# Patient Record
Sex: Female | Born: 1940
Health system: Southern US, Community
[De-identification: ages and names within clinical notes are randomized; demographics above are authoritative.]

## PROBLEM LIST (undated history)

## (undated) DIAGNOSIS — K219 Gastro-esophageal reflux disease without esophagitis: Secondary | ICD-10-CM

## (undated) DIAGNOSIS — K529 Noninfective gastroenteritis and colitis, unspecified: Secondary | ICD-10-CM

## (undated) DIAGNOSIS — I10 Essential (primary) hypertension: Secondary | ICD-10-CM

## (undated) DIAGNOSIS — R112 Nausea with vomiting, unspecified: Secondary | ICD-10-CM

## (undated) DIAGNOSIS — M810 Age-related osteoporosis without current pathological fracture: Secondary | ICD-10-CM

## (undated) DIAGNOSIS — G459 Transient cerebral ischemic attack, unspecified: Secondary | ICD-10-CM

## (undated) DIAGNOSIS — E0590 Thyrotoxicosis, unspecified without thyrotoxic crisis or storm: Secondary | ICD-10-CM

## (undated) DIAGNOSIS — M1990 Unspecified osteoarthritis, unspecified site: Secondary | ICD-10-CM

## (undated) DIAGNOSIS — Z955 Presence of coronary angioplasty implant and graft: Secondary | ICD-10-CM

## (undated) DIAGNOSIS — N6019 Diffuse cystic mastopathy of unspecified breast: Secondary | ICD-10-CM

## (undated) DIAGNOSIS — E7800 Pure hypercholesterolemia, unspecified: Secondary | ICD-10-CM

## (undated) DIAGNOSIS — I2510 Atherosclerotic heart disease of native coronary artery without angina pectoris: Secondary | ICD-10-CM

## (undated) DIAGNOSIS — I219 Acute myocardial infarction, unspecified: Secondary | ICD-10-CM

## (undated) DIAGNOSIS — T8859XA Other complications of anesthesia, initial encounter: Secondary | ICD-10-CM

## (undated) DIAGNOSIS — Z87442 Personal history of urinary calculi: Secondary | ICD-10-CM

## (undated) DIAGNOSIS — E785 Hyperlipidemia, unspecified: Secondary | ICD-10-CM

## (undated) DIAGNOSIS — J3089 Other allergic rhinitis: Secondary | ICD-10-CM

## (undated) DIAGNOSIS — T7840XA Allergy, unspecified, initial encounter: Secondary | ICD-10-CM

## (undated) DIAGNOSIS — R011 Cardiac murmur, unspecified: Secondary | ICD-10-CM

## (undated) DIAGNOSIS — D649 Anemia, unspecified: Secondary | ICD-10-CM

## (undated) DIAGNOSIS — K5792 Diverticulitis of intestine, part unspecified, without perforation or abscess without bleeding: Secondary | ICD-10-CM

## (undated) DIAGNOSIS — Z9889 Other specified postprocedural states: Secondary | ICD-10-CM

## (undated) DIAGNOSIS — E079 Disorder of thyroid, unspecified: Secondary | ICD-10-CM

## (undated) DIAGNOSIS — T4145XA Adverse effect of unspecified anesthetic, initial encounter: Secondary | ICD-10-CM

## (undated) HISTORY — DX: Hyperlipidemia, unspecified: E78.5

## (undated) HISTORY — DX: Gastro-esophageal reflux disease without esophagitis: K21.9

## (undated) HISTORY — DX: Disorder of thyroid, unspecified: E07.9

## (undated) HISTORY — PX: BREAST IMPLANT REMOVAL: SUR1101

## (undated) HISTORY — PX: TRANSUMBILICAL AUGMENTATION MAMMAPLASTY: SUR838

## (undated) HISTORY — DX: Essential (primary) hypertension: I10

## (undated) HISTORY — PX: MASTECTOMY: SHX3

## (undated) HISTORY — DX: Age-related osteoporosis without current pathological fracture: M81.0

## (undated) HISTORY — DX: Allergy, unspecified, initial encounter: T78.40XA

## (undated) HISTORY — PX: VASCULAR SURGERY: SHX849

---

## 1982-05-31 HISTORY — PX: BREAST SURGERY: SHX581

## 1997-05-31 HISTORY — PX: CORONARY ANGIOPLASTY WITH STENT PLACEMENT: SHX49

## 1998-01-31 ENCOUNTER — Ambulatory Visit (HOSPITAL_COMMUNITY): Admission: RE | Admit: 1998-01-31 | Discharge: 1998-01-31 | Payer: Self-pay | Admitting: *Deleted

## 1999-01-30 ENCOUNTER — Ambulatory Visit (HOSPITAL_COMMUNITY): Admission: RE | Admit: 1999-01-30 | Discharge: 1999-01-30 | Payer: Self-pay | Admitting: *Deleted

## 2000-01-29 ENCOUNTER — Ambulatory Visit (HOSPITAL_COMMUNITY): Admission: RE | Admit: 2000-01-29 | Discharge: 2000-01-29 | Payer: Self-pay | Admitting: *Deleted

## 2000-09-16 ENCOUNTER — Encounter: Payer: Self-pay | Admitting: Chiropractic Medicine

## 2000-09-16 ENCOUNTER — Ambulatory Visit (HOSPITAL_COMMUNITY): Admission: RE | Admit: 2000-09-16 | Discharge: 2000-09-16 | Payer: Self-pay | Admitting: Chiropractic Medicine

## 2000-10-26 ENCOUNTER — Encounter: Payer: Self-pay | Admitting: Neurosurgery

## 2000-10-26 ENCOUNTER — Encounter: Admission: RE | Admit: 2000-10-26 | Discharge: 2000-10-26 | Payer: Self-pay | Admitting: Neurosurgery

## 2000-11-14 ENCOUNTER — Encounter: Payer: Self-pay | Admitting: Neurosurgery

## 2000-11-14 ENCOUNTER — Encounter: Admission: RE | Admit: 2000-11-14 | Discharge: 2000-11-14 | Payer: Self-pay | Admitting: Neurosurgery

## 2001-01-27 ENCOUNTER — Ambulatory Visit (HOSPITAL_COMMUNITY): Admission: RE | Admit: 2001-01-27 | Discharge: 2001-01-27 | Payer: Self-pay | Admitting: *Deleted

## 2002-01-26 ENCOUNTER — Ambulatory Visit (HOSPITAL_COMMUNITY): Admission: RE | Admit: 2002-01-26 | Discharge: 2002-01-26 | Payer: Self-pay | Admitting: *Deleted

## 2002-05-31 HISTORY — PX: BREAST ENHANCEMENT SURGERY: SHX7

## 2003-01-25 ENCOUNTER — Ambulatory Visit (HOSPITAL_COMMUNITY): Admission: RE | Admit: 2003-01-25 | Discharge: 2003-01-25 | Payer: Self-pay | Admitting: *Deleted

## 2003-04-09 ENCOUNTER — Other Ambulatory Visit: Payer: Self-pay

## 2003-06-13 ENCOUNTER — Other Ambulatory Visit: Payer: Self-pay

## 2003-08-30 HISTORY — PX: RENAL ARTERY STENT: SHX2321

## 2003-08-30 HISTORY — PX: FEMORAL ARTERY STENT: SHX1583

## 2003-09-18 ENCOUNTER — Ambulatory Visit (HOSPITAL_COMMUNITY): Admission: RE | Admit: 2003-09-18 | Discharge: 2003-09-19 | Payer: Self-pay | Admitting: *Deleted

## 2003-10-30 ENCOUNTER — Ambulatory Visit (HOSPITAL_COMMUNITY): Admission: RE | Admit: 2003-10-30 | Discharge: 2003-10-31 | Payer: Self-pay | Admitting: *Deleted

## 2005-05-31 HISTORY — PX: ABDOMINAL HYSTERECTOMY: SHX81

## 2005-09-08 ENCOUNTER — Ambulatory Visit: Payer: Self-pay | Admitting: Obstetrics and Gynecology

## 2006-05-31 HISTORY — PX: HERNIA REPAIR: SHX51

## 2006-12-09 ENCOUNTER — Ambulatory Visit: Payer: Self-pay | Admitting: Surgery

## 2007-02-08 ENCOUNTER — Ambulatory Visit: Payer: Self-pay | Admitting: Family Medicine

## 2008-10-29 DIAGNOSIS — K219 Gastro-esophageal reflux disease without esophagitis: Secondary | ICD-10-CM | POA: Insufficient documentation

## 2008-10-29 DIAGNOSIS — M810 Age-related osteoporosis without current pathological fracture: Secondary | ICD-10-CM | POA: Insufficient documentation

## 2008-10-29 DIAGNOSIS — I10 Essential (primary) hypertension: Secondary | ICD-10-CM | POA: Insufficient documentation

## 2008-10-29 DIAGNOSIS — D649 Anemia, unspecified: Secondary | ICD-10-CM | POA: Insufficient documentation

## 2008-10-29 DIAGNOSIS — I2510 Atherosclerotic heart disease of native coronary artery without angina pectoris: Secondary | ICD-10-CM | POA: Insufficient documentation

## 2009-01-27 DIAGNOSIS — T859XXA Unspecified complication of internal prosthetic device, implant and graft, initial encounter: Secondary | ICD-10-CM | POA: Insufficient documentation

## 2009-01-27 HISTORY — DX: Unspecified complication of internal prosthetic device, implant and graft, initial encounter: T85.9XXA

## 2009-07-08 ENCOUNTER — Ambulatory Visit: Payer: Self-pay | Admitting: Family Medicine

## 2009-07-14 ENCOUNTER — Ambulatory Visit: Payer: Self-pay | Admitting: Family Medicine

## 2009-07-21 ENCOUNTER — Ambulatory Visit: Payer: Self-pay | Admitting: Family Medicine

## 2009-07-29 ENCOUNTER — Ambulatory Visit: Payer: Self-pay | Admitting: Gastroenterology

## 2009-07-31 ENCOUNTER — Inpatient Hospital Stay: Payer: Self-pay | Admitting: Specialist

## 2009-09-01 ENCOUNTER — Ambulatory Visit: Payer: Self-pay | Admitting: Gastroenterology

## 2009-09-01 LAB — HM COLONOSCOPY

## 2010-02-09 ENCOUNTER — Ambulatory Visit: Payer: Self-pay | Admitting: Gastroenterology

## 2010-02-11 LAB — PATHOLOGY REPORT

## 2010-04-17 ENCOUNTER — Other Ambulatory Visit: Payer: Self-pay | Admitting: Gastroenterology

## 2010-06-16 ENCOUNTER — Emergency Department: Payer: Self-pay | Admitting: Emergency Medicine

## 2010-09-07 ENCOUNTER — Ambulatory Visit: Payer: Self-pay | Admitting: Gastroenterology

## 2010-09-29 ENCOUNTER — Ambulatory Visit: Payer: Self-pay | Admitting: Gastroenterology

## 2011-04-19 LAB — HM DEXA SCAN: HM Dexa Scan: NORMAL

## 2013-02-26 ENCOUNTER — Other Ambulatory Visit: Payer: Self-pay | Admitting: Urgent Care

## 2013-02-26 LAB — COMPREHENSIVE METABOLIC PANEL
Albumin: 4.5 g/dL (ref 3.4–5.0)
Alkaline Phosphatase: 64 U/L (ref 50–136)
Anion Gap: 3 — ABNORMAL LOW (ref 7–16)
BUN: 17 mg/dL (ref 7–18)
Bilirubin,Total: 0.5 mg/dL (ref 0.2–1.0)
Calcium, Total: 9.9 mg/dL (ref 8.5–10.1)
Chloride: 101 mmol/L (ref 98–107)
Co2: 31 mmol/L (ref 21–32)
Creatinine: 0.63 mg/dL (ref 0.60–1.30)
EGFR (African American): >60
EGFR (Non-African Amer.): >60
Glucose: 90 mg/dL (ref 65–99)
Osmolality: 271 (ref 275–301)
Potassium: 3.9 mmol/L (ref 3.5–5.1)
SGOT(AST): 30 U/L (ref 15–37)
SGPT (ALT): 28 U/L (ref 12–78)
Sodium: 135 mmol/L — ABNORMAL LOW (ref 136–145)
Total Protein: 7.8 g/dL (ref 6.4–8.2)

## 2013-02-26 LAB — CBC WITH DIFFERENTIAL/PLATELET
Basophil #: 0.1 10*3/uL (ref 0.0–0.1)
Basophil %: 1 %
Eosinophil #: 0.4 10*3/uL (ref 0.0–0.7)
Eosinophil %: 5.1 %
HCT: 45.6 % (ref 35.0–47.0)
HGB: 15.4 g/dL (ref 12.0–16.0)
Lymphocyte #: 2.3 10*3/uL (ref 1.0–3.6)
Lymphocyte %: 32.3 %
MCH: 32.1 pg (ref 26.0–34.0)
MCHC: 33.7 g/dL (ref 32.0–36.0)
MCV: 95 fL (ref 80–100)
Monocyte #: 0.5 x10 3/mm (ref 0.2–0.9)
Monocyte %: 7 %
Neutrophil #: 3.9 10*3/uL (ref 1.4–6.5)
Neutrophil %: 54.6 %
Platelet: 290 10*3/uL (ref 150–440)
RBC: 4.8 10*6/uL (ref 3.80–5.20)
RDW: 13.4 % (ref 11.5–14.5)
WBC: 7.1 10*3/uL (ref 3.6–11.0)

## 2013-04-02 ENCOUNTER — Ambulatory Visit: Payer: Self-pay | Admitting: Gastroenterology

## 2013-05-29 ENCOUNTER — Ambulatory Visit: Payer: Self-pay | Admitting: Family Medicine

## 2013-11-23 DIAGNOSIS — I2510 Atherosclerotic heart disease of native coronary artery without angina pectoris: Secondary | ICD-10-CM | POA: Insufficient documentation

## 2013-11-23 DIAGNOSIS — I739 Peripheral vascular disease, unspecified: Secondary | ICD-10-CM | POA: Insufficient documentation

## 2013-11-23 DIAGNOSIS — I219 Acute myocardial infarction, unspecified: Secondary | ICD-10-CM | POA: Insufficient documentation

## 2013-11-23 DIAGNOSIS — I252 Old myocardial infarction: Secondary | ICD-10-CM | POA: Insufficient documentation

## 2013-12-03 DIAGNOSIS — I350 Nonrheumatic aortic (valve) stenosis: Secondary | ICD-10-CM | POA: Insufficient documentation

## 2014-03-25 ENCOUNTER — Ambulatory Visit: Payer: Self-pay | Admitting: Vascular Surgery

## 2014-04-15 ENCOUNTER — Ambulatory Visit: Payer: Self-pay | Admitting: Urgent Care

## 2014-04-15 ENCOUNTER — Ambulatory Visit: Payer: Self-pay | Admitting: Internal Medicine

## 2014-05-06 ENCOUNTER — Ambulatory Visit: Payer: Self-pay | Admitting: Vascular Surgery

## 2014-05-06 LAB — CBC
HCT: 41.1 % (ref 35.0–47.0)
HGB: 13.4 g/dL (ref 12.0–16.0)
MCH: 31.8 pg (ref 26.0–34.0)
MCHC: 32.7 g/dL (ref 32.0–36.0)
MCV: 97 fL (ref 80–100)
Platelet: 288 10*3/uL (ref 150–440)
RBC: 4.23 10*6/uL (ref 3.80–5.20)
RDW: 14.4 % (ref 11.5–14.5)
WBC: 6 10*3/uL (ref 3.6–11.0)

## 2014-05-06 LAB — BASIC METABOLIC PANEL
Anion Gap: 9 (ref 7–16)
BUN: 13 mg/dL (ref 7–18)
Calcium, Total: 9.1 mg/dL (ref 8.5–10.1)
Chloride: 100 mmol/L (ref 98–107)
Co2: 30 mmol/L (ref 21–32)
Creatinine: 0.68 mg/dL (ref 0.60–1.30)
EGFR (African American): >60
EGFR (Non-African Amer.): >60
Glucose: 136 mg/dL — ABNORMAL HIGH (ref 65–99)
Osmolality: 280 (ref 275–301)
Potassium: 3.8 mmol/L (ref 3.5–5.1)
Sodium: 139 mmol/L (ref 136–145)

## 2014-05-22 ENCOUNTER — Observation Stay: Payer: Self-pay | Admitting: Internal Medicine

## 2014-05-22 LAB — CBC WITH DIFFERENTIAL/PLATELET
Basophil #: 0 10*3/uL (ref 0.0–0.1)
Basophil %: 0.4 %
Eosinophil #: 0.4 10*3/uL (ref 0.0–0.7)
Eosinophil %: 5.1 %
HCT: 44.4 % (ref 35.0–47.0)
HGB: 14.7 g/dL (ref 12.0–16.0)
Lymphocyte #: 0.6 10*3/uL — ABNORMAL LOW (ref 1.0–3.6)
Lymphocyte %: 7.1 %
MCH: 31.8 pg (ref 26.0–34.0)
MCHC: 33.1 g/dL (ref 32.0–36.0)
MCV: 96 fL (ref 80–100)
Monocyte #: 0.3 x10 3/mm (ref 0.2–0.9)
Monocyte %: 3.8 %
Neutrophil #: 7.1 10*3/uL — ABNORMAL HIGH (ref 1.4–6.5)
Neutrophil %: 83.6 %
Platelet: 228 10*3/uL (ref 150–440)
RBC: 4.63 10*6/uL (ref 3.80–5.20)
RDW: 14.2 % (ref 11.5–14.5)
WBC: 8.5 10*3/uL (ref 3.6–11.0)

## 2014-05-22 LAB — URINALYSIS, COMPLETE
BILIRUBIN, UR: NEGATIVE
Bacteria: NONE SEEN
Glucose,UR: NEGATIVE mg/dL (ref 0–75)
Nitrite: NEGATIVE
Ph: 5 (ref 4.5–8.0)
Specific Gravity: 1.024 (ref 1.003–1.030)
Squamous Epithelial: 6
WBC UR: 8 /HPF (ref 0–5)

## 2014-05-22 LAB — BASIC METABOLIC PANEL
Anion Gap: 9 (ref 7–16)
BUN: 17 mg/dL (ref 7–18)
Calcium, Total: 8.7 mg/dL (ref 8.5–10.1)
Chloride: 97 mmol/L — ABNORMAL LOW (ref 98–107)
Co2: 28 mmol/L (ref 21–32)
Creatinine: 0.83 mg/dL (ref 0.60–1.30)
EGFR (African American): >60
EGFR (Non-African Amer.): >60
Glucose: 121 mg/dL — ABNORMAL HIGH (ref 65–99)
Osmolality: 271 (ref 275–301)
Potassium: 3.4 mmol/L — ABNORMAL LOW (ref 3.5–5.1)
Sodium: 134 mmol/L — ABNORMAL LOW (ref 136–145)

## 2014-05-22 LAB — ED INFLUENZA
H1N1FLUPCR: NOT DETECTED
INFLBPCR: NEGATIVE
Influenza A By PCR: NEGATIVE

## 2014-05-23 LAB — TROPONIN I
Troponin-I: <0.02 ng/mL
Troponin-I: <0.02 ng/mL

## 2014-05-25 LAB — URINE CULTURE

## 2014-05-25 LAB — BETA STREP CULTURE(ARMC)

## 2014-05-27 LAB — LIPID PANEL
Cholesterol: 204 mg/dL — AB (ref 0–200)
HDL: 56 mg/dL (ref 35–70)
LDL CALC: 121 mg/dL
TRIGLYCERIDES: 135 mg/dL (ref 40–160)

## 2014-05-27 LAB — CULTURE, BLOOD (SINGLE)

## 2014-05-27 LAB — BASIC METABOLIC PANEL
BUN: 15 mg/dL (ref 4–21)
Creatinine: 0.7 mg/dL (ref <=1.1)
GLUCOSE: 86 mg/dL
POTASSIUM: 3.7 mmol/L (ref 3.4–5.3)
SODIUM: 141 mmol/L (ref 137–147)

## 2014-05-27 LAB — CBC AND DIFFERENTIAL
HCT: 41 % (ref 36–46)
HEMOGLOBIN: 14.3 g/dL (ref 12.0–16.0)
Platelets: 377 10*3/uL (ref 150–399)
WBC: 7.4 10^3/mL

## 2014-05-27 LAB — HEPATIC FUNCTION PANEL
ALT: 17 U/L (ref 7–35)
AST: 20 U/L (ref 13–35)

## 2014-06-17 ENCOUNTER — Ambulatory Visit: Payer: Self-pay | Admitting: Vascular Surgery

## 2014-06-17 LAB — CBC
HCT: 42 % (ref 35.0–47.0)
HGB: 13.9 g/dL (ref 12.0–16.0)
MCH: 32.1 pg (ref 26.0–34.0)
MCHC: 33.1 g/dL (ref 32.0–36.0)
MCV: 97 fL (ref 80–100)
Platelet: 269 10*3/uL (ref 150–440)
RBC: 4.34 10*6/uL (ref 3.80–5.20)
RDW: 14.3 % (ref 11.5–14.5)
WBC: 5.3 10*3/uL (ref 3.6–11.0)

## 2014-06-17 LAB — BASIC METABOLIC PANEL
ANION GAP: 7 (ref 7–16)
BUN: 11 mg/dL (ref 7–18)
CO2: 31 mmol/L (ref 21–32)
Calcium, Total: 9 mg/dL (ref 8.5–10.1)
Chloride: 101 mmol/L (ref 98–107)
Creatinine: 0.63 mg/dL (ref 0.60–1.30)
EGFR (African American): >60
EGFR (Non-African Amer.): >60
Glucose: 107 mg/dL — ABNORMAL HIGH (ref 65–99)
Osmolality: 277 (ref 275–301)
Potassium: 3.8 mmol/L (ref 3.5–5.1)
Sodium: 139 mmol/L (ref 136–145)

## 2014-06-21 ENCOUNTER — Inpatient Hospital Stay: Payer: Self-pay | Admitting: Vascular Surgery

## 2014-06-21 HISTORY — PX: CAROTID ENDARTERECTOMY: SUR193

## 2014-06-22 LAB — BASIC METABOLIC PANEL
Anion Gap: 8 (ref 7–16)
BUN: 9 mg/dL (ref 7–18)
CHLORIDE: 105 mmol/L (ref 98–107)
CO2: 27 mmol/L (ref 21–32)
CREATININE: 0.65 mg/dL (ref 0.60–1.30)
Calcium, Total: 7.7 mg/dL — ABNORMAL LOW (ref 8.5–10.1)
EGFR (African American): >60
EGFR (Non-African Amer.): >60
Glucose: 92 mg/dL (ref 65–99)
Osmolality: 278 (ref 275–301)
POTASSIUM: 3.5 mmol/L (ref 3.5–5.1)
SODIUM: 140 mmol/L (ref 136–145)

## 2014-06-22 LAB — CBC WITH DIFFERENTIAL/PLATELET
Basophil #: 0 10*3/uL (ref 0.0–0.1)
Basophil %: 0.3 %
Eosinophil #: 0 10*3/uL (ref 0.0–0.7)
Eosinophil %: 0.3 %
HCT: 34.5 % — ABNORMAL LOW (ref 35.0–47.0)
HGB: 11.5 g/dL — ABNORMAL LOW (ref 12.0–16.0)
Lymphocyte #: 1.4 10*3/uL (ref 1.0–3.6)
Lymphocyte %: 16 %
MCH: 32.4 pg (ref 26.0–34.0)
MCHC: 33.2 g/dL (ref 32.0–36.0)
MCV: 98 fL (ref 80–100)
Monocyte #: 0.7 x10 3/mm (ref 0.2–0.9)
Monocyte %: 8.6 %
NEUTROS PCT: 74.8 %
Neutrophil #: 6.4 10*3/uL (ref 1.4–6.5)
Platelet: 232 10*3/uL (ref 150–440)
RBC: 3.54 10*6/uL — AB (ref 3.80–5.20)
RDW: 14.5 % (ref 11.5–14.5)
WBC: 8.6 10*3/uL (ref 3.6–11.0)

## 2014-06-22 LAB — APTT: ACTIVATED PTT: 25.9 s (ref 23.6–35.9)

## 2014-06-22 LAB — PROTIME-INR
INR: 1
Prothrombin Time: 12.9 secs (ref 11.5–14.7)

## 2014-07-29 ENCOUNTER — Ambulatory Visit: Payer: Self-pay | Admitting: Vascular Surgery

## 2014-08-02 ENCOUNTER — Inpatient Hospital Stay: Payer: Self-pay | Admitting: Vascular Surgery

## 2014-08-02 HISTORY — PX: CAROTID ENDARTERECTOMY: SUR193

## 2014-09-21 NOTE — H&P (Signed)
PATIENT NAME:  Misty Page, BALDUCCI MR#:  [id number] DATE OF BIRTH:  07-15-1940  DATE OF ADMISSION:  05/23/2014  PRIMARY CARE DOCTOR:  Jerrell Belfast, MD    REFERRING DOCTOR:  Jon Gills. Reita Cliche, MD    ADMITTING DOCTOR: Juluis Mire, MD   CHIEF COMPLAINT:  1.  Generalized rash with itching all over the body of 1 day duration.  2.  Fever.    HISTORY OF PRESENT ILLNESS: A 74 year old Caucasian female with a past medical history significant for hypertension, coronary artery disease, status post stent, hyperlipidemia, history of renal artery stenosis, status post stenting, and history of ulcerative colitis presents to the Emergency Room with the complaints of generalized rash with itching all over the body of 1 day duration following taking amoxicillin for upper respiratory symptoms. The patient states that she developed some sore throat for which she called her primary care doctor the day before and was given a prescription for amoxicillin of which she took about 3 doses and following which she developed generalized itching with rash and redness involving the face, both upper extremities, trunk and abdomen and lower extremities. Hence, she came to the Emergency Room for further evaluation. The patient also states that she has been having a fever for the past 1 to 2 days. She does have some dry cough with some minimal sputum. No urinary symptoms.   In the Emergency Room on arrival the patient was noted to have a temperature of 102 degrees Fahrenheit and stable vital signs. The patient was evaluated by the ED physician and was given IV Benadryl following which her blood pressure dropped to systolics in the 92J. The patient was given IV fluids following which her blood pressure started improving. As mentioned earlier, the patient has been having some fever and is noted to have a temperature of 102 degrees Fahrenheit. She denies any shortness of breath and no wheezing, no nausea, no vomiting, no diarrhea, no  urinary symptoms such as frequency, urgency, dysuria. Denies any chest pain. No palpitations. No dizziness or loss of consciousness. The patient states that her itchiness and rash are kind of improving after she received IV Benadryl in the Emergency Room and at the current time the patient denies any complaints. She is feeling better. In view of the episode of hypotension and a temperature of 102 degrees Fahrenheit, hospitalist service was consulted for further evaluation and management. Blood cultures and urine culture were sent at  this time and the patient remains stable and the blood pressure is improving with IV fluids.   PAST MEDICAL HISTORY:  1.  Hypertension.  2.  Coronary artery disease, status post stent.  3.  Hyperlipidemia.  4.  Renal artery stenosis, status post stent.  5.  Peripheral artery disease, status post femoral artery stenting.  6.  Ulcerative colitis.   PAST SURGICAL HISTORY:  1.  Status post hysterectomy.  2.  Status post hernia repair.  3.  Bilateral breast implants.   ALLERGIES: NAPROSYN, SHELLFISH, PENICILLIN, LEVAQUIN, CODEINE AND ADHESIVE TAPE.   HOME MEDICATIONS:  1.  Amlodipine 2.5 mg tablet, 1 tablet orally once a day.  2.  Aspirin 81 mg tablet, 1 tablet orally once a day.  3.  Centrum Silver 1 tablet orally once a day.  4.  CoQ-10 10/100 mg 2 capsules orally once a day.  5.  Cranberry oral capsule 500 mg capsule, 1 capsule orally once a day.  6.  Crestor 20 mg tablet, 1 tablet orally once a day.  7.  Famotidine 40 mg tablet, 1 tablet orally once a day.  8.  Ferrous sulfate 45 mg oral tablet extended release, 1 tablet orally once a day.  9.  Fexofenadine 180 mg tablet, 1 tablet orally once a day.  10.  Fish oil capsules 1000 mg 1 capsule orally once a day.  11.  Flonase nasal spray 2 sprays in each nostril once a day.  12.  Hydrochlorothiazide 25 mg tablet, 1 tablet orally once a day.  13.  Losartan potassium 100 mg tablet, 1 tablet orally once a day.   14.  Probiotic formula oral capsule, 1 capsule orally once a day.  15.  Super B-Complex capsule 1 tablet orally once a day.  16.  Vitamin C 1000 mg tablet, 1 tablet orally once a day.   SOCIAL HISTORY: She is married, lives with her husband. She is a [employer] manager. No history of any smoking, alcohol, or substance abuse.    FAMILY HISTORY: Brother has lymphoma and leukemia, father with pancreatic cancer and mother with congestive heart failure.   REVIEW OF SYSTEMS:  CONSTITUTIONAL: Positive for fever of 2 days duration. Negative for generalized weakness or fatigue.  EYES: Negative for blurred vision or double vision. No pain. No redness. No inflammation.  EARS, NOSE, AND THROAT: Negative for tinnitus, ear pain, hearing loss. Positive for sore throat of 1 day duration.    RESPIRATORY: Dry cough present. No wheezing, no hemoptysis, no dyspnea, no painful respirations.  CARDIOVASCULAR: Negative for chest pain, orthopnea. No pedal edema. No palpitations. No syncope or dizziness.  GASTROINTESTINAL: Negative for nausea, vomiting, diarrhea, abdominal pain, hematemesis, GERD symptoms, or rectal bleeding.  GENITOURINARY: Negative for dysuria, hematuria, frequency, or urgency.  ENDOCRINE: Negative for polyuria, nocturia, heat or cold intolerance.  HEMATOLOGIC: Negative for anemia, easy bruising, bleeding.  INTEGUMENTARY: Positive for generalized rash with itching and redness all over the body involving the head, bilateral upper and lower extremities and her torso of one day duration following taking amoxicillin.  MUSCULOSKELETAL: Negative for any arthritis, joint swelling, or gout.  NEUROLOGICAL: Negative for focal weakness, numbness. No history of CVA, TIA,  seizure disorder.  PSYCHIATRIC: Negative for anxiety, insomnia, or depression.   PHYSICAL EXAMINATION:  VITAL SIGNS: On arrival to the Emergency Room, temperature of 101.4 degrees Fahrenheit, pulse rate 114 per minute, respirations 22 per minute,  blood pressure 116/69, O2 saturation is 93% on room air.  GENERAL: Well-developed, well-nourished lady, alert and oriented, not in any acute distress, comfortably lying in the bed.   HEAD: Atraumatic, normocephalic.  EYES: Pupils equal, react to light and accommodation, no conjunctival pallor, no scleral icterus, extraocular movements intact.  NOSE: No nasal lesions. No drainage.  EARS: No drainage. No external lesions.  ORAL CAVITY: No mucosal lesions. No exudates.  NECK: Supple. No JVD. No thyromegaly. No carotid bruit. Range of motion of neck normal.  RESPIRATORY: Good respiratory effort. Not using accessory muscles of respiration. Bilateral vesicular breath sounds present. No rales or rhonchi.  CARDIOVASCULAR: S1, S2 regular. No murmurs, gallops, or clicks appreciated. Peripheral pulses equal at carotid, femoral, and pedal pulses. No peripheral edema.  GASTROINTESTINAL: Abdomen soft, nontender. No hepatosplenomegaly. Bowel sounds present and equal in all 4 quadrants. There is no rebound, no guarding, no rigidity.  GENITOURINARY: Deferred.  MUSCULOSKELETAL: Gait not tested. Range of motion normal in all joints. Strength and tone equal bilaterally.  SKIN INSPECTION:  Generalized erythema present on the face, bilateral upper extremities, torso, and the lower extremities. Rash resolved and  is not apparent at this time.   LYMPHATICS: Negative for cervical lymphadenopathy.  VASCULAR: Good dorsalis pedis and posterior tibial pulses.  NEUROLOGICAL: Alert, awake, and oriented x 3. Cranial nerves II through XII grossly intact. DTRs 2+, symmetrical bilaterally in both upper and lower extremities. Motor strength 5/5 in both upper and lower extremities.  PSYCHIATRIC: Judgment, insight adequate. Alert and oriented x 3. The patient's memory and mood within normal limits.    LABORATORY DATA: Serum glucose 121, BUN 17, creatinine 0.83, sodium 134, potassium 3.4, chloride 97, bicarbonate 28, WBC 8.5, hemoglobin  14.7, hematocrit 44.4, platelet count 228. Urinalysis: WBC 8, nitrite negative, leukocyte esterase trace.   Influenza by PCR A and B negative. H1N1 flu by PCR negative.   IMAGING STUDIES: Chest x-ray:  Hyperexpanded lungs and bronchitic changes without acute cardiopulmonary disease.    EKG: Normal sinus rhythm with ventricular rate of 93 beats per minute.   ASSESSMENT AND PLAN: A 74 year old Caucasian female with a past medical history significant for hypertension, coronary artery disease, status post stent, history of hyperlipidemia, renal artery stenosis, status post stent, ulcerative colitis, and peripheral arterial disease, status post femoral artery stenting, presents to the Emergency Room with the complaints of generalized rash with itching and redness involving all over the body of 1 day duration following taking amoxicillin for fever.  1.  Allergic generalized rash with itching involving over the body. ALLERGIC RASH SECONDARY DUE TO AMOXICILLIN.  Plan:   Admit to med/surg because of episode of hypotension and a fever of 101 degrees Fahrenheit. Benadryl as needed.  Monitor.  2.  Hypotensive episode in the Emergency Room following the administration of Benadryl injection intravenously. The patient received IV fluids in the Emergency Room and the blood pressure improving. Denies any symptoms at this time. All lab work unremarkable. Hypotension  most likely secondary due to medication but however rule out any sepsis to be the contributing factor.  Plan:  Continue IV hydration, monitor blood pressure closely and the blood cultures and urine cultures sent and follow up with cultures.  3.  Fever of 101 degrees Fahrenheit ongoing for the past 1 to 2 days with  upper respiratory infection symptoms. Chest x-ray negative for pneumonia. Most likely viral illness.  Plan:  Monitor, Tylenol p.r.n. Blood cultures and urine cultures and follow up cultures.  4.  Hypertension. The patient on home medications. We  will hold off home blood pressure medications for now because of episode of hypotension. Monitor blood pressure and restart hypertensive medications as needed.  5.  History of coronary artery disease, status post stents in the past. The patient is stable. Cycle cardiac enzymes and monitor.  6.  Hyperlipidemia, on Crestor, continue same.  7.  History of ulcerative colitis. The patient is currently not on any medications except for probiotic. No symptoms at this time. Monitor.  8.  Deep vein thrombosis prophylaxis, subcutaneous Lovenox.  9.  Gastrointestinal prophylaxis, ranitidine.   CODE STATUS: Full code.   TIME SPENT: 55 minutes.   ____________________________ Juluis Mire, MD enr:AT D: 05/23/2014 01:16:00 ET T: 05/23/2014 03:27:51 ET JOB#: [id number]  cc: Juluis Mire, MD, <Dictator> Jerrell Belfast, MD Juluis Mire MD ELECTRONICALLY SIGNED 05/23/2014 20:16

## 2014-09-21 NOTE — Discharge Summary (Signed)
PATIENT NAME:  Misty Page, Misty Page MR#:  [id number] DATE OF BIRTH:  03-23-1941  DATE OF ADMISSION:  05/22/2014 DATE OF DISCHARGE:  05/23/2014  DISCHARGE DIAGNOSES:  1.  Acute bronchitis with underlying chronic obstructive pulmonary disease.  2.  Allergic reaction to amoxicillin.   SECONDARY DIAGNOSES:  1.  Hyperlipidemia.  2.  Coronary artery disease status post stenting.  3.  Hypertension.  4.  Renal artery stenosis status post stenting.  5.  Peripheral arterial disease.  6.  Ulcerative colitis.   CONSULTATIONS: None.   PROCEDURES AND RADIOLOGY:  Chest x-ray on December 23 showed no acute cardiopulmonary disease. Hyperexpanded lungs.   Major laboratory panel, UA on admission showed no bacteria, 8 WBCs, trace leukocyte esterase.   Urine culture grew 10,000 colonies of Enterococcus faecalis.   Blood cultures x 2 were negative. Beta-hemolytic strep was negative.   Influenza was negative.   HISTORY AND SHORT HOSPITAL COURSE: The patient is a 74 year old female with the above-mentioned medical problems, who was admitted for generalized rash with itching thought to be secondary to amoxicillin, maybe with some component of acute bronchitis. Please see Dr. Reddy's dictated history and physical for further details. The patient was started on Benadryl, stopped her amoxicillin, and she had improved significantly. She was also started on steroid taper for her COPD exacerbation, was feeling much better later in the day of December 24, and was discharged home in stable condition.   VITAL SIGNS: On the date of discharge her vital signs are as follows: Temperature 97.9, heart rate 78 per minute, respirations 20 per minute, blood pressure 115/70. She was saturating 96% on room air.   PERTINENT PHYSICAL EXAMINATION ON THE DATE OF DISCHARGE:   CARDIOVASCULAR: S1, S2 normal. No murmurs, rubs, gallops.   LUNGS: Clear to auscultation bilaterally. No wheezing, rales, rhonchi, or crepitation.  ABDOMEN: Soft,  benign.  NEUROLOGIC: Nonfocal examination.  SKIN: She did not have any rash on my examination when I was discharging her, although she did report some erythema when she was admitted especially on the face, upper extremity, along with lower extremity, which all seem to have been resolved by discharge.   All other physical examination remained at baseline.   DISCHARGE MEDICATIONS:   Medication Instructions  super b complex vitamin b complex oral tablet  1 tab(s) orally once a day   co q-10 100 mg oral capsule  2 cap(s) orally once a day   ferrous sulfate (as elemental iron) 45 mg oral tablet, extended release  1 tab(s) orally once a day   probiotic formula - oral capsule  1 cap(s) orally once a day   vitamin c 1000 mg oral tablet  1 tab(s) orally once a day   cranberry - oral capsule 547m  1 cap(s) orally once a day   fish oil 1000 mg oral capsule   orally once a day   losartan potassium 100 mg  1 cap(s) orally once a day   aspirin low dose 81 mg oral delayed release tablet  1 tab(s) orally once a day   crestor 20 mg oral tablet  1 tab(s) orally once a day (at bedtime)   flonase 50 mcg/inh nasal spray  2 spray(s) nasal once a day   centrum silver antioxidant multiple vitamins and minerals oral tablet  1 tab(s) orally once a day   famotidine 40 mg oral tablet   orally once a day am   fexofenadine 180 mg oral tablet  1 tab(s) orally once a day  amlodipine 2.5 mg oral tablet  1 tab(s) orally once a day   hydrochlorothiazide 25 mg oral tablet  1 tab(s) orally once a day   advair diskus 250 mcg-50 mcg inhalation powder  1 puff(s) inhaled 2 times a day   prednisone 10 mg oral tablet  Start at 60 mg and taper by 10 mg daily until complete   albuterol cfc free 90 mcg/inh inhalation aerosol  2 puff(s) inhaled 4 times a day, As Needed - for Shortness of Breath and or cough     DISCHARGE DIET: Low sodium.   DISCHARGE ACTIVITY: As tolerated.   DISCHARGE INSTRUCTIONS AND FOLLOWUP: The patient  was instructed to follow up with her primary care physician Dr. Margarita Rana in 1-2 weeks. She will need to follow with Dr. Hortencia Pilar in 2-4 weeks. She was instructed not to use amoxicillin and put that as an allergy listed.   TOTAL TIME DISCHARGING THIS PATIENT: 40 minutes.    ____________________________ Lucina Mellow. Manuella Ghazi, MD vss:bu D: 05/27/2014 20:03:09 ET T: 05/27/2014 20:41:49 ET JOB#: [id number]  cc: Cherity Blickenstaff S. Manuella Ghazi, MD, <Dictator> Jerrell Belfast, MD Katha Cabal, MD Lucina Mellow [facility] MD ELECTRONICALLY SIGNED 05/28/2014 12:30

## 2014-09-23 LAB — SURGICAL PATHOLOGY

## 2014-09-29 NOTE — Op Note (Signed)
PATIENT NAME:  Misty Page, Misty Page MR#:  [id number] DATE OF BIRTH:  December 31, 1940  DATE OF PROCEDURE:  06/21/2014  PREOPERATIVE DIAGNOSIS:  Critical stenosis, bilateral carotid artery.   POSTOPERATIVE DIAGNOSIS: Critical stenosis, bilateral carotid artery.   PROCEDURE PERFORMED:  Left carotid endarterectomy with CorMatrix patch angioplasty.   SURGEON: Katha Cabal, MD   ANESTHESIA: General by endotracheal intubation.   FLUIDS: Per anesthesia record.   ESTIMATED BLOOD LOSS: 100 mL.   SPECIMEN: Plaque to pathology for permanent section.   INDICATIONS: Ms. Roesler is a 74 year old woman who presented to the office and was found to have bilateral critical stenosis of the carotid arteries. She is right-hand dominant and therefore the left carotid is being treated first.  Risks and benefits are reviewed.  Alternative therapies are discussed. All questions are answered. The patient has agreed to proceed with surgery.   DESCRIPTION OF PROCEDURE: The patient is taken to the Operating Room and placed in the supine position. After adequate general anesthesia is induced and appropriate invasive monitors are placed, she is positioned supine with her neck extended slightly and rotated to the right.  Neck and chest wall on the left side are prepped and draped in a sterile fashion. Appropriate timeout is called.   A curvilinear incision is created along the anterior margin of the sternocleidomastoid muscle and carried down through the soft tissues, platysma is transected. The external jugular vein is ligated and divided between 2-0 silk ties. The omohyoid is then identified after reflecting the sternocleidomastoid muscle posterolaterally and the common carotid is localized at this level. Dissection is then carried along the anterior margin of the common carotid in a craniad direction identifying both the vagus and the hypoglossal nerves. Vessel loops are passed around the superior thyroid and the external carotid  artery.  Internal carotid artery is dissected circumferentially at a level above the visible plaque formation. Then, 7000 units of heparin is given.   The common, followed by the external, followed by the internal carotid artery is clamped. Arteriotomy is made with an 11 blade and extended with Potts scissors, and an indwelling shunt is placed without difficulty.  Flow was re-established to the brain.   Endarterectomy is then performed, beginning in the internal carotid artery and extending it down through the bulb to the distal common carotid artery under direct visualization. The external carotid artery was treated with the eversion technique. Interrupted 7-0 Prolene sutures were used to tack the distal intimal edge within the internal carotid artery and a CorMatrix patch is rehydrated on the back table and then applied to repair the arterial defect using a 4-quadrant technique with running 6-0 Prolene. Copious flushing maneuvers are performed and the suture line is completed after the shunt is removed.  Flow was then re-established, first to the external carotid artery and then the internal carotid artery to prevent distal embolization.   The wound is then irrigated and inspected for hemostasis, and subsequently the platysma is reapproximated with running 3-0 Vicryl and the skin is closed with 4-0 Monocryl subcuticular and Dermabond is applied. The patient tolerated the procedure well. There were no immediate complications. Sponge and needle counts were correct, and she is taken to recovery room in stable condition.     ____________________________ Katha Cabal, MD ggs:DT D: 06/21/2014 10:24:33 ET T: 06/21/2014 11:07:37 ET JOB#: [id number]  cc: Katha Cabal, MD, <Dictator> Katha Cabal MD ELECTRONICALLY SIGNED 07/03/2014 17:43

## 2014-09-29 NOTE — Op Note (Signed)
PATIENT NAME:  KENZEY, BIRKLAND MR#:  [id number] DATE OF BIRTH:  1941/03/29  DATE OF PROCEDURE:  08/02/2014  PREOPERATIVE DIAGNOSIS: Asymptomatic critical stenosis of the right internal carotid artery.   POSTOPERATIVE DIAGNOSIS: Asymptomatic critical stenosis of the right internal carotid artery.   PROCEDURE PERFORMED: Right carotid endarterectomy with primary closure.   SURGEON: Hortencia Pilar, MD  ANESTHESIA: General by endotracheal intubation.   FLUIDS: Per anesthesia record.   ESTIMATED BLOOD LOSS: 100 mL.   SPECIMEN: Carotid plaque to pathology for permanent section.   INDICATIONS: Ms. Bartek is a 74 year old woman who underwent left carotid endarterectomy approximately 6 weeks ago. She now is returning for treatment of her right carotid stenosis. The risks and benefits are reviewed. All questions answered. The patient has agreed to proceed.   DESCRIPTION OF PROCEDURE: The patient is taken to the operating room and placed in the supine position. After adequate general anesthesia is induced, appropriate invasive monitors are placed, she is positioned supine with her neck extended and rotated to the left. Neck and chest wall are prepped and draped in sterile fashion. Appropriate timeout is called.   A curvilinear incision was made along the anterior margin of the sternocleidomastoid muscle and carried down through the soft tissues transecting the platysma and ligating the external jugular vein and several other tributaries between 3-0 silk ties. Sternocleidomastoid muscle is then reflected posterolaterally and the omohyoid muscle is identified. At this level, the vagus nerve is noted to be anterior and medial to the carotid, overlying the common carotid as it approaches the bulb. It is rotated to its more normal anatomic location lateral and posterior to the internal carotid artery. Therefore, the vagus is dissected free and rotated to the common carotids lateral posterior side. External  carotid artery is looped as is the superior thyroid. Internal carotid artery is dissected circumferentially above the level of plaque formation and is noted to be rather generous in size with the plaque ending in an area that appears to be at least 6 to 7 mm in diameter.   Then 7000 units of heparin is given and allowed to circulate. The common followed by the external followed by the internal carotid arteries are then clamped. Arteriotomy is made, extended with Potts scissors, and an indwelling Sundt shunt is placed without difficulty. There are no immediate issues with the shunt and endarterectomy is then performed under direct visualization for the common bulb and internal. The external carotid artery is treated with the eversion technique.   Because of the size of the internal carotid artery, primary closure is elected. The distal intimal edge is tacked with 4 interrupted 7-0 Prolene sutures and then primary closure using running 6-0 Prolene is performed. The shunt is removed, flushing maneuvers are performed, suture line is completed and flow is re-established, first to the external carotid artery and then the internal carotid artery to prevent distal embolization. The suture line is inspected for hemostasis. A single 7-0 Prolene is used to treatment 1 area. Evicel and Surgicel is then placed. The wound is then closed using 3-0 Vicryl in a running fashion for the platysma and 4-0 Monocryl for the skin. Dermabond is applied. The patient tolerated the procedure well. There were no immediate complications. Sponge and needle counts were correct, and she was awakened in the operating room moving all extremities and obeying simple commands and taken to recovery in stable condition.  ____________________________ Katha Cabal, MD ggs:sb D: 08/02/2014 10:05:09 ET T: 08/02/2014 11:50:02 ET JOB#: [id number]  cc: Katha Cabal, MD, <Dictator> Katha Cabal MD ELECTRONICALLY SIGNED 08/06/2014 14:28

## 2014-10-15 DIAGNOSIS — E0590 Thyrotoxicosis, unspecified without thyrotoxic crisis or storm: Secondary | ICD-10-CM | POA: Insufficient documentation

## 2014-10-15 DIAGNOSIS — J302 Other seasonal allergic rhinitis: Secondary | ICD-10-CM | POA: Insufficient documentation

## 2014-10-15 DIAGNOSIS — K5190 Ulcerative colitis, unspecified, without complications: Secondary | ICD-10-CM | POA: Insufficient documentation

## 2014-10-15 DIAGNOSIS — R011 Cardiac murmur, unspecified: Secondary | ICD-10-CM | POA: Insufficient documentation

## 2014-10-15 DIAGNOSIS — C4490 Unspecified malignant neoplasm of skin, unspecified: Secondary | ICD-10-CM

## 2014-10-15 DIAGNOSIS — N200 Calculus of kidney: Secondary | ICD-10-CM | POA: Insufficient documentation

## 2014-10-15 DIAGNOSIS — B029 Zoster without complications: Secondary | ICD-10-CM | POA: Insufficient documentation

## 2014-10-15 DIAGNOSIS — Z9889 Other specified postprocedural states: Secondary | ICD-10-CM | POA: Insufficient documentation

## 2014-10-15 DIAGNOSIS — Z86010 Personal history of colonic polyps: Secondary | ICD-10-CM | POA: Insufficient documentation

## 2014-10-15 DIAGNOSIS — G4700 Insomnia, unspecified: Secondary | ICD-10-CM | POA: Insufficient documentation

## 2014-10-15 DIAGNOSIS — K529 Noninfective gastroenteritis and colitis, unspecified: Secondary | ICD-10-CM | POA: Insufficient documentation

## 2014-10-15 HISTORY — DX: Calculus of kidney: N20.0

## 2014-10-15 HISTORY — DX: Unspecified malignant neoplasm of skin, unspecified: C44.90

## 2014-12-16 ENCOUNTER — Encounter: Payer: Self-pay | Admitting: Family Medicine

## 2014-12-16 ENCOUNTER — Ambulatory Visit (INDEPENDENT_AMBULATORY_CARE_PROVIDER_SITE_OTHER): Payer: PPO | Admitting: Family Medicine

## 2014-12-16 VITALS — BP 130/72 | HR 80 | Temp 98.7°F | Resp 20 | Ht 61.0 in | Wt 136.0 lb

## 2014-12-16 DIAGNOSIS — D649 Anemia, unspecified: Secondary | ICD-10-CM | POA: Diagnosis not present

## 2014-12-16 DIAGNOSIS — E7800 Pure hypercholesterolemia, unspecified: Secondary | ICD-10-CM

## 2014-12-16 DIAGNOSIS — I10 Essential (primary) hypertension: Secondary | ICD-10-CM | POA: Diagnosis not present

## 2014-12-16 DIAGNOSIS — E0590 Thyrotoxicosis, unspecified without thyrotoxic crisis or storm: Secondary | ICD-10-CM | POA: Diagnosis not present

## 2014-12-16 NOTE — Progress Notes (Signed)
Subjective:    Patient ID: Misty Page, female    DOB: Jul 26, 1940, 74 y.o.   MRN: [id number]  HPI  Hyperthyroidism: Patient presents with hyperthyroidism. Symptoms include none.  The patient denies drug abuse. The patient reports tender neck / sore throat. Patient reports the pain is related to carotid surgery on 06/21/14 and 08/02/14 by Dr. Delana Meyer. Prior studies include TSH.  Family history includes no thyroid abnormalities. Patient was given a lab order on 06/07/2014 to have TSH rechecked, pt reports that due to surgery she forgot to have drawn.   Review of Systems  Constitutional: Negative.   HENT: Negative.   Respiratory: Negative.   Cardiovascular: Negative.   Endocrine: Negative.   Musculoskeletal: Positive for neck pain.  Psychiatric/Behavioral: Negative.     Patient Active Problem List   Diagnosis Date Noted  . Allergic rhinitis, seasonal 10/15/2014  . Cardiac murmur 10/15/2014  . Colitis 10/15/2014  . H/O surgical procedure 10/15/2014  . History of colon polyps 10/15/2014  . Hyperthyroidism 10/15/2014  . Cannot sleep 10/15/2014  . Calculus of kidney 10/15/2014  . Herpes zona 10/15/2014  . CA of skin 10/15/2014  . Colitis gravis 10/15/2014  . Aortic heart valve narrowing 12/03/2013  . Arteriosclerosis of coronary artery 11/23/2013  . Heart attack 11/23/2013  . Diverticulitis of colon 07/21/2009  . Complication of internal prosthetic device 01/27/2009  . Hemorrhoids without complication 35/00/9381  . Peripheral blood vessel disorder 01/27/2009  . Current tobacco use 01/27/2009  . Absolute anemia 10/29/2008  . Atherosclerosis of coronary artery 10/29/2008  . Essential (primary) hypertension 10/29/2008  . Acid reflux 10/29/2008  . Hypercholesteremia 10/29/2008  . OP (osteoporosis) 10/29/2008  . Healed myocardial infarct 05/31/1997   Past Medical History  Diagnosis Date  . Thyroid disease   . Hypertension   . Allergy   . GERD (gastroesophageal reflux  disease)   . Osteoporosis   . Hyperlipidemia    Current Outpatient Prescriptions on File Prior to Visit  Medication Sig  . Ascorbic Acid (VITAMIN C) 1000 MG tablet Take 1,000 mg by mouth daily.   Marland Kitchen aspirin 81 MG tablet Take 81 mg by mouth daily. ASPIRIN, 81MG (Oral Tablet)  1 Every Day for 0 days  Quantity: 0.00;  Refills: 0   Ordered :13-Apr-2010  Celene Kras, MA, Anastasiya ;  Started 29-October-2008 Active Comments: DX: 414.00  . B COMPLEX VITAMINS PO Take 1 tablet by mouth daily. B COMPLEX (Oral Capsule)  1 Every Day for 0 days  Quantity: 0.00;  Refills: 0   Ordered :13-Apr-2010  Celene Kras, MA, Anastasiya ;  Started 29-October-2008 Active Comments: DX: 414.00  . Calcium-Vitamin D-Vitamin K 500-100-40 MG-UNT-MCG CHEW Chew 1 tablet by mouth 2 (two) times daily. VIACTIV, 500-100-40 (Oral Tablet Chewable)  1 tablet twice a day for 0 days  Quantity: 0.00;  Refills: 0   Ordered :13-Apr-2010  Celene Kras, MA, Anastasiya ;  Started 29-October-2008 Active Comments: DX: 733.00  . Coenzyme Q10 (CO Q 10) 100 MG CAPS Take 1 capsule by mouth daily.   . Cranberry 300 MG tablet Take 300 mg by mouth daily.   . famotidine (PEPCID) 40 MG tablet Take 40 mg by mouth daily.   . ferrous sulfate 325 (65 FE) MG EC tablet Take 325 mg by mouth daily with breakfast. FERROUS SULFATE, 325 (65 Fe)MG (Oral Tablet Delayed Release)  1 every day for 0 days  Quantity: 0.00;  Refills: 0   Ordered :13-Apr-2010  Celene Kras, MA, Anastasiya ;  Started 29-October-2008 Active Comments:  DX: 285.9  . fexofenadine (ALLEGRA) 180 MG tablet Take 180 mg by mouth daily.   . fluticasone (FLONASE) 50 MCG/ACT nasal spray Place 2 sprays into the nose daily.   . hydrochlorothiazide (HYDRODIURIL) 25 MG tablet Take 25 mg by mouth daily.   Marland Kitchen losartan (COZAAR) 100 MG tablet Take 100 mg by mouth daily.   . montelukast (SINGULAIR) 10 MG tablet Take 10 mg by mouth at bedtime.   . MULTIPLE VITAMIN PO Take 1 tablet by mouth daily. MULTIVITAMINS (Oral Tablet)  1  Every Day for 0 days  Quantity: 0.00;  Refills: 0   Ordered :13-Apr-2010  Celene Kras, MA, Anastasiya ;  Started 29-October-2008 Active Comments: DX: 414.00  . Omega-3 Fatty Acids (FISH OIL) 1000 MG CAPS Take 1 capsule by mouth daily. FISH OIL CONCENTRATE, 1000MG (Oral Capsule)  1 Every Day for 0 days  Quantity: 0.00;  Refills: 0   Ordered :13-Apr-2010  Celene Kras, MA, Anastasiya ;  Started 29-October-2008 Active Comments: DX: 414.00  . Probiotic Product (PROBIOTIC DAILY PO) Take 1 capsule by mouth daily. PROBIOTIC PRODUCT (Oral Tablet)  1 Every Day for 0 days  Quantity: 0.00;  Refills: 0   Ordered :13-Apr-2010  Ashley Royalty ;  Started 29-Dec-2009 Active  . rosuvastatin (CRESTOR) 20 MG tablet Take 20 mg by mouth daily.   Marland Kitchen sulfaSALAzine (AZULFIDINE) 500 MG tablet Take 500 mg by mouth 2 (two) times daily.    No current facility-administered medications on file prior to visit.   Allergies  Allergen Reactions  . Codeine   . Levofloxacin   . Naproxen   . Penicillins     Rash  . Shellfish Allergy    Past Surgical History  Procedure Laterality Date  . Carotid endarterectomy Left 06/21/2014    Dr. Delana Meyer  . Carotid endarterectomy Right 08/02/2014    Dr. Delana Meyer  . Hernia repair  2008  . Abdominal hysterectomy  2007  . Breast enhancement surgery  2004  . Breast surgery  1984    Fibrocystic Disease  . Femoral artery stent  08/2003  . Renal artery stent  08/2003  . Coronary angioplasty with stent placement  1999   History   Social History  . Marital Status: Married    Spouse Name: Konrad Dolores  . Number of Children: 1  . Years of Education: N/A   Occupational History  . Not on file.   Social History Main Topics  . Smoking status: Former Research scientist (life sciences)  . Smokeless tobacco: Never Used  . Alcohol Use: No  . Drug Use: No  . Sexual Activity: Not on file   Other Topics Concern  . Not on file   Social History Narrative   Family History  Problem Relation Age of Onset  . Hyperlipidemia Mother   .  Congestive Heart Failure Mother   . Pancreatic cancer Father   . Arthritis Sister   . Alzheimer's disease Sister   . Prostate cancer Brother   . Heart attack Brother   . Stomach cancer Brother   . Kidney failure Brother   . Leukemia Brother   . Emphysema Brother        Objective:   Physical Exam  Constitutional: She is oriented to person, place, and time. She appears well-developed and well-nourished.  Cardiovascular: Normal rate and regular rhythm.   Murmur heard. Pulmonary/Chest: Effort normal and breath sounds normal.  Neurological: She is alert and oriented to person, place, and time.  Psychiatric: She has a normal mood and affect. Her behavior is  normal. Judgment and thought content normal.    BP 130/72 mmHg  Pulse 80  Temp(Src) 98.7 F (37.1 C) (Oral)  Resp 20  Ht 5' 1"  (1.549 m)  Wt 136 lb (61.689 kg)  BMI 25.71 kg/m2      Assessment & Plan:  1. Hyperthyroidism Will recheck labs.   - TSH  2. Essential (primary) hypertension Stable. Continue current medication. Has done very well after carotid endarterectomy.  - Comprehensive metabolic panel - CBC with Differential/Platelet  3. Anemia, unspecified anemia type Will check labs today.   4. Hypercholesteremia Will check labs.  Continue current medication.  Tolerating medication without difficulty.   - Lipid panel  Margarita Rana, MD

## 2014-12-17 ENCOUNTER — Other Ambulatory Visit: Payer: Self-pay | Admitting: Family Medicine

## 2014-12-17 LAB — CBC WITH DIFFERENTIAL/PLATELET
Basophils Absolute: 0 10*3/uL (ref 0.0–0.2)
Basos: 0 %
EOS (ABSOLUTE): 0.1 10*3/uL (ref 0.0–0.4)
EOS: 3 %
Hematocrit: 40.5 % (ref 34.0–46.6)
Hemoglobin: 13.6 g/dL (ref 11.1–15.9)
IMMATURE GRANS (ABS): 0 10*3/uL (ref 0.0–0.1)
Immature Granulocytes: 0 %
Lymphocytes Absolute: 1.4 10*3/uL (ref 0.7–3.1)
Lymphs: 29 %
MCH: 31.7 pg (ref 26.6–33.0)
MCHC: 33.6 g/dL (ref 31.5–35.7)
MCV: 94 fL (ref 79–97)
MONOS ABS: 0.5 10*3/uL (ref 0.1–0.9)
Monocytes: 11 %
Neutrophils Absolute: 2.7 10*3/uL (ref 1.4–7.0)
Neutrophils: 57 %
Platelets: 313 10*3/uL (ref 150–379)
RBC: 4.29 x10E6/uL (ref 3.77–5.28)
RDW: 13.3 % (ref 12.3–15.4)
WBC: 4.7 10*3/uL (ref 3.4–10.8)

## 2014-12-18 ENCOUNTER — Telehealth: Payer: Self-pay

## 2014-12-18 LAB — COMPREHENSIVE METABOLIC PANEL
ALBUMIN: 4.3 g/dL (ref 3.5–4.8)
ALK PHOS: 56 IU/L (ref 39–117)
ALT: 14 IU/L (ref 0–32)
AST: 19 IU/L (ref 0–40)
Albumin/Globulin Ratio: 2 (ref 1.1–2.5)
BILIRUBIN TOTAL: 0.2 mg/dL (ref 0.0–1.2)
BUN / CREAT RATIO: 21 (ref 11–26)
BUN: 14 mg/dL (ref 8–27)
CALCIUM: 9.6 mg/dL (ref 8.7–10.3)
CHLORIDE: 98 mmol/L (ref 97–108)
CO2: 26 mmol/L (ref 18–29)
Creatinine, Ser: 0.66 mg/dL (ref 0.57–1.00)
GFR calc Af Amer: 101 mL/min/{1.73_m2} (ref >59)
GFR calc non Af Amer: 88 mL/min/{1.73_m2} (ref >59)
GLOBULIN, TOTAL: 2.2 g/dL (ref 1.5–4.5)
Glucose: 88 mg/dL (ref 65–99)
POTASSIUM: 4.3 mmol/L (ref 3.5–5.2)
SODIUM: 140 mmol/L (ref 134–144)
Total Protein: 6.5 g/dL (ref 6.0–8.5)

## 2014-12-18 LAB — LIPID PANEL
CHOLESTEROL TOTAL: 200 mg/dL — AB (ref 100–199)
Chol/HDL Ratio: 2.7 ratio units (ref 0.0–4.4)
HDL: 75 mg/dL (ref >39)
LDL CALC: 106 mg/dL — AB (ref 0–99)
Triglycerides: 94 mg/dL (ref 0–149)

## 2014-12-18 LAB — TSH: TSH: 0.691 u[IU]/mL (ref 0.450–4.500)

## 2014-12-18 NOTE — Telephone Encounter (Signed)
-----  Message from Margarita Rana, MD sent at 12/18/2014  8:56 AM EDT ----- Think I must have messed up this order. There are other labs like met c, tsh and lipid that did not come thru. Thanks.

## 2014-12-18 NOTE — Telephone Encounter (Signed)
[facility] is going to fax the rest of the labs.   Thanks,   -Mickel Baas

## 2014-12-19 NOTE — Telephone Encounter (Signed)
LMTCB 12/19/2014  Thanks,   -Mickel Baas

## 2014-12-19 NOTE — Telephone Encounter (Signed)
-----   Message from Margarita Rana, MD sent at 12/19/2014  1:29 PM EDT ----- Labs stable. Please notify patient.  Thanks.

## 2014-12-20 ENCOUNTER — Telehealth: Payer: Self-pay | Admitting: Family Medicine

## 2014-12-20 NOTE — Telephone Encounter (Signed)
Advised pt of lab work.   Thanks,   -Mickel Baas

## 2014-12-20 NOTE — Telephone Encounter (Signed)
Pt stating she is returning a call about results.CB# [phone]. CC

## 2014-12-20 NOTE — Telephone Encounter (Signed)
Pt advised as directed below.   Thanks,   -Mickel Baas

## 2015-03-22 ENCOUNTER — Ambulatory Visit: Payer: PPO

## 2015-05-19 ENCOUNTER — Other Ambulatory Visit: Payer: Self-pay | Admitting: Family Medicine

## 2015-05-19 DIAGNOSIS — I10 Essential (primary) hypertension: Secondary | ICD-10-CM

## 2015-06-09 ENCOUNTER — Encounter: Payer: PPO | Admitting: Family Medicine

## 2015-06-10 ENCOUNTER — Ambulatory Visit (INDEPENDENT_AMBULATORY_CARE_PROVIDER_SITE_OTHER): Payer: PPO | Admitting: Family Medicine

## 2015-06-10 ENCOUNTER — Encounter: Payer: Self-pay | Admitting: Family Medicine

## 2015-06-10 VITALS — BP 130/74 | HR 64 | Temp 97.8°F | Resp 16 | Ht 61.5 in | Wt 141.0 lb

## 2015-06-10 DIAGNOSIS — Z0000 Encounter for general adult medical examination without abnormal findings: Secondary | ICD-10-CM

## 2015-06-10 DIAGNOSIS — J018 Other acute sinusitis: Secondary | ICD-10-CM | POA: Diagnosis not present

## 2015-06-10 DIAGNOSIS — I10 Essential (primary) hypertension: Secondary | ICD-10-CM

## 2015-06-10 DIAGNOSIS — K529 Noninfective gastroenteritis and colitis, unspecified: Secondary | ICD-10-CM | POA: Diagnosis not present

## 2015-06-10 DIAGNOSIS — M25551 Pain in right hip: Secondary | ICD-10-CM

## 2015-06-10 MED ORDER — CEFDINIR 300 MG PO CAPS: 300.0000 mg | ORAL_CAPSULE | Freq: Two times a day (BID) | ORAL | Status: DC

## 2015-06-10 MED ORDER — ROSUVASTATIN CALCIUM 20 MG PO TABS: 20.0000 mg | ORAL_TABLET | Freq: Every day | ORAL | Status: DC

## 2015-06-10 MED ORDER — SULFASALAZINE 500 MG PO TABS: 500.0000 mg | ORAL_TABLET | Freq: Three times a day (TID) | ORAL | Status: DC

## 2015-06-10 MED ORDER — HYDROCHLOROTHIAZIDE 25 MG PO TABS: 25.0000 mg | ORAL_TABLET | Freq: Every day | ORAL | Status: DC

## 2015-06-10 MED ORDER — FLUTICASONE PROPIONATE 50 MCG/ACT NA SUSP: 2.0000 | Freq: Every day | NASAL | Status: DC

## 2015-06-10 MED ORDER — LOSARTAN POTASSIUM 100 MG PO TABS: 100.0000 mg | ORAL_TABLET | Freq: Every day | ORAL | Status: DC

## 2015-06-10 MED ORDER — MONTELUKAST SODIUM 10 MG PO TABS: 10.0000 mg | ORAL_TABLET | Freq: Every day | ORAL | Status: DC

## 2015-06-10 MED ORDER — FAMOTIDINE 40 MG PO TABS: 40.0000 mg | ORAL_TABLET | Freq: Every day | ORAL | Status: DC

## 2015-06-10 NOTE — Patient Instructions (Signed)
Please start taking Crestor 3 days a week only.

## 2015-06-10 NOTE — Progress Notes (Signed)
Patient ID: Misty Page, female   DOB: Apr 23, 1941, 75 y.o.   MRN: [id number]       Patient: Misty Page, Female    DOB: 12-Jun-1940, 75 y.o.   MRN: [id number] Visit Date: 06/10/2015  Today's Provider: Margarita Rana, MD   Chief Complaint  Patient presents with  . Medicare Wellness  . Sinusitis  . Hip Pain   Subjective:    Annual wellness visit Misty Page is a 75 y.o. female. She feels fairly well. She reports exercising 3 times a week bowling. She reports she is sleeping well.  05/24/14 CPE 04/19/11 Pap-neg 09/01/09 Colon-diverticulosis 04/19/11 BMD-normal  Lab Results  Component Value Date   WBC 4.7 12/17/2014   HGB 11.5* 06/22/2014   HCT 40.5 12/17/2014   PLT 313 12/17/2014   GLUCOSE 88 12/17/2014   CHOL 200* 12/17/2014   TRIG 94 12/17/2014   HDL 75 12/17/2014   LDLCALC 106* 12/17/2014   ALT 14 12/17/2014   AST 19 12/17/2014   NA 140 12/17/2014   K 4.3 12/17/2014   CL 98 12/17/2014   CREATININE 0.66 12/17/2014   BUN 14 12/17/2014   CO2 26 12/17/2014   TSH 0.691 12/17/2014   INR 1.0 06/22/2014   ---------------------------------------------------------------------------------------------------------------------------------  Upper Respiratory Infection: Patient complains of symptoms of a URI, possible sinusitis. Symptoms include cough and post nasal drainage. Onset of symptoms was 2-3 weeks ago, stable since that time. She also c/o productive cough with  green colored sputum for the past 5 days . Thinks she now has a sinus infection.   She is drinking plenty of fluids. Evaluation to date: none. Treatment to date: none. Patient reports cough is worse at night.    ------------------------------------------------------------------------------------------------------------------------------------ Hip Pain: Patient complains of right hip pain. Onset of the symptoms was several years ago. Patient would like to know if hormone therapy would help with hip pain. Has pain  from her buttocks that radiates down her leg. Also, with some aching.  Concerned it is related to her statin.  Has increased her Co Q10 and is scheduled to see her chiropractor Friday.     Colitis: Has been worsening over the past few weeks. She has intermittent constipation, diarrhea and abdominal pain. Consistent with previous flare of her colitis.  Is taking her medication regularly. Does not want to see Dr. Speagle Norris.    Review of Systems  Constitutional: Negative.   HENT: Positive for congestion, postnasal drip and sinus pressure.   Eyes: Negative.   Respiratory: Positive for cough.   Cardiovascular: Negative.   Gastrointestinal: Positive for abdominal pain, diarrhea, constipation and abdominal distention. Negative for vomiting and blood in stool.  Endocrine: Negative.   Genitourinary: Negative.   Musculoskeletal: Positive for arthralgias.  Skin: Negative.   Allergic/Immunologic: Positive for environmental allergies.  Neurological: Negative.   Hematological: Negative.   Psychiatric/Behavioral: Negative.     Social History   Social History  . Marital Status: Married    Spouse Name: Konrad Dolores  . Number of Children: 1  . Years of Education: N/A   Occupational History  . Not on file.   Social History Main Topics  . Smoking status: Former Smoker    Quit date: 05/30/1994  . Smokeless tobacco: Never Used  . Alcohol Use: No  . Drug Use: No  . Sexual Activity: Not on file   Other Topics Concern  . Not on file   Social History Narrative    Past Medical History  Diagnosis Date  . Thyroid  disease   . Hypertension   . Allergy   . GERD (gastroesophageal reflux disease)   . Osteoporosis   . Hyperlipidemia      Patient Active Problem List   Diagnosis Date Noted  . Allergic rhinitis, seasonal 10/15/2014  . Cardiac murmur 10/15/2014  . Colitis 10/15/2014  . H/O surgical procedure 10/15/2014  . History of colon polyps 10/15/2014  . Hyperthyroidism 10/15/2014  . Cannot sleep  10/15/2014  . Calculus of kidney 10/15/2014  . Herpes zona 10/15/2014  . CA of skin 10/15/2014  . Colitis gravis ([place]) 10/15/2014  . Aortic heart valve narrowing 12/03/2013  . Arteriosclerosis of coronary artery 11/23/2013  . Heart attack ([place]) 11/23/2013  . Myocardial infarction ([place]) 11/23/2013  . Peripheral vascular disease ([place]) 11/23/2013  . Diverticulitis of colon 07/21/2009  . Complication of internal prosthetic device 01/27/2009  . Hemorrhoids without complication 99/35/7017  . Peripheral blood vessel disorder ([place]) 01/27/2009  . Current tobacco use 01/27/2009  . Absolute anemia 10/29/2008  . Atherosclerosis of coronary artery 10/29/2008  . Essential (primary) hypertension 10/29/2008  . Acid reflux 10/29/2008  . Hypercholesteremia 10/29/2008  . OP (osteoporosis) 10/29/2008  . Healed myocardial infarct 05/31/1997    Past Surgical History  Procedure Laterality Date  . Carotid endarterectomy Left 06/21/2014    Dr. Delana Meyer  . Carotid endarterectomy Right 08/02/2014    Dr. Delana Meyer  . Hernia repair  2008  . Abdominal hysterectomy  2007  . Breast enhancement surgery  2004  . Breast surgery  1984    Fibrocystic Disease  . Femoral artery stent  08/2003  . Renal artery stent  08/2003  . Coronary angioplasty with stent placement  1999    Her family history includes Alzheimer's disease in her sister; Arthritis in her sister; Congestive Heart Failure in her mother; Emphysema in her brother; Heart attack in her brother; Hyperlipidemia in her mother; Kidney failure in her brother; Leukemia in her brother; Pancreatic cancer in her father; Prostate cancer in her brother; Stomach cancer in her brother.    Previous Medications   ASCORBIC ACID (VITAMIN C) 1000 MG TABLET    Take 1,000 mg by mouth daily.    ASPIRIN 81 MG TABLET    Take 81 mg by mouth daily. ASPIRIN, 81MG (Oral Tablet)  1 Every Day for 0 days  Quantity: 0.00;  Refills: 0   Ordered :13-Apr-2010  Celene Kras, MA,  Anastasiya ;  Started 29-October-2008 Active Comments: DX: 414.00   B COMPLEX VITAMINS PO    Take 1 tablet by mouth daily. B COMPLEX (Oral Capsule)  1 Every Day for 0 days  Quantity: 0.00;  Refills: 0   Ordered :13-Apr-2010  Celene Kras, MA, Anastasiya ;  Started 29-October-2008 Active Comments: DX: 414.00   CALCIUM-VITAMIN D-VITAMIN K 500-100-40 MG-UNT-MCG CHEW    Chew 1 tablet by mouth 2 (two) times daily. VIACTIV, 500-100-40 (Oral Tablet Chewable)  1 tablet twice a day for 0 days  Quantity: 0.00;  Refills: 0   Ordered :13-Apr-2010  Celene Kras, MA, Anastasiya ;  Started 29-October-2008 Active Comments: DX: 733.00   COENZYME Q10 (CO Q 10) 100 MG CAPS    Take 1 capsule by mouth daily.    CRANBERRY 300 MG TABLET    Take 300 mg by mouth daily.    FAMOTIDINE (PEPCID) 40 MG TABLET    Take 40 mg by mouth daily.    FERROUS SULFATE 325 (65 FE) MG EC TABLET    Take 325 mg by mouth daily. FERROUS SULFATE, 325 (  65 Fe)MG (Oral Tablet Delayed Release)  1 every day for 0 days  Quantity: 0.00;  Refills: 0   Ordered :13-Apr-2010  Celene Kras, MA, Anastasiya ;  Started 29-October-2008 Active Comments: DX: 285.9   FEXOFENADINE (ALLEGRA) 180 MG TABLET    Take 180 mg by mouth daily.    FLUTICASONE (FLONASE) 50 MCG/ACT NASAL SPRAY    Place 2 sprays into the nose daily.    HYDROCHLOROTHIAZIDE (HYDRODIURIL) 25 MG TABLET    Take 25 mg by mouth daily.    LOSARTAN (COZAAR) 100 MG TABLET    TAKE ONE (1) TABLET EACH DAY   MONTELUKAST (SINGULAIR) 10 MG TABLET    Take 10 mg by mouth at bedtime.    MULTIPLE VITAMIN PO    Take 1 tablet by mouth daily. MULTIVITAMINS (Oral Tablet)  1 Every Day for 0 days  Quantity: 0.00;  Refills: 0   Ordered :13-Apr-2010  Celene Kras, MA, Anastasiya ;  Started 29-October-2008 Active Comments: DX: 414.00   OMEGA-3 FATTY ACIDS (FISH OIL) 1000 MG CAPS    Take 1 capsule by mouth daily. FISH OIL CONCENTRATE, 1000MG (Oral Capsule)  1 Every Day for 0 days  Quantity: 0.00;  Refills: 0   Ordered :13-Apr-2010  Celene Kras,  MA, Anastasiya ;  Started 29-October-2008 Active Comments: DX: 414.00   ROSUVASTATIN (CRESTOR) 20 MG TABLET    Take 20 mg by mouth daily.    SULFASALAZINE (AZULFIDINE) 500 MG TABLET    Take 500 mg by mouth 2 (two) times daily.     Patient Care Team: Margarita Rana, MD as PCP - General (Family Medicine)     Objective:   Vitals: BP 130/74 mmHg  Pulse 64  Temp(Src) 97.8 F (36.6 C)  Resp 16  Ht 5' 1.5" (1.562 m)  Wt 141 lb (63.957 kg)  BMI 26.21 kg/m2  SpO2 96%  Physical Exam  Constitutional: She is oriented to person, place, and time. She appears well-developed and well-nourished.  HENT:  Head: Normocephalic and atraumatic.  Right Ear: Tympanic membrane, external ear and ear canal normal.  Left Ear: Tympanic membrane, external ear and ear canal normal.  Nose: Nose normal.  Mouth/Throat: Uvula is midline, oropharynx is clear and moist and mucous membranes are normal.  Eyes: Conjunctivae, EOM and lids are normal. Pupils are equal, round, and reactive to light.  Neck: Trachea normal and normal range of motion. Neck supple. Carotid bruit is not present. No thyroid mass and no thyromegaly present.  Cardiovascular: Normal rate and regular rhythm.   Murmur heard.  Systolic murmur is present with a grade of 3/6  Pulmonary/Chest: Effort normal and breath sounds normal.  Bilateral masectomy  Abdominal: Soft. Normal appearance and bowel sounds are normal. There is no hepatosplenomegaly. There is tenderness in the left lower quadrant.  Genitourinary: No breast swelling, tenderness or discharge.  Musculoskeletal: Normal range of motion.  Lymphadenopathy:    She has no cervical adenopathy.    She has no axillary adenopathy.  Neurological: She is alert and oriented to person, place, and time. She has normal strength. No cranial nerve deficit.  Skin: Skin is warm, dry and intact.  Psychiatric: She has a normal mood and affect. Her speech is normal and behavior is normal. Judgment and thought  content normal. Cognition and memory are normal.    Activities of Daily Living In your present state of health, do you have any difficulty performing the following activities: 06/10/2015 12/16/2014  Hearing? N N  Vision? N N  Difficulty concentrating or  making decisions? N N  Walking or climbing stairs? N N  Dressing or bathing? N N  Doing errands, shopping? N N    Fall Risk Assessment Fall Risk  06/10/2015 12/16/2014  Falls in the past year? No No     Depression Screen PHQ 2/9 Scores 06/10/2015 12/16/2014  PHQ - 2 Score 0 0    Cognitive Testing - 6-CIT  Correct? Score   What year is it? yes 0 0 or 4  What month is it? yes 0 0 or 3  Memorize:    Pia Mau,  42,  [address]  [place],      What time is it? (within 1 hour) yes 0 0 or 3  Count backwards from 20 yes 0 0, 2, or 4  Name the months of the year yes 0 0, 2, or 4  Repeat name & address above yes 1 0, 2, 4, 6, 8, or 10       TOTAL SCORE  1/28   Interpretation:  Normal  Normal (0-7) Abnormal (8-28)       Assessment & Plan:     Annual Wellness Visit  Reviewed patient's Family Medical History Reviewed and updated list of patient's medical providers Assessment of cognitive impairment was done Assessed patient's functional ability Established a written schedule for health screening services Health Risk Assessment Completed and Reviewed  Exercise Activities and Dietary recommendations Goals    . Exercise 150 minutes per week (moderate activity)       Immunization History  Administered Date(s) Administered  . Pneumococcal Conjugate-13 05/18/2013  . Pneumococcal Polysaccharide-23 03/02/2004  . Td 02/08/2005  . Zoster 01/29/2006    1. Medicare annual wellness visit, subsequent Continue to eat healthy and exercise and recheck annually.  Reviewed and sent in all maintenance medication today for 90 days with 3 refills.    2. Other acute sinusitis New problem. Will treat. Patient instructed to call back if  condition worsens or does not improve.    - cefdinir (OMNICEF) 300 MG capsule; Take 1 capsule (300 mg total) by mouth 2 (two) times daily.  Dispense: 20 capsule; Refill: 0  3. Colitis Having a flare. Will  Increase medication and see if helps. Will refer to new gastroenterologist.  - Ambulatory referral to Gastroenterology - sulfaSALAzine (AZULFIDINE) 500 MG tablet; Take 1 tablet (500 mg total) by mouth 3 (three) times daily.  Dispense: 270 tablet; Refill: 3  4. Hip pain, right Follow up with chiropractor. Consider referral to orthopedics if does not improve.    5. Essential (primary) hypertension Condition is stable. Please continue current medication and  plan of care as noted.   - losartan (COZAAR) 100 MG tablet; Take 1 tablet (100 mg total) by mouth daily.  Dispense: 90 tablet; Refill: 3   Patient was seen and examined by Jerrell Belfast, MD, and note scribed by Lynford Humphrey, [place].   I have reviewed the document for accuracy and completeness and I agree with above. Jerrell Belfast, MD   Margarita Rana, MD    ------------------------------------------------------------------------------------------------------------

## 2015-06-16 DIAGNOSIS — R197 Diarrhea, unspecified: Secondary | ICD-10-CM | POA: Diagnosis not present

## 2015-06-16 DIAGNOSIS — Z8719 Personal history of other diseases of the digestive system: Secondary | ICD-10-CM | POA: Diagnosis not present

## 2015-06-16 DIAGNOSIS — K219 Gastro-esophageal reflux disease without esophagitis: Secondary | ICD-10-CM | POA: Diagnosis not present

## 2015-06-16 DIAGNOSIS — R1032 Left lower quadrant pain: Secondary | ICD-10-CM | POA: Diagnosis not present

## 2015-06-16 DIAGNOSIS — Z86010 Personal history of colonic polyps: Secondary | ICD-10-CM | POA: Diagnosis not present

## 2015-06-27 ENCOUNTER — Encounter: Payer: Self-pay | Admitting: Family Medicine

## 2015-06-27 DIAGNOSIS — I10 Essential (primary) hypertension: Secondary | ICD-10-CM | POA: Diagnosis not present

## 2015-06-27 DIAGNOSIS — I2119 ST elevation (STEMI) myocardial infarction involving other coronary artery of inferior wall: Secondary | ICD-10-CM | POA: Diagnosis not present

## 2015-06-27 DIAGNOSIS — I2510 Atherosclerotic heart disease of native coronary artery without angina pectoris: Secondary | ICD-10-CM | POA: Diagnosis not present

## 2015-06-27 DIAGNOSIS — I739 Peripheral vascular disease, unspecified: Secondary | ICD-10-CM | POA: Diagnosis not present

## 2015-06-27 DIAGNOSIS — E782 Mixed hyperlipidemia: Secondary | ICD-10-CM | POA: Diagnosis not present

## 2015-07-11 ENCOUNTER — Encounter: Payer: Self-pay | Admitting: *Deleted

## 2015-07-14 ENCOUNTER — Ambulatory Visit
Admission: RE | Admit: 2015-07-14 | Discharge: 2015-07-14 | Disposition: A | Discharge status: Home / Self Care | Payer: PPO | Source: Ambulatory Visit | Attending: Gastroenterology | Admitting: Gastroenterology

## 2015-07-14 ENCOUNTER — Encounter
Admission: RE | Disposition: A | Discharge status: Home / Self Care | Payer: Self-pay | Source: Ambulatory Visit | Attending: Gastroenterology

## 2015-07-14 ENCOUNTER — Encounter: Payer: Self-pay | Admitting: Anesthesiology

## 2015-07-14 ENCOUNTER — Ambulatory Visit: Payer: PPO | Admitting: Anesthesiology

## 2015-07-14 DIAGNOSIS — R197 Diarrhea, unspecified: Secondary | ICD-10-CM | POA: Diagnosis not present

## 2015-07-14 DIAGNOSIS — M1990 Unspecified osteoarthritis, unspecified site: Secondary | ICD-10-CM | POA: Diagnosis not present

## 2015-07-14 DIAGNOSIS — Z881 Allergy status to other antibiotic agents status: Secondary | ICD-10-CM | POA: Insufficient documentation

## 2015-07-14 DIAGNOSIS — Z886 Allergy status to analgesic agent status: Secondary | ICD-10-CM | POA: Diagnosis not present

## 2015-07-14 DIAGNOSIS — Z79899 Other long term (current) drug therapy: Secondary | ICD-10-CM | POA: Insufficient documentation

## 2015-07-14 DIAGNOSIS — E0590 Thyrotoxicosis, unspecified without thyrotoxic crisis or storm: Secondary | ICD-10-CM | POA: Diagnosis not present

## 2015-07-14 DIAGNOSIS — Z7982 Long term (current) use of aspirin: Secondary | ICD-10-CM | POA: Insufficient documentation

## 2015-07-14 DIAGNOSIS — K219 Gastro-esophageal reflux disease without esophagitis: Secondary | ICD-10-CM | POA: Diagnosis not present

## 2015-07-14 DIAGNOSIS — I10 Essential (primary) hypertension: Secondary | ICD-10-CM | POA: Insufficient documentation

## 2015-07-14 DIAGNOSIS — M810 Age-related osteoporosis without current pathological fracture: Secondary | ICD-10-CM | POA: Diagnosis not present

## 2015-07-14 DIAGNOSIS — Z8719 Personal history of other diseases of the digestive system: Secondary | ICD-10-CM | POA: Diagnosis not present

## 2015-07-14 DIAGNOSIS — I252 Old myocardial infarction: Secondary | ICD-10-CM | POA: Insufficient documentation

## 2015-07-14 DIAGNOSIS — Z885 Allergy status to narcotic agent status: Secondary | ICD-10-CM | POA: Diagnosis not present

## 2015-07-14 DIAGNOSIS — Z87891 Personal history of nicotine dependence: Secondary | ICD-10-CM | POA: Insufficient documentation

## 2015-07-14 DIAGNOSIS — Z87442 Personal history of urinary calculi: Secondary | ICD-10-CM | POA: Diagnosis not present

## 2015-07-14 DIAGNOSIS — Z955 Presence of coronary angioplasty implant and graft: Secondary | ICD-10-CM | POA: Diagnosis not present

## 2015-07-14 DIAGNOSIS — E785 Hyperlipidemia, unspecified: Secondary | ICD-10-CM | POA: Diagnosis not present

## 2015-07-14 DIAGNOSIS — R1032 Left lower quadrant pain: Secondary | ICD-10-CM | POA: Diagnosis not present

## 2015-07-14 DIAGNOSIS — I739 Peripheral vascular disease, unspecified: Secondary | ICD-10-CM | POA: Insufficient documentation

## 2015-07-14 DIAGNOSIS — E7800 Pure hypercholesterolemia, unspecified: Secondary | ICD-10-CM | POA: Diagnosis not present

## 2015-07-14 DIAGNOSIS — Z91013 Allergy to seafood: Secondary | ICD-10-CM | POA: Diagnosis not present

## 2015-07-14 DIAGNOSIS — Z880 Allergy status to penicillin: Secondary | ICD-10-CM | POA: Insufficient documentation

## 2015-07-14 DIAGNOSIS — K5730 Diverticulosis of large intestine without perforation or abscess without bleeding: Secondary | ICD-10-CM | POA: Diagnosis not present

## 2015-07-14 DIAGNOSIS — I2510 Atherosclerotic heart disease of native coronary artery without angina pectoris: Secondary | ICD-10-CM | POA: Diagnosis not present

## 2015-07-14 DIAGNOSIS — Z86010 Personal history of colonic polyps: Secondary | ICD-10-CM | POA: Diagnosis not present

## 2015-07-14 DIAGNOSIS — K5790 Diverticulosis of intestine, part unspecified, without perforation or abscess without bleeding: Secondary | ICD-10-CM | POA: Diagnosis not present

## 2015-07-14 HISTORY — PX: COLONOSCOPY WITH PROPOFOL: SHX5780

## 2015-07-14 LAB — SURGICAL PATHOLOGY

## 2015-07-14 SURGERY — COLONOSCOPY WITH PROPOFOL
Anesthesia: General

## 2015-07-14 MED ORDER — SODIUM CHLORIDE 0.9 % IV SOLN
INTRAVENOUS | Status: DC
  Administered 2015-07-14: 1000 mL via INTRAVENOUS

## 2015-07-14 MED ORDER — PROPOFOL 500 MG/50ML IV EMUL
INTRAVENOUS | Status: DC | PRN
  Administered 2015-07-14: 100 ug/kg/min via INTRAVENOUS

## 2015-07-14 MED ORDER — LIDOCAINE HCL (CARDIAC) 20 MG/ML IV SOLN
INTRAVENOUS | Status: DC | PRN
  Administered 2015-07-14: 30 mg via INTRAVENOUS

## 2015-07-14 MED ORDER — PROPOFOL 10 MG/ML IV BOLUS
INTRAVENOUS | Status: DC | PRN
  Administered 2015-07-14: 70 mg via INTRAVENOUS
  Administered 2015-07-14 (×2): 20 mg via INTRAVENOUS

## 2015-07-14 NOTE — Op Note (Signed)
[facility] Patient Name: Misty Page Procedure Date: 07/14/2015 12:50 PM MRN: [id number] Account #: [id number] Date of Birth: April 28, 1941 Admit Type: Outpatient Age: 75 Room: [facility] ENDO ROOM 4 Gender: Female Note Status: Finalized Procedure:         Colonoscopy Indications:       Abdominal pain in the left lower quadrant, Diarrhea, Hx of                     ulcerative colitis Providers:         Lupita Dawn. Candace Cruise, MD Referring MD:      Jerrell Belfast, MD (Referring MD) Medicines:         Monitored Anesthesia Care Complications:     No immediate complications. Procedure:         Pre-Anesthesia Assessment:                    - Prior to the procedure, a History and Physical was                     performed, and patient medications, allergies and                     sensitivities were reviewed. The patient's tolerance of                     previous anesthesia was reviewed.                    - The risks and benefits of the procedure and the sedation                     options and risks were discussed with the patient. All                     questions were answered and informed consent was obtained.                    - After reviewing the risks and benefits, the patient was                     deemed in satisfactory condition to undergo the procedure.                    After obtaining informed consent, the colonoscope was                     passed under direct vision. Throughout the procedure, the                     patient's blood pressure, pulse, and oxygen saturations                     were monitored continuously. The Olympus CF-H180AL                     colonoscope ( S#: [id number] ) was introduced through the                     anus and advanced to the the cecum, identified by                     appendiceal orifice and ileocecal valve. The colonoscopy  was performed without difficulty. The patient tolerated     the procedure well. The quality of the bowel preparation                     was good. Findings:      Multiple small and large-mouthed diverticula were found in the entire       colon. Biopsies were taken with a cold forceps for histology. Biopsies       taken from sigmoid and rectum.      The exam was otherwise without abnormality. Impression:        - Diverticulosis in the entire examined colon. Biopsied.                    - The examination was otherwise normal. Recommendation:    - Discharge patient to home.                    - Await pathology results.                    - The findings and recommendations were discussed with the                     patient.                    - Continue present medications. Procedure Code(s): --- Professional ---                    [id number], Colonoscopy, flexible; with biopsy, single or                     multiple Diagnosis Code(s): --- Professional ---                    R10.32, Left lower quadrant pain                    R19.7, Diarrhea, unspecified                    K57.30, Diverticulosis of large intestine without                     perforation or abscess without bleeding CPT copyright 2016 American Medical Association. All rights reserved. The codes documented in this report are preliminary and upon coder review may  be revised to meet current compliance requirements. Hulen Luster, MD 07/14/2015 1:05:05 PM This report has been signed electronically. Number of Addenda: 0 Note Initiated On: 07/14/2015 12:50 PM Scope Withdrawal Time: 0 hours 3 minutes 50 seconds  Total Procedure Duration: 0 hours 7 minutes 36 seconds       [facility]

## 2015-07-14 NOTE — Anesthesia Preprocedure Evaluation (Signed)
Anesthesia Evaluation  Patient identified by MRN, date of birth, ID band Patient awake    Reviewed: Allergy & Precautions, H&P , NPO status , Patient's Chart, lab work & pertinent test results, reviewed documented beta blocker date and time   History of Anesthesia Complications (+) PONV and history of anesthetic complications  Airway Mallampati: I  TM Distance: >3 FB Neck ROM: full    Dental no notable dental hx. (+) Caps, Poor Dentition   Pulmonary neg pulmonary ROS, former smoker,    Pulmonary exam normal breath sounds clear to auscultation       Cardiovascular Exercise Tolerance: Good hypertension, (-) angina+ CAD, + Past MI (in 1999), + Cardiac Stents (1 placed in 1999) and + Peripheral Vascular Disease  (-) CABG Normal cardiovascular exam(-) dysrhythmias + Valvular Problems/Murmurs (as a child)  Rhythm:regular Rate:Normal     Neuro/Psych negative neurological ROS  negative psych ROS   GI/Hepatic Neg liver ROS, PUD, GERD  ,  Endo/Other  neg diabetesHyperthyroidism   Renal/GU Renal disease (kidney stones)  negative genitourinary   Musculoskeletal   Abdominal   Peds  Hematology  (+) anemia ,   Anesthesia Other Findings Past Medical History:   Thyroid disease                                              Hypertension                                                 Allergy                                                      GERD (gastroesophageal reflux disease)                       Osteoporosis                                                 Hyperlipidemia                                               Reproductive/Obstetrics negative OB ROS                             Anesthesia Physical Anesthesia Plan  ASA: III  Anesthesia Plan: General   Post-op Pain Management:    Induction:   Airway Management Planned:   Additional Equipment:   Intra-op Plan:   Post-operative Plan:    Informed Consent: I have reviewed the patients History and Physical, chart, labs and discussed the procedure including the risks, benefits and alternatives for the proposed anesthesia with the patient or authorized representative who has indicated his/her understanding and acceptance.   Dental Advisory Given  Plan Discussed with: Anesthesiologist, CRNA  and Surgeon  Anesthesia Plan Comments:         Anesthesia Quick Evaluation

## 2015-07-14 NOTE — Transfer of Care (Signed)
Immediate Anesthesia Transfer of Care Note  Patient: Misty Page  Procedure(s) Performed: Procedure(s): COLONOSCOPY WITH PROPOFOL (N/A)  Patient Location: Endoscopy Unit  Anesthesia Type:General  Level of Consciousness: awake and alert   Airway & Oxygen Therapy: Patient Spontanous Breathing and Patient connected to nasal cannula oxygen  Post-op Assessment: Report given to RN and Post -op Vital signs reviewed and stable  Post vital signs: Reviewed and stable  Last Vitals:  Filed Vitals:   07/14/15 1213  BP: 188/73  Pulse: 78  Temp: 36.1 C  Resp: 16    Complications: No apparent anesthesia complications

## 2015-07-15 NOTE — Anesthesia Postprocedure Evaluation (Signed)
Anesthesia Post Note  Patient: Misty Page  Procedure(s) Performed: Procedure(s) (LRB): COLONOSCOPY WITH PROPOFOL (N/A)  Patient location during evaluation: Endoscopy Anesthesia Type: General Level of consciousness: awake and alert Pain management: pain level controlled Vital Signs Assessment: post-procedure vital signs reviewed and stable Respiratory status: spontaneous breathing, nonlabored ventilation, respiratory function stable and patient connected to nasal cannula oxygen Cardiovascular status: blood pressure returned to baseline and stable Postop Assessment: no signs of nausea or vomiting Anesthetic complications: no    Last Vitals:  Filed Vitals:   07/14/15 1330 07/14/15 1340  BP: 163/77 182/80  Pulse: 78 78  Temp:    Resp: 19 17    Last Pain: There were no vitals filed for this visit.               Martha Clan

## 2015-07-16 ENCOUNTER — Encounter: Payer: Self-pay | Admitting: Gastroenterology

## 2015-07-21 DIAGNOSIS — D2261 Melanocytic nevi of right upper limb, including shoulder: Secondary | ICD-10-CM | POA: Diagnosis not present

## 2015-07-21 DIAGNOSIS — D225 Melanocytic nevi of trunk: Secondary | ICD-10-CM | POA: Diagnosis not present

## 2015-07-21 DIAGNOSIS — D2271 Melanocytic nevi of right lower limb, including hip: Secondary | ICD-10-CM | POA: Diagnosis not present

## 2015-07-21 DIAGNOSIS — Z85828 Personal history of other malignant neoplasm of skin: Secondary | ICD-10-CM | POA: Diagnosis not present

## 2015-07-24 ENCOUNTER — Telehealth: Payer: Self-pay | Admitting: Family Medicine

## 2015-07-24 NOTE — Telephone Encounter (Signed)
Would recommend ov. Thanks.

## 2015-07-24 NOTE — Telephone Encounter (Signed)
Pt states that she had a colonoscopy done this month and that Dr. Candace Cruise recommended her to get her Thyroid and Cholesterol rechecked, Thyroid & Cholesterol were a little wacky when she had her colonoscopy done. Pt is willing to get labs drawn, Pt wants to know if the lab papers will be ready to pick up or does she need a office visit first, I informed pt that Dr. Venia Minks may want a office visit first but either way pt is willing to do either one.  Please return call @ [phone]  Thanks CC

## 2015-07-25 NOTE — Telephone Encounter (Signed)
Informed pt. Made OV for 08/04/2014 at 0800, which was pt's earliest convenience. Renaldo Fiddler, CMA

## 2015-08-04 ENCOUNTER — Ambulatory Visit (INDEPENDENT_AMBULATORY_CARE_PROVIDER_SITE_OTHER): Payer: PPO | Admitting: Family Medicine

## 2015-08-04 ENCOUNTER — Encounter: Payer: Self-pay | Admitting: Family Medicine

## 2015-08-04 VITALS — BP 124/74 | HR 88 | Temp 98.5°F | Resp 16 | Wt 135.0 lb

## 2015-08-04 DIAGNOSIS — S46811A Strain of other muscles, fascia and tendons at shoulder and upper arm level, right arm, initial encounter: Secondary | ICD-10-CM | POA: Diagnosis not present

## 2015-08-04 DIAGNOSIS — E7800 Pure hypercholesterolemia, unspecified: Secondary | ICD-10-CM

## 2015-08-04 DIAGNOSIS — M791 Myalgia, unspecified site: Secondary | ICD-10-CM

## 2015-08-04 DIAGNOSIS — E0590 Thyrotoxicosis, unspecified without thyrotoxic crisis or storm: Secondary | ICD-10-CM

## 2015-08-04 NOTE — Progress Notes (Signed)
Subjective:    Patient ID: Misty Page, female    DOB: 1941/02/21, 75 y.o.   MRN: [id number]   Abnormal Labs Pt saw Dr. Candace Cruise for evaluation of colitis on 06/16/2015. Dr. Candace Cruise performed labs at that time. Pt's TSH was 0.071. Dr. Candace Cruise advised pt to FU with PCP regarding her TSH and lipids.  Hyperlipidemia This is a chronic problem. The problem is controlled. Lipid results: 12/17/2014- Total- 200, Trig- 94, HDL- 75, LDL- 106. Associated symptoms include myalgias. Pertinent negatives include no chest pain or shortness of breath. (Pt reports that her pharmacist changed her Crestor to the generic rosuvastatin. Pt has since noticed aches in her legs, as well as aching in her shoulder/arms. ) Current antihyperlipidemic treatment includes statins (Crestor). The current treatment provides significant improvement of lipids. There are no compliance problems.  Risk factors for coronary artery disease include dyslipidemia, hypertension, post-menopausal and family history.      Review of Systems  Constitutional: Positive for diaphoresis (night sweats ) and fatigue. Negative for fever, chills, activity change, appetite change and unexpected weight change.  Respiratory: Negative for cough, shortness of breath and wheezing.   Cardiovascular: Negative for chest pain, palpitations and leg swelling.  Endocrine: Negative for cold intolerance and heat intolerance.  Musculoskeletal: Positive for myalgias.   BP 124/74 mmHg  Pulse 88  Temp(Src) 98.5 F (36.9 C) (Oral)  Resp 16  Wt 135 lb (61.236 kg)   Patient Active Problem List   Diagnosis Date Noted  . Allergic rhinitis, seasonal 10/15/2014  . Cardiac murmur 10/15/2014  . Colitis 10/15/2014  . H/O surgical procedure 10/15/2014  . History of colon polyps 10/15/2014  . Hyperthyroidism 10/15/2014  . Cannot sleep 10/15/2014  . Calculus of kidney 10/15/2014  . Herpes zona 10/15/2014  . CA of skin 10/15/2014  . Colitis gravis ([place]) 10/15/2014  . Aortic heart  valve narrowing 12/03/2013  . Arteriosclerosis of coronary artery 11/23/2013  . Heart attack ([place]) 11/23/2013  . Myocardial infarction ([place]) 11/23/2013  . Peripheral vascular disease ([place]) 11/23/2013  . Diverticulitis of colon 07/21/2009  . Complication of internal prosthetic device 01/27/2009  . Hemorrhoids without complication 63/84/5364  . Peripheral blood vessel disorder ([place]) 01/27/2009  . Current tobacco use 01/27/2009  . Absolute anemia 10/29/2008  . Atherosclerosis of coronary artery 10/29/2008  . Essential (primary) hypertension 10/29/2008  . Acid reflux 10/29/2008  . Hypercholesteremia 10/29/2008  . OP (osteoporosis) 10/29/2008  . Healed myocardial infarct 05/31/1997   Past Medical History  Diagnosis Date  . Thyroid disease   . Hypertension   . Allergy   . GERD (gastroesophageal reflux disease)   . Osteoporosis   . Hyperlipidemia    Current Outpatient Prescriptions on File Prior to Visit  Medication Sig  . Ascorbic Acid (VITAMIN C) 1000 MG tablet Take 1,000 mg by mouth daily.   Marland Kitchen aspirin 81 MG tablet Take 81 mg by mouth daily. ASPIRIN, 81MG (Oral Tablet)  1 Every Day for 0 days  Quantity: 0.00;  Refills: 0   Ordered :13-Apr-2010  Celene Kras, MA, Anastasiya ;  Started 29-October-2008 Active Comments: DX: 414.00  . B COMPLEX VITAMINS PO Take 1 tablet by mouth daily. B COMPLEX (Oral Capsule)  1 Every Day for 0 days  Quantity: 0.00;  Refills: 0   Ordered :13-Apr-2010  Celene Kras, MA, Anastasiya ;  Started 29-October-2008 Active Comments: DX: 414.00  . Calcium-Vitamin D-Vitamin K 500-100-40 MG-UNT-MCG CHEW Chew 1 tablet by mouth 2 (two) times daily. VIACTIV, 500-100-40 (Oral Tablet Chewable)  1 tablet twice a day for 0 days  Quantity: 0.00;  Refills: 0   Ordered :13-Apr-2010  Celene Kras, MA, Anastasiya ;  Started 29-October-2008 Active Comments: DX: 733.00  . Coenzyme Q10 (CO Q 10) 100 MG CAPS Take 1 capsule by mouth daily.   . Cranberry 300 MG tablet Take 300 mg by mouth daily.   .  famotidine (PEPCID) 40 MG tablet Take 1 tablet (40 mg total) by mouth daily.  . ferrous sulfate 325 (65 FE) MG EC tablet Take 325 mg by mouth daily. FERROUS SULFATE, 325 (65 Fe)MG (Oral Tablet Delayed Release)  1 every day for 0 days  Quantity: 0.00;  Refills: 0   Ordered :13-Apr-2010  Celene Kras, MA, Anastasiya ;  Started 29-October-2008 Active Comments: DX: 285.9  . fexofenadine (ALLEGRA) 180 MG tablet Take 180 mg by mouth daily.   . fluticasone (FLONASE) 50 MCG/ACT nasal spray Place 2 sprays into both nostrils daily.  . hydrochlorothiazide (HYDRODIURIL) 25 MG tablet Take 1 tablet (25 mg total) by mouth daily.  Marland Kitchen losartan (COZAAR) 100 MG tablet Take 1 tablet (100 mg total) by mouth daily.  . montelukast (SINGULAIR) 10 MG tablet Take 1 tablet (10 mg total) by mouth at bedtime.  . MULTIPLE VITAMIN PO Take 1 tablet by mouth daily. MULTIVITAMINS (Oral Tablet)  1 Every Day for 0 days  Quantity: 0.00;  Refills: 0   Ordered :13-Apr-2010  Celene Kras, MA, Anastasiya ;  Started 29-October-2008 Active Comments: DX: 414.00  . Omega-3 Fatty Acids (FISH OIL) 1000 MG CAPS Take 1 capsule by mouth daily. FISH OIL CONCENTRATE, 1000MG (Oral Capsule)  1 Every Day for 0 days  Quantity: 0.00;  Refills: 0   Ordered :13-Apr-2010  Celene Kras, MA, Anastasiya ;  Started 29-October-2008 Active Comments: DX: 414.00  . rosuvastatin (CRESTOR) 20 MG tablet Take 1 tablet (20 mg total) by mouth daily.  Marland Kitchen sulfaSALAzine (AZULFIDINE) 500 MG tablet Take 1 tablet (500 mg total) by mouth 3 (three) times daily.   No current facility-administered medications on file prior to visit.   Allergies  Allergen Reactions  . Codeine   . Levofloxacin   . Naproxen   . Penicillins     Rash  . Shellfish Allergy    Past Surgical History  Procedure Laterality Date  . Carotid endarterectomy Left 06/21/2014    Dr. Delana Meyer  . Carotid endarterectomy Right 08/02/2014    Dr. Delana Meyer  . Hernia repair  2008  . Abdominal hysterectomy  2007  . Breast  enhancement surgery  2004  . Breast surgery  1984    Fibrocystic Disease  . Femoral artery stent  08/2003  . Renal artery stent  08/2003  . Coronary angioplasty with stent placement  1999  . Colonoscopy with propofol N/A 07/14/2015    Procedure: COLONOSCOPY WITH PROPOFOL;  Surgeon: Hulen Luster, MD;  [facility] ENDOSCOPY;  Service: Gastroenterology;  Laterality: N/A;   Social History   Social History  . Marital Status: Married    Spouse Name: Konrad Dolores  . Number of Children: 1  . Years of Education: N/A   Occupational History  . Not on file.   Social History Main Topics  . Smoking status: Former Smoker    Quit date: 05/30/1994  . Smokeless tobacco: Never Used  . Alcohol Use: No  . Drug Use: No  . Sexual Activity: Not on file   Other Topics Concern  . Not on file   Social History Narrative   Family History  Problem Relation Age of  Onset  . Hyperlipidemia Mother   . Congestive Heart Failure Mother   . Pancreatic cancer Father   . Arthritis Sister   . Alzheimer's disease Sister   . Prostate cancer Brother   . Heart attack Brother   . Stomach cancer Brother   . Kidney failure Brother   . Leukemia Brother   . Emphysema Brother        Objective:   Physical Exam  Constitutional: She is oriented to person, place, and time. She appears well-developed and well-nourished.  Cardiovascular: Normal rate and regular rhythm.   Pulmonary/Chest: Breath sounds normal.  Musculoskeletal: Normal range of motion. She exhibits tenderness.  Tender over right trapezius muscle. Tender over right bicep.   Neurological: She is alert and oriented to person, place, and time. Coordination normal.  Psychiatric: She has a normal mood and affect. Her behavior is normal. Judgment and thought content normal.  BP 124/74 mmHg  Pulse 88  Temp(Src) 98.5 F (36.9 C) (Oral)  Resp 16  Wt 135 lb (61.236 kg)     Assessment & Plan:  1. Subclinical hyperthyroidism Will check labs. Patient with no  symptoms currently.  - Thyroid Panel With TSH  2. Hypercholesteremia Stable.  Unclear if causing pain. Will monitor. More likely related to he job.    3. Myalgia Check labs.  - CK (Creatine Kinase) - Magnesium  4. Trapezius strain, right, initial encounter New problem Will take OTC medication. Check labs. Take week or off work and see if helps. Patient instructed to call back if condition worsens or does not improve.   Start some stretching, strengthening exercises.     Patient was seen and examined by Jerrell Belfast, MD, and note scribed by Renaldo Fiddler, CMA. I have reviewed the document for accuracy and completeness and I agree with above. Jerrell Belfast, MD   Margarita Rana, MD

## 2015-08-04 NOTE — Patient Instructions (Signed)
Take Tylenol 500 mg 2 tablets twice daily.  Take 2 weeks off of work.     Shoulder Range of Motion Exercises Shoulder range of motion (ROM) exercises are designed to keep the shoulder moving freely. They are often recommended for people who have shoulder pain. MOVEMENT EXERCISE When you are able, do this exercise 5-6 days per week, or as told by your health care provider. Work toward doing 2 sets of 10 swings. Pendulum Exercise How To Do This Exercise Lying Down 1. Lie face-down on a bed with your abdomen close to the side of the bed. 2. Let your arm hang over the side of the bed. 3. Relax your shoulder, arm, and hand. 4. Slowly and gently swing your arm forward and back. Do not use your neck muscles to swing your arm. They should be relaxed. If you are struggling to swing your arm, have someone gently swing it for you. When you do this exercise for the first time, swing your arm at a 15 degree angle for 15 seconds, or swing your arm 10 times. As pain lessens over time, increase the angle of the swing to 30-45 degrees. 5. Repeat steps 1-4 with the other arm. How To Do This Exercise While Standing 1. Stand next to a sturdy chair or table and hold on to it with your hand.  Bend forward at the waist.  Bend your knees slightly.  Relax your other arm and let it hang limp.  Relax the shoulder blade of the arm that is hanging and let it drop.  While keeping your shoulder relaxed, use body motion to swing your arm in small circles. The first time you do this exercise, swing your arm for about 30 seconds or 10 times. When you do it next time, swing your arm for a little longer.  Stand up tall and relax.  Repeat steps 1-7, this time changing the direction of the circles. 2. Repeat steps 1-8 with the other arm. STRETCHING EXERCISES Do these exercises 3-4 times per day on 5-6 days per week or as told by your health care provider. Work toward holding the stretch for 20 seconds. Stretching  Exercise 1 1. Lift your arm straight out in front of you. 2. Bend your arm 90 degrees at the elbow (right angle) so your forearm goes across your body and looks like the letter "L." 3. Use your other arm to gently pull the elbow forward and across your body. 4. Repeat steps 1-3 with the other arm. Stretching Exercise 2 You will need a towel or rope for this exercise. 1. Bend one arm behind your back with the palm facing outward. 2. Hold a towel with your other hand. 3. Reach the arm that holds the towel above your head, and bend that arm at the elbow. Your wrist should be behind your neck. 4. Use your free hand to grab the free end of the towel. 5. With the higher hand, gently pull the towel up behind you. 6. With the lower hand, pull the towel down behind you. 7. Repeat steps 1-6 with the other arm. STRENGTHENING EXERCISES Do each of these exercises at four different times of day (sessions) every day or as told by your health care provider. To begin with, repeat each exercise 5 times (repetitions). Work toward doing 3 sets of 12 repetitions or as told by your health care provider. Strengthening Exercise 1 You will need a light weight for this activity. As you grow stronger, you may use  a heavier weight. 1. Standing with a weight in your hand, lift your arm straight out to the side until it is at the same height as your shoulder. 2. Bend your arm at 90 degrees so that your fingers are pointing to the ceiling. 3. Slowly raise your hand until your arm is straight up in the air. 4. Repeat steps 1-3 with the other arm. Strengthening Exercise 2 You will need a light weight for this activity. As you grow stronger, you may use a heavier weight. 1. Standing with a weight in your hand, gradually move your straight arm in an arc, starting at your side, then out in front of you, then straight up over your head. 2. Gradually move your other arm in an arc, starting at your side, then out in front of you,  then straight up over your head. 3. Repeat steps 1-2 with the other arm. Strengthening Exercise 3 You will need an elastic band for this activity. As you grow stronger, gradually increase the size of the bands or increase the number of bands that you use at one time. 1. While standing, hold an elastic band in one hand and raise that arm up in the air. 2. With your other hand, pull down the band until that hand is by your side. 3. Repeat steps 1-2 with the other arm.   This information is not intended to replace advice given to you by your health care provider. Make sure you discuss any questions you have with your health care provider.   Document Released: 02/13/2003 Document Revised: 10/01/2014 Document Reviewed: 05/13/2014 Elsevier Interactive Patient Education [employer].

## 2015-08-05 ENCOUNTER — Telehealth: Payer: Self-pay

## 2015-08-05 LAB — MAGNESIUM: Magnesium: 1.7 mg/dL (ref 1.6–2.3)

## 2015-08-05 LAB — CK: Total CK: 63 U/L (ref 24–173)

## 2015-08-05 LAB — THYROID PANEL WITH TSH
Free Thyroxine Index: 2 (ref 1.2–4.9)
T3 UPTAKE RATIO: 29 % (ref 24–39)
T4 TOTAL: 6.9 ug/dL (ref 4.5–12.0)
TSH: 0.37 u[IU]/mL — AB (ref 0.450–4.500)

## 2015-08-05 NOTE — Telephone Encounter (Signed)
Advised pt of lab results. Pt verbally acknowledges understanding. Emily Drozdowski, CMA   

## 2015-08-05 NOTE — Telephone Encounter (Signed)
-----   Message from Margarita Rana, MD sent at 08/05/2015  7:18 AM EST ----- Thyroid ok.  Marked shows mildly overactive, but actual thyroid levels are fine. Recheck in 3 to 6 months.  Thanks.

## 2015-08-08 DIAGNOSIS — I872 Venous insufficiency (chronic) (peripheral): Secondary | ICD-10-CM | POA: Diagnosis not present

## 2015-08-08 DIAGNOSIS — I2119 ST elevation (STEMI) myocardial infarction involving other coronary artery of inferior wall: Secondary | ICD-10-CM | POA: Diagnosis not present

## 2015-08-08 DIAGNOSIS — Z0000 Encounter for general adult medical examination without abnormal findings: Secondary | ICD-10-CM | POA: Diagnosis not present

## 2015-08-08 DIAGNOSIS — I350 Nonrheumatic aortic (valve) stenosis: Secondary | ICD-10-CM | POA: Diagnosis not present

## 2015-08-08 DIAGNOSIS — I10 Essential (primary) hypertension: Secondary | ICD-10-CM | POA: Diagnosis not present

## 2015-10-06 ENCOUNTER — Ambulatory Visit (INDEPENDENT_AMBULATORY_CARE_PROVIDER_SITE_OTHER): Payer: PPO | Admitting: Family Medicine

## 2015-10-06 ENCOUNTER — Encounter: Payer: Self-pay | Admitting: Family Medicine

## 2015-10-06 VITALS — BP 104/64 | HR 84 | Temp 98.6°F | Resp 16 | Wt 136.0 lb

## 2015-10-06 DIAGNOSIS — B351 Tinea unguium: Secondary | ICD-10-CM

## 2015-10-06 DIAGNOSIS — E7800 Pure hypercholesterolemia, unspecified: Secondary | ICD-10-CM

## 2015-10-06 MED ORDER — ROSUVASTATIN CALCIUM 20 MG PO TABS: 10.0000 mg | ORAL_TABLET | Freq: Every day | ORAL | Status: DC

## 2015-10-06 MED ORDER — TERBINAFINE HCL 250 MG PO TABS: 250.0000 mg | ORAL_TABLET | Freq: Every day | ORAL | Status: DC

## 2015-10-06 NOTE — Progress Notes (Signed)
Patient ID: JACQULENE HUNTLEY, female   DOB: 05/01/1941, 75 y.o.   MRN: [id number]         Patient: Misty Page Female    DOB: 1941-04-01   75 y.o.   MRN: [id number] Visit Date: 10/06/2015  Today's Provider: Margarita Rana, MD   No chief complaint on file.  Subjective:    HPI   Onychomycosis: Patient complains of abnormal appearing fingernails.  Symptoms have been ongoing for about several months, and include increasing thickness, unsightliness. Previous treatment has included OTC treatment, including daily Vicks Vapo Rub.  No liver disease but is taking 58m of Crestor currently. Has no other concerns today.        Allergies  Allergen Reactions  . Codeine   . Levofloxacin   . Naproxen   . Penicillins     Rash  . Shellfish Allergy    Previous Medications   AMLODIPINE (NORVASC) 5 MG TABLET       ASCORBIC ACID (VITAMIN C) 1000 MG TABLET    Take 1,000 mg by mouth daily.    ASPIRIN 81 MG TABLET    Take 81 mg by mouth daily. ASPIRIN, 81MG (Oral Tablet)  1 Every Day for 0 days  Quantity: 0.00;  Refills: 0   Ordered :13-Apr-2010  ACelene Kras MA, Anastasiya ;  Started 29-October-2008 Active Comments: DX: 414.00   B COMPLEX VITAMINS PO    Take 1 tablet by mouth daily. B COMPLEX (Oral Capsule)  1 Every Day for 0 days  Quantity: 0.00;  Refills: 0   Ordered :13-Apr-2010  ACelene Kras MA, Anastasiya ;  Started 29-October-2008 Active Comments: DX: 414.00   CALCIUM-VITAMIN D-VITAMIN K 500-100-40 MG-UNT-MCG CHEW    Chew 1 tablet by mouth 2 (two) times daily. VIACTIV, 500-100-40 (Oral Tablet Chewable)  1 tablet twice a day for 0 days  Quantity: 0.00;  Refills: 0   Ordered :13-Apr-2010  ACelene Kras MA, Anastasiya ;  Started 29-October-2008 Active Comments: DX: 733.00   COENZYME Q10 (CO Q 10) 100 MG CAPS    Take 1 capsule by mouth daily.    CRANBERRY 300 MG TABLET    Take 300 mg by mouth daily.    FAMOTIDINE (PEPCID) 40 MG TABLET    Take 1 tablet (40 mg total) by mouth daily.   FERROUS SULFATE 325 (65 FE) MG  EC TABLET    Take 325 mg by mouth daily. FERROUS SULFATE, 325 (65 Fe)MG (Oral Tablet Delayed Release)  1 every day for 0 days  Quantity: 0.00;  Refills: 0   Ordered :13-Apr-2010  ACelene Kras MA, Anastasiya ;  Started 29-October-2008 Active Comments: DX: 285.9   FEXOFENADINE (ALLEGRA) 180 MG TABLET    Take 180 mg by mouth daily.    FLUTICASONE (FLONASE) 50 MCG/ACT NASAL SPRAY    Place 2 sprays into both nostrils daily.   HYDROCHLOROTHIAZIDE (HYDRODIURIL) 25 MG TABLET    Take 1 tablet (25 mg total) by mouth daily.   HYOSCYAMINE (LEVSIN, ANASPAZ) 0.125 MG TABLET    Take 0.125 mg by mouth every 4 (four) hours as needed.   LOSARTAN (COZAAR) 100 MG TABLET    Take 1 tablet (100 mg total) by mouth daily.   MONTELUKAST (SINGULAIR) 10 MG TABLET    Take 1 tablet (10 mg total) by mouth at bedtime.   MULTIPLE VITAMIN PO    Take 1 tablet by mouth daily. MULTIVITAMINS (Oral Tablet)  1 Every Day for 0 days  Quantity: 0.00;  Refills: 0   Ordered :13-Apr-2010  ACelene Kras  MA, Anastasiya ;  Started 29-October-2008 Active Comments: DX: 414.00   OMEGA-3 FATTY ACIDS (FISH OIL) 1000 MG CAPS    Take 1 capsule by mouth daily. FISH OIL CONCENTRATE, 1000MG (Oral Capsule)  1 Every Day for 0 days  Quantity: 0.00;  Refills: 0   Ordered :13-Apr-2010  Celene Kras, MA, Anastasiya ;  Started 29-October-2008 Active Comments: DX: 414.00   ROSUVASTATIN (CRESTOR) 20 MG TABLET    Take 1 tablet (20 mg total) by mouth daily.   SULFASALAZINE (AZULFIDINE) 500 MG TABLET    Take 1 tablet (500 mg total) by mouth 3 (three) times daily.    Review of Systems  Constitutional: Negative.   Skin: Negative for color change, pallor, rash and wound.    Social History  Substance Use Topics  . Smoking status: Former Smoker    Quit date: 05/30/1994  . Smokeless tobacco: Never Used  . Alcohol Use: No   Objective:   BP 104/64 mmHg  Pulse 84  Temp(Src) 98.6 F (37 C) (Oral)  Resp 16  Wt 136 lb (61.689 kg)  Physical Exam  Constitutional: She is  oriented to person, place, and time. She appears well-developed and well-nourished.  Musculoskeletal:  Thumb with thick raised nail.    Neurological: She is alert and oriented to person, place, and time.  Skin: Skin is warm and dry.  Psychiatric: She has a normal mood and affect. Her behavior is normal. Judgment and thought content normal.      Assessment & Plan:     1. Hypercholesteremia Stable; will reduce Crestor to 37m while on Lamisil.  - Comprehensive metabolic panel - rosuvastatin (CRESTOR) 20 MG tablet; Take 0.5 tablets (10 mg total) by mouth daily.  Dispense: 90 tablet; Refill: 3  2. Nail fungus New problem Worsening will try Lamisil for six weeks as below.  Check liver enzymes today and again four weeks after starting Lamisil.  - terbinafine (LAMISIL) 250 MG tablet; Take 1 tablet (250 mg total) by mouth daily.  Dispense: 30 tablet; Refill: 1 - Comprehensive metabolic panel  Patient was seen and examined by NJerrell Belfast MD, and note scribed by LAshley Royalty CMA.  I have reviewed the document for accuracy and completeness and I agree with above. - NJerrell Belfast MD      NMargarita Rana MD  [facility]

## 2015-10-08 DIAGNOSIS — E7800 Pure hypercholesterolemia, unspecified: Secondary | ICD-10-CM | POA: Diagnosis not present

## 2015-10-08 DIAGNOSIS — B351 Tinea unguium: Secondary | ICD-10-CM | POA: Diagnosis not present

## 2015-10-09 LAB — COMPREHENSIVE METABOLIC PANEL
A/G RATIO: 2 (ref 1.2–2.2)
ALBUMIN: 4.3 g/dL (ref 3.5–4.8)
ALT: 14 IU/L (ref 0–32)
AST: 24 IU/L (ref 0–40)
Alkaline Phosphatase: 51 IU/L (ref 39–117)
BUN / CREAT RATIO: 23 (ref 12–28)
BUN: 15 mg/dL (ref 8–27)
Bilirubin Total: 0.3 mg/dL (ref 0.0–1.2)
CALCIUM: 9.5 mg/dL (ref 8.7–10.3)
CO2: 24 mmol/L (ref 18–29)
Chloride: 99 mmol/L (ref 96–106)
Creatinine, Ser: 0.65 mg/dL (ref 0.57–1.00)
GFR, EST AFRICAN AMERICAN: 101 mL/min/{1.73_m2} (ref >59)
GFR, EST NON AFRICAN AMERICAN: 88 mL/min/{1.73_m2} (ref >59)
GLOBULIN, TOTAL: 2.2 g/dL (ref 1.5–4.5)
Glucose: 85 mg/dL (ref 65–99)
POTASSIUM: 4.4 mmol/L (ref 3.5–5.2)
SODIUM: 140 mmol/L (ref 134–144)
TOTAL PROTEIN: 6.5 g/dL (ref 6.0–8.5)

## 2015-11-13 ENCOUNTER — Other Ambulatory Visit: Payer: Self-pay

## 2015-11-13 DIAGNOSIS — K529 Noninfective gastroenteritis and colitis, unspecified: Secondary | ICD-10-CM

## 2015-11-13 MED ORDER — SULFASALAZINE 500 MG PO TABS: 1000.0000 mg | ORAL_TABLET | Freq: Two times a day (BID) | ORAL | Status: DC

## 2015-11-13 NOTE — Telephone Encounter (Signed)
Pharmacy faxed request stating pt informed that this medication has been changed to 2 tabs po BID. Renaldo Fiddler, CMA

## 2015-11-17 ENCOUNTER — Ambulatory Visit (INDEPENDENT_AMBULATORY_CARE_PROVIDER_SITE_OTHER): Payer: PPO | Admitting: Family Medicine

## 2015-11-17 VITALS — BP 106/60 | HR 80 | Temp 98.4°F | Resp 16 | Wt 135.0 lb

## 2015-11-17 DIAGNOSIS — M25511 Pain in right shoulder: Secondary | ICD-10-CM

## 2015-11-17 DIAGNOSIS — B351 Tinea unguium: Secondary | ICD-10-CM | POA: Diagnosis not present

## 2015-11-17 DIAGNOSIS — I10 Essential (primary) hypertension: Secondary | ICD-10-CM | POA: Diagnosis not present

## 2015-11-17 DIAGNOSIS — E0590 Thyrotoxicosis, unspecified without thyrotoxic crisis or storm: Secondary | ICD-10-CM | POA: Diagnosis not present

## 2015-11-17 DIAGNOSIS — K529 Noninfective gastroenteritis and colitis, unspecified: Secondary | ICD-10-CM

## 2015-11-17 DIAGNOSIS — E7800 Pure hypercholesterolemia, unspecified: Secondary | ICD-10-CM | POA: Diagnosis not present

## 2015-11-17 MED ORDER — TERBINAFINE HCL 250 MG PO TABS: 250.0000 mg | ORAL_TABLET | Freq: Every day | ORAL | Status: DC

## 2015-11-17 NOTE — Progress Notes (Signed)
Patient: Misty Page Female    DOB: Jan 22, 1941   75 y.o.   MRN: [id number] Visit Date: 11/17/2015  Today's Provider: Margarita Rana, MD   Chief Complaint  Patient presents with  . Hyperthyroidism  . Hyperlipidemia  . Hypertension   Subjective:    Hyperlipidemia This is a chronic problem. The problem is controlled. Recent lipid tests were reviewed and are normal. There are no known factors aggravating her hyperlipidemia. Pertinent negatives include no myalgias. There are no compliance problems.   Shoulder Pain  The pain is present in the right shoulder. This is a chronic problem. The current episode started more than 1 month ago. There has been no history of extremity trauma. The problem occurs constantly. The quality of the pain is described as aching and sharp. Pertinent negatives include no fever, joint locking, joint swelling, numbness, stiffness or tingling. Exacerbated by: Shoulder movement makes it worse. 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 The treatment provided no relief.    Lab Results  Component Value Date   CHOL 200* 12/17/2014   HDL 75 12/17/2014   LDLCALC 106* 12/17/2014   TRIG 94 12/17/2014   CHOLHDL 2.7 12/17/2014     Hypertension: Patient here for follow-up of elevated blood pressure. She is exercising and is adherent to low salt diet.  Blood pressure is well controlled at home. Cardiac symptoms none. Patient denies chest pain, irregular heart beat, lower extremity edema, syncope and tachypnea.  Cardiovascular risk factors: advanced age (older than 41 for men, 38 for women) and hypertension.   Hyperthyroidism: Patient presents with hyperthyroidism. Symptoms include None.  Symptoms have been present for several years. The patient denies drug abuse, diet pills, URI symptoms.   Allergies  Allergen Reactions  . Codeine   . Levofloxacin   . Naproxen   . Penicillins     Rash  . Shellfish Allergy    Current Meds  Medication Sig  . amLODipine (NORVASC) 5 MG  tablet   . Ascorbic Acid (VITAMIN C) 1000 MG tablet Take 1,000 mg by mouth daily.   Marland Kitchen aspirin 81 MG tablet Take 81 mg by mouth daily. ASPIRIN, 81MG (Oral Tablet)  1 Every Day for 0 days  Quantity: 0.00;  Refills: 0   Ordered :13-Apr-2010  Celene Kras, MA, Anastasiya ;  Started 29-October-2008 Active Comments: DX: 414.00  . B COMPLEX VITAMINS PO Take 1 tablet by mouth daily. B COMPLEX (Oral Capsule)  1 Every Day for 0 days  Quantity: 0.00;  Refills: 0   Ordered :13-Apr-2010  Celene Kras, MA, Anastasiya ;  Started 29-October-2008 Active Comments: DX: 414.00  . Calcium-Vitamin D-Vitamin K 500-100-40 MG-UNT-MCG CHEW Chew 1 tablet by mouth 2 (two) times daily. VIACTIV, 500-100-40 (Oral Tablet Chewable)  1 tablet twice a day for 0 days  Quantity: 0.00;  Refills: 0   Ordered :13-Apr-2010  Celene Kras, MA, Anastasiya ;  Started 29-October-2008 Active Comments: DX: 733.00  . Coenzyme Q10 (CO Q 10) 100 MG CAPS Take 1 capsule by mouth daily.   . Cranberry 300 MG tablet Take 300 mg by mouth daily.   . famotidine (PEPCID) 40 MG tablet Take 1 tablet (40 mg total) by mouth daily.  . ferrous sulfate 325 (65 FE) MG EC tablet Take 325 mg by mouth daily. FERROUS SULFATE, 325 (65 Fe)MG (Oral Tablet Delayed Release)  1 every day for 0 days  Quantity: 0.00;  Refills: 0   Ordered :13-Apr-2010  Celene Kras, MA, Anastasiya ;  Started 29-October-2008 Active Comments: DX: 285.9  .  fexofenadine (ALLEGRA) 180 MG tablet Take 180 mg by mouth daily.   . fluticasone (FLONASE) 50 MCG/ACT nasal spray Place 2 sprays into both nostrils daily.  . hydrochlorothiazide (HYDRODIURIL) 25 MG tablet Take 1 tablet (25 mg total) by mouth daily.  Marland Kitchen losartan (COZAAR) 100 MG tablet Take 1 tablet (100 mg total) by mouth daily.  . montelukast (SINGULAIR) 10 MG tablet Take 1 tablet (10 mg total) by mouth at bedtime.  . MULTIPLE VITAMIN PO Take 1 tablet by mouth daily. MULTIVITAMINS (Oral Tablet)  1 Every Day for 0 days  Quantity: 0.00;  Refills: 0   Ordered  :13-Apr-2010  Celene Kras, MA, Anastasiya ;  Started 29-October-2008 Active Comments: DX: 414.00  . Omega-3 Fatty Acids (FISH OIL) 1000 MG CAPS Take 1 capsule by mouth daily. FISH OIL CONCENTRATE, 1000MG (Oral Capsule)  1 Every Day for 0 days  Quantity: 0.00;  Refills: 0   Ordered :13-Apr-2010  Celene Kras, MA, Anastasiya ;  Started 29-October-2008 Active Comments: DX: 414.00  . rosuvastatin (CRESTOR) 20 MG tablet Take 0.5 tablets (10 mg total) by mouth daily.  Marland Kitchen terbinafine (LAMISIL) 250 MG tablet Take 1 tablet (250 mg total) by mouth daily.    Review of Systems  Constitutional: Negative for fever.  Respiratory: Negative.   Cardiovascular: Negative.   Gastrointestinal: Positive for abdominal pain, diarrhea and blood in stool. Negative for nausea, vomiting, constipation, abdominal distention, anal bleeding and rectal pain.       Having a flare of her colitis and hemorrhoid.    Musculoskeletal: Positive for arthralgias (Right shoulder pain.). Negative for myalgias, back pain, joint swelling, gait problem, stiffness, neck pain and neck stiffness.  Neurological: Negative for tingling and numbness.    Social History  Substance Use Topics  . Smoking status: Former Smoker    Quit date: 05/30/1994  . Smokeless tobacco: Never Used  . Alcohol Use: No   Objective:   BP 106/60 mmHg  Pulse 80  Temp(Src) 98.4 F (36.9 C) (Oral)  Resp 16  Wt 135 lb (61.236 kg)  Physical Exam  Constitutional: She is oriented to person, place, and time. She appears well-developed and well-nourished.  Neurological: She is alert and oriented to person, place, and time.  Skin: Skin is warm and dry.  Psychiatric: She has a normal mood and affect. Her behavior is normal. Judgment and thought content normal.      Assessment & Plan:     1. Essential (primary) hypertension Stable; continue current medications.  - CBC with Differential/Platelet  2. Hyperthyroidism Will recheck labs.  - CBC with  Differential/Platelet - Comprehensive metabolic panel - Lipid panel - TSH  3. Hypercholesteremia - CBC with Differential/Platelet - Comprehensive metabolic panel - Lipid panel  4. Colitis Recent flare; will refer to GI to evaluate and treat.  - Ambulatory referral to Gastroenterology  5. Nail fungus Improving; refills provided.  - terbinafine (LAMISIL) 250 MG tablet; Take 1 tablet (250 mg total) by mouth daily.  Dispense: 30 tablet; Refill: 1  6. Right shoulder pain Worsening; will refer to Physiatry to evaluate and treat.  - Ambulatory referral to Physical Medicine Rehab   Patient was seen and examined by Jerrell Belfast, MD, and note scribed by Ashley Royalty, CMA.   I have reviewed the document for accuracy and completeness and I agree with above. - Jerrell Belfast, MD        Margarita Rana, MD  [facility]

## 2015-11-18 DIAGNOSIS — I10 Essential (primary) hypertension: Secondary | ICD-10-CM | POA: Diagnosis not present

## 2015-11-18 DIAGNOSIS — E0590 Thyrotoxicosis, unspecified without thyrotoxic crisis or storm: Secondary | ICD-10-CM | POA: Diagnosis not present

## 2015-11-18 DIAGNOSIS — E7800 Pure hypercholesterolemia, unspecified: Secondary | ICD-10-CM | POA: Diagnosis not present

## 2015-11-19 ENCOUNTER — Telehealth: Payer: Self-pay

## 2015-11-19 LAB — COMPREHENSIVE METABOLIC PANEL
ALBUMIN: 4.3 g/dL (ref 3.5–4.8)
ALK PHOS: 55 IU/L (ref 39–117)
ALT: 15 IU/L (ref 0–32)
AST: 18 IU/L (ref 0–40)
Albumin/Globulin Ratio: 2 (ref 1.2–2.2)
BUN / CREAT RATIO: 17 (ref 12–28)
BUN: 11 mg/dL (ref 8–27)
CHLORIDE: 99 mmol/L (ref 96–106)
CO2: 25 mmol/L (ref 18–29)
Calcium: 9.2 mg/dL (ref 8.7–10.3)
Creatinine, Ser: 0.64 mg/dL (ref 0.57–1.00)
GFR calc non Af Amer: 88 mL/min/{1.73_m2} (ref >59)
GFR, EST AFRICAN AMERICAN: 102 mL/min/{1.73_m2} (ref >59)
GLUCOSE: 93 mg/dL (ref 65–99)
Globulin, Total: 2.1 g/dL (ref 1.5–4.5)
POTASSIUM: 4.7 mmol/L (ref 3.5–5.2)
Sodium: 139 mmol/L (ref 134–144)
TOTAL PROTEIN: 6.4 g/dL (ref 6.0–8.5)

## 2015-11-19 LAB — CBC WITH DIFFERENTIAL/PLATELET
BASOS ABS: 0 10*3/uL (ref 0.0–0.2)
BASOS: 1 %
EOS (ABSOLUTE): 0.1 10*3/uL (ref 0.0–0.4)
Eos: 3 %
HEMOGLOBIN: 12.5 g/dL (ref 11.1–15.9)
Hematocrit: 36.4 % (ref 34.0–46.6)
IMMATURE GRANS (ABS): 0 10*3/uL (ref 0.0–0.1)
Immature Granulocytes: 0 %
LYMPHS ABS: 1.2 10*3/uL (ref 0.7–3.1)
LYMPHS: 23 %
MCH: 32.6 pg (ref 26.6–33.0)
MCHC: 34.3 g/dL (ref 31.5–35.7)
MCV: 95 fL (ref 79–97)
Monocytes Absolute: 0.6 10*3/uL (ref 0.1–0.9)
Monocytes: 11 %
NEUTROS ABS: 3.2 10*3/uL (ref 1.4–7.0)
Neutrophils: 62 %
PLATELETS: 346 10*3/uL (ref 150–379)
RBC: 3.83 x10E6/uL (ref 3.77–5.28)
RDW: 13.7 % (ref 12.3–15.4)
WBC: 5.1 10*3/uL (ref 3.4–10.8)

## 2015-11-19 LAB — LIPID PANEL
Chol/HDL Ratio: 2.5 ratio units (ref 0.0–4.4)
Cholesterol, Total: 191 mg/dL (ref 100–199)
HDL: 76 mg/dL (ref >39)
LDL Calculated: 97 mg/dL (ref 0–99)
Triglycerides: 92 mg/dL (ref 0–149)
VLDL Cholesterol Cal: 18 mg/dL (ref 5–40)

## 2015-11-19 LAB — TSH: TSH: 0.514 u[IU]/mL (ref 0.450–4.500)

## 2015-11-19 NOTE — Telephone Encounter (Signed)
Pt advised.   Thanks,   -Laura  

## 2015-11-19 NOTE — Telephone Encounter (Signed)
-----   Message from Margarita Rana, MD sent at 11/19/2015 11:12 AM EDT ----- Labs all normal. Please notify patient. Thanks.

## 2015-11-24 DIAGNOSIS — K219 Gastro-esophageal reflux disease without esophagitis: Secondary | ICD-10-CM | POA: Diagnosis not present

## 2015-11-24 DIAGNOSIS — Z8719 Personal history of other diseases of the digestive system: Secondary | ICD-10-CM | POA: Diagnosis not present

## 2015-12-15 DIAGNOSIS — I6523 Occlusion and stenosis of bilateral carotid arteries: Secondary | ICD-10-CM | POA: Diagnosis not present

## 2015-12-15 DIAGNOSIS — I6521 Occlusion and stenosis of right carotid artery: Secondary | ICD-10-CM | POA: Diagnosis not present

## 2015-12-15 DIAGNOSIS — I872 Venous insufficiency (chronic) (peripheral): Secondary | ICD-10-CM | POA: Diagnosis not present

## 2015-12-15 DIAGNOSIS — I739 Peripheral vascular disease, unspecified: Secondary | ICD-10-CM | POA: Diagnosis not present

## 2015-12-15 DIAGNOSIS — E785 Hyperlipidemia, unspecified: Secondary | ICD-10-CM | POA: Diagnosis not present

## 2015-12-15 DIAGNOSIS — I10 Essential (primary) hypertension: Secondary | ICD-10-CM | POA: Diagnosis not present

## 2015-12-15 DIAGNOSIS — I701 Atherosclerosis of renal artery: Secondary | ICD-10-CM | POA: Diagnosis not present

## 2015-12-15 DIAGNOSIS — I6529 Occlusion and stenosis of unspecified carotid artery: Secondary | ICD-10-CM | POA: Diagnosis not present

## 2016-01-05 DIAGNOSIS — M7541 Impingement syndrome of right shoulder: Secondary | ICD-10-CM | POA: Diagnosis not present

## 2016-01-20 DIAGNOSIS — G8929 Other chronic pain: Secondary | ICD-10-CM | POA: Diagnosis not present

## 2016-01-20 DIAGNOSIS — M25511 Pain in right shoulder: Secondary | ICD-10-CM | POA: Diagnosis not present

## 2016-01-27 DIAGNOSIS — E785 Hyperlipidemia, unspecified: Secondary | ICD-10-CM | POA: Diagnosis not present

## 2016-01-27 DIAGNOSIS — I10 Essential (primary) hypertension: Secondary | ICD-10-CM | POA: Diagnosis not present

## 2016-01-27 DIAGNOSIS — I872 Venous insufficiency (chronic) (peripheral): Secondary | ICD-10-CM | POA: Diagnosis not present

## 2016-01-27 DIAGNOSIS — I6523 Occlusion and stenosis of bilateral carotid arteries: Secondary | ICD-10-CM | POA: Diagnosis not present

## 2016-01-27 DIAGNOSIS — I701 Atherosclerosis of renal artery: Secondary | ICD-10-CM | POA: Diagnosis not present

## 2016-01-27 DIAGNOSIS — I6529 Occlusion and stenosis of unspecified carotid artery: Secondary | ICD-10-CM | POA: Diagnosis not present

## 2016-01-27 DIAGNOSIS — G8929 Other chronic pain: Secondary | ICD-10-CM | POA: Diagnosis not present

## 2016-01-27 DIAGNOSIS — I739 Peripheral vascular disease, unspecified: Secondary | ICD-10-CM | POA: Diagnosis not present

## 2016-01-27 DIAGNOSIS — I6521 Occlusion and stenosis of right carotid artery: Secondary | ICD-10-CM | POA: Diagnosis not present

## 2016-01-27 DIAGNOSIS — M25511 Pain in right shoulder: Secondary | ICD-10-CM | POA: Diagnosis not present

## 2016-02-04 DIAGNOSIS — G8929 Other chronic pain: Secondary | ICD-10-CM | POA: Diagnosis not present

## 2016-02-04 DIAGNOSIS — M25511 Pain in right shoulder: Secondary | ICD-10-CM | POA: Diagnosis not present

## 2016-02-07 ENCOUNTER — Other Ambulatory Visit: Payer: Self-pay | Admitting: Family Medicine

## 2016-02-07 DIAGNOSIS — B351 Tinea unguium: Secondary | ICD-10-CM

## 2016-02-10 DIAGNOSIS — M25511 Pain in right shoulder: Secondary | ICD-10-CM | POA: Diagnosis not present

## 2016-02-10 DIAGNOSIS — G8929 Other chronic pain: Secondary | ICD-10-CM | POA: Diagnosis not present

## 2016-02-18 DIAGNOSIS — M7541 Impingement syndrome of right shoulder: Secondary | ICD-10-CM | POA: Diagnosis not present

## 2016-02-20 ENCOUNTER — Ambulatory Visit: Payer: PPO | Admitting: Physician Assistant

## 2016-03-01 DIAGNOSIS — I2510 Atherosclerotic heart disease of native coronary artery without angina pectoris: Secondary | ICD-10-CM | POA: Diagnosis not present

## 2016-03-01 DIAGNOSIS — I10 Essential (primary) hypertension: Secondary | ICD-10-CM | POA: Diagnosis not present

## 2016-03-01 DIAGNOSIS — I350 Nonrheumatic aortic (valve) stenosis: Secondary | ICD-10-CM | POA: Diagnosis not present

## 2016-03-01 DIAGNOSIS — E782 Mixed hyperlipidemia: Secondary | ICD-10-CM | POA: Diagnosis not present

## 2016-03-12 ENCOUNTER — Ambulatory Visit (INDEPENDENT_AMBULATORY_CARE_PROVIDER_SITE_OTHER): Payer: PPO | Admitting: Physician Assistant

## 2016-03-12 ENCOUNTER — Encounter: Payer: Self-pay | Admitting: Physician Assistant

## 2016-03-12 VITALS — BP 100/50 | HR 80 | Temp 98.3°F | Resp 16 | Wt 134.0 lb

## 2016-03-12 DIAGNOSIS — Z79899 Other long term (current) drug therapy: Secondary | ICD-10-CM | POA: Diagnosis not present

## 2016-03-12 DIAGNOSIS — Z23 Encounter for immunization: Secondary | ICD-10-CM

## 2016-03-12 DIAGNOSIS — E7800 Pure hypercholesterolemia, unspecified: Secondary | ICD-10-CM | POA: Diagnosis not present

## 2016-03-12 DIAGNOSIS — I10 Essential (primary) hypertension: Secondary | ICD-10-CM | POA: Diagnosis not present

## 2016-03-12 DIAGNOSIS — E0590 Thyrotoxicosis, unspecified without thyrotoxic crisis or storm: Secondary | ICD-10-CM

## 2016-03-12 DIAGNOSIS — I739 Peripheral vascular disease, unspecified: Secondary | ICD-10-CM | POA: Diagnosis not present

## 2016-03-12 DIAGNOSIS — K529 Noninfective gastroenteritis and colitis, unspecified: Secondary | ICD-10-CM | POA: Diagnosis not present

## 2016-03-12 NOTE — Patient Instructions (Signed)
DASH Eating Plan  DASH stands for "Dietary Approaches to Stop Hypertension." The DASH eating plan is a healthy eating plan that has been shown to reduce high blood pressure (hypertension). Additional health benefits may include reducing the risk of type 2 diabetes mellitus, heart disease, and stroke. The DASH eating plan may also help with weight loss.  WHAT DO I NEED TO KNOW ABOUT THE DASH EATING PLAN?  For the DASH eating plan, you will follow these general guidelines:  · Choose foods with a percent daily value for sodium of less than 5% (as listed on the food label).  · Use salt-free seasonings or herbs instead of table salt or sea salt.  · Check with your health care provider or pharmacist before using salt substitutes.  · Eat lower-sodium products, often labeled as "lower sodium" or "no salt added."  · Eat fresh foods.  · Eat more vegetables, fruits, and low-fat dairy products.  · Choose whole grains. Look for the word "whole" as the first word in the ingredient list.  · Choose fish and skinless chicken or turkey more often than red meat. Limit fish, poultry, and meat to 6 oz (170 g) each day.  · Limit sweets, desserts, sugars, and sugary drinks.  · Choose heart-healthy fats.  · Limit cheese to 1 oz (28 g) per day.  · Eat more home-cooked food and less restaurant, buffet, and fast food.  · Limit fried foods.  · Cook foods using methods other than frying.  · Limit canned vegetables. If you do use them, rinse them well to decrease the sodium.  · When eating at a restaurant, ask that your food be prepared with less salt, or no salt if possible.  WHAT FOODS CAN I EAT?  Seek help from a dietitian for individual calorie needs.  Grains  Whole grain or whole wheat bread. Brown rice. Whole grain or whole wheat pasta. Quinoa, bulgur, and whole grain cereals. Low-sodium cereals. Corn or whole wheat flour tortillas. Whole grain cornbread. Whole grain crackers. Low-sodium crackers.  Vegetables  Fresh or frozen vegetables  (raw, steamed, roasted, or grilled). Low-sodium or reduced-sodium tomato and vegetable juices. Low-sodium or reduced-sodium tomato sauce and paste. Low-sodium or reduced-sodium canned vegetables.   Fruits  All fresh, canned (in natural juice), or frozen fruits.  Meat and Other Protein Products  Ground beef (85% or leaner), grass-fed beef, or beef trimmed of fat. Skinless chicken or turkey. Ground chicken or turkey. Pork trimmed of fat. All fish and seafood. Eggs. Dried beans, peas, or lentils. Unsalted nuts and seeds. Unsalted canned beans.  Dairy  Low-fat dairy products, such as skim or 1% milk, 2% or reduced-fat cheeses, low-fat ricotta or cottage cheese, or plain low-fat yogurt. Low-sodium or reduced-sodium cheeses.  Fats and Oils  Tub margarines without trans fats. Light or reduced-fat mayonnaise and salad dressings (reduced sodium). Avocado. Safflower, olive, or canola oils. Natural peanut or almond butter.  Other  Unsalted popcorn and pretzels.  The items listed above may not be a complete list of recommended foods or beverages. Contact your dietitian for more options.  WHAT FOODS ARE NOT RECOMMENDED?  Grains  White bread. White pasta. White rice. Refined cornbread. Bagels and croissants. Crackers that contain trans fat.  Vegetables  Creamed or fried vegetables. Vegetables in a cheese sauce. Regular canned vegetables. Regular canned tomato sauce and paste. Regular tomato and vegetable juices.  Fruits  Dried fruits. Canned fruit in light or heavy syrup. Fruit juice.  Meat and Other Protein   Products  Fatty cuts of meat. Ribs, chicken wings, bacon, sausage, bologna, salami, chitterlings, fatback, hot dogs, bratwurst, and packaged luncheon meats. Salted nuts and seeds. Canned beans with salt.  Dairy  Whole or 2% milk, cream, half-and-half, and cream cheese. Whole-fat or sweetened yogurt. Full-fat cheeses or blue cheese. Nondairy creamers and whipped toppings. Processed cheese, cheese spreads, or cheese  curds.  Condiments  Onion and garlic salt, seasoned salt, table salt, and sea salt. Canned and packaged gravies. Worcestershire sauce. Tartar sauce. Barbecue sauce. Teriyaki sauce. Soy sauce, including reduced sodium. Steak sauce. Fish sauce. Oyster sauce. Cocktail sauce. Horseradish. Ketchup and mustard. Meat flavorings and tenderizers. Bouillon cubes. Hot sauce. Tabasco sauce. Marinades. Taco seasonings. Relishes.  Fats and Oils  Butter, stick margarine, lard, shortening, ghee, and bacon fat. Coconut, palm kernel, or palm oils. Regular salad dressings.  Other  Pickles and olives. Salted popcorn and pretzels.  The items listed above may not be a complete list of foods and beverages to avoid. Contact your dietitian for more information.  WHERE CAN I FIND MORE INFORMATION?  National Heart, [employer], and [employer] Institute: www.nhlbi.nih.gov/health/health-topics/topics/dash/     This information is not intended to replace advice given to you by your health care provider. Make sure you discuss any questions you have with your health care provider.     Document Released: 05/06/2011 Document Revised: 06/07/2014 Document Reviewed: 03/21/2013  [employer]vier Interactive Patient Education ©2016 [employer]

## 2016-03-12 NOTE — Progress Notes (Signed)
Patient: Misty Page Female    DOB: May 22, 1941   75 y.o.   MRN: [id number] Visit Date: 03/12/2016  Today's Provider: Mar Daring, [name]   Chief Complaint  Patient presents with  . Hypertension  . Hypothyroidism  . Hyperlipidemia   Subjective:    HPI   Hypertension, follow-up:  BP Readings from Last 3 Encounters:  03/12/16 (!) 100/50  11/17/15 106/60  10/06/15 104/64    She was last seen for hypertension 3 months ago.  BP at that visit was 106/60 . Management changes since that visit include no changes. She reports excellent compliance with treatment. She is not having side effects.  She is exercising. She is adherent to low salt diet.   Outside blood pressures are stable. She is experiencing none.  Patient denies chest pain.   Cardiovascular risk factors include none.  Use of agents associated with hypertension: none.     Weight trend: stable Wt Readings from Last 3 Encounters:  03/12/16 134 lb (60.8 kg)  11/17/15 135 lb (61.2 kg)  10/06/15 136 lb (61.7 kg)    Current diet: in general, a "healthy" diet    ------------------------------------------------------------------------   Hyperthyroidism, follow-up:  TSH  Date Value Ref Range Status  11/18/2015 0.514 0.450 - 4.500 uIU/mL Final  08/04/2015 0.370 (L) 0.450 - 4.500 uIU/mL Final  12/17/2014 0.691 0.450 - 4.500 uIU/mL Final   Wt Readings from Last 3 Encounters:  03/12/16 134 lb (60.8 kg)  11/17/15 135 lb (61.2 kg)  10/06/15 136 lb (61.7 kg)    She was last seen for hypothyroid 3 months ago.  Management since that visit includes no changes. She reports excellent compliance with treatment. She is not having side effects.  She is exercising. She is experiencing none She denies change in energy level Weight trend: stable  ------------------------------------------------------------------------   Lipid/Cholesterol, Follow-up:   Last seen for this3 months ago.  Management  changes since that visit include no changes. . Last Lipid Panel:    Component Value Date/Time   CHOL 191 11/18/2015 0814   TRIG 92 11/18/2015 0814   HDL 76 11/18/2015 0814   CHOLHDL 2.5 11/18/2015 0814   LDLCALC 97 11/18/2015 0814    Risk factors for vascular disease include hypertension  She reports excellent compliance with treatment. She is not having side effects.  Current symptoms include none and have been stable. Weight trend: stable Prior visit with dietician: no Current diet: in general, a "healthy" diet   Current exercise: bowling  Wt Readings from Last 3 Encounters:  03/12/16 134 lb (60.8 kg)  11/17/15 135 lb (61.2 kg)  10/06/15 136 lb (61.7 kg)    -------------------------------------------------------------------     Allergies  Allergen Reactions  . Codeine   . Levofloxacin   . Naproxen   . Penicillins     Rash  . Shellfish Allergy      Current Outpatient Prescriptions:  .  amLODipine (NORVASC) 5 MG tablet, , Disp: , Rfl:  .  Ascorbic Acid (VITAMIN C) 1000 MG tablet, Take 1,000 mg by mouth daily. , Disp: , Rfl:  .  aspirin 81 MG tablet, Take 81 mg by mouth daily. ASPIRIN, 81MG (Oral Tablet)  1 Every Day for 0 days  Quantity: 0.00;  Refills: 0   Ordered :13-Apr-2010  Celene Kras, MA, Anastasiya ;  Started 29-October-2008 Active Comments: DX: 414.00, Disp: , Rfl:  .  B COMPLEX VITAMINS PO, Take 1 tablet by mouth daily. B COMPLEX (Oral Capsule)  1 Every Day for 0 days  Quantity: 0.00;  Refills: 0   Ordered :13-Apr-2010  Celene Kras, MA, Anastasiya ;  Started 29-October-2008 Active Comments: DX: 414.00, Disp: , Rfl:  .  Calcium-Vitamin D-Vitamin K 500-100-40 MG-UNT-MCG CHEW, Chew 1 tablet by mouth 2 (two) times daily. VIACTIV, 500-100-40 (Oral Tablet Chewable)  1 tablet twice a day for 0 days  Quantity: 0.00;  Refills: 0   Ordered :13-Apr-2010  Celene Kras, MA, Anastasiya ;  Started 29-October-2008 Active Comments: DX: 733.00, Disp: , Rfl:  .  Coenzyme Q10 (CO Q 10)  100 MG CAPS, Take 1 capsule by mouth daily. , Disp: , Rfl:  .  Cranberry 300 MG tablet, Take 300 mg by mouth daily. , Disp: , Rfl:  .  famotidine (PEPCID) 40 MG tablet, Take 1 tablet (40 mg total) by mouth daily., Disp: 90 tablet, Rfl: 3 .  ferrous sulfate 325 (65 FE) MG EC tablet, Take 325 mg by mouth daily. FERROUS SULFATE, 325 (65 Fe)MG (Oral Tablet Delayed Release)  1 every day for 0 days  Quantity: 0.00;  Refills: 0   Ordered :13-Apr-2010  Celene Kras, MA, Anastasiya ;  Started 29-October-2008 Active Comments: DX: 285.9, Disp: , Rfl:  .  fexofenadine (ALLEGRA) 180 MG tablet, Take 180 mg by mouth daily. , Disp: , Rfl:  .  fluticasone (FLONASE) 50 MCG/ACT nasal spray, Place 2 sprays into both nostrils daily., Disp: 48 g, Rfl: 3 .  hydrochlorothiazide (HYDRODIURIL) 25 MG tablet, Take 1 tablet (25 mg total) by mouth daily., Disp: 90 tablet, Rfl: 3 .  losartan (COZAAR) 100 MG tablet, Take 1 tablet (100 mg total) by mouth daily., Disp: 90 tablet, Rfl: 3 .  montelukast (SINGULAIR) 10 MG tablet, Take 1 tablet (10 mg total) by mouth at bedtime., Disp: 90 tablet, Rfl: 3 .  MULTIPLE VITAMIN PO, Take 1 tablet by mouth daily. MULTIVITAMINS (Oral Tablet)  1 Every Day for 0 days  Quantity: 0.00;  Refills: 0   Ordered :13-Apr-2010  Celene Kras, MA, Anastasiya ;  Started 29-October-2008 Active Comments: DX: 414.00, Disp: , Rfl:  .  Omega-3 Fatty Acids (FISH OIL) 1000 MG CAPS, Take 1 capsule by mouth daily. FISH OIL CONCENTRATE, 1000MG (Oral Capsule)  1 Every Day for 0 days  Quantity: 0.00;  Refills: 0   Ordered :13-Apr-2010  Celene Kras, MA, Anastasiya ;  Started 29-October-2008 Active Comments: DX: 414.00, Disp: , Rfl:  .  rosuvastatin (CRESTOR) 20 MG tablet, Take 0.5 tablets (10 mg total) by mouth daily., Disp: 90 tablet, Rfl: 3 .  terbinafine (LAMISIL) 250 MG tablet, TAKE ONE (1) TABLET EACH DAY, Disp: 30 tablet, Rfl: 3 .  hyoscyamine (LEVSIN, ANASPAZ) 0.125 MG tablet, Take 0.125 mg by mouth every 4 (four) hours as  needed., Disp: , Rfl:   Review of Systems  Constitutional: Negative.   Respiratory: Negative.   Cardiovascular: Negative.   Gastrointestinal: Negative.   Endocrine: Negative.   Neurological: Negative.     Social History  Substance Use Topics  . Smoking status: Former Smoker    Quit date: 05/30/1994  . Smokeless tobacco: Never Used  . Alcohol use No   Objective:   BP (!) 100/50 (BP Location: Left Arm, Patient Position: Sitting, Cuff Size: Large)   Pulse 80   Temp 98.3 F (36.8 C) (Oral)   Resp 16   Wt 134 lb (60.8 kg)   BMI 25.32 kg/m   Physical Exam  Constitutional: She appears well-developed and well-nourished. No distress.  Neck: Normal range of motion. Neck  supple. No tracheal deviation present. No thyromegaly present.  Cardiovascular: Normal rate and regular rhythm.  Exam reveals no gallop and no friction rub.   Murmur heard.  Systolic murmur is present with a grade of 3/6  Pulmonary/Chest: Effort normal and breath sounds normal. No respiratory distress. She has no wheezes. She has no rales.  Musculoskeletal: She exhibits no edema.  Lymphadenopathy:    She has no cervical adenopathy.  Skin: She is not diaphoretic.  Vitals reviewed.     Assessment & Plan:     1. Colitis Patient had been switched to hycosamine but was switched back to mesalamine due to ineffectiveness. Had more frequent flares. She is following up with GI in December. - Mesalamine (ASACOL) 400 MG CPDR DR capsule; Take 2 capsules (800 mg total) by mouth 2 (two) times daily.  Dispense: 120 capsule; Refill: 0  2. Essential (primary) hypertension Stable on amlodipine 7m, losartan 1027mand HCTZ 2543mContinue current medical treatment plan.  3. Peripheral blood vessel disorder ([place] SchDelana Meyeras had 5 peripheral lower extremity stents, one renal stent, and one cardiac stent plus bilateral carotid endarterectomies. Most recent US Koreal were patent. Patient continues ASA 53m27mily.    4. Hypercholesteremia Last labs 3 months ago were stable. Continue crestor 10mg11mly while on lamisil (will increase back to 20mg 70m off lamisil).  5. Hyperthyroidism Last labs were stable. Will recheck with other labs in January at [facility].  6. High risk medication use Will check labs for lamisil. Will f/u pending lab results. Nail is improving and about 80% better. Will continue for the next 3 months and will hopefully be able to discontinue.  - CBC with Differential - Comprehensive Metabolic Panel (CMET)  7. Need for influenza vaccination Flu vaccine given today without complication. Patient sat upright for 15 minutes to check for adverse reaction before being released.       JennifMar Daring  [facility]

## 2016-03-13 LAB — CBC WITH DIFFERENTIAL/PLATELET
BASOS: 0 %
Basophils Absolute: 0 10*3/uL (ref 0.0–0.2)
EOS (ABSOLUTE): 0.2 10*3/uL (ref 0.0–0.4)
EOS: 3 %
HEMATOCRIT: 40.1 % (ref 34.0–46.6)
HEMOGLOBIN: 13.6 g/dL (ref 11.1–15.9)
Immature Grans (Abs): 0 10*3/uL (ref 0.0–0.1)
Immature Granulocytes: 0 %
LYMPHS ABS: 1.4 10*3/uL (ref 0.7–3.1)
Lymphs: 19 %
MCH: 31.2 pg (ref 26.6–33.0)
MCHC: 33.9 g/dL (ref 31.5–35.7)
MCV: 92 fL (ref 79–97)
MONOCYTES: 10 %
MONOS ABS: 0.8 10*3/uL (ref 0.1–0.9)
Neutrophils Absolute: 5.1 10*3/uL (ref 1.4–7.0)
Neutrophils: 68 %
Platelets: 376 10*3/uL (ref 150–379)
RBC: 4.36 x10E6/uL (ref 3.77–5.28)
RDW: 13.5 % (ref 12.3–15.4)
WBC: 7.5 10*3/uL (ref 3.4–10.8)

## 2016-03-13 LAB — COMPREHENSIVE METABOLIC PANEL
A/G RATIO: 2.3 — AB (ref 1.2–2.2)
ALBUMIN: 4.5 g/dL (ref 3.5–4.8)
ALK PHOS: 56 IU/L (ref 39–117)
ALT: 15 IU/L (ref 0–32)
AST: 18 IU/L (ref 0–40)
BUN / CREAT RATIO: 25 (ref 12–28)
BUN: 16 mg/dL (ref 8–27)
Bilirubin Total: <0.2 mg/dL (ref 0.0–1.2)
CALCIUM: 9.8 mg/dL (ref 8.7–10.3)
CO2: 26 mmol/L (ref 18–29)
CREATININE: 0.63 mg/dL (ref 0.57–1.00)
Chloride: 97 mmol/L (ref 96–106)
GFR calc Af Amer: 101 mL/min/{1.73_m2} (ref >59)
GFR, EST NON AFRICAN AMERICAN: 88 mL/min/{1.73_m2} (ref >59)
GLOBULIN, TOTAL: 2 g/dL (ref 1.5–4.5)
Glucose: 115 mg/dL — ABNORMAL HIGH (ref 65–99)
POTASSIUM: 4 mmol/L (ref 3.5–5.2)
SODIUM: 141 mmol/L (ref 134–144)
Total Protein: 6.5 g/dL (ref 6.0–8.5)

## 2016-03-15 ENCOUNTER — Telehealth: Payer: Self-pay

## 2016-03-15 NOTE — Telephone Encounter (Signed)
Patient advised as below.  

## 2016-03-15 NOTE — Telephone Encounter (Signed)
-----   Message from Mar Daring, [name] sent at 03/14/2016  9:26 AM EDT ----- All labs are within normal limits and stable.  Thanks! -JB

## 2016-03-22 ENCOUNTER — Other Ambulatory Visit: Payer: Self-pay | Admitting: Family Medicine

## 2016-03-22 DIAGNOSIS — K219 Gastro-esophageal reflux disease without esophagitis: Secondary | ICD-10-CM

## 2016-04-05 DIAGNOSIS — D2262 Melanocytic nevi of left upper limb, including shoulder: Secondary | ICD-10-CM | POA: Diagnosis not present

## 2016-04-05 DIAGNOSIS — D2271 Melanocytic nevi of right lower limb, including hip: Secondary | ICD-10-CM | POA: Diagnosis not present

## 2016-04-05 DIAGNOSIS — Z85828 Personal history of other malignant neoplasm of skin: Secondary | ICD-10-CM | POA: Diagnosis not present

## 2016-04-05 DIAGNOSIS — D225 Melanocytic nevi of trunk: Secondary | ICD-10-CM | POA: Diagnosis not present

## 2016-04-09 DIAGNOSIS — M7581 Other shoulder lesions, right shoulder: Secondary | ICD-10-CM | POA: Diagnosis not present

## 2016-04-09 DIAGNOSIS — M7541 Impingement syndrome of right shoulder: Secondary | ICD-10-CM | POA: Diagnosis not present

## 2016-04-16 DIAGNOSIS — H26491 Other secondary cataract, right eye: Secondary | ICD-10-CM | POA: Diagnosis not present

## 2016-05-11 DIAGNOSIS — I350 Nonrheumatic aortic (valve) stenosis: Secondary | ICD-10-CM | POA: Diagnosis not present

## 2016-05-11 DIAGNOSIS — I2510 Atherosclerotic heart disease of native coronary artery without angina pectoris: Secondary | ICD-10-CM | POA: Diagnosis not present

## 2016-05-17 DIAGNOSIS — I350 Nonrheumatic aortic (valve) stenosis: Secondary | ICD-10-CM | POA: Diagnosis not present

## 2016-05-17 DIAGNOSIS — I2510 Atherosclerotic heart disease of native coronary artery without angina pectoris: Secondary | ICD-10-CM | POA: Diagnosis not present

## 2016-05-17 DIAGNOSIS — I10 Essential (primary) hypertension: Secondary | ICD-10-CM | POA: Diagnosis not present

## 2016-05-17 DIAGNOSIS — M7581 Other shoulder lesions, right shoulder: Secondary | ICD-10-CM | POA: Diagnosis not present

## 2016-05-18 DIAGNOSIS — K219 Gastro-esophageal reflux disease without esophagitis: Secondary | ICD-10-CM | POA: Diagnosis not present

## 2016-05-18 DIAGNOSIS — R197 Diarrhea, unspecified: Secondary | ICD-10-CM | POA: Diagnosis not present

## 2016-05-18 DIAGNOSIS — Z8719 Personal history of other diseases of the digestive system: Secondary | ICD-10-CM | POA: Diagnosis not present

## 2016-06-07 ENCOUNTER — Other Ambulatory Visit: Payer: Self-pay | Admitting: Physician Assistant

## 2016-06-07 ENCOUNTER — Other Ambulatory Visit: Payer: Self-pay | Admitting: Family Medicine

## 2016-06-07 DIAGNOSIS — I10 Essential (primary) hypertension: Secondary | ICD-10-CM

## 2016-06-07 DIAGNOSIS — B351 Tinea unguium: Secondary | ICD-10-CM

## 2016-06-07 NOTE — Telephone Encounter (Signed)
LOV 03/12/2016.Labs were checked at that time. Renaldo Fiddler, CMA

## 2016-06-07 NOTE — Telephone Encounter (Signed)
Has appointment 06/21/2016. Renaldo Fiddler, CMA

## 2016-06-11 DIAGNOSIS — M75121 Complete rotator cuff tear or rupture of right shoulder, not specified as traumatic: Secondary | ICD-10-CM | POA: Diagnosis not present

## 2016-06-11 DIAGNOSIS — M7581 Other shoulder lesions, right shoulder: Secondary | ICD-10-CM | POA: Diagnosis not present

## 2016-06-11 DIAGNOSIS — M7541 Impingement syndrome of right shoulder: Secondary | ICD-10-CM | POA: Diagnosis not present

## 2016-06-14 ENCOUNTER — Other Ambulatory Visit: Payer: Self-pay | Admitting: Surgery

## 2016-06-14 ENCOUNTER — Other Ambulatory Visit: Payer: Self-pay | Admitting: Family Medicine

## 2016-06-14 DIAGNOSIS — M7581 Other shoulder lesions, right shoulder: Secondary | ICD-10-CM

## 2016-06-14 DIAGNOSIS — M7541 Impingement syndrome of right shoulder: Secondary | ICD-10-CM

## 2016-06-14 NOTE — Telephone Encounter (Signed)
Please review-aa 

## 2016-06-21 ENCOUNTER — Ambulatory Visit (INDEPENDENT_AMBULATORY_CARE_PROVIDER_SITE_OTHER): Payer: PPO | Admitting: Physician Assistant

## 2016-06-21 ENCOUNTER — Encounter: Payer: Self-pay | Admitting: Physician Assistant

## 2016-06-21 VITALS — BP 148/62 | HR 74 | Temp 98.7°F | Resp 16 | Ht 61.0 in | Wt 137.0 lb

## 2016-06-21 DIAGNOSIS — Z1231 Encounter for screening mammogram for malignant neoplasm of breast: Secondary | ICD-10-CM | POA: Diagnosis not present

## 2016-06-21 DIAGNOSIS — Z1239 Encounter for other screening for malignant neoplasm of breast: Secondary | ICD-10-CM

## 2016-06-21 DIAGNOSIS — E0590 Thyrotoxicosis, unspecified without thyrotoxic crisis or storm: Secondary | ICD-10-CM | POA: Diagnosis not present

## 2016-06-21 DIAGNOSIS — E7800 Pure hypercholesterolemia, unspecified: Secondary | ICD-10-CM | POA: Diagnosis not present

## 2016-06-21 DIAGNOSIS — Z0000 Encounter for general adult medical examination without abnormal findings: Secondary | ICD-10-CM | POA: Diagnosis not present

## 2016-06-21 DIAGNOSIS — I10 Essential (primary) hypertension: Secondary | ICD-10-CM | POA: Diagnosis not present

## 2016-06-21 DIAGNOSIS — D649 Anemia, unspecified: Secondary | ICD-10-CM

## 2016-06-21 NOTE — Patient Instructions (Signed)

## 2016-06-21 NOTE — Progress Notes (Signed)
Patient: Misty Page, Female    DOB: [birth year], 76 y.o.   MRN: [id number] Visit Date: 06/21/2016  Today's Provider: Mar Daring, [name]   Chief Complaint  Patient presents with  . Medicare Wellness   Subjective:    Annual wellness visit Misty Page is a 76 y.o. female. She feels well. She reports exercising. She reports she is sleeping well. She reports that she is scheduled to have an MRI this Friday for her shoulder.  She also reports that in case she needs surgery that her cardiologist told her she doesn't need to come back for medical clearance.  AWE: 06/10/15 Pap: 04/19/2011 Negative Colonoscopy: 07/14/15-diverticulosis. No repeat. Mammogram: patient refuses due to breast augmentation surgeries (states mammogram caused rupture of implant many years ago and she will never have another). -----------------------------------------------------------    Review of Systems  Constitutional: Negative.   HENT: Negative.   Eyes: Negative.   Respiratory: Negative.   Cardiovascular: Negative.   Gastrointestinal: Negative.   Endocrine: Negative.   Genitourinary: Negative.   Musculoskeletal: Positive for arthralgias.  Skin: Negative.   Allergic/Immunologic: Negative.   Neurological: Negative.   Hematological: Negative.   Psychiatric/Behavioral: Negative.     Social History   Social History  . Marital status: Married    Spouse name: Konrad Dolores  . Number of children: 1  . Years of education: N/A   Occupational History  . Not on file.   Social History Main Topics  . Smoking status: Former Smoker    Quit date: 05/30/1994  . Smokeless tobacco: Never Used  . Alcohol use No  . Drug use: No  . Sexual activity: No   Other Topics Concern  . Not on file   Social History Narrative  . No narrative on file    Past Medical History:  Diagnosis Date  . Allergy   . GERD (gastroesophageal reflux disease)   . Hyperlipidemia   . Hypertension   . Osteoporosis     . Thyroid disease      Patient Active Problem List   Diagnosis Date Noted  . Allergic rhinitis, seasonal 10/15/2014  . Cardiac murmur 10/15/2014  . Colitis 10/15/2014  . H/O surgical procedure 10/15/2014  . History of colon polyps 10/15/2014  . Hyperthyroidism 10/15/2014  . Cannot sleep 10/15/2014  . Calculus of kidney 10/15/2014  . Herpes zona 10/15/2014  . CA of skin 10/15/2014  . Colitis gravis ([place]) 10/15/2014  . Aortic heart valve narrowing 12/03/2013  . Arteriosclerosis of coronary artery 11/23/2013  . Heart attack 11/23/2013  . Myocardial infarction 11/23/2013  . Peripheral vascular disease ([place]) 11/23/2013  . Diverticulitis of colon 07/21/2009  . Complication of internal prosthetic device 01/27/2009  . Hemorrhoids without complication 11/21/7626  . Peripheral blood vessel disorder ([place]) 01/27/2009  . Current tobacco use 01/27/2009  . Absolute anemia 10/29/2008  . Atherosclerosis of coronary artery 10/29/2008  . Essential (primary) hypertension 10/29/2008  . Acid reflux 10/29/2008  . Hypercholesteremia 10/29/2008  . OP (osteoporosis) 10/29/2008  . Healed myocardial infarct 05/31/1997    Past Surgical History:  Procedure Laterality Date  . ABDOMINAL HYSTERECTOMY  2007  . BREAST ENHANCEMENT SURGERY  2004  . BREAST SURGERY  1984   Fibrocystic Disease  . CAROTID ENDARTERECTOMY Left 06/21/2014   Dr. Delana Meyer  . CAROTID ENDARTERECTOMY Right 08/02/2014   Dr. Delana Meyer  . COLONOSCOPY WITH PROPOFOL N/A 07/14/2015   Procedure: COLONOSCOPY WITH PROPOFOL;  Surgeon: Hulen Luster, MD;  Location: [facility] ENDOSCOPY;  Service: Gastroenterology;  Laterality: N/A;  . CORONARY ANGIOPLASTY WITH STENT PLACEMENT  1999  . FEMORAL ARTERY STENT  08/2003  . HERNIA REPAIR  2008  . RENAL ARTERY STENT  08/2003    Her family history includes Alzheimer's disease in her sister; Arthritis in her sister; Congestive Heart Failure in her mother; Emphysema in her brother; Heart attack in her brother;  Hyperlipidemia in her mother; Kidney failure in her brother; Leukemia in her brother; Pancreatic cancer in her father; Prostate cancer in her brother; Stomach cancer in her brother.      Current Outpatient Prescriptions:  .  amLODipine (NORVASC) 5 MG tablet, , Disp: , Rfl:  .  Ascorbic Acid (VITAMIN C) 1000 MG tablet, Take 1,000 mg by mouth daily. , Disp: , Rfl:  .  aspirin 81 MG tablet, Take 81 mg by mouth daily. ASPIRIN, 81MG (Oral Tablet)  1 Every Day for 0 days  Quantity: 0.00;  Refills: 0   Ordered :13-Apr-2010  Celene Kras, MA, Anastasiya ;  Started 29-October-2008 Active Comments: DX: 414.00, Disp: , Rfl:  .  B COMPLEX VITAMINS PO, Take 1 tablet by mouth daily. B COMPLEX (Oral Capsule)  1 Every Day for 0 days  Quantity: 0.00;  Refills: 0   Ordered :13-Apr-2010  Celene Kras, MA, Anastasiya ;  Started 29-October-2008 Active Comments: DX: 414.00, Disp: , Rfl:  .  Calcium-Vitamin D-Vitamin K 500-100-40 MG-UNT-MCG CHEW, Chew 1 tablet by mouth 2 (two) times daily. VIACTIV, 500-100-40 (Oral Tablet Chewable)  1 tablet twice a day for 0 days  Quantity: 0.00;  Refills: 0   Ordered :13-Apr-2010  Celene Kras, MA, Anastasiya ;  Started 29-October-2008 Active Comments: DX: 733.00, Disp: , Rfl:  .  Coenzyme Q10 (CO Q 10) 100 MG CAPS, Take 1 capsule by mouth daily. , Disp: , Rfl:  .  Cranberry 300 MG tablet, Take 300 mg by mouth daily. , Disp: , Rfl:  .  famotidine (PEPCID) 40 MG tablet, TAKE ONE (1) TABLET EACH DAY, Disp: 90 tablet, Rfl: 1 .  ferrous sulfate 325 (65 FE) MG EC tablet, Take 325 mg by mouth daily. FERROUS SULFATE, 325 (65 Fe)MG (Oral Tablet Delayed Release)  1 every day for 0 days  Quantity: 0.00;  Refills: 0   Ordered :13-Apr-2010  Celene Kras, MA, Anastasiya ;  Started 29-October-2008 Active Comments: DX: 285.9, Disp: , Rfl:  .  fexofenadine (ALLEGRA) 180 MG tablet, Take 180 mg by mouth daily. , Disp: , Rfl:  .  fluticasone (FLONASE) 50 MCG/ACT nasal spray, Place 2 sprays into both nostrils daily., Disp:  48 g, Rfl: 3 .  hydrochlorothiazide (HYDRODIURIL) 25 MG tablet, TAKE ONE (1) TABLET EACH DAY, Disp: 90 tablet, Rfl: 1 .  losartan (COZAAR) 100 MG tablet, TAKE ONE (1) TABLET EACH DAY, Disp: 90 tablet, Rfl: 1 .  Mesalamine (ASACOL) 400 MG CPDR DR capsule, Take 2 capsules (800 mg total) by mouth 2 (two) times daily., Disp: 120 capsule, Rfl: 0 .  montelukast (SINGULAIR) 10 MG tablet, TAKE ONE (1) TABLET AT BEDTIME, Disp: 90 tablet, Rfl: 3 .  MULTIPLE VITAMIN PO, Take 1 tablet by mouth daily. MULTIVITAMINS (Oral Tablet)  1 Every Day for 0 days  Quantity: 0.00;  Refills: 0   Ordered :13-Apr-2010  Celene Kras, MA, Anastasiya ;  Started 29-October-2008 Active Comments: DX: 414.00, Disp: , Rfl:  .  Omega-3 Fatty Acids (FISH OIL) 1000 MG CAPS, Take 1 capsule by mouth daily. FISH OIL CONCENTRATE, 1000MG (Oral Capsule)  1 Every Day for 0 days  Quantity: 0.00;  Refills: 0   Ordered :13-Apr-2010  Celene Kras, MA, Anastasiya ;  Started 29-October-2008 Active Comments: DX: 414.00, Disp: , Rfl:  .  rosuvastatin (CRESTOR) 20 MG tablet, Take 0.5 tablets (10 mg total) by mouth daily., Disp: 90 tablet, Rfl: 3 .  terbinafine (LAMISIL) 250 MG tablet, TAKE ONE (1) TABLET EACH DAY, Disp: 30 tablet, Rfl: 0  Patient Care Team: Mar Daring, [name] as PCP - General (Family Medicine)     Objective:   Vitals: BP (!) 148/62 (BP Location: Left Arm, Patient Position: Sitting, Cuff Size: Normal)   Pulse 74   Temp 98.7 F (37.1 C) (Oral)   Resp 16   Ht 5' 1"  (1.549 m)   Wt 137 lb (62.1 kg)   BMI 25.89 kg/m   Physical Exam  Constitutional: She is oriented to person, place, and time. She appears well-developed and well-nourished. No distress.  HENT:  Head: Normocephalic and atraumatic.  Right Ear: Hearing, tympanic membrane, external ear and ear canal normal.  Left Ear: Hearing, tympanic membrane, external ear and ear canal normal.  Nose: Nose normal.  Mouth/Throat: Uvula is midline, oropharynx is clear and moist and  mucous membranes are normal. She has dentures. No oropharyngeal exudate.  Eyes: Conjunctivae and EOM are normal. Pupils are equal, round, and reactive to light. Right eye exhibits no discharge. Left eye exhibits no discharge. No scleral icterus.  Neck: Normal range of motion. Neck supple. No JVD present. Carotid bruit is present. No tracheal deviation present. No thyromegaly present.    Cardiovascular: Normal rate, regular rhythm and intact distal pulses.  Exam reveals no gallop and no friction rub.   Murmur heard.  Systolic murmur is present with a grade of 3/6  Pulmonary/Chest: Effort normal and breath sounds normal. No respiratory distress. She has no wheezes. She has no rales. She exhibits no tenderness.  Pt refused breast exam  Abdominal: Soft. Bowel sounds are normal. She exhibits no distension and no mass. There is tenderness (lower abdominal; colitis). There is no rebound and no guarding.  Musculoskeletal: Normal range of motion. She exhibits no edema or tenderness.  Lymphadenopathy:    She has no cervical adenopathy.  Neurological: She is alert and oriented to person, place, and time.  Skin: Skin is warm and dry. No rash noted. She is not diaphoretic.  Psychiatric: She has a normal mood and affect. Her behavior is normal. Judgment and thought content normal.  Vitals reviewed.   Activities of Daily Living In your present state of health, do you have any difficulty performing the following activities: 06/21/2016  Hearing? N  Vision? N  Difficulty concentrating or making decisions? N  Walking or climbing stairs? N  Dressing or bathing? N  Doing errands, shopping? N  Some recent data might be hidden    Fall Risk Assessment Fall Risk  06/21/2016 06/10/2015 12/16/2014  Falls in the past year? Yes No No  Number falls in past yr: 1 - -  Injury with Fall? No - -     Depression Screen PHQ 2/9 Scores 06/21/2016 06/10/2015 12/16/2014  PHQ - 2 Score 0 0 0    Cognitive Testing -  6-CIT  Correct? Score   What year is it? yes 0 0 or 4  What month is it? yes 0 0 or 3  Memorize:    Pia Mau,  42,  [place],      What time is it? (within 1 hour) yes 0 0 or 3  Count backwards from 20 yes 0 0, 2, or 4  Name the months of the year yes 0 0, 2, or 4  Repeat name & address above yes 0 0, 2, 4, 6, 8, or 10       TOTAL SCORE  0/28   Interpretation:  Normal  Normal (0-7) Abnormal (8-28)   Audit-C Alcohol Use Screening  Question Answer Points  How often do you have alcoholic drink? never 0  On days you do drink alcohol, how many drinks do you typically consume? 0 0  How oftey will you drink 6 or more in a total? never 0  Total Score:  0   A score of 3 or more in women, and 4 or more in men indicates increased risk for alcohol abuse, EXCEPT if all of the points are from question 1.     Assessment & Plan:     Annual Wellness Visit  Reviewed patient's Family Medical History Reviewed and updated list of patient's medical providers Assessment of cognitive impairment was done Assessed patient's functional ability Established a written schedule for health screening [place] Completed and Reviewed  Exercise Activities and Dietary recommendations Goals    . Exercise 150 minutes per week (moderate activity)       Immunization History  Administered Date(s) Administered  . Influenza, High Dose Seasonal PF 03/12/2016  . Influenza-Unspecified 03/01/2015  . Pneumococcal Conjugate-13 05/18/2013  . Pneumococcal Polysaccharide-23 03/02/2004  . Td 02/08/2005  . Zoster 01/29/2006    Health Maintenance  Topic Date Due  . PNA vac Low Risk Adult (2 of 2 - PPSV23) 05/18/2014  . TETANUS/TDAP  02/09/2015  . COLONOSCOPY  07/13/2025  . INFLUENZA VACCINE  Completed  . DEXA SCAN  Completed  . ZOSTAVAX  Completed     Discussed health benefits of physical activity, and encouraged her to engage in regular exercise appropriate for her age  and condition.   1. Medicare annual wellness visit, subsequent Normal exam today.  2. Breast cancer screening Patient refuses mammograms and refused breast exam today. She does perform self breast exams.  3. Essential (primary) hypertension Stable. Continue current medical treatment plan of amlodipine 65m, losartan 1066m HCTZ 2528mWill check labs as below and f/u pending results. - CBC w/Diff/Platelet - Comprehensive Metabolic Panel (CMET)  4. Hyperthyroidism Previous levels were low but stable. No symptoms. Will check labs as below and f/u pending results. - Thyroid Panel With TSH  5. Anemia, unspecified type H/O this. Will check labs as below and f/u pending results. - CBC w/Diff/Platelet  6. Hypercholesteremia Stable. Continue current medical treatment plan with crestor 42m42mill check labs as below and f/u pending results. - Comprehensive Metabolic Panel (CMET) - Lipid Profile   ------------------------------------------------------------------------------------------------------------    JennMar Daring-C  [facility]

## 2016-06-25 ENCOUNTER — Ambulatory Visit
Admission: RE | Admit: 2016-06-25 | Discharge: 2016-06-25 | Disposition: A | Discharge status: Home / Self Care | Payer: PPO | Source: Ambulatory Visit | Attending: Surgery | Admitting: Surgery

## 2016-06-25 DIAGNOSIS — M7521 Bicipital tendinitis, right shoulder: Secondary | ICD-10-CM | POA: Diagnosis not present

## 2016-06-25 DIAGNOSIS — M25511 Pain in right shoulder: Secondary | ICD-10-CM | POA: Diagnosis not present

## 2016-06-25 DIAGNOSIS — M75121 Complete rotator cuff tear or rupture of right shoulder, not specified as traumatic: Secondary | ICD-10-CM | POA: Diagnosis not present

## 2016-06-25 DIAGNOSIS — M19011 Primary osteoarthritis, right shoulder: Secondary | ICD-10-CM | POA: Insufficient documentation

## 2016-06-25 DIAGNOSIS — M7581 Other shoulder lesions, right shoulder: Secondary | ICD-10-CM

## 2016-06-25 DIAGNOSIS — M7541 Impingement syndrome of right shoulder: Secondary | ICD-10-CM

## 2016-06-25 DIAGNOSIS — M7580 Other shoulder lesions, unspecified shoulder: Secondary | ICD-10-CM | POA: Insufficient documentation

## 2016-06-28 DIAGNOSIS — D649 Anemia, unspecified: Secondary | ICD-10-CM | POA: Diagnosis not present

## 2016-06-28 DIAGNOSIS — E7800 Pure hypercholesterolemia, unspecified: Secondary | ICD-10-CM | POA: Diagnosis not present

## 2016-06-28 DIAGNOSIS — I10 Essential (primary) hypertension: Secondary | ICD-10-CM | POA: Diagnosis not present

## 2016-06-28 DIAGNOSIS — E0590 Thyrotoxicosis, unspecified without thyrotoxic crisis or storm: Secondary | ICD-10-CM | POA: Diagnosis not present

## 2016-06-29 LAB — LIPID PANEL
CHOL/HDL RATIO: 2.7 ratio (ref 0.0–4.4)
Cholesterol, Total: 209 mg/dL — ABNORMAL HIGH (ref 100–199)
HDL: 78 mg/dL (ref >39)
LDL Calculated: 113 mg/dL — ABNORMAL HIGH (ref 0–99)
Triglycerides: 88 mg/dL (ref 0–149)
VLDL Cholesterol Cal: 18 mg/dL (ref 5–40)

## 2016-06-29 LAB — CBC WITH DIFFERENTIAL/PLATELET
Basophils Absolute: 0 10*3/uL (ref 0.0–0.2)
Basos: 0 %
EOS (ABSOLUTE): 0.2 10*3/uL (ref 0.0–0.4)
EOS: 4 %
Hematocrit: 42 % (ref 34.0–46.6)
Hemoglobin: 14.5 g/dL (ref 11.1–15.9)
Immature Grans (Abs): 0 10*3/uL (ref 0.0–0.1)
Immature Granulocytes: 0 %
LYMPHS ABS: 1 10*3/uL (ref 0.7–3.1)
Lymphs: 17 %
MCH: 32.4 pg (ref 26.6–33.0)
MCHC: 34.5 g/dL (ref 31.5–35.7)
MCV: 94 fL (ref 79–97)
MONOS ABS: 0.5 10*3/uL (ref 0.1–0.9)
Monocytes: 9 %
Neutrophils Absolute: 4.1 10*3/uL (ref 1.4–7.0)
Neutrophils: 70 %
Platelets: 355 10*3/uL (ref 150–379)
RBC: 4.48 x10E6/uL (ref 3.77–5.28)
RDW: 13.3 % (ref 12.3–15.4)
WBC: 5.9 10*3/uL (ref 3.4–10.8)

## 2016-06-29 LAB — COMPREHENSIVE METABOLIC PANEL
A/G RATIO: 1.9 (ref 1.2–2.2)
ALK PHOS: 65 IU/L (ref 39–117)
ALT: 16 IU/L (ref 0–32)
AST: 17 IU/L (ref 0–40)
Albumin: 4.4 g/dL (ref 3.5–4.8)
BUN/Creatinine Ratio: 25 (ref 12–28)
BUN: 16 mg/dL (ref 8–27)
Bilirubin Total: 0.3 mg/dL (ref 0.0–1.2)
CHLORIDE: 99 mmol/L (ref 96–106)
CO2: 24 mmol/L (ref 18–29)
CREATININE: 0.65 mg/dL (ref 0.57–1.00)
Calcium: 9.5 mg/dL (ref 8.7–10.3)
GFR calc Af Amer: 100 mL/min/{1.73_m2} (ref >59)
GFR calc non Af Amer: 87 mL/min/{1.73_m2} (ref >59)
GLOBULIN, TOTAL: 2.3 g/dL (ref 1.5–4.5)
Glucose: 87 mg/dL (ref 65–99)
POTASSIUM: 4.1 mmol/L (ref 3.5–5.2)
SODIUM: 140 mmol/L (ref 134–144)
Total Protein: 6.7 g/dL (ref 6.0–8.5)

## 2016-06-29 LAB — THYROID PANEL WITH TSH
Free Thyroxine Index: 2 (ref 1.2–4.9)
T3 Uptake Ratio: 26 % (ref 24–39)
T4 TOTAL: 7.5 ug/dL (ref 4.5–12.0)
TSH: 0.085 u[IU]/mL — AB (ref 0.450–4.500)

## 2016-07-05 DIAGNOSIS — M75111 Incomplete rotator cuff tear or rupture of right shoulder, not specified as traumatic: Secondary | ICD-10-CM | POA: Diagnosis not present

## 2016-07-05 DIAGNOSIS — M75121 Complete rotator cuff tear or rupture of right shoulder, not specified as traumatic: Secondary | ICD-10-CM | POA: Diagnosis not present

## 2016-07-05 DIAGNOSIS — M7581 Other shoulder lesions, right shoulder: Secondary | ICD-10-CM | POA: Diagnosis not present

## 2016-07-05 DIAGNOSIS — M7541 Impingement syndrome of right shoulder: Secondary | ICD-10-CM | POA: Diagnosis not present

## 2016-08-06 ENCOUNTER — Telehealth: Payer: Self-pay | Admitting: Emergency Medicine

## 2016-08-06 DIAGNOSIS — F419 Anxiety disorder, unspecified: Secondary | ICD-10-CM

## 2016-08-06 MED ORDER — ALPRAZOLAM 0.25 MG PO TABS: 0.2500 mg | ORAL_TABLET | Freq: Two times a day (BID) | ORAL | 0 refills | Status: DC | PRN

## 2016-08-06 NOTE — Telephone Encounter (Signed)
Called into [facility]

## 2016-08-06 NOTE — Telephone Encounter (Signed)
Please call in Alprazolam 0.37m tab Take 1 tab PO BID prn anxiety #20 NR to [facility].

## 2016-08-06 NOTE — Telephone Encounter (Signed)
Pt called wanting to see you today, but you are booked. She scheduled and appt with you for Monday morning. She was upset and could not tell me what she wanted to be seen for so she out her daughter on the phone. Pt husband has just been diagnose with stage 4 pancreatic cancer and they are waiting on hospice to come into the home. They are wanting to know if you can call her in something that can help her through this over the weekend until she can see you to discuss this more with you. She uses Misty Page please advise. Thanks

## 2016-08-09 ENCOUNTER — Ambulatory Visit (INDEPENDENT_AMBULATORY_CARE_PROVIDER_SITE_OTHER): Payer: PPO | Admitting: Physician Assistant

## 2016-08-09 ENCOUNTER — Encounter: Payer: Self-pay | Admitting: Physician Assistant

## 2016-08-09 ENCOUNTER — Other Ambulatory Visit: Payer: Self-pay | Admitting: Physician Assistant

## 2016-08-09 VITALS — BP 110/70 | HR 90 | Temp 98.2°F | Resp 16 | Wt 135.2 lb

## 2016-08-09 DIAGNOSIS — N3001 Acute cystitis with hematuria: Secondary | ICD-10-CM | POA: Diagnosis not present

## 2016-08-09 DIAGNOSIS — R300 Dysuria: Secondary | ICD-10-CM

## 2016-08-09 DIAGNOSIS — B351 Tinea unguium: Secondary | ICD-10-CM

## 2016-08-09 DIAGNOSIS — F419 Anxiety disorder, unspecified: Secondary | ICD-10-CM | POA: Diagnosis not present

## 2016-08-09 LAB — POCT URINALYSIS DIPSTICK
Bilirubin, UA: NEGATIVE
GLUCOSE UA: NEGATIVE
KETONES UA: NEGATIVE
Nitrite, UA: NEGATIVE
Protein, UA: NEGATIVE
SPEC GRAV UA: 1.02
Urobilinogen, UA: 0.2
pH, UA: 5

## 2016-08-09 MED ORDER — SULFAMETHOXAZOLE-TRIMETHOPRIM 800-160 MG PO TABS: 1.0000 | ORAL_TABLET | Freq: Two times a day (BID) | ORAL | 0 refills | Status: DC

## 2016-08-09 MED ORDER — ALPRAZOLAM 0.25 MG PO TABS: 0.2500 mg | ORAL_TABLET | Freq: Two times a day (BID) | ORAL | 5 refills | Status: DC | PRN

## 2016-08-09 NOTE — Progress Notes (Signed)
Patient: Misty Page Female    DOB: June 05, 1940   76 y.o.   MRN: [id number] Visit Date: 08/09/2016  Today's Provider: Mar Daring, [name]   Chief Complaint  Patient presents with  . Anxiety   Subjective:    Anxiety  Presents for initial visit. Onset was 1 to 6 months ago. The problem has been gradually worsening. Symptoms include excessive worry, insomnia (Alprazolam helps her sleep), nervous/anxious behavior and panic. Patient reports no chest pain, decreased concentration, depressed mood, dizziness, feeling of choking, muscle tension, nausea, palpitations, restlessness, shortness of breath or suicidal ideas. Hyperventilation: when she got the panic attack on Friday. Primary symptoms comment: A lot of Stress and crying. Symptoms occur constantly. The severity of symptoms is moderate. The symptoms are aggravated by family issues and work stress (Husband is ill). The quality of sleep is good. Nighttime awakenings: one to two.   Past treatments include benzodiazephines (Alprazolam 0.25). Improvement on treatment: Just started taking the medication on Friday. Compliance with prior treatments has been good.    GAD 7 : Generalized Anxiety Score 08/09/2016  Nervous, Anxious, on Edge 3  Control/stop worrying 3  Worry too much - different things 3  Trouble relaxing 3  Restless 0  Easily annoyed or irritable 2  Afraid - awful might happen 3  Total GAD 7 Score 17  Anxiety Difficulty Not difficult at all   Her husband was recently diagnosed with stage 4 pancreatic cancer. They have been unable to do any palliative treatment or further testing to check for metastases due to his blood sugar being out of control. She reports they are meeting with the hospice nurse today.      Allergies  Allergen Reactions  . Codeine   . Levofloxacin   . Naproxen   . Penicillins     Rash  . Shellfish Allergy      Current Outpatient Prescriptions:  .  ALPRAZolam (XANAX) 0.25 MG tablet, Take  1 tablet (0.25 mg total) by mouth 2 (two) times daily as needed for anxiety., Disp: 20 tablet, Rfl: 0 .  amLODipine (NORVASC) 5 MG tablet, , Disp: , Rfl:  .  Ascorbic Acid (VITAMIN C) 1000 MG tablet, Take 1,000 mg by mouth daily. , Disp: , Rfl:  .  aspirin 81 MG tablet, Take 81 mg by mouth daily. ASPIRIN, 81MG (Oral Tablet)  1 Every Day for 0 days  Quantity: 0.00;  Refills: 0   Ordered :13-Apr-2010  Celene Kras, MA, Anastasiya ;  Started 29-October-2008 Active Comments: DX: 414.00, Disp: , Rfl:  .  B COMPLEX VITAMINS PO, Take 1 tablet by mouth daily. B COMPLEX (Oral Capsule)  1 Every Day for 0 days  Quantity: 0.00;  Refills: 0   Ordered :13-Apr-2010  Celene Kras, MA, Anastasiya ;  Started 29-October-2008 Active Comments: DX: 414.00, Disp: , Rfl:  .  Calcium-Vitamin D-Vitamin K 500-100-40 MG-UNT-MCG CHEW, Chew 1 tablet by mouth 2 (two) times daily. VIACTIV, 500-100-40 (Oral Tablet Chewable)  1 tablet twice a day for 0 days  Quantity: 0.00;  Refills: 0   Ordered :13-Apr-2010  Celene Kras, MA, Anastasiya ;  Started 29-October-2008 Active Comments: DX: 733.00, Disp: , Rfl:  .  Coenzyme Q10 (CO Q 10) 100 MG CAPS, Take 1 capsule by mouth daily. , Disp: , Rfl:  .  Cranberry 300 MG tablet, Take 300 mg by mouth daily. , Disp: , Rfl:  .  famotidine (PEPCID) 40 MG tablet, TAKE ONE (1) TABLET EACH DAY, Disp:  90 tablet, Rfl: 1 .  ferrous sulfate 325 (65 FE) MG EC tablet, Take 325 mg by mouth daily. FERROUS SULFATE, 325 (65 Fe)MG (Oral Tablet Delayed Release)  1 every day for 0 days  Quantity: 0.00;  Refills: 0   Ordered :13-Apr-2010  Celene Kras, MA, Anastasiya ;  Started 29-October-2008 Active Comments: DX: 285.9, Disp: , Rfl:  .  fexofenadine (ALLEGRA) 180 MG tablet, Take 180 mg by mouth daily. , Disp: , Rfl:  .  fluticasone (FLONASE) 50 MCG/ACT nasal spray, Place 2 sprays into both nostrils daily., Disp: 48 g, Rfl: 3 .  hydrochlorothiazide (HYDRODIURIL) 25 MG tablet, TAKE ONE (1) TABLET EACH DAY, Disp: 90 tablet, Rfl: 1 .   losartan (COZAAR) 100 MG tablet, TAKE ONE (1) TABLET EACH DAY, Disp: 90 tablet, Rfl: 1 .  Mesalamine (ASACOL) 400 MG CPDR DR capsule, Take 2 capsules (800 mg total) by mouth 2 (two) times daily., Disp: 120 capsule, Rfl: 0 .  montelukast (SINGULAIR) 10 MG tablet, TAKE ONE (1) TABLET AT BEDTIME, Disp: 90 tablet, Rfl: 3 .  MULTIPLE VITAMIN PO, Take 1 tablet by mouth daily. MULTIVITAMINS (Oral Tablet)  1 Every Day for 0 days  Quantity: 0.00;  Refills: 0   Ordered :13-Apr-2010  Celene Kras, MA, Anastasiya ;  Started 29-October-2008 Active Comments: DX: 414.00, Disp: , Rfl:  .  Omega-3 Fatty Acids (FISH OIL) 1000 MG CAPS, Take 1 capsule by mouth daily. FISH OIL CONCENTRATE, 1000MG (Oral Capsule)  1 Every Day for 0 days  Quantity: 0.00;  Refills: 0   Ordered :13-Apr-2010  Celene Kras, MA, Anastasiya ;  Started 29-October-2008 Active Comments: DX: 414.00, Disp: , Rfl:  .  rosuvastatin (CRESTOR) 20 MG tablet, Take 0.5 tablets (10 mg total) by mouth daily., Disp: 90 tablet, Rfl: 3 .  terbinafine (LAMISIL) 250 MG tablet, TAKE ONE (1) TABLET EACH DAY, Disp: 30 tablet, Rfl: 0  Review of Systems  Respiratory: Negative for shortness of breath.   Cardiovascular: Negative for chest pain and palpitations.  Gastrointestinal: Negative for nausea.  Neurological: Negative for dizziness.  Psychiatric/Behavioral: Negative for decreased concentration and suicidal ideas. The patient is nervous/anxious and has insomnia (Alprazolam helps her sleep).     Social History  Substance Use Topics  . Smoking status: Former Smoker    Quit date: 05/30/1994  . Smokeless tobacco: Never Used  . Alcohol use No   Objective:   BP 110/70 (BP Location: Right Arm, Patient Position: Sitting, Cuff Size: Normal)   Pulse 90   Temp 98.2 F (36.8 C) (Oral)   Resp 16   Wt 135 lb 3.2 oz (61.3 kg)   BMI 25.55 kg/m    Physical Exam  Constitutional: She appears well-developed and well-nourished. No distress.  Neck: Normal range of motion. Neck  supple.  Cardiovascular: Normal rate, regular rhythm and normal heart sounds.  Exam reveals no gallop and no friction rub.   No murmur heard. Pulmonary/Chest: Effort normal and breath sounds normal. No respiratory distress. She has no wheezes. She has no rales.  Abdominal: Soft. Bowel sounds are normal. She exhibits no distension and no mass. There is no tenderness. There is no rebound, no guarding and no CVA tenderness.  Skin: She is not diaphoretic.  Psychiatric: Her speech is normal and behavior is normal. Judgment and thought content normal. Her mood appears anxious. Cognition and memory are normal. She exhibits a depressed mood.  Vitals reviewed.     Assessment & Plan:     1. Anxiety Situational with recent diagnosis  of her husband's pancreatic cancer. The alprazolam has been helping her thus far. She has only been taking when she feels "anxious" or to help her sleep. She does not wish to start any other medication at this time so as not to have side effects or altering her thoughts during the planning to come. She is to call the office if symptoms worsen or if she needs anything during this difficult time for her family.  - ALPRAZolam (XANAX) 0.25 MG tablet; Take 1 tablet (0.25 mg total) by mouth 2 (two) times daily as needed for anxiety.  Dispense: 30 tablet; Refill: 5  2. Dysuria UA positive for pyuria and hematuria. Advised to push fluids. Cranberry juice can help bladder spasms. Bactrim given as below empirically. Will adjust antibiotic therapy as needed pending C&S results. . She is to call if symptoms persist.  - POCT Urinalysis Dipstick - Urine culture  3. Acute cystitis with hematuria See above medical treatment plan. - sulfamethoxazole-trimethoprim (BACTRIM DS,SEPTRA DS) 800-160 MG tablet; Take 1 tablet by mouth 2 (two) times daily.  Dispense: 6 tablet; Refill: 0       Mar Daring, [name]  [facility]

## 2016-08-09 NOTE — Patient Instructions (Addendum)

## 2016-08-13 ENCOUNTER — Telehealth: Payer: Self-pay

## 2016-08-13 LAB — URINE CULTURE

## 2016-08-13 NOTE — Telephone Encounter (Signed)
Patient advised as directed below. Per patient she is done with the Bactrim and is not having any symptoms.  Thanks,  -Kaidynce Pfister

## 2016-08-13 NOTE — Telephone Encounter (Signed)
-----   Message from Mar Daring, [name] sent at 08/13/2016 10:10 AM EDT ----- Urine culture was positive but should be susceptible to bactrim. Take until completed and call if no improvements.

## 2016-08-24 ENCOUNTER — Other Ambulatory Visit: Payer: Self-pay

## 2016-08-24 DIAGNOSIS — E7800 Pure hypercholesterolemia, unspecified: Secondary | ICD-10-CM

## 2016-08-24 MED ORDER — ROSUVASTATIN CALCIUM 20 MG PO TABS: 10.0000 mg | ORAL_TABLET | Freq: Every day | ORAL | 3 refills | Status: DC

## 2016-08-24 NOTE — Telephone Encounter (Signed)
Refill request from [facility] for [employer]

## 2016-09-20 ENCOUNTER — Other Ambulatory Visit: Payer: Self-pay | Admitting: Physician Assistant

## 2016-09-20 DIAGNOSIS — E7800 Pure hypercholesterolemia, unspecified: Secondary | ICD-10-CM

## 2016-09-20 MED ORDER — ROSUVASTATIN CALCIUM 20 MG PO TABS: 10.0000 mg | ORAL_TABLET | Freq: Every day | ORAL | 3 refills | Status: DC

## 2016-09-20 NOTE — Telephone Encounter (Signed)
[facility] faxed a request on the following medication.  Thanks CC  rosuvastatin (CRESTOR) 20 MG tablet Take One (1) tablet each day.

## 2016-09-20 NOTE — Addendum Note (Signed)
Addended by: Doristine Devoid on: 09/20/2016 04:37 PM   Modules accepted: Orders

## 2016-09-23 ENCOUNTER — Ambulatory Visit (INDEPENDENT_AMBULATORY_CARE_PROVIDER_SITE_OTHER): Payer: PPO | Admitting: Physician Assistant

## 2016-09-23 VITALS — BP 142/70 | HR 60 | Temp 98.6°F | Resp 16 | Wt 134.0 lb

## 2016-09-23 DIAGNOSIS — N3001 Acute cystitis with hematuria: Secondary | ICD-10-CM

## 2016-09-23 LAB — POCT URINALYSIS DIPSTICK
Bilirubin, UA: NEGATIVE
Glucose, UA: NEGATIVE
KETONES UA: NEGATIVE
Nitrite, UA: NEGATIVE
PROTEIN UA: NEGATIVE
SPEC GRAV UA: 1.025 (ref 1.010–1.025)
UROBILINOGEN UA: 0.2 U/dL
pH, UA: 6 (ref 5.0–8.0)

## 2016-09-23 MED ORDER — SULFAMETHOXAZOLE-TRIMETHOPRIM 800-160 MG PO TABS: 1.0000 | ORAL_TABLET | Freq: Two times a day (BID) | ORAL | 0 refills | Status: DC

## 2016-09-23 NOTE — Progress Notes (Signed)
Patient: Misty Page Female    DOB: 1940-11-06   76 y.o.   MRN: [id number] Visit Date: 09/23/2016  Today's Provider: Mar Daring, [name]   Chief Complaint  Patient presents with  . Urinary Tract Infection   Subjective:    Urinary Tract Infection   This is a new problem. The current episode started in the past 7 days (about 4 days). The problem has been gradually worsening. The quality of the pain is described as aching. The pain is mild. There has been no fever. Associated symptoms include frequency. Pertinent negatives include no chills, flank pain, hematuria or urgency. Associated symptoms comments: Has a pressure sensation.. She has tried increased fluids and sitz baths (and cranberry tablets) for the symptoms.   I did last see the patient on 08/09/16. Her husband had been recently diagnosed with pancreatic cancer. She reports that he passed just 2 weeks after, which was 4 weeks ago to the day. She feels she is doing ok and knows that he isn't suffering any longer.     Allergies  Allergen Reactions  . Codeine   . Levofloxacin   . Naproxen   . Penicillins     Rash  . Shellfish Allergy      Current Outpatient Prescriptions:  .  ALPRAZolam (XANAX) 0.25 MG tablet, Take 1 tablet (0.25 mg total) by mouth 2 (two) times daily as needed for anxiety., Disp: 30 tablet, Rfl: 5 .  Ascorbic Acid (VITAMIN C) 1000 MG tablet, Take 1,000 mg by mouth daily. , Disp: , Rfl:  .  aspirin 81 MG tablet, Take 81 mg by mouth daily. ASPIRIN, 81MG (Oral Tablet)  1 Every Day for 0 days  Quantity: 0.00;  Refills: 0   Ordered :13-Apr-2010  Celene Kras, MA, Anastasiya ;  Started 29-October-2008 Active Comments: DX: 414.00, Disp: , Rfl:  .  B COMPLEX VITAMINS PO, Take 1 tablet by mouth daily. B COMPLEX (Oral Capsule)  1 Every Day for 0 days  Quantity: 0.00;  Refills: 0   Ordered :13-Apr-2010  Celene Kras, MA, Anastasiya ;  Started 29-October-2008 Active Comments: DX: 414.00, Disp: , Rfl:  .  Calcium-Vitamin  D-Vitamin K 500-100-40 MG-UNT-MCG CHEW, Chew 1 tablet by mouth 2 (two) times daily. VIACTIV, 500-100-40 (Oral Tablet Chewable)  1 tablet twice a day for 0 days  Quantity: 0.00;  Refills: 0   Ordered :13-Apr-2010  Celene Kras, MA, Anastasiya ;  Started 29-October-2008 Active Comments: DX: 733.00, Disp: , Rfl:  .  Coenzyme Q10 (CO Q 10) 100 MG CAPS, Take 1 capsule by mouth daily. , Disp: , Rfl:  .  Cranberry 300 MG tablet, Take 300 mg by mouth daily. , Disp: , Rfl:  .  famotidine (PEPCID) 40 MG tablet, TAKE ONE (1) TABLET EACH DAY, Disp: 90 tablet, Rfl: 1 .  ferrous sulfate 325 (65 FE) MG EC tablet, Take 325 mg by mouth daily. FERROUS SULFATE, 325 (65 Fe)MG (Oral Tablet Delayed Release)  1 every day for 0 days  Quantity: 0.00;  Refills: 0   Ordered :13-Apr-2010  Celene Kras, MA, Anastasiya ;  Started 29-October-2008 Active Comments: DX: 285.9, Disp: , Rfl:  .  fexofenadine (ALLEGRA) 180 MG tablet, Take 180 mg by mouth daily. , Disp: , Rfl:  .  fluticasone (FLONASE) 50 MCG/ACT nasal spray, Place 2 sprays into both nostrils daily., Disp: 48 g, Rfl: 3 .  hydrochlorothiazide (HYDRODIURIL) 25 MG tablet, TAKE ONE (1) TABLET EACH DAY, Disp: 90 tablet, Rfl: 1 .  losartan (COZAAR)  100 MG tablet, TAKE ONE (1) TABLET EACH DAY, Disp: 90 tablet, Rfl: 1 .  Mesalamine (ASACOL) 400 MG CPDR DR capsule, Take 2 capsules (800 mg total) by mouth 2 (two) times daily., Disp: 120 capsule, Rfl: 0 .  montelukast (SINGULAIR) 10 MG tablet, TAKE ONE (1) TABLET AT BEDTIME, Disp: 90 tablet, Rfl: 3 .  MULTIPLE VITAMIN PO, Take 1 tablet by mouth daily. MULTIVITAMINS (Oral Tablet)  1 Every Day for 0 days  Quantity: 0.00;  Refills: 0   Ordered :13-Apr-2010  Celene Kras, MA, Anastasiya ;  Started 29-October-2008 Active Comments: DX: 414.00, Disp: , Rfl:  .  Omega-3 Fatty Acids (FISH OIL) 1000 MG CAPS, Take 1 capsule by mouth daily. FISH OIL CONCENTRATE, 1000MG (Oral Capsule)  1 Every Day for 0 days  Quantity: 0.00;  Refills: 0   Ordered :13-Apr-2010   Celene Kras, MA, Anastasiya ;  Started 29-October-2008 Active Comments: DX: 414.00, Disp: , Rfl:  .  rosuvastatin (CRESTOR) 20 MG tablet, Take 0.5 tablets (10 mg total) by mouth daily., Disp: 45 tablet, Rfl: 3 .  sulfamethoxazole-trimethoprim (BACTRIM DS,SEPTRA DS) 800-160 MG tablet, Take 1 tablet by mouth 2 (two) times daily., Disp: 6 tablet, Rfl: 0 .  terbinafine (LAMISIL) 250 MG tablet, TAKE ONE (1) TABLET EACH DAY, Disp: 30 tablet, Rfl: 3 .  amLODipine (NORVASC) 5 MG tablet, , Disp: , Rfl:   Review of Systems  Constitutional: Negative for activity change, appetite change, chills, diaphoresis, fatigue, fever and unexpected weight change.  Respiratory: Negative.   Cardiovascular: Negative for leg swelling.  Gastrointestinal: Positive for abdominal pain and diarrhea (does have colitis). Negative for abdominal distention.  Genitourinary: Positive for dysuria and frequency. Negative for flank pain, hematuria, pelvic pain, urgency, vaginal bleeding, vaginal discharge and vaginal pain.  Musculoskeletal: Negative for back pain.  Neurological: Negative for dizziness and headaches.    Social History  Substance Use Topics  . Smoking status: Former Smoker    Quit date: 05/30/1994  . Smokeless tobacco: Never Used  . Alcohol use No   Objective:   BP (!) 142/70 (BP Location: Left Arm, Patient Position: Sitting, Cuff Size: Normal)   Pulse 60   Temp 98.6 F (37 C)   Resp 16   Wt 134 lb (60.8 kg)   BMI 25.32 kg/m  Vitals:   09/23/16 1556  BP: (!) 142/70  Pulse: 60  Resp: 16  Temp: 98.6 F (37 C)  Weight: 134 lb (60.8 kg)    Physical Exam  Constitutional: She is oriented to person, place, and time. She appears well-developed and well-nourished. No distress.  Cardiovascular: Normal rate, regular rhythm and normal heart sounds.  Exam reveals no gallop and no friction rub.   No murmur heard. Pulmonary/Chest: Effort normal and breath sounds normal. No respiratory distress. She has no wheezes.  She has no rales.  Abdominal: Soft. Normal appearance and bowel sounds are normal. She exhibits no distension and no mass. There is no hepatosplenomegaly. There is tenderness in the suprapubic area and left lower quadrant. There is no rebound, no guarding and no CVA tenderness.  Suprapubic pressure  Neurological: She is alert and oriented to person, place, and time.  Skin: Skin is warm and dry. She is not diaphoretic.  Vitals reviewed.     Assessment & Plan:     1. Acute cystitis with hematuria Worsening symptoms. UA positive. Will treat empirically with Bactrim as below. Continue to push fluids. Urine sent for culture. Will follow up pending C&S results. She is to  call if symptoms do not improve or if they worsen.  - Urine culture - POCT urinalysis dipstick - sulfamethoxazole-trimethoprim (BACTRIM DS,SEPTRA DS) 800-160 MG tablet; Take 1 tablet by mouth 2 (two) times daily.  Dispense: 20 tablet; Refill: 0       Mar Daring, [name]  [facility]

## 2016-09-23 NOTE — Patient Instructions (Signed)

## 2016-09-25 LAB — URINE CULTURE

## 2016-09-27 ENCOUNTER — Telehealth: Payer: Self-pay

## 2016-09-27 NOTE — Telephone Encounter (Signed)
Advised pt of lab results. Pt verbally acknowledges understanding. Emily Drozdowski, CMA   

## 2016-09-27 NOTE — Telephone Encounter (Signed)
-----   Message from Mar Daring, [place] sent at 09/27/2016  8:43 AM EDT ----- Culture was positive for same bacteria as discussed. Still sensitive to antibiotic given. Continue until completed. Call if still no improvement in symptoms.

## 2016-10-01 DIAGNOSIS — M7541 Impingement syndrome of right shoulder: Secondary | ICD-10-CM | POA: Diagnosis not present

## 2016-10-01 DIAGNOSIS — M7581 Other shoulder lesions, right shoulder: Secondary | ICD-10-CM | POA: Diagnosis not present

## 2016-10-01 DIAGNOSIS — M75111 Incomplete rotator cuff tear or rupture of right shoulder, not specified as traumatic: Secondary | ICD-10-CM | POA: Diagnosis not present

## 2016-12-13 ENCOUNTER — Other Ambulatory Visit: Payer: Self-pay | Admitting: Physician Assistant

## 2016-12-13 DIAGNOSIS — I10 Essential (primary) hypertension: Secondary | ICD-10-CM

## 2016-12-16 ENCOUNTER — Ambulatory Visit (INDEPENDENT_AMBULATORY_CARE_PROVIDER_SITE_OTHER): Payer: Self-pay | Admitting: Vascular Surgery

## 2016-12-16 ENCOUNTER — Encounter (INDEPENDENT_AMBULATORY_CARE_PROVIDER_SITE_OTHER): Payer: Self-pay

## 2016-12-20 ENCOUNTER — Ambulatory Visit (INDEPENDENT_AMBULATORY_CARE_PROVIDER_SITE_OTHER): Payer: PPO | Admitting: Physician Assistant

## 2016-12-20 ENCOUNTER — Encounter: Payer: Self-pay | Admitting: Physician Assistant

## 2016-12-20 VITALS — BP 132/80 | HR 77 | Temp 98.6°F | Resp 16 | Wt 131.6 lb

## 2016-12-20 DIAGNOSIS — E0590 Thyrotoxicosis, unspecified without thyrotoxic crisis or storm: Secondary | ICD-10-CM | POA: Diagnosis not present

## 2016-12-20 DIAGNOSIS — E7800 Pure hypercholesterolemia, unspecified: Secondary | ICD-10-CM | POA: Diagnosis not present

## 2016-12-20 NOTE — Progress Notes (Signed)
Patient: Misty Page Female    DOB: April 06, 1941   76 y.o.   MRN: [id number] Visit Date: 12/20/2016  Today's Provider: Mar Daring, [name]   Chief Complaint  Patient presents with  . Follow-up    thyroid   Subjective:    HPI  Hypothyroid, follow-up:  TSH  Date Value Ref Range Status  06/28/2016 0.085 (L) 0.450 - 4.500 uIU/mL Final  11/18/2015 0.514 0.450 - 4.500 uIU/mL Final  08/04/2015 0.370 (L) 0.450 - 4.500 uIU/mL Final   Wt Readings from Last 3 Encounters:  12/20/16 131 lb 9.6 oz (59.7 kg)  09/23/16 134 lb (60.8 kg)  08/09/16 135 lb 3.2 oz (61.3 kg)    She was last seen for hypothyroid 6 months ago.  Management since that visit includes none. She reports excellent compliance with treatment. She is not having side effects.  She is not exercising. She is experiencing lower abdominal pain She denies change in energy level, diarrhea, heat / cold intolerance, nervousness, palpitations and weight changes Weight trend: decreasing steadily  She is also requesting to have her cholesterol rechecked as well since she cut her cholesterol medication in half.  ------------------------------------------------------------------------     Allergies  Allergen Reactions  . Codeine   . Levofloxacin   . Naproxen   . Penicillins     Rash  . Shellfish Allergy      Current Outpatient Prescriptions:  .  ALPRAZolam (XANAX) 0.25 MG tablet, Take 1 tablet (0.25 mg total) by mouth 2 (two) times daily as needed for anxiety., Disp: 30 tablet, Rfl: 5 .  Ascorbic Acid (VITAMIN C) 1000 MG tablet, Take 1,000 mg by mouth daily. , Disp: , Rfl:  .  aspirin 81 MG tablet, Take 81 mg by mouth daily. ASPIRIN, 81MG (Oral Tablet)  1 Every Day for 0 days  Quantity: 0.00;  Refills: 0   Ordered :13-Apr-2010  Celene Kras, MA, Anastasiya ;  Started 29-October-2008 Active Comments: DX: 414.00, Disp: , Rfl:  .  B COMPLEX VITAMINS PO, Take 1 tablet by mouth daily. B COMPLEX (Oral Capsule)  1  Every Day for 0 days  Quantity: 0.00;  Refills: 0   Ordered :13-Apr-2010  Celene Kras, MA, Anastasiya ;  Started 29-October-2008 Active Comments: DX: 414.00, Disp: , Rfl:  .  Calcium-Vitamin D-Vitamin K 500-100-40 MG-UNT-MCG CHEW, Chew 1 tablet by mouth 2 (two) times daily. VIACTIV, 500-100-40 (Oral Tablet Chewable)  1 tablet twice a day for 0 days  Quantity: 0.00;  Refills: 0   Ordered :13-Apr-2010  Celene Kras, MA, Anastasiya ;  Started 29-October-2008 Active Comments: DX: 733.00, Disp: , Rfl:  .  Coenzyme Q10 (CO Q 10) 100 MG CAPS, Take 1 capsule by mouth daily. , Disp: , Rfl:  .  Cranberry 300 MG tablet, Take 300 mg by mouth daily. , Disp: , Rfl:  .  famotidine (PEPCID) 40 MG tablet, TAKE ONE (1) TABLET EACH DAY, Disp: 90 tablet, Rfl: 1 .  ferrous sulfate 325 (65 FE) MG EC tablet, Take 325 mg by mouth daily. FERROUS SULFATE, 325 (65 Fe)MG (Oral Tablet Delayed Release)  1 every day for 0 days  Quantity: 0.00;  Refills: 0   Ordered :13-Apr-2010  Celene Kras, MA, Anastasiya ;  Started 29-October-2008 Active Comments: DX: 285.9, Disp: , Rfl:  .  fexofenadine (ALLEGRA) 180 MG tablet, Take 180 mg by mouth daily. , Disp: , Rfl:  .  fluticasone (FLONASE) 50 MCG/ACT nasal spray, Place 2 sprays into both nostrils daily., Disp: 48  g, Rfl: 3 .  hydrochlorothiazide (HYDRODIURIL) 25 MG tablet, TAKE ONE (1) TABLET EACH DAY, Disp: 90 tablet, Rfl: 1 .  losartan (COZAAR) 100 MG tablet, TAKE ONE (1) TABLET EACH DAY, Disp: 90 tablet, Rfl: 1 .  Mesalamine (ASACOL) 400 MG CPDR DR capsule, Take 2 capsules (800 mg total) by mouth 2 (two) times daily., Disp: 120 capsule, Rfl: 0 .  montelukast (SINGULAIR) 10 MG tablet, TAKE ONE (1) TABLET AT BEDTIME, Disp: 90 tablet, Rfl: 3 .  MULTIPLE VITAMIN PO, Take 1 tablet by mouth daily. MULTIVITAMINS (Oral Tablet)  1 Every Day for 0 days  Quantity: 0.00;  Refills: 0   Ordered :13-Apr-2010  Celene Kras, MA, Anastasiya ;  Started 29-October-2008 Active Comments: DX: 414.00, Disp: , Rfl:  .  Omega-3  Fatty Acids (FISH OIL) 1000 MG CAPS, Take 1 capsule by mouth daily. FISH OIL CONCENTRATE, 1000MG (Oral Capsule)  1 Every Day for 0 days  Quantity: 0.00;  Refills: 0   Ordered :13-Apr-2010  Celene Kras, MA, Anastasiya ;  Started 29-October-2008 Active Comments: DX: 414.00, Disp: , Rfl:  .  rosuvastatin (CRESTOR) 20 MG tablet, Take 0.5 tablets (10 mg total) by mouth daily., Disp: 45 tablet, Rfl: 3 .  terbinafine (LAMISIL) 250 MG tablet, TAKE ONE (1) TABLET EACH DAY, Disp: 30 tablet, Rfl: 3 .  sulfamethoxazole-trimethoprim (BACTRIM DS,SEPTRA DS) 800-160 MG tablet, Take 1 tablet by mouth 2 (two) times daily. (Patient not taking: Reported on 12/20/2016), Disp: 20 tablet, Rfl: 0  Review of Systems  Constitutional: Negative.   Respiratory: Negative.   Cardiovascular: Negative.   Endocrine: Negative.   Psychiatric/Behavioral: Negative.     Social History  Substance Use Topics  . Smoking status: Former Smoker    Quit date: 05/30/1994  . Smokeless tobacco: Never Used  . Alcohol use No   Objective:   BP 132/80 (BP Location: Left Arm, Patient Position: Sitting, Cuff Size: Normal)   Pulse 77   Temp 98.6 F (37 C) (Oral)   Resp 16   Wt 131 lb 9.6 oz (59.7 kg)   SpO2 97%   BMI 24.87 kg/m    Physical Exam  Constitutional: She appears well-developed and well-nourished. No distress.  Neck: Normal range of motion. Neck supple. No tracheal deviation present. No thyromegaly present.  Referred aortic stenosis heard into bilateral carotid regions. Could be possible bruit, patient is scheduled for repeat carotid duplex in Sept with Dr. Ronalee Belts.  Cardiovascular: Normal rate and regular rhythm.  Exam reveals no gallop and no friction rub.   Murmur heard.  Systolic murmur is present with a grade of 3/6  Pulmonary/Chest: Effort normal and breath sounds normal. No respiratory distress. She has no wheezes. She has no rales.  Musculoskeletal: She exhibits no edema.  Lymphadenopathy:    She has no cervical  adenopathy.  Skin: She is not diaphoretic.  Vitals reviewed.       Assessment & Plan:     1. Hyperthyroidism Previously abnormal. Thought to be stress induced due to the health of her husband. Will check labs as below. I will see her back in 6 months for her wellness.  - TSH - T4  2. Hypercholesteremia Will recheck cholesterol due to patient cutting medication in half. I will f/u pending results.  - Lipid Profile       Mar Daring, [name]  [facility]

## 2016-12-20 NOTE — Patient Instructions (Signed)
Hyperthyroidism Hyperthyroidism is when the thyroid is too active (overactive). Your thyroid is a large gland that is located in your neck. The thyroid helps to control how your body uses food (metabolism). When your thyroid is overactive, it produces too much of a hormone called thyroxine. What are the causes? Causes of hyperthyroidism may include:  Graves disease. This is when your immune system attacks the thyroid gland. This is the most common cause.  Inflammation of the thyroid gland.  Tumor in the thyroid gland or somewhere else.  Excessive use of thyroid medicines, including: ? Prescription thyroid supplement. ? Herbal supplements that mimic thyroid hormones.  Solid or fluid-filled lumps within your thyroid gland (thyroid nodules).  Excessive ingestion of iodine.  What increases the risk?  Being female.  Having a family history of thyroid conditions. What are the signs or symptoms? Signs and symptoms of hyperthyroidism may include:  Nervousness.  Inability to tolerate heat.  Unexplained weight loss.  Diarrhea.  Change in the texture of hair or skin.  Heart skipping beats or making extra beats.  Rapid heart rate.  Loss of menstruation.  Shaky hands.  Fatigue.  Restlessness.  Increased appetite.  Sleep problems.  Enlarged thyroid gland or nodules.  How is this diagnosed? Diagnosis of hyperthyroidism may include:  Medical history and physical exam.  Blood tests.  Ultrasound tests.  How is this treated? Treatment may include:  Medicines to control your thyroid.  Surgery to remove your thyroid.  Radiation therapy.  Follow these instructions at home:  Take medicines only as directed by your health care provider.  Do not use any tobacco products, including cigarettes, chewing tobacco, or electronic cigarettes. If you need help quitting, ask your health care provider.  Do not exercise or do physical activity until your health care provider  approves.  Keep all follow-up appointments as directed by your health care provider. This is important. Contact a health care provider if:  Your symptoms do not get better with treatment.  You have fever.  You are taking thyroid replacement medicine and you: ? Have depression. ? Feel mentally and physically slow. ? Have weight gain. Get help right away if:  You have decreased alertness or a change in your awareness.  You have abdominal pain.  You feel dizzy.  You have a rapid heartbeat.  You have an irregular heartbeat. This information is not intended to replace advice given to you by your health care provider. Make sure you discuss any questions you have with your health care provider. Document Released: 05/17/2005 Document Revised: 10/16/2015 Document Reviewed: 10/02/2013 Elsevier Interactive Patient Education  2017 [employer].

## 2016-12-22 DIAGNOSIS — E7800 Pure hypercholesterolemia, unspecified: Secondary | ICD-10-CM | POA: Diagnosis not present

## 2016-12-22 DIAGNOSIS — E0590 Thyrotoxicosis, unspecified without thyrotoxic crisis or storm: Secondary | ICD-10-CM | POA: Diagnosis not present

## 2016-12-23 ENCOUNTER — Telehealth: Payer: Self-pay

## 2016-12-23 DIAGNOSIS — E0590 Thyrotoxicosis, unspecified without thyrotoxic crisis or storm: Secondary | ICD-10-CM

## 2016-12-23 LAB — LIPID PANEL
CHOL/HDL RATIO: 2.6 ratio (ref 0.0–4.4)
CHOLESTEROL TOTAL: 193 mg/dL (ref 100–199)
HDL: 74 mg/dL (ref >39)
LDL CALC: 100 mg/dL — AB (ref 0–99)
Triglycerides: 93 mg/dL (ref 0–149)
VLDL CHOLESTEROL CAL: 19 mg/dL (ref 5–40)

## 2016-12-23 LAB — T4: T4, Total: 7.3 ug/dL (ref 4.5–12.0)

## 2016-12-23 LAB — TSH: TSH: 0.022 u[IU]/mL — ABNORMAL LOW (ref 0.450–4.500)

## 2016-12-23 NOTE — Telephone Encounter (Signed)
-----   Message from Mar Daring, [name] sent at 12/23/2016  9:32 AM EDT ----- Cholesterol is improved. Thyroid is a little lower than previous. If you are having palpitations, increased anxiety, hot flashes, increased HR, I would recommend referral to endocrinology, if never done before. If you have had work up previously then we can consider adding a medication that blocks the thyroid hormone some. Please let us know your choice.  Thanks.

## 2016-12-23 NOTE — Telephone Encounter (Signed)
Advised patient of results. Patient prefers to see an endocrinologist to see if a medication is needed or to have this followed. She is requesting that this appt be scheduled on a Monday or Friday afternoon after 12. Thanks!

## 2016-12-24 ENCOUNTER — Other Ambulatory Visit: Payer: Self-pay | Admitting: Physician Assistant

## 2016-12-24 DIAGNOSIS — K219 Gastro-esophageal reflux disease without esophagitis: Secondary | ICD-10-CM

## 2016-12-24 NOTE — Telephone Encounter (Signed)
Please review. Thanks!  

## 2016-12-31 DIAGNOSIS — M7581 Other shoulder lesions, right shoulder: Secondary | ICD-10-CM | POA: Diagnosis not present

## 2016-12-31 DIAGNOSIS — M75111 Incomplete rotator cuff tear or rupture of right shoulder, not specified as traumatic: Secondary | ICD-10-CM | POA: Diagnosis not present

## 2016-12-31 DIAGNOSIS — M7541 Impingement syndrome of right shoulder: Secondary | ICD-10-CM | POA: Diagnosis not present

## 2017-01-03 ENCOUNTER — Other Ambulatory Visit: Payer: Self-pay | Admitting: Physician Assistant

## 2017-01-03 DIAGNOSIS — I2119 ST elevation (STEMI) myocardial infarction involving other coronary artery of inferior wall: Secondary | ICD-10-CM | POA: Diagnosis not present

## 2017-01-03 DIAGNOSIS — I10 Essential (primary) hypertension: Secondary | ICD-10-CM | POA: Diagnosis not present

## 2017-01-03 DIAGNOSIS — E782 Mixed hyperlipidemia: Secondary | ICD-10-CM | POA: Diagnosis not present

## 2017-01-03 DIAGNOSIS — I2510 Atherosclerotic heart disease of native coronary artery without angina pectoris: Secondary | ICD-10-CM | POA: Diagnosis not present

## 2017-01-03 DIAGNOSIS — I350 Nonrheumatic aortic (valve) stenosis: Secondary | ICD-10-CM | POA: Diagnosis not present

## 2017-01-03 DIAGNOSIS — I739 Peripheral vascular disease, unspecified: Secondary | ICD-10-CM | POA: Diagnosis not present

## 2017-01-03 NOTE — Telephone Encounter (Signed)
LOV 12/20/2016.

## 2017-02-04 ENCOUNTER — Other Ambulatory Visit (INDEPENDENT_AMBULATORY_CARE_PROVIDER_SITE_OTHER): Payer: Self-pay | Admitting: Vascular Surgery

## 2017-02-04 DIAGNOSIS — I6529 Occlusion and stenosis of unspecified carotid artery: Secondary | ICD-10-CM

## 2017-02-07 ENCOUNTER — Ambulatory Visit (INDEPENDENT_AMBULATORY_CARE_PROVIDER_SITE_OTHER): Payer: PPO

## 2017-02-07 ENCOUNTER — Encounter (INDEPENDENT_AMBULATORY_CARE_PROVIDER_SITE_OTHER): Payer: Self-pay | Admitting: Vascular Surgery

## 2017-02-07 ENCOUNTER — Ambulatory Visit (INDEPENDENT_AMBULATORY_CARE_PROVIDER_SITE_OTHER): Payer: PPO | Admitting: Vascular Surgery

## 2017-02-07 VITALS — BP 147/79 | HR 75 | Resp 17 | Wt 133.0 lb

## 2017-02-07 DIAGNOSIS — Z720 Tobacco use: Secondary | ICD-10-CM | POA: Diagnosis not present

## 2017-02-07 DIAGNOSIS — I6523 Occlusion and stenosis of bilateral carotid arteries: Secondary | ICD-10-CM | POA: Diagnosis not present

## 2017-02-07 DIAGNOSIS — I6529 Occlusion and stenosis of unspecified carotid artery: Secondary | ICD-10-CM | POA: Diagnosis not present

## 2017-02-07 DIAGNOSIS — I739 Peripheral vascular disease, unspecified: Secondary | ICD-10-CM | POA: Diagnosis not present

## 2017-02-07 NOTE — Progress Notes (Signed)
Subjective:    Patient ID: Misty Page, female    DOB: [birth year], 76 y.o.   MRN: [id number] Chief Complaint  Patient presents with  . Carotid    39yrfollow up   Patient presents for a yearly carotid artery stenosis follow-up. Duplex was notable for no significant stenosis of the bilateral internal carotid arteries. Evidence of possible partial right subclavian steal. The patient denies experiencing Amaurosis Fugax, TIA like symptoms or focal motor deficits. Patient is concerned about blood flow to her lower extremity states that she had previous stents placed years ago. He shouldn't is also concerned about her kidneys that she has also had stents placed years ago. She denies any claudication or issues controlling her systolic blood pressure.    Review of Systems  Constitutional: Negative.   HENT: Negative.   Eyes: Negative.   Respiratory: Negative.   Cardiovascular: Negative.   Gastrointestinal: Negative.   Endocrine: Negative.   Genitourinary: Negative.   Musculoskeletal: Negative.   Skin: Negative.   Allergic/Immunologic: Negative.   Neurological: Negative.   Hematological: Negative.   Psychiatric/Behavioral: Negative.       Objective:   Physical Exam  Constitutional: She is oriented to person, place, and time. She appears well-developed and well-nourished. No distress.  HENT:  Head: Normocephalic and atraumatic.  Eyes: Pupils are equal, round, and reactive to light. Conjunctivae are normal.  Cardiovascular: Normal rate, regular rhythm, normal heart sounds and intact distal pulses.   Pulses:      Radial pulses are 2+ on the right side, and 2+ on the left side.       Dorsalis pedis pulses are 1+ on the right side, and 1+ on the left side.       Posterior tibial pulses are 1+ on the right side, and 1+ on the left side.  No carotid bruit noted  Pulmonary/Chest: Effort normal.  Musculoskeletal: Normal range of motion. She exhibits no edema.  Neurological: She is alert and  oriented to person, place, and time.  Skin: Skin is warm and dry. She is not diaphoretic.  Psychiatric: She has a normal mood and affect. Her behavior is normal. Judgment and thought content normal.  Vitals reviewed.   BP (!) 147/79 (BP Location: Left Arm)   Pulse 75   Resp 17   Wt 133 lb (60.3 kg)   BMI 25.13 kg/m   Past Medical History:  Diagnosis Date  . Allergy   . GERD (gastroesophageal reflux disease)   . Hyperlipidemia   . Hypertension   . Osteoporosis   . Thyroid disease     Social History   Social History  . Marital status: Married    Spouse name: TKonrad Dolores . Number of children: 1  . Years of education: N/A   Occupational History  . Not on file.   Social History Main Topics  . Smoking status: Former Smoker    Quit date: 05/30/1994  . Smokeless tobacco: Never Used  . Alcohol use No  . Drug use: No  . Sexual activity: No   Other Topics Concern  . Not on file   Social History Narrative  . No narrative on file    Past Surgical History:  Procedure Laterality Date  . ABDOMINAL HYSTERECTOMY  2007  . BREAST ENHANCEMENT SURGERY  2004  . BREAST SURGERY  1984   Fibrocystic Disease  . CAROTID ENDARTERECTOMY Left 06/21/2014   Dr. SDelana Meyer . CAROTID ENDARTERECTOMY Right 08/02/2014   Dr. SDelana Meyer . COLONOSCOPY  WITH PROPOFOL N/A 07/14/2015   Procedure: COLONOSCOPY WITH PROPOFOL;  Surgeon: Hulen Luster, MD;  [facility] END[facility];  Service: Gastroenterology;  Laterality: N/A;  . CORONARY ANGIOPLASTY WITH STENT PLACEMENT  1999  . FEMORAL ARTERY STENT  08/2003  . HERNIA REPAIR  2008  . RENAL ARTERY STENT  08/2003    Family History  Problem Relation Age of Onset  . Hyperlipidemia Mother   . Congestive Heart Failure Mother   . Pancreatic cancer Father   . Arthritis Sister   . Alzheimer's disease Sister   . Prostate cancer Brother   . Heart attack Brother   . Stomach cancer Brother   . Kidney failure Brother   . Leukemia Brother   . Emphysema Brother      Allergies  Allergen Reactions  . Codeine   . Levofloxacin   . Naproxen   . Penicillins     Rash  . Shellfish Allergy        Assessment & Plan:  Patient presents for a yearly carotid artery stenosis follow-up. Duplex was notable for no significant stenosis of the bilateral internal carotid arteries. Evidence of possible partial right subclavian steal. The patient denies experiencing Amaurosis Fugax, TIA like symptoms or focal motor deficits. Patient is concerned about blood flow to her lower extremity states that she had previous stents placed years ago. He shouldn't is also concerned about her kidneys that she has also had stents placed years ago. She denies any claudication or issues controlling her systolic blood pressure.   1. Peripheral vascular disease ([place]) - New Patient with multiple risk factors for peripheral artery disease. Patient states states a remote history of stents placed in her lower extremity. We will order an ABI to assess if any significant peripheral artery disease exists  2. Current tobacco use - stable We had a discussion for approximately 10 minutes regarding the absolute need for smoking cessation due to the deleterious nature of tobacco on the vascular system. We discussed the tobacco use would diminish patency of any intervention, and likely significantly worsen progressio of disease. We discussed multiple agents for quitting including replacement therapy or medications to reduce cravings such as Chantix. The patient voices their understanding of the importance of smoking cessation.  3. Bilateral carotid artery stenosis - stable Studies reviewed with patient. Patient asymptomatic with stable duplex.  No intervention at this time.  Patient to return in six months for surveillance carotid duplex. I have discussed with the patient at length the risk factors for and pathogenesis of atherosclerotic disease and encouraged a healthy diet, regular exercise regimen  and blood pressure / glucose control.  Patient was instructed to contact our office in the interim with problems such as arm / leg weakness or numbness, speech / swallowing difficulty or temporary monocular blindness. The patient expresses their understanding.   Current Outpatient Prescriptions on File Prior to Visit  Medication Sig Dispense Refill  . ALPRAZolam (XANAX) 0.25 MG tablet Take 1 tablet (0.25 mg total) by mouth 2 (two) times daily as needed for anxiety. 30 tablet 5  . Ascorbic Acid (VITAMIN C) 1000 MG tablet Take 1,000 mg by mouth daily.     Marland Kitchen aspirin 81 MG tablet Take 81 mg by mouth daily. ASPIRIN, 81MG (Oral Tablet)  1 Every Day for 0 days  Quantity: 0.00;  Refills: 0   Ordered :13-Apr-2010  Celene Kras, MA, Anastasiya ;  Started 29-October-2008 Active Comments: DX: 414.00    . B COMPLEX VITAMINS PO Take 1 tablet  by mouth daily. B COMPLEX (Oral Capsule)  1 Every Day for 0 days  Quantity: 0.00;  Refills: 0   Ordered :13-Apr-2010  Celene Kras, MA, Anastasiya ;  Started 29-October-2008 Active Comments: DX: 414.00    . Calcium-Vitamin D-Vitamin K 500-100-40 MG-UNT-MCG CHEW Chew 1 tablet by mouth 2 (two) times daily. VIACTIV, 500-100-40 (Oral Tablet Chewable)  1 tablet twice a day for 0 days  Quantity: 0.00;  Refills: 0   Ordered :13-Apr-2010  Celene Kras, MA, Anastasiya ;  Started 29-October-2008 Active Comments: DX: 733.00    . Coenzyme Q10 (CO Q 10) 100 MG CAPS Take 1 capsule by mouth daily.     . Cranberry 300 MG tablet Take 300 mg by mouth daily.     . famotidine (PEPCID) 40 MG tablet TAKE ONE (1) TABLET BY MOUTH EVERY DAY 90 tablet 1  . ferrous sulfate 325 (65 FE) MG EC tablet Take 325 mg by mouth daily. FERROUS SULFATE, 325 (65 Fe)MG (Oral Tablet Delayed Release)  1 every day for 0 days  Quantity: 0.00;  Refills: 0   Ordered :13-Apr-2010  Celene Kras, MA, Anastasiya ;  Started 29-October-2008 Active Comments: DX: 285.9    . fexofenadine (ALLEGRA) 180 MG tablet Take 180 mg by mouth daily.     .  fluticasone (FLONASE) 50 MCG/ACT nasal spray Place 2 sprays into both nostrils daily. 48 g 3  . hydrochlorothiazide (HYDRODIURIL) 25 MG tablet TAKE ONE (1) TABLET EACH DAY 90 tablet 1  . losartan (COZAAR) 100 MG tablet TAKE ONE (1) TABLET EACH DAY 90 tablet 1  . Mesalamine (ASACOL) 400 MG CPDR DR capsule Take 2 capsules (800 mg total) by mouth 2 (two) times daily. 120 capsule 0  . montelukast (SINGULAIR) 10 MG tablet TAKE ONE (1) TABLET AT BEDTIME 90 tablet 3  . MULTIPLE VITAMIN PO Take 1 tablet by mouth daily. MULTIVITAMINS (Oral Tablet)  1 Every Day for 0 days  Quantity: 0.00;  Refills: 0   Ordered :13-Apr-2010  Celene Kras, MA, Anastasiya ;  Started 29-October-2008 Active Comments: DX: 414.00    . Omega-3 Fatty Acids (FISH OIL) 1000 MG CAPS Take 1 capsule by mouth daily. FISH OIL CONCENTRATE, 1000MG (Oral Capsule)  1 Every Day for 0 days  Quantity: 0.00;  Refills: 0   Ordered :13-Apr-2010  Celene Kras, MA, Anastasiya ;  Started 29-October-2008 Active Comments: DX: 414.00    . rosuvastatin (CRESTOR) 20 MG tablet Take 0.5 tablets (10 mg total) by mouth daily. 45 tablet 3  . terbinafine (LAMISIL) 250 MG tablet TAKE ONE (1) TABLET EACH DAY 30 tablet 3   No current facility-administered medications on file prior to visit.     There are no Patient Instructions on file for this visit. No Follow-up on file.   Aubrey Voong A Klaira Pesci, [name]

## 2017-03-07 ENCOUNTER — Other Ambulatory Visit: Payer: Self-pay | Admitting: Physician Assistant

## 2017-03-28 DIAGNOSIS — E0590 Thyrotoxicosis, unspecified without thyrotoxic crisis or storm: Secondary | ICD-10-CM | POA: Diagnosis not present

## 2017-03-28 DIAGNOSIS — E041 Nontoxic single thyroid nodule: Secondary | ICD-10-CM | POA: Diagnosis not present

## 2017-04-01 ENCOUNTER — Ambulatory Visit (INDEPENDENT_AMBULATORY_CARE_PROVIDER_SITE_OTHER): Payer: PPO | Admitting: Family Medicine

## 2017-04-01 ENCOUNTER — Encounter: Payer: Self-pay | Admitting: Family Medicine

## 2017-04-01 VITALS — BP 136/60 | HR 72 | Temp 98.3°F | Resp 16 | Wt 133.0 lb

## 2017-04-01 DIAGNOSIS — N390 Urinary tract infection, site not specified: Secondary | ICD-10-CM

## 2017-04-01 DIAGNOSIS — R300 Dysuria: Secondary | ICD-10-CM

## 2017-04-01 LAB — POCT URINALYSIS DIPSTICK
Bilirubin, UA: NEGATIVE
GLUCOSE UA: NEGATIVE
KETONES UA: NEGATIVE
Nitrite, UA: NEGATIVE
PROTEIN UA: NEGATIVE
UROBILINOGEN UA: 0.2 U/dL
pH, UA: 7 (ref 5.0–8.0)

## 2017-04-01 MED ORDER — SULFAMETHOXAZOLE-TRIMETHOPRIM 800-160 MG PO TABS: 1.0000 | ORAL_TABLET | Freq: Two times a day (BID) | ORAL | 0 refills | Status: DC

## 2017-04-01 NOTE — Progress Notes (Signed)
Patient: Misty Page Female    DOB: 1940-11-25   76 y.o.   MRN: [id number] Visit Date: 04/01/2017  Today's Provider: Vernie Murders, PA   Chief Complaint  Patient presents with  . Urinary Tract Infection   Subjective:    Urinary Tract Infection   This is a new problem. The current episode started yesterday. The problem has been unchanged. Quality: "sharp" pain in lower abdomen. The pain is at a severity of 6/10. The pain is mild. There has been no fever. Associated symptoms include hesitancy and urgency. Pertinent negatives include no chills, discharge, flank pain, frequency, hematuria, nausea, possible pregnancy, sweats or vomiting. She has tried nothing for the symptoms.   Past Medical History:  Diagnosis Date  . Allergy   . GERD (gastroesophageal reflux disease)   . Hyperlipidemia   . Hypertension   . Osteoporosis   . Thyroid disease    Past Surgical History:  Procedure Laterality Date  . ABDOMINAL HYSTERECTOMY  2007  . BREAST ENHANCEMENT SURGERY  2004  . BREAST SURGERY  1984   Fibrocystic Disease  . CAROTID ENDARTERECTOMY Left 06/21/2014   Dr. Delana Meyer  . CAROTID ENDARTERECTOMY Right 08/02/2014   Dr. Delana Meyer  . COLONOSCOPY WITH PROPOFOL N/A 07/14/2015   Procedure: COLONOSCOPY WITH PROPOFOL;  Surgeon: Hulen Luster, MD;  Location: [facility] ENDOSCOPY;  Service: Gastroenterology;  Laterality: N/A;  . CORONARY ANGIOPLASTY WITH STENT PLACEMENT  1999  . FEMORAL ARTERY STENT  08/2003  . HERNIA REPAIR  2008  . RENAL ARTERY STENT  08/2003   Family History  Problem Relation Age of Onset  . Hyperlipidemia Mother   . Congestive Heart Failure Mother   . Pancreatic cancer Father   . Arthritis Sister   . Alzheimer's disease Sister   . Prostate cancer Brother   . Heart attack Brother   . Stomach cancer Brother   . Kidney failure Brother   . Leukemia Brother   . Emphysema Brother    Allergies  Allergen Reactions  . Codeine   . Levofloxacin   . Naproxen   . Penicillins      Rash  . Shellfish Allergy      Current Outpatient Prescriptions:  .  Ascorbic Acid (VITAMIN C) 1000 MG tablet, Take 1,000 mg by mouth daily. , Disp: , Rfl:  .  aspirin 81 MG tablet, Take 81 mg by mouth daily. ASPIRIN, 81MG (Oral Tablet)  1 Every Day for 0 days  Quantity: 0.00;  Refills: 0   Ordered :13-Apr-2010  Celene Kras, MA, Anastasiya ;  Started 29-October-2008 Active Comments: DX: 414.00, Disp: , Rfl:  .  B COMPLEX VITAMINS PO, Take 1 tablet by mouth daily. B COMPLEX (Oral Capsule)  1 Every Day for 0 days  Quantity: 0.00;  Refills: 0   Ordered :13-Apr-2010  Celene Kras, MA, Anastasiya ;  Started 29-October-2008 Active Comments: DX: 414.00, Disp: , Rfl:  .  Calcium-Vitamin D-Vitamin K 500-100-40 MG-UNT-MCG CHEW, Chew 1 tablet by mouth 2 (two) times daily. VIACTIV, 500-100-40 (Oral Tablet Chewable)  1 tablet twice a day for 0 days  Quantity: 0.00;  Refills: 0   Ordered :13-Apr-2010  Celene Kras, MA, Anastasiya ;  Started 29-October-2008 Active Comments: DX: 733.00, Disp: , Rfl:  .  Coenzyme Q10 (CO Q 10) 100 MG CAPS, Take 1 capsule by mouth daily. , Disp: , Rfl:  .  Cranberry 300 MG tablet, Take 300 mg by mouth daily. , Disp: , Rfl:  .  famotidine (PEPCID) 40  MG tablet, TAKE ONE (1) TABLET BY MOUTH EVERY DAY, Disp: 90 tablet, Rfl: 1 .  ferrous sulfate 325 (65 FE) MG EC tablet, Take 325 mg by mouth daily. FERROUS SULFATE, 325 (65 Fe)MG (Oral Tablet Delayed Release)  1 every day for 0 days  Quantity: 0.00;  Refills: 0   Ordered :13-Apr-2010  Celene Kras, MA, Anastasiya ;  Started 29-October-2008 Active Comments: DX: 285.9, Disp: , Rfl:  .  fexofenadine (ALLEGRA) 180 MG tablet, Take 180 mg by mouth daily. , Disp: , Rfl:  .  fluticasone (FLONASE) 50 MCG/ACT nasal spray, USE 2 SPRAYS EACH NOSTRIL DAILY, Disp: 48 g, Rfl: 1 .  hydrochlorothiazide (HYDRODIURIL) 25 MG tablet, TAKE ONE (1) TABLET EACH DAY, Disp: 90 tablet, Rfl: 1 .  losartan (COZAAR) 100 MG tablet, TAKE ONE (1) TABLET EACH DAY, Disp: 90 tablet,  Rfl: 1 .  Mesalamine (ASACOL) 400 MG CPDR DR capsule, Take 2 capsules (800 mg total) by mouth 2 (two) times daily., Disp: 120 capsule, Rfl: 0 .  montelukast (SINGULAIR) 10 MG tablet, TAKE ONE (1) TABLET AT BEDTIME, Disp: 90 tablet, Rfl: 3 .  MULTIPLE VITAMIN PO, Take 1 tablet by mouth daily. MULTIVITAMINS (Oral Tablet)  1 Every Day for 0 days  Quantity: 0.00;  Refills: 0   Ordered :13-Apr-2010  Celene Kras, MA, Anastasiya ;  Started 29-October-2008 Active Comments: DX: 414.00, Disp: , Rfl:  .  Omega-3 Fatty Acids (FISH OIL) 1000 MG CAPS, Take 1 capsule by mouth daily. FISH OIL CONCENTRATE, 1000MG (Oral Capsule)  1 Every Day for 0 days  Quantity: 0.00;  Refills: 0   Ordered :13-Apr-2010  Celene Kras, MA, Anastasiya ;  Started 29-October-2008 Active Comments: DX: 414.00, Disp: , Rfl:  .  rosuvastatin (CRESTOR) 20 MG tablet, Take 0.5 tablets (10 mg total) by mouth daily., Disp: 45 tablet, Rfl: 3 .  FLUARIX QUADRIVALENT 0.5 ML injection, , Disp: , Rfl:   Review of Systems  Constitutional: Negative for chills.  Gastrointestinal: Negative for nausea and vomiting.  Genitourinary: Positive for hesitancy and urgency. Negative for flank pain, frequency and hematuria.    Social History  Substance Use Topics  . Smoking status: Former Smoker    Quit date: 05/30/1994  . Smokeless tobacco: Never Used  . Alcohol use No   Objective:   BP 136/60 (BP Location: Left Arm, Patient Position: Sitting, Cuff Size: Normal)   Pulse 72   Temp 98.3 F (36.8 C) (Oral)   Resp 16   Wt 133 lb (60.3 kg)   BMI 25.13 kg/m  Vitals:   04/01/17 1411  BP: 136/60  Pulse: 72  Resp: 16  Temp: 98.3 F (36.8 C)  TempSrc: Oral  Weight: 133 lb (60.3 kg)     Physical Exam  Constitutional: She is oriented to person, place, and time. She appears well-developed and well-nourished. No distress.  HENT:  Head: Normocephalic and atraumatic.  Right Ear: Hearing normal.  Left Ear: Hearing normal.  Nose: Nose normal.  Eyes:  Conjunctivae and lids are normal. Right eye exhibits no discharge. Left eye exhibits no discharge. No scleral icterus.  Neck: Neck supple.  Cardiovascular: Regular rhythm.   Murmur heard. Unchanged systolic murmur loudest at the right 2nd ICS at the sternal border. Transmits to both carotids. Well healed scars from both carotid endarterectomies.  Pulmonary/Chest: Effort normal. No respiratory distress.  Abdominal: Soft. Bowel sounds are normal.  Slight pressure discomfort in the lower suprapubic region. No CVA tenderness to percussion posteriorly.  Musculoskeletal: Normal range of motion.  Neurological: She is alert and oriented to person, place, and time.  Skin: Skin is intact. No lesion and no rash noted.  Psychiatric: She has a normal mood and affect. Her speech is normal and behavior is normal. Thought content normal.      Assessment & Plan:     1. Dysuria Onset yesterday and concerned about UTI with plans for a trip out of town next week. Urinalysis showed leukocytes on dipstick. Increase fluids and get C&S. - POCT urinalysis dipstick  2. Acute lower UTI Lower abdomen/suprapubic pressure discomfort over the past day or two. Urine confirms infection and will get C&S. Treat with Septra DS and may use AZO-Standard prn discomfort. Must drink extra fluids and recheck pending culture report. - Urine Culture - sulfamethoxazole-trimethoprim (BACTRIM DS,SEPTRA DS) 800-160 MG tablet; Take 1 tablet by mouth 2 (two) times daily.  Dispense: 14 tablet; Refill: [place], [place]  [facility]

## 2017-04-04 DIAGNOSIS — Z85828 Personal history of other malignant neoplasm of skin: Secondary | ICD-10-CM | POA: Diagnosis not present

## 2017-04-04 DIAGNOSIS — D225 Melanocytic nevi of trunk: Secondary | ICD-10-CM | POA: Diagnosis not present

## 2017-04-04 DIAGNOSIS — L309 Dermatitis, unspecified: Secondary | ICD-10-CM | POA: Diagnosis not present

## 2017-04-04 DIAGNOSIS — L821 Other seborrheic keratosis: Secondary | ICD-10-CM | POA: Diagnosis not present

## 2017-04-05 ENCOUNTER — Telehealth: Payer: Self-pay

## 2017-04-05 LAB — URINE CULTURE
MICRO NUMBER: 81234331
SPECIMEN QUALITY:: ADEQUATE

## 2017-04-05 MED ORDER — NITROFURANTOIN MONOHYD MACRO 100 MG PO CAPS: 100.0000 mg | ORAL_CAPSULE | Freq: Two times a day (BID) | ORAL | 0 refills | Status: DC

## 2017-04-05 NOTE — Telephone Encounter (Signed)
-----   Message from Margo Common, [place] sent at 04/05/2017 12:33 PM EST ----- Culture isolated enterococcus bacteria that is susceptible to nitrofurantoin. Switch to Macrobid 100 mg BID #14. Recheck in 1 week if any symptoms remain.

## 2017-04-05 NOTE — Telephone Encounter (Signed)
Patient advised. RX sent to pharmacy.

## 2017-04-08 DIAGNOSIS — M75111 Incomplete rotator cuff tear or rupture of right shoulder, not specified as traumatic: Secondary | ICD-10-CM | POA: Diagnosis not present

## 2017-04-08 DIAGNOSIS — M7581 Other shoulder lesions, right shoulder: Secondary | ICD-10-CM | POA: Diagnosis not present

## 2017-04-08 DIAGNOSIS — M7541 Impingement syndrome of right shoulder: Secondary | ICD-10-CM | POA: Diagnosis not present

## 2017-04-18 ENCOUNTER — Ambulatory Visit (INDEPENDENT_AMBULATORY_CARE_PROVIDER_SITE_OTHER): Payer: PPO

## 2017-04-18 ENCOUNTER — Other Ambulatory Visit (INDEPENDENT_AMBULATORY_CARE_PROVIDER_SITE_OTHER): Payer: Self-pay | Admitting: Vascular Surgery

## 2017-04-18 ENCOUNTER — Encounter (INDEPENDENT_AMBULATORY_CARE_PROVIDER_SITE_OTHER): Payer: Self-pay | Admitting: Vascular Surgery

## 2017-04-18 ENCOUNTER — Ambulatory Visit (INDEPENDENT_AMBULATORY_CARE_PROVIDER_SITE_OTHER): Payer: PPO | Admitting: Vascular Surgery

## 2017-04-18 VITALS — BP 135/83 | HR 64 | Resp 16 | Ht 61.0 in | Wt 132.0 lb

## 2017-04-18 DIAGNOSIS — I701 Atherosclerosis of renal artery: Secondary | ICD-10-CM | POA: Diagnosis not present

## 2017-04-18 DIAGNOSIS — I10 Essential (primary) hypertension: Secondary | ICD-10-CM

## 2017-04-18 DIAGNOSIS — G459 Transient cerebral ischemic attack, unspecified: Secondary | ICD-10-CM

## 2017-04-18 DIAGNOSIS — Z8673 Personal history of transient ischemic attack (TIA), and cerebral infarction without residual deficits: Secondary | ICD-10-CM | POA: Insufficient documentation

## 2017-04-18 DIAGNOSIS — I739 Peripheral vascular disease, unspecified: Secondary | ICD-10-CM

## 2017-04-18 DIAGNOSIS — I6523 Occlusion and stenosis of bilateral carotid arteries: Secondary | ICD-10-CM | POA: Diagnosis not present

## 2017-04-24 ENCOUNTER — Encounter (INDEPENDENT_AMBULATORY_CARE_PROVIDER_SITE_OTHER): Payer: Self-pay | Admitting: Vascular Surgery

## 2017-04-24 NOTE — Progress Notes (Signed)
MRN : [id number]  Misty Page is a 76 y.o. (15-Aug-1940) female who presents with chief complaint of  Chief Complaint  Patient presents with  . Follow-up    pt conv abi,renal  .  History of Present Illness: The patient returns to the office for followup and review of the noninvasive studies. There have been no interval changes in lower extremity symptoms. No interval shortening of the patient's claudication distance or development of rest pain symptoms. No new ulcers or wounds have occurred since the last visit.  The patient notes she recently had a TIA  The patient denies amaurosis fugax or recent TIA symptoms. There are no recent neurological changes noted. The patient denies history of DVT, PE or superficial thrombophlebitis. The patient denies recent episodes of angina or shortness of breath.     Current Meds  Medication Sig  . Ascorbic Acid (VITAMIN C) 1000 MG tablet Take 1,000 mg by mouth daily.   Marland Kitchen aspirin 81 MG tablet Take 81 mg by mouth daily. ASPIRIN, 81MG (Oral Tablet)  1 Every Day for 0 days  Quantity: 0.00;  Refills: 0   Ordered :13-Apr-2010  Celene Kras, MA, Anastasiya ;  Started 29-October-2008 Active Comments: DX: 414.00  . B COMPLEX VITAMINS PO Take 1 tablet by mouth daily. B COMPLEX (Oral Capsule)  1 Every Day for 0 days  Quantity: 0.00;  Refills: 0   Ordered :13-Apr-2010  Celene Kras, MA, Anastasiya ;  Started 29-October-2008 Active Comments: DX: 414.00  . Calcium-Vitamin D-Vitamin K 500-100-40 MG-UNT-MCG CHEW Chew 1 tablet by mouth 2 (two) times daily. VIACTIV, 500-100-40 (Oral Tablet Chewable)  1 tablet twice a day for 0 days  Quantity: 0.00;  Refills: 0   Ordered :13-Apr-2010  Celene Kras, MA, Anastasiya ;  Started 29-October-2008 Active Comments: DX: 733.00  . Coenzyme Q10 (CO Q 10) 100 MG CAPS Take 1 capsule by mouth daily.   . Cranberry 300 MG tablet Take 300 mg by mouth daily.   . famotidine (PEPCID) 40 MG tablet TAKE ONE (1) TABLET BY MOUTH EVERY DAY  . ferrous  sulfate 325 (65 FE) MG EC tablet Take 325 mg by mouth daily. FERROUS SULFATE, 325 (65 Fe)MG (Oral Tablet Delayed Release)  1 every day for 0 days  Quantity: 0.00;  Refills: 0   Ordered :13-Apr-2010  Celene Kras, MA, Anastasiya ;  Started 29-October-2008 Active Comments: DX: 285.9  . fexofenadine (ALLEGRA) 180 MG tablet Take 180 mg by mouth daily.   Marland Kitchen FLUARIX QUADRIVALENT 0.5 ML injection   . fluticasone (FLONASE) 50 MCG/ACT nasal spray USE 2 SPRAYS EACH NOSTRIL DAILY  . hydrochlorothiazide (HYDRODIURIL) 25 MG tablet TAKE ONE (1) TABLET EACH DAY  . losartan (COZAAR) 100 MG tablet TAKE ONE (1) TABLET EACH DAY  . Mesalamine (ASACOL) 400 MG CPDR DR capsule Take 2 capsules (800 mg total) by mouth 2 (two) times daily.  . methimazole (TAPAZOLE) 5 MG tablet Take by mouth.  . montelukast (SINGULAIR) 10 MG tablet TAKE ONE (1) TABLET AT BEDTIME  . MULTIPLE VITAMIN PO Take 1 tablet by mouth daily. MULTIVITAMINS (Oral Tablet)  1 Every Day for 0 days  Quantity: 0.00;  Refills: 0   Ordered :13-Apr-2010  Celene Kras, MA, Anastasiya ;  Started 29-October-2008 Active Comments: DX: 414.00  . Omega-3 Fatty Acids (FISH OIL) 1000 MG CAPS Take 1 capsule by mouth daily. FISH OIL CONCENTRATE, 1000MG (Oral Capsule)  1 Every Day for 0 days  Quantity: 0.00;  Refills: 0   Ordered :13-Apr-2010  Celene Kras, MA, Anastasiya ;  Started 29-October-2008 Active Comments: DX: 414.00  . rosuvastatin (CRESTOR) 20 MG tablet Take 0.5 tablets (10 mg total) by mouth daily.    Past Medical History:  Diagnosis Date  . Allergy   . GERD (gastroesophageal reflux disease)   . Hyperlipidemia   . Hypertension   . Osteoporosis   . Thyroid disease     Past Surgical History:  Procedure Laterality Date  . ABDOMINAL HYSTERECTOMY  2007  . BREAST ENHANCEMENT SURGERY  2004  . BREAST SURGERY  1984   Fibrocystic Disease  . CAROTID ENDARTERECTOMY Left 06/21/2014   Dr. Delana Meyer  . CAROTID ENDARTERECTOMY Right 08/02/2014   Dr. Delana Meyer  . COLONOSCOPY WITH  PROPOFOL N/A 07/14/2015   Procedure: COLONOSCOPY WITH PROPOFOL;  Surgeon: Hulen Luster, MD;  Location: [facility] ENDOSCOPY;  Service: Gastroenterology;  Laterality: N/A;  . CORONARY ANGIOPLASTY WITH STENT PLACEMENT  1999  . FEMORAL ARTERY STENT  08/2003  . HERNIA REPAIR  2008  . RENAL ARTERY STENT  08/2003    Social History Social History   Tobacco Use  . Smoking status: Former Smoker    Last attempt to quit: 05/30/1994    Years since quitting: 22.9  . Smokeless tobacco: Never Used  Substance Use Topics  . Alcohol use: No  . Drug use: No    Family History Family History  Problem Relation Age of Onset  . Hyperlipidemia Mother   . Congestive Heart Failure Mother   . Pancreatic cancer Father   . Arthritis Sister   . Alzheimer's disease Sister   . Prostate cancer Brother   . Heart attack Brother   . Stomach cancer Brother   . Kidney failure Brother   . Leukemia Brother   . Emphysema Brother     Allergies  Allergen Reactions  . Codeine   . Levofloxacin   . Naproxen   . Penicillins     Rash  . Shellfish Allergy      REVIEW OF SYSTEMS (Negative unless checked)  Constitutional: [] Weight loss  [] Fever  [] Chills Cardiac: [] Chest pain   [] Chest pressure   [] Palpitations   [] Shortness of breath when laying flat   [] Shortness of breath with exertion. Vascular:  [x] Pain in legs with walking   [] Pain in legs at rest  [] History of DVT   [] Phlebitis   [] Swelling in legs   [] Varicose veins   [] Non-healing ulcers Pulmonary:   [] Uses home oxygen   [] Productive cough   [] Hemoptysis   [] Wheeze  [] COPD   [] Asthma Neurologic:  [] Dizziness   [] Seizures   [] History of stroke   [x] History of TIA  [] Aphasia   [] Vissual changes   [] Weakness or numbness in arm   [] Weakness or numbness in leg Musculoskeletal:   [] Joint swelling   [] Joint pain   [] Low back pain Hematologic:  [] Easy bruising  [] Easy bleeding   [] Hypercoagulable state   [] Anemic Gastrointestinal:  [] Diarrhea   [] Vomiting   [] Gastroesophageal reflux/heartburn   [] Difficulty swallowing. Genitourinary:  [] Chronic kidney disease   [] Difficult urination  [] Frequent urination   [] Blood in urine Skin:  [] Rashes   [] Ulcers  Psychological:  [] History of anxiety   []  History of major depression.  Physical Examination  Vitals:   04/18/17 1038  BP: 135/83  Pulse: 64  Resp: 16  Weight: 59.9 kg (132 lb)  Height: 5' 1"  (1.549 m)   Body mass index is 24.94 kg/m. Gen: WD/WN, NAD Head: [place]/AT, No temporalis wasting.  Ear/Nose/Throat: Hearing grossly intact, nares w/o erythema or drainage Eyes:  PER, EOMI, sclera nonicteric.  Neck: Supple, no large masses.   Pulmonary:  Good air movement, no audible wheezing bilaterally, no use of accessory muscles.  Cardiac: RRR, no JVD Vascular:  Vessel Right Left  Radial Palpable Palpable  PT Palpable Palpable  DP Palpable Palpable  Gastrointestinal: Non-distended. No guarding/no peritoneal signs.  Musculoskeletal: M/S 5/5 throughout.  No deformity or atrophy.  Neurologic: CN 2-12 intact. Symmetrical.  Speech is fluent. Motor exam as listed above. Psychiatric: Judgment intact, Mood & affect appropriate for pt's clinical situation. Dermatologic: No rashes or ulcers noted.  No changes consistent with cellulitis. Lymph : No lichenification or skin changes of chronic lymphedema.  CBC Lab Results  Component Value Date   WBC 5.9 06/28/2016   HGB 14.5 06/28/2016   HCT 42.0 06/28/2016   MCV 94 06/28/2016   PLT 355 06/28/2016    BMET    Component Value Date/Time   NA 140 06/28/2016 0909   NA 140 06/22/2014 0417   K 4.1 06/28/2016 0909   K 3.5 06/22/2014 0417   CL 99 06/28/2016 0909   CL 105 06/22/2014 0417   CO2 24 06/28/2016 0909   CO2 27 06/22/2014 0417   GLUCOSE 87 06/28/2016 0909   GLUCOSE 92 06/22/2014 0417   BUN 16 06/28/2016 0909   BUN 9 06/22/2014 0417   CREATININE 0.65 06/28/2016 0909   CREATININE 0.65 06/22/2014 0417   CALCIUM 9.5 06/28/2016 0909    CALCIUM 7.7 (L) 06/22/2014 0417   GFRNONAA 87 06/28/2016 0909   GFRNONAA >60 06/22/2014 0417   GFRNONAA >60 02/26/2013 1510   GFRAA 100 06/28/2016 0909   GFRAA >60 06/22/2014 0417   GFRAA >60 02/26/2013 1510   CrCl cannot be calculated (Patient's most recent lab result is older than the maximum 21 days allowed.).  COAG Lab Results  Component Value Date   INR 1.0 06/22/2014    Radiology No results found.  Assessment/Plan 1. TIA (transient ischemic attack) The patient  Recently had a TIA and has known carotid stenosis.  Patient should undergo CT angiography of the carotid arteries to define the degree of stenosis of the internal carotid arteries bilaterally and the anatomic suitability for surgery vs. intervention.  If the patient does indeed need surgery cardiac clearance will be required, once cleared the patient will be scheduled for surgery.  The risks, benefits and alternative therapies were reviewed in detail with the patient.  All questions were answered.  The patient agrees to proceed with imaging.  Continue antiplatelet therapy as prescribed. Continue management of CAD, HTN and Hyperlipidemia. Healthy heart diet, encouraged exercise at least 4 times per week.   - CT ANGIO NECK W OR WO CONTRAST; Future  2. Bilateral carotid artery stenosis See #1 - CT ANGIO NECK W OR WO CONTRAST; Future  3. Peripheral vascular disease ([place])  Recommend:  The patient has evidence of atherosclerosis of the lower extremities with claudication.  The patient does not voice lifestyle limiting changes at this point in time.  Noninvasive studies do not suggest clinically significant change.  No invasive studies, angiography or surgery at this time The patient should continue walking and begin a more formal exercise program.  The patient should continue antiplatelet therapy and aggressive treatment of the lipid abnormalities  No changes in the patient's medications at this time  The  patient should continue wearing graduated compression socks 10-15 mmHg strength to control the mild edema.    4. Essential (primary) hypertension Continue antihypertensive medications as already ordered,  these medications have been reviewed and there are no changes at this time.     Hortencia Pilar, MD  04/24/2017 4:13 PM

## 2017-04-29 DIAGNOSIS — H3589 Other specified retinal disorders: Secondary | ICD-10-CM | POA: Diagnosis not present

## 2017-04-29 DIAGNOSIS — H26493 Other secondary cataract, bilateral: Secondary | ICD-10-CM | POA: Diagnosis not present

## 2017-05-02 ENCOUNTER — Ambulatory Visit
Admission: RE | Admit: 2017-05-02 | Discharge: 2017-05-02 | Disposition: A | Discharge status: Home / Self Care | Payer: PPO | Source: Ambulatory Visit | Attending: Vascular Surgery | Admitting: Vascular Surgery

## 2017-05-02 DIAGNOSIS — Z9889 Other specified postprocedural states: Secondary | ICD-10-CM | POA: Insufficient documentation

## 2017-05-02 DIAGNOSIS — G459 Transient cerebral ischemic attack, unspecified: Secondary | ICD-10-CM | POA: Diagnosis present

## 2017-05-02 DIAGNOSIS — I700 Atherosclerosis of aorta: Secondary | ICD-10-CM | POA: Diagnosis not present

## 2017-05-02 DIAGNOSIS — J439 Emphysema, unspecified: Secondary | ICD-10-CM | POA: Diagnosis not present

## 2017-05-02 DIAGNOSIS — I771 Stricture of artery: Secondary | ICD-10-CM | POA: Insufficient documentation

## 2017-05-02 DIAGNOSIS — I6523 Occlusion and stenosis of bilateral carotid arteries: Secondary | ICD-10-CM

## 2017-05-02 DIAGNOSIS — I70208 Unspecified atherosclerosis of native arteries of extremities, other extremity: Secondary | ICD-10-CM | POA: Diagnosis not present

## 2017-05-02 LAB — POCT I-STAT CREATININE: Creatinine, Ser: 0.8 mg/dL (ref 0.44–1.00)

## 2017-05-02 MED ORDER — IOPAMIDOL (ISOVUE-370) INJECTION 76%
75.0000 mL | Freq: Once | INTRAVENOUS | Status: AC | PRN
  Administered 2017-05-02: 75 mL via INTRAVENOUS

## 2017-05-09 ENCOUNTER — Ambulatory Visit (INDEPENDENT_AMBULATORY_CARE_PROVIDER_SITE_OTHER): Payer: PPO | Admitting: Vascular Surgery

## 2017-05-10 ENCOUNTER — Telehealth (INDEPENDENT_AMBULATORY_CARE_PROVIDER_SITE_OTHER): Payer: Self-pay | Admitting: Vascular Surgery

## 2017-05-10 NOTE — Telephone Encounter (Signed)
New Message  Pt verbalized wanting her results over the phone so she wouldn't have to come into the office or if we can mail her results instead of her having to try to come into the office in the weather.  Please f/u with pt

## 2017-05-10 NOTE — Telephone Encounter (Signed)
The patient has a high grade stenosis on CTA of the neck.  Strongly encouraged her to meet with Dr. Delana Meyer to review her results and discuss any possible future repair.

## 2017-05-10 NOTE — Telephone Encounter (Signed)
Called the patient back to let her know that she is being advised to keep the appointment with the doctor in order to get her results and to see what the next course of action will be.

## 2017-05-10 NOTE — Telephone Encounter (Signed)
Patient would like to know her results from her CT angio of her neck done on 05/02/17. She would like for them to be mailed if possible or if you can advise me on what to tell the patient.

## 2017-05-30 ENCOUNTER — Encounter (INDEPENDENT_AMBULATORY_CARE_PROVIDER_SITE_OTHER): Payer: Self-pay | Admitting: Vascular Surgery

## 2017-05-30 ENCOUNTER — Ambulatory Visit (INDEPENDENT_AMBULATORY_CARE_PROVIDER_SITE_OTHER): Payer: PPO | Admitting: Vascular Surgery

## 2017-05-30 ENCOUNTER — Encounter (INDEPENDENT_AMBULATORY_CARE_PROVIDER_SITE_OTHER): Payer: Self-pay

## 2017-05-30 VITALS — BP 123/77 | HR 87 | Resp 15 | Ht 61.5 in | Wt 137.0 lb

## 2017-05-30 DIAGNOSIS — E7800 Pure hypercholesterolemia, unspecified: Secondary | ICD-10-CM

## 2017-05-30 DIAGNOSIS — G459 Transient cerebral ischemic attack, unspecified: Secondary | ICD-10-CM | POA: Diagnosis not present

## 2017-05-30 DIAGNOSIS — I6523 Occlusion and stenosis of bilateral carotid arteries: Secondary | ICD-10-CM | POA: Diagnosis not present

## 2017-05-30 DIAGNOSIS — I2510 Atherosclerotic heart disease of native coronary artery without angina pectoris: Secondary | ICD-10-CM

## 2017-05-30 DIAGNOSIS — I10 Essential (primary) hypertension: Secondary | ICD-10-CM

## 2017-05-31 ENCOUNTER — Encounter (INDEPENDENT_AMBULATORY_CARE_PROVIDER_SITE_OTHER): Payer: Self-pay | Admitting: Vascular Surgery

## 2017-05-31 NOTE — Progress Notes (Signed)
MRN : [id number]  Misty Page is a 77 y.o. (Aug 02, 1940) female who presents with chief complaint of  Chief Complaint  Patient presents with  . Follow-up    Recheck  .  History of Present Illness: The patient is seen for follow up evaluation of carotid stenosis status post CT angiogram. CT scan was without complication.  Patient reports that the test went well with no problems or complications.   The patient denies interval amaurosis fugax. There is no recent or interval TIA symptoms or focal motor deficits. There is no prior documented CVA.  The patient is taking enteric-coated aspirin 81 mg daily.  There is no history of migraine headaches. There is no history of seizures.  The patient has a history of coronary artery disease, no recent episodes of angina or shortness of breath. The patient denies PAD or claudication symptoms. There is a history of hyperlipidemia which is being treated with a statin.    CT angiogram is reviewed by me personally and shows >90% stenosis of the innominate artery consistent with calcified plaque at the origin of the innominate artery.   Current Meds  Medication Sig  . Ascorbic Acid (VITAMIN C) 1000 MG tablet Take 1,000 mg by mouth daily.   Marland Kitchen aspirin 81 MG tablet Take 81 mg by mouth daily. ASPIRIN, 81MG (Oral Tablet)  1 Every Day for 0 days  Quantity: 0.00;  Refills: 0   Ordered :13-Apr-2010  Celene Kras, MA, Anastasiya ;  Started 29-October-2008 Active Comments: DX: 414.00  . B COMPLEX VITAMINS PO Take 1 tablet by mouth daily. B COMPLEX (Oral Capsule)  1 Every Day for 0 days  Quantity: 0.00;  Refills: 0   Ordered :13-Apr-2010  Celene Kras, MA, Anastasiya ;  Started 29-October-2008 Active Comments: DX: 414.00  . Calcium-Vitamin D-Vitamin K 500-100-40 MG-UNT-MCG CHEW Chew 1 tablet by mouth 2 (two) times daily. VIACTIV, 500-100-40 (Oral Tablet Chewable)  1 tablet twice a day for 0 days  Quantity: 0.00;  Refills: 0   Ordered :13-Apr-2010  Celene Kras, MA,  Anastasiya ;  Started 29-October-2008 Active Comments: DX: 733.00  . Coenzyme Q10 (CO Q 10) 100 MG CAPS Take 1 capsule by mouth daily.   . Cranberry 300 MG tablet Take 300 mg by mouth daily.   . famotidine (PEPCID) 40 MG tablet TAKE ONE (1) TABLET BY MOUTH EVERY DAY  . ferrous sulfate 325 (65 FE) MG EC tablet Take 325 mg by mouth daily. FERROUS SULFATE, 325 (65 Fe)MG (Oral Tablet Delayed Release)  1 every day for 0 days  Quantity: 0.00;  Refills: 0   Ordered :13-Apr-2010  Celene Kras, MA, Anastasiya ;  Started 29-October-2008 Active Comments: DX: 285.9  . fexofenadine (ALLEGRA) 180 MG tablet Take 180 mg by mouth daily.   Marland Kitchen FLUARIX QUADRIVALENT 0.5 ML injection   . fluticasone (FLONASE) 50 MCG/ACT nasal spray USE 2 SPRAYS EACH NOSTRIL DAILY  . hydrochlorothiazide (HYDRODIURIL) 25 MG tablet TAKE ONE (1) TABLET EACH DAY  . losartan (COZAAR) 100 MG tablet TAKE ONE (1) TABLET EACH DAY  . Mesalamine (ASACOL) 400 MG CPDR DR capsule Take 2 capsules (800 mg total) by mouth 2 (two) times daily.  . methimazole (TAPAZOLE) 5 MG tablet Take by mouth.  . montelukast (SINGULAIR) 10 MG tablet TAKE ONE (1) TABLET AT BEDTIME  . MULTIPLE VITAMIN PO Take 1 tablet by mouth daily. MULTIVITAMINS (Oral Tablet)  1 Every Day for 0 days  Quantity: 0.00;  Refills: 0   Ordered :13-Apr-2010  Celene Kras, MA, Anastasiya ;  Started 29-October-2008 Active Comments: DX: 414.00  . nitrofurantoin, macrocrystal-monohydrate, (MACROBID) 100 MG capsule Take 1 capsule (100 mg total) 2 (two) times daily by mouth.  . Omega-3 Fatty Acids (FISH OIL) 1000 MG CAPS Take 1 capsule by mouth daily. FISH OIL CONCENTRATE, 1000MG (Oral Capsule)  1 Every Day for 0 days  Quantity: 0.00;  Refills: 0   Ordered :13-Apr-2010  Celene Kras, MA, Anastasiya ;  Started 29-October-2008 Active Comments: DX: 414.00  . rosuvastatin (CRESTOR) 20 MG tablet Take 0.5 tablets (10 mg total) by mouth daily.  Marland Kitchen terbinafine (LAMISIL) 250 MG tablet Take by mouth.  . triamcinolone cream  (KENALOG) 0.1 %     Past Medical History:  Diagnosis Date  . Allergy   . GERD (gastroesophageal reflux disease)   . Hyperlipidemia   . Hypertension   . Osteoporosis   . Thyroid disease     Past Surgical History:  Procedure Laterality Date  . ABDOMINAL HYSTERECTOMY  2007  . BREAST ENHANCEMENT SURGERY  2004  . BREAST SURGERY  1984   Fibrocystic Disease  . CAROTID ENDARTERECTOMY Left 06/21/2014   Dr. Delana Meyer  . CAROTID ENDARTERECTOMY Right 08/02/2014   Dr. Delana Meyer  . COLONOSCOPY WITH PROPOFOL N/A 07/14/2015   Procedure: COLONOSCOPY WITH PROPOFOL;  Surgeon: Hulen Luster, MD;  Location: [facility] ENDOSCOPY;  Service: Gastroenterology;  Laterality: N/A;  . CORONARY ANGIOPLASTY WITH STENT PLACEMENT  1999  . FEMORAL ARTERY STENT  08/2003  . HERNIA REPAIR  2008  . RENAL ARTERY STENT  08/2003    Social History Social History   Tobacco Use  . Smoking status: Former Smoker    Last attempt to quit: 05/30/1994    Years since quitting: 23.0  . Smokeless tobacco: Never Used  Substance Use Topics  . Alcohol use: No  . Drug use: No    Family History Family History  Problem Relation Age of Onset  . Hyperlipidemia Mother   . Congestive Heart Failure Mother   . Pancreatic cancer Father   . Arthritis Sister   . Alzheimer's disease Sister   . Prostate cancer Brother   . Heart attack Brother   . Stomach cancer Brother   . Kidney failure Brother   . Leukemia Brother   . Emphysema Brother     Allergies  Allergen Reactions  . Codeine   . Levofloxacin   . Naproxen   . Penicillins     Rash  . Shellfish Allergy      REVIEW OF SYSTEMS (Negative unless checked)  Constitutional: [] Weight loss  [] Fever  [] Chills Cardiac: [] Chest pain   [] Chest pressure   [] Palpitations   [] Shortness of breath when laying flat   [] Shortness of breath with exertion. Vascular:  [] Pain in legs with walking   [] Pain in legs at rest  [] History of DVT   [] Phlebitis   [] Swelling in legs   [] Varicose veins    [] Non-healing ulcers Pulmonary:   [] Uses home oxygen   [] Productive cough   [] Hemoptysis   [] Wheeze  [] COPD   [] Asthma Neurologic:  [] Dizziness   [] Seizures   [] History of stroke   [x] History of TIA  [x] Aphasia   [] Vissual changes   [] Weakness or numbness in arm   [] Weakness or numbness in leg Musculoskeletal:   [] Joint swelling   [] Joint pain   [] Low back pain Hematologic:  [] Easy bruising  [] Easy bleeding   [] Hypercoagulable state   [] Anemic Gastrointestinal:  [] Diarrhea   [] Vomiting  [] Gastroesophageal reflux/heartburn   [] Difficulty swallowing. Genitourinary:  [] Chronic kidney  disease   [] Difficult urination  [] Frequent urination   [] Blood in urine Skin:  [] Rashes   [] Ulcers  Psychological:  [] History of anxiety   []  History of major depression.  Physical Examination  Vitals:   05/30/17 1312  BP: 123/77  Pulse: 87  Resp: 15  Weight: 137 lb (62.1 kg)  Height: 5' 1.5" (1.562 m)   Body mass index is 25.47 kg/m. Gen: WD/WN, NAD Head: [place]/AT, No temporalis wasting.  Ear/Nose/Throat: Hearing grossly intact, nares w/o erythema or drainage Eyes: PER, EOMI, sclera nonicteric.  Neck: Supple, no large masses.   Pulmonary:  Good air movement, no audible wheezing bilaterally, no use of accessory muscles.  Cardiac: RRR, no JVD Vascular: Bilateral carotid bruits; well-healed left incisional scar status post carotid endarterectomy Vessel Right Left  Radial Palpable Palpable  Ulnar Palpable Palpable  Brachial Palpable Palpable  Carotid Palpable Palpable  Gastrointestinal: Non-distended. No guarding/no peritoneal signs.  Musculoskeletal: M/S 5/5 throughout.  No deformity or atrophy.  Neurologic: CN 2-12 intact. Symmetrical.  Speech is fluent. Motor exam as listed above. Psychiatric: Judgment intact, Mood & affect appropriate for pt's clinical situation. Dermatologic: No rashes or ulcers noted.  No changes consistent with cellulitis. Lymph : No lichenification or skin changes of chronic  lymphedema.  CBC Lab Results  Component Value Date   WBC 5.9 06/28/2016   HGB 14.5 06/28/2016   HCT 42.0 06/28/2016   MCV 94 06/28/2016   PLT 355 06/28/2016    BMET    Component Value Date/Time   NA 140 06/28/2016 0909   NA 140 06/22/2014 0417   K 4.1 06/28/2016 0909   K 3.5 06/22/2014 0417   CL 99 06/28/2016 0909   CL 105 06/22/2014 0417   CO2 24 06/28/2016 0909   CO2 27 06/22/2014 0417   GLUCOSE 87 06/28/2016 0909   GLUCOSE 92 06/22/2014 0417   BUN 16 06/28/2016 0909   BUN 9 06/22/2014 0417   CREATININE 0.80 05/02/2017 1624   CREATININE 0.65 06/22/2014 0417   CALCIUM 9.5 06/28/2016 0909   CALCIUM 7.7 (L) 06/22/2014 0417   GFRNONAA 87 06/28/2016 0909   GFRNONAA >60 06/22/2014 0417   GFRNONAA >60 02/26/2013 1510   GFRAA 100 06/28/2016 0909   GFRAA >60 06/22/2014 0417   GFRAA >60 02/26/2013 1510   CrCl cannot be calculated (Patient's most recent lab result is older than the maximum 21 days allowed.).  COAG Lab Results  Component Value Date   INR 1.0 06/22/2014    Radiology Ct Angio Neck W Or Wo Contrast  Result Date: 05/02/2017 CLINICAL DATA:  77 year old female status post prior carotid surgery or stenting to years ago. Several recent episodes of abnormal speech. TIA. EXAM: CT ANGIOGRAPHY NECK TECHNIQUE: Multidetector CT imaging of the neck was performed using the standard protocol during bolus administration of intravenous contrast. Multiplanar CT image reconstructions and MIPs were obtained to evaluate the vascular anatomy. Carotid stenosis measurements (when applicable) are obtained utilizing NASCET criteria, using the distal internal carotid diameter as the denominator. CONTRAST:  39m ISOVUE-370 IOPAMIDOL (ISOVUE-370) INJECTION 76% COMPARISON:  CTA neck 03/25/2014. FINDINGS: Skeleton: Stable visualized osseous structures. Chronic T3 inferior endplate deformity. Visualized paranasal sinuses and mastoids are stable and well pneumatized. Upper chest: Incidental  chronic breast implants. Stable visualized lung parenchyma; mild centrilobular emphysema and dependent pulmonary atelectasis. No superior mediastinal lymphadenopathy. Other neck: Chronic multinodular appearing thyroid goiter. Otherwise negative. No cervical lymphadenopathy. Grossly negative visualized brain parenchyma. Aortic arch: Extensive predominantly calcified atherosclerosis of the  arch and visible ascending aorta. Soft and calcified plaque in the visible descending thoracic aorta. The extent has not significantly changed since 2015. Right carotid system: High-grade radiographic string sign stenosis of the proximal brachiocephalic artery (series 6, image 195 and series 9, image 57) has progressed since 2015. Despite this the right CCA and subclavian origins are patent. No stenosis at the right CCA origin. Intermittent soft calcified plaque in the right CCA proximal to the right carotid bifurcation is stable without stenosis. Postoperative changes to the proximal right ICA since the 2015 study with lateral carotid space surgical clips. Dental streak artifact, but no proximal right ICA stenosis. Negative distal cervical right ICA. Patent visible ICA siphon with mild to moderate calcified plaque. Patent right ICA terminus, fetal type right PCA origin, and visible right anterior circulation. Left carotid system: Calcified plaque at the left CCA origin appears stable since 2015 without stenosis. Mildly tortuous left CCA. Postoperative changes to the proximal left ICA since 2015 with widely patent left carotid bifurcation and cervical left ICA. Visible left ICA siphon is patent with mild to moderate calcified plaque. Left ICA terminus, fetal type left PCA origin and visible left anterior circulation is patent. Vertebral arteries: Calcified plaque at the right subclavian artery origin and proximal segment which are mildly obscured by dense right subclavian vein contrast streak artifact. High-grade proximal right  subclavian artery stenosis is suspected. There is chronic calcified plaque at the right vertebral artery origin with moderate to severe stenosis. The right vertebral is non dominant and diminutive throughout the neck but remains patent to the skullbase and appears stable since 2015. Right vertebral artery V4 segment is patent but irregular. Patent right PICA origin. Diminutive vertebrobasilar system and basilar artery appears to be in part related to fetal type PCA origins. The basilar tip and SCA origins are patent. Chronic bulky calcified plaque at the left subclavian artery origin with high-grade stenosis approaching a radiographic string sign as seen on series 9, image 76. This stenosis is numerically estimated at 73% with respect to the distal vessel. The left subclavian remains patent. The left vertebral artery origin is chronically calcified with severe left vertebral stenosis. Tortuous left V1 segment. The left vertebral artery is dominant and patent beyond its origin to the skullbase without additional stenosis. The left V4 segment is patent without stenosis. The left PICA origin is patent. Review of the MIP images confirms the above findings IMPRESSION: 1. High-grade Radiographic-String-Sign stenosis of the Brachiocephalic Artery origin due to bulky calcified plaque has progressed since 2015. No superimposed CCA origin stenosis, despite chronic calcified plaque on the left. 2. Satisfactory post endarterectomy appearance of both proximal ICAs. No cervical ICA stenosis. ICA siphons are patent with calcified plaque. Both carotid termini are patent. 3. High-grade stenoses also at the: - bilateral Subclavian Artery origins, due to bulky calcified plaque and with each origin approaching a Radiographic-String-Sign. - bilateral Vertebral artery origins. Left vertebral artery is dominant. 4. Aortic Atherosclerosis (ICD10-I70.0) and Emphysema (ICD10-J43.9). Electronically Signed   By: Genevie Ann M.D.   On: 05/02/2017  17:19    Assessment/Plan 1. TIA (transient ischemic attack) Recommend:  The patient is symptomatic with respect to the innominate stenosis.  She has had a total of 3 TIAs all of which consistent with a period of a aphasia.  The patient now has progressed and has a lesion the is >90% at the origin of the innominate artery.  Patient's CT angiography of the carotid arteries confirms greater than 90% stenosis at  the origin of the innominate artery.  The anatomical considerations support stenting over surgery.  This was discussed in detail with the patient.  Also the patient has greater than 90% stenosis at the origin of the left clavian the left carotid endarterectomy site remains widely patent.  The risks, benefits and alternative therapies were reviewed in detail with the patient.  All questions were answered.  The patient agrees to proceed with stenting of the innominate artery.  Also part of the discussion was approached from both the femoral as well as a right brachial artery cutdown.  Use of embolic protection was also discussed.  Continue antiplatelet therapy as prescribed. Continue management of CAD, HTN and Hyperlipidemia. Healthy heart diet, encouraged exercise at least 4 times per week.    A total of 30 minutes was spent with this patient and greater than 50% was spent in counseling and coordination of care with the patient.  Discussion included the treatment options for vascular disease including indications for surgery and intervention.  Also discussed is the appropriate timing of treatment.  In addition medical therapy was discussed.  2. Bilateral carotid artery stenosis See #1  3. Essential (primary) hypertension Continue antihypertensive medications as already ordered, these medications have been reviewed and there are no changes at this time.   4. Arteriosclerosis of coronary artery Continue cardiac and antihypertensive medications as already ordered and reviewed, no changes  at this time.  Continue statin as ordered and reviewed, no changes at this time  Nitrates PRN for chest pain   5. Hypercholesteremia Continue statin as ordered and reviewed, no changes at this time     Hortencia Pilar, MD  05/31/2017 3:11 PM

## 2017-06-06 ENCOUNTER — Other Ambulatory Visit: Payer: Self-pay | Admitting: Physician Assistant

## 2017-06-06 DIAGNOSIS — I10 Essential (primary) hypertension: Secondary | ICD-10-CM

## 2017-06-08 ENCOUNTER — Other Ambulatory Visit (INDEPENDENT_AMBULATORY_CARE_PROVIDER_SITE_OTHER): Payer: Self-pay | Admitting: Vascular Surgery

## 2017-06-08 ENCOUNTER — Encounter
Admission: RE | Admit: 2017-06-08 | Discharge: 2017-06-08 | Disposition: A | Discharge status: Home / Self Care | Payer: PPO | Source: Ambulatory Visit | Attending: Vascular Surgery | Admitting: Vascular Surgery

## 2017-06-08 ENCOUNTER — Other Ambulatory Visit: Payer: Self-pay

## 2017-06-08 DIAGNOSIS — I6523 Occlusion and stenosis of bilateral carotid arteries: Secondary | ICD-10-CM | POA: Diagnosis not present

## 2017-06-08 DIAGNOSIS — G459 Transient cerebral ischemic attack, unspecified: Secondary | ICD-10-CM | POA: Diagnosis not present

## 2017-06-08 DIAGNOSIS — I350 Nonrheumatic aortic (valve) stenosis: Secondary | ICD-10-CM | POA: Diagnosis not present

## 2017-06-08 DIAGNOSIS — Z720 Tobacco use: Secondary | ICD-10-CM | POA: Insufficient documentation

## 2017-06-08 DIAGNOSIS — I252 Old myocardial infarction: Secondary | ICD-10-CM | POA: Diagnosis not present

## 2017-06-08 DIAGNOSIS — I2510 Atherosclerotic heart disease of native coronary artery without angina pectoris: Secondary | ICD-10-CM | POA: Diagnosis not present

## 2017-06-08 DIAGNOSIS — Z01812 Encounter for preprocedural laboratory examination: Secondary | ICD-10-CM | POA: Insufficient documentation

## 2017-06-08 DIAGNOSIS — Z85828 Personal history of other malignant neoplasm of skin: Secondary | ICD-10-CM | POA: Diagnosis not present

## 2017-06-08 HISTORY — DX: Anemia, unspecified: D64.9

## 2017-06-08 HISTORY — DX: Acute myocardial infarction, unspecified: I21.9

## 2017-06-08 HISTORY — DX: Other allergic rhinitis: J30.89

## 2017-06-08 HISTORY — DX: Unspecified osteoarthritis, unspecified site: M19.90

## 2017-06-08 HISTORY — DX: Noninfective gastroenteritis and colitis, unspecified: K52.9

## 2017-06-08 HISTORY — DX: Adverse effect of unspecified anesthetic, initial encounter: T41.45XA

## 2017-06-08 HISTORY — DX: Other complications of anesthesia, initial encounter: T88.59XA

## 2017-06-08 HISTORY — DX: Transient cerebral ischemic attack, unspecified: G45.9

## 2017-06-08 LAB — BUN: BUN: 15 mg/dL (ref 6–20)

## 2017-06-08 LAB — SURGICAL PCR SCREEN
MRSA, PCR: NEGATIVE
STAPHYLOCOCCUS AUREUS: NEGATIVE

## 2017-06-08 LAB — CREATININE, SERUM
Creatinine, Ser: 0.71 mg/dL (ref 0.44–1.00)
GFR calc Af Amer: >60 mL/min (ref >60)

## 2017-06-08 NOTE — Patient Instructions (Addendum)
Your procedure is scheduled on: Report to Same Day Surgery 2nd floor medical mall [facility] Entrance-take elevator on left to 2nd floor.  Check in with surgery information desk.) To find out your arrival time please call [phone] between 1PM - 3PM on  Remember: Instructions that are not followed completely may result in serious medical risk, up to and including death, or upon the discretion of your surgeon and anesthesiologist your surgery may need to be rescheduled.    _x___ 1. Do not eat food after midnight the night before your procedure. You may drink clear liquids up to 2 hours before you are scheduled to arrive at the hospital for your procedure.  Do not drink clear liquids within 2 hours of your scheduled arrival to the hospital.  Clear liquids include  --Water or Apple juice without pulp  --Clear carbohydrate beverage such as ClearFast or Gatorade  --Black Coffee or Clear Tea (No milk, no creamers, do not add anything to                  the coffee or Tea Type 1 and type 2 diabetics should only drink water.  No gum chewing or hard candies.     __x__ 2. No Alcohol for 24 hours before or after surgery.   __x__3. No Smoking for 24 prior to surgery.   ____  4. Bring all medications with you on the day of surgery if instructed.    __x__ 5. Notify your doctor if there is any change in your medical condition     (cold, fever, infections).     Do not wear jewelry, make-up, hairpins, clips or nail polish.  Do not wear lotions, powders, or perfumes. You may wear deodorant.  Do not shave 48 hours prior to surgery. Men may shave face and neck.  Do not bring valuables to the hospital.    [facility] is not responsible for any belongings or valuables.               Contacts, dentures or bridgework may not be worn into surgery.  Leave your suitcase in the car. After surgery it may be brought to your room.  For patients admitted to the hospital, discharge time is determined by  your                       treatment team.   Patients discharged the day of surgery will not be allowed to drive home.  You will need someone to drive you home and stay with you the night of your procedure.    Please read over the following fact sheets that you were given:   [facility] Preparing for Surgery and or MRSA Information   _x___ Take anti-hypertensive listed below, cardiac, seizure, asthma,     anti-reflux and psychiatric medicines. These include:  1  2.  3.  4.  5.  6. _x___ Use CHG Soap or sage wipes as directed on instruction sheet   ____ Use inhalers on the day of surgery and bring to hospital day of surgery  ____ Stop Metformin and Janumet 2 days prior to surgery.    ____ Take 1/2 of usual insulin dose the night before surgery and none on the morning     surgery.   _x___ Follow recommendations from Cardiologist, Pulmonologist or PCP regarding stopping Aspirin, Coumadin, Plavix ,Eliquis, Effient, or Pradaxa, and Pletal.  X____Stop Anti-inflammatories such as Advil, Aleve, Ibuprofen, Motrin, Naproxen, Naprosyn, Goodies powders  or aspirin products. OK to take Tylenol and                          Celebrex.   _x___ Stop supplements until after surgery.  But may continue Vitamin D, Vitamin B,       and multivitamin.  Stop Vitamin E, fish oil   ____ Bring C-Pap to the hospital.

## 2017-06-14 MED ORDER — CLINDAMYCIN PHOSPHATE 300 MG/50ML IV SOLN
300.0000 mg | Freq: Once | INTRAVENOUS | Status: AC
  Administered 2017-06-15: 300 mg via INTRAVENOUS

## 2017-06-15 ENCOUNTER — Encounter
Admission: AD | Disposition: A | Discharge status: Home / Self Care | Payer: Self-pay | Source: Ambulatory Visit | Attending: Vascular Surgery

## 2017-06-15 ENCOUNTER — Ambulatory Visit: Payer: PPO | Admitting: Anesthesiology

## 2017-06-15 ENCOUNTER — Encounter: Payer: Self-pay | Admitting: Anesthesiology

## 2017-06-15 ENCOUNTER — Inpatient Hospital Stay
Admission: AD | Admit: 2017-06-15 | Discharge: 2017-06-16 | DRG: 254 | Disposition: A | Discharge status: Home / Self Care | Payer: PPO | Source: Ambulatory Visit | Attending: Vascular Surgery | Admitting: Vascular Surgery

## 2017-06-15 DIAGNOSIS — I739 Peripheral vascular disease, unspecified: Secondary | ICD-10-CM | POA: Diagnosis present

## 2017-06-15 DIAGNOSIS — I6529 Occlusion and stenosis of unspecified carotid artery: Secondary | ICD-10-CM

## 2017-06-15 DIAGNOSIS — E785 Hyperlipidemia, unspecified: Secondary | ICD-10-CM | POA: Diagnosis present

## 2017-06-15 DIAGNOSIS — I771 Stricture of artery: Secondary | ICD-10-CM | POA: Diagnosis not present

## 2017-06-15 DIAGNOSIS — I2510 Atherosclerotic heart disease of native coronary artery without angina pectoris: Secondary | ICD-10-CM | POA: Diagnosis not present

## 2017-06-15 DIAGNOSIS — E7800 Pure hypercholesterolemia, unspecified: Secondary | ICD-10-CM | POA: Diagnosis not present

## 2017-06-15 DIAGNOSIS — Z8042 Family history of malignant neoplasm of prostate: Secondary | ICD-10-CM | POA: Diagnosis not present

## 2017-06-15 DIAGNOSIS — Z955 Presence of coronary angioplasty implant and graft: Secondary | ICD-10-CM

## 2017-06-15 DIAGNOSIS — Z7951 Long term (current) use of inhaled steroids: Secondary | ICD-10-CM

## 2017-06-15 DIAGNOSIS — I6523 Occlusion and stenosis of bilateral carotid arteries: Secondary | ICD-10-CM | POA: Diagnosis present

## 2017-06-15 DIAGNOSIS — I10 Essential (primary) hypertension: Secondary | ICD-10-CM | POA: Diagnosis present

## 2017-06-15 DIAGNOSIS — Z91013 Allergy to seafood: Secondary | ICD-10-CM

## 2017-06-15 DIAGNOSIS — Z87891 Personal history of nicotine dependence: Secondary | ICD-10-CM | POA: Diagnosis not present

## 2017-06-15 DIAGNOSIS — M810 Age-related osteoporosis without current pathological fracture: Secondary | ICD-10-CM | POA: Diagnosis not present

## 2017-06-15 DIAGNOSIS — Z880 Allergy status to penicillin: Secondary | ICD-10-CM

## 2017-06-15 DIAGNOSIS — K219 Gastro-esophageal reflux disease without esophagitis: Secondary | ICD-10-CM | POA: Diagnosis present

## 2017-06-15 DIAGNOSIS — Z885 Allergy status to narcotic agent status: Secondary | ICD-10-CM

## 2017-06-15 DIAGNOSIS — Z8261 Family history of arthritis: Secondary | ICD-10-CM

## 2017-06-15 DIAGNOSIS — Z7982 Long term (current) use of aspirin: Secondary | ICD-10-CM | POA: Diagnosis not present

## 2017-06-15 DIAGNOSIS — Z800 Family history of malignant neoplasm of digestive organs: Secondary | ICD-10-CM | POA: Diagnosis not present

## 2017-06-15 DIAGNOSIS — I6521 Occlusion and stenosis of right carotid artery: Secondary | ICD-10-CM | POA: Diagnosis not present

## 2017-06-15 DIAGNOSIS — Z881 Allergy status to other antibiotic agents status: Secondary | ICD-10-CM | POA: Diagnosis not present

## 2017-06-15 DIAGNOSIS — I708 Atherosclerosis of other arteries: Secondary | ICD-10-CM | POA: Diagnosis not present

## 2017-06-15 DIAGNOSIS — Z8673 Personal history of transient ischemic attack (TIA), and cerebral infarction without residual deficits: Secondary | ICD-10-CM | POA: Diagnosis not present

## 2017-06-15 DIAGNOSIS — Z8249 Family history of ischemic heart disease and other diseases of the circulatory system: Secondary | ICD-10-CM

## 2017-06-15 DIAGNOSIS — Z90710 Acquired absence of both cervix and uterus: Secondary | ICD-10-CM | POA: Diagnosis not present

## 2017-06-15 DIAGNOSIS — E0590 Thyrotoxicosis, unspecified without thyrotoxic crisis or storm: Secondary | ICD-10-CM | POA: Diagnosis not present

## 2017-06-15 HISTORY — PX: CAROTID PTA/STENT INTERVENTION: CATH118231

## 2017-06-15 LAB — GLUCOSE, CAPILLARY: GLUCOSE-CAPILLARY: 116 mg/dL — AB (ref 65–99)

## 2017-06-15 LAB — POCT ACTIVATED CLOTTING TIME: Activated Clotting Time: 235 seconds

## 2017-06-15 SURGERY — CAROTID PTA/STENT INTERVENTION
Anesthesia: General

## 2017-06-15 MED ORDER — IOPAMIDOL (ISOVUE-300) INJECTION 61%
INTRAVENOUS | Status: DC | PRN
  Administered 2017-06-15: 50 mL via INTRA_ARTERIAL

## 2017-06-15 MED ORDER — HYDRALAZINE HCL 20 MG/ML IJ SOLN: 5.0000 mg | INTRAMUSCULAR | Status: DC | PRN

## 2017-06-15 MED ORDER — CLOPIDOGREL BISULFATE 300 MG PO TABS
300.0000 mg | ORAL_TABLET | ORAL | Status: AC
  Filled 2017-06-15: qty 1

## 2017-06-15 MED ORDER — LIDOCAINE HCL (PF) 1 % IJ SOLN
INTRAMUSCULAR | Status: AC
  Filled 2017-06-15: qty 30

## 2017-06-15 MED ORDER — TRAMADOL HCL 50 MG PO TABS: 50.0000 mg | ORAL_TABLET | Freq: Four times a day (QID) | ORAL | Status: DC | PRN

## 2017-06-15 MED ORDER — SUCCINYLCHOLINE CHLORIDE 20 MG/ML IJ SOLN
INTRAMUSCULAR | Status: AC
  Filled 2017-06-15: qty 1

## 2017-06-15 MED ORDER — VITAMIN E 45 MG (100 UNIT) PO CAPS
400.0000 [IU] | ORAL_CAPSULE | Freq: Every day | ORAL | Status: DC
  Filled 2017-06-15: qty 4
  Filled 2017-06-15: qty 1

## 2017-06-15 MED ORDER — LIDOCAINE HCL (CARDIAC) 20 MG/ML IV SOLN
INTRAVENOUS | Status: DC | PRN
  Administered 2017-06-15: 100 mg via INTRAVENOUS

## 2017-06-15 MED ORDER — HEPARIN (PORCINE) IN NACL 2-0.9 UNIT/ML-% IJ SOLN
INTRAMUSCULAR | Status: AC
Start: 2017-06-15 — End: 2017-06-15
  Filled 2017-06-15: qty 1000

## 2017-06-15 MED ORDER — ONDANSETRON HCL 4 MG/2ML IJ SOLN
INTRAMUSCULAR | Status: DC | PRN
  Administered 2017-06-15: 4 mg via INTRAVENOUS

## 2017-06-15 MED ORDER — ONDANSETRON HCL 4 MG/2ML IJ SOLN: 4.0000 mg | Freq: Four times a day (QID) | INTRAMUSCULAR | Status: DC | PRN

## 2017-06-15 MED ORDER — METHIMAZOLE 5 MG PO TABS
2.5000 mg | ORAL_TABLET | Freq: Every day | ORAL | Status: DC
  Administered 2017-06-16: 2.5 mg via ORAL
  Filled 2017-06-15 (×2): qty 1

## 2017-06-15 MED ORDER — MONTELUKAST SODIUM 10 MG PO TABS
10.0000 mg | ORAL_TABLET | Freq: Every day | ORAL | Status: DC
  Administered 2017-06-15: 10 mg via ORAL
  Filled 2017-06-15: qty 1

## 2017-06-15 MED ORDER — CLINDAMYCIN PHOSPHATE 300 MG/50ML IV SOLN
INTRAVENOUS | Status: AC
  Filled 2017-06-15: qty 50

## 2017-06-15 MED ORDER — METHYLPREDNISOLONE SODIUM SUCC 125 MG IJ SOLR
INTRAMUSCULAR | Status: AC
  Filled 2017-06-15: qty 2

## 2017-06-15 MED ORDER — KCL IN DEXTROSE-NACL 20-5-0.9 MEQ/L-%-% IV SOLN
INTRAVENOUS | Status: DC
  Administered 2017-06-15 – 2017-06-16 (×2): via INTRAVENOUS
  Filled 2017-06-15 (×3): qty 1000

## 2017-06-15 MED ORDER — FENTANYL CITRATE (PF) 100 MCG/2ML IJ SOLN
INTRAMUSCULAR | Status: AC
  Filled 2017-06-15: qty 2

## 2017-06-15 MED ORDER — GUAIFENESIN-DM 100-10 MG/5ML PO SYRP
15.0000 mL | ORAL_SOLUTION | ORAL | Status: DC | PRN
  Filled 2017-06-15: qty 15

## 2017-06-15 MED ORDER — METOPROLOL TARTRATE 5 MG/5ML IV SOLN: 2.0000 mg | INTRAVENOUS | Status: DC | PRN

## 2017-06-15 MED ORDER — LORATADINE 10 MG PO TABS: 10.0000 mg | ORAL_TABLET | Freq: Every day | ORAL | Status: DC

## 2017-06-15 MED ORDER — DEXAMETHASONE SODIUM PHOSPHATE 10 MG/ML IJ SOLN
INTRAMUSCULAR | Status: AC
  Filled 2017-06-15: qty 1

## 2017-06-15 MED ORDER — ROSUVASTATIN CALCIUM 10 MG PO TABS
10.0000 mg | ORAL_TABLET | Freq: Every day | ORAL | Status: DC
  Administered 2017-06-15: 10 mg via ORAL
  Filled 2017-06-15: qty 1

## 2017-06-15 MED ORDER — ONDANSETRON HCL 4 MG/2ML IJ SOLN: 4.0000 mg | Freq: Once | INTRAMUSCULAR | Status: DC | PRN

## 2017-06-15 MED ORDER — ASPIRIN EC 81 MG PO TBEC
81.0000 mg | DELAYED_RELEASE_TABLET | Freq: Every day | ORAL | Status: DC
  Administered 2017-06-15 – 2017-06-16 (×2): 81 mg via ORAL
  Filled 2017-06-15 (×2): qty 1

## 2017-06-15 MED ORDER — SODIUM CHLORIDE 0.9 % IV SOLN: 500.0000 mL | Freq: Once | INTRAVENOUS | Status: DC | PRN

## 2017-06-15 MED ORDER — SUGAMMADEX SODIUM 200 MG/2ML IV SOLN
INTRAVENOUS | Status: AC
  Filled 2017-06-15: qty 2

## 2017-06-15 MED ORDER — ONDANSETRON HCL 4 MG/2ML IJ SOLN
INTRAMUSCULAR | Status: AC
  Filled 2017-06-15: qty 2

## 2017-06-15 MED ORDER — FENTANYL CITRATE (PF) 100 MCG/2ML IJ SOLN: 25.0000 ug | INTRAMUSCULAR | Status: DC | PRN

## 2017-06-15 MED ORDER — HYDROCHLOROTHIAZIDE 25 MG PO TABS
25.0000 mg | ORAL_TABLET | Freq: Two times a day (BID) | ORAL | Status: DC
  Administered 2017-06-15 – 2017-06-16 (×2): 25 mg via ORAL
  Filled 2017-06-15 (×3): qty 1

## 2017-06-15 MED ORDER — ACETAMINOPHEN 650 MG RE SUPP: 325.0000 mg | RECTAL | Status: DC | PRN

## 2017-06-15 MED ORDER — PHENOL 1.4 % MT LIQD
1.0000 | OROMUCOSAL | Status: DC | PRN
  Filled 2017-06-15: qty 177

## 2017-06-15 MED ORDER — ROCURONIUM BROMIDE 50 MG/5ML IV SOLN
INTRAVENOUS | Status: AC
  Filled 2017-06-15: qty 2

## 2017-06-15 MED ORDER — CRANBERRY 300 MG PO TABS: 300.0000 mg | ORAL_TABLET | Freq: Every day | ORAL | Status: DC

## 2017-06-15 MED ORDER — DEXAMETHASONE SODIUM PHOSPHATE 10 MG/ML IJ SOLN
INTRAMUSCULAR | Status: DC | PRN
  Administered 2017-06-15: 10 mg via INTRAVENOUS

## 2017-06-15 MED ORDER — LOSARTAN POTASSIUM 50 MG PO TABS
100.0000 mg | ORAL_TABLET | Freq: Every day | ORAL | Status: DC
  Administered 2017-06-15 – 2017-06-16 (×2): 100 mg via ORAL
  Filled 2017-06-15 (×2): qty 2

## 2017-06-15 MED ORDER — FAMOTIDINE 20 MG PO TABS
ORAL_TABLET | ORAL | Status: AC
  Filled 2017-06-15: qty 2

## 2017-06-15 MED ORDER — FENTANYL CITRATE (PF) 100 MCG/2ML IJ SOLN
INTRAMUSCULAR | Status: DC | PRN
  Administered 2017-06-15 (×2): 50 ug via INTRAVENOUS

## 2017-06-15 MED ORDER — HEPARIN SODIUM (PORCINE) 1000 UNIT/ML IJ SOLN
INTRAMUSCULAR | Status: DC | PRN
  Administered 2017-06-15: 6000 [IU] via INTRAVENOUS
  Administered 2017-06-15: 2000 [IU] via INTRAVENOUS

## 2017-06-15 MED ORDER — COQ10 200 MG PO CAPS: 1.0000 | ORAL_CAPSULE | Freq: Every day | ORAL | Status: DC

## 2017-06-15 MED ORDER — SODIUM CHLORIDE 0.9 % IV SOLN
INTRAVENOUS | Status: DC | PRN
  Administered 2017-06-15: 40 ug/min via INTRAVENOUS

## 2017-06-15 MED ORDER — VITAMIN C 500 MG PO TABS
500.0000 mg | ORAL_TABLET | Freq: Every day | ORAL | Status: DC
  Administered 2017-06-15: 500 mg via ORAL
  Filled 2017-06-15 (×2): qty 1

## 2017-06-15 MED ORDER — ASPIRIN 81 MG PO TABS: 81.0000 mg | ORAL_TABLET | Freq: Every day | ORAL | Status: DC

## 2017-06-15 MED ORDER — ROCURONIUM BROMIDE 100 MG/10ML IV SOLN
INTRAVENOUS | Status: DC | PRN
  Administered 2017-06-15 (×2): 10 mg via INTRAVENOUS
  Administered 2017-06-15: 40 mg via INTRAVENOUS

## 2017-06-15 MED ORDER — PANTOPRAZOLE SODIUM 40 MG IV SOLR
40.0000 mg | Freq: Every day | INTRAVENOUS | Status: DC
  Administered 2017-06-15: 40 mg via INTRAVENOUS
  Filled 2017-06-15: qty 40

## 2017-06-15 MED ORDER — ALUM & MAG HYDROXIDE-SIMETH 200-200-20 MG/5ML PO SUSP
15.0000 mL | ORAL | Status: DC | PRN
  Filled 2017-06-15: qty 30

## 2017-06-15 MED ORDER — SUCCINYLCHOLINE CHLORIDE 20 MG/ML IJ SOLN
INTRAMUSCULAR | Status: DC | PRN
  Administered 2017-06-15: 100 mg via INTRAVENOUS

## 2017-06-15 MED ORDER — FAMOTIDINE 20 MG PO TABS
40.0000 mg | ORAL_TABLET | ORAL | Status: DC | PRN
  Administered 2017-06-15: 40 mg via ORAL

## 2017-06-15 MED ORDER — METHYLPREDNISOLONE SODIUM SUCC 125 MG IJ SOLR
125.0000 mg | INTRAMUSCULAR | Status: DC | PRN
  Administered 2017-06-15: 125 mg via INTRAVENOUS

## 2017-06-15 MED ORDER — HYDROMORPHONE HCL 1 MG/ML IJ SOLN: 1.0000 mg | Freq: Once | INTRAMUSCULAR | Status: DC | PRN

## 2017-06-15 MED ORDER — SUGAMMADEX SODIUM 200 MG/2ML IV SOLN
INTRAVENOUS | Status: DC | PRN
  Administered 2017-06-15: 122.4 mg via INTRAVENOUS

## 2017-06-15 MED ORDER — HEPARIN SODIUM (PORCINE) 1000 UNIT/ML IJ SOLN
INTRAMUSCULAR | Status: AC
  Filled 2017-06-15: qty 1

## 2017-06-15 MED ORDER — LABETALOL HCL 5 MG/ML IV SOLN: 10.0000 mg | INTRAVENOUS | Status: DC | PRN

## 2017-06-15 MED ORDER — PROPOFOL 10 MG/ML IV BOLUS
INTRAVENOUS | Status: AC
  Filled 2017-06-15: qty 20

## 2017-06-15 MED ORDER — NITROFURANTOIN MONOHYD MACRO 100 MG PO CAPS
100.0000 mg | ORAL_CAPSULE | Freq: Two times a day (BID) | ORAL | Status: DC
  Administered 2017-06-15 – 2017-06-16 (×3): 100 mg via ORAL
  Filled 2017-06-15 (×4): qty 1

## 2017-06-15 MED ORDER — LIDOCAINE HCL (PF) 2 % IJ SOLN
INTRAMUSCULAR | Status: AC
  Filled 2017-06-15: qty 10

## 2017-06-15 MED ORDER — SODIUM CHLORIDE 0.9 % IV SOLN
INTRAVENOUS | Status: DC
  Administered 2017-06-15: 08:00:00 via INTRAVENOUS

## 2017-06-15 MED ORDER — CLOPIDOGREL BISULFATE 75 MG PO TABS
75.0000 mg | ORAL_TABLET | Freq: Every day | ORAL | Status: DC
  Administered 2017-06-16: 75 mg via ORAL

## 2017-06-15 MED ORDER — DOCUSATE SODIUM 100 MG PO CAPS: 100.0000 mg | ORAL_CAPSULE | Freq: Every day | ORAL | Status: DC

## 2017-06-15 MED ORDER — PROPOFOL 10 MG/ML IV BOLUS
INTRAVENOUS | Status: DC | PRN
  Administered 2017-06-15: 30 mg via INTRAVENOUS
  Administered 2017-06-15: 130 mg via INTRAVENOUS

## 2017-06-15 MED ORDER — VITAMIN C 500 MG PO TABS: 500.0000 mg | ORAL_TABLET | Freq: Every day | ORAL | Status: DC

## 2017-06-15 MED ORDER — ACETAMINOPHEN 325 MG PO TABS: 325.0000 mg | ORAL_TABLET | ORAL | Status: DC | PRN

## 2017-06-15 SURGICAL SUPPLY — 57 items
ADH SKN CLS APL DERMABOND .7 (GAUZE/BANDAGES/DRESSINGS) ×1
BLADE SURG SZ11 CARB STEEL (BLADE) ×2 IMPLANT
BOOT SUTURE AID YELLOW STND (SUTURE) ×2 IMPLANT
CATH BEACON 5 .035 65 KMP TIP (CATHETERS) ×2 IMPLANT
CATH JB1 5FR 125CM (CATHETERS) ×2 IMPLANT
CATH SIZING 5F 100CM .035 PIG (CATHETERS) ×2 IMPLANT
DERMABOND ADVANCED (GAUZE/BANDAGES/DRESSINGS) ×2
DERMABOND ADVANCED .7 DNX12 (GAUZE/BANDAGES/DRESSINGS) IMPLANT
DEVICE PRESTO INFLATION (MISCELLANEOUS) ×3 IMPLANT
DEVICE SAFEGUARD 24CM (GAUZE/BANDAGES/DRESSINGS) ×2 IMPLANT
DEVICE STARCLOSE SE CLOSURE (Vascular Products) ×2 IMPLANT
DRAPE BRACHIAL (DRAPES) ×2 IMPLANT
ELECT CAUTERY BLADE 6.4 (BLADE) ×2 IMPLANT
ELECT REM PT RETURN 9FT ADLT (ELECTROSURGICAL) ×3
ELECTRODE REM PT RTRN 9FT ADLT (ELECTROSURGICAL) IMPLANT
GLIDEWIRE ADV .035X180CM (WIRE) ×2 IMPLANT
GLOVE BIO SURGEON STRL SZ7 (GLOVE) ×2 IMPLANT
GLOVE SURG SYN 8.0 (GLOVE) ×6 IMPLANT
GLOVE SURG SYN 8.0 PF PI (GLOVE) IMPLANT
GOWN STRL REUS W/ TWL LRG LVL3 (GOWN DISPOSABLE) IMPLANT
GOWN STRL REUS W/ TWL XL LVL3 (GOWN DISPOSABLE) IMPLANT
GOWN STRL REUS W/TWL LRG LVL3 (GOWN DISPOSABLE) ×6
GOWN STRL REUS W/TWL XL LVL3 (GOWN DISPOSABLE) ×3
GUIDEWIRE LT ZIPWIRE 035X260 (WIRE) ×2 IMPLANT
GUIDEWIRE SUPER STIFF .035X180 (WIRE) ×2 IMPLANT
IV NS 500ML (IV SOLUTION) ×3
IV NS 500ML BAXH (IV SOLUTION) IMPLANT
KIT CAROTID MANIFOLD (MISCELLANEOUS) ×2 IMPLANT
LOOP RED MAXI  1X406MM (MISCELLANEOUS) ×2
LOOP VESSEL MAXI 1X406 RED (MISCELLANEOUS) IMPLANT
LOOP VESSEL MINI 0.8X406 BLUE (MISCELLANEOUS) IMPLANT
LOOPS BLUE MINI 0.8X406MM (MISCELLANEOUS) ×2
NDL ENTRY 21GA 7CM ECHOTIP (NEEDLE) IMPLANT
NEEDLE ENTRY 21GA 7CM ECHOTIP (NEEDLE) ×3 IMPLANT
PACK ANGIOGRAPHY (CUSTOM PROCEDURE TRAY) ×2 IMPLANT
PACK EXTREMITY ARMC (MISCELLANEOUS) ×2 IMPLANT
SET INTRO CAPELLA COAXIAL (SET/KITS/TRAYS/PACK) ×2 IMPLANT
SHEATH BRITE TIP 5FRX11 (SHEATH) ×2 IMPLANT
SHEATH BRITE TIP 6FRX11 (SHEATH) ×2 IMPLANT
SHEATH SHUTTLE 7FR (SHEATH) ×2 IMPLANT
SHIELD RADPAD SCOOP 12X17 (MISCELLANEOUS) ×2 IMPLANT
STENT HERCULINK RX 7.0X18X135 (Permanent Stent) ×2 IMPLANT
SUT MNCRL+ 5-0 UNDYED PC-3 (SUTURE) IMPLANT
SUT MONOCRYL 5-0 (SUTURE) ×2
SUT PROLENE 6 0 BV (SUTURE) ×4 IMPLANT
SUT SILK 2 0 (SUTURE) ×3
SUT SILK 2-0 18XBRD TIE 12 (SUTURE) IMPLANT
SUT SILK 3 0 (SUTURE) ×3
SUT SILK 3-0 18XBRD TIE 12 (SUTURE) IMPLANT
SUT SILK 4 0 (SUTURE) ×3
SUT SILK 4-0 18XBRD TIE 12 (SUTURE) IMPLANT
SUT VIC AB 3-0 SH 27 (SUTURE) ×3
SUT VIC AB 3-0 SH 27X BRD (SUTURE) IMPLANT
SYR 20CC LL (SYRINGE) ×2 IMPLANT
WIRE G VAS 035X260 STIFF (WIRE) ×2 IMPLANT
WIRE J 3MM .035X145CM (WIRE) ×2 IMPLANT
WIRE SPARTACORE .014X190CM (WIRE) ×2 IMPLANT

## 2017-06-15 NOTE — Op Note (Signed)
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   Ultrasound guidance for vascular access right femoral artery 2.   Catheter placement into the left common carotid artery from the right femoral approach 3.   Right brachial artery cutdown 4.   Placement of catheter into the aorta from right brachial approach  5.   Aortogram from right brachial approach as well as from right femoral approach 6.   Stent placement to the innominate artery with 7 mm diameter by 18 mm length balloon expandable Herculink stent 7.   StarClose closure device right femoral artery  SURGEON: Leotis Pain, MD and Hortencia Pilar, MD -co-surgeons  ASSISTANT(S): none  ANESTHESIA: general  ESTIMATED BLOOD LOSS: 50  FINDING(S): 1.  High-grade near occlusive stenosis of the innominate artery.  Mild stenosis of the left common carotid artery with a near bovine origin.  High-grade stenosis of the left subclavian artery of about 90%.   INDICATIONS:   Misty Page is a 77 y.o. female who presents with symptomatic innominate artery stenosis with multiple right hemispheric TIAs.  She was found to have innominate artery stenosis and percutaneous intervention is planned.  She has already had a left carotid endarterectomy previously.  She has left subclavian artery stenosis as well.  She has mild to moderate stenosis of the internal carotid artery in the cervical portion, but the high-grade possibly occluded innominate artery was more likely the cause of her symptoms so intervention for this is indicated.  Risks and benefits were discussed and informed consent was obtained.  DESCRIPTION: After obtaining full informed written consent, the  patient was brought back to the operating room and placed supine upon the operating table.  The patient was prepped and draped in the standard fashion.  With Dr. Delana Meyer gaining access through the right femoral artery with ultrasound, I started by making a small cutdown overlying the right brachial artery.  He gained ultrasound access and placed a 6 French sheath in the right femoral artery and then advanced a pigtail catheter into the ascending aorta.  The brachial artery was dissected out and encircled with Vesseloops proximally and distally and the patient was given 8000 units of heparin.  I gained access to the right brachial artery with a micropuncture needle and a micropuncture wire and sheath were then placed upsizing to a 5 French sheath in the right brachial artery.  Initially, imaging was performed from the a sending aorta pigtail catheter placed through the femoral artery to demonstrate near complete occlusion of the innominate artery with a close proximity of a mild to moderately diseased left common carotid artery origin as well the left subclavian artery was a couple of centimeters more distal and had about a 90% stenosis. Cross the innominate lesion from the femoral access were unsuccessful, and I then across the innominate lesion from the right brachial approach with an advantage wire and a Kumpe catheter.  Intraluminal flow was confirmed in the a sending aorta with.  With the close proximity of the left common carotid artery origin, this was cannulated with the Novinger catheter from the right femoral approach and advanced into the mid left common carotid artery.  A 7 French sheath was then placed in the right femoral approach into the transverse aorta and imaging  was performed through the sheath. After splaying open the origins of the innominate artery and left common carotid artery, it was felt we could safely deploy the stent without Impinging on the origin of the left common carotid artery.   Cannulating this artery was very helpful in ensuring his safety. We then advanced a 7 mm diameter by 18 mm length Herculink stent through the right brachial access over a 0.014 wire.  This was parked across the innominate artery stenosis with the stent going about 1 mm into the thoracic aorta.  Care was taken to avoid the bifurcation of the innominate artery so as to not impinge on the flow of the carotid artery.  The balloon expandable stent was then inflated to 10 atm as imaging from the right femoral sheath provided excellent roadmap.  The lesion was encompassed and resolved with inflation.  Completion angiogram following this showed only about a 20% residual stenosis. To confirm normal flow, arterial line was measured from the 7 French sheath in the aorta with a systolic blood pressure of 115 mmHg.  It was then measured off the 5 French sheath in the right brachial artery was found to be 569 mmHg systolic. At this point, we elected to terminate the procedure.  Imaging was performed in the right femoral artery and a StarClose closure device was deployed in the usual fashion with excellent hemostatic result. The right brachial artery was then repaired with 3 interrupted 6-0 Prolene sutures.  The right brachial incision was then closed with a 3-0 Vicryl, 4-0 Monocryl, and Dermabond was placed as a dressing. At this point, we elected to complete the procedure.  The patient was taken to the recovery room in stable condition.   COMPLICATIONS: None  CONDITION: Stable  Leotis Pain  06/15/2017, 10:25 AM    This note was created with Dragon Medical transcription system. Any errors in dictation are purely unintentional.

## 2017-06-15 NOTE — H&P (Signed)
[facility] History & Physical Update  The patient was interviewed and re-examined.  The patient's previous History and Physical has been reviewed and is unchanged.  There is no change in the plan of care. We plan to proceed with the scheduled procedure.  Hortencia Pilar, MD  06/15/2017, 8:27 AM

## 2017-06-15 NOTE — Anesthesia Post-op Follow-up Note (Signed)
Anesthesia QCDR form completed.        

## 2017-06-15 NOTE — Op Note (Signed)
[facility] Percutaneous Study/Intervention Procedural Note   Date: 04/20/2016  Co-Surgeon(s): Hortencia Pilar, MD and Leotis Pain, MD  Assistants: none  Pre-operative Diagnosis: 1.  Symptomatic innominate artery stenosis 2.  Multiple TIAs right hemispheric 3.  90% stenosis at the origin of the left subclavian 4.  Moderate right internal carotid artery stenosis   Post-operative diagnosis: Same  Procedure(s) Performed: 1. Ultrasound guidance for vascular access right femoral artery femoral artery 2. Catheter placement into left carotid artery from right femoral approach 3. Aortogram right femoral approach  4.  Arterial cutdown right brachial artery 5. Introduction catheter into aorta right brachial approach with aortogram from the right brachial approach 6. Stent to the left subclavian with 7 mm diameter x 18 mm length balloon expandable stent, Herculink stent             7.  StarClose closure device right femoral artery   Anesthesia: General by endotracheal intubation   Contrast: 50 cc  Fluoro time: 10.6 minutes   Indications: Patient is a 76y.o. woman who has symptoms secondary to her innominate stenosis.  She also notes she has begun having syncopal episodes and is very concerned because one of these occurred while she was driving. The patient is brought in for angiography for further evaluation and potential treatment. Risks and benefits are discussed and informed consent is obtained  Procedure: The patient was identified and appropriate procedural time out was performed. The patient was then placed supine on the table and prepped and draped in the usual sterile fashion. Ultrasound was used to evaluate the right common femoral artery. It was patent . A digital ultrasound image was acquired. A micropuncture needle was used to access the right common femoral artery under  direct ultrasound guidance and a permanent image was performed. A microwire was then advanced easily under fluoroscopic guidance and a micro-sheath inserted. A 0.035 J wire was advanced without resistance and a 5Fr sheath was placed. Pigtail catheter was placed into the ascending aorta and an LAO projection of the arch was performed. This demonstrated a high-grade stenosis at the origin of the innominate artery with moderate stenosis of the left common carotid at its origin and a 90% stenosis at the origin of the left subclavian. The innominate and left carotid are noted to be very close to one another, this is almost a bovine arch anatomy.  The patient was given 6000 units of IV heparin and later in the case an ACT was checked and was noted to be 235 and an additional 2000 units of heparin was given.We upsized to a 7 Fr shuttle sheath 90 cm.  A JB 1 catheter was used to attempt to cannulate the innominate artery.  This was unsuccessful from this approach.  While I was obtaining arterial access Dr. Lucky Cowboy created a right brachial artery cutdown.  Linear incision was made and the dissection carried down through the skin and soft tissues.  Brachial artery was identified and looped proximally and distally.  Needle was introduced under direct vision followed by wire and a 5 [place] sheath.  Now working from the brachial approach wire and Kumpe catheter were negotiated up to the stenosis and subsequently the wire and Kumpe catheter were negotiated through the venotomy stenosis without difficulty.  Given the close proximity's of the origins between the innominate and the left common carotid there was concern that simultaneous angioplasty and a kissing balloon type technique would be required in order to maintain patency of the left common  carotid.  Therefore, I selectively cannulate the left common carotid and the catheter was advanced initially to the mid left common carotid where hand injection contrast was used to  demonstrate the distal left common carotid and bulb.  Having secured access across the common carotid attention was returned to the innominate.  Based on her symptoms and these findings, I elected to treat the innominate to try to improve the patient's clinical course and eliminate her TIAs.  A 7 mm diameter x 18 mm length Herculink stent was advanced from the brachial approach across the lesion multiple magnified views were obtained to position the stent and then the balloon was inflated to 10 atm for one minute.  Completion angiogram demonstrated excellent improvement blood pressure.  In the aorta was then measured through the 7 [place] shuttle sheath and noted to be 831 mmHg systolic.  Blood pressure was then measured through the 5 French brachial sheath and was noted to be 517 mm systolic the gradient had now been eliminated indicating wide patency.  Follow-up imaging demonstrated excellent stent positioning.  The cutdown was then repaired removing the sheath the brachial artery was closed with interrupted 6-0 Prolene sutures was then irrigated and the subcutaneous tissues closed in layers using Vicryl followed by Monocryl for the skin.  The diagnostic catheter was removed. StarClose closure device was deployed in usual fashion with excellent hemostatic result. The patient was taken to the recovery room in stable condition having tolerated the procedure well.     Findings:Aortic arch demonstrates a type II arch with near bovine anatomy. No evidence of ulcerations or other abnormalities of the arch. Great vessels are severely diseased in their origins the innominate and left subclavian which demonstrates a greater than 90% stenosis at its origin the left common carotid demonstrates a 60% stenosis at its origin.. The mid and distal subclavian arteries are all widely patent on the left and the right.  Following angioplasty and stent placement there is now less than 20% residual stenosis of the  innominate with elimination of the pressure gradient between the aorta and the right arm.   Disposition: Patient is status post successful intervention innominate artery. Patient was taken to the recovery room in stable condition having tolerated the procedure well.  Complications: None  Hortencia Pilar 04/20/2016 10:45 AM  This note was created with Dragon Medical transcription system. Any errors in dictation are purely unintentional.

## 2017-06-15 NOTE — Anesthesia Preprocedure Evaluation (Addendum)
Anesthesia Evaluation  Patient identified by MRN, date of birth, ID band Patient awake    Reviewed: Allergy & Precautions, NPO status , Patient's Chart, lab work & pertinent test results, reviewed documented beta blocker date and time   Airway Mallampati: II  TM Distance: >3 FB     Dental  (+) Chipped   Pulmonary former smoker,           Cardiovascular hypertension, Pt. on medications + CAD, + Past MI and + Peripheral Vascular Disease  + Valvular Problems/Murmurs      Neuro/Psych TIA   GI/Hepatic PUD, GERD  Controlled,  Endo/Other  Hyperthyroidism   Renal/GU Renal disease     Musculoskeletal  (+) Arthritis ,   Abdominal   Peds  Hematology  (+) anemia ,   Anesthesia Other Findings Multiple stents.  Reproductive/Obstetrics                            Anesthesia Physical Anesthesia Plan  ASA: III  Anesthesia Plan: General   Post-op Pain Management:    Induction: Intravenous  PONV Risk Score and Plan:   Airway Management Planned: Oral ETT  Additional Equipment:   Intra-op Plan:   Post-operative Plan:   Informed Consent: I have reviewed the patients History and Physical, chart, labs and discussed the procedure including the risks, benefits and alternatives for the proposed anesthesia with the patient or authorized representative who has indicated his/her understanding and acceptance.     Plan Discussed with: CRNA  Anesthesia Plan Comments:         Anesthesia Quick Evaluation

## 2017-06-15 NOTE — Transfer of Care (Signed)
Immediate Anesthesia Transfer of Care Note  Patient: Misty Page  Procedure(s) Performed: CAROTID PTA/STENT INTERVENTION (N/A )  Patient Location: PACU  Anesthesia Type:General  Level of Consciousness: sedated  Airway & Oxygen Therapy: Patient Spontanous Breathing and Patient connected to face mask oxygen  Post-op Assessment: Report given to RN and Post -op Vital signs reviewed and stable  Post vital signs: Reviewed and stable  Last Vitals:  Vitals:   06/15/17 0949  BP: (!) 151/75  Pulse: 80  Resp: 12  Temp: (!) 36.2 C  SpO2: 100%    Last Pain: There were no vitals filed for this visit.       Complications: No apparent anesthesia complications

## 2017-06-15 NOTE — Anesthesia Postprocedure Evaluation (Signed)
Anesthesia Post Note  Patient: Misty Page  Procedure(s) Performed: CAROTID PTA/STENT INTERVENTION (N/A )  Patient location during evaluation: Cath Lab Anesthesia Type: General Level of consciousness: awake and alert Pain management: pain level controlled Vital Signs Assessment: post-procedure vital signs reviewed and stable Respiratory status: spontaneous breathing, nonlabored ventilation, respiratory function stable and patient connected to nasal cannula oxygen Cardiovascular status: blood pressure returned to baseline and stable Postop Assessment: no apparent nausea or vomiting Anesthetic complications: no     Last Vitals:  Vitals:   06/15/17 1138 06/15/17 1200  BP: 136/77 138/69  Pulse: 90 86  Resp: 14 14  Temp: 36.8 C   SpO2: 93% 92%    Last Pain:  Vitals:   06/15/17 1200  TempSrc:   PainSc: 0-No pain                 Cally Nygard S

## 2017-06-15 NOTE — Anesthesia Procedure Notes (Signed)
Procedure Name: Intubation Date/Time: 06/15/2017 8:26 AM Performed by: Nelda Marseille, CRNA Pre-anesthesia Checklist: Patient identified, Patient being monitored, Timeout performed, Emergency Drugs available and Suction available Patient Re-evaluated:Patient Re-evaluated prior to induction Oxygen Delivery Method: Circle system utilized Preoxygenation: Pre-oxygenation with 100% oxygen Induction Type: IV induction Ventilation: Mask ventilation without difficulty Laryngoscope Size: Mac and 3 Grade View: Grade I Tube type: Oral Tube size: 6.5 mm Number of attempts: 1 Airway Equipment and Method: Stylet and Video-laryngoscopy Placement Confirmation: ETT inserted through vocal cords under direct vision,  positive ETCO2 and breath sounds checked- equal and bilateral Secured at: 21 cm Tube secured with: Tape Dental Injury: Teeth and Oropharynx as per pre-operative assessment  Difficulty Due To: Difficulty was unanticipated and Difficult Airway- due to anterior larynx

## 2017-06-15 NOTE — Progress Notes (Signed)
PHARMACIST - PHYSICIAN ORDER COMMUNICATION  CONCERNING: P&T Medication Policy on Herbal Medications  DESCRIPTION:  This patient's order for:  Co-Q10 and Cranberry has been noted.  This product(s) is classified as an "herbal" or natural product. Due to a lack of definitive safety studies or FDA approval, nonstandard manufacturing practices, plus the potential risk of unknown drug-drug interactions while on inpatient medications, the Pharmacy and Therapeutics Committee does not permit the use of "herbal" or natural products of this type within [facility].   ACTION TAKEN: The pharmacy department is unable to verify this order at this time and your patient has been informed of this safety policy. Please reevaluate patient's clinical condition at discharge and address if the herbal or natural product(s) should be resumed at that time.

## 2017-06-15 NOTE — Progress Notes (Signed)
Over the last hour, the patient had oozing blood from the femoral incisional access. I assessed it as a level 1 and close to a level 2 PAD assessment.  I cleaned her twice in the course of an hour and chose not to initiate the deflation of the PAD until the oozing had stopped. At the end of the hour I then chose to apply pressure 1-2cm above the site for 20 minutes as per the nursing procedure guide for this post op incision and PAD.   I reassessed thirty minutes later and saw no further evidence of oozing blood at this time. Therefore I have begun the deflation and re-inflation process.

## 2017-06-16 DIAGNOSIS — I771 Stricture of artery: Secondary | ICD-10-CM | POA: Diagnosis not present

## 2017-06-16 LAB — BASIC METABOLIC PANEL
Anion gap: 9 (ref 5–15)
BUN: 11 mg/dL (ref 6–20)
CHLORIDE: 104 mmol/L (ref 101–111)
CO2: 24 mmol/L (ref 22–32)
Calcium: 8.1 mg/dL — ABNORMAL LOW (ref 8.9–10.3)
Creatinine, Ser: 0.67 mg/dL (ref 0.44–1.00)
GFR calc Af Amer: >60 mL/min (ref >60)
GLUCOSE: 145 mg/dL — AB (ref 65–99)
POTASSIUM: 3.4 mmol/L — AB (ref 3.5–5.1)
Sodium: 137 mmol/L (ref 135–145)

## 2017-06-16 LAB — CBC
HCT: 35.7 % (ref 35.0–47.0)
Hemoglobin: 12.1 g/dL (ref 12.0–16.0)
MCH: 31.9 pg (ref 26.0–34.0)
MCHC: 33.8 g/dL (ref 32.0–36.0)
MCV: 94.4 fL (ref 80.0–100.0)
PLATELETS: 272 10*3/uL (ref 150–440)
RBC: 3.79 MIL/uL — AB (ref 3.80–5.20)
RDW: 14.5 % (ref 11.5–14.5)
WBC: 10.6 10*3/uL (ref 3.6–11.0)

## 2017-06-16 MED ORDER — PANTOPRAZOLE SODIUM 40 MG PO TBEC: 40.0000 mg | DELAYED_RELEASE_TABLET | Freq: Every day | ORAL | Status: DC

## 2017-06-16 MED ORDER — CLOPIDOGREL BISULFATE 75 MG PO TABS: 75.0000 mg | ORAL_TABLET | Freq: Every day | ORAL | 3 refills | Status: DC

## 2017-06-16 NOTE — Discharge Instructions (Signed)
Carotid Angioplasty With Stent, Care After These instructions give you information about caring for yourself after your procedure. Your doctor may also give you more specific instructions. Call your doctor if you have any problems or questions after your procedure. Follow these instructions at home: Medicines  Take over-the-counter and prescription medicines only as told by your doctor.  If you were prescribed an antibiotic medicine, take it as told by your doctor. Do not stop taking the antibiotic even if you start to feel better. Caring for Your Cut from Surgery   Keep your cut clean and dry.  Follow instructions from your doctor about how to take care of your cut from surgery (incision). Make sure you: ? Wash your hands with soap and water before you change your bandage (dressing). If you cannot use soap and water, use hand sanitizer. ? Change your bandage as told by your doctor. ? Leave stitches (sutures), skin glue, or skin tape (adhesive) strips in place. They may need to stay in place for 2 weeks or longer. If tape strips get loose and curl up, you may trim the loose edges. Do not remove tape strips completely unless your doctor says it is okay.  Check the area around your cut every day for signs of infection. Check for: ? More redness, swelling, or pain. ? More fluid or blood. ? Warmth. ? Pus or a bad smell. Activity  Return to your normal activities as told by your doctor. Ask your doctor what activities are safe for you.  Do not lift anything that is heavier than 10 lb (4.5 kg) until your doctor says it is okay.  Avoid sexual activity until your doctor says that this is safe for you.  Exercise regularly, as told by your doctor. Ask your doctor what types of exercise are safe for you. Eating and drinking   Follow instructions from your doctor about what you cannot eat or drink.  Drink enough fluid to keep your pee (urine) clear or pale yellow.  Eat a heart-healthy  diet. This should include lots of fresh fruits and vegetables. Meat should be lean cuts. Avoid foods that are: ? High in salt, saturated fat, or sugar. ? Canned or highly processed. ? Fried. Lifestyle  Limit alcohol intake to no more than 1 drink per day for nonpregnant women and 2 drinks per day for men. One drink equals 12 oz of beer, 5 oz of wine, or 1 oz of hard liquor.  Do not use any tobacco products, such as cigarettes, chewing tobacco, or e-cigarettes. If you need help quitting, ask your doctor.  Work with your doctor to keep your blood pressure under control.  Stay at a healthy weight. General instructions  Do not drive or use heavy machinery while taking prescription pain medicine.  Do not take baths, swim, or use a hot tub until your doctor approves.  Tell all doctors who care for you that you have a stent.  Keep all follow-up visits as told by your doctor. This is important. Contact a doctor if:  You have more redness, swelling, or pain around your cut.  You have more fluid or blood coming from your cut.  The area around your cut feels warm to the touch.  You have pus or a bad smell coming from your cut.  You have a lump caused by bleeding under your skin (hematoma), and the lump does not go away after 2 weeks.  You have a fever. Get help right away if:  You have vision changes or loss of vision.  You have numbness or weakness on one side of your body.  You have trouble talking.  You have slurred speech or you cannot speak (aphasia).  You suddenly feel very confused.  You notice a lump caused by bleeding under your skin and the lump is quickly getting larger (expanding).  You suddenly get pain in the area where your stent was placed.  Your cut from surgery starts to bleed and does not stop after you hold pressure on it for 30 minutes. These symptoms may be an emergency. Do not wait to see if the symptoms will go away. Get help right away. Call your  local emergency services (911 in [place] Do not drive yourself to the hospital. This information is not intended to replace advice given to you by your health care provider. Make sure you discuss any questions you have with your health care provider. Document Released: 05/22/2013 Document Revised: 10/23/2015 Document Reviewed: 02/09/2015 Elsevier Interactive Patient Education  [employer].

## 2017-06-16 NOTE — Progress Notes (Signed)
PHARMACIST - PHYSICIAN COMMUNICATION   CONCERNING: IV to Oral Route Change Policy  RECOMMENDATION: This patient is receiving pantoprazole by the intravenous route.  Based on criteria approved by the Pharmacy and Therapeutics Committee, the intravenous medication(s) is/are being converted to the equivalent oral dose form(s).   DESCRIPTION: These criteria include:  The patient is eating (either orally or via tube) and/or has been taking other orally administered medications for a least 24 hours  The patient has no evidence of active gastrointestinal bleeding or impaired GI absorption (gastrectomy, short bowel, patient on TNA or NPO).  If you have questions about this conversion, please contact the Pharmacy Department  []   [phone] )  [facility] [x]   [phone] )  [facility] []   [phone] )  [facility] []   [phone] )  [facility] []   [phone] )  [place], [facility] 06/16/2017 11:41 AM

## 2017-06-16 NOTE — Discharge Summary (Signed)
[facility]    Discharge Summary    Patient ID:  Misty Page MRN: [id number] DOB/AGE: 01-25-1941 77 y.o.  Admit date: 06/15/2017 Discharge date: 06/16/2017 Date of Surgery: 06/15/2017 Surgeon: Surgeon(s): Arabella Revelle, Dolores Lory, MD Lucky Cowboy Erskine Squibb, MD  Admission Diagnosis: Innominate Stent Placement  SymptomaticCarotidInnominate Stenosis  Dr Lucky Cowboy Assisting  [facility] needed  cc: Clois Comber  Discharge Diagnoses:  Innominate Stent Placement  SymptomaticCarotidInnominate Stenosis  Dr Lucky Cowboy Assisting  [facility] needed  cc: Clois Comber  Secondary Diagnoses: Past Medical History:  Diagnosis Date  . Allergy   . Anemia   . Arthritis   . Colitis   . Complication of anesthesia    nausea  . Environmental and seasonal allergies   . GERD (gastroesophageal reflux disease)   . Hyperlipidemia   . Hypertension   . Myocardial infarction ([place])   . Osteoporosis   . Thyroid disease   . TIA (transient ischemic attack)     Procedure(s): CAROTID PTA/STENT INTERVENTION  Discharged Condition: good  HPI:  Right arm pain  Hospital Course:  Misty Page is a 77 y.o. female is S/P Right Procedure(s): CAROTID PTA/STENT INTERVENTION Extubated: POD # 0 Physical exam: 2+ pulse in the right wrist Post-op wounds clean, dry, intact or healing well Pt. Ambulating, voiding and taking PO diet without difficulty. Pt pain controlled with PO pain meds. Labs as below Complications:none  Consults:    Significant Diagnostic Studies: CBC Lab Results  Component Value Date   WBC 10.6 06/16/2017   HGB 12.1 06/16/2017   HCT 35.7 06/16/2017   MCV 94.4 06/16/2017   PLT 272 06/16/2017    BMET    Component Value Date/Time   NA 137 06/16/2017 0628   NA 140 06/28/2016 0909   NA 140 06/22/2014 0417   K 3.4 (L) 06/16/2017 0628   K 3.5 06/22/2014 0417   CL 104 06/16/2017 0628   CL 105 06/22/2014 0417   CO2  24 06/16/2017 0628   CO2 27 06/22/2014 0417   GLUCOSE 145 (H) 06/16/2017 0628   GLUCOSE 92 06/22/2014 0417   BUN 11 06/16/2017 0628   BUN 16 06/28/2016 0909   BUN 9 06/22/2014 0417   CREATININE 0.67 06/16/2017 0628   CREATININE 0.65 06/22/2014 0417   CALCIUM 8.1 (L) 06/16/2017 0628   CALCIUM 7.7 (L) 06/22/2014 0417   GFRNONAA >60 06/16/2017 0628   GFRNONAA >60 06/22/2014 0417   GFRNONAA >60 02/26/2013 1510   GFRAA >60 06/16/2017 0628   GFRAA >60 06/22/2014 0417   GFRAA >60 02/26/2013 1510   COAG Lab Results  Component Value Date   INR 1.0 06/22/2014     Disposition:  Discharge to :Home Discharge Instructions    Call MD for:  redness, tenderness, or signs of infection (pain, swelling, bleeding, redness, odor or green/yellow discharge around incision site)   Complete by:  As directed    Call MD for:  severe or increased pain, loss or decreased feeling  in affected limb(s)   Complete by:  As directed    Call MD for:  temperature >100.5   Complete by:  As directed    Discharge instructions   Complete by:  As directed    OK to shower   Driving Restrictions   Complete by:  As directed    No driving for 1 weeks   Lifting restrictions   Complete by:  As directed    No lifting right arm more than 15 pounds one week   Resume previous diet   Complete by:  As directed      Allergies as of 06/16/2017      Reactions   Naproxen Hives, Shortness Of Breath, Nausea And Vomiting, Swelling   Penicillins Shortness Of Breath, Swelling, Rash   Has patient had a PCN reaction causing immediate rash, facial/tongue/throat swelling, SOB or lightheadedness with hypotension: Yes Has patient had a PCN reaction causing severe rash involving mucus membranes or skin necrosis: Yes Has patient had a PCN reaction that required hospitalization: Yes Has patient had a PCN reaction occurring within the last 10 years: No  If all of the above answers are "NO", then may proceed with Cephalosporin use.    Codeine Nausea And Vomiting   Levofloxacin Nausea And Vomiting   Shellfish Allergy Hives, Diarrhea, Nausea And Vomiting, Swelling      Medication List    TAKE these medications   aspirin 81 MG tablet Take 81 mg by mouth daily. ASPIRIN, 81MG (Oral Tablet)  1 Every Day for 0 days  Quantity: 0.00;  Refills: 0   Ordered :13-Apr-2010  Celene Kras, MA, Anastasiya ;  Started 29-October-2008 Active Comments: DX: 414.00   B COMPLEX VITAMINS PO Take 1 tablet by mouth daily. B COMPLEX (Oral Capsule)  1 Every Day for 0 days  Quantity: 0.00;  Refills: 0   Ordered :13-Apr-2010  Celene Kras, MA, Anastasiya ;  Started 29-October-2008 Active Comments: DX: 414.00   Calcium-Vitamin D-Vitamin K 500-100-40 MG-UNT-MCG Chew Chew 1 tablet by mouth 2 (two) times daily. VIACTIV, 500-100-40 (Oral Tablet Chewable)  1 tablet twice a day for 0 days  Quantity: 0.00;  Refills: 0   Ordered :13-Apr-2010  Celene Kras, MA, Anastasiya ;  Started 29-October-2008 Active Comments: DX: 733.00   CENTRUM SILVER PO Take 1 capsule by mouth daily.   clopidogrel 75 MG tablet Commonly known as:  PLAVIX Take 1 tablet (75 mg total) by mouth daily. Start taking on:  06/17/2017   CoQ10 200 MG Caps Take 1 capsule by mouth at bedtime.   Cranberry 300 MG tablet Take 300 mg by mouth daily.   famotidine 40 MG tablet Commonly known as:  PEPCID TAKE ONE (1) TABLET BY MOUTH EVERY DAY   ferrous sulfate 325 (65 FE) MG EC tablet Take 325 mg by mouth daily. FERROUS SULFATE, 325 (65 Fe)MG (Oral Tablet Delayed Release)  1 every day for 0 days  Quantity: 0.00;  Refills: 0   Ordered :13-Apr-2010  Celene Kras, MA, Anastasiya ;  Started 29-October-2008 Active Comments: DX: 285.9   fexofenadine 180 MG tablet Commonly known as:  ALLEGRA Take 180 mg by mouth at bedtime.   Fish Oil 1000 MG Caps Take 1 capsule by mouth daily. FISH OIL CONCENTRATE, 1000MG (Oral Capsule)  1 Every Day for 0 days  Quantity: 0.00;  Refills: 0   Ordered :13-Apr-2010  Karoline Caldwell, Anastasiya ;  Started 29-October-2008 Active Comments: DX: 414.00   FLUARIX QUADRIVALENT 0.5 ML injection Generic drug:  Influenza vac split quadrivalent PF   fluticasone 50 MCG/ACT nasal spray Commonly known as:  FLONASE USE 2 SPRAYS EACH NOSTRIL DAILY   hydrochlorothiazide 25 MG tablet Commonly known as:  HYDRODIURIL TAKE ONE (1) TABLET EACH DAY   losartan 100 MG tablet Commonly known as:  COZAAR TAKE ONE (1) TABLET EACH DAY   Mesalamine 400 MG Cpdr DR capsule Commonly known as:  ASACOL Take 2 capsules (800 mg total)  by mouth 2 (two) times daily.   methimazole 5 MG tablet Commonly known as:  TAPAZOLE Take 2.5 mg by mouth.   montelukast 10 MG tablet Commonly known as:  SINGULAIR TAKE ONE (1) TABLET AT BEDTIME   MULTIPLE VITAMIN PO Take 1 tablet by mouth daily. MULTIVITAMINS (Oral Tablet)  1 Every Day for 0 days  Quantity: 0.00;  Refills: 0   Ordered :13-Apr-2010  Karoline Caldwell, Anastasiya ;  Started 29-October-2008 Active Comments: DX: 414.00   nitrofurantoin (macrocrystal-monohydrate) 100 MG capsule Commonly known as:  MACROBID Take 1 capsule (100 mg total) 2 (two) times daily by mouth.   rosuvastatin 20 MG tablet Commonly known as:  CRESTOR Take 0.5 tablets (10 mg total) by mouth daily. What changed:  when to take this   terbinafine 250 MG tablet Commonly known as:  LAMISIL Take by mouth.   triamcinolone cream 0.1 % Commonly known as:  KENALOG   vitamin C 1000 MG tablet Take 500 mg by mouth daily.   vitamin E 400 UNIT capsule Take 400 Units by mouth daily.      Verbal and written Discharge instructions given to the patient. Wound care per Discharge AVS Follow-up Information    Monserratt Knezevic, Dolores Lory, MD Follow up in 10 day(s).   Specialties:  Vascular Surgery, Cardiology, Radiology, Vascular Surgery Why:  with carotid duplex Contact information: [place] [address] [phone]        Makenlee Mckeag, Dolores Lory, MD Follow up.   Specialties:   Vascular Surgery, Cardiology, Radiology, Vascular Surgery Why:  jan 29 at 08:00am Contact information: [place] [address] [phone]           Signed: Hortencia Pilar, MD  06/16/2017, 5:26 PM

## 2017-06-17 ENCOUNTER — Encounter: Payer: Self-pay | Admitting: Vascular Surgery

## 2017-06-23 ENCOUNTER — Other Ambulatory Visit: Payer: Self-pay | Admitting: Physician Assistant

## 2017-06-23 ENCOUNTER — Other Ambulatory Visit (INDEPENDENT_AMBULATORY_CARE_PROVIDER_SITE_OTHER): Payer: Self-pay | Admitting: Vascular Surgery

## 2017-06-23 DIAGNOSIS — I6523 Occlusion and stenosis of bilateral carotid arteries: Secondary | ICD-10-CM

## 2017-06-23 DIAGNOSIS — K219 Gastro-esophageal reflux disease without esophagitis: Secondary | ICD-10-CM

## 2017-06-24 ENCOUNTER — Telehealth (INDEPENDENT_AMBULATORY_CARE_PROVIDER_SITE_OTHER): Payer: Self-pay

## 2017-06-24 NOTE — Telephone Encounter (Signed)
Patient called stating she had a carotid stent placement on 06/15/17 with Dr. Delana Meyer and now the arm that was used has a rash on it now. She has used some cream for the itching but was wondering if there is something else she could do. Per the PA Hezzie Bump the patient can try using Benadryl as well to see if it helps with the itching. I did let her know that as long as she isn't having any other symptoms like trouble breathing she will do fine until her appt on 06/28/17 with Hezzie Bump. Patient was in agreement with this.

## 2017-06-27 DIAGNOSIS — Z8719 Personal history of other diseases of the digestive system: Secondary | ICD-10-CM | POA: Diagnosis not present

## 2017-06-27 DIAGNOSIS — Z8639 Personal history of other endocrine, nutritional and metabolic disease: Secondary | ICD-10-CM | POA: Diagnosis not present

## 2017-06-27 DIAGNOSIS — R197 Diarrhea, unspecified: Secondary | ICD-10-CM | POA: Diagnosis not present

## 2017-06-27 DIAGNOSIS — K625 Hemorrhage of anus and rectum: Secondary | ICD-10-CM | POA: Diagnosis not present

## 2017-06-28 ENCOUNTER — Encounter (INDEPENDENT_AMBULATORY_CARE_PROVIDER_SITE_OTHER): Payer: Self-pay | Admitting: Vascular Surgery

## 2017-06-28 ENCOUNTER — Other Ambulatory Visit
Admission: RE | Admit: 2017-06-28 | Discharge: 2017-06-28 | Disposition: A | Discharge status: Home / Self Care | Payer: PPO | Source: Ambulatory Visit | Attending: Gastroenterology | Admitting: Gastroenterology

## 2017-06-28 ENCOUNTER — Ambulatory Visit (INDEPENDENT_AMBULATORY_CARE_PROVIDER_SITE_OTHER): Payer: PPO

## 2017-06-28 ENCOUNTER — Ambulatory Visit (INDEPENDENT_AMBULATORY_CARE_PROVIDER_SITE_OTHER): Payer: PPO | Admitting: Vascular Surgery

## 2017-06-28 VITALS — BP 176/86 | HR 75 | Resp 16 | Ht 61.0 in | Wt 136.0 lb

## 2017-06-28 DIAGNOSIS — E7800 Pure hypercholesterolemia, unspecified: Secondary | ICD-10-CM

## 2017-06-28 DIAGNOSIS — I771 Stricture of artery: Secondary | ICD-10-CM

## 2017-06-28 DIAGNOSIS — K625 Hemorrhage of anus and rectum: Secondary | ICD-10-CM | POA: Insufficient documentation

## 2017-06-28 DIAGNOSIS — I6523 Occlusion and stenosis of bilateral carotid arteries: Secondary | ICD-10-CM

## 2017-06-28 DIAGNOSIS — R197 Diarrhea, unspecified: Secondary | ICD-10-CM | POA: Insufficient documentation

## 2017-06-28 DIAGNOSIS — Z8719 Personal history of other diseases of the digestive system: Secondary | ICD-10-CM | POA: Insufficient documentation

## 2017-06-28 LAB — GASTROINTESTINAL PANEL BY PCR, STOOL (REPLACES STOOL CULTURE)
Adenovirus F40/41: NOT DETECTED
Astrovirus: NOT DETECTED
CRYPTOSPORIDIUM: NOT DETECTED
CYCLOSPORA CAYETANENSIS: NOT DETECTED
Campylobacter species: NOT DETECTED
Entamoeba histolytica: NOT DETECTED
Enteroaggregative E coli (EAEC): NOT DETECTED
Enteropathogenic E coli (EPEC): NOT DETECTED
Enterotoxigenic E coli (ETEC): NOT DETECTED
Giardia lamblia: NOT DETECTED
Norovirus GI/GII: NOT DETECTED
Plesimonas shigelloides: NOT DETECTED
Rotavirus A: NOT DETECTED
SAPOVIRUS (I, II, IV, AND V): NOT DETECTED
SHIGA LIKE TOXIN PRODUCING E COLI (STEC): NOT DETECTED
Salmonella species: NOT DETECTED
Shigella/Enteroinvasive E coli (EIEC): NOT DETECTED
VIBRIO SPECIES: NOT DETECTED
Vibrio cholerae: NOT DETECTED
YERSINIA ENTEROCOLITICA: NOT DETECTED

## 2017-06-28 LAB — C DIFFICILE QUICK SCREEN W PCR REFLEX
C DIFFICILE (CDIFF) INTERP: NOT DETECTED
C Diff antigen: NEGATIVE
C Diff toxin: NEGATIVE

## 2017-06-28 NOTE — Progress Notes (Signed)
Subjective:    Patient ID: Misty Page, female    DOB: 12-02-1940, 77 y.o.   MRN: [id number] Chief Complaint  Patient presents with  . Carotid    10 day carotid check   Patient presents for her first post procedure follow-up.  The patient's postoperative course was unremarkable with the exception of a rash that she developed along her right arm around the cutdown site.  The patient had called the office and was encouraged to take some Benadryl.  The patient reports improvement in the rash.  The patient is otherwise without complaint. The patient denies experiencing Amaurosis Fugax, TIA like symptoms or focal motor deficits.  The patient underwent a bilateral carotid artery duplex exam which was notable for patent bilateral carotid endarterectomy sites without evidence of significant restenosis.  The right innominate stent is patent without evidence of increased velocities.  Normal bilateral vertebral flow.  When compared to the previous examination on February 07, 2017 the right subclavian waveform is now normal.  Left subclavian waveform is also normal.  Patient denies any fever, nausea vomiting.   Review of Systems  Constitutional: Negative.   HENT: Negative.   Eyes: Negative.   Respiratory: Negative.   Cardiovascular: Negative.   Gastrointestinal: Negative.   Endocrine: Negative.   Genitourinary: Negative.   Musculoskeletal: Negative.   Skin: Negative.   Allergic/Immunologic: Negative.   Neurological: Negative.   Hematological: Negative.   Psychiatric/Behavioral: Negative.       Objective:   Physical Exam  Constitutional: She is oriented to person, place, and time. She appears well-developed and well-nourished. No distress.  HENT:  Head: Normocephalic and atraumatic.  Eyes: Conjunctivae are normal. Pupils are equal, round, and reactive to light.  Neck: Normal range of motion.  No carotid bruits auscultated on exam  Cardiovascular: Normal rate, regular rhythm, normal heart  sounds and intact distal pulses.  Pulses:      Radial pulses are 2+ on the right side, and 2+ on the left side.  There is a 2 cm x 2 cm raised area just proximal to the brachial area cut down.  Incision has healed well.  I do not auscultate any flow in this area.  This area is nontender to palpation.  There is no erythema.  There is no induration  Pulmonary/Chest: Effort normal and breath sounds normal.  Musculoskeletal: Normal range of motion. She exhibits no edema.  Neurological: She is alert and oriented to person, place, and time.  Skin: Skin is warm and dry. She is not diaphoretic.  Psychiatric: She has a normal mood and affect. Her behavior is normal. Judgment and thought content normal.  Vitals reviewed.  BP (!) 176/86 (BP Location: Right Arm, Patient Position: Sitting)   Pulse 75   Resp 16   Ht 5' 1"  (1.549 m)   Wt 136 lb (61.7 kg)   BMI 25.70 kg/m   Past Medical History:  Diagnosis Date  . Allergy   . Anemia   . Arthritis   . Colitis   . Complication of anesthesia    nausea  . Environmental and seasonal allergies   . GERD (gastroesophageal reflux disease)   . Hyperlipidemia   . Hypertension   . Myocardial infarction ([place])   . Osteoporosis   . Thyroid disease   . TIA (transient ischemic attack)    Social History   Socioeconomic History  . Marital status: Married    Spouse name: Misty Page  . Number of children: 1  . Years of  education: Not on file  . Highest education level: Not on file  Social Needs  . Financial resource strain: Not on file  . Food insecurity - worry: Not on file  . Food insecurity - inability: Not on file  . Transportation needs - medical: Not on file  . Transportation needs - non-medical: Not on file  Occupational History  . Not on file  Tobacco Use  . Smoking status: Former Smoker    Last attempt to quit: 05/30/1994    Years since quitting: 23.0  . Smokeless tobacco: Never Used  Substance and Sexual Activity  . Alcohol use: Yes     Comment: rare  . Drug use: No  . Sexual activity: No  Other Topics Concern  . Not on file  Social History Narrative  . Not on file   Past Surgical History:  Procedure Laterality Date  . ABDOMINAL HYSTERECTOMY  2007  . BREAST ENHANCEMENT SURGERY  2004  . BREAST SURGERY  1984   Fibrocystic Disease  . CAROTID ENDARTERECTOMY Left 06/21/2014   Dr. Delana Meyer  . CAROTID ENDARTERECTOMY Right 08/02/2014   Dr. Delana Meyer  . CAROTID PTA/STENT INTERVENTION N/A 06/15/2017   Procedure: CAROTID PTA/STENT INTERVENTION;  Surgeon: Katha Cabal, MD;  Location: [facility];  Service: Cardiovascular;  Laterality: N/A;  . COLONOSCOPY WITH PROPOFOL N/A 07/14/2015   Procedure: COLONOSCOPY WITH PROPOFOL;  Surgeon: Hulen Luster, MD;  Location: [facility];  Service: Gastroenterology;  Laterality: N/A;  . CORONARY ANGIOPLASTY WITH STENT PLACEMENT  1999  . FEMORAL ARTERY STENT  08/2003  . HERNIA REPAIR  2008  . RENAL ARTERY STENT  08/2003   Family History  Problem Relation Age of Onset  . Hyperlipidemia Mother   . Congestive Heart Failure Mother   . Pancreatic cancer Father   . Arthritis Sister   . Alzheimer's disease Sister   . Prostate cancer Brother   . Heart attack Brother   . Stomach cancer Brother   . Kidney failure Brother   . Leukemia Brother   . Emphysema Brother    Allergies  Allergen Reactions  . Naproxen Hives, Shortness Of Breath, Nausea And Vomiting and Swelling  . Penicillins Shortness Of Breath, Swelling and Rash    Has patient had a PCN reaction causing immediate rash, facial/tongue/throat swelling, SOB or lightheadedness with hypotension: Yes Has patient had a PCN reaction causing severe rash involving mucus membranes or skin necrosis: Yes Has patient had a PCN reaction that required hospitalization: Yes Has patient had a PCN reaction occurring within the last 10 years: No  If all of the above answers are "NO", then may proceed with Cephalosporin use.   . Codeine  Nausea And Vomiting  . Levofloxacin Nausea And Vomiting  . Shellfish Allergy Hives, Diarrhea, Nausea And Vomiting and Swelling      Assessment & Plan:  Patient presents for her first post procedure follow-up.  The patient's postoperative course was unremarkable with the exception of a rash that she developed along her right arm around the cutdown site.  The patient had called the office and was encouraged to take some Benadryl.  The patient reports improvement in the rash.  The patient is otherwise without complaint. The patient denies experiencing Amaurosis Fugax, TIA like symptoms or focal motor deficits.  The patient underwent a bilateral carotid artery duplex exam which was notable for patent bilateral carotid endarterectomy sites without evidence of significant restenosis.  The right innominate stent is patent without evidence of increased velocities.  Normal bilateral vertebral flow.  When compared to the previous examination on February 07, 2017 the right subclavian waveform is now normal.  Left subclavian waveform is also normal.  Patient denies any fever, nausea vomiting.  1. Innominate artery stenosis (HCC) - Stable Stent placement in the right innominate artery now yields a improvement in stenosis. Patent stent on today's duplex The patient is to follow-up in 3 months and undergo another carotid artery ultrasound to assess the area to assure continued patency of the stent Patient is to continue taking Plavix for approximately 3 months The patient does have colitis and was found to have some blood in her stool.  She is currently undergoing a workup by her gastroenterologist.  I asked the patient to please keep her office and informed and if she should become anemic or have increased blood in her stool may have to consider stopping the Plavix She and her daughter who was with her during this examination are in agreement  - VAS US CAROTID; Future  2. Bilateral carotid artery stenosis -  Stable Studies reviewed with patient. Patient asymptomatic with stable duplex.  No intervention at this time.  Patient to return in three months for surveillance carotid duplex. Patient to continue medical optimization with ASA, plavix and dyslipidemia medication. Patient to remain abstinent of tobacco use. I have discussed with the patient at length the risk factors for and pathogenesis of atherosclerotic disease and encouraged a healthy diet, regular exercise regimen and blood pressure / glucose control.  Patient was instructed to contact our office in the interim with problems such as arm / leg weakness or numbness, speech / swallowing difficulty or temporary monocular blindness. The patient expresses their understanding.   - VAS US CAROTID; Future  3. Hypercholesteremia - Stable Encouraged good control as its slows the progression of atherosclerotic disease  Of note: The raised area by the right upper extremity cutdown site does not not have any auscultated blood flow.  It is hard however nontender to palpation.  There is no erythema, skin is intact, cellulitis, induration.  I have told the patient that if this does not decrease in size over the next few weeks to please call the office and she will need to undergo a right upper extremity ultrasound to assess for a possible pseudoaneurysm.  The patient and her daughter expressed her understanding.  Current Outpatient Medications on File Prior to Visit  Medication Sig Dispense Refill  . Ascorbic Acid (VITAMIN C) 1000 MG tablet Take 500 mg by mouth daily.     Marland Kitchen aspirin 81 MG tablet Take 81 mg by mouth daily. ASPIRIN, 81MG (Oral Tablet)  1 Every Day for 0 days  Quantity: 0.00;  Refills: 0   Ordered :13-Apr-2010  Celene Kras, MA, Anastasiya ;  Started 29-October-2008 Active Comments: DX: 414.00    . B COMPLEX VITAMINS PO Take 1 tablet by mouth daily. B COMPLEX (Oral Capsule)  1 Every Day for 0 days  Quantity: 0.00;  Refills: 0   Ordered :13-Apr-2010   Celene Kras, MA, Anastasiya ;  Started 29-October-2008 Active Comments: DX: 414.00    . Calcium-Vitamin D-Vitamin K 500-100-40 MG-UNT-MCG CHEW Chew 1 tablet by mouth 2 (two) times daily. VIACTIV, 500-100-40 (Oral Tablet Chewable)  1 tablet twice a day for 0 days  Quantity: 0.00;  Refills: 0   Ordered :13-Apr-2010  Celene Kras, MA, Anastasiya ;  Started 29-October-2008 Active Comments: DX: 733.00    . clopidogrel (PLAVIX) 75 MG tablet Take 1 tablet (75 mg total) by  mouth daily. 30 tablet 3  . Coenzyme Q10 (COQ10) 200 MG CAPS Take 1 capsule by mouth at bedtime.    . Cranberry 300 MG tablet Take 300 mg by mouth daily.     . famotidine (PEPCID) 40 MG tablet TAKE ONE (1) TABLET BY MOUTH EVERY DAY 90 tablet 1  . ferrous sulfate 325 (65 FE) MG EC tablet Take 325 mg by mouth daily. FERROUS SULFATE, 325 (65 Fe)MG (Oral Tablet Delayed Release)  1 every day for 0 days  Quantity: 0.00;  Refills: 0   Ordered :13-Apr-2010  Celene Kras, MA, Anastasiya ;  Started 29-October-2008 Active Comments: DX: 285.9    . fexofenadine (ALLEGRA) 180 MG tablet Take 180 mg by mouth at bedtime.     Marland Kitchen FLUARIX QUADRIVALENT 0.5 ML injection     . fluticasone (FLONASE) 50 MCG/ACT nasal spray USE 2 SPRAYS EACH NOSTRIL DAILY 48 g 1  . hydrochlorothiazide (HYDRODIURIL) 25 MG tablet TAKE ONE (1) TABLET EACH DAY 90 tablet 3  . losartan (COZAAR) 100 MG tablet TAKE ONE (1) TABLET EACH DAY 90 tablet 1  . Mesalamine (ASACOL) 400 MG CPDR DR capsule Take 2 capsules (800 mg total) by mouth 2 (two) times daily. 120 capsule 0  . methimazole (TAPAZOLE) 5 MG tablet Take 2.5 mg by mouth.     . montelukast (SINGULAIR) 10 MG tablet TAKE ONE (1) TABLET AT BEDTIME 90 tablet 3  . MULTIPLE VITAMIN PO Take 1 tablet by mouth daily. MULTIVITAMINS (Oral Tablet)  1 Every Day for 0 days  Quantity: 0.00;  Refills: 0   Ordered :13-Apr-2010  Celene Kras, MA, Anastasiya ;  Started 29-October-2008 Active Comments: DX: 414.00    . Multiple Vitamins-Minerals (CENTRUM SILVER PO)  Take 1 capsule by mouth daily.    . nitrofurantoin, macrocrystal-monohydrate, (MACROBID) 100 MG capsule Take 1 capsule (100 mg total) 2 (two) times daily by mouth. 14 capsule 0  . Omega-3 Fatty Acids (FISH OIL) 1000 MG CAPS Take 1 capsule by mouth daily. FISH OIL CONCENTRATE, 1000MG (Oral Capsule)  1 Every Day for 0 days  Quantity: 0.00;  Refills: 0   Ordered :13-Apr-2010  Celene Kras, MA, Anastasiya ;  Started 29-October-2008 Active Comments: DX: 414.00    . rosuvastatin (CRESTOR) 20 MG tablet Take 0.5 tablets (10 mg total) by mouth daily. (Patient taking differently: Take 10 mg by mouth at bedtime. ) 45 tablet 3  . terbinafine (LAMISIL) 250 MG tablet Take by mouth.    . triamcinolone cream (KENALOG) 0.1 %     . vitamin E 400 UNIT capsule Take 400 Units by mouth daily.     No current facility-administered medications on file prior to visit.    There are no Patient Instructions on file for this visit. No Follow-up on file.  Amariyon Maynes A Marijean Montanye, [name]

## 2017-07-01 LAB — VAS US CAROTID
LCCADSYS: -58 cm/s
LCCAPSYS: 107 cm/s
LEFT ECA DIAS: -6 cm/s
LICADDIAS: -11 cm/s
Left CCA dist dias: -13 cm/s
Left CCA prox dias: 11 cm/s
Left ICA dist sys: -43 cm/s
Left ICA prox dias: -7 cm/s
Left ICA prox sys: -41 cm/s
RCCADSYS: -98 cm/s
RCCAPDIAS: -18 cm/s
RIGHT CCA MID DIAS: -16 cm/s
Right CCA prox sys: -74 cm/s

## 2017-07-05 DIAGNOSIS — Z8719 Personal history of other diseases of the digestive system: Secondary | ICD-10-CM | POA: Diagnosis not present

## 2017-07-05 DIAGNOSIS — K625 Hemorrhage of anus and rectum: Secondary | ICD-10-CM | POA: Diagnosis not present

## 2017-07-05 DIAGNOSIS — R197 Diarrhea, unspecified: Secondary | ICD-10-CM | POA: Diagnosis not present

## 2017-07-11 ENCOUNTER — Other Ambulatory Visit: Payer: Self-pay | Admitting: Physician Assistant

## 2017-07-11 ENCOUNTER — Ambulatory Visit: Payer: PPO | Admitting: Family Medicine

## 2017-07-11 DIAGNOSIS — I10 Essential (primary) hypertension: Secondary | ICD-10-CM | POA: Diagnosis not present

## 2017-07-11 DIAGNOSIS — E041 Nontoxic single thyroid nodule: Secondary | ICD-10-CM | POA: Diagnosis not present

## 2017-07-11 DIAGNOSIS — I2510 Atherosclerotic heart disease of native coronary artery without angina pectoris: Secondary | ICD-10-CM | POA: Diagnosis not present

## 2017-07-11 DIAGNOSIS — E0590 Thyrotoxicosis, unspecified without thyrotoxic crisis or storm: Secondary | ICD-10-CM | POA: Diagnosis not present

## 2017-07-11 DIAGNOSIS — I739 Peripheral vascular disease, unspecified: Secondary | ICD-10-CM | POA: Diagnosis not present

## 2017-07-11 DIAGNOSIS — G459 Transient cerebral ischemic attack, unspecified: Secondary | ICD-10-CM | POA: Diagnosis not present

## 2017-07-15 DIAGNOSIS — M7581 Other shoulder lesions, right shoulder: Secondary | ICD-10-CM | POA: Diagnosis not present

## 2017-07-15 DIAGNOSIS — M75111 Incomplete rotator cuff tear or rupture of right shoulder, not specified as traumatic: Secondary | ICD-10-CM | POA: Diagnosis not present

## 2017-07-18 DIAGNOSIS — E042 Nontoxic multinodular goiter: Secondary | ICD-10-CM | POA: Diagnosis not present

## 2017-07-18 DIAGNOSIS — E0590 Thyrotoxicosis, unspecified without thyrotoxic crisis or storm: Secondary | ICD-10-CM | POA: Diagnosis not present

## 2017-07-20 ENCOUNTER — Other Ambulatory Visit: Payer: Self-pay | Admitting: "Endocrinology

## 2017-07-20 DIAGNOSIS — E0590 Thyrotoxicosis, unspecified without thyrotoxic crisis or storm: Secondary | ICD-10-CM

## 2017-07-20 DIAGNOSIS — E042 Nontoxic multinodular goiter: Secondary | ICD-10-CM

## 2017-07-25 ENCOUNTER — Ambulatory Visit: Payer: Self-pay | Admitting: Physician Assistant

## 2017-08-01 ENCOUNTER — Ambulatory Visit: Payer: PPO | Admitting: Family Medicine

## 2017-08-15 ENCOUNTER — Ambulatory Visit (INDEPENDENT_AMBULATORY_CARE_PROVIDER_SITE_OTHER): Payer: PPO | Admitting: Family Medicine

## 2017-08-15 ENCOUNTER — Ambulatory Visit (INDEPENDENT_AMBULATORY_CARE_PROVIDER_SITE_OTHER): Payer: PPO

## 2017-08-15 ENCOUNTER — Encounter: Payer: Self-pay | Admitting: Family Medicine

## 2017-08-15 VITALS — BP 120/50 | HR 96 | Temp 98.7°F | Ht 61.0 in

## 2017-08-15 VITALS — BP 120/60 | HR 70 | Temp 98.7°F | Resp 16 | Ht 61.0 in | Wt 136.0 lb

## 2017-08-15 DIAGNOSIS — Z23 Encounter for immunization: Secondary | ICD-10-CM | POA: Diagnosis not present

## 2017-08-15 DIAGNOSIS — Z0000 Encounter for general adult medical examination without abnormal findings: Secondary | ICD-10-CM

## 2017-08-15 NOTE — Progress Notes (Signed)
Subjective:   Misty Page is a 77 y.o. female who presents for Medicare Annual (Subsequent) preventive examination.  Review of Systems:  N/A  Cardiac Risk Factors include: advanced age (>61mn, >>11women);dyslipidemia;hypertension     Objective:     Vitals: BP (!) 120/50 (BP Location: Left Arm)   Pulse 96   Temp 98.7 F (37.1 C) (Oral)   Ht 5' 1"  (1.549 m)   BMI 25.70 kg/m   Body mass index is 25.7 kg/m.  Advanced Directives 08/15/2017 06/15/2017 06/15/2017 06/08/2017 02/07/2017 12/20/2016 06/21/2016  Does Patient Have a Medical Advance Directive? Yes Yes Yes Yes Yes Yes Yes  Type of AParamedicof [place]iloLiving will Healthcare Power of APoulanof AKaserLiving will HOxon HillLiving will HMeadeLiving will HVails GateLiving will  Does patient want to make changes to medical advance directive? - No - Patient declined - - - - -  Copy of [place] Chart? No - copy requested No - copy requested - - - - -    Tobacco Social History   Tobacco Use  Smoking Status Former Smoker  . Last attempt to quit: 05/30/1994  . Years since quitting: 23.2  Smokeless Tobacco Never Used     Counseling given: Not Answered   Clinical Intake:  Pre-visit preparation completed: Yes  Pain : No/denies pain Pain Score: 0-No pain     Nutritional Status: BMI 25 -29 Overweight Nutritional Risks: Nausea/ vomitting/ diarrhea(diarrhea occasionally due to colitis) Diabetes: No  How often do you need to have someone help you when you read instructions, pamphlets, or other written materials from your doctor or pharmacy?: 1 - Never  Interpreter Needed?: No  Information entered by :: [facility] LPN  Past Medical History:  Diagnosis Date  . Allergy   . Anemia   . Arthritis   . Colitis   . Complication of anesthesia    nausea  . Environmental  and seasonal allergies   . GERD (gastroesophageal reflux disease)   . Hyperlipidemia   . Hypertension   . Myocardial infarction (HBell [place]le   . Osteoporosis   . Thyroid disease   . TIA (transient ischemic attack)    Past Surgical History:  Procedure Laterality Date  . ABDOMINAL HYSTERECTOMY  2007  . BREAST ENHANCEMENT SURGERY  2004  . BREAST SURGERY  1984   Fibrocystic Disease  . CAROTID ENDARTERECTOMY Left 06/21/2014   Dr. SDelana Meyer . CAROTID ENDARTERECTOMY Right 08/02/2014   Dr. SDelana Meyer . CAROTID PTA/STENT INTERVENTION N/A 06/15/2017   Procedure: CAROTID PTA/STENT INTERVENTION;  Surgeon: SKatha Cabal MD;  [facility];  Service: Cardiovascular;  Laterality: N/A;  . COLONOSCOPY WITH PROPOFOL N/A 07/14/2015   Procedure: COLONOSCOPY WITH PROPOFOL;  Surgeon: PHulen Luster MD;  [facility];  Service: Gastroenterology;  Laterality: N/A;  . CORONARY ANGIOPLASTY WITH STENT PLACEMENT  1999  . FEMORAL ARTERY STENT  08/2003  . HERNIA REPAIR  2008  . RENAL ARTERY STENT  08/2003   Family History  Problem Relation Age of Onset  . Hyperlipidemia Mother   . Congestive Heart Failure Mother   . Pancreatic cancer Father   . Arthritis Sister   . Alzheimer's disease Sister   . Prostate cancer Brother   . Heart attack Brother   . Stomach cancer Brother   . Kidney failure Brother   . Leukemia Brother   . Emphysema Brother  Social History   Socioeconomic History  . Marital status: Widowed    Spouse name: Konrad Dolores  . Number of children: 1  . Years of education: None  . Highest education level: 12th grade  Social Needs  . Financial resource strain: Not hard at all  . Food insecurity - worry: Never true  . Food insecurity - inability: Never true  . Transportation needs - medical: No  . Transportation needs - non-medical: No  Occupational History  . Occupation: hair dresser  Tobacco Use  . Smoking status: Former Smoker    Last attempt to quit: 05/30/1994     Years since quitting: 23.2  . Smokeless tobacco: Never Used  Substance and Sexual Activity  . Alcohol use: Yes    Comment: rare  . Drug use: No  . Sexual activity: No  Other Topics Concern  . None  Social History Narrative  . None    Outpatient Encounter Medications as of 08/15/2017  Medication Sig  . Ascorbic Acid (VITAMIN C) 1000 MG tablet Take 500 mg by mouth daily.   Marland Kitchen aspirin 81 MG tablet Take 81 mg by mouth daily. ASPIRIN, 81MG (Oral Tablet)  1 Every Day for 0 days  Quantity: 0.00;  Refills: 0   Ordered :13-Apr-2010  Celene Kras, MA, Anastasiya ;  Started 29-October-2008 Active Comments: DX: 414.00  . B COMPLEX VITAMINS PO Take 1 tablet by mouth daily. B COMPLEX (Oral Capsule)  1 Every Day for 0 days  Quantity: 0.00;  Refills: 0   Ordered :13-Apr-2010  Celene Kras, MA, Anastasiya ;  Started 29-October-2008 Active Comments: DX: 414.00  . Calcium-Vitamin D-Vitamin K 500-100-40 MG-UNT-MCG CHEW Chew 1 tablet by mouth 2 (two) times daily. VIACTIV, 500-100-40 (Oral Tablet Chewable)  1 tablet twice a day for 0 days  Quantity: 0.00;  Refills: 0   Ordered :13-Apr-2010  Celene Kras, MA, Anastasiya ;  Started 29-October-2008 Active Comments: DX: 733.00  . clopidogrel (PLAVIX) 75 MG tablet Take 1 tablet (75 mg total) by mouth daily.  . Coenzyme Q10 (COQ10) 200 MG CAPS Take 1 capsule by mouth at bedtime.  . Cranberry 300 MG tablet Take 300 mg by mouth daily.   . famotidine (PEPCID) 40 MG tablet TAKE ONE (1) TABLET BY MOUTH EVERY DAY  . ferrous sulfate 325 (65 FE) MG EC tablet Take 325 mg by mouth daily. FERROUS SULFATE, 325 (65 Fe)MG (Oral Tablet Delayed Release)  1 every day for 0 days  Quantity: 0.00;  Refills: 0   Ordered :13-Apr-2010  Celene Kras, MA, Anastasiya ;  Started 29-October-2008 Active Comments: DX: 285.9  . fexofenadine (ALLEGRA) 180 MG tablet Take 180 mg by mouth at bedtime.   Marland Kitchen FLUARIX QUADRIVALENT 0.5 ML injection   . fluticasone (FLONASE) 50 MCG/ACT nasal spray USE 2 SPRAYS EACH NOSTRIL  DAILY  . hydrochlorothiazide (HYDRODIURIL) 25 MG tablet TAKE ONE (1) TABLET EACH DAY  . losartan (COZAAR) 100 MG tablet TAKE ONE (1) TABLET EACH DAY  . Mesalamine (ASACOL) 400 MG CPDR DR capsule Take 2 capsules (800 mg total) by mouth 2 (two) times daily.  . montelukast (SINGULAIR) 10 MG tablet TAKE ONE TABLET AT BEDTIME  . Multiple Vitamins-Minerals (CENTRUM SILVER PO) Take 1 capsule by mouth daily.  . Omega-3 Fatty Acids (FISH OIL) 1000 MG CAPS Take 1 capsule by mouth daily. FISH OIL CONCENTRATE, 1000MG (Oral Capsule)  1 Every Day for 0 days  Quantity: 0.00;  Refills: 0   Ordered :13-Apr-2010  Celene Kras, MA, Anastasiya ;  Started 29-October-2008 Active Comments: DX:  414.00  . rosuvastatin (CRESTOR) 20 MG tablet Take 0.5 tablets (10 mg total) by mouth daily. (Patient taking differently: Take 10 mg by mouth at bedtime. )  . vitamin E 400 UNIT capsule Take 400 Units by mouth daily.  . [DISCONTINUED] MULTIPLE VITAMIN PO Take 1 tablet by mouth daily. MULTIVITAMINS (Oral Tablet)  1 Every Day for 0 days  Quantity: 0.00;  Refills: 0   Ordered :13-Apr-2010  Celene Kras, MA, Anastasiya ;  Started 29-October-2008 Active Comments: DX: 414.00  . methimazole (TAPAZOLE) 5 MG tablet Take 2.5 mg by mouth.   . nitrofurantoin, macrocrystal-monohydrate, (MACROBID) 100 MG capsule Take 1 capsule (100 mg total) 2 (two) times daily by mouth. (Patient not taking: Reported on 08/15/2017)  . terbinafine (LAMISIL) 250 MG tablet Take by mouth.  . triamcinolone cream (KENALOG) 0.1 %    No facility-administered encounter medications on file as of 08/15/2017.     Activities of Daily Living In your present state of health, do you have any difficulty performing the following activities: 08/15/2017 06/15/2017  Hearing? N N  Vision? N N  Difficulty concentrating or making decisions? N N  Walking or climbing stairs? N N  Dressing or bathing? N N  Doing errands, shopping? N N  Preparing Food and eating ? N -  Using the Toilet? N -    In the past six months, have you accidently leaked urine? N -  Do you have problems with loss of bowel control? Y -  Comment Occasionally due to colitis.  -  Managing your Medications? N -  Managing your Finances? N -  Housekeeping or managing your Housekeeping? N -  Some recent data might be hidden    Patient Care Team: [name], Dionne Bucy, MD as PCP - General (Family Medicine) Schnier, Dolores Lory, MD as Consulting Physician (Vascular Surgery) Corey Skains, MD as Consulting Physician (Cardiology) Lonia Farber, MD as Consulting Physician (Internal Medicine) Poggi, Marshall Cork, MD as Consulting Physician (Surgery) Corey Skains, MD as Consulting Physician (Cardiology) Bertram Gala as Physician Assistant (Gastroenterology) Rubye Beach as Physician Assistant (Family Medicine)    Assessment:   This is a routine wellness examination for [place].  Exercise Activities and Dietary recommendations Time (Minutes): > 60, Intensity: Mild  Goals    . DIET - INCREASE WATER INTAKE     Recommend increasing water intake to 4 glasses of water a day.     . Exercise 150 minutes per week (moderate activity)       Fall Risk Fall Risk  08/15/2017 06/21/2016 06/10/2015 12/16/2014  Falls in the past year? Yes Yes No No  Number falls in past yr: 2 or more 1 - -  Comment fell due to snow - - -  Injury with Fall? No No - -  Follow up Falls prevention discussed - - -   Is the patient's home free of loose throw rugs in walkways, pet beds, electrical cords, etc?   yes      Grab bars in the bathroom? yes      Handrails on the stairs?   no      Adequate lighting?   yes  Timed Get Up and Go performed: N/A  Depression Screen PHQ 2/9 Scores 08/15/2017 08/15/2017 06/21/2016 06/10/2015  PHQ - 2 Score 0 0 0 0  PHQ- 9 Score 1 - - -     Cognitive Function: Pt declined screening today.         Immunization History  Administered Date(s) Administered  . Influenza Split  04/19/2011  . Influenza, High Dose Seasonal PF 02/18/2014, 03/12/2016  . Influenza,inj,Quad PF,6+ Mos 02/10/2013  . Influenza-Unspecified 03/01/2015  . Pneumococcal Conjugate-13 05/18/2013  . Pneumococcal Polysaccharide-23 03/02/2004  . Td 02/08/2005  . Zoster 01/29/2006    Qualifies for Shingles Vaccine? Due for Shingles vaccine. Declined my offer to administer today. Education has been provided regarding the importance of this vaccine. Pt has been advised to call her insurance company to determine her out of pocket expense. Advised she may also receive this vaccine at her local pharmacy or Health Dept. Verbalized acceptance and understanding.  Screening Tests Health Maintenance  Topic Date Due  . PNA vac Low Risk Adult (2 of 2 - PPSV23) 05/18/2014  . TETANUS/TDAP  02/09/2015  . INFLUENZA VACCINE  Completed  . DEXA SCAN  Completed    Cancer Screenings: Lung: Low Dose CT Chest recommended if Age 38-80 years, 30 pack-year currently smoking OR have quit w/in 15years. Patient does not qualify. Breast:  Up to date on Mammogram? N/A Up to date of Bone Density/Dexa? Yes Colorectal: Up to date  Additional Screenings:  Hepatitis C Screening: N/A  Plan:  I have personally reviewed and addressed the Medicare Annual Wellness questionnaire and have noted the following in the patient's chart:  A. Medical and social history B. Use of alcohol, tobacco or illicit drugs  C. Current medications and supplements D. Functional ability and status E.  Nutritional status F.  Physical activity G. Advance directives H. List of other physicians I.  Hospitalizations, surgeries, and ER visits in previous 12 months J.  [place] such as hearing and vision if needed, cognitive and depression L. Referrals and appointments - none  In addition, I have reviewed and discussed with patient certain preventive protocols, quality metrics, and best practice recommendations. A written personalized care  plan for preventive services as well as general preventive health recommendations were provided to patient.  See attached scanned questionnaire for additional information.   Signed,  Fabio Neighbors, LPN Nurse Health Advisor   Nurse Recommendations: Pt declined the tetanus and pneumovax vaccines today. Pt would like to speak further with PCP about recieving the Pneumovax again.

## 2017-08-15 NOTE — Patient Instructions (Signed)
Preventive Care 77 Years and Older, Female Preventive care refers to lifestyle choices and visits with your health care provider that can promote health and wellness. What does preventive care include?  A yearly physical exam. This is also called an annual well check.  Dental exams once or twice a year.  Routine eye exams. Ask your health care provider how often you should have your eyes checked.  Personal lifestyle choices, including: ? Daily care of your teeth and gums. ? Regular physical activity. ? Eating a healthy diet. ? Avoiding tobacco and drug use. ? Limiting alcohol use. ? Practicing safe sex. ? Taking low-dose aspirin every day. ? Taking vitamin and mineral supplements as recommended by your health care provider. What happens during an annual well check? The services and screenings done by your health care provider during your annual well check will depend on your age, overall health, lifestyle risk factors, and family history of disease. Counseling Your health care provider may ask you questions about your:  Alcohol use.  Tobacco use.  Drug use.  Emotional well-being.  Home and relationship well-being.  Sexual activity.  Eating habits.  History of falls.  Memory and ability to understand (cognition).  Work and work environment.  Reproductive health.  Screening You may have the following tests or measurements:  Height, weight, and BMI.  Blood pressure.  Lipid and cholesterol levels. These may be checked every 5 years, or more frequently if you are over 50 years old.  Skin check.  Lung cancer screening. You may have this screening every year starting at age 55 if you have a 30-pack-year history of smoking and currently smoke or have quit within the past 15 years.  Fecal occult blood test (FOBT) of the stool. You may have this test every year starting at age 50.  Flexible sigmoidoscopy or colonoscopy. You may have a sigmoidoscopy every 5 years or  a colonoscopy every 10 years starting at age 50.  Hepatitis C blood test.  Hepatitis B blood test.  Sexually transmitted disease (STD) testing.  Diabetes screening. This is done by checking your blood sugar (glucose) after you have not eaten for a while (fasting). You may have this done every 1-3 years.  Bone density scan. This is done to screen for osteoporosis. You may have this done starting at age 77.  Mammogram. This may be done every 1-2 years. Talk to your health care provider about how often you should have regular mammograms.  Talk with your health care provider about your test results, treatment options, and if necessary, the need for more tests. Vaccines Your health care provider may recommend certain vaccines, such as:  Influenza vaccine. This is recommended every year.  Tetanus, diphtheria, and acellular pertussis (Tdap, Td) vaccine. You may need a Td booster every 10 years.  Varicella vaccine. You may need this if you have not been vaccinated.  Zoster vaccine. You may need this after age 60.  Measles, mumps, and rubella (MMR) vaccine. You may need at least one dose of MMR if you were born in 1957 or later. You may also need a second dose.  Pneumococcal 13-valent conjugate (PCV13) vaccine. One dose is recommended after age 77.  Pneumococcal polysaccharide (PPSV23) vaccine. One dose is recommended after age 77.  Meningococcal vaccine. You may need this if you have certain conditions.  Hepatitis A vaccine. You may need this if you have certain conditions or if you travel or work in places where you may be exposed to hepatitis   A.  Hepatitis B vaccine. You may need this if you have certain conditions or if you travel or work in places where you may be exposed to hepatitis B.  Haemophilus influenzae type b (Hib) vaccine. You may need this if you have certain conditions.  Talk to your health care provider about which screenings and vaccines you need and how often you  need them. This information is not intended to replace advice given to you by your health care provider. Make sure you discuss any questions you have with your health care provider. Document Released: 06/13/2015 Document Revised: 02/04/2016 Document Reviewed: 03/18/2015 Elsevier Interactive Patient Education  2018 [employer]  

## 2017-08-15 NOTE — Progress Notes (Signed)
Patient: Misty Page, Female    DOB: 03-26-1941, 77 y.o.   MRN: [id number] Visit Date: 08/15/2017  Today's Provider: Lavon Paganini, MD   I, Martha Clan, CMA, am acting as scribe for Lavon Paganini, MD.  Chief Complaint  Patient presents with  . Annual Exam   Subjective:     Complete Physical Misty Page is a 77 y.o. female. She feels well. She reports exercising 6-9 times per week, when she bowls. She reports she is sleeping well.  Last colonoscopy- 07/14/2015- Diverticulosis. Otherwise WNL. Patient is followed by [facility] for UC. Patient refuses any further mammograms due to rupture of implant. She is s/p b/l mastectomy with reconstruction due to removal of several breast "tumors." States they were "iffy," but the surgeon had a bad feeling -----------------------------------------------------------   Review of Systems  Constitutional: Negative.   HENT: Negative.   Eyes: Negative.   Respiratory: Negative.   Cardiovascular: Negative.   Gastrointestinal: Positive for blood in stool. Negative for abdominal distention, abdominal pain, anal bleeding, constipation, diarrhea, nausea, rectal pain and vomiting.  Endocrine: Negative.   Genitourinary: Negative.   Musculoskeletal: Negative.   Skin: Negative.   Allergic/Immunologic: Negative.   Neurological: Negative.   Hematological: Negative.   Psychiatric/Behavioral: Negative.     Social History   Socioeconomic History  . Marital status: Widowed    Spouse name: Konrad Dolores  . Number of children: 1  . Years of education: Not on file  . Highest education level: 12th grade  Social Needs  . Financial resource strain: Not hard at all  . Food insecurity - worry: Never true  . Food insecurity - inability: Never true  . Transportation needs - medical: No  . Transportation needs - non-medical: No  Occupational History  . Occupation: hair dresser  Tobacco Use  . Smoking status: Former Smoker    Last  attempt to quit: 05/30/1994    Years since quitting: 23.2  . Smokeless tobacco: Never Used  Substance and Sexual Activity  . Alcohol use: Yes    Comment: rare  . Drug use: No  . Sexual activity: No  Other Topics Concern  . Not on file  Social History Narrative  . Not on file    Past Medical History:  Diagnosis Date  . Allergy   . Anemia   . Arthritis   . Colitis   . Complication of anesthesia    nausea  . Environmental and seasonal allergies   . GERD (gastroesophageal reflux disease)   . Hyperlipidemia   . Hypertension   . Myocardial infarction ([place])   . Osteoporosis   . Thyroid disease   . TIA (transient ischemic attack)      Patient Active Problem List   Diagnosis Date Noted  . 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 TIA (transient ischemic attack) 04/18/2017  . Bilateral carotid artery stenosis 02/07/2017  . Allergic rhinitis, seasonal 10/15/2014  . Cardiac murmur 10/15/2014  . History of colon polyps 10/15/2014  . Hyperthyroidism 10/15/2014  . Cannot sleep 10/15/2014  . Calculus of kidney 10/15/2014  . Herpes zona 10/15/2014  . CA of skin 10/15/2014  . Ulcerative colitis ([place]) 10/15/2014  . Aortic heart valve narrowing 12/03/2013  . Arteriosclerosis of coronary artery 11/23/2013  . Myocardial infarction ([place]) 11/23/2013  . Peripheral vascular disease ([place]) 11/23/2013  . Diverticulitis of colon 07/21/2009  . Complication of internal prosthetic device 01/27/2009  . Hemorrhoids without complication 82/50/5397  . Current tobacco use 01/27/2009  .  Absolute anemia 10/29/2008  . Essential (primary) hypertension 10/29/2008  . Acid reflux 10/29/2008  . Hypercholesteremia 10/29/2008  . OP (osteoporosis) 10/29/2008  . Healed myocardial infarct 05/31/1997    Past Surgical History:  Procedure Laterality Date  . ABDOMINAL HYSTERECTOMY  2007  . BREAST ENHANCEMENT SURGERY  2004  . BREAST SURGERY  1984   Fibrocystic Disease  . CAROTID ENDARTERECTOMY Left  06/21/2014   Dr. Delana Meyer  . CAROTID ENDARTERECTOMY Right 08/02/2014   Dr. Delana Meyer  . CAROTID PTA/STENT INTERVENTION N/A 06/15/2017   Procedure: CAROTID PTA/STENT INTERVENTION;  Surgeon: Katha Cabal, MD;  Location: [facility];  Service: Cardiovascular;  Laterality: N/A;  . COLONOSCOPY WITH PROPOFOL N/A 07/14/2015   Procedure: COLONOSCOPY WITH PROPOFOL;  Surgeon: Hulen Luster, MD;  Location: [facility] END[facility]COPY;  Service: Gastroenterology;  Laterality: N/A;  . CORONARY ANGIOPLASTY WITH STENT PLACEMENT  1999  . FEMORAL ARTERY STENT  08/2003  . HERNIA REPAIR  2008  . RENAL ARTERY STENT  08/2003    Her family history includes Alzheimer's disease in her sister; Arthritis in her sister; Congestive Heart Failure in her mother; Emphysema in her brother; Heart attack in her brother; Hyperlipidemia in her mother; Kidney failure in her brother; Leukemia in her brother; Pancreatic cancer in her father; Prostate cancer in her brother; Stomach cancer in her brother.      Current Outpatient Medications:  .  Ascorbic Acid (VITAMIN C) 1000 MG tablet, Take 500 mg by mouth daily. , Disp: , Rfl:  .  aspirin 81 MG tablet, Take 81 mg by mouth daily. ASPIRIN, 81MG (Oral Tablet)  1 Every Day for 0 days  Quantity: 0.00;  Refills: 0   Ordered :13-Apr-2010  Celene Kras, MA, Anastasiya ;  Started 29-October-2008 Active Comments: DX: 414.00, Disp: , Rfl:  .  B COMPLEX VITAMINS PO, Take 1 tablet by mouth daily. B COMPLEX (Oral Capsule)  1 Every Day for 0 days  Quantity: 0.00;  Refills: 0   Ordered :13-Apr-2010  Celene Kras, MA, Anastasiya ;  Started 29-October-2008 Active Comments: DX: 414.00, Disp: , Rfl:  .  Calcium-Vitamin D-Vitamin K 500-100-40 MG-UNT-MCG CHEW, Chew 1 tablet by mouth 2 (two) times daily. VIACTIV, 500-100-40 (Oral Tablet Chewable)  1 tablet twice a day for 0 days  Quantity: 0.00;  Refills: 0   Ordered :13-Apr-2010  Celene Kras, MA, Anastasiya ;  Started 29-October-2008 Active Comments: DX: 733.00, Disp: ,  Rfl:  .  clopidogrel (PLAVIX) 75 MG tablet, Take 1 tablet (75 mg total) by mouth daily., Disp: 30 tablet, Rfl: 3 .  Coenzyme Q10 (COQ10) 200 MG CAPS, Take 1 capsule by mouth at bedtime., Disp: , Rfl:  .  Cranberry 300 MG tablet, Take 300 mg by mouth daily. , Disp: , Rfl:  .  famotidine (PEPCID) 40 MG tablet, TAKE ONE (1) TABLET BY MOUTH EVERY DAY, Disp: 90 tablet, Rfl: 1 .  ferrous sulfate 325 (65 FE) MG EC tablet, Take 325 mg by mouth daily. FERROUS SULFATE, 325 (65 Fe)MG (Oral Tablet Delayed Release)  1 every day for 0 days  Quantity: 0.00;  Refills: 0   Ordered :13-Apr-2010  Celene Kras, MA, Anastasiya ;  Started 29-October-2008 Active Comments: DX: 285.9, Disp: , Rfl:  .  fexofenadine (ALLEGRA) 180 MG tablet, Take 180 mg by mouth at bedtime. , Disp: , Rfl:  .  FLUARIX QUADRIVALENT 0.5 ML injection, , Disp: , Rfl:  .  fluticasone (FLONASE) 50 MCG/ACT nasal spray, USE 2 SPRAYS EACH NOSTRIL DAILY, Disp: 48 g, Rfl:  1 .  hydrochlorothiazide (HYDRODIURIL) 25 MG tablet, TAKE ONE (1) TABLET EACH DAY, Disp: 90 tablet, Rfl: 3 .  losartan (COZAAR) 100 MG tablet, TAKE ONE (1) TABLET EACH DAY, Disp: 90 tablet, Rfl: 1 .  Mesalamine (ASACOL) 400 MG CPDR DR capsule, Take 2 capsules (800 mg total) by mouth 2 (two) times daily., Disp: 120 capsule, Rfl: 0 .  methimazole (TAPAZOLE) 5 MG tablet, Take 2.5 mg by mouth. , Disp: , Rfl:  .  montelukast (SINGULAIR) 10 MG tablet, TAKE ONE TABLET AT BEDTIME, Disp: 90 tablet, Rfl: 2 .  Multiple Vitamins-Minerals (CENTRUM SILVER PO), Take 1 capsule by mouth daily., Disp: , Rfl:  .  Omega-3 Fatty Acids (FISH OIL) 1000 MG CAPS, Take 1 capsule by mouth daily. FISH OIL CONCENTRATE, 1000MG (Oral Capsule)  1 Every Day for 0 days  Quantity: 0.00;  Refills: 0   Ordered :13-Apr-2010  Celene Kras, MA, Anastasiya ;  Started 29-October-2008 Active Comments: DX: 414.00, Disp: , Rfl:  .  rosuvastatin (CRESTOR) 20 MG tablet, Take 0.5 tablets (10 mg total) by mouth daily. (Patient taking  differently: Take 10 mg by mouth at bedtime. ), Disp: 45 tablet, Rfl: 3 .  terbinafine (LAMISIL) 250 MG tablet, Take by mouth., Disp: , Rfl:  .  triamcinolone cream (KENALOG) 0.1 %, , Disp: , Rfl:  .  vitamin E 400 UNIT capsule, Take 400 Units by mouth daily., Disp: , Rfl:   Patient Care Team: Virginia Crews, MD as PCP - General (Family Medicine) Schnier, Dolores Lory, MD as Consulting Physician (Vascular Surgery) Corey Skains, MD as Consulting Physician (Cardiology) Lonia Farber, MD as Consulting Physician (Internal Medicine) Poggi, Marshall Cork, MD as Consulting Physician (Surgery) Corey Skains, MD as Consulting Physician (Cardiology) Bertram Gala as Physician Assistant (Gastroenterology) Mar Daring, [name] as Physician Assistant (Family Medicine)     Objective:   Vitals: BP 120/60 (BP Location: Right Arm, Patient Position: Sitting, Cuff Size: Normal)   Pulse 70   Temp 98.7 F (37.1 C) (Oral)   Resp 16   Ht 5' 1"  (1.549 m)   Wt 136 lb (61.7 kg)   SpO2 98%   BMI 25.70 kg/m   Physical Exam  Constitutional: She is oriented to person, place, and time. She appears well-developed and well-nourished. No distress.  HENT:  Head: Normocephalic and atraumatic.  Right Ear: External ear normal.  Left Ear: External ear normal.  Nose: Nose normal.  Mouth/Throat: Oropharynx is clear and moist.  Eyes: Conjunctivae and EOM are normal. Pupils are equal, round, and reactive to light. No scleral icterus.  Neck: Neck supple. No thyromegaly present.  Cardiovascular: Normal rate, regular rhythm and intact distal pulses.  Murmur heard. Pulmonary/Chest: Breath sounds normal. No respiratory distress. She has no wheezes. She has no rales.  Abdominal: Soft. Bowel sounds are normal. She exhibits no distension. There is no tenderness. There is no rebound and no guarding.  Musculoskeletal: She exhibits no edema or deformity.  Lymphadenopathy:    She has no cervical  adenopathy.  Neurological: She is alert and oriented to person, place, and time.  Skin: Skin is warm and dry. No rash noted.  Psychiatric: She has a normal mood and affect. Her behavior is normal.  Vitals reviewed.   Activities of Daily Living In your present state of health, do you have any difficulty performing the following activities: 08/15/2017 06/15/2017  Hearing? N N  Vision? N N  Difficulty concentrating or making decisions? N N  Walking or climbing stairs? N N  Dressing or bathing? N N  Doing errands, shopping? N N  Preparing Food and eating ? N -  Using the Toilet? N -  In the past six months, have you accidently leaked urine? N -  Do you have problems with loss of bowel control? Y -  Comment Occasionally due to colitis.  -  Managing your Medications? N -  Managing your Finances? N -  Housekeeping or managing your Housekeeping? N -  Some recent data might be hidden    Fall Risk Assessment Fall Risk  08/15/2017 06/21/2016 06/10/2015 12/16/2014  Falls in the past year? Yes Yes No No  Number falls in past yr: 2 or more 1 - -  Comment fell due to snow - - -  Injury with Fall? No No - -  Follow up Falls prevention discussed - - -     Depression Screen PHQ 2/9 Scores 08/15/2017 08/15/2017 06/21/2016 06/10/2015  PHQ - 2 Score 0 0 0 0  PHQ- 9 Score 1 - - -    Pt refused 6CIT screening.   Assessment & Plan:    Annual Physical Reviewed patient's Family Medical History Reviewed and updated list of patient's medical providers Assessment of cognitive impairment was done Assessed patient's functional ability Established a written schedule for health screening [place] Completed and Reviewed  Exercise Activities and Dietary recommendations Goals    . DIET - INCREASE WATER INTAKE     Recommend increasing water intake to 4 glasses of water a day.     . Exercise 150 minutes per week (moderate activity)       Immunization History  Administered  Date(s) Administered  . Influenza Split 04/19/2011  . Influenza, High Dose Seasonal PF 02/18/2014, 03/12/2016  . Influenza,inj,Quad PF,6+ Mos 02/10/2013  . Influenza-Unspecified 03/01/2015  . Pneumococcal Conjugate-13 05/18/2013  . Pneumococcal Polysaccharide-23 03/02/2004  . Td 02/08/2005  . Zoster 01/29/2006    Health Maintenance  Topic Date Due  . PNA vac Low Risk Adult (2 of 2 - PPSV23) 05/18/2014  . TETANUS/TDAP  02/09/2015  . INFLUENZA VACCINE  Completed  . DEXA SCAN  Completed     Discussed health benefits of physical activity, and encouraged her to engage in regular exercise appropriate for her age and condition.    ------------------------------------------------------------------------------------------------------------  The entirety of the information documented in the History of Present Illness, Review of Systems and Physical Exam were personally obtained by me. Portions of this information were initially documented by Raquel Sarna Ratchford, CMA and reviewed by me for thoroughness and accuracy.    Virginia Crews, MD, MPH [facility] 08/15/2017 5:03 PM

## 2017-08-15 NOTE — Patient Instructions (Addendum)
Misty Page , Thank you for taking time to come for your Medicare Wellness Visit. I appreciate your ongoing commitment to your health goals. Please review the following plan we discussed and let me know if I can assist you in the future.   Screening recommendations/referrals: Colonoscopy: Up to date Mammogram: N/A Bone Density: Up to date Recommended yearly ophthalmology/optometry visit for glaucoma screening and checkup Recommended yearly dental visit for hygiene and checkup  Vaccinations: Influenza vaccine: Up to date Pneumococcal vaccine: Due for Pneumovax- pt would like to speak with PCP about this.  Tdap vaccine: Pt declines today.  Shingles vaccine: Pt declines today.     Advanced directives: Please bring a copy of your POA (Power of Attorney) and/or Living Will to your next appointment.   Conditions/risks identified: Fall risk prevention; Recommend increasing water intake to 4 glasses of water a day.   Next appointment: 2:00 PM today with Dr Brita Romp.   Preventive Care 37 Years and Older, Female Preventive care refers to lifestyle choices and visits with your health care provider that can promote health and wellness. What does preventive care include?  A yearly physical exam. This is also called an annual well check.  Dental exams once or twice a year.  Routine eye exams. Ask your health care provider how often you should have your eyes checked.  Personal lifestyle choices, including:  Daily care of your teeth and gums.  Regular physical activity.  Eating a healthy diet.  Avoiding tobacco and drug use.  Limiting alcohol use.  Practicing safe sex.  Taking low-dose aspirin every day.  Taking vitamin and mineral supplements as recommended by your health care provider. What happens during an annual well check? The services and screenings done by your health care provider during your annual well check will depend on your age, overall health, lifestyle risk  factors, and family history of disease. Counseling  Your health care provider may ask you questions about your:  Alcohol use.  Tobacco use.  Drug use.  Emotional well-being.  Home and relationship well-being.  Sexual activity.  Eating habits.  History of falls.  Memory and ability to understand (cognition).  Work and work Statistician.  Reproductive health. Screening  You may have the following tests or measurements:  Height, weight, and BMI.  Blood pressure.  Lipid and cholesterol levels. These may be checked every 5 years, or more frequently if you are over 35 years old.  Skin check.  Lung cancer screening. You may have this screening every year starting at age 12 if you have a 30-pack-year history of smoking and currently smoke or have quit within the past 15 years.  Fecal occult blood test (FOBT) of the stool. You may have this test every year starting at age 42.  Flexible sigmoidoscopy or colonoscopy. You may have a sigmoidoscopy every 5 years or a colonoscopy every 10 years starting at age 75.  Hepatitis C blood test.  Hepatitis B blood test.  Sexually transmitted disease (STD) testing.  Diabetes screening. This is done by checking your blood sugar (glucose) after you have not eaten for a while (fasting). You may have this done every 1-3 years.  Bone density scan. This is done to screen for osteoporosis. You may have this done starting at age 5.  Mammogram. This may be done every 1-2 years. Talk to your health care provider about how often you should have regular mammograms. Talk with your health care provider about your test results, treatment options, and if necessary,  the need for more tests. Vaccines  Your health care provider may recommend certain vaccines, such as:  Influenza vaccine. This is recommended every year.  Tetanus, diphtheria, and acellular pertussis (Tdap, Td) vaccine. You may need a Td booster every 10 years.  Zoster vaccine. You  may need this after age 94.  Pneumococcal 13-valent conjugate (PCV13) vaccine. One dose is recommended after age 102.  Pneumococcal polysaccharide (PPSV23) vaccine. One dose is recommended after age 4. Talk to your health care provider about which screenings and vaccines you need and how often you need them. This information is not intended to replace advice given to you by your health care provider. Make sure you discuss any questions you have with your health care provider. Document Released: 06/13/2015 Document Revised: 02/04/2016 Document Reviewed: 03/18/2015 Elsevier Interactive Patient Education  2017 [place] Prevention in the Home Falls can cause injuries. They can happen to people of all ages. There are many things you can do to make your home safe and to help prevent falls. What can I do on the outside of my home?  Regularly fix the edges of walkways and driveways and fix any cracks.  Remove anything that might make you trip as you walk through a door, such as a raised step or threshold.  Trim any bushes or trees on the path to your home.  Use bright outdoor lighting.  Clear any walking paths of anything that might make someone trip, such as rocks or tools.  Regularly check to see if handrails are loose or broken. Make sure that both sides of any steps have handrails.  Any raised decks and porches should have guardrails on the edges.  Have any leaves, snow, or ice cleared regularly.  Use sand or salt on walking paths during winter.  Clean up any spills in your garage right away. This includes oil or grease spills. What can I do in the bathroom?  Use night lights.  Install grab bars by the toilet and in the tub and shower. Do not use towel bars as grab bars.  Use non-skid mats or decals in the tub or shower.  If you need to sit down in the shower, use a plastic, non-slip stool.  Keep the floor dry. Clean up any water that spills on the floor as soon as  it happens.  Remove soap buildup in the tub or shower regularly.  Attach bath mats securely with double-sided non-slip rug tape.  Do not have throw rugs and other things on the floor that can make you trip. What can I do in the bedroom?  Use night lights.  Make sure that you have a light by your bed that is easy to reach.  Do not use any sheets or blankets that are too big for your bed. They should not hang down onto the floor.  Have a firm chair that has side arms. You can use this for support while you get dressed.  Do not have throw rugs and other things on the floor that can make you trip. What can I do in the kitchen?  Clean up any spills right away.  Avoid walking on wet floors.  Keep items that you use a lot in easy-to-reach places.  If you need to reach something above you, use a strong step stool that has a grab bar.  Keep electrical cords out of the way.  Do not use floor polish or wax that makes floors slippery. If you must use  wax, use non-skid floor wax.  Do not have throw rugs and other things on the floor that can make you trip. What can I do with my stairs?  Do not leave any items on the stairs.  Make sure that there are handrails on both sides of the stairs and use them. Fix handrails that are broken or loose. Make sure that handrails are as long as the stairways.  Check any carpeting to make sure that it is firmly attached to the stairs. Fix any carpet that is loose or worn.  Avoid having throw rugs at the top or bottom of the stairs. If you do have throw rugs, attach them to the floor with carpet tape.  Make sure that you have a light switch at the top of the stairs and the bottom of the stairs. If you do not have them, ask someone to add them for you. What else can I do to help prevent falls?  Wear shoes that:  Do not have high heels.  Have rubber bottoms.  Are comfortable and fit you well.  Are closed at the toe. Do not wear sandals.  If  you use a stepladder:  Make sure that it is fully opened. Do not climb a closed stepladder.  Make sure that both sides of the stepladder are locked into place.  Ask someone to hold it for you, if possible.  Clearly mark and make sure that you can see:  Any grab bars or handrails.  First and last steps.  Where the edge of each step is.  Use tools that help you move around (mobility aids) if they are needed. These include:  Canes.  Walkers.  Scooters.  Crutches.  Turn on the lights when you go into a dark area. Replace any light bulbs as soon as they burn out.  Set up your furniture so you have a clear path. Avoid moving your furniture around.  If any of your floors are uneven, fix them.  If there are any pets around you, be aware of where they are.  Review your medicines with your doctor. Some medicines can make you feel dizzy. This can increase your chance of falling. Ask your doctor what other things that you can do to help prevent falls. This information is not intended to replace advice given to you by your health care provider. Make sure you discuss any questions you have with your health care provider. Document Released: 03/13/2009 Document Revised: 10/23/2015 Document Reviewed: 06/21/2014 Elsevier Interactive Patient Education  2017 [employer].

## 2017-08-22 ENCOUNTER — Encounter
Admission: RE | Admit: 2017-08-22 | Discharge: 2017-08-22 | Disposition: A | Discharge status: Home / Self Care | Payer: PPO | Source: Ambulatory Visit | Attending: "Endocrinology | Admitting: "Endocrinology

## 2017-08-22 DIAGNOSIS — E0590 Thyrotoxicosis, unspecified without thyrotoxic crisis or storm: Secondary | ICD-10-CM | POA: Diagnosis not present

## 2017-08-22 DIAGNOSIS — E042 Nontoxic multinodular goiter: Secondary | ICD-10-CM | POA: Insufficient documentation

## 2017-08-22 MED ORDER — SODIUM PERTECHNETATE TC 99M INJECTION
10.0000 | Freq: Once | INTRAVENOUS | Status: AC | PRN
  Administered 2017-08-22: 10.6 via INTRAVENOUS

## 2017-08-31 ENCOUNTER — Ambulatory Visit (INDEPENDENT_AMBULATORY_CARE_PROVIDER_SITE_OTHER): Payer: PPO | Admitting: Family Medicine

## 2017-08-31 ENCOUNTER — Encounter: Payer: Self-pay | Admitting: Family Medicine

## 2017-08-31 VITALS — BP 150/74 | HR 82 | Temp 98.5°F | Resp 16 | Wt 135.0 lb

## 2017-08-31 DIAGNOSIS — J069 Acute upper respiratory infection, unspecified: Secondary | ICD-10-CM | POA: Diagnosis not present

## 2017-08-31 NOTE — Patient Instructions (Signed)
Upper Respiratory Infection, Adult Most upper respiratory infections (URIs) are caused by a virus. A URI affects the nose, throat, and upper air passages. The most common type of URI is often called "the common cold." Follow these instructions at home:  Take medicines only as told by your doctor.  Gargle warm saltwater or take cough drops to comfort your throat as told by your doctor.  Use a warm mist humidifier or inhale steam from a shower to increase air moisture. This may make it easier to breathe.  Drink enough fluid to keep your pee (urine) clear or pale yellow.  Eat soups and other clear broths.  Have a healthy diet.  Rest as needed.  Go back to work when your fever is gone or your doctor says it is okay. ? You may need to stay home longer to avoid giving your URI to others. ? You can also wear a face mask and wash your hands often to prevent spread of the virus.  Use your inhaler more if you have asthma.  Do not use any tobacco products, including cigarettes, chewing tobacco, or electronic cigarettes. If you need help quitting, ask your doctor. Contact a doctor if:  You are getting worse, not better.  Your symptoms are not helped by medicine.  You have chills.  You are getting more short of breath.  You have brown or red mucus.  You have yellow or brown discharge from your nose.  You have pain in your face, especially when you bend forward.  You have a fever.  You have puffy (swollen) neck glands.  You have pain while swallowing.  You have white areas in the back of your throat. Get help right away if:  You have very bad or constant: ? Headache. ? Ear pain. ? Pain in your forehead, behind your eyes, and over your cheekbones (sinus pain). ? Chest pain.  You have long-lasting (chronic) lung disease and any of the following: ? Wheezing. ? Long-lasting cough. ? Coughing up blood. ? A change in your usual mucus.  You have a stiff neck.  You have  changes in your: ? Vision. ? Hearing. ? Thinking. ? Mood. This information is not intended to replace advice given to you by your health care provider. Make sure you discuss any questions you have with your health care provider. Document Released: 11/03/2007 Document Revised: 01/18/2016 Document Reviewed: 08/22/2013 Elsevier Interactive Patient Education  2018 [employer]  

## 2017-08-31 NOTE — Progress Notes (Signed)
Patient: Misty Page Female    DOB: [birth year]   77 y.o.   MRN: [id number] Visit Date: 08/31/2017  Today's Provider: Lavon Paganini, MD   I, Martha Clan, CMA, am acting as scribe for Lavon Paganini, MD.  Chief Complaint  Patient presents with  . URI   Subjective:    URI   This is a new problem. Episode onset: x 2 days. The problem has been gradually worsening. The maximum temperature recorded prior to her arrival was 101 - 101.9 F (last night). Associated symptoms include congestion, coughing (dry), headaches, nausea, a plugged ear sensation, sinus pain, sneezing and a sore throat (scratchy). Pertinent negatives include no abdominal pain, chest pain, diarrhea, dysuria, ear pain, neck pain, rhinorrhea, swollen glands, vomiting or wheezing. She has tried acetaminophen for the symptoms. The treatment provided mild relief.      Allergies  Allergen Reactions  . Naproxen Hives, Shortness Of Breath, Nausea And Vomiting and Swelling  . Penicillins Shortness Of Breath, Swelling and Rash    Has patient had a PCN reaction causing immediate rash, facial/tongue/throat swelling, SOB or lightheadedness with hypotension: Yes Has patient had a PCN reaction causing severe rash involving mucus membranes or skin necrosis: Yes Has patient had a PCN reaction that required hospitalization: Yes Has patient had a PCN reaction occurring within the last 10 years: No  If all of the above answers are "NO", then may proceed with Cephalosporin use.   . Codeine Nausea And Vomiting  . Levofloxacin Nausea And Vomiting  . Shellfish Allergy Hives, Diarrhea, Nausea And Vomiting and Swelling     Current Outpatient Medications:  .  Ascorbic Acid (VITAMIN C) 1000 MG tablet, Take 500 mg by mouth daily. , Disp: , Rfl:  .  aspirin 81 MG tablet, Take 81 mg by mouth daily. ASPIRIN, 81MG (Oral Tablet)  1 Every Day for 0 days  Quantity: 0.00;  Refills: 0   Ordered :13-Apr-2010  Celene Kras, MA, Anastasiya  ;  Started 29-October-2008 Active Comments: DX: 414.00, Disp: , Rfl:  .  B COMPLEX VITAMINS PO, Take 1 tablet by mouth daily. B COMPLEX (Oral Capsule)  1 Every Day for 0 days  Quantity: 0.00;  Refills: 0   Ordered :13-Apr-2010  Celene Kras, MA, Anastasiya ;  Started 29-October-2008 Active Comments: DX: 414.00, Disp: , Rfl:  .  Calcium-Vitamin D-Vitamin K 500-100-40 MG-UNT-MCG CHEW, Chew 1 tablet by mouth 2 (two) times daily. VIACTIV, 500-100-40 (Oral Tablet Chewable)  1 tablet twice a day for 0 days  Quantity: 0.00;  Refills: 0   Ordered :13-Apr-2010  Celene Kras, MA, Anastasiya ;  Started 29-October-2008 Active Comments: DX: 733.00, Disp: , Rfl:  .  clopidogrel (PLAVIX) 75 MG tablet, Take 1 tablet (75 mg total) by mouth daily., Disp: 30 tablet, Rfl: 3 .  Coenzyme Q10 (COQ10) 200 MG CAPS, Take 1 capsule by mouth at bedtime., Disp: , Rfl:  .  Cranberry 300 MG tablet, Take 300 mg by mouth daily. , Disp: , Rfl:  .  famotidine (PEPCID) 40 MG tablet, TAKE ONE (1) TABLET BY MOUTH EVERY DAY, Disp: 90 tablet, Rfl: 1 .  ferrous sulfate 325 (65 FE) MG EC tablet, Take 325 mg by mouth daily. FERROUS SULFATE, 325 (65 Fe)MG (Oral Tablet Delayed Release)  1 every day for 0 days  Quantity: 0.00;  Refills: 0   Ordered :13-Apr-2010  Celene Kras, MA, Anastasiya ;  Started 29-October-2008 Active Comments: DX: 285.9, Disp: , Rfl:  .  fexofenadine (ALLEGRA) 180  MG tablet, Take 180 mg by mouth at bedtime. , Disp: , Rfl:  .  fluticasone (FLONASE) 50 MCG/ACT nasal spray, USE 2 SPRAYS EACH NOSTRIL DAILY, Disp: 48 g, Rfl: 1 .  hydrochlorothiazide (HYDRODIURIL) 25 MG tablet, TAKE ONE (1) TABLET EACH DAY, Disp: 90 tablet, Rfl: 3 .  losartan (COZAAR) 100 MG tablet, TAKE ONE (1) TABLET EACH DAY, Disp: 90 tablet, Rfl: 1 .  Mesalamine (ASACOL) 400 MG CPDR DR capsule, Take 2 capsules (800 mg total) by mouth 2 (two) times daily., Disp: 120 capsule, Rfl: 0 .  methimazole (TAPAZOLE) 5 MG tablet, Take 2.5 mg by mouth. , Disp: , Rfl:  .  montelukast  (SINGULAIR) 10 MG tablet, TAKE ONE TABLET AT BEDTIME, Disp: 90 tablet, Rfl: 2 .  Multiple Vitamins-Minerals (CENTRUM SILVER PO), Take 1 capsule by mouth daily., Disp: , Rfl:  .  Omega-3 Fatty Acids (FISH OIL) 1000 MG CAPS, Take 1 capsule by mouth daily. FISH OIL CONCENTRATE, 1000MG (Oral Capsule)  1 Every Day for 0 days  Quantity: 0.00;  Refills: 0   Ordered :13-Apr-2010  Celene Kras, MA, Anastasiya ;  Started 29-October-2008 Active Comments: DX: 414.00, Disp: , Rfl:  .  rosuvastatin (CRESTOR) 20 MG tablet, Take 0.5 tablets (10 mg total) by mouth daily. (Patient taking differently: Take 10 mg by mouth at bedtime. ), Disp: 45 tablet, Rfl: 3 .  terbinafine (LAMISIL) 250 MG tablet, Take by mouth., Disp: , Rfl:  .  vitamin E 400 UNIT capsule, Take 400 Units by mouth daily., Disp: , Rfl:  .  triamcinolone cream (KENALOG) 0.1 %, , Disp: , Rfl:   Review of Systems  HENT: Positive for congestion, sinus pain, sneezing and sore throat (scratchy). Negative for ear pain and rhinorrhea.   Respiratory: Positive for cough (dry). Negative for wheezing.   Cardiovascular: Negative for chest pain.  Gastrointestinal: Positive for nausea. Negative for abdominal pain, diarrhea and vomiting.  Genitourinary: Negative for dysuria.  Musculoskeletal: Negative for neck pain.  Neurological: Positive for headaches.    Social History   Tobacco Use  . Smoking status: Former Smoker    Last attempt to quit: 05/30/1994    Years since quitting: 23.2  . Smokeless tobacco: Never Used  Substance Use Topics  . Alcohol use: Yes    Comment: rare   Objective:   BP (!) 150/74 (BP Location: Left Arm, Patient Position: Sitting, Cuff Size: Normal)   Pulse 82   Temp 98.5 F (36.9 C) (Oral)   Resp 16   Wt 135 lb (61.2 kg)   SpO2 91%   BMI 25.51 kg/m  Vitals:   08/31/17 1326  BP: (!) 150/74  Pulse: 82  Resp: 16  Temp: 98.5 F (36.9 C)  TempSrc: Oral  SpO2: 91%  Weight: 135 lb (61.2 kg)     Physical Exam    Constitutional: She is oriented to person, place, and time. She appears well-developed and well-nourished. No distress.  HENT:  Head: Normocephalic and atraumatic.  Right Ear: Tympanic membrane, external ear and ear canal normal.  Left Ear: Tympanic membrane, external ear and ear canal normal.  Nose: Mucosal edema present. Right sinus exhibits no maxillary sinus tenderness and no frontal sinus tenderness. Left sinus exhibits no maxillary sinus tenderness and no frontal sinus tenderness.  Mouth/Throat: Uvula is midline and mucous membranes are normal. No oral lesions. Posterior oropharyngeal erythema present. No oropharyngeal exudate or tonsillar abscesses.  Eyes: Pupils are equal, round, and reactive to light. Conjunctivae are normal. Right eye exhibits  no discharge. Left eye exhibits no discharge. No scleral icterus.  Neck: Neck supple.  Cardiovascular: Normal rate and regular rhythm.  Murmur heard. Pulmonary/Chest: Effort normal and breath sounds normal. No respiratory distress. She has no wheezes.  Musculoskeletal: She exhibits no edema.  Lymphadenopathy:    She has no cervical adenopathy.  Neurological: She is alert and oriented to person, place, and time.  Psychiatric: She has a normal mood and affect. Her behavior is normal.       Assessment & Plan:  1. Viral URI - symptoms and exam c/w viral URI - no evidence of strep pharyngitis, CAP, AOM, bacterial sinusitis, or other bacterial infection - discussed symptomatic management, natural course, and return precautions   Return if symptoms worsen or fail to improve.   The entirety of the information documented in the History of Present Illness, Review of Systems and Physical Exam were personally obtained by me. Portions of this information were initially documented by Raquel Sarna Ratchford, CMA and reviewed by me for thoroughness and accuracy.    Virginia Crews, MD, MPH [facility] 08/31/2017 1:43 PM

## 2017-09-09 ENCOUNTER — Telehealth: Payer: Self-pay | Admitting: Family Medicine

## 2017-09-09 MED ORDER — AZITHROMYCIN 250 MG PO TABS: ORAL_TABLET | ORAL | 0 refills | Status: DC

## 2017-09-09 NOTE — Telephone Encounter (Signed)
Pt advised as below.

## 2017-09-09 NOTE — Telephone Encounter (Signed)
Z-pack sent to pharmacy that she can try.  Still needs to use symptomatic treatment such as nasal saline, mucinex, etc  Xerxes Agrusa, Dionne Bucy, MD, MPH [facility] 09/09/2017 11:33 AM

## 2017-09-09 NOTE — Telephone Encounter (Signed)
Spoke with pt. She is not getting any sleep because she has so much congestion in her head, her throat feels raw and she is coughing but not getting anything up. She has green mucus from her nose. No fevers. No shortness of breath. No sinus pain or pressure but she does have a headache.   She uses [facility]

## 2017-09-09 NOTE — Telephone Encounter (Signed)
Pt called saying she was in here about a week ago for upper resp/ Dr. B told her if she was not better in a week to call back.  She states she is not better and would like someone to call her  Her call back is [phone]  Thanks teri

## 2017-09-12 ENCOUNTER — Other Ambulatory Visit: Payer: Self-pay | Admitting: Physician Assistant

## 2017-09-12 DIAGNOSIS — I10 Essential (primary) hypertension: Secondary | ICD-10-CM

## 2017-09-26 ENCOUNTER — Other Ambulatory Visit: Payer: Self-pay | Admitting: Physician Assistant

## 2017-09-26 ENCOUNTER — Other Ambulatory Visit: Payer: Self-pay

## 2017-09-26 ENCOUNTER — Ambulatory Visit (INDEPENDENT_AMBULATORY_CARE_PROVIDER_SITE_OTHER): Payer: PPO

## 2017-09-26 ENCOUNTER — Encounter (INDEPENDENT_AMBULATORY_CARE_PROVIDER_SITE_OTHER): Payer: Self-pay | Admitting: Vascular Surgery

## 2017-09-26 ENCOUNTER — Ambulatory Visit (INDEPENDENT_AMBULATORY_CARE_PROVIDER_SITE_OTHER): Payer: PPO | Admitting: Vascular Surgery

## 2017-09-26 VITALS — BP 138/69 | HR 77 | Ht 61.0 in | Wt 135.0 lb

## 2017-09-26 DIAGNOSIS — E7800 Pure hypercholesterolemia, unspecified: Secondary | ICD-10-CM

## 2017-09-26 DIAGNOSIS — I252 Old myocardial infarction: Secondary | ICD-10-CM | POA: Diagnosis not present

## 2017-09-26 DIAGNOSIS — I771 Stricture of artery: Secondary | ICD-10-CM

## 2017-09-26 DIAGNOSIS — I10 Essential (primary) hypertension: Secondary | ICD-10-CM

## 2017-09-26 DIAGNOSIS — I6523 Occlusion and stenosis of bilateral carotid arteries: Secondary | ICD-10-CM

## 2017-09-26 NOTE — Progress Notes (Signed)
MRN : [id number]  Misty Page is a 77 y.o. (May 24, 1941) female who presents with chief complaint of No chief complaint on file. Marland Kitchen  History of Present Illness:   The patient is seen for follow up evaluation of carotid stenosis status post stenting of the innominate artery on 04/20/2017.    Procedure preformed 04/20/2017: 1. Ultrasound guidance for vascular access right femoral artery femoral artery 2. Catheter placement into left carotid artery from right femoral approach 3. Aortogram right femoral approach             4.  Arterial cutdown right brachial artery 5. Introduction catheter into aorta right brachial approach with aortogram from the right brachial approach 6. Stent to the left subclavian with 7 mm diameter x 18 mm length balloon expandable stent, Herculink stent   There were no post operative problems or complications related to the surgery.    The patient denies interval amaurosis fugax. There is no recent history of TIA symptoms or focal motor deficits. There is no prior documented CVA.  The patient denies headache.  The patient is taking enteric-coated aspirin 81 mg daily.  The patient has a history of coronary artery disease, no recent episodes of angina or shortness of breath. The patient denies PAD or claudication symptoms. There is a history of hyperlipidemia which is being treated with a statin.     No outpatient medications have been marked as taking for the 09/26/17 encounter (Appointment) with Delana Meyer, Dolores Lory, MD.    Past Medical History:  Diagnosis Date  . Allergy   . Anemia   . Arthritis   . Colitis   . Complication of anesthesia    nausea  . Environmental and seasonal allergies   . GERD (gastroesophageal reflux disease)   . Hyperlipidemia   . Hypertension   . Myocardial infarction ([place])   . Osteoporosis   . Thyroid disease   . TIA (transient ischemic attack)     Past  Surgical History:  Procedure Laterality Date  . ABDOMINAL HYSTERECTOMY  2007  . BREAST ENHANCEMENT SURGERY  2004  . BREAST SURGERY  1984   Fibrocystic Disease  . CAROTID ENDARTERECTOMY Left 06/21/2014   Dr. Delana Meyer  . CAROTID ENDARTERECTOMY Right 08/02/2014   Dr. Delana Meyer  . CAROTID PTA/STENT INTERVENTION N/A 06/15/2017   Procedure: CAROTID PTA/STENT INTERVENTION;  Surgeon: Katha Cabal, MD;  Location: [facility];  Service: Cardiovascular;  Laterality: N/A;  . COLONOSCOPY WITH PROPOFOL N/A 07/14/2015   Procedure: COLONOSCOPY WITH PROPOFOL;  Surgeon: Hulen Luster, MD;  Location: [facility] END[facility]COPY;  Service: Gastroenterology;  Laterality: N/A;  . CORONARY ANGIOPLASTY WITH STENT PLACEMENT  1999  . FEMORAL ARTERY STENT  08/2003  . HERNIA REPAIR  2008  . RENAL ARTERY STENT  08/2003    Social History Social History   Tobacco Use  . Smoking status: Former Smoker    Last attempt to quit: 05/30/1994    Years since quitting: 23.3  . Smokeless tobacco: Never Used  Substance Use Topics  . Alcohol use: Yes    Comment: rare  . Drug use: No    Family History Family History  Problem Relation Age of Onset  . Hyperlipidemia Mother   . Congestive Heart Failure Mother   . Pancreatic cancer Father   . Arthritis Sister   . Alzheimer's disease Sister   . Prostate cancer Brother   . Heart attack Brother   . Stomach cancer Brother   . Kidney failure Brother   .  Leukemia Brother   . Emphysema Brother     Allergies  Allergen Reactions  . Naproxen Hives, Shortness Of Breath, Nausea And Vomiting and Swelling  . Penicillins Shortness Of Breath, Swelling and Rash    Has patient had a PCN reaction causing immediate rash, facial/tongue/throat swelling, SOB or lightheadedness with hypotension: Yes Has patient had a PCN reaction causing severe rash involving mucus membranes or skin necrosis: Yes Has patient had a PCN reaction that required hospitalization: Yes Has patient had a PCN  reaction occurring within the last 10 years: No  If all of the above answers are "NO", then may proceed with Cephalosporin use.   . Codeine Nausea And Vomiting  . Levofloxacin Nausea And Vomiting  . Shellfish Allergy Hives, Diarrhea, Nausea And Vomiting and Swelling     REVIEW OF SYSTEMS (Negative unless checked)  Constitutional: [] Weight loss  [] Fever  [] Chills Cardiac: [] Chest pain   [] Chest pressure   [] Palpitations   [] Shortness of breath when laying flat   [] Shortness of breath with exertion. Vascular:  [] Pain in legs with walking   [] Pain in legs at rest  [] History of DVT   [] Phlebitis   [] Swelling in legs   [] Varicose veins   [] Non-healing ulcers Pulmonary:   [] Uses home oxygen   [] Productive cough   [] Hemoptysis   [] Wheeze  [] COPD   [] Asthma Neurologic:  [] Dizziness   [] Seizures   [] History of stroke   [] History of TIA  [] Aphasia   [] Vissual changes   [] Weakness or numbness in arm   [] Weakness or numbness in leg Musculoskeletal:   [] Joint swelling   [] Joint pain   [] Low back pain Hematologic:  [] Easy bruising  [] Easy bleeding   [] Hypercoagulable state   [] Anemic Gastrointestinal:  [] Diarrhea   [] Vomiting  [] Gastroesophageal reflux/heartburn   [] Difficulty swallowing. Genitourinary:  [] Chronic kidney disease   [] Difficult urination  [] Frequent urination   [] Blood in urine Skin:  [] Rashes   [] Ulcers  Psychological:  [] History of anxiety   []  History of major depression.  Physical Examination  There were no vitals filed for this visit. There is no height or weight on file to calculate BMI. Gen: WD/WN, NAD Head: [place]/AT, No temporalis wasting.  Ear/Nose/Throat: Hearing grossly intact, nares w/o erythema or drainage Eyes: PER, EOMI, sclera nonicteric.  Neck: Supple, no large masses.   Pulmonary:  Good air movement, no audible wheezing bilaterally, no use of accessory muscles.  Cardiac: RRR, no JVD Vascular: Bilateral carotid and supraclavicular bruits noted v Vessel Right Left    Radial Palpable Palpable  Ulnar Palpable Palpable  Brachial Palpable Palpable  Carotid Palpable Palpable  Gastrointestinal: Non-distended. No guarding/no peritoneal signs.  Musculoskeletal: M/S 5/5 throughout.  No deformity or atrophy.  Neurologic: CN 2-12 intact. Symmetrical.  Speech is fluent. Motor exam as listed above. Psychiatric: Judgment intact, Mood & affect appropriate for pt's clinical situation. Dermatologic: No rashes or ulcers noted.  No changes consistent with cellulitis. Lymph : No lichenification or skin changes of chronic lymphedema.  CBC Lab Results  Component Value Date   WBC 10.6 06/16/2017   HGB 12.1 06/16/2017   HCT 35.7 06/16/2017   MCV 94.4 06/16/2017   PLT 272 06/16/2017    BMET    Component Value Date/Time   NA 137 06/16/2017 0628   NA 140 06/28/2016 0909   NA 140 06/22/2014 0417   K 3.4 (L) 06/16/2017 0628   K 3.5 06/22/2014 0417   CL 104 06/16/2017 0628   CL 105 06/22/2014 0417  CO2 24 06/16/2017 0628   CO2 27 06/22/2014 0417   GLUCOSE 145 (H) 06/16/2017 0628   GLUCOSE 92 06/22/2014 0417   BUN 11 06/16/2017 0628   BUN 16 06/28/2016 0909   BUN 9 06/22/2014 0417   CREATININE 0.67 06/16/2017 0628   CREATININE 0.65 06/22/2014 0417   CALCIUM 8.1 (L) 06/16/2017 0628   CALCIUM 7.7 (L) 06/22/2014 0417   GFRNONAA >60 06/16/2017 0628   GFRNONAA >60 06/22/2014 0417   GFRNONAA >60 02/26/2013 1510   GFRAA >60 06/16/2017 0628   GFRAA >60 06/22/2014 0417   GFRAA >60 02/26/2013 1510   CrCl cannot be calculated (Patient's most recent lab result is older than the maximum 21 days allowed.).  COAG Lab Results  Component Value Date   INR 1.0 06/22/2014    Radiology No results found.   Assessment/Plan 1. Bilateral carotid artery stenosis Recommend:  Given the patient's asymptomatic subcritical stenosis no further invasive testing or surgery at this time.  Duplex ultrasound shows <70% stenosis bilaterally.  Innominate artery has triphasic  flow.  Continue antiplatelet therapy as prescribed Continue management of CAD, HTN and Hyperlipidemia Healthy heart diet,  encouraged exercise at least 4 times per week Follow up in 12 months with duplex ultrasound and physical exam based on <70% stenosis of the bilateral carotid artery   - VAS US CAROTID; Future  2. Innominate artery stenosis (HCC) See #1 - VAS US CAROTID; Future  3. Healed myocardial infarct Continue cardiac and antihypertensive medications as already ordered and reviewed, no changes at this time.  Continue statin as ordered and reviewed, no changes at this time  Nitrates PRN for chest pain   4. Essential (primary) hypertension Continue antihypertensive medications as already ordered, these medications have been reviewed and there are no changes at this time.   5. Hypercholesteremia Continue statin as ordered and reviewed, no changes at this time    Hortencia Pilar, MD  09/26/2017 1:25 PM

## 2017-09-28 DIAGNOSIS — E042 Nontoxic multinodular goiter: Secondary | ICD-10-CM | POA: Diagnosis not present

## 2017-09-29 DIAGNOSIS — E042 Nontoxic multinodular goiter: Secondary | ICD-10-CM | POA: Diagnosis not present

## 2017-09-30 ENCOUNTER — Encounter (INDEPENDENT_AMBULATORY_CARE_PROVIDER_SITE_OTHER): Payer: Self-pay | Admitting: Vascular Surgery

## 2017-10-17 ENCOUNTER — Other Ambulatory Visit (INDEPENDENT_AMBULATORY_CARE_PROVIDER_SITE_OTHER): Payer: Self-pay | Admitting: Vascular Surgery

## 2017-11-02 ENCOUNTER — Ambulatory Visit (INDEPENDENT_AMBULATORY_CARE_PROVIDER_SITE_OTHER): Payer: PPO | Admitting: Family Medicine

## 2017-11-02 ENCOUNTER — Encounter: Payer: Self-pay | Admitting: Family Medicine

## 2017-11-02 VITALS — BP 122/66 | HR 106 | Temp 103.0°F | Resp 16 | Wt 137.6 lb

## 2017-11-02 DIAGNOSIS — L03115 Cellulitis of right lower limb: Secondary | ICD-10-CM | POA: Diagnosis not present

## 2017-11-02 MED ORDER — CLINDAMYCIN HCL 300 MG PO CAPS
300.0000 mg | ORAL_CAPSULE | Freq: Four times a day (QID) | ORAL | 0 refills | Status: DC
Start: 2017-11-02 — End: 2017-11-08

## 2017-11-02 NOTE — Progress Notes (Signed)
  Subjective:     Patient ID: Misty Page, female   DOB: March 14, 1941, 77 y.o.   MRN: [id number] Chief Complaint  Patient presents with  . Animal Bite    Patient comes in office today with complaints of swelling of rightknee and ankles for the past 48hrs. Patient reports that she was scratched on the back of her leg by her late husband's cat.. Patient reports that skin was swollen and weeping fluid for a few days prior to the scratch.Patient also reports symptoms of fever, chills and nausea in the last 24 hours.   HPI States she has been using her pressure stockings and elevation to reduce swelling. "I have had cellulitis in this leg before." Review of Systems     Objective:   Physical Exam  Constitutional: She appears well-developed and well-nourished. No distress.  Cardiovascular:  Pulses:      Dorsalis pedis pulses are 2+ on the right side.       Posterior tibial pulses are 2+ on the right side.  Musculoskeletal: She exhibits no edema (pedal).  Skin: There is erythema (proximal left lower leg with mild tenderness and swelling).  Small scratch to distal aspect of her right lower leg without drainage or surrounding erythema.       Assessment:    1. Cellulitis of right lower extremity: start clindamycin, warm compresses and elevation    Plan:    F/u with her primary M.D., Dr. B, in 48 hours.

## 2017-11-02 NOTE — Patient Instructions (Signed)
Apply warm compresses and keep leg elevated. Return in 48 hours for recheck.

## 2017-11-04 ENCOUNTER — Ambulatory Visit (INDEPENDENT_AMBULATORY_CARE_PROVIDER_SITE_OTHER): Payer: PPO | Admitting: Family Medicine

## 2017-11-04 ENCOUNTER — Encounter: Payer: Self-pay | Admitting: Family Medicine

## 2017-11-04 VITALS — BP 122/54 | HR 83 | Temp 98.3°F | Resp 16 | Wt 135.0 lb

## 2017-11-04 DIAGNOSIS — L03115 Cellulitis of right lower limb: Secondary | ICD-10-CM | POA: Diagnosis not present

## 2017-11-04 DIAGNOSIS — Z23 Encounter for immunization: Secondary | ICD-10-CM | POA: Diagnosis not present

## 2017-11-04 DIAGNOSIS — M75111 Incomplete rotator cuff tear or rupture of right shoulder, not specified as traumatic: Secondary | ICD-10-CM | POA: Diagnosis not present

## 2017-11-04 DIAGNOSIS — S81811D Laceration without foreign body, right lower leg, subsequent encounter: Secondary | ICD-10-CM

## 2017-11-04 DIAGNOSIS — W5503XA Scratched by cat, initial encounter: Secondary | ICD-10-CM

## 2017-11-04 DIAGNOSIS — M7581 Other shoulder lesions, right shoulder: Secondary | ICD-10-CM | POA: Diagnosis not present

## 2017-11-04 NOTE — Patient Instructions (Signed)

## 2017-11-04 NOTE — Progress Notes (Signed)
Patient: Misty Page Female    DOB: 01/17/1941   77 y.o.   MRN: [id number] Visit Date: 11/04/2017  Today's Provider: Lavon Paganini, MD   I, Martha Clan, CMA, am acting as scribe for Lavon Paganini, MD.  Chief Complaint  Patient presents with  . Cellulitis   Subjective:    HPI     Follow up for Cellulitis  The patient was last seen for this 2 days ago. Pt saw Mikki Santee at that time. Changes made at last visit include adding Clindamycin, applying warm compresses and keeping the leg elevated.  Clindamycin is giving her nausea, but she denies vomiting or diarrhea.  Her fever has resolved.  This was due to cat scratch.  She reports good compliance with treatment. She feels that condition is Improved.  States that it initially got worse after appt but now getting better. She is having side effects. Malaise, nausea.  ------------------------------------------------------------------------------------    Allergies  Allergen Reactions  . Naproxen Hives, Shortness Of Breath, Nausea And Vomiting and Swelling  . Penicillins Shortness Of Breath, Swelling and Rash    Has patient had a PCN reaction causing immediate rash, facial/tongue/throat swelling, SOB or lightheadedness with hypotension: Yes Has patient had a PCN reaction causing severe rash involving mucus membranes or skin necrosis: Yes Has patient had a PCN reaction that required hospitalization: Yes Has patient had a PCN reaction occurring within the last 10 years: No  If all of the above answers are "NO", then may proceed with Cephalosporin use.   . Codeine Nausea And Vomiting  . Levofloxacin Nausea And Vomiting  . Shellfish Allergy Hives, Diarrhea, Nausea And Vomiting and Swelling     Current Outpatient Medications:  .  Ascorbic Acid (VITAMIN C) 1000 MG tablet, Take 500 mg by mouth daily. , Disp: , Rfl:  .  aspirin 81 MG tablet, Take 81 mg by mouth daily. ASPIRIN, 81MG (Oral Tablet)  1 Every Day for 0 days   Quantity: 0.00;  Refills: 0   Ordered :13-Apr-2010  Celene Kras, MA, Anastasiya ;  Started 29-October-2008 Active Comments: DX: 414.00, Disp: , Rfl:  .  B COMPLEX VITAMINS PO, Take 1 tablet by mouth daily. B COMPLEX (Oral Capsule)  1 Every Day for 0 days  Quantity: 0.00;  Refills: 0   Ordered :13-Apr-2010  Celene Kras, MA, Anastasiya ;  Started 29-October-2008 Active Comments: DX: 414.00, Disp: , Rfl:  .  Calcium-Vitamin D-Vitamin K 500-100-40 MG-UNT-MCG CHEW, Chew 1 tablet by mouth 2 (two) times daily. VIACTIV, 500-100-40 (Oral Tablet Chewable)  1 tablet twice a day for 0 days  Quantity: 0.00;  Refills: 0   Ordered :13-Apr-2010  Celene Kras, MA, Anastasiya ;  Started 29-October-2008 Active Comments: DX: 733.00, Disp: , Rfl:  .  clindamycin (CLEOCIN) 300 MG capsule, Take 1 capsule (300 mg total) by mouth 4 (four) times daily., Disp: 28 capsule, Rfl: 0 .  clopidogrel (PLAVIX) 75 MG tablet, Take 1 tablet (75 mg total) by mouth daily., Disp: 30 tablet, Rfl: 2 .  Coenzyme Q10 (COQ10) 200 MG CAPS, Take 1 capsule by mouth at bedtime., Disp: , Rfl:  .  Cranberry 300 MG tablet, Take 300 mg by mouth daily. , Disp: , Rfl:  .  famotidine (PEPCID) 40 MG tablet, TAKE ONE (1) TABLET BY MOUTH EVERY DAY, Disp: 90 tablet, Rfl: 1 .  ferrous sulfate 325 (65 FE) MG EC tablet, Take 325 mg by mouth daily. FERROUS SULFATE, 325 (65 Fe)MG (Oral Tablet Delayed Release)  1  every day for 0 days  Quantity: 0.00;  Refills: 0   Ordered :13-Apr-2010  Celene Kras, MA, Anastasiya ;  Started 29-October-2008 Active Comments: DX: 285.9, Disp: , Rfl:  .  fexofenadine (ALLEGRA) 180 MG tablet, Take 180 mg by mouth at bedtime. , Disp: , Rfl:  .  fluticasone (FLONASE) 50 MCG/ACT nasal spray, USE 2 SPRAYS EACH NOSTRIL DAILY, Disp: 48 g, Rfl: 1 .  hydrochlorothiazide (HYDRODIURIL) 25 MG tablet, TAKE ONE (1) TABLET EACH DAY, Disp: 90 tablet, Rfl: 3 .  losartan (COZAAR) 100 MG tablet, TAKE ONE (1) TABLET EACH DAY, Disp: 90 tablet, Rfl: 2 .  Mesalamine (ASACOL  HD) 800 MG TBEC, TAKE 2 TABLETS BY MOUTH TWICE A DAY., Disp: , Rfl:  .  methimazole (TAPAZOLE) 5 MG tablet, Take 2.5 mg by mouth. , Disp: , Rfl:  .  montelukast (SINGULAIR) 10 MG tablet, TAKE ONE TABLET AT BEDTIME, Disp: 90 tablet, Rfl: 2 .  Multiple Vitamins-Minerals (CENTRUM SILVER PO), Take 1 capsule by mouth daily., Disp: , Rfl:  .  Omega-3 Fatty Acids (FISH OIL) 1000 MG CAPS, Take 1 capsule by mouth daily. FISH OIL CONCENTRATE, 1000MG (Oral Capsule)  1 Every Day for 0 days  Quantity: 0.00;  Refills: 0   Ordered :13-Apr-2010  Celene Kras, MA, Anastasiya ;  Started 29-October-2008 Active Comments: DX: 414.00, Disp: , Rfl:  .  rosuvastatin (CRESTOR) 20 MG tablet, Take 0.5 tablets (10 mg total) by mouth daily., Disp: 45 tablet, Rfl: 2 .  vitamin E 400 UNIT capsule, Take 400 Units by mouth daily., Disp: , Rfl:  .  terbinafine (LAMISIL) 250 MG tablet, Take by mouth., Disp: , Rfl:  .  triamcinolone cream (KENALOG) 0.1 %, , Disp: , Rfl:   Review of Systems  Constitutional: Positive for activity change, appetite change, chills, diaphoresis and fatigue. Negative for fever and unexpected weight change.  HENT: Negative.   Respiratory: Negative.   Cardiovascular: Negative for chest pain, palpitations and leg swelling.  Gastrointestinal: Negative.   Genitourinary: Negative.   Musculoskeletal: Negative.   Skin: Positive for wound.  Neurological: Negative.   Hematological: Negative.   Psychiatric/Behavioral: Negative.     Social History   Tobacco Use  . Smoking status: Former Smoker    Last attempt to quit: 05/30/1994    Years since quitting: 23.4  . Smokeless tobacco: Never Used  Substance Use Topics  . Alcohol use: Yes    Comment: rare   Objective:   BP (!) 122/54 (BP Location: Left Arm, Patient Position: Sitting, Cuff Size: Normal)   Pulse 83   Temp 98.3 F (36.8 C) (Oral)   Resp 16   Wt 135 lb (61.2 kg)   SpO2 98%   BMI 25.51 kg/m  Vitals:   11/04/17 0806  BP: (!) 122/54  Pulse:  83  Resp: 16  Temp: 98.3 F (36.8 C)  TempSrc: Oral  SpO2: 98%  Weight: 135 lb (61.2 kg)     Physical Exam  Constitutional: She is oriented to person, place, and time. She appears well-developed and well-nourished. No distress.  HENT:  Head: Normocephalic and atraumatic.  Eyes: Conjunctivae are normal. No scleral icterus.  Neck: Neck supple.  Cardiovascular: Normal rate, regular rhythm and intact distal pulses.  Murmur heard. Pulmonary/Chest: Effort normal and breath sounds normal. No respiratory distress. She has no wheezes. She has no rales.  Abdominal: Soft. She exhibits no distension. There is no tenderness.  Musculoskeletal: She exhibits no edema or deformity.  Lymphadenopathy:    She has  no cervical adenopathy.       Right: No inguinal adenopathy present.       Left: No inguinal adenopathy present.  Neurological: She is alert and oriented to person, place, and time.  Skin: Skin is warm and dry. Capillary refill takes less than 2 seconds. Rash noted. There is erythema.  Laceration on R posterior calf x2.  Erythema of mid calf to knee (see picture)  Psychiatric: She has a normal mood and affect. Her behavior is normal.  Vitals reviewed.        Assessment & Plan:  1. Cellulitis of right lower extremity 2. Cat scratch 3. Laceration of right lower extremity, subsequent encounter -Patient with cellulitis of right lower extremity after cat scratch from her house cat that is up-to-date on vaccinations - He is not up-to-date on her tetanus vaccination, so this was given today Though she still has significant warmth and redness of her right leg, she insists that this is looking better than it was 2 days ago - Given that it is improving, we will continue clindamycin at current dose to finish 7-day course -She will call early next week if this is not completely resolved we can consider switching antibiotics or extending the course if needed -Bactrim or doxycycline would be  appropriate choices of antibiotic if we needed to switch -No current signs of systemic illness, but discussed with patient that she is at risk of this and what to watch out for -Strict return precautions discussed - Td vaccine greater than or equal to 7yo preservative free IM   Return if symptoms worsen or fail to improve.   The entirety of the information documented in the History of Present Illness, Review of Systems and Physical Exam were personally obtained by me. Portions of this information were initially documented by Raquel Sarna Ratchford, CMA and reviewed by me for thoroughness and accuracy.    Virginia Crews, MD, MPH [facility] 11/04/2017 8:55 AM

## 2017-11-08 ENCOUNTER — Other Ambulatory Visit: Payer: Self-pay | Admitting: Family Medicine

## 2017-11-08 ENCOUNTER — Telehealth: Payer: Self-pay | Admitting: Family Medicine

## 2017-11-08 NOTE — Telephone Encounter (Signed)
Pt was in last week with Colitis and she said she was supposed to call back and let you know how she was doing.  She states she is better but feels like she needs additional medicine. She feels she needs more days to completely get better  Her call back is [phone]  Thanks teri

## 2017-11-08 NOTE — Telephone Encounter (Signed)
For your review

## 2017-11-08 NOTE — Telephone Encounter (Signed)
Please review

## 2017-11-09 NOTE — Telephone Encounter (Signed)
Pt advised.

## 2017-11-09 NOTE — Telephone Encounter (Signed)
I sent 3 more days of Clindamycin for her Cellulitis (not colitis).  If this is still not completely resolved, needs f/u appt to reassess and may need a different abx.  Virginia Crews, MD, MPH [facility] 11/09/2017 9:02 AM

## 2017-12-16 ENCOUNTER — Ambulatory Visit: Payer: PPO | Admitting: Family Medicine

## 2017-12-16 ENCOUNTER — Encounter: Payer: Self-pay | Admitting: Family Medicine

## 2017-12-16 VITALS — BP 144/70 | HR 80 | Temp 98.3°F | Resp 16 | Wt 135.0 lb

## 2017-12-16 DIAGNOSIS — N3091 Cystitis, unspecified with hematuria: Secondary | ICD-10-CM

## 2017-12-16 DIAGNOSIS — K51911 Ulcerative colitis, unspecified with rectal bleeding: Secondary | ICD-10-CM | POA: Diagnosis not present

## 2017-12-16 DIAGNOSIS — R1030 Lower abdominal pain, unspecified: Secondary | ICD-10-CM | POA: Diagnosis not present

## 2017-12-16 LAB — POCT URINALYSIS DIPSTICK
Appearance: NORMAL
Bilirubin, UA: NEGATIVE
GLUCOSE UA: NEGATIVE
KETONES UA: NEGATIVE
Nitrite, UA: NEGATIVE
Odor: NORMAL
Protein, UA: NEGATIVE
SPEC GRAV UA: 1.02 (ref 1.010–1.025)
Urobilinogen, UA: 0.2 E.U./dL
pH, UA: 6 (ref 5.0–8.0)

## 2017-12-16 MED ORDER — NITROFURANTOIN MONOHYD MACRO 100 MG PO CAPS: 100.0000 mg | ORAL_CAPSULE | Freq: Two times a day (BID) | ORAL | 0 refills | Status: AC

## 2017-12-16 NOTE — Progress Notes (Signed)
Patient: Misty Page Female    DOB: 29-Jul-1940   77 y.o.   MRN: [id number] Visit Date: 12/16/2017  Today's Provider: Lavon Paganini, MD   I, Martha Clan, CMA, am acting as scribe for Lavon Paganini, MD.  Chief Complaint  Patient presents with  . Abdominal Pain   Subjective:    Abdominal Pain  This is a new problem. Episode onset: more than 1 week ago. The problem has been gradually worsening. Pain location: lower abd pain. The pain is mild. The quality of the pain is cramping. The abdominal pain does not radiate. Associated symptoms include diarrhea (UC), flatus (UC) and hematochezia (UC). Pertinent negatives include no anorexia, arthralgias, belching, constipation, dysuria, fever, frequency, headaches, hematuria, melena, myalgias, nausea, vomiting or weight loss. Associated symptoms comments: She is c/o incomplete bladder emptying and incontinence.. Exacerbated by: certain foods. Relieved by: resting. Her past medical history is significant for ulcerative colitis (pt is unsure if the pain is from UC vs cystitis. She is requesting a UA today.).       Allergies  Allergen Reactions  . Naproxen Hives, Shortness Of Breath, Nausea And Vomiting and Swelling  . Penicillins Shortness Of Breath, Swelling and Rash    Has patient had a PCN reaction causing immediate rash, facial/tongue/throat swelling, SOB or lightheadedness with hypotension: Yes Has patient had a PCN reaction causing severe rash involving mucus membranes or skin necrosis: Yes Has patient had a PCN reaction that required hospitalization: Yes Has patient had a PCN reaction occurring within the last 10 years: No  If all of the above answers are "NO", then may proceed with Cephalosporin use.   . Codeine Nausea And Vomiting  . Levofloxacin Nausea And Vomiting  . Shellfish Allergy Hives, Diarrhea, Nausea And Vomiting and Swelling     Current Outpatient Medications:  .  Ascorbic Acid (VITAMIN C) 1000 MG tablet,  Take 500 mg by mouth daily. , Disp: , Rfl:  .  aspirin 81 MG tablet, Take 81 mg by mouth daily. ASPIRIN, 81MG (Oral Tablet)  1 Every Day for 0 days  Quantity: 0.00;  Refills: 0   Ordered :13-Apr-2010  Celene Kras, MA, Anastasiya ;  Started 29-October-2008 Active Comments: DX: 414.00, Disp: , Rfl:  .  B COMPLEX VITAMINS PO, Take 1 tablet by mouth daily. B COMPLEX (Oral Capsule)  1 Every Day for 0 days  Quantity: 0.00;  Refills: 0   Ordered :13-Apr-2010  Celene Kras, MA, Anastasiya ;  Started 29-October-2008 Active Comments: DX: 414.00, Disp: , Rfl:  .  Calcium-Vitamin D-Vitamin K 500-100-40 MG-UNT-MCG CHEW, Chew 1 tablet by mouth 2 (two) times daily. VIACTIV, 500-100-40 (Oral Tablet Chewable)  1 tablet twice a day for 0 days  Quantity: 0.00;  Refills: 0   Ordered :13-Apr-2010  Celene Kras, MA, Anastasiya ;  Started 29-October-2008 Active Comments: DX: 733.00, Disp: , Rfl:  .  clopidogrel (PLAVIX) 75 MG tablet, Take 1 tablet (75 mg total) by mouth daily., Disp: 30 tablet, Rfl: 2 .  Coenzyme Q10 (COQ10) 200 MG CAPS, Take 1 capsule by mouth at bedtime., Disp: , Rfl:  .  Cranberry 300 MG tablet, Take 300 mg by mouth daily. , Disp: , Rfl:  .  famotidine (PEPCID) 40 MG tablet, TAKE ONE (1) TABLET BY MOUTH EVERY DAY, Disp: 90 tablet, Rfl: 1 .  ferrous sulfate 325 (65 FE) MG EC tablet, Take 325 mg by mouth daily. FERROUS SULFATE, 325 (65 Fe)MG (Oral Tablet Delayed Release)  1 every day for 0  days  Quantity: 0.00;  Refills: 0   Ordered :13-Apr-2010  Celene Kras, MA, Anastasiya ;  Started 29-October-2008 Active Comments: DX: 285.9, Disp: , Rfl:  .  fexofenadine (ALLEGRA) 180 MG tablet, Take 180 mg by mouth at bedtime. , Disp: , Rfl:  .  fluticasone (FLONASE) 50 MCG/ACT nasal spray, USE 2 SPRAYS EACH NOSTRIL DAILY, Disp: 48 g, Rfl: 1 .  hydrochlorothiazide (HYDRODIURIL) 25 MG tablet, TAKE ONE (1) TABLET EACH DAY, Disp: 90 tablet, Rfl: 3 .  losartan (COZAAR) 100 MG tablet, TAKE ONE (1) TABLET EACH DAY, Disp: 90 tablet, Rfl: 2 .   Mesalamine (ASACOL HD) 800 MG TBEC, TAKE 2 TABLETS BY MOUTH TWICE A DAY., Disp: , Rfl:  .  methimazole (TAPAZOLE) 5 MG tablet, Take 2.5 mg by mouth. , Disp: , Rfl:  .  montelukast (SINGULAIR) 10 MG tablet, TAKE ONE TABLET AT BEDTIME, Disp: 90 tablet, Rfl: 2 .  Multiple Vitamins-Minerals (CENTRUM SILVER PO), Take 1 capsule by mouth daily., Disp: , Rfl:  .  Omega-3 Fatty Acids (FISH OIL) 1000 MG CAPS, Take 1 capsule by mouth daily. FISH OIL CONCENTRATE, 1000MG (Oral Capsule)  1 Every Day for 0 days  Quantity: 0.00;  Refills: 0   Ordered :13-Apr-2010  Celene Kras, MA, Anastasiya ;  Started 29-October-2008 Active Comments: DX: 414.00, Disp: , Rfl:  .  rosuvastatin (CRESTOR) 20 MG tablet, Take 0.5 tablets (10 mg total) by mouth daily., Disp: 45 tablet, Rfl: 2 .  vitamin E 400 UNIT capsule, Take 400 Units by mouth daily., Disp: , Rfl:  .  nitrofurantoin, macrocrystal-monohydrate, (MACROBID) 100 MG capsule, Take 1 capsule (100 mg total) by mouth 2 (two) times daily for 7 days., Disp: 14 capsule, Rfl: 0 .  triamcinolone cream (KENALOG) 0.1 %, , Disp: , Rfl:   Review of Systems  Constitutional: Negative for fever and weight loss.  Gastrointestinal: Positive for abdominal pain, diarrhea (UC), flatus (UC) and hematochezia (UC). Negative for anorexia, constipation, melena, nausea and vomiting.  Genitourinary: Negative for dysuria, frequency and hematuria.  Musculoskeletal: Negative for arthralgias and myalgias.  Neurological: Negative for headaches.    Social History   Tobacco Use  . Smoking status: Former Smoker    Last attempt to quit: 05/30/1994    Years since quitting: 23.5  . Smokeless tobacco: Never Used  Substance Use Topics  . Alcohol use: Yes    Comment: rare   Objective:   BP (!) 144/70 (BP Location: Right Arm, Patient Position: Sitting, Cuff Size: Normal)   Pulse 80   Temp 98.3 F (36.8 C) (Oral)   Resp 16   Wt 135 lb (61.2 kg)   BMI 25.51 kg/m  Vitals:   12/16/17 0901  BP: (!)  144/70  Pulse: 80  Resp: 16  Temp: 98.3 F (36.8 C)  TempSrc: Oral  Weight: 135 lb (61.2 kg)     Physical Exam  Constitutional: She is oriented to person, place, and time. She appears well-developed and well-nourished. She does not appear ill. No distress.  HENT:  Head: Normocephalic and atraumatic.  Mouth/Throat: Oropharynx is clear and moist.  Eyes: No scleral icterus.  Cardiovascular: Normal rate, regular rhythm and intact distal pulses.  Murmur heard. Pulmonary/Chest: Effort normal and breath sounds normal. No respiratory distress. She has no wheezes. She has no rales.  Abdominal: Soft. Normal appearance. Bowel sounds are increased. There is tenderness in the left lower quadrant. There is no rigidity, no rebound, no guarding and no CVA tenderness.  Musculoskeletal: She exhibits no edema.  Neurological: She is alert and oriented to person, place, and time.  Skin: Skin is warm and dry. Capillary refill takes less than 2 seconds. No rash noted.  Psychiatric: She has a normal mood and affect. Her behavior is normal.  Vitals reviewed.   Results for orders placed or performed in visit on 12/16/17  POCT urinalysis dipstick  Result Value Ref Range   Color, UA yellow    Clarity, UA clear    Glucose, UA Negative Negative   Bilirubin, UA negative    Ketones, UA negative    Spec Grav, UA 1.020 1.010 - 1.025   Blood, UA non-hemolyzed moderate    pH, UA 6.0 5.0 - 8.0   Protein, UA Negative Negative   Urobilinogen, UA 0.2 0.2 or 1.0 E.U./dL   Nitrite, UA negative    Leukocytes, UA Moderate (2+) (A) Negative   Appearance normal    Odor normal        Assessment & Plan:   1. Lower abdominal pain - suspect related to UC flare and UTI -No rebound or guarding or other concerning findings -As below, she reports that her GI knows about her UC flare - POCT urinalysis dipstick  2. Cystitis with hematuria -UA consistent with cystitis -Review of records reveals that most recent UTIs  have been enterococcus that was pansensitive -She has been treated with Macrobid in the past with good relief of symptoms -Discussed proper hygiene especially in setting of UC flare - Given that patient has had enterococcus UTIs and has a penicillin allergy with anaphylaxis, will treat her with Macrobid twice daily x7 days even though I do not typically use Macrobid in elderly patients -Send urine culture and microscopy -Discussed return precautions - Urine Culture - Urinalysis, microscopic only  3. Ulcerative colitis with rectal bleeding, unspecified location [facility]) -With current flare and rectal bleeding -Patient states that her GI is aware of her flare -She is taking increased dose of her mesalamine -Suspect that her abdominal pain is related to this, but no red flag signs on exam    Meds ordered this encounter  Medications  . nitrofurantoin, macrocrystal-monohydrate, (MACROBID) 100 MG capsule    Sig: Take 1 capsule (100 mg total) by mouth 2 (two) times daily for 7 days.    Dispense:  14 capsule    Refill:  0     Return if symptoms worsen or fail to improve.   The entirety of the information documented in the History of Present Illness, Review of Systems and Physical Exam were personally obtained by me. Portions of this information were initially documented by Raquel Sarna Ratchford, CMA and reviewed by me for thoroughness and accuracy.    Virginia Crews, MD, MPH [facility] 12/16/2017 10:13 AM

## 2017-12-16 NOTE — Patient Instructions (Signed)

## 2017-12-17 LAB — URINALYSIS, MICROSCOPIC ONLY: CASTS: NONE SEEN /LPF

## 2017-12-20 ENCOUNTER — Telehealth: Payer: Self-pay

## 2017-12-20 LAB — URINE CULTURE

## 2017-12-20 NOTE — Telephone Encounter (Signed)
-----   Message from Virginia Crews, MD sent at 12/20/2017 11:39 AM EDT ----- Urine culture confirms UTI from Enterococcus that is sensitive to antibiotic prescribed.  Hope that she is doing better.  Virginia Crews, MD, MPH [facility] 12/20/2017 11:39 AM

## 2017-12-20 NOTE — Telephone Encounter (Signed)
Pt advised. States she is feeling better.

## 2017-12-30 ENCOUNTER — Other Ambulatory Visit: Payer: Self-pay | Admitting: Physician Assistant

## 2017-12-30 DIAGNOSIS — K219 Gastro-esophageal reflux disease without esophagitis: Secondary | ICD-10-CM

## 2018-01-02 DIAGNOSIS — I2510 Atherosclerotic heart disease of native coronary artery without angina pectoris: Secondary | ICD-10-CM | POA: Diagnosis not present

## 2018-01-02 DIAGNOSIS — E782 Mixed hyperlipidemia: Secondary | ICD-10-CM | POA: Diagnosis not present

## 2018-01-02 DIAGNOSIS — I350 Nonrheumatic aortic (valve) stenosis: Secondary | ICD-10-CM | POA: Diagnosis not present

## 2018-01-02 DIAGNOSIS — I10 Essential (primary) hypertension: Secondary | ICD-10-CM | POA: Diagnosis not present

## 2018-01-13 ENCOUNTER — Other Ambulatory Visit (INDEPENDENT_AMBULATORY_CARE_PROVIDER_SITE_OTHER): Payer: Self-pay | Admitting: Vascular Surgery

## 2018-01-16 DIAGNOSIS — D649 Anemia, unspecified: Secondary | ICD-10-CM | POA: Diagnosis not present

## 2018-01-16 DIAGNOSIS — D509 Iron deficiency anemia, unspecified: Secondary | ICD-10-CM | POA: Diagnosis not present

## 2018-01-16 DIAGNOSIS — K625 Hemorrhage of anus and rectum: Secondary | ICD-10-CM | POA: Diagnosis not present

## 2018-01-16 DIAGNOSIS — Z8719 Personal history of other diseases of the digestive system: Secondary | ICD-10-CM | POA: Diagnosis not present

## 2018-01-24 DIAGNOSIS — D649 Anemia, unspecified: Secondary | ICD-10-CM | POA: Diagnosis not present

## 2018-01-24 DIAGNOSIS — K625 Hemorrhage of anus and rectum: Secondary | ICD-10-CM | POA: Diagnosis not present

## 2018-01-26 ENCOUNTER — Ambulatory Visit (INDEPENDENT_AMBULATORY_CARE_PROVIDER_SITE_OTHER): Payer: Self-pay | Admitting: Vascular Surgery

## 2018-01-26 ENCOUNTER — Encounter (INDEPENDENT_AMBULATORY_CARE_PROVIDER_SITE_OTHER): Payer: Self-pay

## 2018-02-07 ENCOUNTER — Ambulatory Visit (INDEPENDENT_AMBULATORY_CARE_PROVIDER_SITE_OTHER): Payer: PPO | Admitting: Vascular Surgery

## 2018-02-07 ENCOUNTER — Encounter: Payer: Self-pay | Admitting: *Deleted

## 2018-02-07 ENCOUNTER — Encounter (INDEPENDENT_AMBULATORY_CARE_PROVIDER_SITE_OTHER): Payer: PPO

## 2018-02-08 ENCOUNTER — Ambulatory Visit
Admission: RE | Admit: 2018-02-08 | Discharge: 2018-02-08 | Disposition: A | Discharge status: Home / Self Care | Payer: PPO | Source: Ambulatory Visit | Attending: Internal Medicine | Admitting: Internal Medicine

## 2018-02-08 ENCOUNTER — Ambulatory Visit: Payer: PPO | Admitting: Anesthesiology

## 2018-02-08 ENCOUNTER — Encounter
Admission: RE | Disposition: A | Discharge status: Home / Self Care | Payer: Self-pay | Source: Ambulatory Visit | Attending: Internal Medicine

## 2018-02-08 DIAGNOSIS — K649 Unspecified hemorrhoids: Secondary | ICD-10-CM | POA: Insufficient documentation

## 2018-02-08 DIAGNOSIS — E7800 Pure hypercholesterolemia, unspecified: Secondary | ICD-10-CM | POA: Diagnosis not present

## 2018-02-08 DIAGNOSIS — Z91013 Allergy to seafood: Secondary | ICD-10-CM | POA: Diagnosis not present

## 2018-02-08 DIAGNOSIS — M1990 Unspecified osteoarthritis, unspecified site: Secondary | ICD-10-CM | POA: Insufficient documentation

## 2018-02-08 DIAGNOSIS — I252 Old myocardial infarction: Secondary | ICD-10-CM | POA: Insufficient documentation

## 2018-02-08 DIAGNOSIS — Z8711 Personal history of peptic ulcer disease: Secondary | ICD-10-CM | POA: Diagnosis not present

## 2018-02-08 DIAGNOSIS — K219 Gastro-esophageal reflux disease without esophagitis: Secondary | ICD-10-CM | POA: Insufficient documentation

## 2018-02-08 DIAGNOSIS — K5190 Ulcerative colitis, unspecified, without complications: Secondary | ICD-10-CM | POA: Insufficient documentation

## 2018-02-08 DIAGNOSIS — E039 Hypothyroidism, unspecified: Secondary | ICD-10-CM | POA: Diagnosis not present

## 2018-02-08 DIAGNOSIS — Z881 Allergy status to other antibiotic agents status: Secondary | ICD-10-CM | POA: Insufficient documentation

## 2018-02-08 DIAGNOSIS — Z8673 Personal history of transient ischemic attack (TIA), and cerebral infarction without residual deficits: Secondary | ICD-10-CM | POA: Insufficient documentation

## 2018-02-08 DIAGNOSIS — K5790 Diverticulosis of intestine, part unspecified, without perforation or abscess without bleeding: Secondary | ICD-10-CM | POA: Diagnosis not present

## 2018-02-08 DIAGNOSIS — E785 Hyperlipidemia, unspecified: Secondary | ICD-10-CM | POA: Diagnosis not present

## 2018-02-08 DIAGNOSIS — Z955 Presence of coronary angioplasty implant and graft: Secondary | ICD-10-CM | POA: Diagnosis not present

## 2018-02-08 DIAGNOSIS — M810 Age-related osteoporosis without current pathological fracture: Secondary | ICD-10-CM | POA: Diagnosis not present

## 2018-02-08 DIAGNOSIS — Z79899 Other long term (current) drug therapy: Secondary | ICD-10-CM | POA: Diagnosis not present

## 2018-02-08 DIAGNOSIS — Z8719 Personal history of other diseases of the digestive system: Secondary | ICD-10-CM | POA: Diagnosis not present

## 2018-02-08 DIAGNOSIS — Z7982 Long term (current) use of aspirin: Secondary | ICD-10-CM | POA: Diagnosis not present

## 2018-02-08 DIAGNOSIS — Z885 Allergy status to narcotic agent status: Secondary | ICD-10-CM | POA: Diagnosis not present

## 2018-02-08 DIAGNOSIS — I10 Essential (primary) hypertension: Secondary | ICD-10-CM | POA: Diagnosis not present

## 2018-02-08 DIAGNOSIS — D500 Iron deficiency anemia secondary to blood loss (chronic): Secondary | ICD-10-CM | POA: Insufficient documentation

## 2018-02-08 DIAGNOSIS — K5730 Diverticulosis of large intestine without perforation or abscess without bleeding: Secondary | ICD-10-CM | POA: Insufficient documentation

## 2018-02-08 DIAGNOSIS — I2510 Atherosclerotic heart disease of native coronary artery without angina pectoris: Secondary | ICD-10-CM | POA: Insufficient documentation

## 2018-02-08 DIAGNOSIS — Z880 Allergy status to penicillin: Secondary | ICD-10-CM | POA: Insufficient documentation

## 2018-02-08 DIAGNOSIS — Z886 Allergy status to analgesic agent status: Secondary | ICD-10-CM | POA: Diagnosis not present

## 2018-02-08 DIAGNOSIS — D509 Iron deficiency anemia, unspecified: Secondary | ICD-10-CM | POA: Diagnosis not present

## 2018-02-08 DIAGNOSIS — K5180 Other ulcerative colitis without complications: Secondary | ICD-10-CM | POA: Diagnosis not present

## 2018-02-08 HISTORY — DX: Diffuse cystic mastopathy of unspecified breast: N60.19

## 2018-02-08 HISTORY — DX: Pure hypercholesterolemia, unspecified: E78.00

## 2018-02-08 HISTORY — DX: Presence of coronary angioplasty implant and graft: Z95.5

## 2018-02-08 HISTORY — PX: COLONOSCOPY WITH PROPOFOL: SHX5780

## 2018-02-08 HISTORY — PX: ESOPHAGOGASTRODUODENOSCOPY (EGD) WITH PROPOFOL: SHX5813

## 2018-02-08 LAB — HM COLONOSCOPY

## 2018-02-08 SURGERY — COLONOSCOPY WITH PROPOFOL
Anesthesia: General

## 2018-02-08 MED ORDER — PROPOFOL 500 MG/50ML IV EMUL
INTRAVENOUS | Status: AC
  Filled 2018-02-08: qty 50

## 2018-02-08 MED ORDER — PROPOFOL 500 MG/50ML IV EMUL
INTRAVENOUS | Status: DC | PRN
  Administered 2018-02-08: 150 ug/kg/min via INTRAVENOUS

## 2018-02-08 MED ORDER — PROPOFOL 10 MG/ML IV BOLUS
INTRAVENOUS | Status: DC | PRN
  Administered 2018-02-08: 100 mg via INTRAVENOUS

## 2018-02-08 MED ORDER — FENTANYL CITRATE (PF) 100 MCG/2ML IJ SOLN
INTRAMUSCULAR | Status: DC | PRN
  Administered 2018-02-08 (×2): 50 ug via INTRAVENOUS

## 2018-02-08 MED ORDER — LIDOCAINE 2% (20 MG/ML) 5 ML SYRINGE
INTRAMUSCULAR | Status: DC | PRN
  Administered 2018-02-08: 30 mg via INTRAVENOUS

## 2018-02-08 MED ORDER — PHENYLEPHRINE HCL 10 MG/ML IJ SOLN
INTRAMUSCULAR | Status: DC | PRN
  Administered 2018-02-08: 100 ug via INTRAVENOUS

## 2018-02-08 MED ORDER — SODIUM CHLORIDE 0.9 % IV SOLN
INTRAVENOUS | Status: DC
  Administered 2018-02-08: 11:00:00 via INTRAVENOUS

## 2018-02-08 MED ORDER — FENTANYL CITRATE (PF) 100 MCG/2ML IJ SOLN
INTRAMUSCULAR | Status: AC
  Filled 2018-02-08: qty 2

## 2018-02-08 NOTE — Anesthesia Post-op Follow-up Note (Signed)
Anesthesia QCDR form completed.        

## 2018-02-08 NOTE — Anesthesia Preprocedure Evaluation (Signed)
Anesthesia Evaluation  Patient identified by MRN, date of birth, ID band Patient awake    Reviewed: Allergy & Precautions, H&P , NPO status , Patient's Chart, lab work & pertinent test results  History of Anesthesia Complications (+) PONV and history of anesthetic complications  Airway Mallampati: III  TM Distance: <3 FB Neck ROM: limited    Dental  (+) Chipped   Pulmonary neg shortness of breath, former smoker,           Cardiovascular Exercise Tolerance: Good hypertension, + CAD, + Past MI, + Cardiac Stents and + Peripheral Vascular Disease  + Valvular Problems/Murmurs      Neuro/Psych TIAnegative psych ROS   GI/Hepatic Neg liver ROS, PUD, GERD  Medicated and Controlled,  Endo/Other  Hyperthyroidism   Renal/GU Renal disease  negative genitourinary   Musculoskeletal  (+) Arthritis ,   Abdominal   Peds  Hematology negative hematology ROS (+)   Anesthesia Other Findings Past Medical History: No date: Allergy No date: Anemia No date: Arthritis No date: Colitis No date: Complication of anesthesia     Comment:  nausea No date: Environmental and seasonal allergies No date: Fibrocystic breast disease (FCBD) No date: GERD (gastroesophageal reflux disease) No date: High cholesterol No date: History of heart artery stent No date: Hyperlipidemia No date: Hypertension No date: Myocardial infarction ([place]) No date: Osteoporosis No date: Thyroid disease No date: TIA (transient ischemic attack)  Past Surgical History: 2007: ABDOMINAL HYSTERECTOMY 2004: BREAST ENHANCEMENT SURGERY No date: BREAST IMPLANT REMOVAL 1984: BREAST SURGERY     Comment:  Fibrocystic Disease 06/21/2014: CAROTID ENDARTERECTOMY; Left     Comment:  Dr. Delana Meyer 08/02/2014: CAROTID ENDARTERECTOMY; Right     Comment:  Dr. Delana Meyer 06/15/2017: CAROTID PTA/STENT INTERVENTION; N/A     Comment:  Procedure: CAROTID PTA/STENT INTERVENTION;  Surgeon:                Katha Cabal, MD;  Location: [place] [facility];               Service: Cardiovascular;  Laterality: N/A; 07/14/2015: COLONOSCOPY WITH PROPOFOL; N/A     Comment:  Procedure: COLONOSCOPY WITH PROPOFOL;  Surgeon: Hulen Luster, MD;  Location: [facility] ENDOSCOPY;  Service:               Gastroenterology;  Laterality: N/A; 1999: CORONARY ANGIOPLASTY WITH STENT PLACEMENT 08/2003: FEMORAL ARTERY STENT 2008: HERNIA REPAIR 08/2003: RENAL ARTERY STENT No date: TRANSUMBILICAL AUGMENTATION MAMMAPLASTY  BMI    Body Mass Index:  25.51 kg/m      Reproductive/Obstetrics negative OB ROS                             Anesthesia Physical Anesthesia Plan  ASA: III  Anesthesia Plan: General   Post-op Pain Management:    Induction: Intravenous  PONV Risk Score and Plan: Propofol infusion and TIVA  Airway Management Planned: Natural Airway and Nasal Cannula  Additional Equipment:   Intra-op Plan:   Post-operative Plan:   Informed Consent: I have reviewed the patients History and Physical, chart, labs and discussed the procedure including the risks, benefits and alternatives for the proposed anesthesia with the patient or authorized representative who has indicated his/her understanding and acceptance.   Dental Advisory Given  Plan Discussed with: Anesthesiologist, CRNA and Surgeon  Anesthesia Plan Comments: (Patient consented for risks of anesthesia including  but not limited to:  - adverse reactions to medications - risk of intubation if required - damage to teeth, lips or other oral mucosa - sore throat or hoarseness - Damage to heart, brain, lungs or loss of life  Patient voiced understanding.)        Anesthesia Quick Evaluation

## 2018-02-08 NOTE — Op Note (Signed)
[facility] Patient Name: Misty Page Procedure Date: 02/08/2018 10:33 AM MRN: [id number] Account #: [id number] Date of Birth: [birth year] Admit Type: Outpatient Age: 77 Room: [facility] ENDO ROOM 2 Gender: Female Note Status: Finalized Procedure:            Upper GI endoscopy Indications:          Iron deficiency anemia secondary to chronic blood loss Providers:            Benay Pike. [place] MD, MD Medicines:            Propofol per Anesthesia Complications:        No immediate complications. Procedure:            Pre-Anesthesia Assessment:                       - The risks and benefits of the procedure and the                        sedation options and risks were discussed with the                        patient. All questions were answered and informed                        consent was obtained.                       - Patient identification and proposed procedure were                        verified prior to the procedure by the nurse. The                        procedure was verified in the procedure room.                       - ASA Grade Assessment: III - A patient with severe                        systemic disease.                       - After reviewing the risks and benefits, the patient                        was deemed in satisfactory condition to undergo the                        procedure.                       After obtaining informed consent, the endoscope was                        passed under direct vision. Throughout the procedure,                        the patient's blood pressure, pulse, and oxygen                        saturations were monitored continuously. The Endoscope  was introduced through the mouth, and advanced to the                        third part of duodenum. The upper GI endoscopy was                        accomplished without difficulty. The patient tolerated                        the  procedure well. Findings:      The examined esophagus was normal.      The entire examined stomach was normal.      The examined duodenum was normal. Impression:           - Normal esophagus.                       - Normal stomach.                       - Normal examined duodenum.                       - No specimens collected. Recommendation:       - Proceed with colonoscopy Procedure Code(s):    --- Professional ---                       [phone], Esophagogastroduodenoscopy, flexible, transoral;                        diagnostic, including collection of specimen(s) by                        brushing or washing, when performed (separate procedure) Diagnosis Code(s):    --- Professional ---                       D50.0, Iron deficiency anemia secondary to blood loss                        (chronic) CPT copyright 2017 American [employer] Association. All rights reserved. The codes documented in this report are preliminary and upon coder review may  be revised to meet current compliance requirements. Efrain Sella MD, MD 02/08/2018 11:29:45 AM This report has been signed electronically. Number of Addenda: 0 Note Initiated On: 02/08/2018 10:33 AM      [facility]

## 2018-02-08 NOTE — Anesthesia Postprocedure Evaluation (Signed)
Anesthesia Post Note  Patient: Misty Page  Procedure(s) Performed: COLONOSCOPY WITH PROPOFOL (N/A ) ESOPHAGOGASTRODUODENOSCOPY (EGD) WITH PROPOFOL (N/A )  Patient location during evaluation: Endoscopy Anesthesia Type: General Level of consciousness: awake and alert Pain management: pain level controlled Vital Signs Assessment: post-procedure vital signs reviewed and stable Respiratory status: spontaneous breathing, nonlabored ventilation, respiratory function stable and patient connected to nasal cannula oxygen Cardiovascular status: blood pressure returned to baseline and stable Postop Assessment: no apparent nausea or vomiting Anesthetic complications: no     Last Vitals:  Vitals:   02/08/18 1202 02/08/18 1212  BP: 124/74 (!) 110/95  Pulse: 81 76  Resp: 16 13  Temp:    SpO2: 100% 100%    Last Pain:  Vitals:   02/08/18 1212  TempSrc:   PainSc: 0-No pain                 Precious Haws Dior Stepter

## 2018-02-08 NOTE — Op Note (Signed)
[facility] Patient Name: Misty Page Procedure Date: 02/08/2018 11:21 AM MRN: [id number] Account #: [id number] Date of Birth: 1941-04-27 Admit Type: Outpatient Age: 77 Room: [facility] ENDO ROOM 2 Gender: Female Note Status: Finalized Procedure:            Colonoscopy Indications:          Ulcerative colitis Providers:            Benay Pike. Tasha Jindra MD, MD Medicines:            Propofol per Anesthesia Complications:        No immediate complications. Procedure:            Pre-Anesthesia Assessment:                       - The risks and benefits of the procedure and the                        sedation options and risks were discussed with the                        patient. All questions were answered and informed                        consent was obtained.                       - Patient identification and proposed procedure were                        verified prior to the procedure by the nurse. The                        procedure was verified in the procedure room.                       - ASA Grade Assessment: III - A patient with severe                        systemic disease.                       - After reviewing the risks and benefits, the patient                        was deemed in satisfactory condition to undergo the                        procedure.                       After obtaining informed consent, the colonoscope was                        passed under direct vision. Throughout the procedure,                        the patient's blood pressure, pulse, and oxygen                        saturations were monitored continuously. The  Colonoscope was introduced through the anus and                        advanced to the the terminal ileum, with identification                        of the appendiceal orifice and IC valve. The                        colonoscopy was performed without difficulty. The         patient tolerated the procedure well. The quality of                        the bowel preparation was excellent. The terminal                        ileum, ileocecal valve, appendiceal orifice, and rectum                        were photographed. Findings:      The perianal and digital rectal examinations were normal. Pertinent       negatives include normal sphincter tone and no palpable rectal lesions.      Many small-mouthed diverticula were found in the entire colon. There was       no evidence of diverticular bleeding.      The terminal ileum appeared normal.      Normal mucosa was found in the entire colon. Four biopsies were obtained       with cold forceps for histology in the right colon, as well as four       biopsies in the left colon four biopsies in the rectum [Number of       Biopsies]. Impression:           - Mild diverticulosis in the entire examined colon.                        There was no evidence of diverticular bleeding.                       - The examined portion of the ileum was normal.                       - Normal mucosa in the entire examined colon.                       - Biopsies performed in the right colon, in the left                        colon in the rectum [Additional Site].                       - Hemorrhoids. Recommendation:       - Patient has a contact number available for                        emergencies. The signs and symptoms of potential                        delayed complications were discussed with the patient.  Return to normal activities tomorrow. Written discharge                        instructions were provided to the patient.                       - Resume previous diet.                       - Continue present medications.                       - Await pathology results.                       - No repeat colonoscopy due to age.                       - Return to GI office in 6 months.                        - The findings and recommendations were discussed with                        the patient and their family. Procedure Code(s):    --- Professional ---                       [phone], Colonoscopy, flexible; with biopsy, single or                        multiple Diagnosis Code(s):    --- Professional ---                       K57.30, Diverticulosis of large intestine without                        perforation or abscess without bleeding                       K51.90, Ulcerative colitis, unspecified, without                        complications                       K64.9, Unspecified hemorrhoids CPT copyright 2017 American Medical Association. All rights reserved. The codes documented in this report are preliminary and upon coder review may  be revised to meet current compliance requirements. Efrain Sella MD, MD 02/08/2018 11:51:09 AM This report has been signed electronically. Number of Addenda: 0 Note Initiated On: 02/08/2018 11:21 AM Scope Withdrawal Time: 0 hours 11 minutes 28 seconds  Total Procedure Duration: 0 hours 16 minutes 50 seconds       [facility]

## 2018-02-08 NOTE — Interval H&P Note (Signed)
History and Physical Interval Note:  02/08/2018 11:03 AM  Misty Page  has presented today for surgery, with the diagnosis of IDA HX UC  The various methods of treatment have been discussed with the patient and family. After consideration of risks, benefits and other options for treatment, the patient has consented to  Procedure(s): COLONOSCOPY WITH PROPOFOL (N/A) ESOPHAGOGASTRODUODENOSCOPY (EGD) WITH PROPOFOL (N/A) as a surgical intervention .  The patient's history has been reviewed, patient examined, no change in status, stable for surgery.  I have reviewed the patient's chart and labs.  Questions were answered to the patient's satisfaction.     [place], [place]

## 2018-02-08 NOTE — Transfer of Care (Signed)
Immediate Anesthesia Transfer of Care Note  Patient: Misty Page  Procedure(s) Performed: COLONOSCOPY WITH PROPOFOL (N/A ) ESOPHAGOGASTRODUODENOSCOPY (EGD) WITH PROPOFOL (N/A )  Patient Location: PACU and Endoscopy Unit  Anesthesia Type:General  Level of Consciousness: awake and patient cooperative  Airway & Oxygen Therapy: Patient Spontanous Breathing and Patient connected to nasal cannula oxygen  Post-op Assessment: Report given to RN and Post -op Vital signs reviewed and stable  Post vital signs: Reviewed and stable  Last Vitals:  Vitals Value Taken Time  BP    Temp    Pulse    Resp    SpO2      Last Pain:  Vitals:   02/08/18 1032  TempSrc: Tympanic  PainSc: 0-No pain         Complications: No apparent anesthesia complications

## 2018-02-08 NOTE — H&P (Signed)
Outpatient short stay form Pre-procedure 02/08/2018 11:00 AM Misty Page K. Alice Reichert, M.D.  Primary Physician: Lavon Paganini, M.D.  Reason for visit:  Iron deficiency anemia, ulcerative colitis  History of present illness: Patient is a pleasant 77 year old female with a personal history of ulcerative colitis as well as recent finding of progressive iron deficiency anemia.  Patient tends to have 2-3 episodes of flaring of colitis per year.  She takes Asacol HD 1600 mg twice daily.  Patient denies any upper GI symptoms such as nausea vomiting severe GERD or dysphagia.    Current Facility-Administered Medications:  .  0.9 %  sodium chloride infusion, , Intravenous, Continuous, Adrinne Sze, Benay Pike, MD  Medications Prior to Admission  Medication Sig Dispense Refill Last Dose  . hyoscyamine (ANASPAZ) 0.125 MG TBDP disintergrating tablet Place 0.125 mg under the tongue every 6 (six) hours as needed.     . Ascorbic Acid (VITAMIN C) 1000 MG tablet Take 500 mg by mouth daily.    Taking  . aspirin 81 MG tablet Take 81 mg by mouth daily. ASPIRIN, 81MG (Oral Tablet)  1 Every Day for 0 days  Quantity: 0.00;  Refills: 0   Ordered :13-Apr-2010  Celene Kras, MA, Anastasiya ;  Started 29-October-2008 Active Comments: DX: 414.00   Taking  . B COMPLEX VITAMINS PO Take 1 tablet by mouth daily. B COMPLEX (Oral Capsule)  1 Every Day for 0 days  Quantity: 0.00;  Refills: 0   Ordered :13-Apr-2010  Celene Kras, MA, Anastasiya ;  Started 29-October-2008 Active Comments: DX: 414.00   Taking  . Calcium-Vitamin D-Vitamin K 500-100-40 MG-UNT-MCG CHEW Chew 1 tablet by mouth 2 (two) times daily. VIACTIV, 500-100-40 (Oral Tablet Chewable)  1 tablet twice a day for 0 days  Quantity: 0.00;  Refills: 0   Ordered :13-Apr-2010  Celene Kras, MA, Anastasiya ;  Started 29-October-2008 Active Comments: DX: 733.00   Taking  . clopidogrel (PLAVIX) 75 MG tablet Take 1 tablet (75 mg total) by mouth daily. 30 tablet 5 02/01/2018  . Coenzyme Q10 (COQ10)  200 MG CAPS Take 1 capsule by mouth at bedtime.   Taking  . Cranberry 300 MG tablet Take 300 mg by mouth daily.    Taking  . famotidine (PEPCID) 40 MG tablet TAKE ONE (1) TABLET BY MOUTH EVERY DAY 90 tablet 1   . ferrous sulfate 325 (65 FE) MG EC tablet Take 325 mg by mouth daily. FERROUS SULFATE, 325 (65 Fe)MG (Oral Tablet Delayed Release)  1 every day for 0 days  Quantity: 0.00;  Refills: 0   Ordered :13-Apr-2010  Celene Kras, MA, Anastasiya ;  Started 29-October-2008 Active Comments: DX: 285.9   Taking  . fexofenadine (ALLEGRA) 180 MG tablet Take 180 mg by mouth at bedtime.    Taking  . fluticasone (FLONASE) 50 MCG/ACT nasal spray USE 2 SPRAYS EACH NOSTRIL DAILY 48 g 1 Taking  . hydrochlorothiazide (HYDRODIURIL) 25 MG tablet TAKE ONE (1) TABLET EACH DAY 90 tablet 3 Taking  . losartan (COZAAR) 100 MG tablet TAKE ONE (1) TABLET EACH DAY 90 tablet 2 Taking  . Mesalamine (ASACOL HD) 800 MG TBEC TAKE 2 TABLETS BY MOUTH TWICE A DAY.   Taking  . methimazole (TAPAZOLE) 5 MG tablet Take 2.5 mg by mouth.    Taking  . montelukast (SINGULAIR) 10 MG tablet TAKE ONE TABLET AT BEDTIME 90 tablet 2 Taking  . Multiple Vitamins-Minerals (CENTRUM SILVER PO) Take 1 capsule by mouth daily.   Taking  . rosuvastatin (CRESTOR) 20 MG tablet Take 0.5 tablets (  10 mg total) by mouth daily. 45 tablet 2 Taking  . triamcinolone cream (KENALOG) 0.1 %    Not Taking     Allergies  Allergen Reactions  . Amoxicillin Anaphylaxis  . Naproxen Hives, Shortness Of Breath, Nausea And Vomiting and Swelling  . Penicillins Shortness Of Breath, Swelling and Rash    Has patient had a PCN reaction causing immediate rash, facial/tongue/throat swelling, SOB or lightheadedness with hypotension: Yes Has patient had a PCN reaction causing severe rash involving mucus membranes or skin necrosis: Yes Has patient had a PCN reaction that required hospitalization: Yes Has patient had a PCN reaction occurring within the last 10 years: No  If all of  the above answers are "NO", then may proceed with Cephalosporin use.   . Codeine Nausea And Vomiting  . Levofloxacin Nausea And Vomiting  . Shellfish Allergy Hives, Diarrhea, Nausea And Vomiting and Swelling     Past Medical History:  Diagnosis Date  . Allergy   . Anemia   . Arthritis   . Colitis   . Complication of anesthesia    nausea  . Environmental and seasonal allergies   . Fibrocystic breast disease (FCBD)   . GERD (gastroesophageal reflux disease)   . High cholesterol   . History of heart artery stent   . Hyperlipidemia   . Hypertension   . Myocardial infarction ([place])   . Osteoporosis   . Thyroid disease   . TIA (transient ischemic attack)     Review of systems:  Otherwise negative.    Physical Exam  Gen: Alert, oriented. Appears stated age.  HEENT: [place]/AT. PERRLA. Lungs: CTA, no wheezes. CV: RR nl S1, S2. Abd: soft, benign, no masses. BS+ Ext: No edema. Pulses 2+    Planned procedures: Proceed with EGD and colonoscopy. The patient understands the nature of the planned procedure, indications, risks, alternatives and potential complications including but not limited to bleeding, infection, perforation, damage to internal organs and possible oversedation/side effects from anesthesia. The patient agrees and gives consent to proceed.  Please refer to procedure notes for findings, recommendations and patient disposition/instructions.     Savon Cobbs K. Alice Reichert, M.D. Gastroenterology 02/08/2018  11:00 AM

## 2018-02-10 DIAGNOSIS — M75111 Incomplete rotator cuff tear or rupture of right shoulder, not specified as traumatic: Secondary | ICD-10-CM | POA: Diagnosis not present

## 2018-02-10 DIAGNOSIS — M7581 Other shoulder lesions, right shoulder: Secondary | ICD-10-CM | POA: Diagnosis not present

## 2018-02-10 LAB — SURGICAL PATHOLOGY

## 2018-02-20 LAB — EGD

## 2018-02-27 DIAGNOSIS — K51919 Ulcerative colitis, unspecified with unspecified complications: Secondary | ICD-10-CM | POA: Diagnosis not present

## 2018-04-25 ENCOUNTER — Ambulatory Visit
Admission: RE | Admit: 2018-04-25 | Discharge: 2018-04-25 | Disposition: A | Discharge status: Home / Self Care | Payer: PPO | Source: Ambulatory Visit | Attending: Physician Assistant | Admitting: Physician Assistant

## 2018-04-25 ENCOUNTER — Encounter: Payer: Self-pay | Admitting: Physician Assistant

## 2018-04-25 ENCOUNTER — Ambulatory Visit (INDEPENDENT_AMBULATORY_CARE_PROVIDER_SITE_OTHER): Payer: PPO | Admitting: Physician Assistant

## 2018-04-25 VITALS — BP 104/60 | HR 72 | Temp 98.2°F | Resp 16 | Wt 137.8 lb

## 2018-04-25 DIAGNOSIS — M7989 Other specified soft tissue disorders: Secondary | ICD-10-CM

## 2018-04-25 DIAGNOSIS — R300 Dysuria: Secondary | ICD-10-CM

## 2018-04-25 NOTE — Progress Notes (Signed)
Acute Office Visit  Subjective:    Patient ID: Misty Page, female    DOB: 12/08/1940, 77 y.o.   MRN: [id number]  Chief Complaint  Patient presents with  . Leg Swelling    HPI Patient is in today for left leg swelling x's 2 weeks. She reports a history of five femoral artery stents for peripheral vascular disease but she does not recall which leg they were placed in. She reports the swelling happened slowly and is in her left upper leg. She has mild pain all day. She denies any injuries, falls, bumping into any objects. She denies redness or bruising. She denies fever or chills.    Patient reports pelvic pressure. Concerned for UTI.  Past Medical History:  Diagnosis Date  . Allergy   . Anemia   . Arthritis   . Colitis   . Complication of anesthesia    nausea  . Environmental and seasonal allergies   . Fibrocystic breast disease (FCBD)   . GERD (gastroesophageal reflux disease)   . High cholesterol   . History of heart artery stent   . Hyperlipidemia   . Hypertension   . Myocardial infarction ([place])   . Osteoporosis   . Thyroid disease   . TIA (transient ischemic attack)     Past Surgical History:  Procedure Laterality Date  . ABDOMINAL HYSTERECTOMY  2007  . BREAST ENHANCEMENT SURGERY  2004  . BREAST IMPLANT REMOVAL    . BREAST SURGERY  1984   Fibrocystic Disease  . CAROTID ENDARTERECTOMY Left 06/21/2014   Dr. Delana Meyer  . CAROTID ENDARTERECTOMY Right 08/02/2014   Dr. Delana Meyer  . CAROTID PTA/STENT INTERVENTION N/A 06/15/2017   Procedure: CAROTID PTA/STENT INTERVENTION;  Surgeon: Katha Cabal, MD;  Location: [place] [facility];  Service: Cardiovascular;  Laterality: N/A;  . COLONOSCOPY WITH PROPOFOL N/A 07/14/2015   Procedure: COLONOSCOPY WITH PROPOFOL;  Surgeon: Hulen Luster, MD;  Location[facility] ENDOSCOPY;  Service: Gastroenterology;  Laterality: N/A;  . COLONOSCOPY WITH PROPOFOL N/A 02/08/2018   Procedure: COLONOSCOPY WITH PROPOFOL;  Surgeon: Toledo, Benay Pike,  MD;  Location: [facility] ENDOSCOPY;  Service: Gastroenterology;  Laterality: N/A;  . CORONARY ANGIOPLASTY WITH STENT PLACEMENT  1999  . ESOPHAGOGASTRODUODENOSCOPY (EGD) WITH PROPOFOL N/A 02/08/2018   Procedure: ESOPHAGOGASTRODUODENOSCOPY (EGD) WITH PROPOFOL;  Surgeon: Toledo, Benay Pike, MD;  Location: [facility] ENDOSCOPY;  Service: Gastroenterology;  Laterality: N/A;  . FEMORAL ARTERY STENT  08/2003  . HERNIA REPAIR  2008  . RENAL ARTERY STENT  08/2003  . TRANSUMBILICAL AUGMENTATION MAMMAPLASTY      Family History  Problem Relation Age of Onset  . Hyperlipidemia Mother   . Congestive Heart Failure Mother   . COPD Mother   . Heart disease Mother   . Hypertension Mother   . Osteoporosis Mother   . Pancreatic cancer Father   . Arthritis Sister   . Alzheimer's disease Sister   . Hypertension Sister   . Prostate cancer Brother   . Heart attack Brother   . Stomach cancer Brother   . Kidney failure Brother   . Leukemia Brother   . Emphysema Brother   . Pancreatic cancer Brother   . Stomach cancer Brother   . Breast cancer Paternal Grandmother   . Leukemia Paternal Grandfather   . Heart attack Maternal Uncle   . Stroke Maternal Aunt     Social History   Socioeconomic History  . Marital status: Widowed    Spouse name: Konrad Dolores  . Number of children:  1  . Years of education: Not on file  . Highest education level: 12th grade  Occupational History  . Occupation: Emergency planning/management officer  Social Needs  . Financial resource strain: Not hard at all  . Food insecurity:    Worry: Never true    Inability: Never true  . Transportation needs:    Medical: No    Non-medical: No  Tobacco Use  . Smoking status: Former Smoker    Last attempt to quit: 05/30/1994    Years since quitting: 23.9  . Smokeless tobacco: Never Used  Substance and Sexual Activity  . Alcohol use: Yes    Comment: rare  . Drug use: No  . Sexual activity: Never  Lifestyle  . Physical activity:    Days per week: Not on file     Minutes per session: Not on file  . Stress: Rather much  Relationships  . Social connections:    Talks on phone: Not on file    Gets together: Not on file    Attends religious service: Not on file    Active member of club or organization: Not on file    Attends meetings of clubs or organizations: Not on file    Relationship status: Not on file  . Intimate partner violence:    Fear of current or ex partner: Not on file    Emotionally abused: Not on file    Physically abused: Not on file    Forced sexual activity: Not on file  Other Topics Concern  . Not on file  Social History Narrative  . Not on file    Outpatient Medications Prior to Visit  Medication Sig Dispense Refill  . Ascorbic Acid (VITAMIN C) 1000 MG tablet Take 500 mg by mouth daily.     . B COMPLEX VITAMINS PO Take 1 tablet by mouth daily. B COMPLEX (Oral Capsule)  1 Every Day for 0 days  Quantity: 0.00;  Refills: 0   Ordered :13-Apr-2010  Celene Kras, MA, Anastasiya ;  Started 29-October-2008 Active Comments: DX: 414.00    . Calcium-Vitamin D-Vitamin K 500-100-40 MG-UNT-MCG CHEW Chew 1 tablet by mouth 2 (two) times daily. VIACTIV, 500-100-40 (Oral Tablet Chewable)  1 tablet twice a day for 0 days  Quantity: 0.00;  Refills: 0   Ordered :13-Apr-2010  Celene Kras, MA, Anastasiya ;  Started 29-October-2008 Active Comments: DX: 733.00    . clopidogrel (PLAVIX) 75 MG tablet Take 1 tablet (75 mg total) by mouth daily. 30 tablet 5  . Coenzyme Q10 (COQ10) 200 MG CAPS Take 1 capsule by mouth at bedtime.    . Cranberry 300 MG tablet Take 300 mg by mouth daily.     . famotidine (PEPCID) 40 MG tablet TAKE ONE (1) TABLET BY MOUTH EVERY DAY 90 tablet 1  . ferrous sulfate 325 (65 FE) MG EC tablet Take 325 mg by mouth daily. FERROUS SULFATE, 325 (65 Fe)MG (Oral Tablet Delayed Release)  1 every day for 0 days  Quantity: 0.00;  Refills: 0   Ordered :13-Apr-2010  Celene Kras, MA, Anastasiya ;  Started 29-October-2008 Active Comments: DX: 285.9    .  fexofenadine (ALLEGRA) 180 MG tablet Take 180 mg by mouth at bedtime.     . fluticasone (FLONASE) 50 MCG/ACT nasal spray USE 2 SPRAYS EACH NOSTRIL DAILY 48 g 1  . hydrochlorothiazide (HYDRODIURIL) 25 MG tablet TAKE ONE (1) TABLET EACH DAY 90 tablet 3  . losartan (COZAAR) 100 MG tablet TAKE ONE (1) TABLET EACH DAY 90 tablet 2  .  Mesalamine (ASACOL HD) 800 MG TBEC TAKE 2 TABLETS BY MOUTH TWICE A DAY.    . methimazole (TAPAZOLE) 5 MG tablet Take 2.5 mg by mouth.     . montelukast (SINGULAIR) 10 MG tablet TAKE ONE TABLET AT BEDTIME 90 tablet 2  . Multiple Vitamins-Minerals (CENTRUM SILVER PO) Take 1 capsule by mouth daily.    . rosuvastatin (CRESTOR) 20 MG tablet Take 0.5 tablets (10 mg total) by mouth daily. 45 tablet 2  . triamcinolone cream (KENALOG) 0.1 %     . aspirin 81 MG tablet Take 81 mg by mouth daily. ASPIRIN, 81MG (Oral Tablet)  1 Every Day for 0 days  Quantity: 0.00;  Refills: 0   Ordered :13-Apr-2010  Karoline Caldwell, Anastasiya ;  Started 29-October-2008 Active Comments: DX: 414.00    . hyoscyamine (ANASPAZ) 0.125 MG TBDP disintergrating tablet Place 0.125 mg under the tongue every 6 (six) hours as needed.     No facility-administered medications prior to visit.     Allergies  Allergen Reactions  . Amoxicillin Anaphylaxis  . Naproxen Hives, Shortness Of Breath, Nausea And Vomiting and Swelling  . Penicillins Shortness Of Breath, Swelling and Rash    Has patient had a PCN reaction causing immediate rash, facial/tongue/throat swelling, SOB or lightheadedness with hypotension: Yes Has patient had a PCN reaction causing severe rash involving mucus membranes or skin necrosis: Yes Has patient had a PCN reaction that required hospitalization: Yes Has patient had a PCN reaction occurring within the last 10 years: No  If all of the above answers are "NO", then may proceed with Cephalosporin use.   . Codeine Nausea And Vomiting  . Levofloxacin Nausea And Vomiting  . Shellfish Allergy  Hives, Diarrhea, Nausea And Vomiting and Swelling    Review of Systems  Constitutional: Negative.   Cardiovascular: Positive for leg swelling.       Objective:    Physical Exam  Constitutional: She is oriented to person, place, and time.  Cardiovascular:  Pulses:      Dorsalis pedis pulses are 1+ on the right side, and 1+ on the left side.  Cap refill < 3 seconds in bilateral lower extremities. Feet are warm and have good coloring.   Musculoskeletal: She exhibits edema.  There is gross swelling of her left upper leg. Circumference measured midway at her left upper leg is 19.5 inches in the left leg and 18 inches in the right leg. There is a palpable density over a span of 4 cm on the anterior left leg that is not discrete. Calves are non swollen and non tender to palpation.   Neurological: She is alert and oriented to person, place, and time.  Skin: Skin is warm and dry.  Psychiatric: She has a normal mood and affect. Her behavior is normal.    BP 104/60 (BP Location: Left Arm, Patient Position: Sitting, Cuff Size: Normal)   Pulse 72   Temp 98.2 F (36.8 C) (Oral)   Resp 16   Wt 137 lb 12.8 oz (62.5 kg)   SpO2 99%   BMI 26.04 kg/m  Wt Readings from Last 3 Encounters:  04/25/18 137 lb 12.8 oz (62.5 kg)  02/08/18 135 lb (61.2 kg)  12/16/17 135 lb (61.2 kg)    Health Maintenance Due  Topic Date Due  . INFLUENZA VACCINE  12/29/2017    There are no preventive care reminders to display for this patient.   Lab Results  Component Value Date   TSH 0.022 (L) 12/22/2016  Lab Results  Component Value Date   WBC 10.6 06/16/2017   HGB 12.1 06/16/2017   HCT 35.7 06/16/2017   MCV 94.4 06/16/2017   PLT 272 06/16/2017   Lab Results  Component Value Date   NA 137 06/16/2017   K 3.4 (L) 06/16/2017   CO2 24 06/16/2017   GLUCOSE 145 (H) 06/16/2017   BUN 11 06/16/2017   CREATININE 0.67 06/16/2017   BILITOT 0.3 06/28/2016   ALKPHOS 65 06/28/2016   AST 17 06/28/2016    ALT 16 06/28/2016   PROT 6.7 06/28/2016   ALBUMIN 4.4 06/28/2016   CALCIUM 8.1 (L) 06/16/2017   ANIONGAP 9 06/16/2017   Lab Results  Component Value Date   CHOL 193 12/22/2016   Lab Results  Component Value Date   HDL 74 12/22/2016   Lab Results  Component Value Date   LDLCALC 100 (H) 12/22/2016   Lab Results  Component Value Date   TRIG 93 12/22/2016   Lab Results  Component Value Date   CHOLHDL 2.6 12/22/2016   No results found for: HGBA1C     Assessment & Plan:  1. Left leg swelling  Concerned for DVT, somewhat concerned for possible femoral stent stenosis. Have ordered ultrasound of left lower extremity to exclude DVT. Her ultrasound came back negative for DVT but did not examine the iliac. Have discussed this case with patient's vascular providers Eulogio Ditch, NP at [facility] and discussed. She did have pulses and warm feet bilaterally, talked about possibility of venous congestion with Eulogio Ditch, NP. Have agreed for [facility] to reach out to her with an appointment for her in the upcoming week after the holiday. I have called the patient and told her to expect call from vascular center. If in the meantime she has severe pain, cold extremity or color change she should be seen emergently.  - VAS [place] LOWER EXTREMITY VENOUS (DVT); Future - US Venous Img Lower Unilateral Left; Future - US Venous Img Lower Unilateral Left; Future  2. Dysuria  Dipstick completely normal. Sending for culture due to patient concern.  - Urine Culture  I have spent 25 minutes with this patient, >50% of which was spent on counseling and coordination of care.   Return if symptoms worsen or fail to improve.   Trinna Post, [name]

## 2018-04-26 LAB — POCT URINALYSIS DIPSTICK
Bilirubin, UA: NEGATIVE
Blood, UA: NEGATIVE
Glucose, UA: NEGATIVE
Ketones, UA: NEGATIVE
Leukocytes, UA: NEGATIVE
Nitrite, UA: NEGATIVE
Protein, UA: NEGATIVE
Spec Grav, UA: 1.015 (ref 1.010–1.025)
Urobilinogen, UA: 0.2 E.U./dL
pH, UA: 6 (ref 5.0–8.0)

## 2018-04-27 LAB — URINE CULTURE

## 2018-05-01 ENCOUNTER — Encounter (INDEPENDENT_AMBULATORY_CARE_PROVIDER_SITE_OTHER): Payer: Self-pay | Admitting: Vascular Surgery

## 2018-05-01 ENCOUNTER — Telehealth: Payer: Self-pay

## 2018-05-01 ENCOUNTER — Ambulatory Visit (INDEPENDENT_AMBULATORY_CARE_PROVIDER_SITE_OTHER): Payer: PPO | Admitting: Vascular Surgery

## 2018-05-01 ENCOUNTER — Other Ambulatory Visit: Payer: Self-pay

## 2018-05-01 VITALS — BP 148/79 | HR 83 | Resp 16 | Ht 61.0 in | Wt 136.0 lb

## 2018-05-01 DIAGNOSIS — M79605 Pain in left leg: Secondary | ICD-10-CM | POA: Diagnosis not present

## 2018-05-01 DIAGNOSIS — M79662 Pain in left lower leg: Secondary | ICD-10-CM

## 2018-05-01 DIAGNOSIS — I771 Stricture of artery: Secondary | ICD-10-CM | POA: Diagnosis not present

## 2018-05-01 DIAGNOSIS — E7800 Pure hypercholesterolemia, unspecified: Secondary | ICD-10-CM | POA: Diagnosis not present

## 2018-05-01 DIAGNOSIS — M7989 Other specified soft tissue disorders: Secondary | ICD-10-CM | POA: Diagnosis not present

## 2018-05-01 DIAGNOSIS — I6523 Occlusion and stenosis of bilateral carotid arteries: Secondary | ICD-10-CM

## 2018-05-01 DIAGNOSIS — K219 Gastro-esophageal reflux disease without esophagitis: Secondary | ICD-10-CM | POA: Diagnosis not present

## 2018-05-01 DIAGNOSIS — I2510 Atherosclerotic heart disease of native coronary artery without angina pectoris: Secondary | ICD-10-CM | POA: Diagnosis not present

## 2018-05-01 MED ORDER — DOXYCYCLINE HYCLATE 100 MG PO TABS: 100.0000 mg | ORAL_TABLET | Freq: Two times a day (BID) | ORAL | 0 refills | Status: DC

## 2018-05-01 NOTE — Telephone Encounter (Signed)
-----   Message from Trinna Post, [place] sent at 04/28/2018  8:23 AM EST ----- Please call patient and send doxycycline 100 mg bid x 7 days for urinary tract infection.

## 2018-05-01 NOTE — Telephone Encounter (Signed)
Patient advised. RX sent to pharmacy

## 2018-05-01 NOTE — Progress Notes (Signed)
MRN : [id number]  Misty Page is a 77 y.o. (1941-05-02) female who presents with chief complaint of  Chief Complaint  Patient presents with  . Edema    left leg  .  History of Present Illness:   The patient presents to the office sooner than expected for evaluation of a left thigh mass.  She notes that this began abruptly.  It began several days ago.  It does not seem to be improving with time.  She notes that it is tender to palpation.  No associated trauma.  No fever chills.  No redness or discoloration.  The patient is status post Stent placement to the innominate artery with 7 mm diameter by 18 mm length balloon expandable Herculink stent performed on June 15, 2017.  There were no post operative problems or complications related to the surgery.    The patient denies interval amaurosis fugax. There is no recent history of TIA symptoms or focal motor deficits. There is no prior documented CVA.  The patient denies headache.  The patient is taking enteric-coated aspirin 81 mg daily.  The patient has a history of coronary artery disease, no recent episodes of angina or shortness of breath. The patient denies PAD or claudication symptoms. There is a history of hyperlipidemia which is being treated with a statin.    Current Meds  Medication Sig  . Ascorbic Acid (VITAMIN C) 1000 MG tablet Take 500 mg by mouth daily.   . B COMPLEX VITAMINS PO Take 1 tablet by mouth daily. B COMPLEX (Oral Capsule)  1 Every Day for 0 days  Quantity: 0.00;  Refills: 0   Ordered :13-Apr-2010  Celene Kras, MA, Anastasiya ;  Started 29-October-2008 Active Comments: DX: 414.00  . Calcium-Vitamin D-Vitamin K 500-100-40 MG-UNT-MCG CHEW Chew 1 tablet by mouth 2 (two) times daily. VIACTIV, 500-100-40 (Oral Tablet Chewable)  1 tablet twice a day for 0 days  Quantity: 0.00;  Refills: 0   Ordered :13-Apr-2010  Celene Kras, MA, Anastasiya ;  Started 29-October-2008 Active Comments: DX: 733.00  . clopidogrel  (PLAVIX) 75 MG tablet Take 1 tablet (75 mg total) by mouth daily.  . Coenzyme Q10 (COQ10) 200 MG CAPS Take 1 capsule by mouth at bedtime.  . Cranberry 300 MG tablet Take 300 mg by mouth daily.   . famotidine (PEPCID) 40 MG tablet TAKE ONE (1) TABLET BY MOUTH EVERY DAY  . ferrous sulfate 325 (65 FE) MG EC tablet Take 325 mg by mouth daily. FERROUS SULFATE, 325 (65 Fe)MG (Oral Tablet Delayed Release)  1 every day for 0 days  Quantity: 0.00;  Refills: 0   Ordered :13-Apr-2010  Celene Kras, MA, Anastasiya ;  Started 29-October-2008 Active Comments: DX: 285.9  . fexofenadine (ALLEGRA) 180 MG tablet Take 180 mg by mouth at bedtime.   . fluticasone (FLONASE) 50 MCG/ACT nasal spray USE 2 SPRAYS EACH NOSTRIL DAILY  . hydrochlorothiazide (HYDRODIURIL) 25 MG tablet TAKE ONE (1) TABLET EACH DAY  . losartan (COZAAR) 100 MG tablet TAKE ONE (1) TABLET EACH DAY  . Mesalamine (ASACOL HD) 800 MG TBEC TAKE 2 TABLETS BY MOUTH TWICE A DAY.  . methimazole (TAPAZOLE) 5 MG tablet Take 2.5 mg by mouth.   . montelukast (SINGULAIR) 10 MG tablet TAKE ONE TABLET AT BEDTIME  . Multiple Vitamins-Minerals (CENTRUM SILVER PO) Take 1 capsule by mouth daily.  . rosuvastatin (CRESTOR) 20 MG tablet Take 0.5 tablets (10 mg total) by mouth daily.  Marland Kitchen triamcinolone cream (KENALOG) 0.1 %  Past Medical History:  Diagnosis Date  . Allergy   . Anemia   . Arthritis   . Colitis   . Complication of anesthesia    nausea  . Environmental and seasonal allergies   . Fibrocystic breast disease (FCBD)   . GERD (gastroesophageal reflux disease)   . High cholesterol   . History of heart artery stent   . Hyperlipidemia   . Hypertension   . Myocardial infarction ([place])   . Osteoporosis   . Thyroid disease   . TIA (transient ischemic attack)     Past Surgical History:  Procedure Laterality Date  . ABDOMINAL HYSTERECTOMY  2007  . BREAST ENHANCEMENT SURGERY  2004  . BREAST IMPLANT REMOVAL    . BREAST SURGERY  1984   Fibrocystic  Disease  . CAROTID ENDARTERECTOMY Left 06/21/2014   Dr. Delana Meyer  . CAROTID ENDARTERECTOMY Right 08/02/2014   Dr. Delana Meyer  . CAROTID PTA/STENT INTERVENTION N/A 06/15/2017   Procedure: CAROTID PTA/STENT INTERVENTION;  Surgeon: Katha Cabal, MD;  Location: [facility];  Service: Cardiovascular;  Laterality: N/A;  . COLONOSCOPY WITH PROPOFOL N/A 07/14/2015   Procedure: COLONOSCOPY WITH PROPOFOL;  Surgeon: Hulen Luster, MD;  Location: [facility];  Service: Gastroenterology;  Laterality: N/A;  . COLONOSCOPY WITH PROPOFOL N/A 02/08/2018   Procedure: COLONOSCOPY WITH PROPOFOL;  Surgeon: Toledo, Benay Pike, MD;  Location: [facility] ENDOSCOPY;  Service: Gastroenterology;  Laterality: N/A;  . CORONARY ANGIOPLASTY WITH STENT PLACEMENT  1999  . ESOPHAGOGASTRODUODENOSCOPY (EGD) WITH PROPOFOL N/A 02/08/2018   Procedure: ESOPHAGOGASTRODUODENOSCOPY (EGD) WITH PROPOFOL;  Surgeon: Toledo, Benay Pike, MD;  Location: [facility] ENDOSCOPY;  Service: Gastroenterology;  Laterality: N/A;  . FEMORAL ARTERY STENT  08/2003  . HERNIA REPAIR  2008  . RENAL ARTERY STENT  08/2003  . TRANSUMBILICAL AUGMENTATION MAMMAPLASTY      Social History Social History   Tobacco Use  . Smoking status: Former Smoker    Last attempt to quit: 05/30/1994    Years since quitting: 23.9  . Smokeless tobacco: Never Used  Substance Use Topics  . Alcohol use: Yes    Comment: rare  . Drug use: No    Family History Family History  Problem Relation Age of Onset  . Hyperlipidemia Mother   . Congestive Heart Failure Mother   . COPD Mother   . Heart disease Mother   . Hypertension Mother   . Osteoporosis Mother   . Pancreatic cancer Father   . Arthritis Sister   . Alzheimer's disease Sister   . Hypertension Sister   . Prostate cancer Brother   . Heart attack Brother   . Stomach cancer Brother   . Kidney failure Brother   . Leukemia Brother   . Emphysema Brother   . Pancreatic cancer Brother   . Stomach cancer Brother   .  Breast cancer Paternal Grandmother   . Leukemia Paternal Grandfather   . Heart attack Maternal Uncle   . Stroke Maternal Aunt     Allergies  Allergen Reactions  . Amoxicillin Anaphylaxis  . Naproxen Hives, Shortness Of Breath, Nausea And Vomiting and Swelling  . Penicillins Shortness Of Breath, Swelling and Rash    Has patient had a PCN reaction causing immediate rash, facial/tongue/throat swelling, SOB or lightheadedness with hypotension: Yes Has patient had a PCN reaction causing severe rash involving mucus membranes or skin necrosis: Yes Has patient had a PCN reaction that required hospitalization: Yes Has patient had a PCN reaction occurring within the last 10 years:  No  If all of the above answers are "NO", then may proceed with Cephalosporin use.   . Codeine Nausea And Vomiting  . Levofloxacin Nausea And Vomiting  . Shellfish Allergy Hives, Diarrhea, Nausea And Vomiting and Swelling     REVIEW OF SYSTEMS (Negative unless checked)  Constitutional: [] Weight loss  [] Fever  [] Chills Cardiac: [] Chest pain   [] Chest pressure   [] Palpitations   [] Shortness of breath when laying flat   [] Shortness of breath with exertion. Vascular:  [] Pain in legs with walking   [] Pain in legs at rest  [] History of DVT   [] Phlebitis   [x] Swelling in legs   [] Varicose veins   [] Non-healing ulcers Pulmonary:   [] Uses home oxygen   [] Productive cough   [] Hemoptysis   [] Wheeze  [] COPD   [] Asthma Neurologic:  [] Dizziness   [] Seizures   [x] History of stroke   [x] History of TIA  [] Aphasia   [] Vissual changes   [] Weakness or numbness in arm   [] Weakness or numbness in leg Musculoskeletal:   [] Joint swelling   [] Joint pain   [] Low back pain Hematologic:  [] Easy bruising  [] Easy bleeding   [] Hypercoagulable state   [] Anemic Gastrointestinal:  [] Diarrhea   [] Vomiting  [] Gastroesophageal reflux/heartburn   [] Difficulty swallowing. Genitourinary:  [] Chronic kidney disease   [] Difficult urination  [] Frequent  urination   [] Blood in urine Skin:  [] Rashes   [] Ulcers  Psychological:  [] History of anxiety   []  History of major depression.  Physical Examination  Vitals:   05/01/18 1039  BP: (!) 148/79  Pulse: 83  Resp: 16  Weight: 136 lb (61.7 kg)  Height: 5' 1"  (1.549 m)   Body mass index is 25.7 kg/m. Gen: WD/WN, NAD Head: [place]/AT, No temporalis wasting.  Ear/Nose/Throat: Hearing grossly intact, nares w/o erythema or drainage Eyes: PER, EOMI, sclera nonicteric.  Neck: Supple, no large masses.   Pulmonary:  Good air movement, no audible wheezing bilaterally, no use of accessory muscles.  Cardiac: RRR, no JVD Vascular: There is a firm 10 to 12 cm x 8 cm mass in the left lateral mid thigh.  It is tender to palpation.  It is smooth in texture.  There is no significant associated ecchymoses noted.  There is no evidence of trauma. Vessel Right Left  Radial Palpable Palpable  Popliteal Palpable Palpable  PT Palpable Palpable  DP Palpable Palpable  Gastrointestinal: Non-distended. No guarding/no peritoneal signs.  Musculoskeletal: M/S 5/5 throughout.  No deformity or atrophy.  Neurologic: CN 2-12 intact. Symmetrical.  Speech is fluent. Motor exam as listed above. Psychiatric: Judgment intact, Mood & affect appropriate for pt's clinical situation. Dermatologic: No rashes or ulcers noted.  No changes consistent with cellulitis. Lymph : No lichenification or skin changes of chronic lymphedema.  CBC Lab Results  Component Value Date   WBC 10.6 06/16/2017   HGB 12.1 06/16/2017   HCT 35.7 06/16/2017   MCV 94.4 06/16/2017   PLT 272 06/16/2017    BMET    Component Value Date/Time   NA 137 06/16/2017 0628   NA 140 06/28/2016 0909   NA 140 06/22/2014 0417   K 3.4 (L) 06/16/2017 0628   K 3.5 06/22/2014 0417   CL 104 06/16/2017 0628   CL 105 06/22/2014 0417   CO2 24 06/16/2017 0628   CO2 27 06/22/2014 0417   GLUCOSE 145 (H) 06/16/2017 0628   GLUCOSE 92 06/22/2014 0417   BUN 11  06/16/2017 0628   BUN 16 06/28/2016 0909   BUN 9 06/22/2014  0417   CREATININE 0.67 06/16/2017 0628   CREATININE 0.65 06/22/2014 0417   CALCIUM 8.1 (L) 06/16/2017 0628   CALCIUM 7.7 (L) 06/22/2014 0417   GFRNONAA >60 06/16/2017 0628   GFRNONAA >60 06/22/2014 0417   GFRNONAA >60 02/26/2013 1510   GFRAA >60 06/16/2017 0628   GFRAA >60 06/22/2014 0417   GFRAA >60 02/26/2013 1510   CrCl cannot be calculated (Patient's most recent lab result is older than the maximum 21 days allowed.).  COAG Lab Results  Component Value Date   INR 1.0 06/22/2014    Radiology US Venous Img Lower Unilateral Left  Result Date: 04/25/2018 CLINICAL DATA:  Left leg swelling for 1 week EXAM: LEFT LOWER EXTREMITY VENOUS DUPLEX ULTRASOUND TECHNIQUE: Doppler venous assessment of the left lower extremity deep venous system was performed, including characterization of spectral flow, compressibility, and phasicity. COMPARISON:  None. FINDINGS: There is complete compressibility of the left common femoral, femoral, and popliteal veins. Doppler analysis demonstrates respiratory phasicity and augmentation of flow with calf compression. No obvious superficial vein or calf vein thrombosis. IMPRESSION: No evidence of left lower extremity DVT. Electronically Signed   By: Marybelle Killings M.D.   On: 04/25/2018 16:36    Assessment/Plan 1. Pain and swelling of left lower leg I  am suspicious the patient has a spontaneous hematoma or a hematoma secondary to minor trauma that she does not recall.  Duplex ultrasound has been done already and did not characterize this area well.  She does not have DVT.  I will obtain a CT of the left leg.  Contrast will be included to better characterize this lesion.  She will follow-up with me in the office to review the CT.   A total of 30 minutes was spent with this patient and greater than 50% was spent in counseling and coordination of care with the patient.  Discussion included the treatment  options for vascular disease including indications for surgery and intervention.  Also discussed is the appropriate timing of treatment.  In addition medical therapy was discussed.  2. Innominate artery stenosis (HCC) Follow-up with duplex ultrasound as previously ordered.  Continue Plavix  3. Bilateral carotid artery stenosis Recommend:  Given the patient's asymptomatic subcritical stenosis no further invasive testing or surgery at this time.  Continue antiplatelet therapy as prescribed Continue management of CAD, HTN and Hyperlipidemia Healthy heart diet,  encouraged exercise at least 4 times per week Follow up at 6 month intervals with duplex ultrasound and physical exam   4. Arteriosclerosis of coronary artery Continue cardiac and antihypertensive medications as already ordered and reviewed, no changes at this time.  Continue statin as ordered and reviewed, no changes at this time  Nitrates PRN for chest pain   5. Gastroesophageal reflux disease, esophagitis presence not specified Continue PPI as already ordered, this medication has been reviewed and there are no changes at this time.  Avoidence of caffeine and alcohol  Moderate elevation of the head of the bed   6. Hypercholesteremia Continue statin as ordered and reviewed, no changes at this time   7. Pain of left lower extremity See #1 - CT ANGIO LOW EXTREM LEFT W &/OR WO CONTRAST; Future   Hortencia Pilar, MD  05/01/2018 10:49 AM

## 2018-05-03 ENCOUNTER — Telehealth (INDEPENDENT_AMBULATORY_CARE_PROVIDER_SITE_OTHER): Payer: Self-pay | Admitting: Vascular Surgery

## 2018-05-05 ENCOUNTER — Ambulatory Visit
Admission: RE | Admit: 2018-05-05 | Discharge: 2018-05-05 | Disposition: A | Discharge status: Home / Self Care | Payer: PPO | Source: Ambulatory Visit | Attending: Vascular Surgery | Admitting: Vascular Surgery

## 2018-05-05 DIAGNOSIS — M79605 Pain in left leg: Secondary | ICD-10-CM | POA: Diagnosis not present

## 2018-05-05 DIAGNOSIS — I743 Embolism and thrombosis of arteries of the lower extremities: Secondary | ICD-10-CM | POA: Diagnosis not present

## 2018-05-05 LAB — POCT I-STAT CREATININE: CREATININE: 0.6 mg/dL (ref 0.44–1.00)

## 2018-05-05 MED ORDER — IOPAMIDOL (ISOVUE-370) INJECTION 76%
100.0000 mL | Freq: Once | INTRAVENOUS | Status: AC | PRN
  Administered 2018-05-05: 100 mL via INTRAVENOUS

## 2018-05-08 ENCOUNTER — Ambulatory Visit (INDEPENDENT_AMBULATORY_CARE_PROVIDER_SITE_OTHER): Payer: PPO | Admitting: Vascular Surgery

## 2018-05-08 ENCOUNTER — Encounter (INDEPENDENT_AMBULATORY_CARE_PROVIDER_SITE_OTHER): Payer: Self-pay | Admitting: Vascular Surgery

## 2018-05-08 VITALS — BP 131/80 | HR 80 | Ht 61.0 in | Wt 139.0 lb

## 2018-05-08 DIAGNOSIS — I739 Peripheral vascular disease, unspecified: Secondary | ICD-10-CM | POA: Diagnosis not present

## 2018-05-08 DIAGNOSIS — I10 Essential (primary) hypertension: Secondary | ICD-10-CM

## 2018-05-08 DIAGNOSIS — Z85831 Personal history of malignant neoplasm of soft tissue: Secondary | ICD-10-CM | POA: Insufficient documentation

## 2018-05-08 DIAGNOSIS — C499 Malignant neoplasm of connective and soft tissue, unspecified: Secondary | ICD-10-CM

## 2018-05-08 DIAGNOSIS — I2510 Atherosclerotic heart disease of native coronary artery without angina pectoris: Secondary | ICD-10-CM | POA: Diagnosis not present

## 2018-05-08 DIAGNOSIS — I6523 Occlusion and stenosis of bilateral carotid arteries: Secondary | ICD-10-CM | POA: Diagnosis not present

## 2018-05-10 ENCOUNTER — Encounter (INDEPENDENT_AMBULATORY_CARE_PROVIDER_SITE_OTHER): Payer: Self-pay | Admitting: Vascular Surgery

## 2018-05-10 NOTE — Progress Notes (Signed)
MRN : [id number]  Misty Page is a 77 y.o. (08-16-1940) female who presents with chief complaint of  Chief Complaint  Patient presents with  . Follow-up    CT results  .  History of Present Illness:   The patient returns to the office for follow-up and review of her CT scan of the lower extremities.  She notes that the mass is unchanged he continues to be painful particularly if it is touched or she bumps into something.  She has not noticed any further ecchymoses.  She denies fever or chills.  CT scan is reviewed with the patient.  Current Meds  Medication Sig  . Ascorbic Acid (VITAMIN C) 1000 MG tablet Take 500 mg by mouth daily.   . B COMPLEX VITAMINS PO Take 1 tablet by mouth daily. B COMPLEX (Oral Capsule)  1 Every Day for 0 days  Quantity: 0.00;  Refills: 0   Ordered :13-Apr-2010  Celene Kras, MA, Anastasiya ;  Started 29-October-2008 Active Comments: DX: 414.00  . Calcium-Vitamin D-Vitamin K 500-100-40 MG-UNT-MCG CHEW Chew 1 tablet by mouth 2 (two) times daily. VIACTIV, 500-100-40 (Oral Tablet Chewable)  1 tablet twice a day for 0 days  Quantity: 0.00;  Refills: 0   Ordered :13-Apr-2010  Celene Kras, MA, Anastasiya ;  Started 29-October-2008 Active Comments: DX: 733.00  . clopidogrel (PLAVIX) 75 MG tablet Take 1 tablet (75 mg total) by mouth daily.  . Coenzyme Q10 (COQ10) 200 MG CAPS Take 1 capsule by mouth at bedtime.  . Cranberry 300 MG tablet Take 300 mg by mouth daily.   . famotidine (PEPCID) 40 MG tablet TAKE ONE (1) TABLET BY MOUTH EVERY DAY  . ferrous sulfate 325 (65 FE) MG EC tablet Take 325 mg by mouth daily. FERROUS SULFATE, 325 (65 Fe)MG (Oral Tablet Delayed Release)  1 every day for 0 days  Quantity: 0.00;  Refills: 0   Ordered :13-Apr-2010  Celene Kras, MA, Anastasiya ;  Started 29-October-2008 Active Comments: DX: 285.9  . fexofenadine (ALLEGRA) 180 MG tablet Take 180 mg by mouth at bedtime.   . fluticasone (FLONASE) 50 MCG/ACT nasal spray USE 2 SPRAYS EACH NOSTRIL DAILY    . hydrochlorothiazide (HYDRODIURIL) 25 MG tablet TAKE ONE (1) TABLET EACH DAY  . hyoscyamine (LEVSIN, ANASPAZ) 0.125 MG tablet Take by mouth.  . losartan (COZAAR) 100 MG tablet TAKE ONE (1) TABLET EACH DAY  . Mesalamine (ASACOL HD) 800 MG TBEC TAKE 2 TABLETS BY MOUTH TWICE A DAY.  . methimazole (TAPAZOLE) 5 MG tablet Take 2.5 mg by mouth.   . montelukast (SINGULAIR) 10 MG tablet TAKE ONE TABLET AT BEDTIME  . Multiple Vitamins-Minerals (CENTRUM SILVER PO) Take 1 capsule by mouth daily.  . rosuvastatin (CRESTOR) 20 MG tablet Take 0.5 tablets (10 mg total) by mouth daily.  Marland Kitchen triamcinolone cream (KENALOG) 0.1 %     Past Medical History:  Diagnosis Date  . Allergy   . Anemia   . Arthritis   . Colitis   . Complication of anesthesia    nausea  . Environmental and seasonal allergies   . Fibrocystic breast disease (FCBD)   . GERD (gastroesophageal reflux disease)   . High cholesterol   . History of heart artery stent   . Hyperlipidemia   . Hypertension   . Myocardial infarction ([place])   . Osteoporosis   . Thyroid disease   . TIA (transient ischemic attack)     Past Surgical History:  Procedure Laterality Date  . ABDOMINAL HYSTERECTOMY  2007  . BREAST ENHANCEMENT SURGERY  2004  . BREAST IMPLANT REMOVAL    . BREAST SURGERY  1984   Fibrocystic Disease  . CAROTID ENDARTERECTOMY Left 06/21/2014   Dr. Delana Meyer  . CAROTID ENDARTERECTOMY Right 08/02/2014   Dr. Delana Meyer  . CAROTID PTA/STENT INTERVENTION N/A 06/15/2017   Procedure: CAROTID PTA/STENT INTERVENTION;  Surgeon: Katha Cabal, MD;  Location: [facility];  Service: Cardiovascular;  Laterality: N/A;  . COLONOSCOPY WITH PROPOFOL N/A 07/14/2015   Procedure: COLONOSCOPY WITH PROPOFOL;  Surgeon: Hulen Luster, MD;  Location: [facility] ENDOSCOPY;  Service: Gastroenterology;  Laterality: N/A;  . COLONOSCOPY WITH PROPOFOL N/A 02/08/2018   Procedure: COLONOSCOPY WITH PROPOFOL;  Surgeon: Toledo, Benay Pike, MD;  Location: [facility] ENDOSCOPY;   Service: Gastroenterology;  Laterality: N/A;  . CORONARY ANGIOPLASTY WITH STENT PLACEMENT  1999  . ESOPHAGOGASTRODUODENOSCOPY (EGD) WITH PROPOFOL N/A 02/08/2018   Procedure: ESOPHAGOGASTRODUODENOSCOPY (EGD) WITH PROPOFOL;  Surgeon: Toledo, Benay Pike, MD;  Location: [facility] ENDOSCOPY;  Service: Gastroenterology;  Laterality: N/A;  . FEMORAL ARTERY STENT  08/2003  . HERNIA REPAIR  2008  . RENAL ARTERY STENT  08/2003  . TRANSUMBILICAL AUGMENTATION MAMMAPLASTY      Social History Social History   Tobacco Use  . Smoking status: Former Smoker    Last attempt to quit: 05/30/1994    Years since quitting: 23.9  . Smokeless tobacco: Never Used  Substance Use Topics  . Alcohol use: Yes    Comment: rare  . Drug use: No    Family History Family History  Problem Relation Age of Onset  . Hyperlipidemia Mother   . Congestive Heart Failure Mother   . COPD Mother   . Heart disease Mother   . Hypertension Mother   . Osteoporosis Mother   . Pancreatic cancer Father   . Arthritis Sister   . Alzheimer's disease Sister   . Hypertension Sister   . Prostate cancer Brother   . Heart attack Brother   . Stomach cancer Brother   . Kidney failure Brother   . Leukemia Brother   . Emphysema Brother   . Pancreatic cancer Brother   . Stomach cancer Brother   . Breast cancer Paternal Grandmother   . Leukemia Paternal Grandfather   . Heart attack Maternal Uncle   . Stroke Maternal Aunt     Allergies  Allergen Reactions  . Amoxicillin Anaphylaxis  . Naproxen Hives, Shortness Of Breath, Nausea And Vomiting and Swelling  . Penicillins Shortness Of Breath, Swelling and Rash    Has patient had a PCN reaction causing immediate rash, facial/tongue/throat swelling, SOB or lightheadedness with hypotension: Yes Has patient had a PCN reaction causing severe rash involving mucus membranes or skin necrosis: Yes Has patient had a PCN reaction that required hospitalization: Yes Has patient had a PCN reaction  occurring within the last 10 years: No  If all of the above answers are "NO", then may proceed with Cephalosporin use.   . Codeine Nausea And Vomiting  . Levofloxacin Nausea And Vomiting  . Shellfish Allergy Hives, Diarrhea, Nausea And Vomiting and Swelling     REVIEW OF SYSTEMS (Negative unless checked)  Constitutional: [] Weight loss  [] Fever  [] Chills Cardiac: [] Chest pain   [] Chest pressure   [] Palpitations   [] Shortness of breath when laying flat   [] Shortness of breath with exertion. Vascular:  [x] Pain in legs with walking   [x] Pain in legs at rest  [] History of DVT   [] Phlebitis   [x] Swelling in legs   []   Varicose veins   [] Non-healing ulcers Pulmonary:   [] Uses home oxygen   [] Productive cough   [] Hemoptysis   [] Wheeze  [] COPD   [] Asthma Neurologic:  [] Dizziness   [] Seizures   [] History of stroke   [x] History of TIA  [] Aphasia   [] Vissual changes   [] Weakness or numbness in arm   [] Weakness or numbness in leg Musculoskeletal:   [] Joint swelling   [] Joint pain   [] Low back pain Hematologic:  [] Easy bruising  [] Easy bleeding   [] Hypercoagulable state   [] Anemic Gastrointestinal:  [] Diarrhea   [] Vomiting  [] Gastroesophageal reflux/heartburn   [] Difficulty swallowing. Genitourinary:  [] Chronic kidney disease   [] Difficult urination  [] Frequent urination   [] Blood in urine Skin:  [] Rashes   [] Ulcers  Psychological:  [] History of anxiety   []  History of major depression.  Physical Examination  Vitals:   05/08/18 1011 05/08/18 1012  BP: (!) 151/78 131/80  Pulse: 80   Weight: 139 lb (63 kg)   Height: 5' 1"  (1.549 m)    Body mass index is 26.26 kg/m. Gen: WD/WN, NAD Head: [place]/AT, No temporalis wasting.  Ear/Nose/Throat: Hearing grossly intact, nares w/o erythema or drainage Eyes: PER, EOMI, sclera nonicteric.  Neck: Supple, no large masses.   Pulmonary:  Good air movement, no audible wheezing bilaterally, no use of accessory muscles.  Cardiac: RRR, no JVD Vascular: Left thigh  mass unchanged in size compared with last visit.  It remains tender to palpation. Vessel Right Left  Radial Palpable Palpable  Gastrointestinal: Non-distended. No guarding/no peritoneal signs.  Musculoskeletal: M/S 5/5 throughout.  Left thigh deformity no atrophy.  Neurologic: CN 2-12 intact. Symmetrical.  Speech is fluent. Motor exam as listed above. Psychiatric: Judgment intact, Mood & affect appropriate for pt's clinical situation. Dermatologic: No rashes or ulcers noted.  No changes consistent with cellulitis. Lymph : No lichenification or skin changes of chronic lymphedema.  CBC Lab Results  Component Value Date   WBC 10.6 06/16/2017   HGB 12.1 06/16/2017   HCT 35.7 06/16/2017   MCV 94.4 06/16/2017   PLT 272 06/16/2017    BMET    Component Value Date/Time   NA 137 06/16/2017 0628   NA 140 06/28/2016 0909   NA 140 06/22/2014 0417   K 3.4 (L) 06/16/2017 0628   K 3.5 06/22/2014 0417   CL 104 06/16/2017 0628   CL 105 06/22/2014 0417   CO2 24 06/16/2017 0628   CO2 27 06/22/2014 0417   GLUCOSE 145 (H) 06/16/2017 0628   GLUCOSE 92 06/22/2014 0417   BUN 11 06/16/2017 0628   BUN 16 06/28/2016 0909   BUN 9 06/22/2014 0417   CREATININE 0.60 05/05/2018 0914   CREATININE 0.65 06/22/2014 0417   CALCIUM 8.1 (L) 06/16/2017 0628   CALCIUM 7.7 (L) 06/22/2014 0417   GFRNONAA >60 06/16/2017 0628   GFRNONAA >60 06/22/2014 0417   GFRNONAA >60 02/26/2013 1510   GFRAA >60 06/16/2017 0628   GFRAA >60 06/22/2014 0417   GFRAA >60 02/26/2013 1510   Estimated Creatinine Clearance: 50.1 mL/min (by C-G formula based on SCr of 0.6 mg/dL).  COAG Lab Results  Component Value Date   INR 1.0 06/22/2014    Radiology Ct Angio Ao+bifem W & Or Wo Contrast  Result Date: 05/05/2018 CLINICAL DATA:  77 year old female with a history of lateral thigh swelling EXAM: CT ANGIOGRAPHY OF ABDOMINAL AORTA WITH ILIOFEMORAL RUNOFF TECHNIQUE: Multidetector CT imaging of the abdomen, pelvis and lower  extremities was performed using the standard protocol during bolus  administration of intravenous contrast. Multiplanar CT image reconstructions and MIPs were obtained to evaluate the vascular anatomy. CONTRAST:  140m ISOVUE-370 IOPAMIDOL (ISOVUE-370) INJECTION 76% COMPARISON:  06/17/2010, 07/29/2009, 07/14/2009 FINDINGS: VASCULAR Aorta: Mixed calcified and soft plaque of the abdominal aorta, mild to moderate severity. No dissection. Stent of the infrarenal abdominal aorta, with telescoping iliac/kissing iliac stents to the common iliac arteries. Celiac: Advanced atherosclerosis of the celiac artery origin, with what appears to be high-grade stenosis on both axial and sagittal images. SMA: Moderate atherosclerotic changes at the origin of the superior mesenteric artery with no evidence of high-grade stenosis. Renals: Atherosclerotic changes at the origin of the left renal artery with stent in place. Stent remains patent. Moderate atherosclerotic changes at the origin of the right renal artery with estimated 50% narrowing. IMA: The IMA has been covered by stent and appears patent via collateral flow. Right lower extremity: Patent right iliac stent, terminating beyond the hypogastric artery origin. Hypogastric artery remains patent. External iliac artery patent with no significant atherosclerosis. Mild atherosclerosis of the common femoral artery. Profunda femoris patent, with patent thigh branches. Moderate atherosclerosis of the superficial femoral artery, most pronounced in the adductor canal. No high-grade stenosis or occlusion identified. Moderate popliteal atherosclerosis, with patency maintained. Origin of the anterior tibial artery is patent, with the AT appearing patent to the ankle. Tibioperoneal trunk patent with atherosclerotic changes. Peroneal artery and posterior tibial artery appear patent to the ankle. Left lower extremity: Patent left iliac stent terminating in the common iliac artery above the  bifurcation. Hypogastric artery appears patent, though non stenotic at the origin. External iliac artery patent without significant atherosclerosis. Common femoral artery with mild atherosclerotic changes. Profunda femoris patent with patent thigh branches. Moderate atherosclerotic changes of the superficial femoral artery without occlusion. Calcifications through the adductor canal. Popliteal artery with moderate to advanced atherosclerotic changes. Popliteal artery appears patent though potentially 50% narrowed at the joint. Anterior tibial artery patent proximally and occluded distally. Tibioperoneal trunk patent with moderate atherosclerotic changes. Posterior tibial artery patent from the origin to the ankle. Peroneal artery patent from the origin to the ankle. Significant arterial contribution to left thigh soft tissue mass from the profunda femoris branches and inferiorly from the geniculate branches. Early venous shunting through the soft tissue mass. Veins: Unremarkable appearance of the venous system. Review of the MIP images confirms the above findings. NON-VASCULAR Lower chest: No acute. Hepatobiliary: Unremarkable appearance of the liver. Unremarkable gall bladder. Pancreas: Unremarkable pancreas Spleen: Unremarkable spleen Adrenals/Urinary Tract: Unremarkable appearance of adrenal glands. Right: No hydronephrosis. Symmetric perfusion to the left. No nephrolithiasis. Unremarkable course of the right ureter. Left: No hydronephrosis. Symmetric perfusion to the right. Nonobstructive stone at the inferior collecting system of the left kidney. Unremarkable course of the left ureter. Cyst of the inferior aspect left kidney measures 3.2 cm. Unremarkable appearance of the urinary bladder . Stomach/Bowel: Unremarkable appearance of the stomach. Unremarkable appearance of small bowel. No evidence of obstruction. Colonic diverticula without evidence of acute diverticulitis. Normal appendix Lymphatic: Multiple  lymph nodes in the para-aortic nodal station, none of which are enlarged. Mesenteric: No free fluid or air. No adenopathy. Reproductive: Hysterectomy Other: No hernia. Musculoskeletal: Vascular mass of the left quadriceps, measuring 16.7 cm cranial caudal dimension, 7.7 cm AP, 6.0 cm left-to-right in the mid thigh. This appears to involve the vastus intermedius, potentially vastus lateralis. Hypervascular flow. Near circumferential growth about the femur. No periosteal reaction or erosive changes on the femur. Degenerative changes of the spine including  vacuum disc phenomenon at L5-S1. 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 These results were called by telephone at the time of interpretation on 05/05/2018 at 10:35 am to Dr. Lorenda Ishihara PA, Ms Hezzie Bump. Multilevel atherosclerosis, including: -aortic atherosclerosis, status post treatment for aortoiliac disease with lower aortic stent with telescoping iliac kissing stents. -moderate right femoropopliteal disease with calcified plaque of the SFA in the abductor canal, calcified plaque of the popliteal artery, both patent. -mild right tibial disease, with all 3 arteries patent to the ankle -moderate left femoropopliteal disease, with calcified disease in the adductor canal, and estimated 50% narrowing of the popliteal artery at the knee joint -left tibial disease, with apparent distal occlusion of the anterior tibial artery and patency of peroneal and posterior tibial artery to the ankle. 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 Bilateral renal artery disease, with estimated 50% narrowing on the right, and patent stent on the left. Diverticular disease without evidence of acute diverticulitis. Signed, Dulcy Fanny. Dellia Nims, RPVI  [facility] Radiology Electronically Signed   By: Corrie Mckusick D.O.   On: 05/05/2018 10:40   US Venous Img Lower Unilateral Left  Result Date: 04/25/2018 CLINICAL DATA:  Left leg swelling for 1 week EXAM: LEFT LOWER EXTREMITY VENOUS DUPLEX ULTRASOUND TECHNIQUE: Doppler venous assessment of the left lower extremity deep venous system was performed, including characterization of spectral flow, compressibility, and phasicity. COMPARISON:  None. FINDINGS: There is complete compressibility of the left common femoral, femoral, and popliteal veins. Doppler analysis demonstrates respiratory phasicity and augmentation of flow with calf compression. No obvious superficial vein or calf vein thrombosis. IMPRESSION: No evidence of left lower extremity DVT. Electronically Signed   By: Marybelle Killings M.D.   On: 04/25/2018 16:36      Assessment/Plan 1. 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  Findings demonstrate a hypervascular mass most consistent with a sarcoma.  It does not appear to be an abscess or a hematoma.  I have called and spoken directly with Dr. Grayland Ormond and we will plan to have her seen at the [facility] cancer center where plans for a proper biopsy and subsequent care plan will be made.   A total of 30 minutes was spent with this patient and greater than 50% was spent in counseling and coordination of care with the patient.  Discussion included the treatment options for vascular disease including indications for surgery and intervention.  Also discussed is the appropriate timing of treatment.  In addition medical therapy was discussed.  - Ambulatory referral to Oncology  2. Peripheral vascular disease ([place])  Recommend:  The patient has evidence of atherosclerosis of the lower extremities with claudication.  The patient does not voice lifestyle limiting changes at this point in  time.  Recent noninvasive studies do not suggest clinically significant change.  The patient has adequate circulation to undergo what ever necessary treatment options are indicated in the event that this is biopsy-proven sarcoma.  She will have adequate healing potential if surgery is required.  No invasive studies, angiography or surgery at this time The patient should continue walking and begin a more formal exercise program.  The patient should continue antiplatelet therapy and aggressive treatment of the lipid  abnormalities  No changes in the patient's medications at this time  The patient should continue wearing graduated compression socks 10-15 mmHg strength to control the mild edema.    3. Bilateral carotid artery stenosis Recommend:  Given the patient's asymptomatic subcritical stenosis no further invasive testing or surgery at this time.  Duplex ultrasound shows less than 60% stenosis bilaterally.  Continue antiplatelet therapy as prescribed Continue management of CAD, HTN and Hyperlipidemia Healthy heart diet,  encouraged exercise at least 4 times per week Follow up 81-monthintervals with duplex ultrasound and physical exam   4. Arteriosclerosis of coronary artery Continue cardiac and antihypertensive medications as already ordered and reviewed, no changes at this time.  Continue statin as ordered and reviewed, no changes at this time  Nitrates PRN for chest pain   5. Essential (primary) hypertension Continue antihypertensive medications as already ordered, these medications have been reviewed and there are no changes at this time.     GHortencia Pilar MD  05/10/2018 9:39 AM

## 2018-05-14 NOTE — Progress Notes (Signed)
[place]  [phone] [phone]) [phone]  ID: [name] OB: 1940-12-02  MR#: [id number]  OEU#:[id number]  Patient Care Team: Virginia Crews, MD as PCP - General (Family Medicine) Delana Meyer, Dolores Lory, MD as Consulting Physician (Vascular Surgery) Corey Skains, MD as Consulting Physician (Cardiology) Lonia Farber, MD as Consulting Physician (Internal Medicine) Poggi, Marshall Cork, MD as Consulting Physician (Surgery) Corey Skains, MD as Consulting Physician (Cardiology) Bertram Gala as Physician Assistant (Gastroenterology) Mar Daring, [name] as Physician Assistant (Family Medicine)  CHIEF COMPLAINT: Sarcoma  INTERVAL HISTORY: Patient is a 77 year old female who noticed increased swelling in her left thigh over the past several weeks.  Patient was initially thought to have a DVT or possible hematoma, but further imaging revealed a mass suspicious for underlying sarcoma.  She otherwise feels well.  She has no neurologic complaints.  She denies any recent fevers or illnesses.  She has a good appetite and denies weight loss.  She has no chest pain or shortness of breath.  She denies any nausea, vomiting, constipation, or diarrhea.  She has no melena or hematochezia.  She has no urinary complaints.  Patient otherwise feels well and offers no further specific complaints.  REVIEW OF SYSTEMS:   Review of Systems  Constitutional: Negative.  Negative for fever, malaise/fatigue and weight loss.  Respiratory: Negative.  Negative for cough, hemoptysis and shortness of breath.   Cardiovascular: Positive for leg swelling. Negative for chest pain.  Gastrointestinal: Negative.  Negative for abdominal pain, blood in stool and melena.  Genitourinary: Negative.  Negative for dysuria and flank pain.  Musculoskeletal: Negative.  Negative for back pain.  Skin: Negative.  Negative for rash.  Neurological: Negative.  Negative for focal  weakness, weakness and headaches.  Psychiatric/Behavioral: Negative.  The patient is not nervous/anxious.     As per HPI. Otherwise, a complete review of systems is negative.  PAST MEDICAL HISTORY: Past Medical History:  Diagnosis Date  . Allergy   . Anemia   . Arthritis   . Colitis   . Complication of anesthesia    nausea  . Environmental and seasonal allergies   . Fibrocystic breast disease (FCBD)   . GERD (gastroesophageal reflux disease)   . High cholesterol   . History of heart artery stent   . Hyperlipidemia   . Hypertension   . Myocardial infarction ([place])   . Osteoporosis   . Thyroid disease   . TIA (transient ischemic attack)     PAST SURGICAL HISTORY: Past Surgical History:  Procedure Laterality Date  . ABDOMINAL HYSTERECTOMY  2007  . BREAST ENHANCEMENT SURGERY  2004  . BREAST IMPLANT REMOVAL    . BREAST SURGERY  1984   Fibrocystic Disease  . CAROTID ENDARTERECTOMY Left 06/21/2014   Dr. Delana Meyer  . CAROTID ENDARTERECTOMY Right 08/02/2014   Dr. Delana Meyer  . CAROTID PTA/STENT INTERVENTION N/A 06/15/2017   Procedure: CAROTID PTA/STENT INTERVENTION;  Surgeon: Katha Cabal, MD;  Location: [facility];  Service: Cardiovascular;  Laterality: N/A;  . COLONOSCOPY WITH PROPOFOL N/A 07/14/2015   Procedure: COLONOSCOPY WITH PROPOFOL;  Surgeon: Hulen Luster, MD;  Location: [facility] ENDOSCOPY;  Service: Gastroenterology;  Laterality: N/A;  . COLONOSCOPY WITH PROPOFOL N/A 02/08/2018   Procedure: COLONOSCOPY WITH PROPOFOL;  Surgeon: Toledo, Benay Pike, MD;  Location: [facility] ENDOSCOPY;  Service: Gastroenterology;  Laterality: N/A;  . CORONARY ANGIOPLASTY WITH STENT PLACEMENT  1999  . ESOPHAGOGASTRODUODENOSCOPY (EGD) WITH PROPOFOL N/A 02/08/2018  Procedure: ESOPHAGOGASTRODUODENOSCOPY (EGD) WITH PROPOFOL;  Surgeon: Toledo, Benay Pike, MD;  Location: [facility] ENDOSCOPY;  Service: Gastroenterology;  Laterality: N/A;  . FEMORAL ARTERY STENT  08/2003  . HERNIA REPAIR  2008  . RENAL  ARTERY STENT  08/2003  . TRANSUMBILICAL AUGMENTATION MAMMAPLASTY      FAMILY HISTORY: Family History  Problem Relation Age of Onset  . Hyperlipidemia Mother   . Congestive Heart Failure Mother   . COPD Mother   . Heart disease Mother   . Hypertension Mother   . Osteoporosis Mother   . Pancreatic cancer Father   . Arthritis Sister   . Alzheimer's disease Sister   . Hypertension Sister   . Prostate cancer Brother   . Heart attack Brother   . Stomach cancer Brother   . Kidney failure Brother   . Leukemia Brother   . Emphysema Brother   . Pancreatic cancer Brother   . Stomach cancer Brother   . Breast cancer Paternal Grandmother   . Leukemia Paternal Grandfather   . Heart attack Maternal Uncle   . Stroke Maternal Aunt     ADVANCED DIRECTIVES (Y/N):  N  HEALTH MAINTENANCE: Social History   Tobacco Use  . Smoking status: Former Smoker    Last attempt to quit: 05/30/1994    Years since quitting: 23.9  . Smokeless tobacco: Never Used  Substance Use Topics  . Alcohol use: Yes    Comment: rare  . Drug use: No     Colonoscopy:  PAP:  Bone density:  Lipid panel:  Allergies  Allergen Reactions  . Amoxicillin Anaphylaxis  . Naproxen Hives, Shortness Of Breath, Nausea And Vomiting and Swelling  . Penicillins Shortness Of Breath, Swelling and Rash    Has patient had a PCN reaction causing immediate rash, facial/tongue/throat swelling, SOB or lightheadedness with hypotension: Yes Has patient had a PCN reaction causing severe rash involving mucus membranes or skin necrosis: Yes Has patient had a PCN reaction that required hospitalization: Yes Has patient had a PCN reaction occurring within the last 10 years: No  If all of the above answers are "NO", then may proceed with Cephalosporin use.   . Codeine Nausea And Vomiting  . Levofloxacin Nausea And Vomiting  . Shellfish Allergy Hives, Diarrhea, Nausea And Vomiting and Swelling    Current Outpatient Medications    Medication Sig Dispense Refill  . Ascorbic Acid (VITAMIN C) 1000 MG tablet Take 500 mg by mouth daily.     . B COMPLEX VITAMINS PO Take 1 tablet by mouth daily. B COMPLEX (Oral Capsule)  1 Every Day for 0 days  Quantity: 0.00;  Refills: 0   Ordered :13-Apr-2010  Celene Kras, MA, Anastasiya ;  Started 29-October-2008 Active Comments: DX: 414.00    . Calcium-Vitamin D-Vitamin K 500-100-40 MG-UNT-MCG CHEW Chew 1 tablet by mouth 2 (two) times daily. VIACTIV, 500-100-40 (Oral Tablet Chewable)  1 tablet twice a day for 0 days  Quantity: 0.00;  Refills: 0   Ordered :13-Apr-2010  Celene Kras, MA, Anastasiya ;  Started 29-October-2008 Active Comments: DX: 733.00    . clopidogrel (PLAVIX) 75 MG tablet Take 1 tablet (75 mg total) by mouth daily. 30 tablet 5  . Coenzyme Q10 (COQ10) 200 MG CAPS Take 1 capsule by mouth at bedtime.    . Cranberry 300 MG tablet Take 300 mg by mouth daily.     . famotidine (PEPCID) 40 MG tablet TAKE ONE (1) TABLET BY MOUTH EVERY DAY 90 tablet 1  . ferrous sulfate 325 (  65 FE) MG EC tablet Take 325 mg by mouth daily. FERROUS SULFATE, 325 (65 Fe)MG (Oral Tablet Delayed Release)  1 every day for 0 days  Quantity: 0.00;  Refills: 0   Ordered :13-Apr-2010  Celene Kras, MA, Anastasiya ;  Started 29-October-2008 Active Comments: DX: 285.9    . fexofenadine (ALLEGRA) 180 MG tablet Take 180 mg by mouth at bedtime.     . fluticasone (FLONASE) 50 MCG/ACT nasal spray USE 2 SPRAYS EACH NOSTRIL DAILY 48 g 1  . hydrochlorothiazide (HYDRODIURIL) 25 MG tablet TAKE ONE (1) TABLET EACH DAY 90 tablet 3  . hyoscyamine (LEVSIN, ANASPAZ) 0.125 MG tablet Take by mouth.    . losartan (COZAAR) 100 MG tablet TAKE ONE (1) TABLET EACH DAY 90 tablet 2  . Mesalamine (ASACOL HD) 800 MG TBEC TAKE 2 TABLETS BY MOUTH TWICE A DAY.    . methimazole (TAPAZOLE) 5 MG tablet Take 2.5 mg by mouth.     . montelukast (SINGULAIR) 10 MG tablet TAKE ONE TABLET AT BEDTIME 90 tablet 2  . Multiple Vitamins-Minerals (CENTRUM SILVER PO) Take  1 capsule by mouth daily.    . rosuvastatin (CRESTOR) 20 MG tablet Take 0.5 tablets (10 mg total) by mouth daily. 45 tablet 2  . triamcinolone cream (KENALOG) 0.1 %     . doxycycline (VIBRA-TABS) 100 MG tablet Take 1 tablet (100 mg total) by mouth 2 (two) times daily. (Patient not taking: Reported on 05/01/2018) 14 tablet 0  . doxycycline (VIBRAMYCIN) 100 MG capsule      No current facility-administered medications for this visit.     OBJECTIVE: Vitals:   05/16/18 1143  BP: (!) 150/79  Pulse: 87  Resp: 20  Temp: (!) 97.2 F (36.2 C)     Body mass index is 26.26 kg/m.    ECOG FS:0 - Asymptomatic  General: Well-developed, well-nourished, no acute distress. Eyes: Pink conjunctiva, anicteric sclera. HEENT: Normocephalic, moist mucous membranes, clear oropharnyx. Lungs: Clear to auscultation bilaterally. Heart: Regular rate and rhythm. No rubs, murmurs, or gallops. Abdomen: Soft, nontender, nondistended. No organomegaly noted, normoactive bowel sounds. Musculoskeletal: Increased swelling and circumference of left upper leg.   Neuro: Alert, answering all questions appropriately. Cranial nerves grossly intact. Skin: No rashes or petechiae noted. Psych: Normal affect. Lymphatics: No cervical, calvicular, axillary or inguinal LAD.   LAB RESULTS:  Lab Results  Component Value Date   NA 137 06/16/2017   K 3.4 (L) 06/16/2017   CL 104 06/16/2017   CO2 24 06/16/2017   GLUCOSE 145 (H) 06/16/2017   BUN 11 06/16/2017   CREATININE 0.60 05/05/2018   CALCIUM 8.1 (L) 06/16/2017   PROT 6.7 06/28/2016   ALBUMIN 4.4 06/28/2016   AST 17 06/28/2016   ALT 16 06/28/2016   ALKPHOS 65 06/28/2016   BILITOT 0.3 06/28/2016   GFRNONAA >60 06/16/2017   GFRAA >60 06/16/2017    Lab Results  Component Value Date   WBC 10.6 06/16/2017   NEUTROABS 4.1 06/28/2016   HGB 12.1 06/16/2017   HCT 35.7 06/16/2017   MCV 94.4 06/16/2017   PLT 272 06/16/2017     STUDIES: Ct Angio Ao+bifem W & Or Wo  Contrast  Result Date: 05/05/2018 CLINICAL DATA:  77 year old female with a history of lateral thigh swelling EXAM: CT ANGIOGRAPHY OF ABDOMINAL AORTA WITH ILIOFEMORAL RUNOFF TECHNIQUE: Multidetector CT imaging of the abdomen, pelvis and lower extremities was performed using the standard protocol during bolus administration of intravenous contrast. Multiplanar CT image reconstructions and MIPs were obtained to  evaluate the vascular anatomy. CONTRAST:  111m ISOVUE-370 IOPAMIDOL (ISOVUE-370) INJECTION 76% COMPARISON:  06/17/2010, 07/29/2009, 07/14/2009 FINDINGS: VASCULAR Aorta: Mixed calcified and soft plaque of the abdominal aorta, mild to moderate severity. No dissection. Stent of the infrarenal abdominal aorta, with telescoping iliac/kissing iliac stents to the common iliac arteries. Celiac: Advanced atherosclerosis of the celiac artery origin, with what appears to be high-grade stenosis on both axial and sagittal images. SMA: Moderate atherosclerotic changes at the origin of the superior mesenteric artery with no evidence of high-grade stenosis. Renals: Atherosclerotic changes at the origin of the left renal artery with stent in place. Stent remains patent. Moderate atherosclerotic changes at the origin of the right renal artery with estimated 50% narrowing. IMA: The IMA has been covered by stent and appears patent via collateral flow. Right lower extremity: Patent right iliac stent, terminating beyond the hypogastric artery origin. Hypogastric artery remains patent. External iliac artery patent with no significant atherosclerosis. Mild atherosclerosis of the common femoral artery. Profunda femoris patent, with patent thigh branches. Moderate atherosclerosis of the superficial femoral artery, most pronounced in the adductor canal. No high-grade stenosis or occlusion identified. Moderate popliteal atherosclerosis, with patency maintained. Origin of the anterior tibial artery is patent, with the AT appearing  patent to the ankle. Tibioperoneal trunk patent with atherosclerotic changes. Peroneal artery and posterior tibial artery appear patent to the ankle. Left lower extremity: Patent left iliac stent terminating in the common iliac artery above the bifurcation. Hypogastric artery appears patent, though non stenotic at the origin. External iliac artery patent without significant atherosclerosis. Common femoral artery with mild atherosclerotic changes. Profunda femoris patent with patent thigh branches. Moderate atherosclerotic changes of the superficial femoral artery without occlusion. Calcifications through the adductor canal. Popliteal artery with moderate to advanced atherosclerotic changes. Popliteal artery appears patent though potentially 50% narrowed at the joint. Anterior tibial artery patent proximally and occluded distally. Tibioperoneal trunk patent with moderate atherosclerotic changes. Posterior tibial artery patent from the origin to the ankle. Peroneal artery patent from the origin to the ankle. Significant arterial contribution to left thigh soft tissue mass from the profunda femoris branches and inferiorly from the geniculate branches. Early venous shunting through the soft tissue mass. Veins: Unremarkable appearance of the venous system. Review of the MIP images confirms the above findings. NON-VASCULAR Lower chest: No acute. Hepatobiliary: Unremarkable appearance of the liver. Unremarkable gall bladder. Pancreas: Unremarkable pancreas Spleen: Unremarkable spleen Adrenals/Urinary Tract: Unremarkable appearance of adrenal glands. Right: No hydronephrosis. Symmetric perfusion to the left. No nephrolithiasis. Unremarkable course of the right ureter. Left: No hydronephrosis. Symmetric perfusion to the right. Nonobstructive stone at the inferior collecting system of the left kidney. Unremarkable course of the left ureter. Cyst of the inferior aspect left kidney measures 3.2 cm. Unremarkable appearance of  the urinary bladder . Stomach/Bowel: Unremarkable appearance of the stomach. Unremarkable appearance of small bowel. No evidence of obstruction. Colonic diverticula without evidence of acute diverticulitis. Normal appendix Lymphatic: Multiple lymph nodes in the para-aortic nodal station, none of which are enlarged. Mesenteric: No free fluid or air. No adenopathy. Reproductive: Hysterectomy Other: No hernia. Musculoskeletal: Vascular mass of the left quadriceps, measuring 16.7 cm cranial caudal dimension, 7.7 cm AP, 6.0 cm left-to-right in the mid thigh. This appears to involve the vastus intermedius, potentially vastus lateralis. Hypervascular flow. Near circumferential growth about the femur. No periosteal reaction or erosive changes on the femur. Degenerative changes of the spine including vacuum disc phenomenon at L5-S1. 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 These results were called by telephone at the time of interpretation on 05/05/2018 at 10:35 am to Dr. Lorenda Ishihara PA, Ms Hezzie Bump. Multilevel atherosclerosis, including: -aortic atherosclerosis, status post treatment for aortoiliac disease with lower aortic stent with telescoping iliac kissing stents. -moderate right femoropopliteal disease with calcified plaque of the SFA in the abductor canal, calcified plaque of the popliteal artery, both patent. -mild right tibial disease, with all 3 arteries patent to the ankle -moderate left femoropopliteal disease, with calcified disease in the adductor canal, and estimated 50% narrowing of the popliteal artery at the knee joint -left tibial disease, with apparent distal occlusion of the anterior tibial artery and patency of peroneal and posterior tibial artery to the ankle. 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 Bilateral renal artery disease, with estimated 50% narrowing on the right, and patent stent on the left. Diverticular disease without evidence of acute diverticulitis. Signed, Dulcy Fanny. Dellia Nims, RPVI [facility] Radiology Electronically Signed   By: Corrie Mckusick D.O.   On: 05/05/2018 10:40   Nm Pet Image Initial (pi) Skull Base To Thigh  Result Date: 05/17/2018 CLINICAL DATA:  Initial treatment strategy for left thigh mass. EXAM: NUCLEAR MEDICINE PET SKULL BASE TO THIGH TECHNIQUE: 7.7 mCi F-18 FDG was injected intravenously. Full-ring PET imaging was performed from the skull base to thigh after the radiotracer. CT data was obtained and used for attenuation correction and anatomic localization. Fasting blood glucose: 90 mg/dl COMPARISON:  CT scan 05/05/2018 FINDINGS: Mediastinal blood pool activity: SUV max 2.38 NECK: No hypermetabolic lymph nodes in the neck. Incidental CT findings: Multinodular thyroid goiter. CHEST: No enlarged or hypermetabolic mediastinal or hilar lymph nodes. No supraclavicular or axillary adenopathy. No worrisome pulmonary nodules to suggest pulmonary metastatic disease. Incidental CT findings: Mild cardiac enlargement and advanced atherosclerotic calcifications involving the thoracic aorta and branch vessels including the coronary arteries. Bilateral breast prosthesis. ABDOMEN/PELVIS: No abnormal hypermetabolic activity within the liver, pancreas, adrenal glands, or spleen. No hypermetabolic lymph nodes in the abdomen or pelvis. Incidental CT findings: Advanced atherosclerotic calcifications involving the aorta and iliac arteries with aortoiliac bypass graft. There are also bilateral renal artery stents. Small bilateral renal calculi and renal cysts. SKELETON: The large hypervascular necrotic appearing left thigh mass in the anterior compartment demonstrates marked hypermetabolism with SUV  max of 11.28 and consistent with a neoplastic process. No involvement of the left femur is identified. No inguinal or popliteal lymphadenopathy. No areas of abnormal osseous uptake to suggest metastatic disease. Incidental CT findings: none IMPRESSION: 1. The large hypervascular necrotic appearing left thigh mass is hypermetabolic and consistent with a neoplastic process. 2. No findings for metastatic disease involving the chest, abdomen, pelvis or bony structures. Electronically Signed   By: Marijo Sanes M.D.   On: 05/17/2018 13:23   US Venous Img Lower Unilateral Left  Result Date: 04/25/2018 CLINICAL DATA:  Left leg swelling for 1 week EXAM: LEFT LOWER EXTREMITY VENOUS DUPLEX ULTRASOUND TECHNIQUE: Doppler venous assessment of the left lower extremity deep venous system was performed, including characterization of spectral flow, compressibility, and phasicity. COMPARISON:  None. FINDINGS: There is complete compressibility of the left common femoral, femoral, and popliteal veins. Doppler analysis demonstrates respiratory phasicity and augmentation of flow with calf compression. No obvious superficial vein or calf vein  thrombosis. IMPRESSION: No evidence of left lower extremity DVT. Electronically Signed   By: Marybelle Killings M.D.   On: 04/25/2018 16:36    ASSESSMENT: Sarcoma  PLAN:    1. Sarcoma: CT scan results from May 05, 2018 as well as PET scan results from May 17, 2018 reviewed independently and report as above revealing a large hypervascular necrotic mass in the left thigh consistent with malignancy, possibly sarcoma.  She has no other evidence of active disease.  Patient will require biopsy to confirm the diagnosis.  She will return to clinic approximately 1 week after biopsy to discuss the results and treatment planning which will likely include chemotherapy and radiation therapy.  I spent a total of 60 minutes face-to-face with the patient of which greater than 50% of the visit was  spent in counseling and coordination of care as detailed above.   Patient expressed understanding and was in agreement with this plan. She also understands that She can call clinic at any time with any questions, concerns, or complaints.   Cancer Staging No matching staging information was found for the patient.  Lloyd Huger, MD   05/18/2018 12:10 PM

## 2018-05-15 DIAGNOSIS — M75111 Incomplete rotator cuff tear or rupture of right shoulder, not specified as traumatic: Secondary | ICD-10-CM | POA: Diagnosis not present

## 2018-05-15 DIAGNOSIS — M7581 Other shoulder lesions, right shoulder: Secondary | ICD-10-CM | POA: Diagnosis not present

## 2018-05-16 ENCOUNTER — Other Ambulatory Visit: Payer: Self-pay

## 2018-05-16 ENCOUNTER — Inpatient Hospital Stay: Payer: PPO | Attending: Oncology | Admitting: Oncology

## 2018-05-16 ENCOUNTER — Encounter: Payer: Self-pay | Admitting: Oncology

## 2018-05-16 VITALS — BP 150/79 | HR 87 | Temp 97.2°F | Resp 20 | Wt 139.0 lb

## 2018-05-16 DIAGNOSIS — C4922 Malignant neoplasm of connective and soft tissue of left lower limb, including hip: Secondary | ICD-10-CM | POA: Insufficient documentation

## 2018-05-16 DIAGNOSIS — C499 Malignant neoplasm of connective and soft tissue, unspecified: Secondary | ICD-10-CM

## 2018-05-16 NOTE — Progress Notes (Signed)
Patient here today for initial evaluation regarding sarcoma, referred by Dr. Delana Meyer.

## 2018-05-17 ENCOUNTER — Ambulatory Visit
Admission: RE | Admit: 2018-05-17 | Discharge: 2018-05-17 | Disposition: A | Discharge status: Home / Self Care | Payer: PPO | Source: Ambulatory Visit | Attending: Oncology | Admitting: Oncology

## 2018-05-17 DIAGNOSIS — C499 Malignant neoplasm of connective and soft tissue, unspecified: Secondary | ICD-10-CM | POA: Diagnosis not present

## 2018-05-17 DIAGNOSIS — R2242 Localized swelling, mass and lump, left lower limb: Secondary | ICD-10-CM | POA: Diagnosis not present

## 2018-05-17 LAB — GLUCOSE, CAPILLARY: Glucose-Capillary: 90 mg/dL (ref 70–99)

## 2018-05-17 MED ORDER — FLUDEOXYGLUCOSE F - 18 (FDG) INJECTION
7.2000 | Freq: Once | INTRAVENOUS | Status: AC | PRN
  Administered 2018-05-17: 7.7 via INTRAVENOUS

## 2018-05-18 ENCOUNTER — Telehealth: Payer: Self-pay | Admitting: *Deleted

## 2018-05-18 ENCOUNTER — Other Ambulatory Visit: Payer: PPO

## 2018-05-18 NOTE — Telephone Encounter (Signed)
Patient presented at tumor conference today, recommended that patient be seen at [facility] for second opinion by sarcoma specialist. Referral sent to Dr. Merrie Roof at [facility] per Dr. Gary Fleet request. Patient aware of referral, [facility] to call patient with appointment. Patient also given PET results over the phone, patient verbalized understanding and agreement with plan.

## 2018-05-18 NOTE — Progress Notes (Signed)
Tumor Board Documentation  Misty Page was presented by Dr Grayland Ormond at our Tumor Board on 05/18/2018, which included representatives from medical oncology, radiation oncology, internal medicine, navigation, pathology, radiology, surgical, genetics, nutrition, research, pulmonology.  Misty Page currently presents as a new patient, for discussion, for [place] with history of the following treatments: active survellience.  Additionally, we reviewed previous medical and familial history, history of present illness, and recent lab results along with all available histopathologic and imaging studies. The tumor board considered available treatment options and made the following recommendations: Surgery, Biopsy Referral to tertiary Center with Sarcoma Specialist  The following procedures/referrals were also placed: No orders of the defined types were placed in this encounter.   Clinical Trial Status: not discussed    Tumor board is a meeting of clinicians from various specialty areas who evaluate and discuss patients for whom a multidisciplinary approach is being considered. Final determinations in the plan of care are those of the provider(s). The responsibility for follow up of recommendations given during tumor board is that of the provider.   Today's extended care, comprehensive team conference, Misty Page was not present for the discussion and was not examined.   Multidisciplinary Tumor Board is a multidisciplinary case peer review process.  Decisions discussed in the Multidisciplinary Tumor Board reflect the opinions of the specialists present at the conference without having examined the patient.  Ultimately, treatment and diagnostic decisions rest with the primary provider(s) and the patient.

## 2018-05-23 ENCOUNTER — Telehealth: Payer: Self-pay | Admitting: *Deleted

## 2018-05-23 NOTE — Telephone Encounter (Signed)
Referral was sent on Thursday of last week, will follow up.

## 2018-05-23 NOTE — Telephone Encounter (Signed)
Patient called and reports that she has not heard from [facility] and was told that if she had not heard from them by Tuesday to call our office and let us know

## 2018-05-25 ENCOUNTER — Other Ambulatory Visit: Payer: Self-pay | Admitting: *Deleted

## 2018-05-25 DIAGNOSIS — K219 Gastro-esophageal reflux disease without esophagitis: Secondary | ICD-10-CM

## 2018-05-25 DIAGNOSIS — I10 Essential (primary) hypertension: Secondary | ICD-10-CM

## 2018-05-25 NOTE — Telephone Encounter (Signed)
Izora Gala from [facility] called office requesting the following medications. Montelukast 10 mg, HCTZ 25 mg and Famotidine 40 mg. Please advise?

## 2018-05-26 ENCOUNTER — Other Ambulatory Visit: Payer: Self-pay | Admitting: Family Medicine

## 2018-05-26 DIAGNOSIS — E7800 Pure hypercholesterolemia, unspecified: Secondary | ICD-10-CM

## 2018-05-26 DIAGNOSIS — I10 Essential (primary) hypertension: Secondary | ICD-10-CM

## 2018-05-26 MED ORDER — ROSUVASTATIN CALCIUM 20 MG PO TABS: 10.0000 mg | ORAL_TABLET | Freq: Every day | ORAL | 2 refills | Status: DC

## 2018-05-26 MED ORDER — MONTELUKAST SODIUM 10 MG PO TABS: 10.0000 mg | ORAL_TABLET | Freq: Every day | ORAL | 1 refills | Status: DC

## 2018-05-26 MED ORDER — FAMOTIDINE 40 MG PO TABS: ORAL_TABLET | ORAL | 1 refills | Status: DC

## 2018-05-26 MED ORDER — HYDROCHLOROTHIAZIDE 25 MG PO TABS: ORAL_TABLET | ORAL | 1 refills | Status: DC

## 2018-05-26 MED ORDER — LOSARTAN POTASSIUM 100 MG PO TABS: ORAL_TABLET | ORAL | 2 refills | Status: DC

## 2018-05-26 NOTE — Telephone Encounter (Signed)
We received a fax stating that patient is switching to [facility] due to her old pharmacy closing and they are requesting new prescriptions for the following medications  losartan (COZAAR) 100 MG tablet   rosuvastatin (CRESTOR) 20 MG tablet  Please advise

## 2018-05-26 NOTE — Telephone Encounter (Signed)
I have spoken with hematology/oncology department at [facility], the referral and information has been sent to their new patient coordinator for scheduling. The office states the new patient coordinator will reach out to patient with appointment details. Patient aware.

## 2018-06-06 DIAGNOSIS — R2242 Localized swelling, mass and lump, left lower limb: Secondary | ICD-10-CM | POA: Diagnosis not present

## 2018-06-09 NOTE — Telephone Encounter (Signed)
Error

## 2018-06-13 DIAGNOSIS — I779 Disorder of arteries and arterioles, unspecified: Secondary | ICD-10-CM | POA: Insufficient documentation

## 2018-06-13 DIAGNOSIS — R2242 Localized swelling, mass and lump, left lower limb: Secondary | ICD-10-CM | POA: Diagnosis not present

## 2018-06-13 DIAGNOSIS — C4922 Malignant neoplasm of connective and soft tissue of left lower limb, including hip: Secondary | ICD-10-CM | POA: Insufficient documentation

## 2018-06-16 ENCOUNTER — Telehealth (INDEPENDENT_AMBULATORY_CARE_PROVIDER_SITE_OTHER): Payer: Self-pay

## 2018-06-16 DIAGNOSIS — K5190 Ulcerative colitis, unspecified, without complications: Secondary | ICD-10-CM | POA: Diagnosis not present

## 2018-06-16 DIAGNOSIS — R2242 Localized swelling, mass and lump, left lower limb: Secondary | ICD-10-CM | POA: Diagnosis not present

## 2018-06-16 DIAGNOSIS — Z955 Presence of coronary angioplasty implant and graft: Secondary | ICD-10-CM | POA: Diagnosis not present

## 2018-06-16 DIAGNOSIS — K219 Gastro-esophageal reflux disease without esophagitis: Secondary | ICD-10-CM | POA: Diagnosis not present

## 2018-06-16 DIAGNOSIS — I10 Essential (primary) hypertension: Secondary | ICD-10-CM | POA: Diagnosis not present

## 2018-06-16 DIAGNOSIS — Z7902 Long term (current) use of antithrombotics/antiplatelets: Secondary | ICD-10-CM | POA: Diagnosis not present

## 2018-06-16 DIAGNOSIS — E0590 Thyrotoxicosis, unspecified without thyrotoxic crisis or storm: Secondary | ICD-10-CM | POA: Diagnosis not present

## 2018-06-16 DIAGNOSIS — Z87891 Personal history of nicotine dependence: Secondary | ICD-10-CM | POA: Diagnosis not present

## 2018-06-16 DIAGNOSIS — Z9013 Acquired absence of bilateral breasts and nipples: Secondary | ICD-10-CM | POA: Diagnosis not present

## 2018-06-16 DIAGNOSIS — I252 Old myocardial infarction: Secondary | ICD-10-CM | POA: Diagnosis not present

## 2018-06-16 DIAGNOSIS — I350 Nonrheumatic aortic (valve) stenosis: Secondary | ICD-10-CM | POA: Diagnosis not present

## 2018-06-16 DIAGNOSIS — E785 Hyperlipidemia, unspecified: Secondary | ICD-10-CM | POA: Diagnosis not present

## 2018-06-16 DIAGNOSIS — J309 Allergic rhinitis, unspecified: Secondary | ICD-10-CM | POA: Diagnosis not present

## 2018-06-16 DIAGNOSIS — C4922 Malignant neoplasm of connective and soft tissue of left lower limb, including hip: Secondary | ICD-10-CM | POA: Diagnosis not present

## 2018-06-16 DIAGNOSIS — Z79899 Other long term (current) drug therapy: Secondary | ICD-10-CM | POA: Diagnosis not present

## 2018-06-16 DIAGNOSIS — Z8673 Personal history of transient ischemic attack (TIA), and cerebral infarction without residual deficits: Secondary | ICD-10-CM | POA: Diagnosis not present

## 2018-06-16 DIAGNOSIS — D649 Anemia, unspecified: Secondary | ICD-10-CM | POA: Diagnosis not present

## 2018-06-16 DIAGNOSIS — I2510 Atherosclerotic heart disease of native coronary artery without angina pectoris: Secondary | ICD-10-CM | POA: Diagnosis not present

## 2018-06-16 DIAGNOSIS — E1151 Type 2 diabetes mellitus with diabetic peripheral angiopathy without gangrene: Secondary | ICD-10-CM | POA: Diagnosis not present

## 2018-06-16 NOTE — Telephone Encounter (Signed)
Spoke with pt and informed her of Fallon's message. Pt still wanted appt canceled on Monday. She is aware that she is already scheduled for May and would like appt information mailed to her.  Hey, can someone cancel her appt for Monday and if possible mail pt her May appt information, thanks

## 2018-06-16 NOTE — Telephone Encounter (Signed)
That's fine.  We will follow up at her appointment in May.  She can call us if something arises and she needs to be seen sooner.

## 2018-06-16 NOTE — Telephone Encounter (Signed)
Patient called and left a message on the triage line and stated that she does not see a point in coming to her appointment Monday the 20th. She said she is being [facility] and [facility]. She had a biopsy this morning. If she really needs to come she will but for now she is canceling the appointment.

## 2018-06-19 ENCOUNTER — Ambulatory Visit (INDEPENDENT_AMBULATORY_CARE_PROVIDER_SITE_OTHER): Payer: PPO | Admitting: Vascular Surgery

## 2018-06-20 DIAGNOSIS — C4922 Malignant neoplasm of connective and soft tissue of left lower limb, including hip: Secondary | ICD-10-CM | POA: Diagnosis not present

## 2018-06-26 ENCOUNTER — Other Ambulatory Visit: Payer: Self-pay | Admitting: *Deleted

## 2018-06-26 DIAGNOSIS — C499 Malignant neoplasm of connective and soft tissue, unspecified: Secondary | ICD-10-CM

## 2018-06-28 ENCOUNTER — Encounter: Payer: Self-pay | Admitting: Family Medicine

## 2018-06-28 ENCOUNTER — Ambulatory Visit (INDEPENDENT_AMBULATORY_CARE_PROVIDER_SITE_OTHER): Payer: PPO | Admitting: Family Medicine

## 2018-06-28 VITALS — BP 90/60 | HR 91 | Temp 98.2°F | Resp 16 | Wt 136.6 lb

## 2018-06-28 DIAGNOSIS — R81 Glycosuria: Secondary | ICD-10-CM

## 2018-06-28 DIAGNOSIS — N3091 Cystitis, unspecified with hematuria: Secondary | ICD-10-CM

## 2018-06-28 LAB — POCT URINALYSIS DIPSTICK
Bilirubin, UA: NEGATIVE
Glucose, UA: POSITIVE — AB
Ketones, UA: NEGATIVE
Nitrite, UA: POSITIVE
PROTEIN UA: POSITIVE — AB
Spec Grav, UA: 1.02 (ref 1.010–1.025)
Urobilinogen, UA: NEGATIVE E.U./dL — AB
pH, UA: 7.5 (ref 5.0–8.0)

## 2018-06-28 LAB — GLUCOSE, POCT (MANUAL RESULT ENTRY): POC Glucose: 125 mg/dl — AB (ref 70–99)

## 2018-06-28 MED ORDER — SULFAMETHOXAZOLE-TRIMETHOPRIM 800-160 MG PO TABS: 1.0000 | ORAL_TABLET | Freq: Two times a day (BID) | ORAL | 0 refills | Status: AC

## 2018-06-28 NOTE — Progress Notes (Signed)
Patient: Misty Page Female    DOB: 1941-01-02   78 y.o.   MRN: [id number] Visit Date: 06/28/2018  Today's Provider: Lavon Paganini, MD   Chief Complaint  Patient presents with  . Dysuria   Subjective:     Urinary Tract Infection: Patient complains of suprapubic pressure She has had symptoms for 5 days. Also with urinary frequency.  Patient also complains of back pain. Patient denies fever and vaginal discharge. Patient does have a history of recurrent UTI.  Patient does not have a history of pyelonephritis.   She is not currently fasting.  She is currently undergoing cancer treatment for a sarcoma in [place].   Allergies  Allergen Reactions  . Amoxicillin Anaphylaxis  . Naproxen Hives, Shortness Of Breath, Nausea And Vomiting and Swelling  . Penicillins Shortness Of Breath, Swelling and Rash    Has patient had a PCN reaction causing immediate rash, facial/tongue/throat swelling, SOB or lightheadedness with hypotension: Yes Has patient had a PCN reaction causing severe rash involving mucus membranes or skin necrosis: Yes Has patient had a PCN reaction that required hospitalization: Yes Has patient had a PCN reaction occurring within the last 10 years: No  If all of the above answers are "NO", then may proceed with Cephalosporin use.   . Codeine Nausea And Vomiting  . Levofloxacin Nausea And Vomiting  . Shellfish Allergy Hives, Diarrhea, Nausea And Vomiting and Swelling     Current Outpatient Medications:  .  Ascorbic Acid (VITAMIN C) 1000 MG tablet, Take 500 mg by mouth daily. , Disp: , Rfl:  .  B COMPLEX VITAMINS PO, Take 1 tablet by mouth daily. B COMPLEX (Oral Capsule)  1 Every Day for 0 days  Quantity: 0.00;  Refills: 0   Ordered :13-Apr-2010  Celene Kras, MA, Anastasiya ;  Started 29-October-2008 Active Comments: DX: 414.00, Disp: , Rfl:  .  Calcium-Vitamin D-Vitamin K 500-100-40 MG-UNT-MCG CHEW, Chew 1 tablet by mouth 2 (two) times daily. VIACTIV, 500-100-40 (Oral  Tablet Chewable)  1 tablet twice a day for 0 days  Quantity: 0.00;  Refills: 0   Ordered :13-Apr-2010  Celene Kras, MA, Anastasiya ;  Started 29-October-2008 Active Comments: DX: 733.00, Disp: , Rfl:  .  clopidogrel (PLAVIX) 75 MG tablet, Take 1 tablet (75 mg total) by mouth daily., Disp: 30 tablet, Rfl: 5 .  Coenzyme Q10 (COQ10) 200 MG CAPS, Take 1 capsule by mouth at bedtime., Disp: , Rfl:  .  Cranberry 300 MG tablet, Take 300 mg by mouth daily. , Disp: , Rfl:  .  doxycycline (VIBRAMYCIN) 100 MG capsule, , Disp: , Rfl:  .  famotidine (PEPCID) 40 MG tablet, TAKE ONE (1) TABLET BY MOUTH EVERY DAY, Disp: 90 tablet, Rfl: 1 .  ferrous sulfate 325 (65 FE) MG EC tablet, Take 325 mg by mouth daily. FERROUS SULFATE, 325 (65 Fe)MG (Oral Tablet Delayed Release)  1 every day for 0 days  Quantity: 0.00;  Refills: 0   Ordered :13-Apr-2010  Celene Kras, MA, Anastasiya ;  Started 29-October-2008 Active Comments: DX: 285.9, Disp: , Rfl:  .  fexofenadine (ALLEGRA) 180 MG tablet, Take 180 mg by mouth at bedtime. , Disp: , Rfl:  .  fluticasone (FLONASE) 50 MCG/ACT nasal spray, USE 2 SPRAYS EACH NOSTRIL DAILY, Disp: 48 g, Rfl: 1 .  hydrochlorothiazide (HYDRODIURIL) 25 MG tablet, TAKE ONE (1) TABLET EACH DAY, Disp: 90 tablet, Rfl: 1 .  hyoscyamine (LEVSIN, ANASPAZ) 0.125 MG tablet, Take by mouth., Disp: , Rfl:  .  losartan (COZAAR) 100 MG tablet, TAKE ONE (1) TABLET EACH DAY, Disp: 90 tablet, Rfl: 2 .  Mesalamine (ASACOL HD) 800 MG TBEC, TAKE 2 TABLETS BY MOUTH TWICE A DAY., Disp: , Rfl:  .  methimazole (TAPAZOLE) 5 MG tablet, Take 2.5 mg by mouth. , Disp: , Rfl:  .  montelukast (SINGULAIR) 10 MG tablet, Take 1 tablet (10 mg total) by mouth at bedtime., Disp: 90 tablet, Rfl: 1 .  Multiple Vitamins-Minerals (CENTRUM SILVER PO), Take 1 capsule by mouth daily., Disp: , Rfl:  .  rosuvastatin (CRESTOR) 20 MG tablet, Take 0.5 tablets (10 mg total) by mouth daily., Disp: 45 tablet, Rfl: 2 .  triamcinolone cream (KENALOG) 0.1 %, ,  Disp: , Rfl:  .  sulfamethoxazole-trimethoprim (BACTRIM DS,SEPTRA DS) 800-160 MG tablet, Take 1 tablet by mouth 2 (two) times daily for 7 days., Disp: 14 tablet, Rfl: 0  Review of Systems  Constitutional: Negative.   Respiratory: Negative.   Cardiovascular: Negative.   Gastrointestinal: Positive for abdominal pain and constipation.  Genitourinary: Positive for dysuria and flank pain.    Social History   Tobacco Use  . Smoking status: Former Smoker    Last attempt to quit: 05/30/1994    Years since quitting: 24.0  . Smokeless tobacco: Never Used  Substance Use Topics  . Alcohol use: Yes    Comment: rare      Objective:   BP 90/60 (BP Location: Left Arm, Patient Position: Sitting, Cuff Size: Normal)   Pulse 91   Temp 98.2 F (36.8 C) (Oral)   Resp 16   Wt 136 lb 9.6 oz (62 kg)   BMI 25.81 kg/m  Vitals:   06/28/18 1028  BP: 90/60  Pulse: 91  Resp: 16  Temp: 98.2 F (36.8 C)  TempSrc: Oral  Weight: 136 lb 9.6 oz (62 kg)     Physical Exam Vitals signs reviewed.  Constitutional:      General: She is not in acute distress.    Appearance: Normal appearance. She is not diaphoretic.  HENT:     Head: Normocephalic and atraumatic.  Eyes:     General: No scleral icterus.    Conjunctiva/sclera: Conjunctivae normal.  Neck:     Musculoskeletal: Neck supple.  Cardiovascular:     Rate and Rhythm: Normal rate and regular rhythm.     Pulses: Normal pulses.     Heart sounds: Murmur present.  Pulmonary:     Effort: Pulmonary effort is normal. No respiratory distress.     Breath sounds: Normal breath sounds. No wheezing or rhonchi.  Abdominal:     General: There is no distension.     Palpations: Abdomen is soft.     Tenderness: There is abdominal tenderness in the suprapubic area and left lower quadrant. There is no right CVA tenderness, left CVA tenderness, guarding or rebound.  Musculoskeletal:     Right lower leg: No edema.     Left lower leg: No edema.    Lymphadenopathy:     Cervical: No cervical adenopathy.  Skin:    Capillary Refill: Capillary refill takes less than 2 seconds.     Findings: No rash.  Neurological:     Mental Status: She is alert and oriented to person, place, and time. Mental status is at baseline.  Psychiatric:        Mood and Affect: Mood normal.        Behavior: Behavior normal.      Results for orders placed or performed in  visit on 06/28/18  POCT Urinalysis Dipstick  Result Value Ref Range   Color, UA yellow    Clarity, UA clear    Glucose, UA Positive (A) Negative   Bilirubin, UA negative    Ketones, UA negative    Spec Grav, UA 1.020 1.010 - 1.025   Blood, UA Large    pH, UA 7.5 5.0 - 8.0   Protein, UA Positive (A) Negative   Urobilinogen, UA negative (A) 0.2 or 1.0 E.U./dL   Nitrite, UA positive    Leukocytes, UA Large (3+) (A) Negative  POCT glucose (manual entry)  Result Value Ref Range   POC Glucose 125 (A) 70 - 99 mg/dl       Assessment & Plan   1. Cystitis with hematuria - Symptoms and UA consistent with UTI -No systemic symptoms or signs of pyelonephritis -Given hematuria, will send urine micro to confirm and will plan to recheck urine in about 6 weeks after completion of antibiotics to ensure hematuria has cleared -Will start treatment with 7day course of Bactrim  -We will send urine culture to confirm sensitivities -Discussed return precautions - POCT Urinalysis Dipstick - Urine Microscopic - Urine Culture  2. Glucosuria - noted on UA - of note UA dipstick did sit out for a while as we had a fire alarm - CBG is reasonable considering she is non-fasting - will recheck at next visit along with A1c    Meds ordered this encounter  Medications  . sulfamethoxazole-trimethoprim (BACTRIM DS,SEPTRA DS) 800-160 MG tablet    Sig: Take 1 tablet by mouth 2 (two) times daily for 7 days.    Dispense:  14 tablet    Refill:  0     Return if symptoms worsen or fail to improve.   The  entirety of the information documented in the History of Present Illness, Review of Systems and Physical Exam were personally obtained by me. Portions of this information were initially documented by Lynford Humphrey and Tiburcio Pea, [place] and reviewed by me for thoroughness and accuracy.    Virginia Crews, MD, MPH [facility] 06/28/2018 11:13 AM

## 2018-06-28 NOTE — Patient Instructions (Signed)
Urinary Tract Infection, Adult A urinary tract infection (UTI) is an infection of any part of the urinary tract. The urinary tract includes:  The kidneys.  The ureters.  The bladder.  The urethra. These organs make, store, and get rid of pee (urine) in the body. What are the causes? This is caused by germs (bacteria) in your genital area. These germs grow and cause swelling (inflammation) of your urinary tract. What increases the risk? You are more likely to develop this condition if:  You have a small, thin tube (catheter) to drain pee.  You cannot control when you pee or poop (incontinence).  You are female, and: ? You use these methods to prevent pregnancy: ? A medicine that kills sperm (spermicide). ? A device that blocks sperm (diaphragm). ? You have low levels of a female hormone (estrogen). ? You are pregnant.  You have genes that add to your risk.  You are sexually active.  You take antibiotic medicines.  You have trouble peeing because of: ? A prostate that is bigger than normal, if you are female. ? A blockage in the part of your body that drains pee from the bladder (urethra). ? A kidney stone. ? A nerve condition that affects your bladder (neurogenic bladder). ? Not getting enough to drink. ? Not peeing often enough.  You have other conditions, such as: ? Diabetes. ? A weak disease-fighting system (immune system). ? Sickle cell disease. ? Gout. ? Injury of the spine. What are the signs or symptoms? Symptoms of this condition include:  Needing to pee right away (urgently).  Peeing often.  Peeing small amounts often.  Pain or burning when peeing.  Blood in the pee.  Pee that smells bad or not like normal.  Trouble peeing.  Pee that is cloudy.  Fluid coming from the vagina, if you are female.  Pain in the belly or lower back. Other symptoms include:  Throwing up (vomiting).  No urge to eat.  Feeling mixed up (confused).  Being tired  and grouchy (irritable).  A fever.  Watery poop (diarrhea). How is this treated? This condition may be treated with:  Antibiotic medicine.  Other medicines.  Drinking enough water. Follow these instructions at home:  Medicines  Take over-the-counter and prescription medicines only as told by your doctor.  If you were prescribed an antibiotic medicine, take it as told by your doctor. Do not stop taking it even if you start to feel better. General instructions  Make sure you: ? Pee until your bladder is empty. ? Do not hold pee for a long time. ? Empty your bladder after sex. ? Wipe from front to back after pooping if you are a female. Use each tissue one time when you wipe.  Drink enough fluid to keep your pee pale yellow.  Keep all follow-up visits as told by your doctor. This is important. Contact a doctor if:  You do not get better after 1-2 days.  Your symptoms go away and then come back. Get help right away if:  You have very bad back pain.  You have very bad pain in your lower belly.  You have a fever.  You are sick to your stomach (nauseous).  You are throwing up. Summary  A urinary tract infection (UTI) is an infection of any part of the urinary tract.  This condition is caused by germs in your genital area.  There are many risk factors for a UTI. These include having a small, thin   tube to drain pee and not being able to control when you pee or poop.  Treatment includes antibiotic medicines for germs.  Drink enough fluid to keep your pee pale yellow. This information is not intended to replace advice given to you by your health care provider. Make sure you discuss any questions you have with your health care provider. Document Released: 11/03/2007 Document Revised: 11/24/2017 Document Reviewed: 11/24/2017 Elsevier Interactive Patient Education  2019 Elsevier Inc.  

## 2018-06-29 ENCOUNTER — Ambulatory Visit
Admission: RE | Admit: 2018-06-29 | Discharge: 2018-06-29 | Disposition: A | Discharge status: Home / Self Care | Payer: PPO | Source: Ambulatory Visit | Attending: Radiation Oncology | Admitting: Radiation Oncology

## 2018-06-29 ENCOUNTER — Inpatient Hospital Stay: Payer: PPO | Attending: Oncology | Admitting: Oncology

## 2018-06-29 ENCOUNTER — Other Ambulatory Visit: Payer: Self-pay

## 2018-06-29 VITALS — BP 115/67 | HR 83 | Temp 97.1°F | Ht 61.0 in | Wt 137.0 lb

## 2018-06-29 DIAGNOSIS — Z8042 Family history of malignant neoplasm of prostate: Secondary | ICD-10-CM | POA: Insufficient documentation

## 2018-06-29 DIAGNOSIS — Z90710 Acquired absence of both cervix and uterus: Secondary | ICD-10-CM

## 2018-06-29 DIAGNOSIS — Z87891 Personal history of nicotine dependence: Secondary | ICD-10-CM | POA: Diagnosis not present

## 2018-06-29 DIAGNOSIS — Z79899 Other long term (current) drug therapy: Secondary | ICD-10-CM | POA: Diagnosis not present

## 2018-06-29 DIAGNOSIS — Z800 Family history of malignant neoplasm of digestive organs: Secondary | ICD-10-CM | POA: Insufficient documentation

## 2018-06-29 DIAGNOSIS — I10 Essential (primary) hypertension: Secondary | ICD-10-CM | POA: Diagnosis not present

## 2018-06-29 DIAGNOSIS — C499 Malignant neoplasm of connective and soft tissue, unspecified: Secondary | ICD-10-CM

## 2018-06-29 DIAGNOSIS — C4922 Malignant neoplasm of connective and soft tissue of left lower limb, including hip: Secondary | ICD-10-CM | POA: Insufficient documentation

## 2018-06-29 LAB — URINALYSIS, MICROSCOPIC ONLY
Casts: NONE SEEN /lpf
RBC, UA: >30 /hpf — AB (ref 0–2)
WBC, UA: >30 /hpf — AB (ref 0–5)

## 2018-06-29 NOTE — Consult Note (Signed)
NEW PATIENT EVALUATION  Name: Misty Page  MRN: [id number]  Date:   06/29/2018     DOB: 1941/05/16   This 78 y.o. female patient presents to the clinic for initial evaluation of high-grade sarcoma of the left thigh for preoperative ration therapy.  REFERRING PHYSICIAN: Virginia Crews, MD  CHIEF COMPLAINT:  Chief Complaint  Patient presents with  . Cancer    Inital consultaion sarcoma    DIAGNOSIS: The encounter diagnosis was Sarcoma ([place]).   PREVIOUS INVESTIGATIONS:  Pathology reports reviewed MRI scans reviewed Clinical notes reviewed  HPI: patient is a 78 year old female who noticed a mass in her left thigh progressing. Was not causing problems with ambulation or pain. She was seen at [facility] for MRI scan showed a large heterogeneous T2 hyperintense lesion in the anterior compartment of the proximal left thigh suspicious for soft tissue sarcoma.PET CT scanning confirmed a large hypervascular necrotic appearing left thigh mass hypermetabolic on PET/CT consistent with a neoplastic process.Biopsy was positive for spindle epithelioid cell lesion favoring high-grade sarcoma.biopsy showed again atypical spindle cell lesion favoring an differentiated pleomorphic sarcoma.patient's been seen by orthopedic surgery scheduled for surgical resection and they have requested preoperative radiation therapy. She seen today for evaluation.She is doing fairly well no significant pain or difficulty ambulating.  PLANNED TREATMENT REGIMEN: preoperative radiation therapy  PAST MEDICAL HISTORY:  has a past medical history of Allergy, Anemia, Arthritis, Colitis, Complication of anesthesia, Environmental and seasonal allergies, Fibrocystic breast disease (FCBD), GERD (gastroesophageal reflux disease), High cholesterol, History of heart artery stent, Hyperlipidemia, Hypertension, Myocardial infarction ([place]), Osteoporosis, Thyroid disease, and TIA (transient ischemic attack).    PAST SURGICAL  HISTORY:  Past Surgical History:  Procedure Laterality Date  . ABDOMINAL HYSTERECTOMY  2007  . BREAST ENHANCEMENT SURGERY  2004  . BREAST IMPLANT REMOVAL    . BREAST SURGERY  1984   Fibrocystic Disease  . CAROTID ENDARTERECTOMY Left 06/21/2014   Dr. Delana Meyer  . CAROTID ENDARTERECTOMY Right 08/02/2014   Dr. Delana Meyer  . CAROTID PTA/STENT INTERVENTION N/A 06/15/2017   Procedure: CAROTID PTA/STENT INTERVENTION;  Surgeon: Katha Cabal, MD;  Location: [facility];  Service: Cardiovascular;  Laterality: N/A;  . COLONOSCOPY WITH PROPOFOL N/A 07/14/2015   Procedure: COLONOSCOPY WITH PROPOFOL;  Surgeon: Hulen Luster, MD;  Location: [facility] ENDOSCOPY;  Service: Gastroenterology;  Laterality: N/A;  . COLONOSCOPY WITH PROPOFOL N/A 02/08/2018   Procedure: COLONOSCOPY WITH PROPOFOL;  Surgeon: Toledo, Benay Pike, MD;  Location: [facility] ENDOSCOPY;  Service: Gastroenterology;  Laterality: N/A;  . CORONARY ANGIOPLASTY WITH STENT PLACEMENT  1999  . ESOPHAGOGASTRODUODENOSCOPY (EGD) WITH PROPOFOL N/A 02/08/2018   Procedure: ESOPHAGOGASTRODUODENOSCOPY (EGD) WITH PROPOFOL;  Surgeon: Toledo, Benay Pike, MD;  Location: [facility] ENDOSCOPY;  Service: Gastroenterology;  Laterality: N/A;  . FEMORAL ARTERY STENT  08/2003  . HERNIA REPAIR  2008  . RENAL ARTERY STENT  08/2003  . TRANSUMBILICAL AUGMENTATION MAMMAPLASTY      FAMILY HISTORY: family history includes Alzheimer's disease in her sister; Arthritis in her sister; Breast cancer in her paternal grandmother; COPD in her mother; Congestive Heart Failure in her mother; Emphysema in her brother; Heart attack in her brother and maternal uncle; Heart disease in her mother; Hyperlipidemia in her mother; Hypertension in her mother and sister; Kidney failure in her brother; Leukemia in her brother and paternal grandfather; Osteoporosis in her mother; Pancreatic cancer in her brother and father; Prostate cancer in her brother; Stomach cancer in her brother and brother; Stroke in  her maternal  aunt.  SOCIAL HISTORY:  reports that she quit smoking about 24 years ago. She has never used smokeless tobacco. She reports current alcohol use. She reports that she does not use drugs.  ALLERGIES: Amoxicillin; Naproxen; Penicillins; Codeine; Levofloxacin; and Shellfish allergy  MEDICATIONS:  Current Outpatient Medications  Medication Sig Dispense Refill  . Ascorbic Acid (VITAMIN C) 1000 MG tablet Take 500 mg by mouth daily.     . B COMPLEX VITAMINS PO Take 1 tablet by mouth daily. B COMPLEX (Oral Capsule)  1 Every Day for 0 days  Quantity: 0.00;  Refills: 0   Ordered :13-Apr-2010  Celene Kras, MA, Anastasiya ;  Started 29-October-2008 Active Comments: DX: 414.00    . Calcium-Vitamin D-Vitamin K 500-100-40 MG-UNT-MCG CHEW Chew 1 tablet by mouth 2 (two) times daily. VIACTIV, 500-100-40 (Oral Tablet Chewable)  1 tablet twice a day for 0 days  Quantity: 0.00;  Refills: 0   Ordered :13-Apr-2010  Celene Kras, MA, Anastasiya ;  Started 29-October-2008 Active Comments: DX: 733.00    . clopidogrel (PLAVIX) 75 MG tablet Take 1 tablet (75 mg total) by mouth daily. 30 tablet 5  . Coenzyme Q10 (COQ10) 200 MG CAPS Take 1 capsule by mouth at bedtime.    . Cranberry 300 MG tablet Take 300 mg by mouth daily.     Marland Kitchen doxycycline (VIBRAMYCIN) 100 MG capsule     . famotidine (PEPCID) 40 MG tablet TAKE ONE (1) TABLET BY MOUTH EVERY DAY 90 tablet 1  . ferrous sulfate 325 (65 FE) MG EC tablet Take 325 mg by mouth daily. FERROUS SULFATE, 325 (65 Fe)MG (Oral Tablet Delayed Release)  1 every day for 0 days  Quantity: 0.00;  Refills: 0   Ordered :13-Apr-2010  Celene Kras, MA, Anastasiya ;  Started 29-October-2008 Active Comments: DX: 285.9    . fexofenadine (ALLEGRA) 180 MG tablet Take 180 mg by mouth at bedtime.     . fluticasone (FLONASE) 50 MCG/ACT nasal spray USE 2 SPRAYS EACH NOSTRIL DAILY 48 g 1  . hydrochlorothiazide (HYDRODIURIL) 25 MG tablet TAKE ONE (1) TABLET EACH DAY 90 tablet 1  . hyoscyamine (LEVSIN,  ANASPAZ) 0.125 MG tablet Take by mouth.    . losartan (COZAAR) 100 MG tablet TAKE ONE (1) TABLET EACH DAY 90 tablet 2  . Mesalamine (ASACOL HD) 800 MG TBEC TAKE 2 TABLETS BY MOUTH TWICE A DAY.    . methimazole (TAPAZOLE) 5 MG tablet Take 2.5 mg by mouth.     . montelukast (SINGULAIR) 10 MG tablet Take 1 tablet (10 mg total) by mouth at bedtime. 90 tablet 1  . Multiple Vitamins-Minerals (CENTRUM SILVER PO) Take 1 capsule by mouth daily.    . rosuvastatin (CRESTOR) 20 MG tablet Take 0.5 tablets (10 mg total) by mouth daily. 45 tablet 2  . sulfamethoxazole-trimethoprim (BACTRIM DS,SEPTRA DS) 800-160 MG tablet Take 1 tablet by mouth 2 (two) times daily for 7 days. 14 tablet 0  . triamcinolone cream (KENALOG) 0.1 %      No current facility-administered medications for this encounter.     ECOG PERFORMANCE STATUS:  0 - Asymptomatic  REVIEW OF SYSTEMS:  Patient denies any weight loss, fatigue, weakness, fever, chills or night sweats. Patient denies any loss of vision, blurred vision. Patient denies any ringing  of the ears or hearing loss. No irregular heartbeat. Patient denies heart murmur or history of fainting. Patient denies any chest pain or pain radiating to her upper extremities. Patient denies any shortness of breath, difficulty breathing at night, cough  or hemoptysis. Patient denies any swelling in the lower legs. Patient denies any nausea vomiting, vomiting of blood, or coffee ground material in the vomitus. Patient denies any stomach pain. Patient states has had normal bowel movements no significant constipation or diarrhea. Patient denies any dysuria, hematuria or significant nocturia. Patient denies any problems walking, swelling in the joints or loss of balance. Patient denies any skin changes, loss of hair or loss of weight. Patient denies any excessive worrying or anxiety or significant depression. Patient denies any problems with insomnia. Patient denies excessive thirst, polyuria,  polydipsia. Patient denies any swollen glands, patient denies easy bruising or easy bleeding. Patient denies any recent infections, allergies or URI. Patient "s visual fields have not changed significantly in recent time.    PHYSICAL EXAM: There were no vitals taken for this visit. arge firm mass in the left thigh with no involvement of skin. Proprioception is intact range of motion of her lower extremity does not elicit pain. No significant edema in her lower extremities is noted.Well-developed well-nourished patient in NAD. HEENT reveals PERLA, EOMI, discs not visualized.  Oral cavity is clear. No oral mucosal lesions are identified. Neck is clear without evidence of cervical or supraclavicular adenopathy. Lungs are clear to A&P. Cardiac examination is essentially unremarkable with regular rate and rhythm without murmur rub or thrill. Abdomen is benign with no organomegaly or masses noted. Motor sensory and DTR levels are equal and symmetric in the upper and lower extremities. Cranial nerves II through XII are grossly intact. Proprioception is intact. No peripheral adenopathy or edema is identified. No motor or sensory levels are noted. Crude visual fields are within normal range.  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  PLAN: at this time I have recommended preoperative radiation therapy. Would plan on delivering 5000 cGy over 5 weeks. Risks and benefits of treatment including skin reaction possible development of edema in her lower extremities fatigue alteration of blood counts all were described in detail to the patient. I have personally set up and ordered CT simulation. Patient after surgery depending on pathology results may need adjustment radiation therapy and  possibly chemotherapy will make the decisions when pathology report can be evaluated. Patient comprehend my treatment plan well.  I would like to take this opportunity to thank you for allowing me to participate in the care of your patient.Noreene Filbert, MD

## 2018-06-29 NOTE — Progress Notes (Signed)
Patient is here today to follow up on her sarcoma. Patient was able to go to [facility]. Misty Page and Dr. Redmond Pulling for her second opinion and she had a biopsy done. Later on today she will be seeing Dr. Baruch Gouty to discuss radiation. Patient stated that after radiation she will have a second MRI and then surgery.

## 2018-06-30 ENCOUNTER — Telehealth: Payer: Self-pay

## 2018-06-30 LAB — URINE CULTURE

## 2018-06-30 NOTE — Progress Notes (Signed)
Misty Page  [phone]) [phone] [phone]) [phone]  ID: [name] OB: 1940-09-28  MR#: [id number]  OBS#:[id number]  Patient Care Team: Virginia Crews, MD as PCP - General (Family Medicine) Schnier, Dolores Lory, MD as Consulting Physician (Vascular Surgery) Corey Skains, MD as Consulting Physician (Cardiology) Lonia Farber, MD as Consulting Physician (Internal Medicine) Poggi, Marshall Cork, MD as Consulting Physician (Surgery) Corey Skains, MD as Consulting Physician (Cardiology) Bertram Gala as Physician Assistant (Gastroenterology) Rubye Beach as Physician Assistant (Family Medicine)  CHIEF COMPLAINT: 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  INTERVAL HISTORY: Patient returns to clinic today for further evaluation and treatment planning.  She was evaluated at [facility] and recommendation was to proceed with neoadjuvant XRT followed by surgical resection.  Patient currently feels well and is asymptomatic.  She does not complain of pain today.  She has no neurologic complaints.  She denies any recent fevers or illnesses.  She has a good appetite and denies weight loss.  She has no chest pain or shortness of breath.  She denies any nausea, vomiting, constipation, or diarrhea.  She has no melena or hematochezia.  She has no urinary complaints.  Patient offers no specific complaints today.  REVIEW OF SYSTEMS:   Review of Systems  Constitutional: Negative.  Negative for fever, malaise/fatigue and weight loss.  Respiratory: Negative.  Negative for cough, hemoptysis and shortness of breath.   Cardiovascular: Positive for leg swelling. Negative for chest pain.  Gastrointestinal: Negative.  Negative for abdominal pain, blood in stool and melena.  Genitourinary: Negative.  Negative for dysuria and flank pain.  Musculoskeletal: Negative.  Negative for back pain.  Skin: Negative.  Negative for rash.  Neurological: Negative.   Negative for focal weakness, weakness and headaches.  Psychiatric/Behavioral: Negative.  The patient is not nervous/anxious.     As per HPI. Otherwise, a complete review of systems is negative.  PAST MEDICAL HISTORY: Past Medical History:  Diagnosis Date  . Allergy   . Anemia   . Arthritis   . Colitis   . Complication of anesthesia    nausea  . Environmental and seasonal allergies   . Fibrocystic breast disease (FCBD)   . GERD (gastroesophageal reflux disease)   . High cholesterol   . History of heart artery stent   . Hyperlipidemia   . Hypertension   . Myocardial infarction ([place])   . Osteoporosis   . Thyroid disease   . TIA (transient ischemic attack)     PAST SURGICAL HISTORY: Past Surgical History:  Procedure Laterality Date  . ABDOMINAL HYSTERECTOMY  2007  . BREAST ENHANCEMENT SURGERY  2004  . BREAST IMPLANT REMOVAL    . BREAST SURGERY  1984   Fibrocystic Disease  . CAROTID ENDARTERECTOMY Left 06/21/2014   Dr. Delana Meyer  . CAROTID ENDARTERECTOMY Right 08/02/2014   Dr. Delana Meyer  . CAROTID PTA/STENT INTERVENTION N/A 06/15/2017   Procedure: CAROTID PTA/STENT INTERVENTION;  Surgeon: Katha Cabal, MD;  Location: [place] [facility];  Service: Cardiovascular;  Laterality: N/A;  . COLONOSCOPY WITH PROPOFOL N/A 07/14/2015   Procedure: COLONOSCOPY WITH PROPOFOL;  Surgeon: Hulen Luster, MD;  Location: [facility] ENDOSCOPY;  Service: Gastroenterology;  Laterality: N/A;  . COLONOSCOPY WITH PROPOFOL N/A 02/08/2018   Procedure: COLONOSCOPY WITH PROPOFOL;  Surgeon: Toledo, Benay Pike, MD;  Location: [facility] ENDOSCOPY;  Service: Gastroenterology;  Laterality: N/A;  . CORONARY ANGIOPLASTY WITH STENT PLACEMENT  1999  . ESOPHAGOGASTRODUODENOSCOPY (EGD) WITH PROPOFOL N/A 02/08/2018  Procedure: ESOPHAGOGASTRODUODENOSCOPY (EGD) WITH PROPOFOL;  Surgeon: Toledo, Benay Pike, MD;  Location: [facility] ENDOSCOPY;  Service: Gastroenterology;  Laterality: N/A;  . FEMORAL ARTERY STENT  08/2003  . HERNIA REPAIR   2008  . RENAL ARTERY STENT  08/2003  . TRANSUMBILICAL AUGMENTATION MAMMAPLASTY      FAMILY HISTORY: Family History  Problem Relation Age of Onset  . Hyperlipidemia Mother   . Congestive Heart Failure Mother   . COPD Mother   . Heart disease Mother   . Hypertension Mother   . Osteoporosis Mother   . Pancreatic cancer Father   . Arthritis Sister   . Alzheimer's disease Sister   . Hypertension Sister   . Prostate cancer Brother   . Heart attack Brother   . Stomach cancer Brother   . Kidney failure Brother   . Leukemia Brother   . Emphysema Brother   . Pancreatic cancer Brother   . Stomach cancer Brother   . Breast cancer Paternal Grandmother   . Leukemia Paternal Grandfather   . Heart attack Maternal Uncle   . Stroke Maternal Aunt     ADVANCED DIRECTIVES (Y/N):  N  HEALTH MAINTENANCE: Social History   Tobacco Use  . Smoking status: Former Smoker    Last attempt to quit: 05/30/1994    Years since quitting: 24.1  . Smokeless tobacco: Never Used  Substance Use Topics  . Alcohol use: Yes    Comment: rare  . Drug use: No     Colonoscopy:  PAP:  Bone density:  Lipid panel:  Allergies  Allergen Reactions  . Amoxicillin Anaphylaxis  . Naproxen Hives, Shortness Of Breath, Nausea And Vomiting and Swelling  . Penicillins Shortness Of Breath, Swelling and Rash    Has patient had a PCN reaction causing immediate rash, facial/tongue/throat swelling, SOB or lightheadedness with hypotension: Yes Has patient had a PCN reaction causing severe rash involving mucus membranes or skin necrosis: Yes Has patient had a PCN reaction that required hospitalization: Yes Has patient had a PCN reaction occurring within the last 10 years: No  If all of the above answers are "NO", then may proceed with Cephalosporin use.   . Codeine Nausea And Vomiting  . Levofloxacin Nausea And Vomiting  . Shellfish Allergy Hives, Diarrhea, Nausea And Vomiting and Swelling    Current Outpatient  Medications  Medication Sig Dispense Refill  . Ascorbic Acid (VITAMIN C) 1000 MG tablet Take 500 mg by mouth daily.     . B COMPLEX VITAMINS PO Take 1 tablet by mouth daily. B COMPLEX (Oral Capsule)  1 Every Day for 0 days  Quantity: 0.00;  Refills: 0   Ordered :13-Apr-2010  Celene Kras, MA, Anastasiya ;  Started 29-October-2008 Active Comments: DX: 414.00    . Calcium-Vitamin D-Vitamin K 500-100-40 MG-UNT-MCG CHEW Chew 1 tablet by mouth 2 (two) times daily. VIACTIV, 500-100-40 (Oral Tablet Chewable)  1 tablet twice a day for 0 days  Quantity: 0.00;  Refills: 0   Ordered :13-Apr-2010  Celene Kras, MA, Anastasiya ;  Started 29-October-2008 Active Comments: DX: 733.00    . clopidogrel (PLAVIX) 75 MG tablet Take 1 tablet (75 mg total) by mouth daily. 30 tablet 5  . Coenzyme Q10 (COQ10) 200 MG CAPS Take 1 capsule by mouth at bedtime.    . Cranberry 300 MG tablet Take 300 mg by mouth daily.     Marland Kitchen doxycycline (VIBRAMYCIN) 100 MG capsule     . famotidine (PEPCID) 40 MG tablet TAKE ONE (1) TABLET BY MOUTH EVERY  DAY 90 tablet 1  . ferrous sulfate 325 (65 FE) MG EC tablet Take 325 mg by mouth daily. FERROUS SULFATE, 325 (65 Fe)MG (Oral Tablet Delayed Release)  1 every day for 0 days  Quantity: 0.00;  Refills: 0   Ordered :13-Apr-2010  Celene Kras, MA, Anastasiya ;  Started 29-October-2008 Active Comments: DX: 285.9    . fexofenadine (ALLEGRA) 180 MG tablet Take 180 mg by mouth at bedtime.     . fluticasone (FLONASE) 50 MCG/ACT nasal spray USE 2 SPRAYS EACH NOSTRIL DAILY 48 g 1  . hydrochlorothiazide (HYDRODIURIL) 25 MG tablet TAKE ONE (1) TABLET EACH DAY 90 tablet 1  . hyoscyamine (LEVSIN, ANASPAZ) 0.125 MG tablet Take by mouth.    . losartan (COZAAR) 100 MG tablet TAKE ONE (1) TABLET EACH DAY 90 tablet 2  . Mesalamine (ASACOL HD) 800 MG TBEC TAKE 2 TABLETS BY MOUTH TWICE A DAY.    . methimazole (TAPAZOLE) 5 MG tablet Take 2.5 mg by mouth.     . montelukast (SINGULAIR) 10 MG tablet Take 1 tablet (10 mg total) by  mouth at bedtime. 90 tablet 1  . Multiple Vitamins-Minerals (CENTRUM SILVER PO) Take 1 capsule by mouth daily.    . rosuvastatin (CRESTOR) 20 MG tablet Take 0.5 tablets (10 mg total) by mouth daily. 45 tablet 2  . sulfamethoxazole-trimethoprim (BACTRIM DS,SEPTRA DS) 800-160 MG tablet Take 1 tablet by mouth 2 (two) times daily for 7 days. 14 tablet 0  . triamcinolone cream (KENALOG) 0.1 %      No current facility-administered medications for this visit.     OBJECTIVE: Vitals:   06/29/18 0948  BP: 115/67  Pulse: 83  Temp: (!) 97.1 F (36.2 C)     Body mass index is 25.89 kg/m.    ECOG FS:0 - Asymptomatic  General: Well-developed, well-nourished, no acute distress. Eyes: Pink conjunctiva, anicteric sclera. HEENT: Normocephalic, moist mucous membranes. Lungs: Clear to auscultation bilaterally. Heart: Regular rate and rhythm. No rubs, murmurs, or gallops. Abdomen: Soft, nontender, nondistended. No organomegaly noted, normoactive bowel sounds. Musculoskeletal: Increased swelling and circumference of left thigh. Neuro: Alert, answering all questions appropriately. Cranial nerves grossly intact. Skin: No rashes or petechiae noted. Psych: Normal affect. Lymphatics: No cervical, calvicular, axillary or inguinal LAD.   LAB RESULTS:  Lab Results  Component Value Date   NA 137 06/16/2017   K 3.4 (L) 06/16/2017   CL 104 06/16/2017   CO2 24 06/16/2017   GLUCOSE 145 (H) 06/16/2017   BUN 11 06/16/2017   CREATININE 0.60 05/05/2018   CALCIUM 8.1 (L) 06/16/2017   PROT 6.7 06/28/2016   ALBUMIN 4.4 06/28/2016   AST 17 06/28/2016   ALT 16 06/28/2016   ALKPHOS 65 06/28/2016   BILITOT 0.3 06/28/2016   GFRNONAA >60 06/16/2017   GFRAA >60 06/16/2017    Lab Results  Component Value Date   WBC 10.6 06/16/2017   NEUTROABS 4.1 06/28/2016   HGB 12.1 06/16/2017   HCT 35.7 06/16/2017   MCV 94.4 06/16/2017   PLT 272 06/16/2017     STUDIES: No results found.  ASSESSMENT: High-grade  sarcoma on left thigh.  PLAN:    1. 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  She has no other evidence of active disease.  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 They are negative for cytokeratin AE1/AE3, EMA, S100, desmin, smooth muscle actin, CK, and CD31.  KI-67 is elevated at 20%.)  Recommendation was to proceed with neoadjuvant XRT which patient wishes to receive in [facility] and has consultation with radiation oncology later today.  She will then undergo surgery at [facility].  It is unclear if patient will require adjuvant chemotherapy at this time.  Return to clinic in 3 months or approximately 2 to 3 weeks after her surgery to discuss her final pathology results and any additional treatment planning necessary.    I spent a total of 30 minutes face-to-face with the patient of which greater than 50% of the visit was spent in counseling and coordination of care as detailed above.   Patient expressed understanding and was in agreement with this plan. She also understands that She can call clinic at any time with any questions, concerns, or complaints.   Cancer Staging No matching staging information was found for the patient.  Lloyd Huger, MD   06/30/2018 6:56 AM

## 2018-06-30 NOTE — Telephone Encounter (Signed)
-----   Message from Virginia Crews, MD sent at 06/30/2018  1:04 PM EST ----- Urine culture confirms UTI that is sensitive to abx prescribed.  Hope she is doing better.

## 2018-06-30 NOTE — Telephone Encounter (Signed)
Patient advised.

## 2018-06-30 NOTE — Telephone Encounter (Signed)
LMTCB

## 2018-07-04 DIAGNOSIS — Z4802 Encounter for removal of sutures: Secondary | ICD-10-CM | POA: Diagnosis not present

## 2018-07-11 ENCOUNTER — Ambulatory Visit
Admission: RE | Admit: 2018-07-11 | Discharge: 2018-07-11 | Disposition: A | Discharge status: Home / Self Care | Payer: PPO | Source: Ambulatory Visit | Attending: Radiation Oncology | Admitting: Radiation Oncology

## 2018-07-11 DIAGNOSIS — R2242 Localized swelling, mass and lump, left lower limb: Secondary | ICD-10-CM | POA: Diagnosis not present

## 2018-07-11 DIAGNOSIS — Z510 Encounter for antineoplastic radiation therapy: Secondary | ICD-10-CM | POA: Insufficient documentation

## 2018-07-11 DIAGNOSIS — C4922 Malignant neoplasm of connective and soft tissue of left lower limb, including hip: Secondary | ICD-10-CM | POA: Diagnosis not present

## 2018-07-12 ENCOUNTER — Other Ambulatory Visit: Payer: Self-pay | Admitting: *Deleted

## 2018-07-12 DIAGNOSIS — C499 Malignant neoplasm of connective and soft tissue, unspecified: Secondary | ICD-10-CM

## 2018-07-18 ENCOUNTER — Ambulatory Visit
Admission: RE | Admit: 2018-07-18 | Discharge: 2018-07-18 | Disposition: A | Discharge status: Home / Self Care | Payer: PPO | Source: Ambulatory Visit | Attending: Radiation Oncology | Admitting: Radiation Oncology

## 2018-07-18 DIAGNOSIS — C4922 Malignant neoplasm of connective and soft tissue of left lower limb, including hip: Secondary | ICD-10-CM | POA: Diagnosis not present

## 2018-07-18 DIAGNOSIS — Z510 Encounter for antineoplastic radiation therapy: Secondary | ICD-10-CM | POA: Diagnosis not present

## 2018-07-19 ENCOUNTER — Ambulatory Visit
Admission: RE | Admit: 2018-07-19 | Discharge: 2018-07-19 | Disposition: A | Discharge status: Home / Self Care | Payer: PPO | Source: Ambulatory Visit | Attending: Radiation Oncology | Admitting: Radiation Oncology

## 2018-07-19 DIAGNOSIS — Z510 Encounter for antineoplastic radiation therapy: Secondary | ICD-10-CM | POA: Diagnosis not present

## 2018-07-20 ENCOUNTER — Ambulatory Visit
Admission: RE | Admit: 2018-07-20 | Discharge: 2018-07-20 | Disposition: A | Discharge status: Home / Self Care | Payer: PPO | Source: Ambulatory Visit | Attending: Radiation Oncology | Admitting: Radiation Oncology

## 2018-07-20 DIAGNOSIS — Z510 Encounter for antineoplastic radiation therapy: Secondary | ICD-10-CM | POA: Diagnosis not present

## 2018-07-21 ENCOUNTER — Ambulatory Visit
Admission: RE | Admit: 2018-07-21 | Discharge: 2018-07-21 | Disposition: A | Discharge status: Home / Self Care | Payer: PPO | Source: Ambulatory Visit | Attending: Radiation Oncology | Admitting: Radiation Oncology

## 2018-07-21 DIAGNOSIS — Z510 Encounter for antineoplastic radiation therapy: Secondary | ICD-10-CM | POA: Diagnosis not present

## 2018-07-24 ENCOUNTER — Ambulatory Visit
Admission: RE | Admit: 2018-07-24 | Discharge: 2018-07-24 | Disposition: A | Discharge status: Home / Self Care | Payer: PPO | Source: Ambulatory Visit | Attending: Radiation Oncology | Admitting: Radiation Oncology

## 2018-07-24 DIAGNOSIS — Z510 Encounter for antineoplastic radiation therapy: Secondary | ICD-10-CM | POA: Diagnosis not present

## 2018-07-25 ENCOUNTER — Ambulatory Visit
Admission: RE | Admit: 2018-07-25 | Discharge: 2018-07-25 | Disposition: A | Discharge status: Home / Self Care | Payer: PPO | Source: Ambulatory Visit | Attending: Radiation Oncology | Admitting: Radiation Oncology

## 2018-07-25 DIAGNOSIS — Z510 Encounter for antineoplastic radiation therapy: Secondary | ICD-10-CM | POA: Diagnosis not present

## 2018-07-25 DIAGNOSIS — C4922 Malignant neoplasm of connective and soft tissue of left lower limb, including hip: Secondary | ICD-10-CM | POA: Diagnosis not present

## 2018-07-26 ENCOUNTER — Ambulatory Visit
Admission: RE | Admit: 2018-07-26 | Discharge: 2018-07-26 | Disposition: A | Discharge status: Home / Self Care | Payer: PPO | Source: Ambulatory Visit | Attending: Radiation Oncology | Admitting: Radiation Oncology

## 2018-07-26 DIAGNOSIS — Z510 Encounter for antineoplastic radiation therapy: Secondary | ICD-10-CM | POA: Diagnosis not present

## 2018-07-27 ENCOUNTER — Ambulatory Visit
Admission: RE | Admit: 2018-07-27 | Discharge: 2018-07-27 | Disposition: A | Discharge status: Home / Self Care | Payer: PPO | Source: Ambulatory Visit | Attending: Radiation Oncology | Admitting: Radiation Oncology

## 2018-07-27 DIAGNOSIS — Z510 Encounter for antineoplastic radiation therapy: Secondary | ICD-10-CM | POA: Diagnosis not present

## 2018-07-28 ENCOUNTER — Ambulatory Visit
Admission: RE | Admit: 2018-07-28 | Discharge: 2018-07-28 | Disposition: A | Discharge status: Home / Self Care | Payer: PPO | Source: Ambulatory Visit | Attending: Radiation Oncology | Admitting: Radiation Oncology

## 2018-07-28 DIAGNOSIS — Z510 Encounter for antineoplastic radiation therapy: Secondary | ICD-10-CM | POA: Diagnosis not present

## 2018-07-31 ENCOUNTER — Ambulatory Visit
Admission: RE | Admit: 2018-07-31 | Discharge: 2018-07-31 | Disposition: A | Discharge status: Home / Self Care | Payer: PPO | Source: Ambulatory Visit | Attending: Radiation Oncology | Admitting: Radiation Oncology

## 2018-07-31 DIAGNOSIS — R2242 Localized swelling, mass and lump, left lower limb: Secondary | ICD-10-CM | POA: Insufficient documentation

## 2018-07-31 DIAGNOSIS — Z510 Encounter for antineoplastic radiation therapy: Secondary | ICD-10-CM | POA: Diagnosis not present

## 2018-08-01 ENCOUNTER — Other Ambulatory Visit (INDEPENDENT_AMBULATORY_CARE_PROVIDER_SITE_OTHER): Payer: Self-pay | Admitting: Vascular Surgery

## 2018-08-01 ENCOUNTER — Ambulatory Visit
Admission: RE | Admit: 2018-08-01 | Discharge: 2018-08-01 | Disposition: A | Discharge status: Home / Self Care | Payer: PPO | Source: Ambulatory Visit | Attending: Radiation Oncology | Admitting: Radiation Oncology

## 2018-08-01 ENCOUNTER — Inpatient Hospital Stay: Payer: PPO | Attending: Oncology

## 2018-08-01 DIAGNOSIS — C499 Malignant neoplasm of connective and soft tissue, unspecified: Secondary | ICD-10-CM

## 2018-08-01 DIAGNOSIS — C7652 Malignant neoplasm of left lower limb: Secondary | ICD-10-CM | POA: Diagnosis not present

## 2018-08-01 DIAGNOSIS — Z510 Encounter for antineoplastic radiation therapy: Secondary | ICD-10-CM | POA: Diagnosis not present

## 2018-08-01 DIAGNOSIS — C4922 Malignant neoplasm of connective and soft tissue of left lower limb, including hip: Secondary | ICD-10-CM | POA: Diagnosis not present

## 2018-08-01 LAB — CBC
HEMATOCRIT: 33.1 % — AB (ref 36.0–46.0)
Hemoglobin: 10.8 g/dL — ABNORMAL LOW (ref 12.0–15.0)
MCH: 29.9 pg (ref 26.0–34.0)
MCHC: 32.6 g/dL (ref 30.0–36.0)
MCV: 91.7 fL (ref 80.0–100.0)
Platelets: 389 10*3/uL (ref 150–400)
RBC: 3.61 MIL/uL — ABNORMAL LOW (ref 3.87–5.11)
RDW: 14.5 % (ref 11.5–15.5)
WBC: 7 10*3/uL (ref 4.0–10.5)
nRBC: 0 % (ref 0.0–0.2)

## 2018-08-02 ENCOUNTER — Ambulatory Visit
Admission: RE | Admit: 2018-08-02 | Discharge: 2018-08-02 | Disposition: A | Discharge status: Home / Self Care | Payer: PPO | Source: Ambulatory Visit | Attending: Radiation Oncology | Admitting: Radiation Oncology

## 2018-08-02 DIAGNOSIS — Z510 Encounter for antineoplastic radiation therapy: Secondary | ICD-10-CM | POA: Diagnosis not present

## 2018-08-03 ENCOUNTER — Ambulatory Visit
Admission: RE | Admit: 2018-08-03 | Discharge: 2018-08-03 | Disposition: A | Discharge status: Home / Self Care | Payer: PPO | Source: Ambulatory Visit | Attending: Radiation Oncology | Admitting: Radiation Oncology

## 2018-08-03 DIAGNOSIS — Z510 Encounter for antineoplastic radiation therapy: Secondary | ICD-10-CM | POA: Diagnosis not present

## 2018-08-04 ENCOUNTER — Ambulatory Visit
Admission: RE | Admit: 2018-08-04 | Discharge: 2018-08-04 | Disposition: A | Discharge status: Home / Self Care | Payer: PPO | Source: Ambulatory Visit | Attending: Radiation Oncology | Admitting: Radiation Oncology

## 2018-08-04 DIAGNOSIS — Z510 Encounter for antineoplastic radiation therapy: Secondary | ICD-10-CM | POA: Diagnosis not present

## 2018-08-07 ENCOUNTER — Ambulatory Visit
Admission: RE | Admit: 2018-08-07 | Discharge: 2018-08-07 | Disposition: A | Discharge status: Home / Self Care | Payer: PPO | Source: Ambulatory Visit | Attending: Radiation Oncology | Admitting: Radiation Oncology

## 2018-08-07 DIAGNOSIS — Z510 Encounter for antineoplastic radiation therapy: Secondary | ICD-10-CM | POA: Diagnosis not present

## 2018-08-08 ENCOUNTER — Other Ambulatory Visit: Payer: Self-pay

## 2018-08-08 ENCOUNTER — Ambulatory Visit
Admission: RE | Admit: 2018-08-08 | Discharge: 2018-08-08 | Disposition: A | Discharge status: Home / Self Care | Payer: PPO | Source: Ambulatory Visit | Attending: Radiation Oncology | Admitting: Radiation Oncology

## 2018-08-08 ENCOUNTER — Inpatient Hospital Stay: Payer: PPO

## 2018-08-08 DIAGNOSIS — Z510 Encounter for antineoplastic radiation therapy: Secondary | ICD-10-CM | POA: Diagnosis not present

## 2018-08-08 DIAGNOSIS — C499 Malignant neoplasm of connective and soft tissue, unspecified: Secondary | ICD-10-CM

## 2018-08-08 DIAGNOSIS — C4922 Malignant neoplasm of connective and soft tissue of left lower limb, including hip: Secondary | ICD-10-CM | POA: Diagnosis not present

## 2018-08-08 DIAGNOSIS — C7652 Malignant neoplasm of left lower limb: Secondary | ICD-10-CM | POA: Diagnosis not present

## 2018-08-08 LAB — CBC
HCT: 34 % — ABNORMAL LOW (ref 36.0–46.0)
Hemoglobin: 10.9 g/dL — ABNORMAL LOW (ref 12.0–15.0)
MCH: 29.1 pg (ref 26.0–34.0)
MCHC: 32.1 g/dL (ref 30.0–36.0)
MCV: 90.9 fL (ref 80.0–100.0)
Platelets: 442 10*3/uL — ABNORMAL HIGH (ref 150–400)
RBC: 3.74 MIL/uL — ABNORMAL LOW (ref 3.87–5.11)
RDW: 14 % (ref 11.5–15.5)
WBC: 7.7 10*3/uL (ref 4.0–10.5)
nRBC: 0 % (ref 0.0–0.2)

## 2018-08-09 ENCOUNTER — Ambulatory Visit
Admission: RE | Admit: 2018-08-09 | Discharge: 2018-08-09 | Disposition: A | Discharge status: Home / Self Care | Payer: PPO | Source: Ambulatory Visit | Attending: Radiation Oncology | Admitting: Radiation Oncology

## 2018-08-09 DIAGNOSIS — I10 Essential (primary) hypertension: Secondary | ICD-10-CM | POA: Diagnosis not present

## 2018-08-09 DIAGNOSIS — I350 Nonrheumatic aortic (valve) stenosis: Secondary | ICD-10-CM | POA: Diagnosis not present

## 2018-08-09 DIAGNOSIS — E782 Mixed hyperlipidemia: Secondary | ICD-10-CM | POA: Diagnosis not present

## 2018-08-09 DIAGNOSIS — Z510 Encounter for antineoplastic radiation therapy: Secondary | ICD-10-CM | POA: Diagnosis not present

## 2018-08-09 DIAGNOSIS — I2510 Atherosclerotic heart disease of native coronary artery without angina pectoris: Secondary | ICD-10-CM | POA: Diagnosis not present

## 2018-08-09 DIAGNOSIS — I2119 ST elevation (STEMI) myocardial infarction involving other coronary artery of inferior wall: Secondary | ICD-10-CM | POA: Diagnosis not present

## 2018-08-10 ENCOUNTER — Ambulatory Visit
Admission: RE | Admit: 2018-08-10 | Discharge: 2018-08-10 | Disposition: A | Discharge status: Home / Self Care | Payer: PPO | Source: Ambulatory Visit | Attending: Radiation Oncology | Admitting: Radiation Oncology

## 2018-08-10 ENCOUNTER — Other Ambulatory Visit: Payer: Self-pay

## 2018-08-10 DIAGNOSIS — Z510 Encounter for antineoplastic radiation therapy: Secondary | ICD-10-CM | POA: Diagnosis not present

## 2018-08-11 ENCOUNTER — Ambulatory Visit
Admission: RE | Admit: 2018-08-11 | Discharge: 2018-08-11 | Disposition: A | Discharge status: Home / Self Care | Payer: PPO | Source: Ambulatory Visit | Attending: Radiation Oncology | Admitting: Radiation Oncology

## 2018-08-11 ENCOUNTER — Other Ambulatory Visit: Payer: Self-pay

## 2018-08-11 DIAGNOSIS — Z510 Encounter for antineoplastic radiation therapy: Secondary | ICD-10-CM | POA: Diagnosis not present

## 2018-08-14 ENCOUNTER — Ambulatory Visit
Admission: RE | Admit: 2018-08-14 | Discharge: 2018-08-14 | Disposition: A | Discharge status: Home / Self Care | Payer: PPO | Source: Ambulatory Visit | Attending: Radiation Oncology | Admitting: Radiation Oncology

## 2018-08-14 ENCOUNTER — Other Ambulatory Visit: Payer: Self-pay

## 2018-08-14 DIAGNOSIS — M75111 Incomplete rotator cuff tear or rupture of right shoulder, not specified as traumatic: Secondary | ICD-10-CM | POA: Diagnosis not present

## 2018-08-14 DIAGNOSIS — M7581 Other shoulder lesions, right shoulder: Secondary | ICD-10-CM | POA: Diagnosis not present

## 2018-08-14 DIAGNOSIS — Z510 Encounter for antineoplastic radiation therapy: Secondary | ICD-10-CM | POA: Diagnosis not present

## 2018-08-15 ENCOUNTER — Other Ambulatory Visit: Payer: Self-pay

## 2018-08-15 ENCOUNTER — Ambulatory Visit
Admission: RE | Admit: 2018-08-15 | Discharge: 2018-08-15 | Disposition: A | Discharge status: Home / Self Care | Payer: PPO | Source: Ambulatory Visit | Attending: Radiation Oncology | Admitting: Radiation Oncology

## 2018-08-15 ENCOUNTER — Inpatient Hospital Stay: Payer: PPO

## 2018-08-15 DIAGNOSIS — C7652 Malignant neoplasm of left lower limb: Secondary | ICD-10-CM | POA: Diagnosis not present

## 2018-08-15 DIAGNOSIS — Z510 Encounter for antineoplastic radiation therapy: Secondary | ICD-10-CM | POA: Diagnosis not present

## 2018-08-15 DIAGNOSIS — C4922 Malignant neoplasm of connective and soft tissue of left lower limb, including hip: Secondary | ICD-10-CM | POA: Diagnosis not present

## 2018-08-15 DIAGNOSIS — C499 Malignant neoplasm of connective and soft tissue, unspecified: Secondary | ICD-10-CM

## 2018-08-15 LAB — CBC
HCT: 32.3 % — ABNORMAL LOW (ref 36.0–46.0)
Hemoglobin: 10.4 g/dL — ABNORMAL LOW (ref 12.0–15.0)
MCH: 29.3 pg (ref 26.0–34.0)
MCHC: 32.2 g/dL (ref 30.0–36.0)
MCV: 91 fL (ref 80.0–100.0)
NRBC: 0 % (ref 0.0–0.2)
Platelets: 437 10*3/uL — ABNORMAL HIGH (ref 150–400)
RBC: 3.55 MIL/uL — ABNORMAL LOW (ref 3.87–5.11)
RDW: 13.9 % (ref 11.5–15.5)
WBC: 9.1 10*3/uL (ref 4.0–10.5)

## 2018-08-16 ENCOUNTER — Other Ambulatory Visit: Payer: Self-pay

## 2018-08-16 ENCOUNTER — Ambulatory Visit
Admission: RE | Admit: 2018-08-16 | Discharge: 2018-08-16 | Disposition: A | Discharge status: Home / Self Care | Payer: PPO | Source: Ambulatory Visit | Attending: Radiation Oncology | Admitting: Radiation Oncology

## 2018-08-16 DIAGNOSIS — Z510 Encounter for antineoplastic radiation therapy: Secondary | ICD-10-CM | POA: Diagnosis not present

## 2018-08-17 ENCOUNTER — Ambulatory Visit
Admission: RE | Admit: 2018-08-17 | Discharge: 2018-08-17 | Disposition: A | Discharge status: Home / Self Care | Payer: PPO | Source: Ambulatory Visit | Attending: Radiation Oncology | Admitting: Radiation Oncology

## 2018-08-17 ENCOUNTER — Other Ambulatory Visit: Payer: Self-pay

## 2018-08-17 DIAGNOSIS — C4922 Malignant neoplasm of connective and soft tissue of left lower limb, including hip: Secondary | ICD-10-CM | POA: Diagnosis not present

## 2018-08-17 DIAGNOSIS — D509 Iron deficiency anemia, unspecified: Secondary | ICD-10-CM | POA: Diagnosis not present

## 2018-08-17 DIAGNOSIS — I2510 Atherosclerotic heart disease of native coronary artery without angina pectoris: Secondary | ICD-10-CM | POA: Diagnosis not present

## 2018-08-17 DIAGNOSIS — R140 Abdominal distension (gaseous): Secondary | ICD-10-CM | POA: Diagnosis not present

## 2018-08-17 DIAGNOSIS — Z510 Encounter for antineoplastic radiation therapy: Secondary | ICD-10-CM | POA: Diagnosis not present

## 2018-08-17 DIAGNOSIS — K51919 Ulcerative colitis, unspecified with unspecified complications: Secondary | ICD-10-CM | POA: Diagnosis not present

## 2018-08-17 DIAGNOSIS — K5730 Diverticulosis of large intestine without perforation or abscess without bleeding: Secondary | ICD-10-CM | POA: Diagnosis not present

## 2018-08-18 ENCOUNTER — Other Ambulatory Visit: Payer: Self-pay

## 2018-08-18 ENCOUNTER — Ambulatory Visit
Admission: RE | Admit: 2018-08-18 | Discharge: 2018-08-18 | Disposition: A | Discharge status: Home / Self Care | Payer: PPO | Source: Ambulatory Visit | Attending: Radiation Oncology | Admitting: Radiation Oncology

## 2018-08-18 DIAGNOSIS — Z510 Encounter for antineoplastic radiation therapy: Secondary | ICD-10-CM | POA: Diagnosis not present

## 2018-08-20 ENCOUNTER — Other Ambulatory Visit: Payer: Self-pay

## 2018-08-21 ENCOUNTER — Other Ambulatory Visit: Payer: Self-pay

## 2018-08-21 ENCOUNTER — Ambulatory Visit
Admission: RE | Admit: 2018-08-21 | Discharge: 2018-08-21 | Disposition: A | Discharge status: Home / Self Care | Payer: PPO | Source: Ambulatory Visit | Attending: Radiation Oncology | Admitting: Radiation Oncology

## 2018-08-21 ENCOUNTER — Encounter: Payer: Self-pay | Admitting: Family Medicine

## 2018-08-21 ENCOUNTER — Ambulatory Visit: Payer: Self-pay

## 2018-08-21 DIAGNOSIS — Z510 Encounter for antineoplastic radiation therapy: Secondary | ICD-10-CM | POA: Diagnosis not present

## 2018-08-22 ENCOUNTER — Other Ambulatory Visit: Payer: Self-pay

## 2018-08-22 ENCOUNTER — Ambulatory Visit
Admission: RE | Admit: 2018-08-22 | Discharge: 2018-08-22 | Disposition: A | Discharge status: Home / Self Care | Payer: PPO | Source: Ambulatory Visit | Attending: Radiation Oncology | Admitting: Radiation Oncology

## 2018-08-22 DIAGNOSIS — Z510 Encounter for antineoplastic radiation therapy: Secondary | ICD-10-CM | POA: Diagnosis not present

## 2018-08-22 DIAGNOSIS — C4922 Malignant neoplasm of connective and soft tissue of left lower limb, including hip: Secondary | ICD-10-CM | POA: Diagnosis not present

## 2018-08-24 ENCOUNTER — Telehealth: Payer: Self-pay

## 2018-08-24 NOTE — Telephone Encounter (Signed)
Called pt to schedule her AWV over the phone and pt declined and stated she would like to wait to have this and her CPE completed at a later time. Note made.  -MM

## 2018-09-06 DIAGNOSIS — I350 Nonrheumatic aortic (valve) stenosis: Secondary | ICD-10-CM | POA: Diagnosis not present

## 2018-09-06 DIAGNOSIS — E782 Mixed hyperlipidemia: Secondary | ICD-10-CM | POA: Diagnosis not present

## 2018-09-06 DIAGNOSIS — I2510 Atherosclerotic heart disease of native coronary artery without angina pectoris: Secondary | ICD-10-CM | POA: Diagnosis not present

## 2018-09-12 DIAGNOSIS — I2510 Atherosclerotic heart disease of native coronary artery without angina pectoris: Secondary | ICD-10-CM | POA: Diagnosis not present

## 2018-09-12 DIAGNOSIS — E782 Mixed hyperlipidemia: Secondary | ICD-10-CM | POA: Diagnosis not present

## 2018-09-12 DIAGNOSIS — I350 Nonrheumatic aortic (valve) stenosis: Secondary | ICD-10-CM | POA: Diagnosis not present

## 2018-09-12 DIAGNOSIS — I10 Essential (primary) hypertension: Secondary | ICD-10-CM | POA: Diagnosis not present

## 2018-09-12 DIAGNOSIS — I2119 ST elevation (STEMI) myocardial infarction involving other coronary artery of inferior wall: Secondary | ICD-10-CM | POA: Diagnosis not present

## 2018-09-19 DIAGNOSIS — J479 Bronchiectasis, uncomplicated: Secondary | ICD-10-CM | POA: Diagnosis not present

## 2018-09-19 DIAGNOSIS — I2510 Atherosclerotic heart disease of native coronary artery without angina pectoris: Secondary | ICD-10-CM | POA: Diagnosis not present

## 2018-09-19 DIAGNOSIS — Z87891 Personal history of nicotine dependence: Secondary | ICD-10-CM | POA: Diagnosis not present

## 2018-09-19 DIAGNOSIS — I252 Old myocardial infarction: Secondary | ICD-10-CM | POA: Diagnosis not present

## 2018-09-19 DIAGNOSIS — K219 Gastro-esophageal reflux disease without esophagitis: Secondary | ICD-10-CM | POA: Diagnosis not present

## 2018-09-19 DIAGNOSIS — I701 Atherosclerosis of renal artery: Secondary | ICD-10-CM | POA: Diagnosis not present

## 2018-09-19 DIAGNOSIS — Z01818 Encounter for other preprocedural examination: Secondary | ICD-10-CM | POA: Diagnosis not present

## 2018-09-19 DIAGNOSIS — C4922 Malignant neoplasm of connective and soft tissue of left lower limb, including hip: Secondary | ICD-10-CM | POA: Diagnosis not present

## 2018-09-19 DIAGNOSIS — I779 Disorder of arteries and arterioles, unspecified: Secondary | ICD-10-CM | POA: Diagnosis not present

## 2018-09-19 DIAGNOSIS — Z8673 Personal history of transient ischemic attack (TIA), and cerebral infarction without residual deficits: Secondary | ICD-10-CM | POA: Diagnosis not present

## 2018-09-19 DIAGNOSIS — I10 Essential (primary) hypertension: Secondary | ICD-10-CM | POA: Diagnosis not present

## 2018-09-19 DIAGNOSIS — C220 Liver cell carcinoma: Secondary | ICD-10-CM | POA: Diagnosis not present

## 2018-09-19 DIAGNOSIS — E0590 Thyrotoxicosis, unspecified without thyrotoxic crisis or storm: Secondary | ICD-10-CM | POA: Diagnosis not present

## 2018-09-19 DIAGNOSIS — E039 Hypothyroidism, unspecified: Secondary | ICD-10-CM | POA: Diagnosis not present

## 2018-09-22 ENCOUNTER — Other Ambulatory Visit: Payer: Self-pay

## 2018-09-22 ENCOUNTER — Encounter: Payer: Self-pay | Admitting: Radiation Oncology

## 2018-09-22 ENCOUNTER — Ambulatory Visit
Admission: RE | Admit: 2018-09-22 | Discharge: 2018-09-22 | Disposition: A | Discharge status: Home / Self Care | Payer: PPO | Source: Ambulatory Visit | Attending: Radiation Oncology | Admitting: Radiation Oncology

## 2018-09-22 VITALS — BP 133/77 | HR 95 | Temp 98.1°F | Resp 18 | Wt 137.1 lb

## 2018-09-22 DIAGNOSIS — Z923 Personal history of irradiation: Secondary | ICD-10-CM | POA: Diagnosis not present

## 2018-09-22 DIAGNOSIS — C4922 Malignant neoplasm of connective and soft tissue of left lower limb, including hip: Secondary | ICD-10-CM | POA: Diagnosis not present

## 2018-09-22 DIAGNOSIS — C499 Malignant neoplasm of connective and soft tissue, unspecified: Secondary | ICD-10-CM

## 2018-09-22 NOTE — Progress Notes (Signed)
Radiation Oncology Follow up Note  Name: Misty Page   Date:   09/22/2018 MRN:  [id number] DOB: 1941/04/28    This 78 y.o. female presents to the clinic today for 1 month follow-up status post neoadjuvant radiation therapy for patient with high-grade sarcoma of the left thigh.  REFERRING PROVIDER: Virginia Crews, MD  HPI: Patient is a 78 year old female now at 1 month having completed neoadjuvant radiation therapy for a.  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  She is seen today in routine follow-up and is doing fairly well.  She had some significant skin changes they have resolved.  She had a repeat MRI scan recently at [facility] showing increased size of sarcoma of the anterior compartment of the thigh with increased areas of necrosis and internal blood products.  She is scheduled for surgery at this Monday at [facility].  She is ambulating well without assistance.  COMPLICATIONS OF TREATMENT: none  FOLLOW UP COMPLIANCE: keeps appointments   PHYSICAL EXAM:  BP 133/77 (BP Location: Left Arm, Patient Position: Sitting)   Pulse 95   Temp 98.1 F (36.7 C) (Tympanic)   Resp 18   Wt 137 lb 2 oz (62.2 kg)   BMI 25.91 kg/m  Area of her left thigh has some dry desquamation of the skin and some hyperpigmentation of the skin but physical exam it seems to have slightly decreased in size.  Well-developed well-nourished patient in NAD. HEENT reveals PERLA, EOMI, discs not visualized.  Oral cavity is clear. No oral mucosal lesions are identified. Neck is clear without evidence of cervical or supraclavicular adenopathy. Lungs are clear to A&P. Cardiac examination is essentially unremarkable with regular rate and rhythm without murmur rub or thrill. Abdomen is benign with no organomegaly or masses noted. Motor sensory and DTR levels are equal and symmetric in the upper and lower extremities. Cranial nerves II through XII are grossly intact. Proprioception is intact. No  peripheral adenopathy or edema is identified. No motor or sensory levels are noted. Crude visual fields are within normal range.  RADIOLOGY RESULTS: MRI reports are reviewed  PLAN: Present time patient will undergo surgery this Monday.  I will see her back in 1 month for follow-up.  At that time after review of pathology will decide on any further radiation therapy to her tumor bed.  Patient comprehends my treatment plan well and rationale for follow-up visit.  I would like to take this opportunity to thank you for allowing me to participate in the care of your patient.Noreene Filbert, MD

## 2018-09-25 DIAGNOSIS — D4989 Neoplasm of unspecified behavior of other specified sites: Secondary | ICD-10-CM | POA: Diagnosis not present

## 2018-09-25 DIAGNOSIS — I10 Essential (primary) hypertension: Secondary | ICD-10-CM | POA: Diagnosis not present

## 2018-09-25 DIAGNOSIS — Z751 Person awaiting admission to adequate facility elsewhere: Secondary | ICD-10-CM | POA: Diagnosis not present

## 2018-09-25 DIAGNOSIS — M79652 Pain in left thigh: Secondary | ICD-10-CM | POA: Diagnosis not present

## 2018-09-25 DIAGNOSIS — F419 Anxiety disorder, unspecified: Secondary | ICD-10-CM | POA: Diagnosis not present

## 2018-09-25 DIAGNOSIS — C4922 Malignant neoplasm of connective and soft tissue of left lower limb, including hip: Secondary | ICD-10-CM | POA: Diagnosis not present

## 2018-09-25 DIAGNOSIS — Z955 Presence of coronary angioplasty implant and graft: Secondary | ICD-10-CM | POA: Diagnosis not present

## 2018-09-25 DIAGNOSIS — I252 Old myocardial infarction: Secondary | ICD-10-CM | POA: Diagnosis not present

## 2018-09-25 DIAGNOSIS — I959 Hypotension, unspecified: Secondary | ICD-10-CM | POA: Diagnosis not present

## 2018-09-25 DIAGNOSIS — R609 Edema, unspecified: Secondary | ICD-10-CM | POA: Diagnosis not present

## 2018-09-25 DIAGNOSIS — I951 Orthostatic hypotension: Secondary | ICD-10-CM | POA: Diagnosis not present

## 2018-09-25 DIAGNOSIS — Z7902 Long term (current) use of antithrombotics/antiplatelets: Secondary | ICD-10-CM | POA: Diagnosis not present

## 2018-09-25 DIAGNOSIS — I350 Nonrheumatic aortic (valve) stenosis: Secondary | ICD-10-CM | POA: Diagnosis not present

## 2018-09-25 DIAGNOSIS — E0590 Thyrotoxicosis, unspecified without thyrotoxic crisis or storm: Secondary | ICD-10-CM | POA: Diagnosis not present

## 2018-09-25 DIAGNOSIS — D62 Acute posthemorrhagic anemia: Secondary | ICD-10-CM | POA: Diagnosis not present

## 2018-09-25 DIAGNOSIS — E785 Hyperlipidemia, unspecified: Secondary | ICD-10-CM | POA: Diagnosis not present

## 2018-09-25 DIAGNOSIS — I701 Atherosclerosis of renal artery: Secondary | ICD-10-CM | POA: Diagnosis not present

## 2018-09-25 DIAGNOSIS — Z87891 Personal history of nicotine dependence: Secondary | ICD-10-CM | POA: Diagnosis not present

## 2018-09-25 DIAGNOSIS — K5190 Ulcerative colitis, unspecified, without complications: Secondary | ICD-10-CM | POA: Diagnosis not present

## 2018-09-25 DIAGNOSIS — I2510 Atherosclerotic heart disease of native coronary artery without angina pectoris: Secondary | ICD-10-CM | POA: Diagnosis not present

## 2018-09-25 DIAGNOSIS — K219 Gastro-esophageal reflux disease without esophagitis: Secondary | ICD-10-CM | POA: Diagnosis not present

## 2018-09-25 DIAGNOSIS — G8929 Other chronic pain: Secondary | ICD-10-CM | POA: Diagnosis not present

## 2018-09-25 DIAGNOSIS — G8918 Other acute postprocedural pain: Secondary | ICD-10-CM | POA: Diagnosis not present

## 2018-09-25 DIAGNOSIS — E871 Hypo-osmolality and hyponatremia: Secondary | ICD-10-CM | POA: Diagnosis not present

## 2018-09-26 DIAGNOSIS — D62 Acute posthemorrhagic anemia: Secondary | ICD-10-CM | POA: Insufficient documentation

## 2018-09-27 DIAGNOSIS — E871 Hypo-osmolality and hyponatremia: Secondary | ICD-10-CM | POA: Insufficient documentation

## 2018-09-29 ENCOUNTER — Ambulatory Visit: Payer: PPO | Admitting: Oncology

## 2018-10-02 ENCOUNTER — Encounter (INDEPENDENT_AMBULATORY_CARE_PROVIDER_SITE_OTHER): Payer: PPO

## 2018-10-02 ENCOUNTER — Ambulatory Visit (INDEPENDENT_AMBULATORY_CARE_PROVIDER_SITE_OTHER): Payer: PPO | Admitting: Nurse Practitioner

## 2018-10-05 DIAGNOSIS — C4490 Unspecified malignant neoplasm of skin, unspecified: Secondary | ICD-10-CM | POA: Diagnosis not present

## 2018-10-05 DIAGNOSIS — I119 Hypertensive heart disease without heart failure: Secondary | ICD-10-CM | POA: Diagnosis not present

## 2018-10-05 DIAGNOSIS — Z4801 Encounter for change or removal of surgical wound dressing: Secondary | ICD-10-CM | POA: Diagnosis not present

## 2018-10-05 DIAGNOSIS — Z483 Aftercare following surgery for neoplasm: Secondary | ICD-10-CM | POA: Diagnosis not present

## 2018-10-05 DIAGNOSIS — I6523 Occlusion and stenosis of bilateral carotid arteries: Secondary | ICD-10-CM | POA: Diagnosis not present

## 2018-10-05 DIAGNOSIS — Z95828 Presence of other vascular implants and grafts: Secondary | ICD-10-CM | POA: Diagnosis not present

## 2018-10-05 DIAGNOSIS — I739 Peripheral vascular disease, unspecified: Secondary | ICD-10-CM | POA: Diagnosis not present

## 2018-10-05 DIAGNOSIS — K5190 Ulcerative colitis, unspecified, without complications: Secondary | ICD-10-CM | POA: Diagnosis not present

## 2018-10-05 DIAGNOSIS — I2510 Atherosclerotic heart disease of native coronary artery without angina pectoris: Secondary | ICD-10-CM | POA: Diagnosis not present

## 2018-10-05 DIAGNOSIS — Z955 Presence of coronary angioplasty implant and graft: Secondary | ICD-10-CM | POA: Diagnosis not present

## 2018-10-05 DIAGNOSIS — Z7902 Long term (current) use of antithrombotics/antiplatelets: Secondary | ICD-10-CM | POA: Diagnosis not present

## 2018-10-05 DIAGNOSIS — Z96642 Presence of left artificial hip joint: Secondary | ICD-10-CM | POA: Diagnosis not present

## 2018-10-05 DIAGNOSIS — E785 Hyperlipidemia, unspecified: Secondary | ICD-10-CM | POA: Diagnosis not present

## 2018-10-05 DIAGNOSIS — Z87891 Personal history of nicotine dependence: Secondary | ICD-10-CM | POA: Diagnosis not present

## 2018-10-05 DIAGNOSIS — C4922 Malignant neoplasm of connective and soft tissue of left lower limb, including hip: Secondary | ICD-10-CM | POA: Diagnosis not present

## 2018-10-05 DIAGNOSIS — I252 Old myocardial infarction: Secondary | ICD-10-CM | POA: Diagnosis not present

## 2018-10-05 DIAGNOSIS — Z8673 Personal history of transient ischemic attack (TIA), and cerebral infarction without residual deficits: Secondary | ICD-10-CM | POA: Diagnosis not present

## 2018-10-09 ENCOUNTER — Telehealth: Payer: Self-pay

## 2018-10-09 NOTE — Telephone Encounter (Signed)
Lattie Haw from [facility] had called wanting to speak with CMA about rash that patient reports that was on her face spreading down to patients shoulders. KW

## 2018-10-10 NOTE — Telephone Encounter (Signed)
Patient's daughter states she contacted another provider for a medication. A medication was sent in for pt.

## 2018-10-10 NOTE — Telephone Encounter (Signed)
Can we schedule patient an appointment please. She can do a video visit if able, sounds like she had a temperature.

## 2018-10-11 NOTE — Telephone Encounter (Signed)
[name] from orthopedic surgery called asking if we could reach out to the patient. Patient recently had surgery with them, but has been calling their office stating that she is in more pain due to shingles. Misty Page says that they currently have patient taking Oxycodone. Misty Page wanted to know if we could reach out to the patient and prescribe something like Gabapentin to help with Shingles pain. I called and spoke with patient's daughter Misty Page. I was able to schedule a virtual visit for tomorrow 10/12/2018 at 9:20am.

## 2018-10-12 ENCOUNTER — Encounter: Payer: Self-pay | Admitting: Family Medicine

## 2018-10-12 ENCOUNTER — Inpatient Hospital Stay: Payer: PPO | Admitting: Physician Assistant

## 2018-10-12 ENCOUNTER — Ambulatory Visit (INDEPENDENT_AMBULATORY_CARE_PROVIDER_SITE_OTHER): Payer: PPO | Admitting: Family Medicine

## 2018-10-12 DIAGNOSIS — C4922 Malignant neoplasm of connective and soft tissue of left lower limb, including hip: Secondary | ICD-10-CM | POA: Diagnosis not present

## 2018-10-12 DIAGNOSIS — B029 Zoster without complications: Secondary | ICD-10-CM

## 2018-10-12 DIAGNOSIS — Z483 Aftercare following surgery for neoplasm: Secondary | ICD-10-CM | POA: Diagnosis not present

## 2018-10-12 MED ORDER — GABAPENTIN 100 MG PO CAPS: 100.0000 mg | ORAL_CAPSULE | Freq: Three times a day (TID) | ORAL | 3 refills | Status: DC

## 2018-10-12 NOTE — Progress Notes (Signed)
Patient: Misty Page Female    DOB: 1940/06/29   78 y.o.   MRN: [id number] Visit Date: 10/16/2018  Today's Provider: Lavon Paganini, MD   Chief Complaint  Patient presents with  . Herpes Zoster   Subjective:    Virtual Visit via Video Note  I connected with Misty Page on 10/16/18 at  9:20 AM EDT by a video enabled telemedicine application and verified that I am speaking with the correct person using two identifiers.   Patient location: home Provider location: home office Persons involved in the visit: patient, provider    I discussed the limitations of evaluation and management by telemedicine and the availability of in person appointments. The patient expressed understanding and agreed to proceed.  HPI Shingles:  Patient presents for a 2 day follow up. Patient was seen by a family friend. Patient started Valtrex 2 days ago. Rash started ~1 week ago.  She has been taking valtrex which was Rx'd by a family friend.  She took some percocet for pain, but no longer needing this. Patient's symptoms have improved. She is experiencing headache, severe pain and headache. The shingles in around her neck, left side of face, and back.   Allergies  Allergen Reactions  . Amoxicillin Anaphylaxis  . Naproxen Hives, Shortness Of Breath, Nausea And Vomiting and Swelling  . Penicillins Shortness Of Breath, Swelling and Rash    Has patient had a PCN reaction causing immediate rash, facial/tongue/throat swelling, SOB or lightheadedness with hypotension: Yes Has patient had a PCN reaction causing severe rash involving mucus membranes or skin necrosis: Yes Has patient had a PCN reaction that required hospitalization: Yes Has patient had a PCN reaction occurring within the last 10 years: No  If all of the above answers are "NO", then may proceed with Cephalosporin use.   . Codeine Nausea And Vomiting  . Levofloxacin Nausea And Vomiting  . Shellfish Allergy Hives, Diarrhea, Nausea And  Vomiting and Swelling     Current Outpatient Medications:  .  Ascorbic Acid (VITAMIN C) 1000 MG tablet, Take 500 mg by mouth daily. , Disp: , Rfl:  .  B COMPLEX VITAMINS PO, Take 1 tablet by mouth daily. B COMPLEX (Oral Capsule)  1 Every Day for 0 days  Quantity: 0.00;  Refills: 0   Ordered :13-Apr-2010  Celene Kras, MA, Anastasiya ;  Started 29-October-2008 Active Comments: DX: 414.00, Disp: , Rfl:  .  Calcium-Vitamin D-Vitamin K 500-100-40 MG-UNT-MCG CHEW, Chew 1 tablet by mouth 2 (two) times daily. VIACTIV, 500-100-40 (Oral Tablet Chewable)  1 tablet twice a day for 0 days  Quantity: 0.00;  Refills: 0   Ordered :13-Apr-2010  Celene Kras, MA, Anastasiya ;  Started 29-October-2008 Active Comments: DX: 733.00, Disp: , Rfl:  .  clopidogrel (PLAVIX) 75 MG tablet, TAKE 1 TABLET BY MOUTH ONCE A DAY, Disp: 30 tablet, Rfl: 5 .  Coenzyme Q10 (COQ10) 200 MG CAPS, Take 1 capsule by mouth at bedtime., Disp: , Rfl:  .  Cranberry 300 MG tablet, Take 300 mg by mouth daily. , Disp: , Rfl:  .  docusate sodium (COLACE) 100 MG capsule, Take 100 mg by mouth 2 (two) times daily., Disp: , Rfl:  .  doxycycline (VIBRAMYCIN) 100 MG capsule, , Disp: , Rfl:  .  ezetimibe (ZETIA) 10 MG tablet, , Disp: , Rfl:  .  famotidine (PEPCID) 40 MG tablet, TAKE ONE (1) TABLET BY MOUTH EVERY DAY, Disp: 90 tablet, Rfl: 1 .  ferrous sulfate  325 (65 FE) MG EC tablet, Take 325 mg by mouth daily. FERROUS SULFATE, 325 (65 Fe)MG (Oral Tablet Delayed Release)  1 every day for 0 days  Quantity: 0.00;  Refills: 0   Ordered :13-Apr-2010  Celene Kras, MA, Anastasiya ;  Started 29-October-2008 Active Comments: DX: 285.9, Disp: , Rfl:  .  fexofenadine (ALLEGRA) 180 MG tablet, Take 180 mg by mouth at bedtime. , Disp: , Rfl:  .  fluticasone (FLONASE) 50 MCG/ACT nasal spray, USE 2 SPRAYS EACH NOSTRIL DAILY, Disp: 48 g, Rfl: 1 .  hydrochlorothiazide (HYDRODIURIL) 25 MG tablet, TAKE ONE (1) TABLET EACH DAY, Disp: 90 tablet, Rfl: 1 .  hyoscyamine (LEVSIN,  ANASPAZ) 0.125 MG tablet, Take by mouth., Disp: , Rfl:  .  Mesalamine (ASACOL HD) 800 MG TBEC, TAKE 2 TABLETS BY MOUTH TWICE A DAY., Disp: , Rfl:  .  methimazole (TAPAZOLE) 5 MG tablet, Take 2.5 mg by mouth. , Disp: , Rfl:  .  montelukast (SINGULAIR) 10 MG tablet, Take 1 tablet (10 mg total) by mouth at bedtime., Disp: 90 tablet, Rfl: 1 .  Multiple Vitamins-Minerals (CENTRUM SILVER PO), Take 1 capsule by mouth daily., Disp: , Rfl:  .  OxyCODONE HCl, Abuse Deter, (OXAYDO) 5 MG TABA, Take by mouth., Disp: , Rfl:  .  rosuvastatin (CRESTOR) 20 MG tablet, Take 0.5 tablets (10 mg total) by mouth daily., Disp: 45 tablet, Rfl: 2 .  triamcinolone cream (KENALOG) 0.1 %, , Disp: , Rfl:  .  dicyclomine (BENTYL) 10 MG capsule, , Disp: , Rfl:  .  gabapentin (NEURONTIN) 100 MG capsule, Take 1 capsule (100 mg total) by mouth 3 (three) times daily., Disp: 90 capsule, Rfl: 3 .  losartan (COZAAR) 100 MG tablet, TAKE ONE (1) TABLET EACH DAY (Patient not taking: Reported on 10/12/2018), Disp: 90 tablet, Rfl: 2  Review of Systems  Constitutional: Negative.   Respiratory: Negative.   Cardiovascular: Negative.   Musculoskeletal: Negative.   Skin:       Shingles     Social History   Tobacco Use  . Smoking status: Former Smoker    Last attempt to quit: 05/30/1994    Years since quitting: 24.3  . Smokeless tobacco: Never Used  Substance Use Topics  . Alcohol use: Yes    Comment: rare      Objective:   There were no vitals taken for this visit. There were no vitals filed for this visit.   Physical Exam Constitutional:      Appearance: Normal appearance.  Pulmonary:     Effort: Pulmonary effort is normal. No respiratory distress.  Skin:    Comments: Able to appreciate rash of neck and face, but details difficult to see due to poor video quality  Neurological:     Mental Status: She is alert and oriented to person, place, and time. Mental status is at baseline.  Psychiatric:        Mood and Affect:  Mood normal.        Behavior: Behavior normal.         Assessment & Plan    I discussed the assessment and treatment plan with the patient. The patient was provided an opportunity to ask questions and all were answered. The patient agreed with the plan and demonstrated an understanding of the instructions.   The patient was advised to call back or seek an in-person evaluation if the symptoms worsen or if the condition fails to improve as anticipated.  1. Herpes zoster without complication - continue  Valtrex treatment - seemss to be improving - start gabapentin for pain control - discussed to use this regularly, not prn - no signs of infection, but discussed what to watch for - return precautions discussed    Meds ordered this encounter  Medications  . gabapentin (NEURONTIN) 100 MG capsule    Sig: Take 1 capsule (100 mg total) by mouth 3 (three) times daily.    Dispense:  90 capsule    Refill:  3     Return if symptoms worsen or fail to improve.   The entirety of the information documented in the History of Present Illness, Review of Systems and Physical Exam were personally obtained by me. Portions of this information were initially documented by Tiburcio Pea, CMA and reviewed by me for thoroughness and accuracy.    Paighton Godette, Dionne Bucy, MD, MPH [facility]

## 2018-10-17 DIAGNOSIS — D649 Anemia, unspecified: Secondary | ICD-10-CM | POA: Diagnosis not present

## 2018-10-17 DIAGNOSIS — C4922 Malignant neoplasm of connective and soft tissue of left lower limb, including hip: Secondary | ICD-10-CM | POA: Diagnosis not present

## 2018-10-27 NOTE — Progress Notes (Deleted)
[place]  Telephone:(336) [phone] Fax:(336) [phone]  ID: Misty Page OB: [birth year]  MR#: [id number]  TDH#:[id number]  Patient Care Team: Virginia Crews, MD as PCP - General (Family Medicine) Schnier, Dolores Lory, MD as Consulting Physician (Vascular Surgery) Corey Skains, MD as Consulting Physician (Cardiology) Lonia Farber, MD as Consulting Physician (Internal Medicine) Poggi, Marshall Cork, MD as Consulting Physician (Surgery) Corey Skains, MD as Consulting Physician (Cardiology) Bertram Gala as Physician Assistant (Gastroenterology) Rubye Beach as Physician Assistant (Family Medicine)  CHIEF COMPLAINT: 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  INTERVAL HISTORY: Patient returns to clinic today for further evaluation and treatment planning.  She was evaluated at [facility] and recommendation was to proceed with neoadjuvant XRT followed by surgical resection.  Patient currently feels well and is asymptomatic.  She does not complain of pain today.  She has no neurologic complaints.  She denies any recent fevers or illnesses.  She has a good appetite and denies weight loss.  She has no chest pain or shortness of breath.  She denies any nausea, vomiting, constipation, or diarrhea.  She has no melena or hematochezia.  She has no urinary complaints.  Patient offers no specific complaints today.  REVIEW OF SYSTEMS:   Review of Systems  Constitutional: Negative.  Negative for fever, malaise/fatigue and weight loss.  Respiratory: Negative.  Negative for cough, hemoptysis and shortness of breath.   Cardiovascular: Positive for leg swelling. Negative for chest pain.  Gastrointestinal: Negative.  Negative for abdominal pain, blood in stool and melena.  Genitourinary: Negative.  Negative for dysuria and flank pain.  Musculoskeletal: Negative.  Negative for back pain.  Skin: Negative.  Negative for rash.  Neurological: Negative.   Negative for focal weakness, weakness and headaches.  Psychiatric/Behavioral: Negative.  The patient is not nervous/anxious.     As per HPI. Otherwise, a complete review of systems is negative.  PAST MEDICAL HISTORY: Past Medical History:  Diagnosis Date   Allergy    Anemia    Arthritis    Colitis    Complication of anesthesia    nausea   Environmental and seasonal allergies    Fibrocystic breast disease (FCBD)    GERD (gastroesophageal reflux disease)    High cholesterol    History of heart artery stent    Hyperlipidemia    Hypertension    Myocardial infarction [facility])    Osteoporosis    Thyroid disease    TIA (transient ischemic attack)     PAST SURGICAL HISTORY: Past Surgical History:  Procedure Laterality Date   ABDOMINAL HYSTERECTOMY  2007   BREAST ENHANCEMENT SURGERY  2004   BREAST IMPLANT REMOVAL     BREAST SURGERY  1984   Fibrocystic Disease   CAROTID ENDARTERECTOMY Left 06/21/2014   Dr. Delana Meyer   CAROTID ENDARTERECTOMY Right 08/02/2014   Dr. Delana Meyer   CAROTID PTA/STENT INTERVENTION N/A 06/15/2017   Procedure: CAROTID PTA/STENT INTERVENTION;  Surgeon: Katha Cabal, MD;  Location: [place] [facility];  Service: Cardiovascular;  Laterality: N/A;   COLONOSCOPY WITH PROPOFOL N/A 07/14/2015   Procedure: COLONOSCOPY WITH PROPOFOL;  Surgeon: Hulen Luster, MD;  Location: [facility] ENDOSCOPY;  Service: Gastroenterology;  Laterality: N/A;   COLONOSCOPY WITH PROPOFOL N/A 02/08/2018   Procedure: COLONOSCOPY WITH PROPOFOL;  Surgeon: Toledo, Benay Pike, MD;  Location: [facility] ENDOSCOPY;  Service: Gastroenterology;  Laterality: N/A;   CORONARY ANGIOPLASTY WITH STENT PLACEMENT  1999   ESOPHAGOGASTRODUODENOSCOPY (EGD) WITH PROPOFOL N/A 02/08/2018  Procedure: ESOPHAGOGASTRODUODENOSCOPY (EGD) WITH PROPOFOL;  Surgeon: Toledo, Benay Pike, MD;  Location: [facility] ENDOSCOPY;  Service: Gastroenterology;  Laterality: N/A;   FEMORAL ARTERY STENT  08/2003   HERNIA REPAIR   2008   RENAL ARTERY STENT  02/6268   TRANSUMBILICAL AUGMENTATION MAMMAPLASTY      FAMILY HISTORY: Family History  Problem Relation Age of Onset   Hyperlipidemia Mother    Congestive Heart Failure Mother    COPD Mother    Heart disease Mother    Hypertension Mother    Osteoporosis Mother    Pancreatic cancer Father    Arthritis Sister    Alzheimer's disease Sister    Hypertension Sister    Prostate cancer Brother    Heart attack Brother    Stomach cancer Brother    Kidney failure Brother    Leukemia Brother    Emphysema Brother    Pancreatic cancer Brother    Stomach cancer Brother    Breast cancer Paternal Grandmother    Leukemia Paternal Grandfather    Heart attack Maternal Uncle    Stroke Maternal Aunt     ADVANCED DIRECTIVES (Y/N):  N  HEALTH MAINTENANCE: Social History   Tobacco Use   Smoking status: Former Smoker    Last attempt to quit: 05/30/1994    Years since quitting: 24.4   Smokeless tobacco: Never Used  Substance Use Topics   Alcohol use: Yes    Comment: rare   Drug use: No     Colonoscopy:  PAP:  Bone density:  Lipid panel:  Allergies  Allergen Reactions   Amoxicillin Anaphylaxis   Naproxen Hives, Shortness Of Breath, Nausea And Vomiting and Swelling   Penicillins Shortness Of Breath, Swelling and Rash    Has patient had a PCN reaction causing immediate rash, facial/tongue/throat swelling, SOB or lightheadedness with hypotension: Yes Has patient had a PCN reaction causing severe rash involving mucus membranes or skin necrosis: Yes Has patient had a PCN reaction that required hospitalization: Yes Has patient had a PCN reaction occurring within the last 10 years: No  If all of the above answers are "NO", then may proceed with Cephalosporin use.    Codeine Nausea And Vomiting   Levofloxacin Nausea And Vomiting   Shellfish Allergy Hives, Diarrhea, Nausea And Vomiting and Swelling    Current Outpatient  Medications  Medication Sig Dispense Refill   Ascorbic Acid (VITAMIN C) 1000 MG tablet Take 500 mg by mouth daily.      B COMPLEX VITAMINS PO Take 1 tablet by mouth daily. B COMPLEX (Oral Capsule)  1 Every Day for 0 days  Quantity: 0.00;  Refills: 0   Ordered :13-Apr-2010  Celene Kras, MA, Anastasiya ;  Started 29-October-2008 Active Comments: DX: 414.00     Calcium-Vitamin D-Vitamin K 500-100-40 MG-UNT-MCG CHEW Chew 1 tablet by mouth 2 (two) times daily. VIACTIV, 500-100-40 (Oral Tablet Chewable)  1 tablet twice a day for 0 days  Quantity: 0.00;  Refills: 0   Ordered :13-Apr-2010  Celene Kras, MA, Anastasiya ;  Started 29-October-2008 Active Comments: DX: 733.00     clopidogrel (PLAVIX) 75 MG tablet TAKE 1 TABLET BY MOUTH ONCE A DAY 30 tablet 5   Coenzyme Q10 (COQ10) 200 MG CAPS Take 1 capsule by mouth at bedtime.     Cranberry 300 MG tablet Take 300 mg by mouth daily.      dicyclomine (BENTYL) 10 MG capsule      docusate sodium (COLACE) 100 MG capsule Take 100 mg by mouth 2 (two)  times daily.     doxycycline (VIBRAMYCIN) 100 MG capsule      ezetimibe (ZETIA) 10 MG tablet      famotidine (PEPCID) 40 MG tablet TAKE ONE (1) TABLET BY MOUTH EVERY DAY 90 tablet 1   ferrous sulfate 325 (65 FE) MG EC tablet Take 325 mg by mouth daily. FERROUS SULFATE, 325 (65 Fe)MG (Oral Tablet Delayed Release)  1 every day for 0 days  Quantity: 0.00;  Refills: 0   Ordered :13-Apr-2010  Celene Kras, MA, Anastasiya ;  Started 29-October-2008 Active Comments: DX: 285.9     fexofenadine (ALLEGRA) 180 MG tablet Take 180 mg by mouth at bedtime.      fluticasone (FLONASE) 50 MCG/ACT nasal spray USE 2 SPRAYS EACH NOSTRIL DAILY 48 g 1   gabapentin (NEURONTIN) 100 MG capsule Take 1 capsule (100 mg total) by mouth 3 (three) times daily. 90 capsule 3   hydrochlorothiazide (HYDRODIURIL) 25 MG tablet TAKE ONE (1) TABLET EACH DAY 90 tablet 1   hyoscyamine (LEVSIN, ANASPAZ) 0.125 MG tablet Take by mouth.     losartan  (COZAAR) 100 MG tablet TAKE ONE (1) TABLET EACH DAY (Patient not taking: Reported on 10/12/2018) 90 tablet 2   Mesalamine (ASACOL HD) 800 MG TBEC TAKE 2 TABLETS BY MOUTH TWICE A DAY.     methimazole (TAPAZOLE) 5 MG tablet Take 2.5 mg by mouth.      montelukast (SINGULAIR) 10 MG tablet Take 1 tablet (10 mg total) by mouth at bedtime. 90 tablet 1   Multiple Vitamins-Minerals (CENTRUM SILVER PO) Take 1 capsule by mouth daily.     OxyCODONE HCl, Abuse Deter, (OXAYDO) 5 MG TABA Take by mouth.     rosuvastatin (CRESTOR) 20 MG tablet Take 0.5 tablets (10 mg total) by mouth daily. 45 tablet 2   triamcinolone cream (KENALOG) 0.1 %      No current facility-administered medications for this visit.     OBJECTIVE: There were no vitals filed for this visit.   There is no height or weight on file to calculate BMI.    ECOG FS:0 - Asymptomatic  General: Well-developed, well-nourished, no acute distress. Eyes: Pink conjunctiva, anicteric sclera. HEENT: Normocephalic, moist mucous membranes. Lungs: Clear to auscultation bilaterally. Heart: Regular rate and rhythm. No rubs, murmurs, or gallops. Abdomen: Soft, nontender, nondistended. No organomegaly noted, normoactive bowel sounds. Musculoskeletal: Increased swelling and circumference of left thigh. Neuro: Alert, answering all questions appropriately. Cranial nerves grossly intact. Skin: No rashes or petechiae noted. Psych: Normal affect. Lymphatics: No cervical, calvicular, axillary or inguinal LAD.   LAB RESULTS:  Lab Results  Component Value Date   NA 137 06/16/2017   K 3.4 (L) 06/16/2017   CL 104 06/16/2017   CO2 24 06/16/2017   GLUCOSE 145 (H) 06/16/2017   BUN 11 06/16/2017   CREATININE 0.60 05/05/2018   CALCIUM 8.1 (L) 06/16/2017   PROT 6.7 06/28/2016   ALBUMIN 4.4 06/28/2016   AST 17 06/28/2016   ALT 16 06/28/2016   ALKPHOS 65 06/28/2016   BILITOT 0.3 06/28/2016   GFRNONAA >60 06/16/2017   GFRAA >60 06/16/2017    Lab  Results  Component Value Date   WBC 9.1 08/15/2018   NEUTROABS 4.1 06/28/2016   HGB 10.4 (L) 08/15/2018   HCT 32.3 (L) 08/15/2018   MCV 91.0 08/15/2018   PLT 437 (H) 08/15/2018     STUDIES: No results found.  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  PLAN:    1. 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  She has no other evidence of active disease.  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 They are negative for cytokeratin AE1/AE3, EMA, S100, desmin, smooth muscle actin, CK, and CD31.  KI-67 is elevated at 20%.)  Recommendation was to proceed with neoadjuvant XRT which patient wishes to receive in [facility] and has consultation with radiation oncology later today.  She will then undergo surgery at [facility].  It is unclear if patient will require adjuvant chemotherapy at this time.  Return to clinic in 3 months or approximately 2 to 3 weeks after her surgery to discuss her final pathology results and any additional treatment planning necessary.    I spent a total of 30 minutes face-to-face with the patient of which greater than 50% of the visit was spent in counseling and coordination of care as detailed above.   Patient expressed understanding and was in agreement with this plan. She also understands that She can call clinic at any time with any questions, concerns, or complaints.   Cancer Staging No matching staging information was found for the patient.  Lloyd Huger, MD   10/27/2018 1:05 PM

## 2018-10-31 DIAGNOSIS — Z01812 Encounter for preprocedural laboratory examination: Secondary | ICD-10-CM | POA: Diagnosis not present

## 2018-10-31 DIAGNOSIS — C4922 Malignant neoplasm of connective and soft tissue of left lower limb, including hip: Secondary | ICD-10-CM | POA: Diagnosis not present

## 2018-10-31 DIAGNOSIS — M1611 Unilateral primary osteoarthritis, right hip: Secondary | ICD-10-CM | POA: Diagnosis not present

## 2018-10-31 DIAGNOSIS — Z1159 Encounter for screening for other viral diseases: Secondary | ICD-10-CM | POA: Diagnosis not present

## 2018-10-31 DIAGNOSIS — Z4789 Encounter for other orthopedic aftercare: Secondary | ICD-10-CM | POA: Diagnosis not present

## 2018-10-31 DIAGNOSIS — Z96642 Presence of left artificial hip joint: Secondary | ICD-10-CM | POA: Diagnosis not present

## 2018-11-01 ENCOUNTER — Telehealth: Payer: Self-pay | Admitting: Oncology

## 2018-11-01 NOTE — Telephone Encounter (Signed)
Pt called to cancel 6/5 appts with Dr. Grayland Ormond and Dr. Baruch Gouty. Pt didn't understand why she needed to have follow-up her if her surgery was done at [facility]. Per pt, she will call if she needs to be seen in clinic again. Dr. Baruch Gouty team made aware of pt's decision.

## 2018-11-02 DIAGNOSIS — T8130XA Disruption of wound, unspecified, initial encounter: Secondary | ICD-10-CM | POA: Diagnosis not present

## 2018-11-02 DIAGNOSIS — C4922 Malignant neoplasm of connective and soft tissue of left lower limb, including hip: Secondary | ICD-10-CM | POA: Diagnosis not present

## 2018-11-02 DIAGNOSIS — T8131XA Disruption of external operation (surgical) wound, not elsewhere classified, initial encounter: Secondary | ICD-10-CM | POA: Diagnosis not present

## 2018-11-03 ENCOUNTER — Ambulatory Visit: Payer: PPO | Admitting: Oncology

## 2018-11-03 ENCOUNTER — Ambulatory Visit: Payer: PPO | Admitting: Radiation Oncology

## 2018-11-06 DIAGNOSIS — Z955 Presence of coronary angioplasty implant and graft: Secondary | ICD-10-CM | POA: Diagnosis not present

## 2018-11-06 DIAGNOSIS — R9431 Abnormal electrocardiogram [ECG] [EKG]: Secondary | ICD-10-CM | POA: Diagnosis not present

## 2018-11-06 DIAGNOSIS — Z87891 Personal history of nicotine dependence: Secondary | ICD-10-CM | POA: Diagnosis not present

## 2018-11-06 DIAGNOSIS — T84621A Infection and inflammatory reaction due to internal fixation device of left femur, initial encounter: Secondary | ICD-10-CM | POA: Diagnosis not present

## 2018-11-06 DIAGNOSIS — I779 Disorder of arteries and arterioles, unspecified: Secondary | ICD-10-CM | POA: Diagnosis not present

## 2018-11-06 DIAGNOSIS — Z9689 Presence of other specified functional implants: Secondary | ICD-10-CM | POA: Diagnosis not present

## 2018-11-06 DIAGNOSIS — I739 Peripheral vascular disease, unspecified: Secondary | ICD-10-CM | POA: Diagnosis not present

## 2018-11-06 DIAGNOSIS — I252 Old myocardial infarction: Secondary | ICD-10-CM | POA: Diagnosis not present

## 2018-11-06 DIAGNOSIS — Z7902 Long term (current) use of antithrombotics/antiplatelets: Secondary | ICD-10-CM | POA: Diagnosis not present

## 2018-11-06 DIAGNOSIS — C4922 Malignant neoplasm of connective and soft tissue of left lower limb, including hip: Secondary | ICD-10-CM | POA: Diagnosis not present

## 2018-11-06 DIAGNOSIS — I2510 Atherosclerotic heart disease of native coronary artery without angina pectoris: Secondary | ICD-10-CM | POA: Diagnosis not present

## 2018-11-06 DIAGNOSIS — M868X5 Other osteomyelitis, thigh: Secondary | ICD-10-CM | POA: Diagnosis not present

## 2018-11-06 DIAGNOSIS — E785 Hyperlipidemia, unspecified: Secondary | ICD-10-CM | POA: Diagnosis not present

## 2018-11-06 DIAGNOSIS — E0590 Thyrotoxicosis, unspecified without thyrotoxic crisis or storm: Secondary | ICD-10-CM | POA: Diagnosis not present

## 2018-11-06 DIAGNOSIS — I440 Atrioventricular block, first degree: Secondary | ICD-10-CM | POA: Diagnosis not present

## 2018-11-06 DIAGNOSIS — Z85831 Personal history of malignant neoplasm of soft tissue: Secondary | ICD-10-CM | POA: Diagnosis not present

## 2018-11-06 DIAGNOSIS — K219 Gastro-esophageal reflux disease without esophagitis: Secondary | ICD-10-CM | POA: Diagnosis not present

## 2018-11-06 DIAGNOSIS — R55 Syncope and collapse: Secondary | ICD-10-CM | POA: Diagnosis not present

## 2018-11-06 DIAGNOSIS — Z96642 Presence of left artificial hip joint: Secondary | ICD-10-CM | POA: Diagnosis not present

## 2018-11-06 DIAGNOSIS — B957 Other staphylococcus as the cause of diseases classified elsewhere: Secondary | ICD-10-CM | POA: Diagnosis not present

## 2018-11-06 DIAGNOSIS — I350 Nonrheumatic aortic (valve) stenosis: Secondary | ICD-10-CM | POA: Diagnosis not present

## 2018-11-06 DIAGNOSIS — I10 Essential (primary) hypertension: Secondary | ICD-10-CM | POA: Diagnosis not present

## 2018-11-13 DIAGNOSIS — T8149XA Infection following a procedure, other surgical site, initial encounter: Secondary | ICD-10-CM | POA: Diagnosis not present

## 2018-11-13 DIAGNOSIS — Z7689 Persons encountering health services in other specified circumstances: Secondary | ICD-10-CM | POA: Diagnosis not present

## 2018-11-14 DIAGNOSIS — C4922 Malignant neoplasm of connective and soft tissue of left lower limb, including hip: Secondary | ICD-10-CM | POA: Diagnosis not present

## 2018-11-14 DIAGNOSIS — M86052 Acute hematogenous osteomyelitis, left femur: Secondary | ICD-10-CM | POA: Diagnosis not present

## 2018-11-14 DIAGNOSIS — Z96642 Presence of left artificial hip joint: Secondary | ICD-10-CM | POA: Diagnosis not present

## 2018-11-14 DIAGNOSIS — Z792 Long term (current) use of antibiotics: Secondary | ICD-10-CM | POA: Diagnosis not present

## 2018-11-14 DIAGNOSIS — B957 Other staphylococcus as the cause of diseases classified elsewhere: Secondary | ICD-10-CM | POA: Diagnosis not present

## 2018-11-14 DIAGNOSIS — M7989 Other specified soft tissue disorders: Secondary | ICD-10-CM | POA: Diagnosis not present

## 2018-11-14 DIAGNOSIS — Z9889 Other specified postprocedural states: Secondary | ICD-10-CM | POA: Diagnosis not present

## 2018-11-14 DIAGNOSIS — Z79899 Other long term (current) drug therapy: Secondary | ICD-10-CM | POA: Diagnosis not present

## 2018-11-14 DIAGNOSIS — Z09 Encounter for follow-up examination after completed treatment for conditions other than malignant neoplasm: Secondary | ICD-10-CM | POA: Diagnosis not present

## 2018-11-14 DIAGNOSIS — Z4803 Encounter for change or removal of drains: Secondary | ICD-10-CM | POA: Diagnosis not present

## 2018-11-14 DIAGNOSIS — Z967 Presence of other bone and tendon implants: Secondary | ICD-10-CM | POA: Diagnosis not present

## 2018-11-20 ENCOUNTER — Telehealth: Payer: Self-pay | Admitting: Family Medicine

## 2018-11-20 DIAGNOSIS — C4922 Malignant neoplasm of connective and soft tissue of left lower limb, including hip: Secondary | ICD-10-CM | POA: Diagnosis not present

## 2018-11-20 MED ORDER — GABAPENTIN 100 MG PO CAPS: 200.0000 mg | ORAL_CAPSULE | Freq: Three times a day (TID) | ORAL | 3 refills | Status: DC

## 2018-11-20 NOTE — Telephone Encounter (Signed)
Pt needing to talk with someone regarding her shingle pain she is still having. She ran out of her gabapentin (NEURONTIN) 100 MG capsule.  Needing to discuss asap.  Call pt back at [phone].  Thanks, [employer]

## 2018-11-20 NOTE — Telephone Encounter (Signed)
If she is tolerating well she could increase to Gabapentin 247m three times daily if the pain is still significant enough.

## 2018-11-20 NOTE — Telephone Encounter (Signed)
Patient is asking if the Gabapentin should be increase to 261m because of her age and weight? Or just refill the 1052m

## 2018-11-21 DIAGNOSIS — Z4789 Encounter for other orthopedic aftercare: Secondary | ICD-10-CM | POA: Diagnosis not present

## 2018-11-23 DIAGNOSIS — Z483 Aftercare following surgery for neoplasm: Secondary | ICD-10-CM | POA: Diagnosis not present

## 2018-11-27 ENCOUNTER — Telehealth: Payer: Self-pay

## 2018-11-27 ENCOUNTER — Encounter: Payer: Self-pay | Admitting: Family Medicine

## 2018-11-27 ENCOUNTER — Other Ambulatory Visit: Payer: Self-pay

## 2018-11-27 ENCOUNTER — Ambulatory Visit (INDEPENDENT_AMBULATORY_CARE_PROVIDER_SITE_OTHER): Payer: PPO | Admitting: Family Medicine

## 2018-11-27 DIAGNOSIS — I10 Essential (primary) hypertension: Secondary | ICD-10-CM | POA: Diagnosis not present

## 2018-11-27 DIAGNOSIS — E876 Hypokalemia: Secondary | ICD-10-CM | POA: Diagnosis not present

## 2018-11-27 DIAGNOSIS — M869 Osteomyelitis, unspecified: Secondary | ICD-10-CM

## 2018-11-27 DIAGNOSIS — B0229 Other postherpetic nervous system involvement: Secondary | ICD-10-CM

## 2018-11-27 MED ORDER — LOSARTAN POTASSIUM 25 MG PO TABS: 25.0000 mg | ORAL_TABLET | Freq: Every day | ORAL | 2 refills | Status: DC

## 2018-11-27 NOTE — Telephone Encounter (Signed)
Appt today

## 2018-11-27 NOTE — Assessment & Plan Note (Addendum)
Well controlled on home reading Since we are stopping HCTZ, we will resume low-dose losartan at 25 mg daily Patient to monitor home blood pressures and call if blood pressures consistently low, less than 110/70 or high greater than 140/90 consistently Follow-up in about 3 weeks Metabolic panel will be monitored weekly by infectious disease with home draws

## 2018-11-27 NOTE — Progress Notes (Signed)
Patient: Misty Page Female    DOB: [birth year]   78 y.o.   MRN: [id number] Visit Date: 11/30/2018  Today's Provider: Lavon Paganini, MD   Chief Complaint  Patient presents with   Follow up lab results   Hospitalization Follow-up   Subjective:    Virtual Visit via Telephone Note  I connected with Misty Page on 11/30/18 at  4:00 PM EDT by telephone and verified that I am speaking with the correct person using two identifiers.   Patient location: home Provider location: [place] involved in the visit: patient, provider   I discussed the limitations, risks, security and privacy concerns of performing an evaluation and management service by telephone and the availability of in person appointments. I also discussed with the patient that there may be a patient responsible charge related to this service. The patient expressed understanding and agreed to proceed.  HPI Patient states she was told to follow up from lab work that was done at [facility] in [place]. She states the report should have been faxed here. (available in [place])  BP dropped in the hospital and has not gone back up.  BP is running in 110s/70.  Losartan was stopped in hospital and now is only taking HCTZ for her blood pressure.  She was noted to have hypokalemia on labs at [facility].  [facility] 6/8-6/11/20.  She is status post radical resection of soft tissue tumor and proximal femur replacement on 09/25/2018.  She then had subsequent draining wound dehiscence on 11/02/2018.  She went home after that surgery, but her cultures were positive for staph epidermidis.  She denied any fevers or complications with her incision or dressing and her JP drain was in place.  She was admitted to the hospital for IV antibiotics and infectious disease consult for osteomyelitis.  Given that the patient has left femur osteomyelitis complicated by hardware in place growing staph epidermidis,  infectious disease recommended that she continue on daptomycin infusions at home for 6 weeks.  They are following weekly labs.  It was discussed with the patient that she would likely need lifelong consolidative therapy afterward if no further surgery was to take place to remove the hardware.  She continues to have some pain in the site of her shingles.  The gabapentin has helped tremendously, but it is not completely going away.  The gabapentin does make her sleepy and she is worried about having increased sedation with a higher dose    Allergies  Allergen Reactions   Amoxicillin Anaphylaxis   Naproxen Hives, Shortness Of Breath, Nausea And Vomiting and Swelling   Penicillins Shortness Of Breath, Swelling and Rash    Has patient had a PCN reaction causing immediate rash, facial/tongue/throat swelling, SOB or lightheadedness with hypotension: Yes Has patient had a PCN reaction causing severe rash involving mucus membranes or skin necrosis: Yes Has patient had a PCN reaction that required hospitalization: Yes Has patient had a PCN reaction occurring within the last 10 years: No  If all of the above answers are "NO", then may proceed with Cephalosporin use.    Codeine Nausea And Vomiting   Levofloxacin Nausea And Vomiting   Shellfish Allergy Hives, Diarrhea, Nausea And Vomiting and Swelling     Current Outpatient Medications:    Ascorbic Acid (VITAMIN C) 1000 MG tablet, Take 500 mg by mouth daily. , Disp: , Rfl:    B COMPLEX VITAMINS PO, Take 1 tablet by mouth daily. B  COMPLEX (Oral Capsule)  1 Every Day for 0 days  Quantity: 0.00;  Refills: 0   Ordered :13-Apr-2010  Celene Kras, MA, Anastasiya ;  Started 29-October-2008 Active Comments: DX: 414.00, Disp: , Rfl:    Calcium-Vitamin D-Vitamin K 500-100-40 MG-UNT-MCG CHEW, Chew 1 tablet by mouth 2 (two) times daily. VIACTIV, 500-100-40 (Oral Tablet Chewable)  1 tablet twice a day for 0 days  Quantity: 0.00;  Refills: 0   Ordered  :13-Apr-2010  Celene Kras, MA, Anastasiya ;  Started 29-October-2008 Active Comments: DX: 733.00, Disp: , Rfl:    clopidogrel (PLAVIX) 75 MG tablet, TAKE 1 TABLET BY MOUTH ONCE A DAY, Disp: 30 tablet, Rfl: 5   Coenzyme Q10 (COQ10) 200 MG CAPS, Take 1 capsule by mouth at bedtime., Disp: , Rfl:    Cranberry 300 MG tablet, Take 300 mg by mouth daily. , Disp: , Rfl:    dicyclomine (BENTYL) 10 MG capsule, , Disp: , Rfl:    docusate sodium (COLACE) 100 MG capsule, Take 100 mg by mouth 2 (two) times daily., Disp: , Rfl:    doxycycline (VIBRAMYCIN) 100 MG capsule, , Disp: , Rfl:    ezetimibe (ZETIA) 10 MG tablet, , Disp: , Rfl:    famotidine (PEPCID) 40 MG tablet, TAKE ONE (1) TABLET BY MOUTH EVERY DAY, Disp: 90 tablet, Rfl: 1   ferrous sulfate 325 (65 FE) MG EC tablet, Take 325 mg by mouth daily. FERROUS SULFATE, 325 (65 Fe)MG (Oral Tablet Delayed Release)  1 every day for 0 days  Quantity: 0.00;  Refills: 0   Ordered :13-Apr-2010  Celene Kras, MA, Anastasiya ;  Started 29-October-2008 Active Comments: DX: 285.9, Disp: , Rfl:    fexofenadine (ALLEGRA) 180 MG tablet, Take 180 mg by mouth at bedtime. , Disp: , Rfl:    fluticasone (FLONASE) 50 MCG/ACT nasal spray, USE 2 SPRAYS EACH NOSTRIL DAILY, Disp: 48 g, Rfl: 1   gabapentin (NEURONTIN) 100 MG capsule, Take 2 capsules (200 mg total) by mouth 3 (three) times daily., Disp: 90 capsule, Rfl: 3   hyoscyamine (LEVSIN, ANASPAZ) 0.125 MG tablet, Take by mouth., Disp: , Rfl:    Mesalamine (ASACOL HD) 800 MG TBEC, TAKE 2 TABLETS BY MOUTH TWICE A DAY., Disp: , Rfl:    methimazole (TAPAZOLE) 5 MG tablet, Take 2.5 mg by mouth. , Disp: , Rfl:    montelukast (SINGULAIR) 10 MG tablet, Take 1 tablet (10 mg total) by mouth at bedtime., Disp: 90 tablet, Rfl: 1   Multiple Vitamins-Minerals (CENTRUM SILVER PO), Take 1 capsule by mouth daily., Disp: , Rfl:    OxyCODONE HCl, Abuse Deter, (OXAYDO) 5 MG TABA, Take by mouth., Disp: , Rfl:    rosuvastatin (CRESTOR) 20  MG tablet, Take 0.5 tablets (10 mg total) by mouth daily., Disp: 45 tablet, Rfl: 2   triamcinolone cream (KENALOG) 0.1 %, , Disp: , Rfl:    losartan (COZAAR) 25 MG tablet, Take 1 tablet (25 mg total) by mouth daily. TAKE ONE (1) TABLET EACH DAY, Disp: 30 tablet, Rfl: 2  Review of Systems  Constitutional: Negative.   Respiratory: Negative.   Cardiovascular: Negative.   Musculoskeletal: Negative.     Social History   Tobacco Use   Smoking status: Former Smoker    Quit date: 05/30/1994    Years since quitting: 24.5   Smokeless tobacco: Never Used  Substance Use Topics   Alcohol use: Yes    Comment: rare      Objective:   There were no vitals taken for this visit.  There were no vitals filed for this visit.   Physical Exam   No results found for any visits on 11/27/18.     Assessment & Plan     I discussed the assessment and treatment plan with the patient. The patient was provided an opportunity to ask questions and all were answered. The patient agreed with the plan and demonstrated an understanding of the instructions.   The patient was advised to call back or seek an in-person evaluation if the symptoms worsen or if the condition fails to improve as anticipated.  Problem List Items Addressed This Visit      Cardiovascular and Mediastinum   Essential (primary) hypertension    Well controlled on home reading Since we are stopping HCTZ, we will resume low-dose losartan at 25 mg daily Patient to monitor home blood pressures and call if blood pressures consistently low, less than 110/70 or high greater than 140/90 consistently Follow-up in about 3 weeks Metabolic panel will be monitored weekly by infectious disease with home draws      Relevant Medications   losartan (COZAAR) 25 MG tablet     Nervous and Auditory   Post herpetic neuralgia    Recent shingles infection Continues to have some neuralgia We will continue gabapentin We discussed increasing the  dose, but she is concerned about worsening sedation which is reasonable As this is manageable at this time, we will not make any changes        Musculoskeletal and Integument   Osteomyelitis of left femur (HCC)    Status post removal of sarcoma and placement of femur prosthetic She then developed osteomyelitis with hardware in place caused by staph epidermidis She is being treated by [facility] infectious disease She is on daptomycin home therapy for 6 weeks She is having labs monitored weekly        Other   Hypokalemia - Primary    New finding Likely related to her HCTZ Also discussed that losartan can occasionally raise potassium and so the combination of stopping that and continuing the HCTZ likely contributed to her hypokalemia Discussed that there are other possible causes of this such as mineralocorticoid excess, and she is already planning to follow-up with her endocrinologist to discuss this We will stop HCTZ and monitor potassium Advised on potassium rich diet If potassium is not increasing at next check, we will give potassium supplement temporarily          Return in about 3 weeks (around 12/18/2018) for BP and hypokalemia f/u.   The entirety of the information documented in the History of Present Illness, Review of Systems and Physical Exam were personally obtained by me. Portions of this information were initially documented by Tiburcio Pea, CMA and reviewed by me for thoroughness and accuracy.    Jozee Hammer, Dionne Bucy, MD MPH [facility]

## 2018-11-28 DIAGNOSIS — Z483 Aftercare following surgery for neoplasm: Secondary | ICD-10-CM | POA: Diagnosis not present

## 2018-11-30 DIAGNOSIS — M869 Osteomyelitis, unspecified: Secondary | ICD-10-CM | POA: Insufficient documentation

## 2018-11-30 HISTORY — DX: Osteomyelitis, unspecified: M86.9

## 2018-11-30 NOTE — Assessment & Plan Note (Signed)
Recent shingles infection Continues to have some neuralgia We will continue gabapentin We discussed increasing the dose, but she is concerned about worsening sedation which is reasonable As this is manageable at this time, we will not make any changes

## 2018-11-30 NOTE — Assessment & Plan Note (Signed)
New finding Likely related to her HCTZ Also discussed that losartan can occasionally raise potassium and so the combination of stopping that and continuing the HCTZ likely contributed to her hypokalemia Discussed that there are other possible causes of this such as mineralocorticoid excess, and she is already planning to follow-up with her endocrinologist to discuss this We will stop HCTZ and monitor potassium Advised on potassium rich diet If potassium is not increasing at next check, we will give potassium supplement temporarily

## 2018-11-30 NOTE — Assessment & Plan Note (Signed)
Status post removal of sarcoma and placement of femur prosthetic She then developed osteomyelitis with hardware in place caused by staph epidermidis She is being treated by [facility] infectious disease She is on daptomycin home therapy for 6 weeks She is having labs monitored weekly

## 2018-12-03 DIAGNOSIS — Z483 Aftercare following surgery for neoplasm: Secondary | ICD-10-CM | POA: Diagnosis not present

## 2018-12-03 DIAGNOSIS — C4922 Malignant neoplasm of connective and soft tissue of left lower limb, including hip: Secondary | ICD-10-CM | POA: Diagnosis not present

## 2018-12-05 DIAGNOSIS — T8189XA Other complications of procedures, not elsewhere classified, initial encounter: Secondary | ICD-10-CM | POA: Diagnosis not present

## 2018-12-05 DIAGNOSIS — Z4789 Encounter for other orthopedic aftercare: Secondary | ICD-10-CM | POA: Diagnosis not present

## 2018-12-08 DIAGNOSIS — Z483 Aftercare following surgery for neoplasm: Secondary | ICD-10-CM | POA: Diagnosis not present

## 2018-12-08 DIAGNOSIS — C4922 Malignant neoplasm of connective and soft tissue of left lower limb, including hip: Secondary | ICD-10-CM | POA: Diagnosis not present

## 2018-12-10 DIAGNOSIS — Z483 Aftercare following surgery for neoplasm: Secondary | ICD-10-CM | POA: Diagnosis not present

## 2018-12-10 DIAGNOSIS — C4922 Malignant neoplasm of connective and soft tissue of left lower limb, including hip: Secondary | ICD-10-CM | POA: Diagnosis not present

## 2018-12-14 ENCOUNTER — Ambulatory Visit (INDEPENDENT_AMBULATORY_CARE_PROVIDER_SITE_OTHER): Payer: PPO | Admitting: Family Medicine

## 2018-12-14 ENCOUNTER — Other Ambulatory Visit: Payer: Self-pay

## 2018-12-14 ENCOUNTER — Encounter: Payer: Self-pay | Admitting: Family Medicine

## 2018-12-14 VITALS — BP 110/70 | HR 90

## 2018-12-14 DIAGNOSIS — I10 Essential (primary) hypertension: Secondary | ICD-10-CM

## 2018-12-14 DIAGNOSIS — E876 Hypokalemia: Secondary | ICD-10-CM

## 2018-12-14 NOTE — Assessment & Plan Note (Signed)
Now resolved Seems to be related to the HCTZ, which we have discontinued It is holding stable without potassium supplements on losartan Continue to monitor

## 2018-12-14 NOTE — Assessment & Plan Note (Signed)
Well-controlled on home readings Continue losartan 25 mg daily Advised to monitor home blood pressures and call if blood pressures are consistently low, less than 110/70 or higher, greater than 140/90 consistently Reviewed last labs from [facility] from last week that show stable creatinine and improved potassium Follow-up at CPE 6 months

## 2018-12-14 NOTE — Progress Notes (Signed)
Patient: Misty Page Female    DOB: 10-Dec-1940   78 y.o.   MRN: [id number] Visit Date: 12/14/2018  Today's Provider: Lavon Paganini, MD   Chief Complaint  Patient presents with   Hypertension   Subjective:    I, Misty Page CMA, am acting as a scribe for Lavon Paganini, MD.   Virtual Visit via Telephone Note  I connected with Misty Page on 12/14/18 at  2:40 PM EDT by telephone and verified that I am speaking with the correct person using two identifiers.   Patient location: home Provider location: [place] involved in the visit: patient, provider   I discussed the limitations, risks, security and privacy concerns of performing an evaluation and management service by telephone and the availability of in person appointments. I also discussed with the patient that there may be a patient responsible charge related to this service. The patient expressed understanding and agreed to proceed.  HPI  Hypertension, follow-up:  BP Readings from Last 3 Encounters:  12/14/18 (!) 100/59  09/22/18 133/77  06/29/18 115/67    She was last seen for hypertension 17 days ago.  BP at that visit was well controlled. Management changes since that visit include: stopped HCTZ and started low-dose Losartan 25 mg daily.. This change was made due to her hypokalemia She reports good compliance with treatment. She is not having side effects.  She is not exercising. She is adherent to low salt diet.   Outside blood pressures are 110/70's or 122/70's. She is experiencing none.  Patient denies chest pain, chest pressure/discomfort, exertional chest pressure/discomfort, fatigue, irregular heart beat, palpitations and tachypnea.   Cardiovascular risk factors include advanced age (older than 23 for men, 11 for women) and hypertension.  Use of agents associated with hypertension: none.     Weight trend: stable Wt Readings from Last 3 Encounters:  09/22/18  137 lb 2 oz (62.2 kg)  06/29/18 137 lb (62.1 kg)  06/28/18 136 lb 9.6 oz (62 kg)    Current diet: well balanced  ------------------------------------------------------------------------   Allergies  Allergen Reactions   Amoxicillin Anaphylaxis   Naproxen Hives, Shortness Of Breath, Nausea And Vomiting and Swelling   Penicillins Shortness Of Breath, Swelling and Rash    Has patient had a PCN reaction causing immediate rash, facial/tongue/throat swelling, SOB or lightheadedness with hypotension: Yes Has patient had a PCN reaction causing severe rash involving mucus membranes or skin necrosis: Yes Has patient had a PCN reaction that required hospitalization: Yes Has patient had a PCN reaction occurring within the last 10 years: No  If all of the above answers are "NO", then may proceed with Cephalosporin use.    Codeine Nausea And Vomiting   Levofloxacin Nausea And Vomiting   Shellfish Allergy Hives, Diarrhea, Nausea And Vomiting and Swelling     Current Outpatient Medications:    Ascorbic Acid (VITAMIN C) 1000 MG tablet, Take 500 mg by mouth daily. , Disp: , Rfl:    B COMPLEX VITAMINS PO, Take 1 tablet by mouth daily. B COMPLEX (Oral Capsule)  1 Every Day for 0 days  Quantity: 0.00;  Refills: 0   Ordered :13-Apr-2010  Celene Kras, MA, Anastasiya ;  Started 29-October-2008 Active Comments: DX: 414.00, Disp: , Rfl:    Calcium-Vitamin D-Vitamin K 500-100-40 MG-UNT-MCG CHEW, Chew 1 tablet by mouth 2 (two) times daily. VIACTIV, 500-100-40 (Oral Tablet Chewable)  1 tablet twice a day for 0 days  Quantity: 0.00;  Refills: 0  Ordered :13-Apr-2010  Johnette Abraham ;  Started 29-October-2008 Active Comments: DX: 733.00, Disp: , Rfl:    clopidogrel (PLAVIX) 75 MG tablet, TAKE 1 TABLET BY MOUTH ONCE A DAY, Disp: 30 tablet, Rfl: 5   Coenzyme Q10 (COQ10) 200 MG CAPS, Take 1 capsule by mouth at bedtime., Disp: , Rfl:    Cranberry 300 MG tablet, Take 300 mg by mouth daily. , Disp: ,  Rfl:    dicyclomine (BENTYL) 10 MG capsule, , Disp: , Rfl:    docusate sodium (COLACE) 100 MG capsule, Take 100 mg by mouth 2 (two) times daily., Disp: , Rfl:    doxycycline (VIBRAMYCIN) 100 MG capsule, , Disp: , Rfl:    ezetimibe (ZETIA) 10 MG tablet, , Disp: , Rfl:    famotidine (PEPCID) 40 MG tablet, TAKE ONE (1) TABLET BY MOUTH EVERY DAY, Disp: 90 tablet, Rfl: 1   ferrous sulfate 325 (65 FE) MG EC tablet, Take 325 mg by mouth daily. FERROUS SULFATE, 325 (65 Fe)MG (Oral Tablet Delayed Release)  1 every day for 0 days  Quantity: 0.00;  Refills: 0   Ordered :13-Apr-2010  Celene Kras, MA, Anastasiya ;  Started 29-October-2008 Active Comments: DX: 285.9, Disp: , Rfl:    fexofenadine (ALLEGRA) 180 MG tablet, Take 180 mg by mouth at bedtime. , Disp: , Rfl:    fluticasone (FLONASE) 50 MCG/ACT nasal spray, USE 2 SPRAYS EACH NOSTRIL DAILY, Disp: 48 g, Rfl: 1   gabapentin (NEURONTIN) 100 MG capsule, Take 2 capsules (200 mg total) by mouth 3 (three) times daily., Disp: 90 capsule, Rfl: 3   hyoscyamine (LEVSIN, ANASPAZ) 0.125 MG tablet, Take by mouth., Disp: , Rfl:    losartan (COZAAR) 25 MG tablet, Take 1 tablet (25 mg total) by mouth daily. TAKE ONE (1) TABLET EACH DAY, Disp: 30 tablet, Rfl: 2   Mesalamine (ASACOL HD) 800 MG TBEC, TAKE 2 TABLETS BY MOUTH TWICE A DAY., Disp: , Rfl:    methimazole (TAPAZOLE) 5 MG tablet, Take 2.5 mg by mouth. , Disp: , Rfl:    montelukast (SINGULAIR) 10 MG tablet, Take 1 tablet (10 mg total) by mouth at bedtime., Disp: 90 tablet, Rfl: 1   Multiple Vitamins-Minerals (CENTRUM SILVER PO), Take 1 capsule by mouth daily., Disp: , Rfl:    OxyCODONE HCl, Abuse Deter, (OXAYDO) 5 MG TABA, Take by mouth., Disp: , Rfl:    rosuvastatin (CRESTOR) 20 MG tablet, Take 0.5 tablets (10 mg total) by mouth daily., Disp: 45 tablet, Rfl: 2   triamcinolone cream (KENALOG) 0.1 %, , Disp: , Rfl:   Review of Systems  Social History   Tobacco Use   Smoking status: Former Smoker     Quit date: 05/30/1994    Years since quitting: 24.5   Smokeless tobacco: Never Used  Substance Use Topics   Alcohol use: Yes    Comment: rare      Objective:   BP (!) 100/59 (BP Location: Left Arm, Patient Position: Sitting, Cuff Size: Normal)    Pulse 90  Vitals:   12/14/18 1336  BP: (!) 100/59  Pulse: 90     Physical Exam   No results found for any visits on 12/14/18.     Assessment & Plan    I discussed the assessment and treatment plan with the patient. The patient was provided an opportunity to ask questions and all were answered. The patient agreed with the plan and demonstrated an understanding of the instructions.   The patient was advised to call  back or seek an in-person evaluation if the symptoms worsen or if the condition fails to improve as anticipated.   Problem List Items Addressed This Visit      Cardiovascular and Mediastinum   Essential (primary) hypertension - Primary    Well-controlled on home readings Continue losartan 25 mg daily Advised to monitor home blood pressures and call if blood pressures are consistently low, less than 110/70 or higher, greater than 140/90 consistently Reviewed last labs from [facility] from last week that show stable creatinine and improved potassium Follow-up at CPE 6 months        Other   Hypokalemia    Now resolved Seems to be related to the HCTZ, which we have discontinued It is holding stable without potassium supplements on losartan Continue to monitor          Return in about 6 months (around 06/16/2019) for CPE/AWV.   The entirety of the information documented in the History of Present Illness, Review of Systems and Physical Exam were personally obtained by me. Portions of this information were initially documented by [facility], CMA and reviewed by me for thoroughness and accuracy.    Martice Doty, Dionne Bucy, MD MPH [facility]

## 2018-12-18 DIAGNOSIS — Z483 Aftercare following surgery for neoplasm: Secondary | ICD-10-CM | POA: Diagnosis not present

## 2018-12-19 DIAGNOSIS — Z978 Presence of other specified devices: Secondary | ICD-10-CM | POA: Diagnosis not present

## 2018-12-19 DIAGNOSIS — Z881 Allergy status to other antibiotic agents status: Secondary | ICD-10-CM | POA: Diagnosis not present

## 2018-12-19 DIAGNOSIS — Z969 Presence of functional implant, unspecified: Secondary | ICD-10-CM | POA: Diagnosis not present

## 2018-12-19 DIAGNOSIS — C4922 Malignant neoplasm of connective and soft tissue of left lower limb, including hip: Secondary | ICD-10-CM | POA: Diagnosis not present

## 2018-12-19 DIAGNOSIS — Z792 Long term (current) use of antibiotics: Secondary | ICD-10-CM | POA: Diagnosis not present

## 2018-12-19 DIAGNOSIS — M86152 Other acute osteomyelitis, left femur: Secondary | ICD-10-CM | POA: Diagnosis not present

## 2018-12-19 DIAGNOSIS — L270 Generalized skin eruption due to drugs and medicaments taken internally: Secondary | ICD-10-CM | POA: Diagnosis not present

## 2018-12-19 DIAGNOSIS — B957 Other staphylococcus as the cause of diseases classified elsewhere: Secondary | ICD-10-CM | POA: Diagnosis not present

## 2018-12-19 DIAGNOSIS — T366X5D Adverse effect of rifampicins, subsequent encounter: Secondary | ICD-10-CM | POA: Diagnosis not present

## 2018-12-19 DIAGNOSIS — Z96642 Presence of left artificial hip joint: Secondary | ICD-10-CM | POA: Diagnosis not present

## 2018-12-19 DIAGNOSIS — Z9889 Other specified postprocedural states: Secondary | ICD-10-CM | POA: Diagnosis not present

## 2018-12-26 ENCOUNTER — Other Ambulatory Visit: Payer: Self-pay | Admitting: Physician Assistant

## 2018-12-26 DIAGNOSIS — Z96642 Presence of left artificial hip joint: Secondary | ICD-10-CM | POA: Diagnosis not present

## 2018-12-26 DIAGNOSIS — K219 Gastro-esophageal reflux disease without esophagitis: Secondary | ICD-10-CM

## 2018-12-26 DIAGNOSIS — C7652 Malignant neoplasm of left lower limb: Secondary | ICD-10-CM | POA: Diagnosis not present

## 2018-12-26 DIAGNOSIS — Z452 Encounter for adjustment and management of vascular access device: Secondary | ICD-10-CM | POA: Diagnosis not present

## 2018-12-26 DIAGNOSIS — Z471 Aftercare following joint replacement surgery: Secondary | ICD-10-CM | POA: Diagnosis not present

## 2018-12-26 DIAGNOSIS — Z792 Long term (current) use of antibiotics: Secondary | ICD-10-CM | POA: Diagnosis not present

## 2019-01-09 DIAGNOSIS — Z4789 Encounter for other orthopedic aftercare: Secondary | ICD-10-CM | POA: Diagnosis not present

## 2019-01-09 DIAGNOSIS — Z792 Long term (current) use of antibiotics: Secondary | ICD-10-CM | POA: Diagnosis not present

## 2019-01-09 DIAGNOSIS — C4922 Malignant neoplasm of connective and soft tissue of left lower limb, including hip: Secondary | ICD-10-CM | POA: Diagnosis not present

## 2019-01-09 DIAGNOSIS — Z4801 Encounter for change or removal of surgical wound dressing: Secondary | ICD-10-CM | POA: Diagnosis not present

## 2019-01-12 DIAGNOSIS — M75111 Incomplete rotator cuff tear or rupture of right shoulder, not specified as traumatic: Secondary | ICD-10-CM | POA: Diagnosis not present

## 2019-01-12 DIAGNOSIS — M7581 Other shoulder lesions, right shoulder: Secondary | ICD-10-CM | POA: Diagnosis not present

## 2019-01-16 ENCOUNTER — Other Ambulatory Visit: Payer: Self-pay | Admitting: Physician Assistant

## 2019-01-16 DIAGNOSIS — C7652 Malignant neoplasm of left lower limb: Secondary | ICD-10-CM | POA: Diagnosis not present

## 2019-01-16 DIAGNOSIS — Z01812 Encounter for preprocedural laboratory examination: Secondary | ICD-10-CM | POA: Diagnosis not present

## 2019-01-16 DIAGNOSIS — C9000 Multiple myeloma not having achieved remission: Secondary | ICD-10-CM | POA: Diagnosis not present

## 2019-01-16 DIAGNOSIS — T8141XD Infection following a procedure, superficial incisional surgical site, subsequent encounter: Secondary | ICD-10-CM | POA: Diagnosis not present

## 2019-01-16 DIAGNOSIS — C4922 Malignant neoplasm of connective and soft tissue of left lower limb, including hip: Secondary | ICD-10-CM | POA: Diagnosis not present

## 2019-01-16 DIAGNOSIS — Z20828 Contact with and (suspected) exposure to other viral communicable diseases: Secondary | ICD-10-CM | POA: Diagnosis not present

## 2019-01-16 DIAGNOSIS — T8131XD Disruption of external operation (surgical) wound, not elsewhere classified, subsequent encounter: Secondary | ICD-10-CM | POA: Diagnosis not present

## 2019-01-16 DIAGNOSIS — D649 Anemia, unspecified: Secondary | ICD-10-CM | POA: Diagnosis not present

## 2019-01-16 MED ORDER — FAMOTIDINE 20 MG PO TABS: 20.0000 mg | ORAL_TABLET | Freq: Two times a day (BID) | ORAL | 1 refills | Status: DC

## 2019-01-16 NOTE — Progress Notes (Signed)
Changed famotidine 46m to famotidine 278mBID due to availability.

## 2019-01-22 DIAGNOSIS — T8131XD Disruption of external operation (surgical) wound, not elsewhere classified, subsequent encounter: Secondary | ICD-10-CM | POA: Diagnosis not present

## 2019-01-22 DIAGNOSIS — Z96652 Presence of left artificial knee joint: Secondary | ICD-10-CM | POA: Diagnosis not present

## 2019-01-25 IMAGING — CT CT ANGIO AOBIFEM WO/W CM
2 of 12 series · 8 of 46 positions shown, 13 images · IV contrast (iopamidol)
Comparison: 06/17/2010, 07/29/2009, 07/14/2009

CLINICAL DATA: 77-year-old female with a history of lateral thigh
swelling

EXAM:
CT ANGIOGRAPHY OF ABDOMINAL AORTA WITH ILIOFEMORAL RUNOFF
TECHNIQUE: Multidetector CT imaging of the abdomen, pelvis and lower
extremities was performed using the standard protocol during bolus
administration of intravenous contrast. Multiplanar CT image
reconstructions and MIPs were obtained to evaluate the vascular
anatomy.
CONTRAST:  100mL 787NA1-NAI IOPAMIDOL (787NA1-NAI) INJECTION 76%

[Series 46: mpr lower body cta whole body · axial · 0.61mm/px · z∈[+763,+1411]mm · 7 of 417 slices shown, 12 images (1 of 2)]
[im 47/417  soft-tissue]
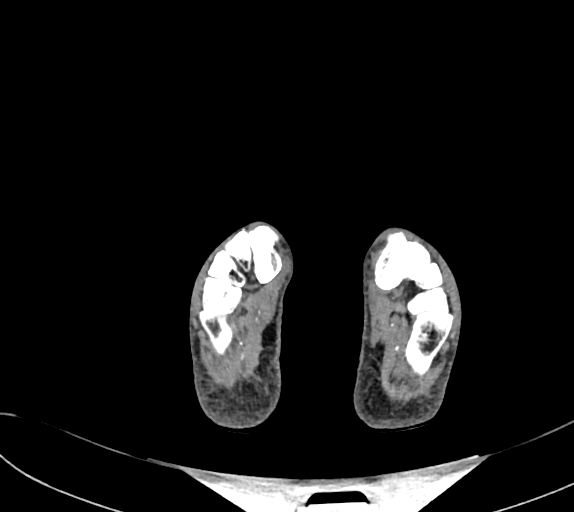
[im 47/417  bone]
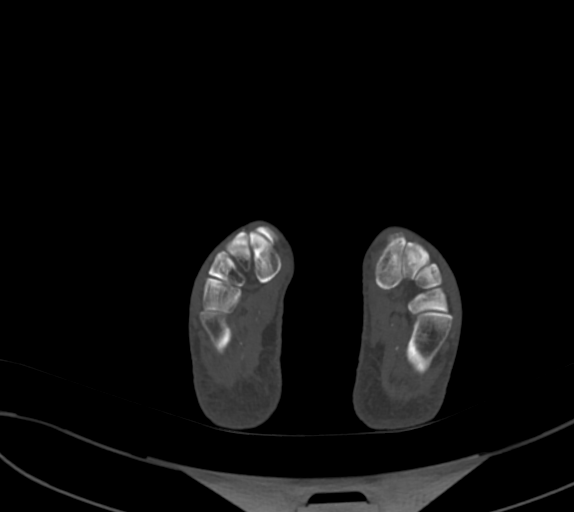
[im 93/417  soft-tissue]
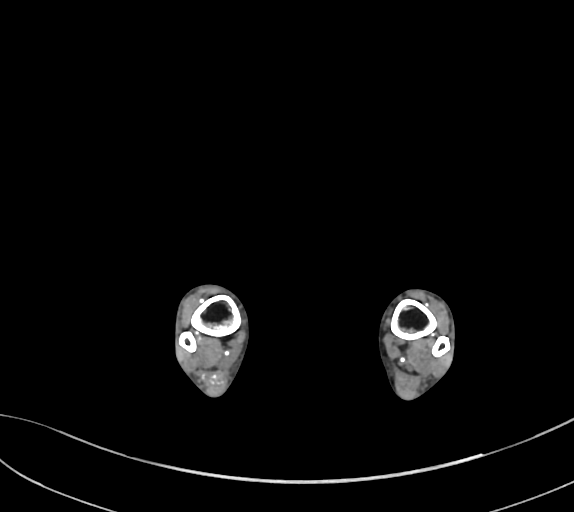
[im 139/417  soft-tissue]
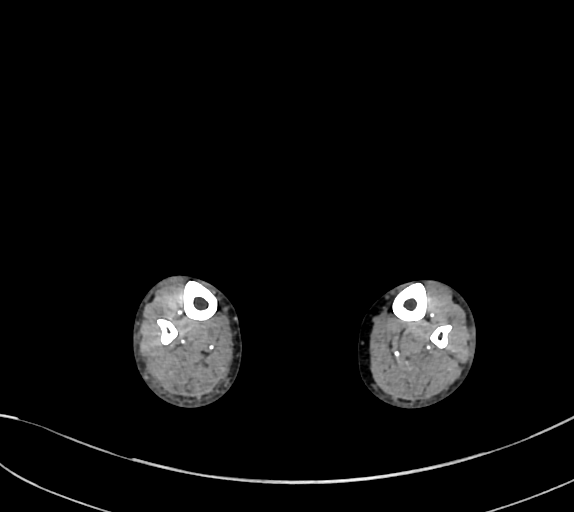
[im 232/417  soft-tissue]
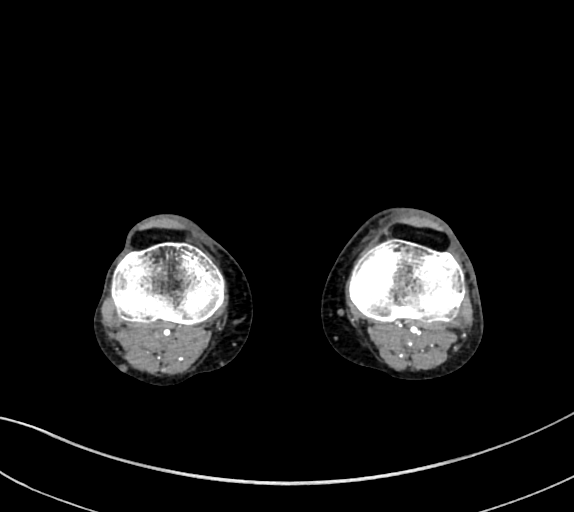
[im 232/417  lung]
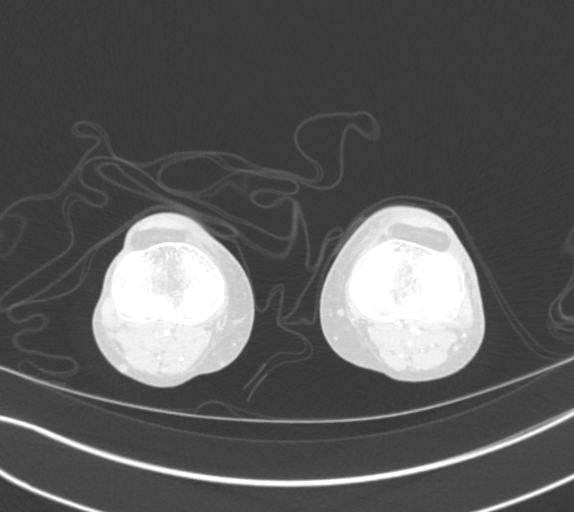
[im 278/417  soft-tissue]
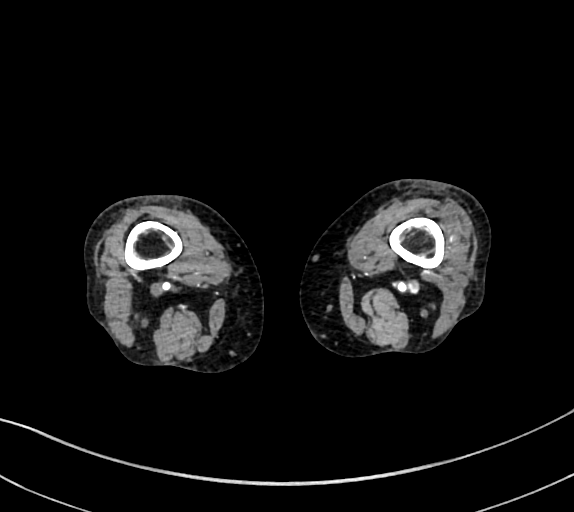
[im 278/417  lung]
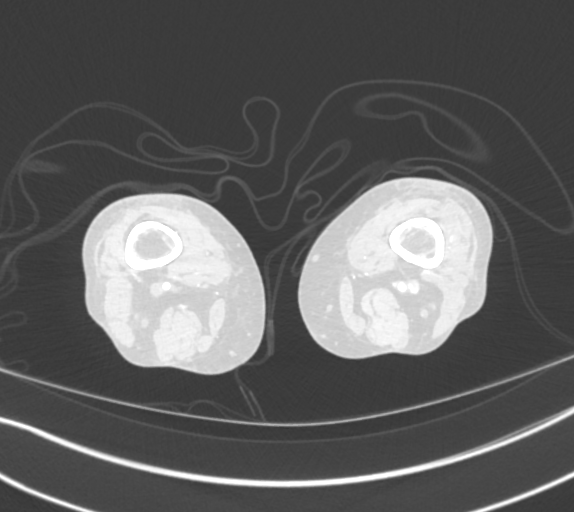
[im 324/417  soft-tissue]
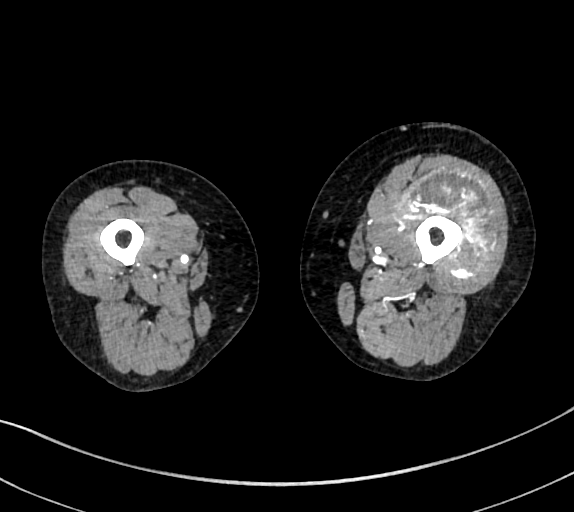
[im 324/417  lung]
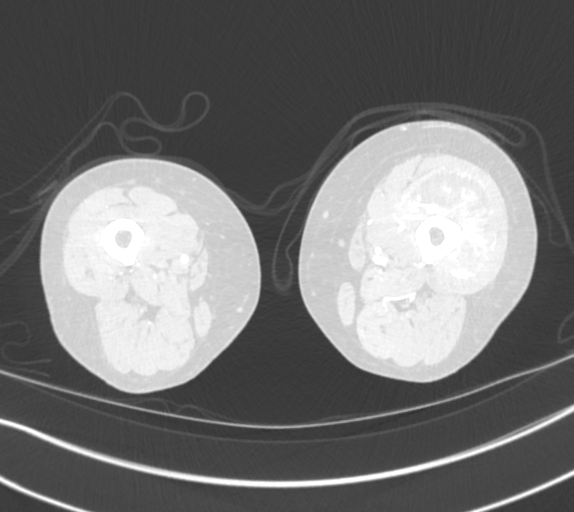
[im 370/417  soft-tissue]
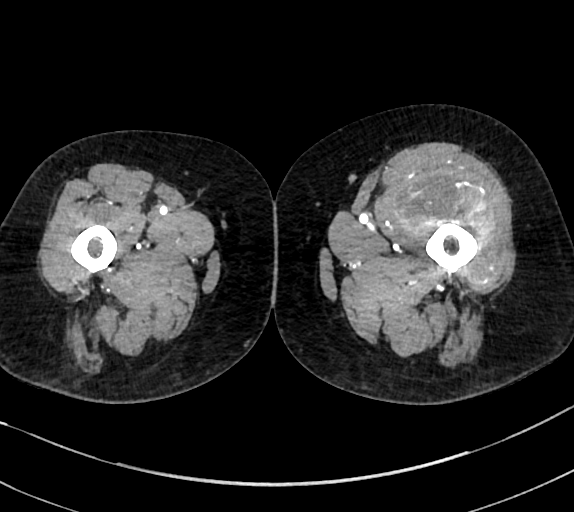
[im 370/417  lung]
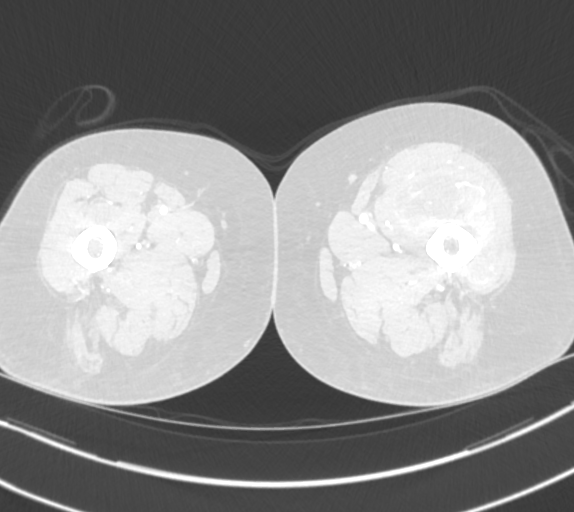

[Series 48: mpr lower body cta whole body · coronal · 0.68mm/px · 1 of 153 slices shown (2 of 2)]
[im 77/153  soft-tissue]
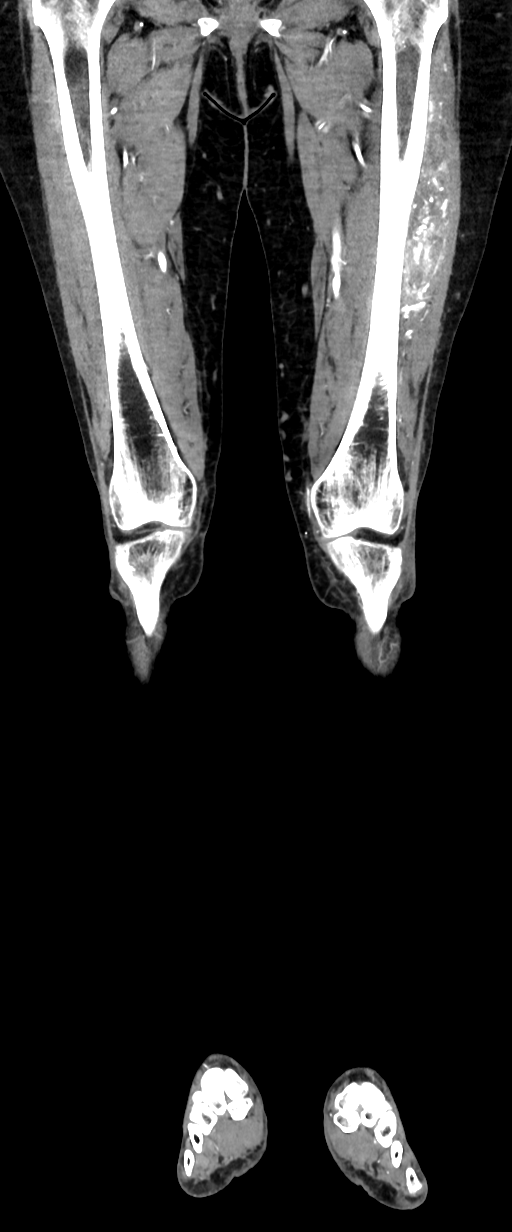

[8 of 46 positions shown; findings below may reference images not displayed]

FINDINGS: VASCULAR

Aorta: Mixed calcified and soft plaque of the abdominal aorta, mild
to moderate severity. No dissection. Stent of the infrarenal
abdominal aorta, with telescoping iliac/kissing iliac stents to the
common iliac arteries.

Celiac: Advanced atherosclerosis of the celiac artery origin, with
what appears to be high-grade stenosis on both axial and sagittal
images.

SMA: Moderate atherosclerotic changes at the origin of the superior
mesenteric artery with no evidence of high-grade stenosis.

Renals: Atherosclerotic changes at the origin of the left renal
artery with stent in place. Stent remains patent.

Moderate atherosclerotic changes at the origin of the right renal
artery with estimated 50% narrowing.

IMA: The IMA has been covered by stent and appears patent via
collateral flow.

Right lower extremity:

Patent right iliac stent, terminating beyond the hypogastric artery
origin. Hypogastric artery remains patent.

External iliac artery patent with no significant atherosclerosis.

Mild atherosclerosis of the common femoral artery.

Profunda femoris patent, with patent thigh branches.

Moderate atherosclerosis of the superficial femoral artery, most
pronounced in the adductor canal. No high-grade stenosis or
occlusion identified.

Moderate popliteal atherosclerosis, with patency maintained.

Origin of the anterior tibial artery is patent, with the AT
appearing patent to the ankle.

Tibioperoneal trunk patent with atherosclerotic changes.

Peroneal artery and posterior tibial artery appear patent to the
ankle.

Left lower extremity:

Patent left iliac stent terminating in the common iliac artery above
the bifurcation. Hypogastric artery appears patent, though non
stenotic at the origin.

External iliac artery patent without significant atherosclerosis.

Common femoral artery with mild atherosclerotic changes.

Profunda femoris patent with patent thigh branches.

Moderate atherosclerotic changes of the superficial femoral artery
without occlusion. Calcifications through the adductor canal.

Popliteal artery with moderate to advanced atherosclerotic changes.
Popliteal artery appears patent though potentially 50% narrowed at
the joint.

Anterior tibial artery patent proximally and occluded distally.

Tibioperoneal trunk patent with moderate atherosclerotic changes.

Posterior tibial artery patent from the origin to the ankle.

Peroneal artery patent from the origin to the ankle.

Significant arterial contribution to left thigh soft tissue mass
from the profunda femoris branches and inferiorly from the
geniculate branches.

Early venous shunting through the soft tissue mass.

Veins: Unremarkable appearance of the venous system.

Review of the MIP images confirms the above findings.

NON-VASCULAR

Lower chest: No acute.

Hepatobiliary: Unremarkable appearance of the liver. Unremarkable
gall bladder.

Pancreas: Unremarkable pancreas

Spleen: Unremarkable spleen

Adrenals/Urinary Tract: Unremarkable appearance of adrenal glands.

Right:

No hydronephrosis. Symmetric perfusion to the left. No
nephrolithiasis. Unremarkable course of the right ureter.

Left:

No hydronephrosis. Symmetric perfusion to the right. Nonobstructive
stone at the inferior collecting system of the left kidney.
Unremarkable course of the left ureter. Cyst of the inferior aspect
left kidney measures 3.2 cm.

Unremarkable appearance of the urinary bladder .

Stomach/Bowel: Unremarkable appearance of the stomach. Unremarkable
appearance of small bowel. No evidence of obstruction. Colonic
diverticula without evidence of acute diverticulitis. Normal
appendix

Lymphatic: Multiple lymph nodes in the para-aortic nodal station,
none of which are enlarged.

Mesenteric: No free fluid or air. No adenopathy.

Reproductive: Hysterectomy

Other: No hernia.

Musculoskeletal: Vascular mass of the left quadriceps, measuring
16.7 cm cranial caudal dimension, 7.7 cm AP, 6.0 cm left-to-right in
the mid thigh. This appears to involve the vastus intermedius,
potentially vastus lateralis. Hypervascular flow. Near
circumferential growth about the femur. No periosteal reaction or
erosive changes on the femur.

Degenerative changes of the spine including vacuum disc phenomenon
at L5-S1.
IMPRESSION: Left thigh swelling is secondary to hypervascular mass of the deep
quadriceps musculature, most likely soft tissue sarcoma. These
results were called by telephone at the time of interpretation on
05/05/2018 at [DATE] to Dr. Lashonda Freedman PA, Ms Fallon Jim.

Multilevel atherosclerosis, including:

-aortic atherosclerosis, status post treatment for aortoiliac
disease with lower aortic stent with telescoping iliac kissing
stents.

-moderate right femoropopliteal disease with calcified plaque of the
SFA in the abductor canal, calcified plaque of the popliteal artery,
both patent.

-mild right tibial disease, with all 3 arteries patent to the ankle

-moderate left femoropopliteal disease, with calcified disease in
the adductor canal, and estimated 50% narrowing of the popliteal
artery at the knee joint

-left tibial disease, with apparent distal occlusion of the anterior
tibial artery and patency of peroneal and posterior tibial artery to
the ankle.

Mesenteric arterial disease with occlusion of the IMA at the origin
status post stenting, high-grade stenosis of the celiac artery, and
developing atherosclerosis of the SMA origin.

Bilateral renal artery disease, with estimated 50% narrowing on the
right, and patent stent on the left.

Diverticular disease without evidence of acute diverticulitis.

## 2019-01-30 DIAGNOSIS — Z4801 Encounter for change or removal of surgical wound dressing: Secondary | ICD-10-CM | POA: Diagnosis not present

## 2019-02-05 DIAGNOSIS — D62 Acute posthemorrhagic anemia: Secondary | ICD-10-CM | POA: Diagnosis not present

## 2019-02-05 DIAGNOSIS — T8132XD Disruption of internal operation (surgical) wound, not elsewhere classified, subsequent encounter: Secondary | ICD-10-CM | POA: Diagnosis not present

## 2019-02-05 DIAGNOSIS — I2510 Atherosclerotic heart disease of native coronary artery without angina pectoris: Secondary | ICD-10-CM | POA: Diagnosis not present

## 2019-02-05 DIAGNOSIS — I6523 Occlusion and stenosis of bilateral carotid arteries: Secondary | ICD-10-CM | POA: Diagnosis not present

## 2019-02-05 DIAGNOSIS — E871 Hypo-osmolality and hyponatremia: Secondary | ICD-10-CM | POA: Diagnosis not present

## 2019-02-05 DIAGNOSIS — C4922 Malignant neoplasm of connective and soft tissue of left lower limb, including hip: Secondary | ICD-10-CM | POA: Diagnosis not present

## 2019-02-05 DIAGNOSIS — Z96642 Presence of left artificial hip joint: Secondary | ICD-10-CM | POA: Diagnosis not present

## 2019-02-05 DIAGNOSIS — I252 Old myocardial infarction: Secondary | ICD-10-CM | POA: Diagnosis not present

## 2019-02-05 DIAGNOSIS — I959 Hypotension, unspecified: Secondary | ICD-10-CM | POA: Diagnosis not present

## 2019-02-05 DIAGNOSIS — I10 Essential (primary) hypertension: Secondary | ICD-10-CM | POA: Diagnosis not present

## 2019-02-06 IMAGING — CT NM PET TUM IMG INITIAL (PI) SKULL BASE T - THIGH
1 of 10 series · 1 of 25 positions shown · non-contrast
Comparison: CT scan 05/05/2018

CLINICAL DATA: Initial treatment strategy for left thigh mass.

EXAM:
NUCLEAR MEDICINE PET SKULL BASE TO THIGH
TECHNIQUE: 7.7 mCi F-18 FDG was injected intravenously. Full-ring PET imaging
was performed from the skull base to thigh after the radiotracer. CT
data was obtained and used for attenuation correction and anatomic
localization.
Fasting blood glucose: 90 mg/dl

[Series 3: ct wb 5.0 b30f · axial · 5.0mm · 0.98mm/px · 1 of 329 slices shown]
[im 329/329  brain]
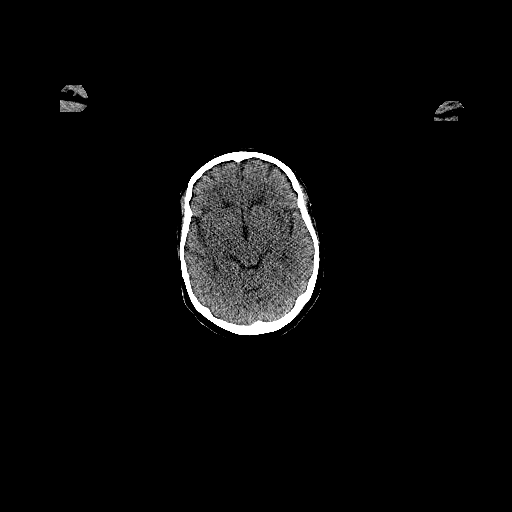

[1 of 25 positions shown; findings below may reference images not displayed]

FINDINGS: Mediastinal blood pool activity: SUV max

NECK: No hypermetabolic lymph nodes in the neck.

Incidental CT findings: Multinodular thyroid goiter.

CHEST: No enlarged or hypermetabolic mediastinal or hilar lymph
nodes. No supraclavicular or axillary adenopathy.

No worrisome pulmonary nodules to suggest pulmonary metastatic
disease.

Incidental CT findings: Mild cardiac enlargement and advanced
atherosclerotic calcifications involving the thoracic aorta and
branch vessels including the coronary arteries.

Bilateral breast prosthesis.

ABDOMEN/PELVIS: No abnormal hypermetabolic activity within the
liver, pancreas, adrenal glands, or spleen. No hypermetabolic lymph
nodes in the abdomen or pelvis.

Incidental CT findings: Advanced atherosclerotic calcifications
involving the aorta and iliac arteries with aortoiliac bypass graft.
There are also bilateral renal artery stents. Small bilateral renal
calculi and renal cysts..

SKELETON: The large hypervascular necrotic appearing left thigh mass
in the anterior compartment demonstrates marked hypermetabolism with
SUV max of 11.28 and consistent with a neoplastic process. No
involvement of the left femur is identified. No inguinal or
popliteal lymphadenopathy.

No areas of abnormal osseous uptake to suggest metastatic disease.

Incidental CT findings: none
IMPRESSION: 1. The large hypervascular necrotic appearing left thigh mass is
hypermetabolic and consistent with a neoplastic process.
2. No findings for metastatic disease involving the chest, abdomen,
pelvis or bony structures.

## 2019-02-12 DIAGNOSIS — I10 Essential (primary) hypertension: Secondary | ICD-10-CM | POA: Diagnosis not present

## 2019-02-12 DIAGNOSIS — I6523 Occlusion and stenosis of bilateral carotid arteries: Secondary | ICD-10-CM | POA: Diagnosis not present

## 2019-02-12 DIAGNOSIS — I2510 Atherosclerotic heart disease of native coronary artery without angina pectoris: Secondary | ICD-10-CM | POA: Diagnosis not present

## 2019-02-12 DIAGNOSIS — C4922 Malignant neoplasm of connective and soft tissue of left lower limb, including hip: Secondary | ICD-10-CM | POA: Diagnosis not present

## 2019-02-12 DIAGNOSIS — I959 Hypotension, unspecified: Secondary | ICD-10-CM | POA: Diagnosis not present

## 2019-02-12 DIAGNOSIS — E871 Hypo-osmolality and hyponatremia: Secondary | ICD-10-CM | POA: Diagnosis not present

## 2019-02-12 DIAGNOSIS — I252 Old myocardial infarction: Secondary | ICD-10-CM | POA: Diagnosis not present

## 2019-02-12 DIAGNOSIS — D62 Acute posthemorrhagic anemia: Secondary | ICD-10-CM | POA: Diagnosis not present

## 2019-02-12 DIAGNOSIS — Z96642 Presence of left artificial hip joint: Secondary | ICD-10-CM | POA: Diagnosis not present

## 2019-02-12 DIAGNOSIS — T8132XD Disruption of internal operation (surgical) wound, not elsewhere classified, subsequent encounter: Secondary | ICD-10-CM | POA: Diagnosis not present

## 2019-02-13 ENCOUNTER — Other Ambulatory Visit (INDEPENDENT_AMBULATORY_CARE_PROVIDER_SITE_OTHER): Payer: Self-pay | Admitting: Vascular Surgery

## 2019-02-13 ENCOUNTER — Other Ambulatory Visit: Payer: Self-pay | Admitting: Family Medicine

## 2019-02-13 DIAGNOSIS — I10 Essential (primary) hypertension: Secondary | ICD-10-CM

## 2019-02-20 DIAGNOSIS — E876 Hypokalemia: Secondary | ICD-10-CM | POA: Diagnosis not present

## 2019-02-20 DIAGNOSIS — D62 Acute posthemorrhagic anemia: Secondary | ICD-10-CM | POA: Diagnosis not present

## 2019-02-20 DIAGNOSIS — F329 Major depressive disorder, single episode, unspecified: Secondary | ICD-10-CM | POA: Diagnosis not present

## 2019-02-20 DIAGNOSIS — K521 Toxic gastroenteritis and colitis: Secondary | ICD-10-CM | POA: Diagnosis not present

## 2019-02-20 DIAGNOSIS — C4922 Malignant neoplasm of connective and soft tissue of left lower limb, including hip: Secondary | ICD-10-CM | POA: Diagnosis not present

## 2019-02-20 DIAGNOSIS — B954 Other streptococcus as the cause of diseases classified elsewhere: Secondary | ICD-10-CM | POA: Diagnosis not present

## 2019-02-20 DIAGNOSIS — Z7902 Long term (current) use of antithrombotics/antiplatelets: Secondary | ICD-10-CM | POA: Diagnosis not present

## 2019-02-20 DIAGNOSIS — Z85828 Personal history of other malignant neoplasm of skin: Secondary | ICD-10-CM | POA: Diagnosis not present

## 2019-02-20 DIAGNOSIS — B9689 Other specified bacterial agents as the cause of diseases classified elsewhere: Secondary | ICD-10-CM | POA: Diagnosis not present

## 2019-02-20 DIAGNOSIS — I350 Nonrheumatic aortic (valve) stenosis: Secondary | ICD-10-CM | POA: Diagnosis not present

## 2019-02-20 DIAGNOSIS — B3789 Other sites of candidiasis: Secondary | ICD-10-CM | POA: Diagnosis not present

## 2019-02-20 DIAGNOSIS — M868X5 Other osteomyelitis, thigh: Secondary | ICD-10-CM | POA: Diagnosis not present

## 2019-02-20 DIAGNOSIS — Z20828 Contact with and (suspected) exposure to other viral communicable diseases: Secondary | ICD-10-CM | POA: Diagnosis not present

## 2019-02-20 DIAGNOSIS — I252 Old myocardial infarction: Secondary | ICD-10-CM | POA: Diagnosis not present

## 2019-02-20 DIAGNOSIS — B957 Other staphylococcus as the cause of diseases classified elsewhere: Secondary | ICD-10-CM | POA: Diagnosis not present

## 2019-02-20 DIAGNOSIS — M25552 Pain in left hip: Secondary | ICD-10-CM | POA: Diagnosis not present

## 2019-02-20 DIAGNOSIS — I739 Peripheral vascular disease, unspecified: Secondary | ICD-10-CM | POA: Diagnosis not present

## 2019-02-20 DIAGNOSIS — T8131XD Disruption of external operation (surgical) wound, not elsewhere classified, subsequent encounter: Secondary | ICD-10-CM | POA: Diagnosis not present

## 2019-02-20 DIAGNOSIS — K219 Gastro-esophageal reflux disease without esophagitis: Secondary | ICD-10-CM | POA: Diagnosis not present

## 2019-02-20 DIAGNOSIS — R197 Diarrhea, unspecified: Secondary | ICD-10-CM | POA: Diagnosis not present

## 2019-02-20 DIAGNOSIS — T8131XA Disruption of external operation (surgical) wound, not elsewhere classified, initial encounter: Secondary | ICD-10-CM | POA: Diagnosis not present

## 2019-02-20 DIAGNOSIS — F3289 Other specified depressive episodes: Secondary | ICD-10-CM | POA: Diagnosis not present

## 2019-02-20 DIAGNOSIS — M868X8 Other osteomyelitis, other site: Secondary | ICD-10-CM | POA: Diagnosis not present

## 2019-02-20 DIAGNOSIS — Z96642 Presence of left artificial hip joint: Secondary | ICD-10-CM | POA: Diagnosis not present

## 2019-02-20 DIAGNOSIS — M8618 Other acute osteomyelitis, other site: Secondary | ICD-10-CM | POA: Diagnosis not present

## 2019-02-20 DIAGNOSIS — T8130XA Disruption of wound, unspecified, initial encounter: Secondary | ICD-10-CM | POA: Diagnosis not present

## 2019-02-20 DIAGNOSIS — Z8673 Personal history of transient ischemic attack (TIA), and cerebral infarction without residual deficits: Secondary | ICD-10-CM | POA: Diagnosis not present

## 2019-02-20 DIAGNOSIS — G8918 Other acute postprocedural pain: Secondary | ICD-10-CM | POA: Diagnosis not present

## 2019-02-20 DIAGNOSIS — I2510 Atherosclerotic heart disease of native coronary artery without angina pectoris: Secondary | ICD-10-CM | POA: Diagnosis not present

## 2019-02-20 DIAGNOSIS — Z792 Long term (current) use of antibiotics: Secondary | ICD-10-CM | POA: Diagnosis not present

## 2019-02-20 DIAGNOSIS — Z89612 Acquired absence of left leg above knee: Secondary | ICD-10-CM | POA: Diagnosis not present

## 2019-02-20 DIAGNOSIS — K5190 Ulcerative colitis, unspecified, without complications: Secondary | ICD-10-CM | POA: Diagnosis not present

## 2019-02-20 DIAGNOSIS — Z789 Other specified health status: Secondary | ICD-10-CM | POA: Diagnosis not present

## 2019-02-20 DIAGNOSIS — Z01810 Encounter for preprocedural cardiovascular examination: Secondary | ICD-10-CM | POA: Diagnosis not present

## 2019-02-20 DIAGNOSIS — I10 Essential (primary) hypertension: Secondary | ICD-10-CM | POA: Diagnosis not present

## 2019-02-20 DIAGNOSIS — C7652 Malignant neoplasm of left lower limb: Secondary | ICD-10-CM | POA: Diagnosis not present

## 2019-02-20 DIAGNOSIS — Z472 Encounter for removal of internal fixation device: Secondary | ICD-10-CM | POA: Diagnosis not present

## 2019-02-20 DIAGNOSIS — T8149XD Infection following a procedure, other surgical site, subsequent encounter: Secondary | ICD-10-CM | POA: Diagnosis not present

## 2019-02-20 DIAGNOSIS — T3695XA Adverse effect of unspecified systemic antibiotic, initial encounter: Secondary | ICD-10-CM | POA: Diagnosis not present

## 2019-02-20 DIAGNOSIS — E0500 Thyrotoxicosis with diffuse goiter without thyrotoxic crisis or storm: Secondary | ICD-10-CM | POA: Diagnosis not present

## 2019-02-20 DIAGNOSIS — T8452XD Infection and inflammatory reaction due to internal left hip prosthesis, subsequent encounter: Secondary | ICD-10-CM | POA: Diagnosis not present

## 2019-02-20 DIAGNOSIS — E0590 Thyrotoxicosis, unspecified without thyrotoxic crisis or storm: Secondary | ICD-10-CM | POA: Diagnosis not present

## 2019-02-20 DIAGNOSIS — T8452XA Infection and inflammatory reaction due to internal left hip prosthesis, initial encounter: Secondary | ICD-10-CM | POA: Diagnosis not present

## 2019-02-20 DIAGNOSIS — Z79899 Other long term (current) drug therapy: Secondary | ICD-10-CM | POA: Diagnosis not present

## 2019-02-20 DIAGNOSIS — E785 Hyperlipidemia, unspecified: Secondary | ICD-10-CM | POA: Diagnosis not present

## 2019-02-22 LAB — IRON,TIBC AND FERRITIN PANEL
Ferritin: 108
Iron: <10
UIBC: 191

## 2019-02-22 LAB — VITAMIN B12

## 2019-02-25 DIAGNOSIS — F329 Major depressive disorder, single episode, unspecified: Secondary | ICD-10-CM

## 2019-02-25 HISTORY — DX: Major depressive disorder, single episode, unspecified: F32.9

## 2019-02-25 LAB — TSH: TSH: 1.17 (ref <=5.90)

## 2019-02-28 HISTORY — PX: LEG AMPUTATION AT HIP: SHX693

## 2019-03-03 DIAGNOSIS — E878 Other disorders of electrolyte and fluid balance, not elsewhere classified: Secondary | ICD-10-CM | POA: Insufficient documentation

## 2019-03-07 ENCOUNTER — Telehealth: Payer: Self-pay | Admitting: Family Medicine

## 2019-03-07 DIAGNOSIS — D649 Anemia, unspecified: Secondary | ICD-10-CM | POA: Diagnosis not present

## 2019-03-07 DIAGNOSIS — M25511 Pain in right shoulder: Secondary | ICD-10-CM | POA: Diagnosis not present

## 2019-03-07 DIAGNOSIS — E785 Hyperlipidemia, unspecified: Secondary | ICD-10-CM | POA: Diagnosis not present

## 2019-03-07 DIAGNOSIS — Z9889 Other specified postprocedural states: Secondary | ICD-10-CM | POA: Diagnosis not present

## 2019-03-07 DIAGNOSIS — Z7409 Other reduced mobility: Secondary | ICD-10-CM | POA: Diagnosis not present

## 2019-03-07 DIAGNOSIS — I2510 Atherosclerotic heart disease of native coronary artery without angina pectoris: Secondary | ICD-10-CM | POA: Diagnosis not present

## 2019-03-07 DIAGNOSIS — I10 Essential (primary) hypertension: Secondary | ICD-10-CM | POA: Diagnosis not present

## 2019-03-07 DIAGNOSIS — Z87891 Personal history of nicotine dependence: Secondary | ICD-10-CM | POA: Diagnosis not present

## 2019-03-07 DIAGNOSIS — K5090 Crohn's disease, unspecified, without complications: Secondary | ICD-10-CM | POA: Diagnosis not present

## 2019-03-07 DIAGNOSIS — M869 Osteomyelitis, unspecified: Secondary | ICD-10-CM | POA: Diagnosis not present

## 2019-03-07 DIAGNOSIS — T847XXA Infection and inflammatory reaction due to other internal orthopedic prosthetic devices, implants and grafts, initial encounter: Secondary | ICD-10-CM | POA: Insufficient documentation

## 2019-03-07 DIAGNOSIS — E0590 Thyrotoxicosis, unspecified without thyrotoxic crisis or storm: Secondary | ICD-10-CM | POA: Diagnosis not present

## 2019-03-07 DIAGNOSIS — K5190 Ulcerative colitis, unspecified, without complications: Secondary | ICD-10-CM | POA: Diagnosis not present

## 2019-03-07 DIAGNOSIS — T847XXD Infection and inflammatory reaction due to other internal orthopedic prosthetic devices, implants and grafts, subsequent encounter: Secondary | ICD-10-CM | POA: Diagnosis not present

## 2019-03-07 DIAGNOSIS — Z4781 Encounter for orthopedic aftercare following surgical amputation: Secondary | ICD-10-CM | POA: Diagnosis not present

## 2019-03-07 DIAGNOSIS — G546 Phantom limb syndrome with pain: Secondary | ICD-10-CM | POA: Diagnosis not present

## 2019-03-07 DIAGNOSIS — T8452XD Infection and inflammatory reaction due to internal left hip prosthesis, subsequent encounter: Secondary | ICD-10-CM | POA: Diagnosis not present

## 2019-03-07 DIAGNOSIS — T8452XA Infection and inflammatory reaction due to internal left hip prosthesis, initial encounter: Secondary | ICD-10-CM | POA: Diagnosis not present

## 2019-03-07 DIAGNOSIS — Z89622 Acquired absence of left hip joint: Secondary | ICD-10-CM | POA: Diagnosis not present

## 2019-03-07 DIAGNOSIS — K529 Noninfective gastroenteritis and colitis, unspecified: Secondary | ICD-10-CM | POA: Diagnosis not present

## 2019-03-07 DIAGNOSIS — F329 Major depressive disorder, single episode, unspecified: Secondary | ICD-10-CM | POA: Diagnosis not present

## 2019-03-07 DIAGNOSIS — M8618 Other acute osteomyelitis, other site: Secondary | ICD-10-CM | POA: Diagnosis not present

## 2019-03-07 LAB — CBC AND DIFFERENTIAL
HCT: 27 — AB (ref 36–46)
Hemoglobin: 8.8 — AB (ref 12.0–16.0)
Platelets: 573 — AB (ref 150–399)
WBC: 7.5

## 2019-03-07 LAB — BASIC METABOLIC PANEL
BUN: 5 (ref 4–21)
Creatinine: 0.4 — AB (ref <=1.1)
Glucose: 94
Potassium: 3.9 (ref 3.4–5.3)
Sodium: 134 — AB (ref 137–147)

## 2019-03-07 NOTE — Telephone Encounter (Signed)
Pt needing to know if she had the pneumonia shot.  She had the amputation and is in rehab currently.  Please advise.  Thanks, [employer]

## 2019-03-07 NOTE — Telephone Encounter (Signed)
Patient advised.

## 2019-03-13 ENCOUNTER — Telehealth: Payer: Self-pay | Admitting: Family Medicine

## 2019-03-13 NOTE — Chronic Care Management (AMB) (Signed)
Chronic Care Management   Note  03/13/2019 Name: JASE HIMMELBERGER MRN: [id number] DOB: [birth year]  Misty [name] is a 78 y.o. year old female who is a primary care patient of Brita Romp, Dionne Bucy, MD. I reached out to [name] by phone today in response to a referral sent by Misty [name]'s health plan.     Misty [name] was given information about Chronic Care Management services today including:  1. CCM [name] includes personalized support from designated clinical staff supervised by her physician, including individualized plan of care and coordination with other care providers 2. 24/7 contact phone numbers for assistance for urgent and routine care needs. 3. [name] will only be billed when office clinical staff spend 20 minutes or more in a month to coordinate care. 4. Only one practitioner may furnish and bill [name] in a calendar month. 5. The patient may stop CCM services at any time (effective at the end of the month) by phone call to the office staff. 6. The patient will be responsible for cost sharing (co-pay) of up to 20% of [name] fee (after annual deductible is met).  Patient did not agree to enrollment in care management services and does not wish to consider at this time.  Follow up plan: The patient has been provided with contact information for the chronic care management team and has been advised to call with any health related questions or concerns.   [place]  [email or web address]   ??[phone]

## 2019-03-13 NOTE — Chronic Care Management (AMB) (Signed)
  Chronic Care Management   Outreach Note  03/13/2019 Name: Misty [name] MRN: [id number] DOB: [birth year]  Referred by: Virginia Crews, MD Reason for referral : Chronic Care Management (Initial CCM outreach was unsuccessful. )   An unsuccessful telephone outreach was attempted today. The patient was referred to the case management team by for assistance with care management and care coordination.   Follow Up Plan: A HIPPA compliant phone message was left for the patient providing contact information and requesting a return call.  The care management team will reach out to the patient again over the next 7 days.  If patient returns call to provider office, please advise to call [place] [facility] [place] . [place]  [email or web address]  ??[phone]

## 2019-03-17 DIAGNOSIS — K5190 Ulcerative colitis, unspecified, without complications: Secondary | ICD-10-CM | POA: Diagnosis not present

## 2019-03-17 DIAGNOSIS — I6523 Occlusion and stenosis of bilateral carotid arteries: Secondary | ICD-10-CM | POA: Diagnosis not present

## 2019-03-17 DIAGNOSIS — T8452XA Infection and inflammatory reaction due to internal left hip prosthesis, initial encounter: Secondary | ICD-10-CM | POA: Diagnosis not present

## 2019-03-17 DIAGNOSIS — Z5181 Encounter for therapeutic drug level monitoring: Secondary | ICD-10-CM | POA: Diagnosis not present

## 2019-03-17 DIAGNOSIS — M869 Osteomyelitis, unspecified: Secondary | ICD-10-CM | POA: Diagnosis not present

## 2019-03-17 DIAGNOSIS — I10 Essential (primary) hypertension: Secondary | ICD-10-CM | POA: Diagnosis not present

## 2019-03-17 DIAGNOSIS — I2510 Atherosclerotic heart disease of native coronary artery without angina pectoris: Secondary | ICD-10-CM | POA: Diagnosis not present

## 2019-03-17 DIAGNOSIS — E0590 Thyrotoxicosis, unspecified without thyrotoxic crisis or storm: Secondary | ICD-10-CM | POA: Diagnosis not present

## 2019-03-17 DIAGNOSIS — C4922 Malignant neoplasm of connective and soft tissue of left lower limb, including hip: Secondary | ICD-10-CM | POA: Diagnosis not present

## 2019-03-17 DIAGNOSIS — D500 Iron deficiency anemia secondary to blood loss (chronic): Secondary | ICD-10-CM | POA: Diagnosis not present

## 2019-03-17 DIAGNOSIS — Z792 Long term (current) use of antibiotics: Secondary | ICD-10-CM | POA: Diagnosis not present

## 2019-03-17 DIAGNOSIS — I252 Old myocardial infarction: Secondary | ICD-10-CM | POA: Diagnosis not present

## 2019-03-17 DIAGNOSIS — Z452 Encounter for adjustment and management of vascular access device: Secondary | ICD-10-CM | POA: Diagnosis not present

## 2019-03-19 DIAGNOSIS — T8452XA Infection and inflammatory reaction due to internal left hip prosthesis, initial encounter: Secondary | ICD-10-CM | POA: Diagnosis not present

## 2019-03-20 ENCOUNTER — Telehealth: Payer: Self-pay | Admitting: Family Medicine

## 2019-03-20 DIAGNOSIS — T847XXS Infection and inflammatory reaction due to other internal orthopedic prosthetic devices, implants and grafts, sequela: Secondary | ICD-10-CM | POA: Diagnosis not present

## 2019-03-20 DIAGNOSIS — T3695XS Adverse effect of unspecified systemic antibiotic, sequela: Secondary | ICD-10-CM | POA: Diagnosis not present

## 2019-03-20 DIAGNOSIS — Z09 Encounter for follow-up examination after completed treatment for conditions other than malignant neoplasm: Secondary | ICD-10-CM | POA: Diagnosis not present

## 2019-03-20 DIAGNOSIS — A499 Bacterial infection, unspecified: Secondary | ICD-10-CM | POA: Diagnosis not present

## 2019-03-20 DIAGNOSIS — T3695XA Adverse effect of unspecified systemic antibiotic, initial encounter: Secondary | ICD-10-CM | POA: Diagnosis not present

## 2019-03-20 DIAGNOSIS — Z792 Long term (current) use of antibiotics: Secondary | ICD-10-CM | POA: Diagnosis not present

## 2019-03-20 NOTE — Telephone Encounter (Signed)
OK for verbals

## 2019-03-20 NOTE — Telephone Encounter (Signed)
[employer] advised.

## 2019-03-20 NOTE — Telephone Encounter (Signed)
Elder Cyphers, PT w/ [place] [phone] - secure VM  PT Orders  2 times a week for 2 weeks 1 time a week for  2 weeks  Thanks, [facility]

## 2019-03-22 DIAGNOSIS — T8452XA Infection and inflammatory reaction due to internal left hip prosthesis, initial encounter: Secondary | ICD-10-CM | POA: Diagnosis not present

## 2019-03-22 DIAGNOSIS — T8452XS Infection and inflammatory reaction due to internal left hip prosthesis, sequela: Secondary | ICD-10-CM | POA: Diagnosis not present

## 2019-03-22 DIAGNOSIS — T8452XD Infection and inflammatory reaction due to internal left hip prosthesis, subsequent encounter: Secondary | ICD-10-CM | POA: Diagnosis not present

## 2019-03-23 ENCOUNTER — Emergency Department (HOSPITAL_COMMUNITY)
Admission: EM | Admit: 2019-03-23 | Discharge: 2019-03-24 | Disposition: A | Discharge status: Home / Self Care | Payer: PPO | Attending: Emergency Medicine | Admitting: Emergency Medicine

## 2019-03-23 ENCOUNTER — Encounter (HOSPITAL_COMMUNITY): Payer: Self-pay

## 2019-03-23 ENCOUNTER — Other Ambulatory Visit: Payer: Self-pay

## 2019-03-23 DIAGNOSIS — Z87891 Personal history of nicotine dependence: Secondary | ICD-10-CM | POA: Diagnosis not present

## 2019-03-23 DIAGNOSIS — Z79899 Other long term (current) drug therapy: Secondary | ICD-10-CM | POA: Insufficient documentation

## 2019-03-23 DIAGNOSIS — Z8673 Personal history of transient ischemic attack (TIA), and cerebral infarction without residual deficits: Secondary | ICD-10-CM | POA: Diagnosis not present

## 2019-03-23 DIAGNOSIS — I10 Essential (primary) hypertension: Secondary | ICD-10-CM | POA: Insufficient documentation

## 2019-03-23 DIAGNOSIS — L231 Allergic contact dermatitis due to adhesives: Secondary | ICD-10-CM | POA: Insufficient documentation

## 2019-03-23 DIAGNOSIS — E039 Hypothyroidism, unspecified: Secondary | ICD-10-CM | POA: Insufficient documentation

## 2019-03-23 DIAGNOSIS — Z452 Encounter for adjustment and management of vascular access device: Secondary | ICD-10-CM | POA: Diagnosis present

## 2019-03-23 LAB — COMPREHENSIVE METABOLIC PANEL
ALT: 14 U/L (ref 0–44)
AST: 17 U/L (ref 15–41)
Albumin: 3.2 g/dL — ABNORMAL LOW (ref 3.5–5.0)
Alkaline Phosphatase: 83 U/L (ref 38–126)
Anion gap: 9 (ref 5–15)
BUN: 15 mg/dL (ref 8–23)
CO2: 29 mmol/L (ref 22–32)
Calcium: 9.8 mg/dL (ref 8.9–10.3)
Chloride: 101 mmol/L (ref 98–111)
Creatinine, Ser: 0.55 mg/dL (ref 0.44–1.00)
GFR calc Af Amer: >60 mL/min (ref >60)
GFR calc non Af Amer: >60 mL/min (ref >60)
Glucose, Bld: 162 mg/dL — ABNORMAL HIGH (ref 70–99)
Potassium: 3.4 mmol/L — ABNORMAL LOW (ref 3.5–5.1)
Sodium: 139 mmol/L (ref 135–145)
Total Bilirubin: 0.3 mg/dL (ref 0.3–1.2)
Total Protein: 6.4 g/dL — ABNORMAL LOW (ref 6.5–8.1)

## 2019-03-23 LAB — CBC WITH DIFFERENTIAL/PLATELET
Abs Immature Granulocytes: 0.03 10*3/uL (ref 0.00–0.07)
Basophils Absolute: 0 10*3/uL (ref 0.0–0.1)
Basophils Relative: 1 %
Eosinophils Absolute: 0.6 10*3/uL — ABNORMAL HIGH (ref 0.0–0.5)
Eosinophils Relative: 11 %
HCT: 29.9 % — ABNORMAL LOW (ref 36.0–46.0)
Hemoglobin: 9.2 g/dL — ABNORMAL LOW (ref 12.0–15.0)
Immature Granulocytes: 1 %
Lymphocytes Relative: 16 %
Lymphs Abs: 1 10*3/uL (ref 0.7–4.0)
MCH: 26.7 pg (ref 26.0–34.0)
MCHC: 30.8 g/dL (ref 30.0–36.0)
MCV: 86.7 fL (ref 80.0–100.0)
Monocytes Absolute: 0.5 10*3/uL (ref 0.1–1.0)
Monocytes Relative: 9 %
Neutro Abs: 3.7 10*3/uL (ref 1.7–7.7)
Neutrophils Relative %: 62 %
Platelets: 512 10*3/uL — ABNORMAL HIGH (ref 150–400)
RBC: 3.45 MIL/uL — ABNORMAL LOW (ref 3.87–5.11)
RDW: 18 % — ABNORMAL HIGH (ref 11.5–15.5)
WBC: 5.9 10*3/uL (ref 4.0–10.5)
nRBC: 0 % (ref 0.0–0.2)

## 2019-03-23 LAB — LACTIC ACID, PLASMA: Lactic Acid, Venous: 1.4 mmol/L (ref 0.5–1.9)

## 2019-03-23 LAB — PROTIME-INR
INR: 1 (ref 0.8–1.2)
Prothrombin Time: 12.8 seconds (ref 11.4–15.2)

## 2019-03-23 NOTE — ED Provider Notes (Addendum)
[facility] EMERGENCY DEPARTMENT Provider Note   CSN: [id number] Arrival date & time: 03/23/19  1452     History   Chief Complaint Chief Complaint  Patient presents with  . PICC Line Infection    HPI Misty Page is a 78 y.o. female.     Patient presents to the emergency department at the recommendation of her home health nurse.  Patient had a recent amputation of her left leg.  She says that she had a central line placed for antibiotic administration.  She reports that there was anticipated that she was going to need long-term antibiotics, however this was not the case and the line is not currently being used.  It is scheduled for removal in 3 days.  Patient reports that she has been experiencing yellowish drainage from the site which is increasing.  She has not had any fever.  There is no pain.  Patient also experiencing dry, chapped and swollen lips.  She has been using lip balm without improvement.  No intraoral swelling, lesions.     Past Medical History:  Diagnosis Date  . Allergy   . Anemia   . Arthritis   . Colitis   . Complication of anesthesia    nausea  . Environmental and seasonal allergies   . Fibrocystic breast disease (FCBD)   . GERD (gastroesophageal reflux disease)   . High cholesterol   . History of heart artery stent   . Hyperlipidemia   . Hypertension   . Myocardial infarction ([place])   . Osteoporosis   . Thyroid disease   . TIA (transient ischemic attack)     Patient Active Problem List   Diagnosis Date Noted  . Osteomyelitis of left femur ([place]) 11/30/2018  . Hypokalemia 11/27/2018  . Post herpetic neuralgia 11/27/2018  . 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 Pain and swelling of left lower leg 05/01/2018  . 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 TIA (transient ischemic attack) 04/18/2017  . Bilateral carotid artery stenosis 02/07/2017  . Allergic rhinitis, seasonal 10/15/2014  . Cardiac murmur 10/15/2014  . History of  colon polyps 10/15/2014  . Hyperthyroidism 10/15/2014  . Cannot sleep 10/15/2014  . Calculus of kidney 10/15/2014  . Herpes zona 10/15/2014  . CA of skin 10/15/2014  . Ulcerative colitis ([place]) 10/15/2014  . Aortic heart valve narrowing 12/03/2013  . Arteriosclerosis of coronary artery 11/23/2013  . Myocardial infarction ([place]) 11/23/2013  . Peripheral vascular disease ([place]) 11/23/2013  . Diverticulitis of colon 07/21/2009  . Complication of internal prosthetic device 01/27/2009  . Hemorrhoids without complication 11/24/9483  . Absolute anemia 10/29/2008  . Essential (primary) hypertension 10/29/2008  . Acid reflux 10/29/2008  . Hypercholesteremia 10/29/2008  . OP (osteoporosis) 10/29/2008  . Healed myocardial infarct 05/31/1997    Past Surgical History:  Procedure Laterality Date  . ABDOMINAL HYSTERECTOMY  2007  . BREAST ENHANCEMENT SURGERY  2004  . BREAST IMPLANT REMOVAL    . BREAST SURGERY  1984   Fibrocystic Disease  . CAROTID ENDARTERECTOMY Left 06/21/2014   Dr. Delana Meyer  . CAROTID ENDARTERECTOMY Right 08/02/2014   Dr. Delana Meyer  . CAROTID PTA/STENT INTERVENTION N/A 06/15/2017   Procedure: CAROTID PTA/STENT INTERVENTION;  Surgeon: Katha Cabal, MD;  Location: [facility];  Service: Cardiovascular;  Laterality: N/A;  . COLONOSCOPY WITH PROPOFOL N/A 07/14/2015   Procedure: COLONOSCOPY WITH PROPOFOL;  Surgeon: Hulen Luster, MD;  Location: [facility] ENDOSCOPY;  Service: Gastroenterology;  Laterality: N/A;  . COLONOSCOPY WITH PROPOFOL  N/A 02/08/2018   Procedure: COLONOSCOPY WITH PROPOFOL;  Surgeon: Toledo, Benay Pike, MD;  Location: [facility] ENDOSCOPY;  Service: Gastroenterology;  Laterality: N/A;  . CORONARY ANGIOPLASTY WITH STENT PLACEMENT  1999  . ESOPHAGOGASTRODUODENOSCOPY (EGD) WITH PROPOFOL N/A 02/08/2018   Procedure: ESOPHAGOGASTRODUODENOSCOPY (EGD) WITH PROPOFOL;  Surgeon: Toledo, Benay Pike, MD;  Location: [facility] ENDOSCOPY;  Service: Gastroenterology;  Laterality: N/A;  .  FEMORAL ARTERY STENT  08/2003  . HERNIA REPAIR  2008  . RENAL ARTERY STENT  08/2003  . TRANSUMBILICAL AUGMENTATION MAMMAPLASTY       OB History    Gravida  1   Para  1   Term      Preterm      AB      Living        SAB      TAB      Ectopic      Multiple      Live Births               Home Medications    Prior to Admission medications   Medication Sig Start Date End Date Taking? Authorizing Provider  Ascorbic Acid (VITAMIN C) 1000 MG tablet Take 500 mg by mouth daily.     [provider]  B COMPLEX VITAMINS PO Take 1 tablet by mouth daily. B COMPLEX (Oral Capsule)  1 Every Day for 0 days  Quantity: 0.00;  Refills: 0   Ordered :13-Apr-2010  Johnette Abraham ;  Started 29-October-2008 Active Comments: DX: 414.00 10/29/08   [provider]  Calcium-Vitamin D-Vitamin K 500-100-40 MG-UNT-MCG CHEW Chew 1 tablet by mouth 2 (two) times daily. VIACTIV, 500-100-40 (Oral Tablet Chewable)  1 tablet twice a day for 0 days  Quantity: 0.00;  Refills: 0   Ordered :13-Apr-2010  Celene Kras, MA, Anastasiya ;  Started 29-October-2008 Active Comments: DX: 733.00 10/29/08   [provider]  clopidogrel (PLAVIX) 75 MG tablet TAKE 1 TABLET BY MOUTH ONCE A DAY 02/13/19   Schnier, Dolores Lory, MD  Coenzyme Q10 (COQ10) 200 MG CAPS Take 1 capsule by mouth at bedtime.    [provider]  Cranberry 300 MG tablet Take 300 mg by mouth daily.     [provider]  dicyclomine (BENTYL) 10 MG capsule  09/13/18   [provider]  docusate sodium (COLACE) 100 MG capsule Take 100 mg by mouth 2 (two) times daily.    [provider]  doxycycline (VIBRAMYCIN) 100 MG capsule  05/01/18   [provider]  ezetimibe (ZETIA) 10 MG tablet  09/06/18   [provider]  famotidine (PEPCID) 20 MG tablet Take 1 tablet (20 mg total) by mouth 2 (two) times daily. 01/16/19   Mar Daring, [name]  ferrous sulfate 325 (65 FE) MG EC tablet Take  325 mg by mouth daily. FERROUS SULFATE, 325 (65 Fe)MG (Oral Tablet Delayed Release)  1 every day for 0 days  Quantity: 0.00;  Refills: 0   Ordered :13-Apr-2010  Celene Kras, MA, Anastasiya ;  Started 29-October-2008 Active Comments: DX: 285.9 10/29/08   [provider]  fexofenadine (ALLEGRA) 180 MG tablet Take 180 mg by mouth at bedtime.     [provider]  fluticasone (FLONASE) 50 MCG/ACT nasal spray USE 2 SPRAYS EACH NOSTRIL DAILY 03/07/17   Mar Daring, [name]  gabapentin (NEURONTIN) 100 MG capsule Take 2 capsules (200 mg total) by mouth 3 (three) times daily. 11/20/18   Mar Daring, [name]  hyoscyamine (LEVSIN, ANASPAZ)  0.125 MG tablet Take by mouth. 01/16/18   [provider]  losartan (COZAAR) 25 MG tablet TAKE 1 TABLET BY MOUTH DAILY 02/13/19   Virginia Crews, MD  Mesalamine (ASACOL HD) 800 MG TBEC TAKE 2 TABLETS BY MOUTH TWICE A DAY. 09/26/17   [provider]  methimazole (TAPAZOLE) 5 MG tablet Take 2.5 mg by mouth.  03/30/17   [provider]  montelukast (SINGULAIR) 10 MG tablet Take 1 tablet (10 mg total) by mouth at bedtime. 05/26/18   Mar Daring, [name]  Multiple Vitamins-Minerals (CENTRUM SILVER PO) Take 1 capsule by mouth daily.    [provider]  OxyCODONE HCl, Abuse Deter, (OXAYDO) 5 MG TABA Take by mouth.    [provider]  rosuvastatin (CRESTOR) 20 MG tablet Take 0.5 tablets (10 mg total) by mouth daily. 05/26/18   Mar Daring, [name]  triamcinolone cream (KENALOG) 0.1 %  04/04/17   [provider]    Family History Family History  Problem Relation Age of Onset  . Hyperlipidemia Mother   . Congestive Heart Failure Mother   . COPD Mother   . Heart disease Mother   . Hypertension Mother   . Osteoporosis Mother   . Pancreatic cancer Father   . Arthritis Sister   . Alzheimer's disease Sister   . Hypertension Sister   . Prostate cancer Brother   . Heart attack Brother   .  Stomach cancer Brother   . Kidney failure Brother   . Leukemia Brother   . Emphysema Brother   . Pancreatic cancer Brother   . Stomach cancer Brother   . Breast cancer Paternal Grandmother   . Leukemia Paternal Grandfather   . Heart attack Maternal Uncle   . Stroke Maternal Aunt     Social History Social History   Tobacco Use  . Smoking status: Former Smoker    Quit date: 05/30/1994    Years since quitting: 24.8  . Smokeless tobacco: Never Used  Substance Use Topics  . Alcohol use: Yes    Comment: rare  . Drug use: No     Allergies   Amoxicillin, Naproxen, Penicillins, Codeine, Levofloxacin, and Shellfish allergy   Review of Systems Review of Systems  Skin: Positive for color change and rash.  All other systems reviewed and are negative.    Physical Exam Updated Vital Signs BP (!) 150/89 (BP Location: Left Arm)   Pulse 83   Temp 98.8 F (37.1 C) (Oral)   Resp 18   Ht 5' 1"  (1.549 m)   Wt 48.1 kg   SpO2 99%   BMI 20.03 kg/m   Physical Exam Vitals signs and nursing note reviewed.  Constitutional:      General: She is not in acute distress.    Appearance: Normal appearance. She is well-developed.  HENT:     Head: Normocephalic and atraumatic.     Comments: Dry, chapped and swollen lips with serosanguineous drainage    Right Ear: Hearing normal.     Left Ear: Hearing normal.     Nose: Nose normal.  Eyes:     Conjunctiva/sclera: Conjunctivae normal.     Pupils: Pupils are equal, round, and reactive to light.  Neck:     Musculoskeletal: Normal range of motion and neck supple.  Cardiovascular:     Rate and Rhythm: Regular rhythm.     Heart sounds: S1 normal and S2 normal. Murmur present. No friction rub. No gallop.   Pulmonary:  Effort: Pulmonary effort is normal. No respiratory distress.     Breath sounds: Normal breath sounds.  Chest:     Chest wall: No tenderness.  Abdominal:     General: Bowel sounds are normal.     Palpations: Abdomen is  soft.     Tenderness: There is no abdominal tenderness. There is no guarding or rebound. Negative signs include Murphy's sign and McBurney's sign.     Hernia: No hernia is present.  Musculoskeletal: Normal range of motion.  Skin:    General: Skin is warm and dry.     Findings: Rash (erythema and excoriation with serous drainage and crusting in a square distribution under  tegaderm covering line) present.  Neurological:     Mental Status: She is alert and oriented to person, place, and time.     GCS: GCS eye subscore is 4. GCS verbal subscore is 5. GCS motor subscore is 6.     Cranial Nerves: No cranial nerve deficit.     Sensory: No sensory deficit.     Coordination: Coordination normal.  Psychiatric:        Speech: Speech normal.        Behavior: Behavior normal.        Thought Content: Thought content normal.      ED Treatments / Results  Labs (all labs ordered are listed, but only abnormal results are displayed) Labs Reviewed  COMPREHENSIVE METABOLIC PANEL - Abnormal; Notable for the following components:      Result Value   Potassium 3.4 (*)    Glucose, Bld 162 (*)    Total Protein 6.4 (*)    Albumin 3.2 (*)    All other components within normal limits  CBC WITH DIFFERENTIAL/PLATELET - Abnormal; Notable for the following components:   RBC 3.45 (*)    Hemoglobin 9.2 (*)    HCT 29.9 (*)    RDW 18.0 (*)    Platelets 512 (*)    Eosinophils Absolute 0.6 (*)    All other components within normal limits  CULTURE, BLOOD (ROUTINE X 2)  CULTURE, BLOOD (ROUTINE X 2)  LACTIC ACID, PLASMA  PROTIME-INR  LACTIC ACID, PLASMA    EKG None  Radiology No results found.  Procedures Procedures (including critical care time)  Medications Ordered in ED Medications - No data to display   Initial Impression / Assessment and Plan / ED Course  I have reviewed the triage vital signs and the nursing notes.  Pertinent labs & imaging results that were available during my care of  the patient were reviewed by me and considered in my medical decision making (see chart for details).        Patient presents for evaluation of possible central line site infection.  Examination does not reveal any purulence.  There is erythema and serous drainage with skin breakdown around the PICC line but this is well demarcated underneath the Tegaderm.  Patient states that she has a severe allergy to plastic tape and adhesive, generally can only use paper tape.  This is very consistent with a contact dermatitis, not an infection.  Lab work is unremarkable.  She does have a murmur, but reports that this is chronic.  She reports that "doctors love to have medical students listen to my heart to hear my murmur".  No other sequela that would suggest endocarditis.  Will remove central line because it is scheduled for removal in 3 days anyway and is not currently being used.  Patient has had  multiple allergic reactions to antibiotics.  Nothing about this presentation suggest bacterial infection, would withhold empiric antibiotics, patient given return precautions.  She also has evidence of some form of dermatitis of the lips.  This does not suggest Stevens-Johnson or other severe reaction.  She has a well demarcated area of erythema around the lips with some swelling, chapping similar to eczematous lip eruption.  Recommend Vaseline to the area.  I did remove the central line myself.  Sutures were removed and then the line was gently removed and pressure was held over the insertion site at the right internal jugular for 5 minutes.  No bleeding from the tunneled site.  Patient monitored for 45 minutes after removal, no hematoma or other signs of complication.  Final Clinical Impressions(s) / ED Diagnoses   Final diagnoses:  Allergic contact dermatitis due to adhesives    ED Discharge Orders    None       , Gwenyth Allegra, MD 03/23/19 [id number]    Orpah Greek, MD 03/24/19 [id number]     Orpah Greek, MD 03/24/19 [phone]

## 2019-03-23 NOTE — ED Triage Notes (Signed)
Pt arrives POV for eval of R sided PICC Line infection. Pt reports she noted the redness and oozing yesterday. She said her home health nurse came to see her yesterday and this AM and advised she present here for further eval and poss replacement. Has PICC line for abx d/t amputation to L leg

## 2019-03-24 LAB — LACTIC ACID, PLASMA: Lactic Acid, Venous: 0.9 mmol/L (ref 0.5–1.9)

## 2019-03-24 NOTE — Discharge Instructions (Addendum)
Follow-up with your surgeon Tuesday as is scheduled.  If you develop fever, increasing redness, drainage from the site, chest pain or shortness of breath, return to the ER.

## 2019-03-26 ENCOUNTER — Inpatient Hospital Stay: Payer: Self-pay | Admitting: Family Medicine

## 2019-03-27 DIAGNOSIS — Z4802 Encounter for removal of sutures: Secondary | ICD-10-CM | POA: Diagnosis not present

## 2019-03-28 LAB — CULTURE, BLOOD (ROUTINE X 2)
Culture: NO GROWTH
Special Requests: ADEQUATE

## 2019-03-29 LAB — CULTURE, BLOOD (ROUTINE X 2): Culture: NO GROWTH

## 2019-04-03 DIAGNOSIS — R911 Solitary pulmonary nodule: Secondary | ICD-10-CM | POA: Diagnosis not present

## 2019-04-03 DIAGNOSIS — C4922 Malignant neoplasm of connective and soft tissue of left lower limb, including hip: Secondary | ICD-10-CM | POA: Diagnosis not present

## 2019-04-04 DIAGNOSIS — I10 Essential (primary) hypertension: Secondary | ICD-10-CM | POA: Diagnosis not present

## 2019-04-04 DIAGNOSIS — T8452XA Infection and inflammatory reaction due to internal left hip prosthesis, initial encounter: Secondary | ICD-10-CM | POA: Diagnosis not present

## 2019-04-04 DIAGNOSIS — M869 Osteomyelitis, unspecified: Secondary | ICD-10-CM | POA: Diagnosis not present

## 2019-04-04 DIAGNOSIS — Z5181 Encounter for therapeutic drug level monitoring: Secondary | ICD-10-CM | POA: Diagnosis not present

## 2019-04-04 DIAGNOSIS — I6523 Occlusion and stenosis of bilateral carotid arteries: Secondary | ICD-10-CM | POA: Diagnosis not present

## 2019-04-04 DIAGNOSIS — I252 Old myocardial infarction: Secondary | ICD-10-CM | POA: Diagnosis not present

## 2019-04-04 DIAGNOSIS — Z452 Encounter for adjustment and management of vascular access device: Secondary | ICD-10-CM | POA: Diagnosis not present

## 2019-04-04 DIAGNOSIS — I2510 Atherosclerotic heart disease of native coronary artery without angina pectoris: Secondary | ICD-10-CM | POA: Diagnosis not present

## 2019-04-04 DIAGNOSIS — Z792 Long term (current) use of antibiotics: Secondary | ICD-10-CM | POA: Diagnosis not present

## 2019-04-04 DIAGNOSIS — C4922 Malignant neoplasm of connective and soft tissue of left lower limb, including hip: Secondary | ICD-10-CM | POA: Diagnosis not present

## 2019-04-04 DIAGNOSIS — K5190 Ulcerative colitis, unspecified, without complications: Secondary | ICD-10-CM | POA: Diagnosis not present

## 2019-04-04 DIAGNOSIS — E0590 Thyrotoxicosis, unspecified without thyrotoxic crisis or storm: Secondary | ICD-10-CM | POA: Diagnosis not present

## 2019-04-04 DIAGNOSIS — D500 Iron deficiency anemia secondary to blood loss (chronic): Secondary | ICD-10-CM | POA: Diagnosis not present

## 2019-04-16 DIAGNOSIS — M75111 Incomplete rotator cuff tear or rupture of right shoulder, not specified as traumatic: Secondary | ICD-10-CM | POA: Diagnosis not present

## 2019-04-16 DIAGNOSIS — M7581 Other shoulder lesions, right shoulder: Secondary | ICD-10-CM | POA: Diagnosis not present

## 2019-04-17 DIAGNOSIS — D649 Anemia, unspecified: Secondary | ICD-10-CM | POA: Diagnosis not present

## 2019-04-17 DIAGNOSIS — R413 Other amnesia: Secondary | ICD-10-CM | POA: Diagnosis not present

## 2019-04-17 DIAGNOSIS — C4922 Malignant neoplasm of connective and soft tissue of left lower limb, including hip: Secondary | ICD-10-CM | POA: Diagnosis not present

## 2019-04-23 ENCOUNTER — Telehealth (HOSPITAL_COMMUNITY): Payer: Self-pay | Admitting: Student-PharmD

## 2019-04-23 DIAGNOSIS — E7800 Pure hypercholesterolemia, unspecified: Secondary | ICD-10-CM

## 2019-04-23 MED ORDER — ROSUVASTATIN CALCIUM 20 MG PO TABS: 10.0000 mg | ORAL_TABLET | Freq: Every day | ORAL | 3 refills | Status: DC

## 2019-04-23 NOTE — Progress Notes (Signed)
Hello,   I am a pharmacy resident that reviews [facility] patients for quality initiatives with non-adherence to medications for chronic disease management. My review of the patient's chart shows an active prescription for Rosuvastatin 20 mg but according to the pharmacy fill history, the patient has no refills left. [employer] verified patient last refilled Rosuvastatin 20 mg on 02/13/2019, has 0 refills and requires a new prescription.  Please consider sending a new prescription for Rosuvastatin 20 mg, #45, 90 day-supply with 3 refills.   Please contact Tiffany Benfield or Reed Breech with any questions or concerns.   Thanks,   Braysen Cloward L. Devin Going, [place] PGY1 Pharmacy Resident 04/23/19      3:03 PM

## 2019-04-23 NOTE — Addendum Note (Signed)
Addended by: Virginia Crews on: 04/23/2019 04:35 PM   Modules accepted: Orders

## 2019-04-23 NOTE — Telephone Encounter (Signed)
Rx sent 

## 2019-04-25 ENCOUNTER — Other Ambulatory Visit: Payer: Self-pay

## 2019-05-09 ENCOUNTER — Ambulatory Visit (INDEPENDENT_AMBULATORY_CARE_PROVIDER_SITE_OTHER): Payer: PPO | Admitting: Family Medicine

## 2019-05-09 ENCOUNTER — Encounter: Payer: Self-pay | Admitting: Family Medicine

## 2019-05-09 ENCOUNTER — Other Ambulatory Visit: Payer: Self-pay

## 2019-05-09 ENCOUNTER — Telehealth: Payer: Self-pay | Admitting: *Deleted

## 2019-05-09 VITALS — BP 174/86 | HR 97 | Temp 96.5°F | Wt 106.0 lb

## 2019-05-09 DIAGNOSIS — E0590 Thyrotoxicosis, unspecified without thyrotoxic crisis or storm: Secondary | ICD-10-CM

## 2019-05-09 DIAGNOSIS — I10 Essential (primary) hypertension: Secondary | ICD-10-CM

## 2019-05-09 DIAGNOSIS — K219 Gastro-esophageal reflux disease without esophagitis: Secondary | ICD-10-CM

## 2019-05-09 DIAGNOSIS — E7800 Pure hypercholesterolemia, unspecified: Secondary | ICD-10-CM | POA: Diagnosis not present

## 2019-05-09 DIAGNOSIS — J302 Other seasonal allergic rhinitis: Secondary | ICD-10-CM | POA: Diagnosis not present

## 2019-05-09 DIAGNOSIS — I252 Old myocardial infarction: Secondary | ICD-10-CM | POA: Diagnosis not present

## 2019-05-09 DIAGNOSIS — Z8673 Personal history of transient ischemic attack (TIA), and cerebral infarction without residual deficits: Secondary | ICD-10-CM | POA: Diagnosis not present

## 2019-05-09 DIAGNOSIS — K51911 Ulcerative colitis, unspecified with rectal bleeding: Secondary | ICD-10-CM | POA: Diagnosis not present

## 2019-05-09 DIAGNOSIS — I739 Peripheral vascular disease, unspecified: Secondary | ICD-10-CM

## 2019-05-09 DIAGNOSIS — I6523 Occlusion and stenosis of bilateral carotid arteries: Secondary | ICD-10-CM

## 2019-05-09 DIAGNOSIS — Z85831 Personal history of malignant neoplasm of soft tissue: Secondary | ICD-10-CM

## 2019-05-09 DIAGNOSIS — R413 Other amnesia: Secondary | ICD-10-CM

## 2019-05-09 DIAGNOSIS — E876 Hypokalemia: Secondary | ICD-10-CM | POA: Diagnosis not present

## 2019-05-09 DIAGNOSIS — Z89612 Acquired absence of left leg above knee: Secondary | ICD-10-CM

## 2019-05-09 MED ORDER — LOSARTAN POTASSIUM 50 MG PO TABS: 50.0000 mg | ORAL_TABLET | Freq: Every day | ORAL | 3 refills | Status: DC

## 2019-05-09 NOTE — Telephone Encounter (Signed)
From [facility] 

## 2019-05-09 NOTE — Telephone Encounter (Signed)
Source Subject Topic  Misty Page (Patient) Misty Page (Patient) General - Other  Patient's daughter requesting call back from CMA to discuss patient's medications. Specifically losartan and the dosage patient is currently on.

## 2019-05-09 NOTE — Telephone Encounter (Signed)
Pt's daughter Arby Barrette called to report Ms Verbeke is already taking Losartan 182m a day, and not 258m  At her office visit you wanted to double her Losartan.  Do you still want to do that?    She also states that pt is not taking amlodipine that was only for when she was in the hospital.  Please advise  Thanks,   -LaMickel Baas

## 2019-05-09 NOTE — Progress Notes (Signed)
Patient: Misty Page Female    DOB: [birth year]   78 y.o.   MRN: [id number] Visit Date: 05/09/2019  Today's Provider: Lavon Paganini, MD   Chief Complaint  Patient presents with  . Hospitalization Follow-up  . Hypertension  . Hyperlipidemia   Subjective:     HPI  Follow up Hospitalization Over the last year, patient has been hospitalized at [facility] multiple times.  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  She then developed postop wound dehiscence and hardware infection/osteomyelitis.  She was followed by ID and treated with long courses of multiple antibiotics.  When this failed, she underwent left leg amputation with hip disarticulation.  States she has been recovering well.  She wanted to follow-up with primary care to ensure that her medications and labs and routine care up-to-date.  She and her daughter also worried a little bit about her memory.  She seems to be forgetting stuff more frequently since her hospitalizations and surgeries over the last year.  She reports that she does have depressive symptoms and believes that this is warranted given all that she has been through this year.  She is not interested in taking a medication for this.  She is now followed by PM& R.  She is going to be having a custom wheelchair fitting later this week.  She does still have occasional phantom limb pain, but it is mostly resolved.  She is off of gabapentin.  She has a few oxycodone left at home that she takes only rarely  She is followed by Dr. Alice Reichert at [facility] for her ulcerative colitis.  She is taking mesalamine with good compliance.  She takes Bentyl and Colace as needed.  She also takes Pepcid twice daily for GERD symptoms.  She denies any recent UC flares.  Allergic rhinitis - singulair, allegra and flonase prn.  Well-controlled with seasonal flares.   HTN: - Medications:  Amlodipine 5 mg daily, losartan 25 mg daily - Compliance: Good - Checking BP at home: No - Denies any SOB, CP, vision changes, LE edema, medication SEs, or symptoms of hypotension - Diet: Low-sodium - Exercise: None regularly  HLD, history of MI, peripheral vascular disease, history of TIA, bilateral carotid artery stenosis - medications: Crestor 20 mg daily, Zetia 10 mg daily, Plavix 75 mg daily - compliance: Good - medication SEs: None  MMSE - Mini Mental State Exam 05/09/2019  Orientation to time 3  Orientation to Place 5  Registration 3  Attention/ Calculation 3  Recall 2  Language- name 2 objects 2  Language- repeat 1  Language- follow 3 step command 3  Language- read & follow direction 1  Write a sentence 1  Copy design 0  Total score 24    Allergies  Allergen Reactions  . Amoxicillin Anaphylaxis  . Naproxen Hives, Shortness Of Breath, Nausea And Vomiting and Swelling  . Penicillins Shortness Of Breath, Swelling and Rash    Has patient had a PCN reaction causing immediate rash, facial/tongue/throat swelling, SOB or lightheadedness with hypotension: Yes Has patient had a PCN reaction causing severe rash involving mucus membranes or skin necrosis: Yes Has patient had a PCN reaction that required hospitalization: Yes Has patient had a PCN reaction occurring within the last 10 years: No  If all of the above answers are "NO", then may proceed with Cephalosporin use.   . Codeine Nausea And Vomiting  .  Levofloxacin Nausea And Vomiting  . Shellfish Allergy Hives, Diarrhea, Nausea And Vomiting and Swelling     Current Outpatient Medications:  .  amLODipine (NORVASC) 5 MG tablet, Take by mouth., Disp: , Rfl:  .  Ascorbic Acid (VITAMIN C) 1000 MG tablet, Take 500 mg by mouth daily. , Disp: , Rfl:  .  B COMPLEX VITAMINS PO, Take 1 tablet by mouth daily. B COMPLEX (Oral Capsule)  1 Every Day for 0 days  Quantity: 0.00;  Refills: 0   Ordered :13-Apr-2010  Celene Kras, MA,  Anastasiya ;  Started 29-October-2008 Active Comments: DX: 414.00, Disp: , Rfl:  .  Calcium-Vitamin D-Vitamin K 500-100-40 MG-UNT-MCG CHEW, Chew 1 tablet by mouth 2 (two) times daily. VIACTIV, 500-100-40 (Oral Tablet Chewable)  1 tablet twice a day for 0 days  Quantity: 0.00;  Refills: 0   Ordered :13-Apr-2010  Celene Kras, MA, Anastasiya ;  Started 29-October-2008 Active Comments: DX: 733.00, Disp: , Rfl:  .  clopidogrel (PLAVIX) 75 MG tablet, TAKE 1 TABLET BY MOUTH ONCE A DAY, Disp: 30 tablet, Rfl: 5 .  Cranberry 300 MG tablet, Take 300 mg by mouth daily. , Disp: , Rfl:  .  dicyclomine (BENTYL) 10 MG capsule, , Disp: , Rfl:  .  docusate sodium (COLACE) 100 MG capsule, Take 100 mg by mouth 2 (two) times daily., Disp: , Rfl:  .  ezetimibe (ZETIA) 10 MG tablet, , Disp: , Rfl:  .  famotidine (PEPCID) 20 MG tablet, Take 1 tablet (20 mg total) by mouth 2 (two) times daily., Disp: 180 tablet, Rfl: 1 .  ferrous sulfate 325 (65 FE) MG EC tablet, Take 325 mg by mouth daily. FERROUS SULFATE, 325 (65 Fe)MG (Oral Tablet Delayed Release)  1 every day for 0 days  Quantity: 0.00;  Refills: 0   Ordered :13-Apr-2010  Celene Kras, MA, Anastasiya ;  Started 29-October-2008 Active Comments: DX: 285.9, Disp: , Rfl:  .  fexofenadine (ALLEGRA) 180 MG tablet, Take 180 mg by mouth at bedtime. , Disp: , Rfl:  .  fluticasone (FLONASE) 50 MCG/ACT nasal spray, USE 2 SPRAYS EACH NOSTRIL DAILY, Disp: 48 g, Rfl: 1 .  hyoscyamine (LEVSIN, ANASPAZ) 0.125 MG tablet, Take by mouth., Disp: , Rfl:  .  losartan (COZAAR) 25 MG tablet, TAKE 1 TABLET BY MOUTH DAILY, Disp: 30 tablet, Rfl: 2 .  Mesalamine (ASACOL HD) 800 MG TBEC, TAKE 2 TABLETS BY MOUTH TWICE A DAY., Disp: , Rfl:  .  methimazole (TAPAZOLE) 5 MG tablet, Take 2.5 mg by mouth. , Disp: , Rfl:  .  montelukast (SINGULAIR) 10 MG tablet, Take 1 tablet (10 mg total) by mouth at bedtime., Disp: 90 tablet, Rfl: 1 .  Multiple Vitamins-Minerals (CENTRUM SILVER PO), Take 1 capsule by mouth daily.,  Disp: , Rfl:  .  OxyCODONE HCl, Abuse Deter, (OXAYDO) 5 MG TABA, Take by mouth., Disp: , Rfl:  .  rosuvastatin (CRESTOR) 20 MG tablet, Take 0.5 tablets (10 mg total) by mouth daily., Disp: 45 tablet, Rfl: 3 .  triamcinolone cream (KENALOG) 0.1 %, , Disp: , Rfl:  .  Coenzyme Q10 (COQ10) 200 MG CAPS, Take 1 capsule by mouth at bedtime., Disp: , Rfl:  .  doxycycline (VIBRAMYCIN) 100 MG capsule, , Disp: , Rfl:  .  gabapentin (NEURONTIN) 100 MG capsule, Take 2 capsules (200 mg total) by mouth 3 (three) times daily. (Patient not taking: Reported on 05/09/2019), Disp: 90 capsule, Rfl: 3  Review of Systems  Constitutional: Negative.   Respiratory: Negative.  Cardiovascular: Negative.   Gastrointestinal: Negative.   Musculoskeletal: Positive for gait problem.  Neurological: Negative for dizziness, light-headedness and headaches.    Social History   Tobacco Use  . Smoking status: Former Smoker    Quit date: 05/30/1994    Years since quitting: 24.9  . Smokeless tobacco: Never Used  Substance Use Topics  . Alcohol use: Yes    Comment: rare      Objective:   BP (!) 156/86 (BP Location: Right Arm, Patient Position: Sitting, Cuff Size: Normal)   Pulse 97   Temp (!) 96.5 F (35.8 C) (Temporal)   Wt 106 lb (48.1 kg)   BMI 20.03 kg/m  Vitals:   05/09/19 1048 05/09/19 1106  BP: (!) 161/101 (!) 156/86  Pulse: 97   Temp: (!) 96.5 F (35.8 C)   TempSrc: Temporal   Weight: 106 lb (48.1 kg)   Body mass index is 20.03 kg/m.   Physical Exam Vitals reviewed.  Constitutional:      General: She is not in acute distress.    Appearance: Normal appearance. She is well-developed. She is not diaphoretic.  HENT:     Head: Normocephalic and atraumatic.  Eyes:     General: No scleral icterus.    Conjunctiva/sclera: Conjunctivae normal.  Neck:     Thyroid: No thyromegaly.  Cardiovascular:     Rate and Rhythm: Normal rate and regular rhythm.     Heart sounds: Murmur present.  Pulmonary:      Effort: Pulmonary effort is normal. No respiratory distress.     Breath sounds: Normal breath sounds. No wheezing, rhonchi or rales.  Abdominal:     General: There is no distension.     Palpations: Abdomen is soft.     Tenderness: There is no abdominal tenderness. There is no guarding or rebound.  Musculoskeletal:     Cervical back: Neck supple.     Right lower leg: No edema.     Comments: Sitting in a wheelchair Status post left AKA  Lymphadenopathy:     Cervical: No cervical adenopathy.  Skin:    General: Skin is warm and dry.     Findings: No rash.  Neurological:     Mental Status: She is alert and oriented to person, place, and time. Mental status is at baseline.  Psychiatric:        Mood and Affect: Mood normal.        Behavior: Behavior normal.      No results found for any visits on 05/09/19.     Assessment & Plan    Problem List Items Addressed This Visit      Cardiovascular and Mediastinum   Essential (primary) hypertension - Primary    Uncontrolled Advised to monitor home blood pressures Recheck metabolic panel Continue amlodipine 5 mg daily Increase losartan to 50 mg daily      Relevant Medications   amLODipine (NORVASC) 5 MG tablet   losartan (COZAAR) 50 MG tablet   Other Relevant Orders   CMP (Comprehensive metabolic panel) (Completed)   Peripheral vascular disease ([place])    Followed by cardiology and vascular surgery Status post bilateral iliac and femoral stenting and left renal artery stent Continue Plavix Discussed importance of blood pressure control      Relevant Medications   amLODipine (NORVASC) 5 MG tablet   losartan (COZAAR) 50 MG tablet   Bilateral carotid artery stenosis    Followed by cardiology and vascular surgery      Relevant Medications  amLODipine (NORVASC) 5 MG tablet   losartan (COZAAR) 50 MG tablet     Respiratory   Allergic rhinitis, seasonal    Chronic and intermittent Well-controlled Continue Singulair,  Allegra, Flonase as needed        Digestive   Acid reflux    Chronic and well-controlled Continue Pepcid twice daily      Ulcerative colitis ([place])    Followed by GI Continue mesalamine No recent flares        Endocrine   Hyperthyroidism    Followed by endocrinology Continue methimazole        Other   Hypercholesteremia    Discussed goal LDL of less than 70 in the setting of significant vascular disease and history of TIA and MI Continue Crestor 20 mg daily, Zetia 10 mg daily Recheck lipid panel and CMP      Relevant Medications   amLODipine (NORVASC) 5 MG tablet   losartan (COZAAR) 50 MG tablet   Other Relevant Orders   Lipid panel (Completed)   CMP (Comprehensive metabolic panel) (Completed)   History of MI (myocardial infarction)    Followed by cardiology Not currently on a beta-blocker, but would consider this in the future Discussed importance of good blood pressure control Continue Plavix      History of TIA (transient ischemic attack)    No evidence of recurrent TIA or stroke Continue Crestor, Zetia, Plavix      History of sarcoma    Status post radiation and aggressive resection Followed by oncology Due to complications from her surgery, she is now status post left AKA      Hypokalemia    Noted on previous labs Recheck metabolic panel Previously thought to be related to her HCTZ, but nutritional status may be contributing      Relevant Orders   CMP (Comprehensive metabolic panel) (Completed)   Status post above-knee amputation of left lower extremity ([place])    Patient is recovering well She is followed by physiatry She is not currently using a prosthetic She is set for a custom wheelchair fitting later this week      Memory changes    Patient did only scored 24 out of 30 on MMSE today Unclear what her baseline is Patient, her daughter, and I discussed that she may have some age-related cognitive decline or mild cognitive impairment, but  it is also possible that pseudodementia from her depression and all the stress that she has been through this year may also be contributing We will not start a medication like Aricept at this time, but we will consider this in the future At follow-up, we will repeat MMSE and ensure that is not changing          Return in about 6 months (around 11/07/2019) for CPE/AWV.  Approximately 45 minutes was spent in discussion of which greater than 50% was consultation.    The entirety of the information documented in the History of Present Illness, Review of Systems and Physical Exam were personally obtained by me. Portions of this information were initially documented by Ashley Royalty, CMA and reviewed by me for thoroughness and accuracy.    , Dionne Bucy, MD MPH [facility]

## 2019-05-10 ENCOUNTER — Encounter: Payer: Self-pay | Admitting: Family Medicine

## 2019-05-10 ENCOUNTER — Telehealth: Payer: Self-pay

## 2019-05-10 DIAGNOSIS — Z89612 Acquired absence of left leg above knee: Secondary | ICD-10-CM | POA: Insufficient documentation

## 2019-05-10 DIAGNOSIS — R413 Other amnesia: Secondary | ICD-10-CM | POA: Insufficient documentation

## 2019-05-10 LAB — COMPREHENSIVE METABOLIC PANEL
ALT: 15 IU/L (ref 0–32)
AST: 17 IU/L (ref 0–40)
Albumin/Globulin Ratio: 2 (ref 1.2–2.2)
Albumin: 4.2 g/dL (ref 3.7–4.7)
Alkaline Phosphatase: 77 IU/L (ref 39–117)
BUN/Creatinine Ratio: 35 — ABNORMAL HIGH (ref 12–28)
BUN: 15 mg/dL (ref 8–27)
Bilirubin Total: 0.3 mg/dL (ref 0.0–1.2)
CO2: 26 mmol/L (ref 20–29)
Calcium: 10.1 mg/dL (ref 8.7–10.3)
Chloride: 99 mmol/L (ref 96–106)
Creatinine, Ser: 0.43 mg/dL — ABNORMAL LOW (ref 0.57–1.00)
GFR calc Af Amer: 113 mL/min/{1.73_m2} (ref >59)
GFR calc non Af Amer: 98 mL/min/{1.73_m2} (ref >59)
Globulin, Total: 2.1 g/dL (ref 1.5–4.5)
Glucose: 106 mg/dL — ABNORMAL HIGH (ref 65–99)
Potassium: 3.6 mmol/L (ref 3.5–5.2)
Sodium: 140 mmol/L (ref 134–144)
Total Protein: 6.3 g/dL (ref 6.0–8.5)

## 2019-05-10 LAB — LIPID PANEL
Chol/HDL Ratio: 3.4 ratio (ref 0.0–4.4)
Cholesterol, Total: 186 mg/dL (ref 100–199)
HDL: 54 mg/dL (ref >39)
LDL Chol Calc (NIH): 112 mg/dL — ABNORMAL HIGH (ref 0–99)
Triglycerides: 112 mg/dL (ref 0–149)
VLDL Cholesterol Cal: 20 mg/dL (ref 5–40)

## 2019-05-10 NOTE — Assessment & Plan Note (Signed)
Followed by endocrinology Continue methimazole

## 2019-05-10 NOTE — Assessment & Plan Note (Signed)
Followed by cardiology and vascular surgery Status post bilateral iliac and femoral stenting and left renal artery stent Continue Plavix Discussed importance of blood pressure control

## 2019-05-10 NOTE — Assessment & Plan Note (Signed)
Chronic and intermittent Well-controlled Continue Singulair, Allegra, Flonase as needed

## 2019-05-10 NOTE — Assessment & Plan Note (Signed)
Uncontrolled Advised to monitor home blood pressures Recheck metabolic panel Continue amlodipine 5 mg daily Increase losartan to 50 mg daily

## 2019-05-10 NOTE — Assessment & Plan Note (Signed)
No evidence of recurrent TIA or stroke Continue Crestor, Zetia, Plavix

## 2019-05-10 NOTE — Telephone Encounter (Signed)
-----   Message from Virginia Crews, MD sent at 05/10/2019 11:22 AM EST ----- Normal kidney function, liver function, electrolytes.  Cholesterol is not quite to goal (want LDL <70 given her heart and vascular disease).  Is she taking Crestor and Zetia regularly?  Also recommend diet low in saturated fats.

## 2019-05-10 NOTE — Telephone Encounter (Signed)
LMTCB

## 2019-05-10 NOTE — Assessment & Plan Note (Signed)
Chronic and well-controlled Continue Pepcid twice daily

## 2019-05-10 NOTE — Telephone Encounter (Signed)
Patient advised as below. Patient reports she is taking Crestor and Zetia daily. Patient not sure if she is taking half a tablet of Crestor or full tablet. Will call back to confirm dose.

## 2019-05-10 NOTE — Telephone Encounter (Signed)
OK. Please update medication list.  Do not increase Losartan if already taking 158m daily.  We can add the amlodipine 517mdaily.  Ok to send Rx for #30 with 3 refills.  Monitor home BPs.

## 2019-05-10 NOTE — Assessment & Plan Note (Signed)
Patient did only scored 24 out of 30 on MMSE today Unclear what her baseline is Patient, her daughter, and I discussed that she may have some age-related cognitive decline or mild cognitive impairment, but it is also possible that pseudodementia from her depression and all the stress that she has been through this year may also be contributing We will not start a medication like Aricept at this time, but we will consider this in the future At follow-up, we will repeat MMSE and ensure that is not changing

## 2019-05-10 NOTE — Assessment & Plan Note (Signed)
Followed by cardiology and vascular surgery

## 2019-05-10 NOTE — Assessment & Plan Note (Signed)
Followed by cardiology Not currently on a beta-blocker, but would consider this in the future Discussed importance of good blood pressure control Continue Plavix

## 2019-05-10 NOTE — Assessment & Plan Note (Signed)
Discussed goal LDL of less than 70 in the setting of significant vascular disease and history of TIA and MI Continue Crestor 20 mg daily, Zetia 10 mg daily Recheck lipid panel and CMP

## 2019-05-10 NOTE — Assessment & Plan Note (Signed)
Patient is recovering well She is followed by physiatry She is not currently using a prosthetic She is set for a custom wheelchair fitting later this week

## 2019-05-10 NOTE — Assessment & Plan Note (Signed)
Followed by GI Continue mesalamine No recent flares

## 2019-05-10 NOTE — Assessment & Plan Note (Signed)
Noted on previous labs Recheck metabolic panel Previously thought to be related to her HCTZ, but nutritional status may be contributing

## 2019-05-10 NOTE — Assessment & Plan Note (Signed)
Status post radiation and aggressive resection Followed by oncology Due to complications from her surgery, she is now status post left AKA

## 2019-05-11 MED ORDER — AMLODIPINE BESYLATE 5 MG PO TABS: 5.0000 mg | ORAL_TABLET | Freq: Every day | ORAL | 3 refills | Status: DC

## 2019-05-11 NOTE — Telephone Encounter (Signed)
Mrs. Misty Page reports that [name] is taking Zetia 72m daily and Crestor 251m1/2 tablet daily.

## 2019-05-11 NOTE — Telephone Encounter (Signed)
Misty Page advised as below and they are on treatment.

## 2019-05-14 NOTE — Telephone Encounter (Signed)
[name] advised as below.

## 2019-05-14 NOTE — Telephone Encounter (Signed)
If she can tolerate it, recommend increasing Crestor to a whole tabelt daily.

## 2019-05-15 ENCOUNTER — Ambulatory Visit: Payer: Self-pay | Admitting: *Deleted

## 2019-05-15 NOTE — Telephone Encounter (Signed)
Please review

## 2019-05-15 NOTE — Telephone Encounter (Signed)
PEC

## 2019-05-15 NOTE — Telephone Encounter (Signed)
Daughter called to give information on her mom. She is not with the daughter but states that the care giver has stated the patient has a rash on her stomach and has b/p of 190/102 and a temp of 100.2. Advised daughter that we would need to speak with the patient and caregiver because she is not there. She voiced understanding and will wait for a call back.  She provided the care giver name and #.   Called patient and care giver to get another b/p and see how the patient is doing. Sharee Pimple, the caregiver stated the patient c/o being cold when she got there. So she took her temp and it was 100.2 and she had a headache. The patient refused Tylenol when offered to her.  Asked her to repeat her b/p and it is now 130/80 and pulse is 105. She also stated the never has a headache or a runny nose. She denies cough, shortness of breath, diarrhea or constipation. She is drinking fluids without difficulty. She also stated that she had been out to lunch about 1.5 weeks ago with a friend. And only at the provider's office last week. Advised that she should be tested for covid. Will call her daughter and advised.  Daughter advised and she stated that she will go ahead and try to get her tested today for covid 19. She is also advised of the quarantine period depending on symptoms. She voiced understanding and will call back if any symptom gets worst. Will notify [facility] for review and disposition. Routing to the practice.   Reason for Disposition . [1] HIGH RISK patient (e.g., age > 24 years, diabetes, heart or lung disease, weak immune system) AND [2] new or worsening symptoms  Answer Assessment - Initial Assessment Questions 1. COVID-19 DIAGNOSIS: "Who made your Coronavirus (COVID-19) diagnosis?" "Was it confirmed by a positive lab test?" If not diagnosed by a HCP, ask "Are there lots of cases (community spread) where you live?" (See public health department website, if unsure)     In the  community 2. COVID-19 EXPOSURE: "Was there any known exposure to COVID before the symptoms began?" CDC Definition of close contact: within 6 feet (2 meters) for a total of 15 minutes or more over a 24-hour period.      no 3. ONSET: "When did the COVID-19 symptoms start?"      today 4. WORST SYMPTOM: "What is your worst symptom?" (e.g., cough, fever, shortness of breath, muscle aches)     fever 5. COUGH: "Do you have a cough?" If so, ask: "How bad is the cough?"       no 6. FEVER: "Do you have a fever?" If so, ask: "What is your temperature, how was it measured, and when did it start?"     100.2 to 98 7. RESPIRATORY STATUS: "Describe your breathing?" (e.g., shortness of breath, wheezing, unable to speak)      Breathing normal 8. BETTER-SAME-WORSE: "Are you getting better, staying the same or getting worse compared to yesterday?"  If getting worse, ask, "In what way?"     worse 9. HIGH RISK DISEASE: "Do you have any chronic medical problems?" (e.g., asthma, heart or lung disease, weak immune system, obesity, etc.)     Cancer, HTN 10. PREGNANCY: "Is there any chance you are pregnant?" "When was your last menstrual period?"       n/a 11. OTHER SYMPTOMS: "Do you have any other symptoms?"  (e.g., chills, fatigue, headache, loss of smell or  taste, muscle pain, sore throat; new loss of smell or taste especially support the diagnosis of COVID-19)       Headache, chills this morning  Protocols used: CORONAVIRUS (COVID-19) DIAGNOSED OR SUSPECTED-A-AH

## 2019-05-16 NOTE — Telephone Encounter (Signed)
Mrs. Page advised.

## 2019-05-16 NOTE — Telephone Encounter (Signed)
Agree with COVID testing. If getting worse or would like further recommendations, we can do a virtual visit.  If she develops significant SOB, recommend being seen at ER.

## 2019-05-17 ENCOUNTER — Encounter: Payer: Self-pay | Admitting: Intensive Care

## 2019-05-17 ENCOUNTER — Emergency Department: Payer: PPO

## 2019-05-17 ENCOUNTER — Emergency Department
Admission: EM | Admit: 2019-05-17 | Discharge: 2019-05-17 | Disposition: A | Discharge status: Home / Self Care | Payer: PPO | Attending: Emergency Medicine | Admitting: Emergency Medicine

## 2019-05-17 ENCOUNTER — Ambulatory Visit (INDEPENDENT_AMBULATORY_CARE_PROVIDER_SITE_OTHER): Payer: PPO | Admitting: Family Medicine

## 2019-05-17 ENCOUNTER — Encounter: Payer: Self-pay | Admitting: Family Medicine

## 2019-05-17 ENCOUNTER — Other Ambulatory Visit: Payer: Self-pay

## 2019-05-17 DIAGNOSIS — Z79899 Other long term (current) drug therapy: Secondary | ICD-10-CM | POA: Insufficient documentation

## 2019-05-17 DIAGNOSIS — Z955 Presence of coronary angioplasty implant and graft: Secondary | ICD-10-CM | POA: Insufficient documentation

## 2019-05-17 DIAGNOSIS — I252 Old myocardial infarction: Secondary | ICD-10-CM | POA: Insufficient documentation

## 2019-05-17 DIAGNOSIS — Z7902 Long term (current) use of antithrombotics/antiplatelets: Secondary | ICD-10-CM | POA: Insufficient documentation

## 2019-05-17 DIAGNOSIS — R410 Disorientation, unspecified: Secondary | ICD-10-CM

## 2019-05-17 DIAGNOSIS — Z03818 Encounter for observation for suspected exposure to other biological agents ruled out: Secondary | ICD-10-CM | POA: Diagnosis not present

## 2019-05-17 DIAGNOSIS — R4182 Altered mental status, unspecified: Secondary | ICD-10-CM | POA: Diagnosis present

## 2019-05-17 DIAGNOSIS — Z8673 Personal history of transient ischemic attack (TIA), and cerebral infarction without residual deficits: Secondary | ICD-10-CM | POA: Insufficient documentation

## 2019-05-17 DIAGNOSIS — I10 Essential (primary) hypertension: Secondary | ICD-10-CM | POA: Diagnosis not present

## 2019-05-17 DIAGNOSIS — Z20828 Contact with and (suspected) exposure to other viral communicable diseases: Secondary | ICD-10-CM | POA: Insufficient documentation

## 2019-05-17 DIAGNOSIS — E0590 Thyrotoxicosis, unspecified without thyrotoxic crisis or storm: Secondary | ICD-10-CM

## 2019-05-17 DIAGNOSIS — Z87891 Personal history of nicotine dependence: Secondary | ICD-10-CM | POA: Insufficient documentation

## 2019-05-17 DIAGNOSIS — R509 Fever, unspecified: Secondary | ICD-10-CM | POA: Diagnosis not present

## 2019-05-17 DIAGNOSIS — S0990XA Unspecified injury of head, initial encounter: Secondary | ICD-10-CM | POA: Diagnosis not present

## 2019-05-17 DIAGNOSIS — Z85828 Personal history of other malignant neoplasm of skin: Secondary | ICD-10-CM | POA: Insufficient documentation

## 2019-05-17 HISTORY — DX: Diverticulitis of intestine, part unspecified, without perforation or abscess without bleeding: K57.92

## 2019-05-17 LAB — COMPREHENSIVE METABOLIC PANEL
ALT: 13 U/L (ref 0–44)
AST: 16 U/L (ref 15–41)
Albumin: 3.8 g/dL (ref 3.5–5.0)
Alkaline Phosphatase: 57 U/L (ref 38–126)
Anion gap: 10 (ref 5–15)
BUN: 16 mg/dL (ref 8–23)
CO2: 29 mmol/L (ref 22–32)
Calcium: 9.6 mg/dL (ref 8.9–10.3)
Chloride: 98 mmol/L (ref 98–111)
Creatinine, Ser: <0.3 mg/dL — ABNORMAL LOW (ref 0.44–1.00)
Glucose, Bld: 93 mg/dL (ref 70–99)
Potassium: 3.4 mmol/L — ABNORMAL LOW (ref 3.5–5.1)
Sodium: 137 mmol/L (ref 135–145)
Total Bilirubin: 0.5 mg/dL (ref 0.3–1.2)
Total Protein: 6.5 g/dL (ref 6.5–8.1)

## 2019-05-17 LAB — URINALYSIS, ROUTINE W REFLEX MICROSCOPIC
Bilirubin Urine: NEGATIVE
Glucose, UA: NEGATIVE mg/dL
Hgb urine dipstick: NEGATIVE
Ketones, ur: 5 mg/dL — AB
Leukocytes,Ua: NEGATIVE
Nitrite: NEGATIVE
Protein, ur: NEGATIVE mg/dL
Specific Gravity, Urine: 1.016 (ref 1.005–1.030)
pH: 5 (ref 5.0–8.0)

## 2019-05-17 LAB — PROTIME-INR
INR: 0.9 (ref 0.8–1.2)
Prothrombin Time: 11.9 seconds (ref 11.4–15.2)

## 2019-05-17 LAB — CBC
HCT: 38.9 % (ref 36.0–46.0)
Hemoglobin: 12.5 g/dL (ref 12.0–15.0)
MCH: 26.8 pg (ref 26.0–34.0)
MCHC: 32.1 g/dL (ref 30.0–36.0)
MCV: 83.5 fL (ref 80.0–100.0)
Platelets: 428 10*3/uL — ABNORMAL HIGH (ref 150–400)
RBC: 4.66 MIL/uL (ref 3.87–5.11)
RDW: 17.2 % — ABNORMAL HIGH (ref 11.5–15.5)
WBC: 5 10*3/uL (ref 4.0–10.5)
nRBC: 0 % (ref 0.0–0.2)

## 2019-05-17 LAB — GLUCOSE, CAPILLARY: Glucose-Capillary: 76 mg/dL (ref 70–99)

## 2019-05-17 LAB — TSH: TSH: <0.01 u[IU]/mL — ABNORMAL LOW (ref 0.350–4.500)

## 2019-05-17 LAB — TROPONIN I (HIGH SENSITIVITY): Troponin I (High Sensitivity): 10 ng/L (ref <18)

## 2019-05-17 LAB — POC SARS CORONAVIRUS 2 AG: SARS Coronavirus 2 Ag: NEGATIVE

## 2019-05-17 LAB — T4, FREE: Free T4: 1.32 ng/dL — ABNORMAL HIGH (ref 0.61–1.12)

## 2019-05-17 NOTE — ED Notes (Signed)
Dr. Funke at bedside. 

## 2019-05-17 NOTE — Discharge Instructions (Addendum)
Her thyroid levels were abnormal with elevated T4 and low TSH.  I discussed with our endocrinologist who suggested if she was on the 2.5 of methimazole she should increase it to 5 mg.  And if she is on the 5 mg she can increase it to the 10 mg.  She should follow-up with her endocrinologist in the next 1-2 weeks. Return to ER for fevers, worsening confusion, any other concerns.   Quarantine at home until COVID results come back

## 2019-05-17 NOTE — ED Notes (Signed)
X-ray at bedside

## 2019-05-17 NOTE — Progress Notes (Signed)
Patient: Misty Page Female    DOB: March 10, 1941   78 y.o.   MRN: [id number] Visit Date: 05/17/2019  Today's Provider: Lavon Paganini, MD   Chief Complaint  Patient presents with  . Memory Loss   Subjective:    Virtual Visit via Video Note  I connected with Misty Page on 05/17/19 at 10:40 AM EST by a video enabled telemedicine application and verified that I am speaking with the correct person using two identifiers.  Location: Patient location: home Provider location: [facility] Persons involved in the visit: patient, provider, patient's daughter   I discussed the limitations of evaluation and management by telemedicine and the availability of in person appointments. The patient expressed understanding and agreed to proceed.   HPI   Misty Page (patient's daughter) reports that her mother Misty Page has been very confused and showing dementia like behavior x3 days. Patient has fallen 3 times in the last few days. Patient is in a wheelchair, due to an amputation. Patient has had trouble with responding correctly to questions. Misty Page reports that this morning Misty Page fell down and did not want to help to get up. Patient has been refusing to take medications as directed. Patient's BP has been elevated due to patient not taking medication. Patient did have a low grade fever yesterday, but none today. Denies shortness of breath, cough, chest pain, facial asymmetry, dysarthria, notable asymmetrical weakness.    Allergies  Allergen Reactions  . Amoxicillin Anaphylaxis  . Naproxen Hives, Shortness Of Breath, Nausea And Vomiting and Swelling  . Penicillins Shortness Of Breath, Swelling and Rash    Has patient had a PCN reaction causing immediate rash, facial/tongue/throat swelling, SOB or lightheadedness with hypotension: Yes Has patient had a PCN reaction causing severe rash involving mucus membranes or skin necrosis: Yes Has patient had a PCN reaction  that required hospitalization: Yes Has patient had a PCN reaction occurring within the last 10 years: No  If all of the above answers are "NO", then may proceed with Cephalosporin use.   . Codeine Nausea And Vomiting  . Levofloxacin Nausea And Vomiting  . Shellfish Allergy Hives, Diarrhea, Nausea And Vomiting and Swelling     Current Outpatient Medications:  .  amLODipine (NORVASC) 5 MG tablet, Take 1 tablet (5 mg total) by mouth daily., Disp: 30 tablet, Rfl: 3 .  Ascorbic Acid (VITAMIN C) 1000 MG tablet, Take 500 mg by mouth daily. , Disp: , Rfl:  .  B COMPLEX VITAMINS PO, Take 1 tablet by mouth daily. B COMPLEX (Oral Capsule)  1 Every Day for 0 days  Quantity: 0.00;  Refills: 0   Ordered :13-Apr-2010  Celene Kras, MA, Anastasiya ;  Started 29-October-2008 Active Comments: DX: 414.00, Disp: , Rfl:  .  Calcium-Vitamin D-Vitamin K 500-100-40 MG-UNT-MCG CHEW, Chew 1 tablet by mouth 2 (two) times daily. VIACTIV, 500-100-40 (Oral Tablet Chewable)  1 tablet twice a day for 0 days  Quantity: 0.00;  Refills: 0   Ordered :13-Apr-2010  Celene Kras, MA, Anastasiya ;  Started 29-October-2008 Active Comments: DX: 733.00, Disp: , Rfl:  .  clopidogrel (PLAVIX) 75 MG tablet, TAKE 1 TABLET BY MOUTH ONCE A DAY, Disp: 30 tablet, Rfl: 5 .  Cranberry 300 MG tablet, Take 300 mg by mouth daily. , Disp: , Rfl:  .  dicyclomine (BENTYL) 10 MG capsule, , Disp: , Rfl:  .  docusate sodium (COLACE) 100 MG capsule, Take 100 mg by mouth 2 (two)  times daily., Disp: , Rfl:  .  ezetimibe (ZETIA) 10 MG tablet, , Disp: , Rfl:  .  famotidine (PEPCID) 20 MG tablet, Take 1 tablet (20 mg total) by mouth 2 (two) times daily., Disp: 180 tablet, Rfl: 1 .  ferrous sulfate 325 (65 FE) MG EC tablet, Take 325 mg by mouth daily. FERROUS SULFATE, 325 (65 Fe)MG (Oral Tablet Delayed Release)  1 every day for 0 days  Quantity: 0.00;  Refills: 0   Ordered :13-Apr-2010  Celene Kras, MA, Anastasiya ;  Started 29-October-2008 Active Comments: DX: 285.9, Disp:  , Rfl:  .  fexofenadine (ALLEGRA) 180 MG tablet, Take 180 mg by mouth at bedtime. , Disp: , Rfl:  .  fluticasone (FLONASE) 50 MCG/ACT nasal spray, USE 2 SPRAYS EACH NOSTRIL DAILY, Disp: 48 g, Rfl: 1 .  hyoscyamine (LEVSIN, ANASPAZ) 0.125 MG tablet, Take by mouth., Disp: , Rfl:  .  losartan (COZAAR) 50 MG tablet, Take 1 tablet (50 mg total) by mouth daily., Disp: 90 tablet, Rfl: 3 .  Mesalamine (ASACOL HD) 800 MG TBEC, TAKE 2 TABLETS BY MOUTH TWICE A DAY., Disp: , Rfl:  .  methimazole (TAPAZOLE) 5 MG tablet, Take 2.5 mg by mouth. , Disp: , Rfl:  .  montelukast (SINGULAIR) 10 MG tablet, Take 1 tablet (10 mg total) by mouth at bedtime., Disp: 90 tablet, Rfl: 1 .  Multiple Vitamins-Minerals (CENTRUM SILVER PO), Take 1 capsule by mouth daily., Disp: , Rfl:  .  OxyCODONE HCl, Abuse Deter, (OXAYDO) 5 MG TABA, Take by mouth., Disp: , Rfl:  .  rosuvastatin (CRESTOR) 20 MG tablet, Take 0.5 tablets (10 mg total) by mouth daily., Disp: 45 tablet, Rfl: 3  Review of Systems  Constitutional: Positive for activity change, fatigue and fever. Negative for appetite change, chills, diaphoresis and unexpected weight change.  HENT: Negative.   Respiratory: Negative.   Cardiovascular: Negative.   Gastrointestinal: Negative.   Psychiatric/Behavioral: Positive for agitation, behavioral problems and confusion.    Social History   Tobacco Use  . Smoking status: Former Smoker    Quit date: 05/30/1994    Years since quitting: 24.9  . Smokeless tobacco: Never Used  Substance Use Topics  . Alcohol use: Yes    Comment: rare      Objective:   There were no vitals taken for this visit. There were no vitals filed for this visit.There is no height or weight on file to calculate BMI.   Physical Exam Constitutional:      Comments: Lethargic, falls asleep in the middle of our conversation  Neurological:     Comments: Only oriented to person, does not know where she is (at her own home), or the year (says 2021).  Requires a lot of prompting to answer questions No appreciable facial asymmetry      No results found for any visits on 05/17/19.     Assessment & Plan      I discussed the assessment and treatment plan with the patient. The patient was provided an opportunity to ask questions and all were answered. The patient agreed with the plan and demonstrated an understanding of the instructions.   The patient was advised to call back or seek an in-person evaluation if the symptoms worsen or if the condition fails to improve as anticipated.  1. Delirium -New problem -May have some minimal underlying cognitive deficits, but she is significantly changed from her baseline from 1 week ago when I saw her -She is lethargic and only oriented  to person -Discussed with her daughter concern for the various things that could be contributing to this and her need for emergent evaluation in the emergency department including labs and head imaging -Discussed that an infectious process is possible -Patient's daughter agrees to proceed and take her mother to the emergency department -Heads up given to the ER first nurse at [facility]   The entirety of the information documented in the History of Present Illness, Review of Systems and Physical Exam were personally obtained by me. Portions of this information were initially documented by Lynford Humphrey, CMA and reviewed by me for thoroughness and accuracy.    Jiovani Mccammon, Dionne Bucy, MD MPH [facility]

## 2019-05-17 NOTE — ED Provider Notes (Signed)
[facility] Emergency Department Provider Note  ____________________________________________   First MD Initiated Contact with Patient 05/17/19 1459     (approximate)  I have reviewed the triage vital signs and the nursing notes.   HISTORY  Chief Complaint Altered Mental Status    HPI Misty Page is a 78 y.o. female with hypertension, hyperlipidemia, thyroid disease who comes in with new confusion. Patient was seen by the family medicine doctor today and noted that she seemed different from 1 week ago.  She is lethargic and only oriented to person.  Per patient's daughter she does live alone but has caregivers.  She has been more disoriented over the past 3 to 4 days.  She is had 3 falls as well. Per patient's daughter she had a left leg amputation back in September due to a osteosarcoma that was replaced but then kept getting infected. She seemed to have recovered well from that however over the past 1 week she has becoming more confused. She states that she was confused and telling her that they were going to go get a piano today which was not true. She has had 3 episodes where she slid out of her wheelchair that was witnessed by the caregiver but not by the daughter herself. The patient herself says that she has felt weak the past few days but she is alert and oriented x3 for me. She denies any chest pain, shortness of breath. She did have a mild headache yesterday and reported temperature of 100 but that has since resolved today. She denies any dysuria, abdominal pain, leg pain, back pain. The confusion has been going on about 1 week, mild to moderate, nothing makes it better, nothing makes it worse.          Past Medical History:  Diagnosis Date  . Allergy   . Anemia   . Arthritis   . CA of skin 10/15/2014  . Calculus of kidney 10/15/2014   Seen in ER 06/16/10.   . Colitis   . Complication of anesthesia    nausea  . Complication of internal prosthetic  device 01/27/2009  . Diverticulitis   . Environmental and seasonal allergies   . Fibrocystic breast disease (FCBD)   . GERD (gastroesophageal reflux disease)   . High cholesterol   . History of heart artery stent   . Hyperlipidemia   . Hypertension   . Myocardial infarction ([place])   . Osteomyelitis of left femur ([place]) 11/30/2018  . Osteoporosis   . Thyroid disease   . TIA (transient ischemic attack)     Patient Active Problem List   Diagnosis Date Noted  . Status post above-knee amputation of left lower extremity ([place]) 05/10/2019  . Memory changes 05/10/2019  . Hypokalemia 11/27/2018  . History of sarcoma 05/08/2018  . Pain and swelling of left lower leg 05/01/2018  . 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 History of TIA (transient ischemic attack) 04/18/2017  . Bilateral carotid artery stenosis 02/07/2017  . Allergic rhinitis, seasonal 10/15/2014  . Cardiac murmur 10/15/2014  . History of colon polyps 10/15/2014  . Hyperthyroidism 10/15/2014  . Cannot sleep 10/15/2014  . Herpes zona 10/15/2014  . Ulcerative colitis ([place]) 10/15/2014  . Aortic heart valve narrowing 12/03/2013  . Arteriosclerosis of coronary artery 11/23/2013  . History of MI (myocardial infarction) 11/23/2013  . Peripheral vascular disease ([place]) 11/23/2013  . Diverticulitis of colon 07/21/2009  . Hemorrhoids without complication 12/75/1700  . Absolute anemia 10/29/2008  .  Essential (primary) hypertension 10/29/2008  . Acid reflux 10/29/2008  . Hypercholesteremia 10/29/2008  . OP (osteoporosis) 10/29/2008    Past Surgical History:  Procedure Laterality Date  . ABDOMINAL HYSTERECTOMY  2007  . BREAST ENHANCEMENT SURGERY  2004  . BREAST IMPLANT REMOVAL    . BREAST SURGERY  1984   Fibrocystic Disease  . CAROTID ENDARTERECTOMY Left 06/21/2014   Dr. Delana Meyer  . CAROTID ENDARTERECTOMY Right 08/02/2014   Dr. Delana Meyer  . CAROTID PTA/STENT INTERVENTION N/A 06/15/2017   Procedure: CAROTID PTA/STENT  INTERVENTION;  Surgeon: Katha Cabal, MD;  Location: [facility];  Service: Cardiovascular;  Laterality: N/A;  . COLONOSCOPY WITH PROPOFOL N/A 07/14/2015   Procedure: COLONOSCOPY WITH PROPOFOL;  Surgeon: Hulen Luster, MD;  Location: [facility] ENDOSCOPY;  Service: Gastroenterology;  Laterality: N/A;  . COLONOSCOPY WITH PROPOFOL N/A 02/08/2018   Procedure: COLONOSCOPY WITH PROPOFOL;  Surgeon: Toledo, Benay Pike, MD;  Location: [facility] ENDOSCOPY;  Service: Gastroenterology;  Laterality: N/A;  . CORONARY ANGIOPLASTY WITH STENT PLACEMENT  1999  . ESOPHAGOGASTRODUODENOSCOPY (EGD) WITH PROPOFOL N/A 02/08/2018   Procedure: ESOPHAGOGASTRODUODENOSCOPY (EGD) WITH PROPOFOL;  Surgeon: Toledo, Benay Pike, MD;  Location: [facility] ENDOSCOPY;  Service: Gastroenterology;  Laterality: N/A;  . FEMORAL ARTERY STENT  08/2003  . HERNIA REPAIR  2008  . LEG AMPUTATION AT HIP Left 02/28/2019  . RENAL ARTERY STENT  08/2003  . TRANSUMBILICAL AUGMENTATION MAMMAPLASTY      Prior to Admission medications   Medication Sig Start Date End Date Taking? Authorizing Provider  amLODipine (NORVASC) 5 MG tablet Take 1 tablet (5 mg total) by mouth daily. 05/11/19   Virginia Crews, MD  Ascorbic Acid (VITAMIN C) 1000 MG tablet Take 500 mg by mouth daily.     [provider]  B COMPLEX VITAMINS PO Take 1 tablet by mouth daily. B COMPLEX (Oral Capsule)  1 Every Day for 0 days  Quantity: 0.00;  Refills: 0   Ordered :13-Apr-2010  Johnette Abraham ;  Started 29-October-2008 Active Comments: DX: 414.00 10/29/08   [provider]  Calcium-Vitamin D-Vitamin K 500-100-40 MG-UNT-MCG CHEW Chew 1 tablet by mouth 2 (two) times daily. VIACTIV, 500-100-40 (Oral Tablet Chewable)  1 tablet twice a day for 0 days  Quantity: 0.00;  Refills: 0   Ordered :13-Apr-2010  Celene Kras, MA, Anastasiya ;  Started 29-October-2008 Active Comments: DX: 733.00 10/29/08   [provider]  clopidogrel (PLAVIX) 75 MG tablet TAKE 1 TABLET BY  MOUTH ONCE A DAY 02/13/19   Schnier, Dolores Lory, MD  Cranberry 300 MG tablet Take 300 mg by mouth daily.     [provider]  dicyclomine (BENTYL) 10 MG capsule  09/13/18   [provider]  docusate sodium (COLACE) 100 MG capsule Take 100 mg by mouth 2 (two) times daily.    [provider]  ezetimibe (ZETIA) 10 MG tablet  09/06/18   [provider]  famotidine (PEPCID) 20 MG tablet Take 1 tablet (20 mg total) by mouth 2 (two) times daily. 01/16/19   Mar Daring, [name]  ferrous sulfate 325 (65 FE) MG EC tablet Take 325 mg by mouth daily. FERROUS SULFATE, 325 (65 Fe)MG (Oral Tablet Delayed Release)  1 every day for 0 days  Quantity: 0.00;  Refills: 0   Ordered :13-Apr-2010  Celene Kras, MA, Anastasiya ;  Started 29-October-2008 Active Comments: DX: 285.9 10/29/08   [provider]  fexofenadine (ALLEGRA) 180 MG tablet Take 180 mg by mouth at bedtime.  [provider]  fluticasone (FLONASE) 50 MCG/ACT nasal spray USE 2 SPRAYS EACH NOSTRIL DAILY 03/07/17   Mar Daring, [name]  hyoscyamine (LEVSIN, ANASPAZ) 0.125 MG tablet Take by mouth. 01/16/18   [provider]  losartan (COZAAR) 50 MG tablet Take 1 tablet (50 mg total) by mouth daily. 05/09/19   Virginia Crews, MD  Mesalamine (ASACOL HD) 800 MG TBEC TAKE 2 TABLETS BY MOUTH TWICE A DAY. 09/26/17   [provider]  methimazole (TAPAZOLE) 5 MG tablet Take 2.5 mg by mouth.  03/30/17   [provider]  montelukast (SINGULAIR) 10 MG tablet Take 1 tablet (10 mg total) by mouth at bedtime. 05/26/18   Mar Daring, [name]  Multiple Vitamins-Minerals (CENTRUM SILVER PO) Take 1 capsule by mouth daily.    [provider]  OxyCODONE HCl, Abuse Deter, (OXAYDO) 5 MG TABA Take by mouth.    [provider]  rosuvastatin (CRESTOR) 20 MG tablet Take 0.5 tablets (10 mg total) by mouth daily. 04/23/19   Virginia Crews, MD    Allergies Amoxicillin,  Naproxen, Penicillins, Codeine, Levofloxacin, and Shellfish allergy  Family History  Problem Relation Age of Onset  . Hyperlipidemia Mother   . Congestive Heart Failure Mother   . COPD Mother   . Heart disease Mother   . Hypertension Mother   . Osteoporosis Mother   . Pancreatic cancer Father   . Arthritis Sister   . Alzheimer's disease Sister   . Hypertension Sister   . Prostate cancer Brother   . Heart attack Brother   . Stomach cancer Brother   . Kidney failure Brother   . Leukemia Brother   . Emphysema Brother   . Pancreatic cancer Brother   . Stomach cancer Brother   . Breast cancer Paternal Grandmother   . Leukemia Paternal Grandfather   . Heart attack Maternal Uncle   . Stroke Maternal Aunt     Social History Social History   Tobacco Use  . Smoking status: Former Smoker    Quit date: 05/30/1994    Years since quitting: 24.9  . Smokeless tobacco: Never Used  Substance Use Topics  . Alcohol use: Yes    Comment: rare  . Drug use: No      Review of Systems Constitutional: Fever yesterday now resolved Eyes: No visual changes. ENT: No sore throat. Cardiovascular: Denies chest pain. Respiratory: Denies shortness of breath. Gastrointestinal: No abdominal pain.  No nausea, no vomiting.  No diarrhea.  No constipation. Genitourinary: Negative for dysuria. Musculoskeletal: Negative for back pain. Skin: Negative for rash. Neurological: Headache yesterday now resolved, no focal weakness or numbness. Some confusion. All other ROS negative ____________________________________________   PHYSICAL EXAM:  VITAL SIGNS: ED Triage Vitals [05/17/19 1218]  Enc Vitals Group     BP 116/85     Pulse Rate 79     Resp 16     Temp 98.9 F (37.2 C)     Temp Source Oral     SpO2 96 %     Weight 106 lb (48.1 kg)     Height 5' 1"  (1.549 m)     Head Circumference      Peak Flow      Pain Score 0     Pain Loc      Pain Edu?      Excl. in [place]?     Constitutional:  Alert and oriented x3. Well appearing and in no acute distress. Eyes: Conjunctivae are normal. EOMI.  Head: Atraumatic. Nose: No congestion/rhinnorhea. Mouth/Throat: Mucous membranes are moist.   Neck: No stridor. Trachea Midline. FROM Cardiovascular: Normal rate, regular rhythm. Grossly normal heart sounds.  Good peripheral circulation. Respiratory: Normal respiratory effort.  No retractions. Lungs CTAB. Gastrointestinal: Soft and nontender. No distention. No abdominal bruits. Depends on with mild rash where the depends are rubbing up against her skin. Musculoskeletal: No lower extremity tenderness nor edema. AKA on the left. Incision site well healing. Neurologic:  Normal speech and language. No gross focal neurologic deficits are appreciated. Patient is alert and oriented x3. Equal strength in arms. Equal strength in her legs. Finger-to-nose is intact. Cranial nerves II through XII are intact. Skin:  Skin is warm, dry and intact. No rash noted. Psychiatric: Mood and affect are normal. Speech and behavior are normal. GU: Deferred   ____________________________________________   LABS (all labs ordered are listed, but only abnormal results are displayed)  Labs Reviewed  COMPREHENSIVE METABOLIC PANEL - Abnormal; Notable for the following components:      Result Value   Potassium 3.4 (*)    Creatinine, Ser <0.30 (*)    All other components within normal limits  CBC - Abnormal; Notable for the following components:   RDW 17.2 (*)    Platelets 428 (*)    All other components within normal limits  TSH - Abnormal; Notable for the following components:   TSH <0.010 (*)    All other components within normal limits  T4, FREE - Abnormal; Notable for the following components:   Free T4 1.32 (*)    All other components within normal limits  URINALYSIS, ROUTINE W REFLEX MICROSCOPIC - Abnormal; Notable for the following components:   Color, Urine AMBER (*)    APPearance CLOUDY (*)    Ketones,  ur 5 (*)    All other components within normal limits  PROTIME-INR  GLUCOSE, CAPILLARY  CBG MONITORING, ED  POC SARS CORONAVIRUS 2 AG -  ED  POC SARS CORONAVIRUS 2 AG  TROPONIN I (HIGH SENSITIVITY)  TROPONIN I (HIGH SENSITIVITY)   ____________________________________________   ED ECG REPORT I, Misty Page, the attending physician, personally viewed and interpreted this ECG.  EKG is normal sinus rate of 79, no ST elevation no T wave inversions, normal intervals ____________________________________________  RADIOLOGY Misty Page, personally viewed and evaluated these images (plain radiographs) as part of my medical decision making, as well as reviewing the written report by the radiologist.  ED MD interpretation: CT head personally reviewed no evidence of intracranial mass.  Chest x-ray was also negative.  Official radiology report(s): CT Head Wo Contrast  Result Date: 05/17/2019 CLINICAL DATA:  Head trauma.  Confused, disoriented EXAM: CT HEAD WITHOUT CONTRAST TECHNIQUE: Contiguous axial images were obtained from the base of the skull through the vertex without intravenous contrast. COMPARISON:  None. FINDINGS: Brain: No evidence of acute infarction, hemorrhage, extra-axial collection, ventriculomegaly, or mass effect. Generalized cerebral atrophy. Periventricular white matter low attenuation likely secondary to microangiopathy. Vascular: Cerebrovascular atherosclerotic calcifications are noted. Skull: Negative for fracture or focal lesion. Sinuses/Orbits: Visualized portions of the orbits are unremarkable. Visualized portions of the paranasal sinuses and mastoid air cells are unremarkable. Other: None. IMPRESSION: No acute intracranial pathology. Electronically Signed   By: Misty Page   On: 05/17/2019 15:48   DG Chest Portable 1 View  Result Date: 05/17/2019 CLINICAL DATA:  Altered mental status. Fever. Three falls in the last 2 days. EXAM: PORTABLE CHEST 1 VIEW COMPARISON:   07/29/2014  FINDINGS: Heart size and pulmonary vascularity are normal. Lungs are clear. No effusions. No acute bone abnormality. Aortic atherosclerosis. IMPRESSION: 1. No acute abnormalities. 2.  Aortic Atherosclerosis (ICD10-I70.0). Electronically Signed   By: Misty Page M.D.   On: 05/17/2019 16:06    ____________________________________________   PROCEDURES  Procedure(s) performed (including Critical Care):  Procedures   ____________________________________________   INITIAL IMPRESSION / ASSESSMENT AND PLAN / ED COURSE  VELINDA WROBEL was evaluated in Emergency Department on 05/17/2019 for the symptoms described in the history of present illness. She was evaluated in the context of the global COVID-19 pandemic, which necessitated consideration that the patient might be at risk for infection with the SARS-CoV-2 virus that causes COVID-19. Institutional protocols and algorithms that pertain to the evaluation of patients at risk for COVID-19 are in a state of rapid change based on information released by regulatory bodies including the CDC and federal and state organizations. These policies and algorithms were followed during the patient's care in the ED.    Patient is a well-appearing 78 year old with normal vital signs has had increasing confusion over the last week. Patient really denies any symptoms besides just some weakness. Will get labs to evaluate for electrolyte abnormalities, AKI. CT head to evaluate for intracranial hemorrhage versus tumor. Also get urine to evaluate for UTI. Will get labs evaluate for UTI. Lower suspicion for ACS but will confirm with cardiac marker.  No other evidence of trauma based upon my examination.  Glucose was normal.  Labs are reassuring.  White count no evidence of infection.  No evidence of anemia.  Platelets around baseline  Trope was 10 I very low suspicion for ACS therefore we will hold off on repeat.  Thyroid labs were abnormal with a free T4 of  1.32 and a TSH less than 0.010.  No evidence of thyroid storm at this time.  I discussed with Dr. Gabriel Carina from endocrinology and this definitely could be contributing to some confusion and would recommend that she is on 2.5 as in her chart that she should go up to 5 mg and if she is on 5 mg she can go up to 10 mg.  I discussed with the daughter and she is not exactly sure how much she is currently taking given the pills are at home but they will follow these recommendations and they do have an endocrinologist that they can follow-up with if she is already on a higher dose than 10 mg.  They will also follow-up with her regardless for recheck.  Discussed with patient's daughter on admission versus discharge home.  Given her vital signs are stable and she is currently alert and oriented x3 with only the abnormal thyroid meds which indicates that we can follow-up with outpatient daughter  feels comfortable taking her home with making the med  adjustments and keeping a close eye on her and if things are worsening they can return to the ER.  We will do a long-acting Covid swab as well.  I discussed the provisional nature of ED diagnosis, the treatment so far, the ongoing plan of care, follow up appointments and return precautions with the patient and any family or support people present. They expressed understanding and agreed with the plan, discharged home.   ____________________________________________   FINAL CLINICAL IMPRESSION(S) / ED DIAGNOSES   Final diagnoses:  Confusion  Hyperthyroidism      MEDICATIONS GIVEN DURING THIS VISIT:  Medications - No data to display   ED Discharge Orders  None       Note:  This document was prepared using Dragon voice recognition software and may include unintentional dictation errors.   Misty , MD 05/17/19 [phone]

## 2019-05-17 NOTE — ED Triage Notes (Signed)
Arrived from home where she lives alone but has care givers majority of the time per daughter. Daughter reports patient has been confused and disoriented the last 3-4 days. 3 Falls in the last few days. Left leg amputation in the end of September 2020.

## 2019-05-18 LAB — SARS CORONAVIRUS 2 (TAT 6-24 HRS): SARS Coronavirus 2: NEGATIVE

## 2019-05-21 DIAGNOSIS — R29898 Other symptoms and signs involving the musculoskeletal system: Secondary | ICD-10-CM | POA: Diagnosis not present

## 2019-05-21 DIAGNOSIS — Z4789 Encounter for other orthopedic aftercare: Secondary | ICD-10-CM | POA: Diagnosis not present

## 2019-05-21 DIAGNOSIS — R269 Unspecified abnormalities of gait and mobility: Secondary | ICD-10-CM | POA: Diagnosis not present

## 2019-06-06 ENCOUNTER — Other Ambulatory Visit: Payer: Self-pay | Admitting: Physician Assistant

## 2019-06-06 DIAGNOSIS — E042 Nontoxic multinodular goiter: Secondary | ICD-10-CM | POA: Diagnosis not present

## 2019-06-06 DIAGNOSIS — I10 Essential (primary) hypertension: Secondary | ICD-10-CM

## 2019-06-06 DIAGNOSIS — E0590 Thyrotoxicosis, unspecified without thyrotoxic crisis or storm: Secondary | ICD-10-CM | POA: Diagnosis not present

## 2019-06-19 DIAGNOSIS — Z85828 Personal history of other malignant neoplasm of skin: Secondary | ICD-10-CM | POA: Diagnosis not present

## 2019-06-19 DIAGNOSIS — Z08 Encounter for follow-up examination after completed treatment for malignant neoplasm: Secondary | ICD-10-CM | POA: Diagnosis not present

## 2019-06-19 DIAGNOSIS — L239 Allergic contact dermatitis, unspecified cause: Secondary | ICD-10-CM | POA: Diagnosis not present

## 2019-06-28 DIAGNOSIS — L239 Allergic contact dermatitis, unspecified cause: Secondary | ICD-10-CM | POA: Diagnosis not present

## 2019-06-29 DIAGNOSIS — E042 Nontoxic multinodular goiter: Secondary | ICD-10-CM | POA: Diagnosis not present

## 2019-07-06 ENCOUNTER — Other Ambulatory Visit: Payer: Self-pay

## 2019-07-06 ENCOUNTER — Ambulatory Visit (INDEPENDENT_AMBULATORY_CARE_PROVIDER_SITE_OTHER): Payer: PPO | Admitting: Family Medicine

## 2019-07-06 ENCOUNTER — Encounter: Payer: Self-pay | Admitting: Family Medicine

## 2019-07-06 VITALS — BP 157/74 | HR 80

## 2019-07-06 DIAGNOSIS — R220 Localized swelling, mass and lump, head: Secondary | ICD-10-CM | POA: Diagnosis not present

## 2019-07-06 DIAGNOSIS — N3090 Cystitis, unspecified without hematuria: Secondary | ICD-10-CM

## 2019-07-06 LAB — POCT URINALYSIS DIPSTICK
Bilirubin, UA: NEGATIVE
Glucose, UA: NEGATIVE
Ketones, UA: NEGATIVE
Leukocytes, UA: NEGATIVE
Nitrite, UA: NEGATIVE
Protein, UA: NEGATIVE
Spec Grav, UA: 1.01 (ref 1.010–1.025)
Urobilinogen, UA: 0.2 E.U./dL
pH, UA: 7.5 (ref 5.0–8.0)

## 2019-07-06 MED ORDER — SULFAMETHOXAZOLE-TRIMETHOPRIM 800-160 MG PO TABS: 1.0000 | ORAL_TABLET | Freq: Two times a day (BID) | ORAL | 0 refills | Status: AC

## 2019-07-06 NOTE — Progress Notes (Signed)
Patient: Misty Page Female    DOB: 11-28-1940   79 y.o.   MRN: [id number] Visit Date: 07/06/2019  Today's Provider: Lavon Paganini, MD   Chief Complaint  Patient presents with  . Urinary Tract Infection   Subjective:     Urinary Tract Infection  This is a recurrent problem. The current episode started 1 to 4 weeks ago. The problem has been gradually worsening. She is not sexually active. There is no history of pyelonephritis. Associated symptoms include frequency and urgency. Pertinent negatives include no chills, flank pain, hematuria or vomiting. She has tried increased fluids and home medications for the symptoms.   Nocturia x3-4  Feels like previous UTIs  Seeing Derm for lip swelling.  Using a cream that is working.  Question was raised about a BP medication.  Allergies  Allergen Reactions  . Amoxicillin Anaphylaxis  . Naproxen Hives, Shortness Of Breath, Nausea And Vomiting and Swelling  . Penicillins Shortness Of Breath, Swelling and Rash    Has patient had a PCN reaction causing immediate rash, facial/tongue/throat swelling, SOB or lightheadedness with hypotension: Yes Has patient had a PCN reaction causing severe rash involving mucus membranes or skin necrosis: Yes Has patient had a PCN reaction that required hospitalization: Yes Has patient had a PCN reaction occurring within the last 10 years: No  If all of the above answers are "NO", then may proceed with Cephalosporin use.   . Codeine Nausea And Vomiting  . Levofloxacin Nausea And Vomiting  . Shellfish Allergy Hives, Diarrhea, Nausea And Vomiting and Swelling     Current Outpatient Medications:  .  amLODipine (NORVASC) 5 MG tablet, Take 1 tablet (5 mg total) by mouth daily., Disp: 30 tablet, Rfl: 3 .  Ascorbic Acid (VITAMIN C) 1000 MG tablet, Take 500 mg by mouth daily. , Disp: , Rfl:  .  B COMPLEX VITAMINS PO, Take 1 tablet by mouth daily. B COMPLEX (Oral Capsule)  1 Every Day for 0 days  Quantity:  0.00;  Refills: 0   Ordered :13-Apr-2010  Celene Kras, MA, Anastasiya ;  Started 29-October-2008 Active Comments: DX: 414.00, Disp: , Rfl:  .  Calcium-Vitamin D-Vitamin K 500-100-40 MG-UNT-MCG CHEW, Chew 1 tablet by mouth 2 (two) times daily. VIACTIV, 500-100-40 (Oral Tablet Chewable)  1 tablet twice a day for 0 days  Quantity: 0.00;  Refills: 0   Ordered :13-Apr-2010  Celene Kras, MA, Anastasiya ;  Started 29-October-2008 Active Comments: DX: 733.00, Disp: , Rfl:  .  clopidogrel (PLAVIX) 75 MG tablet, TAKE 1 TABLET BY MOUTH ONCE A DAY, Disp: 30 tablet, Rfl: 5 .  Cranberry 300 MG tablet, Take 300 mg by mouth daily. , Disp: , Rfl:  .  dicyclomine (BENTYL) 10 MG capsule, , Disp: , Rfl:  .  docusate sodium (COLACE) 100 MG capsule, Take 100 mg by mouth 2 (two) times daily., Disp: , Rfl:  .  ezetimibe (ZETIA) 10 MG tablet, , Disp: , Rfl:  .  famotidine (PEPCID) 20 MG tablet, Take 1 tablet (20 mg total) by mouth 2 (two) times daily., Disp: 180 tablet, Rfl: 1 .  ferrous sulfate 325 (65 FE) MG EC tablet, Take 325 mg by mouth daily. FERROUS SULFATE, 325 (65 Fe)MG (Oral Tablet Delayed Release)  1 every day for 0 days  Quantity: 0.00;  Refills: 0   Ordered :13-Apr-2010  Celene Kras, MA, Anastasiya ;  Started 29-October-2008 Active Comments: DX: 285.9, Disp: , Rfl:  .  fexofenadine (ALLEGRA) 180 MG tablet, Take  180 mg by mouth at bedtime. , Disp: , Rfl:  .  fluticasone (FLONASE) 50 MCG/ACT nasal spray, USE 2 SPRAYS EACH NOSTRIL DAILY, Disp: 48 g, Rfl: 1 .  hyoscyamine (LEVSIN, ANASPAZ) 0.125 MG tablet, Take by mouth., Disp: , Rfl:  .  losartan (COZAAR) 50 MG tablet, Take 1 tablet (50 mg total) by mouth daily., Disp: 90 tablet, Rfl: 3 .  Mesalamine (ASACOL HD) 800 MG TBEC, TAKE 2 TABLETS BY MOUTH TWICE A DAY., Disp: , Rfl:  .  methimazole (TAPAZOLE) 5 MG tablet, Take 2.5 mg by mouth. , Disp: , Rfl:  .  montelukast (SINGULAIR) 10 MG tablet, Take 1 tablet (10 mg total) by mouth at bedtime., Disp: 90 tablet, Rfl: 1 .   Multiple Vitamins-Minerals (CENTRUM SILVER PO), Take 1 capsule by mouth daily., Disp: , Rfl:  .  OxyCODONE HCl, Abuse Deter, (OXAYDO) 5 MG TABA, Take by mouth., Disp: , Rfl:  .  rosuvastatin (CRESTOR) 20 MG tablet, Take 0.5 tablets (10 mg total) by mouth daily., Disp: 45 tablet, Rfl: 3  Review of Systems  Constitutional: Positive for fatigue. Negative for activity change, appetite change, chills, diaphoresis, fever and unexpected weight change.  Gastrointestinal: Negative.  Negative for vomiting.  Genitourinary: Positive for frequency, pelvic pain and urgency. Negative for decreased urine volume, difficulty urinating, dyspareunia, enuresis, flank pain, genital sores, hematuria, vaginal bleeding, vaginal discharge and vaginal pain.  Neurological: Negative for dizziness, light-headedness and headaches.    Social History   Tobacco Use  . Smoking status: Former Smoker    Quit date: 05/30/1994    Years since quitting: 25.1  . Smokeless tobacco: Never Used  Substance Use Topics  . Alcohol use: Yes    Comment: rare      Objective:   There were no vitals taken for this visit. There were no vitals filed for this visit.There is no height or weight on file to calculate BMI.   Physical Exam Vitals reviewed.  Constitutional:      General: She is not in acute distress.    Appearance: Normal appearance. She is not diaphoretic.  HENT:     Head: Normocephalic and atraumatic.  Eyes:     General: No scleral icterus.    Conjunctiva/sclera: Conjunctivae normal.  Cardiovascular:     Rate and Rhythm: Normal rate and regular rhythm.     Heart sounds: Murmur present.  Pulmonary:     Effort: Pulmonary effort is normal. No respiratory distress.     Breath sounds: Normal breath sounds. No wheezing or rhonchi.  Abdominal:     General: There is no distension.     Palpations: Abdomen is soft.     Tenderness: There is no abdominal tenderness. There is no right CVA tenderness or left CVA tenderness.    Musculoskeletal:     Right lower leg: No edema.  Skin:    Comments: Some redness and rash around lips with some mild swelling  Neurological:     Mental Status: She is alert and oriented to person, place, and time.  Psychiatric:        Mood and Affect: Mood normal.        Behavior: Behavior normal.      No results found for any visits on 07/06/19.     Assessment & Plan    1. Cystitis - Symptoms consistent with UTI - UA relatively benign, but she has the same symptoms as previous UTIs -No systemic symptoms or signs of pyelonephritis - Given hematuria, will send  urine micro to confirm and will plan to recheck urine in about 6 weeks after completion of antibiotics to ensure hematuria has cleared -Will start treatment with 5day course of Bactrim after reviewing previous urine culture -We will send urine culture to confirm sensitivities -Discussed return precautions  - CULTURE, URINE COMPREHENSIVE - Urinalysis, microscopic only  2. Lip swelling - looks to have some rash, so doubt angioedema - she is going to folow-up with Dermatology - if it recurs, would certainly stop losartan - discussed angioedema precautions   Meds ordered this encounter  Medications  . sulfamethoxazole-trimethoprim (BACTRIM DS) 800-160 MG tablet    Sig: Take 1 tablet by mouth 2 (two) times daily for 5 days.    Dispense:  10 tablet    Refill:  0     Return if symptoms worsen or fail to improve.   The entirety of the information documented in the History of Present Illness, Review of Systems and Physical Exam were personally obtained by me. Portions of this information were initially documented by Ashley Royalty, CMA and reviewed by me for thoroughness and accuracy.    Leeman Johnsey, Dionne Bucy, MD MPH [facility]

## 2019-07-06 NOTE — Patient Instructions (Signed)
Urinary Tract Infection, Adult A urinary tract infection (UTI) is an infection of any part of the urinary tract. The urinary tract includes:  The kidneys.  The ureters.  The bladder.  The urethra. These organs make, store, and get rid of pee (urine) in the body. What are the causes? This is caused by germs (bacteria) in your genital area. These germs grow and cause swelling (inflammation) of your urinary tract. What increases the risk? You are more likely to develop this condition if:  You have a small, thin tube (catheter) to drain pee.  You cannot control when you pee or poop (incontinence).  You are female, and: ? You use these methods to prevent pregnancy:  A medicine that kills sperm (spermicide).  A device that blocks sperm (diaphragm). ? You have low levels of a female hormone (estrogen). ? You are pregnant.  You have genes that add to your risk.  You are sexually active.  You take antibiotic medicines.  You have trouble peeing because of: ? A prostate that is bigger than normal, if you are female. ? A blockage in the part of your body that drains pee from the bladder (urethra). ? A kidney stone. ? A nerve condition that affects your bladder (neurogenic bladder). ? Not getting enough to drink. ? Not peeing often enough.  You have other conditions, such as: ? Diabetes. ? A weak disease-fighting system (immune system). ? Sickle cell disease. ? Gout. ? Injury of the spine. What are the signs or symptoms? Symptoms of this condition include:  Needing to pee right away (urgently).  Peeing often.  Peeing small amounts often.  Pain or burning when peeing.  Blood in the pee.  Pee that smells bad or not like normal.  Trouble peeing.  Pee that is cloudy.  Fluid coming from the vagina, if you are female.  Pain in the belly or lower back. Other symptoms include:  Throwing up (vomiting).  No urge to eat.  Feeling mixed up (confused).  Being tired  and grouchy (irritable).  A fever.  Watery poop (diarrhea). How is this treated? This condition may be treated with:  Antibiotic medicine.  Other medicines.  Drinking enough water. Follow these instructions at home:  Medicines  Take over-the-counter and prescription medicines only as told by your doctor.  If you were prescribed an antibiotic medicine, take it as told by your doctor. Do not stop taking it even if you start to feel better. General instructions  Make sure you: ? Pee until your bladder is empty. ? Do not hold pee for a long time. ? Empty your bladder after sex. ? Wipe from front to back after pooping if you are a female. Use each tissue one time when you wipe.  Drink enough fluid to keep your pee pale yellow.  Keep all follow-up visits as told by your doctor. This is important. Contact a doctor if:  You do not get better after 1-2 days.  Your symptoms go away and then come back. Get help right away if:  You have very bad back pain.  You have very bad pain in your lower belly.  You have a fever.  You are sick to your stomach (nauseous).  You are throwing up. Summary  A urinary tract infection (UTI) is an infection of any part of the urinary tract.  This condition is caused by germs in your genital area.  There are many risk factors for a UTI. These include having a small, thin   tube to drain pee and not being able to control when you pee or poop.  Treatment includes antibiotic medicines for germs.  Drink enough fluid to keep your pee pale yellow. This information is not intended to replace advice given to you by your health care provider. Make sure you discuss any questions you have with your health care provider. Document Revised: 05/04/2018 Document Reviewed: 11/24/2017 Elsevier Patient Education  2020 [employer]  

## 2019-07-07 LAB — URINALYSIS, MICROSCOPIC ONLY

## 2019-07-07 LAB — SPECIMEN STATUS REPORT

## 2019-07-10 LAB — CULTURE, URINE COMPREHENSIVE

## 2019-07-10 LAB — SPECIMEN STATUS REPORT

## 2019-07-11 ENCOUNTER — Telehealth: Payer: Self-pay

## 2019-07-11 MED ORDER — NITROFURANTOIN MONOHYD MACRO 100 MG PO CAPS: 100.0000 mg | ORAL_CAPSULE | Freq: Two times a day (BID) | ORAL | 0 refills | Status: AC

## 2019-07-11 NOTE — Telephone Encounter (Signed)
Patient advised as below.  

## 2019-07-11 NOTE — Telephone Encounter (Signed)
-----   Message from Virginia Crews, MD sent at 07/11/2019  9:40 AM EST ----- Urine culture confirms UTI that is sensitive to abx prescribed.  There is also a very small amount of another bacteria that would not have responded to abx prescribed.  Unclear if this small amount is even clinically relevant.  If she is feeling improved, we can finish abx as prescribed, but if symptoms remain, we could switch to Macrobid.  Hope she is doing better.

## 2019-07-11 NOTE — Telephone Encounter (Signed)
Patient states she is still having some symptoms and would like for you to send in the other antibiotic.  She has already finished the first one.

## 2019-07-11 NOTE — Addendum Note (Signed)
Addended by: Virginia Crews on: 07/11/2019 12:16 PM   Modules accepted: Orders

## 2019-07-20 DIAGNOSIS — M75111 Incomplete rotator cuff tear or rupture of right shoulder, not specified as traumatic: Secondary | ICD-10-CM | POA: Diagnosis not present

## 2019-07-20 DIAGNOSIS — M7581 Other shoulder lesions, right shoulder: Secondary | ICD-10-CM | POA: Diagnosis not present

## 2019-09-05 DIAGNOSIS — Z89622 Acquired absence of left hip joint: Secondary | ICD-10-CM | POA: Diagnosis not present

## 2019-09-05 DIAGNOSIS — Z7409 Other reduced mobility: Secondary | ICD-10-CM | POA: Diagnosis not present

## 2019-09-10 ENCOUNTER — Other Ambulatory Visit: Payer: Self-pay | Admitting: Family Medicine

## 2019-09-10 NOTE — Telephone Encounter (Signed)
Requested Prescriptions  Pending Prescriptions Disp Refills  . amLODipine (NORVASC) 5 MG tablet [Pharmacy Med Name: AMLODIPINE BESYLATE 5 MG TAB] 60 tablet 0    Sig: TAKE 1 TABLET BY MOUTH DAILY     Cardiovascular:  Calcium Channel Blockers Failed - 09/10/2019 12:54 PM      Failed - Last BP in normal range    BP Readings from Last 1 Encounters:  07/06/19 (!) 157/74         Passed - Valid encounter within last 6 months    Recent Outpatient Visits          2 months ago [facility] [place], Dionne Bucy, MD   3 months ago Delirium   [facility] [place], Dionne Bucy, MD   4 months ago Essential (primary) hypertension   [facility], Dionne Bucy, MD   9 months ago Essential (primary) hypertension   [facility], Dionne Bucy, MD   9 months ago Hypokalemia   [facility], Dionne Bucy, MD

## 2019-09-25 DIAGNOSIS — I359 Nonrheumatic aortic valve disorder, unspecified: Secondary | ICD-10-CM | POA: Diagnosis not present

## 2019-09-25 DIAGNOSIS — D649 Anemia, unspecified: Secondary | ICD-10-CM | POA: Diagnosis not present

## 2019-09-25 DIAGNOSIS — R918 Other nonspecific abnormal finding of lung field: Secondary | ICD-10-CM | POA: Diagnosis not present

## 2019-09-25 DIAGNOSIS — C4922 Malignant neoplasm of connective and soft tissue of left lower limb, including hip: Secondary | ICD-10-CM | POA: Diagnosis not present

## 2019-09-25 DIAGNOSIS — N200 Calculus of kidney: Secondary | ICD-10-CM | POA: Diagnosis not present

## 2019-09-26 ENCOUNTER — Telehealth: Payer: Self-pay

## 2019-09-26 NOTE — Telephone Encounter (Signed)
Scheduled telephonic AWV for 10/08/19 @ 9:00 AM.

## 2019-09-26 NOTE — Telephone Encounter (Signed)
LMTCB to schedule telephonic AWV prior to CPE on 10/22/19.

## 2019-10-04 NOTE — Progress Notes (Signed)
Subjective:   Misty Page is a 79 y.o. female who presents for Medicare Annual (Subsequent) preventive examination.    This visit is being conducted via phone call  - after an attmept to do on video chat - due to the COVID-19 pandemic. This patient has given me verbal consent via phone to conduct this visit, patient states they are participating from their home address. Some vital signs may be absent or patient reported.    Patient identification: identified by name, DOB, and current address.  Review of Systems:  N/A  Cardiac Risk Factors include: advanced age (>82mn, >>76women);dyslipidemia;hypertension     Objective:     Vitals: There were no vitals taken for this visit.  There is no height or weight on file to calculate BMI. Unable to obtain vitals due to visit being conducted via telephonically.   Advanced Directives 10/08/2019 05/17/2019 03/23/2019 09/22/2018 06/29/2018 05/16/2018 02/08/2018  Does Patient Have a Medical Advance Directive? Yes Yes No No Yes Yes Yes  Type of [place] will Living will - - - [place] -  Does patient want to make changes to medical advance directive? - - - - - Yes (Inpatient - patient requests chaplain consult to change a medical advance directive) -  Copy of [place] Chart? No - copy requested - - - - No - copy requested -  Would patient like information on creating a medical advance directive? - - No - Patient declined No - Patient declined - - -    Tobacco Social History   Tobacco Use  Smoking Status Former Smoker  . Quit date: 05/30/1994  . Years since quitting: 25.3  Smokeless Tobacco Never Used     Counseling given: Not Answered   Clinical Intake:  Pre-visit preparation completed: Yes  Pain : No/denies pain Pain Score: 0-No pain     Nutritional Risks: None Diabetes: No  How often do you need to have someone help you when you  read instructions, pamphlets, or other written materials from your doctor or pharmacy?: 1 - Never  Interpreter Needed?: No  Information [facility] by :: [facility] LPN  Past Medical History:  Diagnosis Date  . Allergy   . Anemia   . Arthritis   . CA of skin 10/15/2014  . Calculus of kidney 10/15/2014   Seen in ER 06/16/10.   . Colitis   . Complication of anesthesia    nausea  . Complication of internal prosthetic device 01/27/2009  . Diverticulitis   . Environmental and seasonal allergies   . Fibrocystic breast disease (FCBD)   . GERD (gastroesophageal reflux disease)   . High cholesterol   . History of heart artery stent   . Hyperlipidemia   . Hypertension   . Myocardial infarction ([place]   . Osteomyelitis of left femur ([place]ivana [place] 11/30/2018  . Osteoporosis   . Thyroid disease   . TIA (transient ischemic attack)    Past Surgical History:  Procedure Laterality Date  . ABDOMINAL HYSTERECTOMY  2007  . BREAST ENHANCEMENT SURGERY  2004  . BREAST IMPLANT REMOVAL    . BREAST SURGERY  1984   Fibrocystic Disease  . CAROTID ENDARTERECTOMY Left 06/21/2014   Dr. SDelana Meyer . CAROTID ENDARTERECTOMY Right 08/02/2014   Dr. SDelana Meyer . CAROTID PTA/STENT INTERVENTION N/A 06/15/2017   Procedure: CAROTID PTA/STENT INTERVENTION;  Surgeon: SKatha Cabal MD;  [place];  Service: Cardiovascular;  Laterality:  N/A;  . COLONOSCOPY WITH PROPOFOL N/A 07/14/2015   Procedure: COLONOSCOPY WITH PROPOFOL;  Surgeon: Hulen Luster, MD;  Location: [facility] ENDOSCOPY;  Service: Gastroenterology;  Laterality: N/A;  . COLONOSCOPY WITH PROPOFOL N/A 02/08/2018   Procedure: COLONOSCOPY WITH PROPOFOL;  Surgeon: Toledo, Benay Pike, MD;  Location: [facility] ENDOSCOPY;  Service: Gastroenterology;  Laterality: N/A;  . CORONARY ANGIOPLASTY WITH STENT PLACEMENT  1999  . ESOPHAGOGASTRODUODENOSCOPY (EGD) WITH PROPOFOL N/A 02/08/2018   Procedure: ESOPHAGOGASTRODUODENOSCOPY (EGD) WITH PROPOFOL;  Surgeon: Toledo, Benay Pike,  MD;  Location: [facility] ENDOSCOPY;  Service: Gastroenterology;  Laterality: N/A;  . FEMORAL ARTERY STENT  08/2003  . HERNIA REPAIR  2008  . LEG AMPUTATION AT HIP Left 02/28/2019  . RENAL ARTERY STENT  08/2003  . TRANSUMBILICAL AUGMENTATION MAMMAPLASTY     Family History  Problem Relation Age of Onset  . Hyperlipidemia Mother   . Congestive Heart Failure Mother   . COPD Mother   . Heart disease Mother   . Hypertension Mother   . Osteoporosis Mother   . Pancreatic cancer Father   . Arthritis Sister   . Alzheimer's disease Sister   . Hypertension Sister   . Prostate cancer Brother   . Heart attack Brother   . Stomach cancer Brother   . Kidney failure Brother   . Leukemia Brother   . Emphysema Brother   . Pancreatic cancer Brother   . Stomach cancer Brother   . Breast cancer Paternal Grandmother   . Leukemia Paternal Grandfather   . Heart attack Maternal Uncle   . Stroke Maternal Aunt    Social History   Socioeconomic History  . Marital status: Widowed    Spouse name: Konrad Dolores  . Number of children: 1  . Years of education: Not on file  . Highest education level: 12th grade  Occupational History  . Occupation: Emergency planning/management officer    Comment: retired  Tobacco Use  . Smoking status: Former Smoker    Quit date: 05/30/1994    Years since quitting: 25.3  . Smokeless tobacco: Never Used  Substance and Sexual Activity  . Alcohol use: Yes    Comment: rare- 1 glass of wine EOW  . Drug use: No  . Sexual activity: Never  Other Topics Concern  . Not on file  Social History Narrative  . Not on file   Social Determinants of Health   Financial Resource Strain: Low Risk   . Difficulty of Paying Living Expenses: Not hard at all  Food Insecurity: No Food Insecurity  . Worried About Charity fundraiser in the Last Year: Never true  . Ran Out of Food in the Last Year: Never true  Transportation Needs: No Transportation Needs  . Lack of Transportation (Medical): No  . Lack of  Transportation (Non-Medical): No  Physical Activity: Inactive  . Days of Exercise per Week: 0 days  . Minutes of Exercise per Session: 0 min  Stress: No Stress Concern Present  . Feeling of Stress : Not at all  Social Connections: Moderately Isolated  . Frequency of Communication with Friends and Family: More than three times a week  . Frequency of Social Gatherings with Friends and Family: Three times a week  . Attends Religious Services: Never  . Active Member of Clubs or Organizations: No  . Attends Archivist Meetings: Never  . Marital Status: Widowed    Outpatient Encounter Medications as of 10/08/2019  Medication Sig  . acetaminophen (TYLENOL) 500 MG tablet Take 500 mg  by mouth every 6 (six) hours as needed.  Marland Kitchen amLODipine (NORVASC) 5 MG tablet TAKE 1 TABLET BY MOUTH DAILY  . Ascorbic Acid (VITAMIN C) 1000 MG tablet Take 500 mg by mouth daily.   . B COMPLEX VITAMINS PO Take 1 tablet by mouth daily. B COMPLEX (Oral Capsule)  1 Every Day for 0 days  Quantity: 0.00;  Refills: 0   Ordered :13-Apr-2010  Celene Kras, MA, Anastasiya ;  Started 29-October-2008 Active Comments: DX: 414.00  . Calcium-Vitamin D-Vitamin K 500-100-40 MG-UNT-MCG CHEW Chew 1 tablet by mouth 2 (two) times daily. VIACTIV, 500-100-40 (Oral Tablet Chewable)  1 tablet twice a day for 0 days  Quantity: 0.00;  Refills: 0   Ordered :13-Apr-2010  Celene Kras, MA, Anastasiya ;  Started 29-October-2008 Active Comments: DX: 733.00  . clopidogrel (PLAVIX) 75 MG tablet TAKE 1 TABLET BY MOUTH ONCE A DAY  . Cranberry 300 MG tablet Take 300 mg by mouth daily.   Marland Kitchen ezetimibe (ZETIA) 10 MG tablet   . famotidine (PEPCID) 20 MG tablet Take 1 tablet (20 mg total) by mouth 2 (two) times daily.  . ferrous sulfate 325 (65 FE) MG EC tablet Take 325 mg by mouth daily. FERROUS SULFATE, 325 (65 Fe)MG (Oral Tablet Delayed Release)  1 every day for 0 days  Quantity: 0.00;  Refills: 0   Ordered :13-Apr-2010  Celene Kras, MA, Anastasiya ;   Started 29-October-2008 Active Comments: DX: 285.9  . losartan (COZAAR) 50 MG tablet Take 1 tablet (50 mg total) by mouth daily.  . Mesalamine (ASACOL HD) 800 MG TBEC TAKE 2 TABLETS BY MOUTH TWICE A DAY.  . methimazole (TAPAZOLE) 5 MG tablet Take 2.5 mg by mouth daily.   . rosuvastatin (CRESTOR) 20 MG tablet Take 0.5 tablets (10 mg total) by mouth daily.  Marland Kitchen dicyclomine (BENTYL) 10 MG capsule   . docusate sodium (COLACE) 100 MG capsule Take 100 mg by mouth 2 (two) times daily.  . fexofenadine (ALLEGRA) 180 MG tablet Take 180 mg by mouth at bedtime.   . fluticasone (FLONASE) 50 MCG/ACT nasal spray USE 2 SPRAYS EACH NOSTRIL DAILY (Patient not taking: Reported on 10/08/2019)  . hyoscyamine (LEVSIN, ANASPAZ) 0.125 MG tablet Take by mouth.  . montelukast (SINGULAIR) 10 MG tablet Take 1 tablet (10 mg total) by mouth at bedtime. (Patient not taking: Reported on 10/08/2019)  . Multiple Vitamins-Minerals (CENTRUM SILVER PO) Take 1 capsule by mouth daily.  . OxyCODONE HCl, Abuse Deter, (OXAYDO) 5 MG TABA Take by mouth.   No facility-administered encounter medications on file as of 10/08/2019.    Activities of Daily Living In your present state of health, do you have any difficulty performing the following activities: 10/08/2019  Hearing? N  Vision? N  Difficulty concentrating or making decisions? N  Walking or climbing stairs? Y  Comment Due to left leg amputation.  Dressing or bathing? N  Doing errands, shopping? Y  Comment Does not drive.  Preparing Food and eating ? N  Using the Toilet? N  In the past six months, have you accidently leaked urine? N  Do you have problems with loss of bowel control? N  Managing your Medications? N  Managing your Finances? N  Housekeeping or managing your Housekeeping? Y  Comment Has assistance 3 times a week with housework.  Some recent data might be hidden    Patient Care Team: Virginia Crews, MD as PCP - General (Family Medicine) Schnier, Dolores Lory, MD  as Consulting Physician (Vascular Surgery) Corey Skains,  MD as Consulting Physician (Cardiology) Lonia Farber, MD as Consulting Physician (Internal Medicine) Poggi, Marshall Cork, MD as Consulting Physician (Surgery) Bertram Gala as Physician Assistant (Gastroenterology) Rubye Beach as Physician Assistant (Family Medicine) Reva Bores, MD as Referring Physician (Hematology and Oncology) Magda Bernheim, MD as Referring Physician (Orthopedic Surgery) Dasher, Rayvon Char, MD (Dermatology)    Assessment:   This is a routine wellness examination for [place].  Exercise Activities and Dietary recommendations Current Exercise Habits: The patient does not participate in regular exercise at present, Exercise limited by: Other - see comments(Left leg amputation)  Goals    . DIET - INCREASE WATER INTAKE     Recommend increasing water intake to 4 glasses of water a day.     Marland Kitchen LIFESTYLE - DECREASE FALLS RISK     Recommend to remove any items from the home that may cause slips or trips.       Fall Risk: Fall Risk  10/08/2019 04/25/2019 08/15/2017 06/21/2016 06/10/2015  Falls in the past year? 1 1 Yes Yes No  Comment - Emmi Telephone Survey: data to providers prior to load - - -  Number falls in past yr: 1 1 2  or more 1 -  Comment - Emmi Telephone Survey Actual Response = 3 fell due to snow - -  Injury with Fall? 0 1 No No -  Risk for fall due to : Impaired balance/gait;Impaired mobility;Other (Comment) - - - -  Risk for fall due to: Comment Due to amputation of left leg. - - - -  Follow up Falls prevention discussed - Falls prevention discussed - -    FALL RISK PREVENTION PERTAINING TO THE HOME:  Any stairs in or around the home? No  If so, are there any without handrails? N/A  Home free of loose throw rugs in walkways, pet beds, electrical cords, etc? Yes  Adequate lighting in your home to reduce risk of falls? Yes   ASSISTIVE DEVICES UTILIZED TO PREVENT  FALLS:  Life alert? Yes  Use of a cane, walker or w/c? Yes  Grab bars in the bathroom? Yes  Shower chair or bench in shower? Yes  Elevated toilet seat or a handicapped toilet? Yes    TIMED UP AND GO:  Was the test performed? No .    Depression Screen PHQ 2/9 Scores 10/08/2019 09/21/2018 08/15/2017 08/15/2017  PHQ - 2 Score 1 1 0 0  PHQ- 9 Score - - 1 -     Cognitive Function MMSE - Mini Mental State Exam 05/09/2019  Orientation to time 3  Orientation to Place 5  Registration 3  Attention/ Calculation 3  Recall 2  Language- name 2 objects 2  Language- repeat 1  Language- follow 3 step command 3  Language- read & follow direction 1  Write a sentence 1  Copy design 0  Total score 24        Immunization History  Administered Date(s) Administered  . Influenza Split 04/19/2011  . Influenza, High Dose Seasonal PF 02/18/2014, 03/12/2016, 03/07/2019  . Influenza,inj,Quad PF,6+ Mos 02/10/2013, 03/07/2017, 03/24/2018  . Influenza-Unspecified 02/10/2013, 03/01/2015  . Pneumococcal Conjugate-13 05/18/2013  . Pneumococcal Polysaccharide-23 03/02/2004, 08/15/2017  . Td 02/08/2005, 11/04/2017  . Zoster 01/29/2006    Qualifies for Shingles Vaccine? Yes  Zostavax completed 01/29/06. Due for Shingrix. Pt has been advised to call insurance company to determine out of pocket expense. Advised may also receive vaccine at local pharmacy or Health Dept. Verbalized acceptance and understanding.  Tdap: Up to date  Flu Vaccine: Up to date  Pneumococcal Vaccine: Completed series  Screening Tests Health Maintenance  Topic Date Due  . COVID-19 Vaccine (1) Never done  . INFLUENZA VACCINE  12/30/2019  . TETANUS/TDAP  11/05/2027  . DEXA SCAN  Completed  . PNA vac Low Risk Adult  Completed    Cancer Screenings:  Colorectal Screening: No longer required.   Mammogram: No longer required.   Bone Density: Completed 04/19/11. Previous DEXA scan was normal. No repeat needed unless advised  by a physician.  Lung Cancer Screening: (Low Dose CT Chest recommended if Age 69-80 years, 30 pack-year currently smoking OR have quit w/in 15years.) does not qualify.   Additional Screening:  Vision Screening: Recommended annual ophthalmology exams for early detection of glaucoma and other disorders of the eye.  Dental Screening: Recommended annual dental exams for proper oral hygiene  Community Resource Referral:  CRR required this visit?  No       Plan:  I have personally reviewed and addressed the Medicare Annual Wellness questionnaire and have noted the following in the patient's chart:  A. Medical and social history B. Use of alcohol, tobacco or illicit drugs  C. Current medications and supplements D. Functional ability and status E.  Nutritional status F.  Physical activity G. Advance directives H. List of other physicians I.  Hospitalizations, surgeries, and ER visits in previous 12 months J.  [place] such as hearing and vision if needed, cognitive and depression L. Referrals and appointments   In addition, I have reviewed and discussed with patient certain preventive protocols, quality metrics, and best practice recommendations. A written personalized care plan for preventive services as well as general preventive health recommendations were provided to patient. Nurse Health Advisor  Signed,    Elsia Lasota [place], [place]  4/78/2956 Nurse Health Advisor   Nurse Notes: None.

## 2019-10-05 DIAGNOSIS — Z7409 Other reduced mobility: Secondary | ICD-10-CM | POA: Diagnosis not present

## 2019-10-05 DIAGNOSIS — Z89622 Acquired absence of left hip joint: Secondary | ICD-10-CM | POA: Diagnosis not present

## 2019-10-08 ENCOUNTER — Other Ambulatory Visit: Payer: Self-pay

## 2019-10-08 ENCOUNTER — Ambulatory Visit (INDEPENDENT_AMBULATORY_CARE_PROVIDER_SITE_OTHER): Payer: PPO

## 2019-10-08 DIAGNOSIS — Z0000 Encounter for general adult medical examination without abnormal findings: Secondary | ICD-10-CM | POA: Diagnosis not present

## 2019-10-08 NOTE — Patient Instructions (Signed)
[name] , Thank you for taking time to come for your Medicare Wellness Visit. I appreciate your ongoing commitment to your health goals. Please review the following plan we discussed and let me know if I can assist you in the future.   Screening recommendations/referrals: Colonoscopy: No longer required.  Mammogram: No longer required.  Bone Density: Up to date. Previous DEXA scan was normal. No repeat needed unless advised by a physician. Recommended yearly ophthalmology/optometry visit for glaucoma screening and checkup Recommended yearly dental visit for hygiene and checkup  Vaccinations: Influenza vaccine: Up to date Pneumococcal vaccine: Completed series Tdap vaccine: Up to date Shingles vaccine: Pt declines today.     Advanced directives: Please bring a copy of your POA (Power of Attorney) and/or Living Will to your next appointment.   Conditions/risks identified: Fall risk prevention discussed today. Recommend to continue to increase water intake to 6-8 8 oz glasses a day.   Next appointment: 10/22/19 @ 10:40 AM with Dr Brita Romp. Declined scheduling an AWV for 2022 at this time.    Preventive Care 60 Years and Older, Female Preventive care refers to lifestyle choices and visits with your health care provider that can promote health and wellness. What does preventive care include?  A yearly physical exam. This is also called an annual well check.  Dental exams once or twice a year.  Routine eye exams. Ask your health care provider how often you should have your eyes checked.  Personal lifestyle choices, including:  Daily care of your teeth and gums.  Regular physical activity.  Eating a healthy diet.  Avoiding tobacco and drug use.  Limiting alcohol use.  Practicing safe sex.  Taking low-dose aspirin every day.  Taking vitamin and mineral supplements as recommended by your health care provider. What happens during an annual well check? The services and  screenings done by your health care provider during your annual well check will depend on your age, overall health, lifestyle risk factors, and family history of disease. Counseling  Your health care provider may ask you questions about your:  Alcohol use.  Tobacco use.  Drug use.  Emotional well-being.  Home and relationship well-being.  Sexual activity.  Eating habits.  History of falls.  Memory and ability to understand (cognition).  Work and work Statistician.  Reproductive health. Screening  You may have the following tests or measurements:  Height, weight, and BMI.  Blood pressure.  Lipid and cholesterol levels. These may be checked every 5 years, or more frequently if you are over 79 years old.  Skin check.  Lung cancer screening. You may have this screening every year starting at age 50 if you have a 30-pack-year history of smoking and currently smoke or have quit within the past 15 years.  Fecal occult blood test (FOBT) of the stool. You may have this test every year starting at age 73.  Flexible sigmoidoscopy or colonoscopy. You may have a sigmoidoscopy every 5 years or a colonoscopy every 10 years starting at age 25.  Hepatitis C blood test.  Hepatitis B blood test.  Sexually transmitted disease (STD) testing.  Diabetes screening. This is done by checking your blood sugar (glucose) after you have not eaten for a while (fasting). You may have this done every 1-3 years.  Bone density scan. This is done to screen for osteoporosis. You may have this done starting at age 67.  Mammogram. This may be done every 1-2 years. Talk to your health care provider about how  often you should have regular mammograms. Talk with your health care provider about your test results, treatment options, and if necessary, the need for more tests. Vaccines  Your health care provider may recommend certain vaccines, such as:  Influenza vaccine. This is recommended every  year.  Tetanus, diphtheria, and acellular pertussis (Tdap, Td) vaccine. You may need a Td booster every 10 years.  Zoster vaccine. You may need this after age 3.  Pneumococcal 13-valent conjugate (PCV13) vaccine. One dose is recommended after age 64.  Pneumococcal polysaccharide (PPSV23) vaccine. One dose is recommended after age 89. Talk to your health care provider about which screenings and vaccines you need and how often you need them. This information is not intended to replace advice given to you by your health care provider. Make sure you discuss any questions you have with your health care provider. Document Released: 06/13/2015 Document Revised: 02/04/2016 Document Reviewed: 03/18/2015 Elsevier Interactive Patient Education  2017 [place] Prevention in the Home Falls can cause injuries. They can happen to people of all ages. There are many things you can do to make your home safe and to help prevent falls. What can I do on the outside of my home?  Regularly fix the edges of walkways and driveways and fix any cracks.  Remove anything that might make you trip as you walk through a door, such as a raised step or threshold.  Trim any bushes or trees on the path to your home.  Use bright outdoor lighting.  Clear any walking paths of anything that might make someone trip, such as rocks or tools.  Regularly check to see if handrails are loose or broken. Make sure that both sides of any steps have handrails.  Any raised decks and porches should have guardrails on the edges.  Have any leaves, snow, or ice cleared regularly.  Use sand or salt on walking paths during winter.  Clean up any spills in your garage right away. This includes oil or grease spills. What can I do in the bathroom?  Use night lights.  Install grab bars by the toilet and in the tub and shower. Do not use towel bars as grab bars.  Use non-skid mats or decals in the tub or shower.  If you  need to sit down in the shower, use a plastic, non-slip stool.  Keep the floor dry. Clean up any water that spills on the floor as soon as it happens.  Remove soap buildup in the tub or shower regularly.  Attach bath mats securely with double-sided non-slip rug tape.  Do not have throw rugs and other things on the floor that can make you trip. What can I do in the bedroom?  Use night lights.  Make sure that you have a light by your bed that is easy to reach.  Do not use any sheets or blankets that are too big for your bed. They should not hang down onto the floor.  Have a firm chair that has side arms. You can use this for support while you get dressed.  Do not have throw rugs and other things on the floor that can make you trip. What can I do in the kitchen?  Clean up any spills right away.  Avoid walking on wet floors.  Keep items that you use a lot in easy-to-reach places.  If you need to reach something above you, use a strong step stool that has a grab bar.  Keep electrical  cords out of the way.  Do not use floor polish or wax that makes floors slippery. If you must use wax, use non-skid floor wax.  Do not have throw rugs and other things on the floor that can make you trip. What can I do with my stairs?  Do not leave any items on the stairs.  Make sure that there are handrails on both sides of the stairs and use them. Fix handrails that are broken or loose. Make sure that handrails are as long as the stairways.  Check any carpeting to make sure that it is firmly attached to the stairs. Fix any carpet that is loose or worn.  Avoid having throw rugs at the top or bottom of the stairs. If you do have throw rugs, attach them to the floor with carpet tape.  Make sure that you have a light switch at the top of the stairs and the bottom of the stairs. If you do not have them, ask someone to add them for you. What else can I do to help prevent falls?  Wear shoes  that:  Do not have high heels.  Have rubber bottoms.  Are comfortable and fit you well.  Are closed at the toe. Do not wear sandals.  If you use a stepladder:  Make sure that it is fully opened. Do not climb a closed stepladder.  Make sure that both sides of the stepladder are locked into place.  Ask someone to hold it for you, if possible.  Clearly mark and make sure that you can see:  Any grab bars or handrails.  First and last steps.  Where the edge of each step is.  Use tools that help you move around (mobility aids) if they are needed. These include:  Canes.  Walkers.  Scooters.  Crutches.  Turn on the lights when you go into a dark area. Replace any light bulbs as soon as they burn out.  Set up your furniture so you have a clear path. Avoid moving your furniture around.  If any of your floors are uneven, fix them.  If there are any pets around you, be aware of where they are.  Review your medicines with your doctor. Some medicines can make you feel dizzy. This can increase your chance of falling. Ask your doctor what other things that you can do to help prevent falls. This information is not intended to replace advice given to you by your health care provider. Make sure you discuss any questions you have with your health care provider. Document Released: 03/13/2009 Document Revised: 10/23/2015 Document Reviewed: 06/21/2014 Elsevier Interactive Patient Education  2017 [employer].

## 2019-10-09 ENCOUNTER — Other Ambulatory Visit: Payer: Self-pay | Admitting: Physician Assistant

## 2019-10-09 ENCOUNTER — Other Ambulatory Visit (INDEPENDENT_AMBULATORY_CARE_PROVIDER_SITE_OTHER): Payer: Self-pay | Admitting: Vascular Surgery

## 2019-10-09 DIAGNOSIS — I10 Essential (primary) hypertension: Secondary | ICD-10-CM

## 2019-10-09 NOTE — Telephone Encounter (Signed)
This pt hasn't been seen since 04/2018   but the RX was last refilled by [facility] on 4/12 of this year is this ok to refill.

## 2019-10-09 NOTE — Telephone Encounter (Signed)
Refill but she needs to scheduel a f/u with carotid for any further refills

## 2019-10-10 NOTE — Telephone Encounter (Signed)
I called the pt and left a voicemail making her aware of the above instructions per the NP Eulogio Ditch. Can you please call this pt an schedule her for the following.

## 2019-10-11 ENCOUNTER — Other Ambulatory Visit: Payer: Self-pay | Admitting: Family Medicine

## 2019-10-11 NOTE — Telephone Encounter (Signed)
Requested medication (s) are due for refill today:   Yes  Requested medication (s) are on the active medication list:   Yes  Future visit scheduled:   No   Last ordered: 09/06/2018 by a historical provider  Clinic note:  Returned because prescribed by a historical provider.   Requested Prescriptions  Pending Prescriptions Disp Refills   ezetimibe (ZETIA) 10 MG tablet [Pharmacy Med Name: EZETIMIBE 10 MG TAB] 30 tablet     Sig: TAKE 1 TABLET BY MOUTH DAILY      Cardiovascular:  Antilipid - Sterol Transport Inhibitors Failed - 10/11/2019  2:03 PM      Failed - LDL in normal range and within 360 days    LDL Chol Calc (NIH)  Date Value Ref Range Status  05/09/2019 112 (H) 0 - 99 mg/dL Final          Passed - Total Cholesterol in normal range and within 360 days    Cholesterol, Total  Date Value Ref Range Status  05/09/2019 186 100 - 199 mg/dL Final          Passed - HDL in normal range and within 360 days    HDL  Date Value Ref Range Status  05/09/2019 54 >39 mg/dL Final          Passed - Triglycerides in normal range and within 360 days    Triglycerides  Date Value Ref Range Status  05/09/2019 112 0 - 149 mg/dL Final          Passed - Valid encounter within last 12 months    Recent Outpatient Visits           3 months ago [place], Dionne Bucy, MD   4 months ago Delirium   [place], Dionne Bucy, MD   5 months ago Essential (primary) hypertension   [name], Dionne Bucy, MD   10 months ago Essential (primary) hypertension   [place], Dionne Bucy, MD   10 months ago Hypokalemia   [facility], Dionne Bucy, MD

## 2019-10-12 ENCOUNTER — Other Ambulatory Visit: Payer: Self-pay | Admitting: Family Medicine

## 2019-10-15 DIAGNOSIS — R936 Abnormal findings on diagnostic imaging of limbs: Secondary | ICD-10-CM | POA: Diagnosis not present

## 2019-10-15 DIAGNOSIS — R937 Abnormal findings on diagnostic imaging of other parts of musculoskeletal system: Secondary | ICD-10-CM | POA: Diagnosis not present

## 2019-10-15 DIAGNOSIS — C4922 Malignant neoplasm of connective and soft tissue of left lower limb, including hip: Secondary | ICD-10-CM | POA: Diagnosis not present

## 2019-10-19 DIAGNOSIS — M75111 Incomplete rotator cuff tear or rupture of right shoulder, not specified as traumatic: Secondary | ICD-10-CM | POA: Diagnosis not present

## 2019-10-19 DIAGNOSIS — M7581 Other shoulder lesions, right shoulder: Secondary | ICD-10-CM | POA: Diagnosis not present

## 2019-10-22 ENCOUNTER — Other Ambulatory Visit: Payer: Self-pay

## 2019-10-22 ENCOUNTER — Ambulatory Visit (INDEPENDENT_AMBULATORY_CARE_PROVIDER_SITE_OTHER): Payer: PPO | Admitting: Family Medicine

## 2019-10-22 ENCOUNTER — Encounter: Payer: Self-pay | Admitting: Family Medicine

## 2019-10-22 VITALS — BP 90/57 | HR 78 | Temp 96.9°F

## 2019-10-22 DIAGNOSIS — E0590 Thyrotoxicosis, unspecified without thyrotoxic crisis or storm: Secondary | ICD-10-CM | POA: Diagnosis not present

## 2019-10-22 DIAGNOSIS — K51911 Ulcerative colitis, unspecified with rectal bleeding: Secondary | ICD-10-CM

## 2019-10-22 DIAGNOSIS — Z89612 Acquired absence of left leg above knee: Secondary | ICD-10-CM

## 2019-10-22 DIAGNOSIS — I739 Peripheral vascular disease, unspecified: Secondary | ICD-10-CM | POA: Diagnosis not present

## 2019-10-22 DIAGNOSIS — E7800 Pure hypercholesterolemia, unspecified: Secondary | ICD-10-CM

## 2019-10-22 DIAGNOSIS — Z0000 Encounter for general adult medical examination without abnormal findings: Secondary | ICD-10-CM | POA: Diagnosis not present

## 2019-10-22 DIAGNOSIS — I10 Essential (primary) hypertension: Secondary | ICD-10-CM

## 2019-10-22 MED ORDER — FLUTICASONE PROPIONATE 50 MCG/ACT NA SUSP: NASAL | 3 refills | Status: DC

## 2019-10-22 MED ORDER — CLOPIDOGREL BISULFATE 75 MG PO TABS: 75.0000 mg | ORAL_TABLET | Freq: Every day | ORAL | 3 refills | Status: DC

## 2019-10-22 MED ORDER — AMLODIPINE BESYLATE 5 MG PO TABS: 5.0000 mg | ORAL_TABLET | Freq: Every day | ORAL | 3 refills | Status: DC

## 2019-10-22 MED ORDER — LOSARTAN POTASSIUM 50 MG PO TABS: 50.0000 mg | ORAL_TABLET | Freq: Every day | ORAL | 3 refills | Status: DC

## 2019-10-22 MED ORDER — EZETIMIBE 10 MG PO TABS: 10.0000 mg | ORAL_TABLET | Freq: Every day | ORAL | 3 refills | Status: DC

## 2019-10-22 MED ORDER — ROSUVASTATIN CALCIUM 20 MG PO TABS: 10.0000 mg | ORAL_TABLET | Freq: Every day | ORAL | 3 refills | Status: DC

## 2019-10-22 NOTE — Assessment & Plan Note (Signed)
Followed by GI Continue mesalamine No recent flares

## 2019-10-22 NOTE — Assessment & Plan Note (Addendum)
Low today Stop Losartan for 2 weeks, due to possible cause of rash Patient to check blood pressure at home bp should be less than 130/90 Check CMP Follow up in 6 months

## 2019-10-22 NOTE — Assessment & Plan Note (Signed)
Patient doing well on medications Continue Crestor and Zetia daily Recheck lipid and CMP Follow up in 6 months

## 2019-10-22 NOTE — Assessment & Plan Note (Signed)
Followed by endocrinology Check TSH  Continue current dose of medication

## 2019-10-22 NOTE — Assessment & Plan Note (Signed)
Followed [facility]b cardiology and VVS S/p b/l iliac and femoral stenting and L renal artery stent Continue plavix Discussed importance of HLD and HTN control

## 2019-10-22 NOTE — Progress Notes (Signed)
Complete physical exam   Patient: Misty Page   DOB: [birth year]   79 y.o. Female  MRN: [id number] Visit Date: 10/22/2019  I,Sulibeya S Dimas,acting as a scribe for Lavon Paganini, MD.,have documented all relevant documentation on the behalf of Lavon Paganini, MD,as directed by  Lavon Paganini, MD while in the presence of Lavon Paganini, MD   Today's healthcare provider: Lavon Paganini, MD   Chief Complaint  Patient presents with  . Annual Exam   Subjective    Misty Page is a 79 y.o. female who presents today for a complete physical exam.  She reports consuming a general and low sodium diet. The patient does not participate in regular exercise at present. She generally feels well. She reports sleeping well. She does not have additional problems to discuss today.  HPI  10/08/2019 AWV 02/08/2018 colonoscopy-mild divderticulosis  Past Medical History:  Diagnosis Date  . Allergy   . Anemia   . Arthritis   . CA of skin 10/15/2014  . Calculus of kidney 10/15/2014   Seen in ER 06/16/10.   . Colitis   . Complication of anesthesia    nausea  . Complication of internal prosthetic device 01/27/2009  . Diverticulitis   . Environmental and seasonal allergies   . Fibrocystic breast disease (FCBD)   . GERD (gastroesophageal reflux disease)   . High cholesterol   . History of heart artery stent   . Hyperlipidemia   . Hypertension   . Myocardial infarction ([place])   . Osteomyelitis of left femur ([place]) 11/30/2018  . Osteoporosis   . Thyroid disease   . TIA (transient ischemic attack)    Past Surgical History:  Procedure Laterality Date  . ABDOMINAL HYSTERECTOMY  2007  . BREAST ENHANCEMENT SURGERY  2004  . BREAST IMPLANT REMOVAL    . BREAST SURGERY  1984   Fibrocystic Disease  . CAROTID ENDARTERECTOMY Left 06/21/2014   Dr. Delana Meyer  . CAROTID ENDARTERECTOMY Right 08/02/2014   Dr. Delana Meyer  . CAROTID PTA/STENT INTERVENTION N/A 06/15/2017   Procedure: CAROTID  PTA/STENT INTERVENTION;  Surgeon: Katha Cabal, MD;  Location: [facility];  Service: Cardiovascular;  Laterality: N/A;  . COLONOSCOPY WITH PROPOFOL N/A 07/14/2015   Procedure: COLONOSCOPY WITH PROPOFOL;  Surgeon: Hulen Luster, MD;  Location: [facility] ENDOSCOPY;  Service: Gastroenterology;  Laterality: N/A;  . COLONOSCOPY WITH PROPOFOL N/A 02/08/2018   Procedure: COLONOSCOPY WITH PROPOFOL;  Surgeon: Toledo, Benay Pike, MD;  Location: [facility] ENDOSCOPY;  Service: Gastroenterology;  Laterality: N/A;  . CORONARY ANGIOPLASTY WITH STENT PLACEMENT  1999  . ESOPHAGOGASTRODUODENOSCOPY (EGD) WITH PROPOFOL N/A 02/08/2018   Procedure: ESOPHAGOGASTRODUODENOSCOPY (EGD) WITH PROPOFOL;  Surgeon: Toledo, Benay Pike, MD;  Location: [facility] ENDOSCOPY;  Service: Gastroenterology;  Laterality: N/A;  . FEMORAL ARTERY STENT  08/2003  . HERNIA REPAIR  2008  . LEG AMPUTATION AT HIP Left 02/28/2019  . RENAL ARTERY STENT  08/2003  . TRANSUMBILICAL AUGMENTATION MAMMAPLASTY     Social History   Socioeconomic History  . Marital status: Widowed    Spouse name: Konrad Dolores  . Number of children: 1  . Years of education: Not on file  . Highest education level: 12th grade  Occupational History  . Occupation: Emergency planning/management officer    Comment: retired  Tobacco Use  . Smoking status: Former Smoker    Quit date: 05/30/1994    Years since quitting: 25.4  . Smokeless tobacco: Never Used  Substance and Sexual Activity  . Alcohol use: Yes  Comment: rare- 1 glass of wine EOW  . Drug use: No  . Sexual activity: Never  Other Topics Concern  . Not on file  Social History Narrative  . Not on file   Social Determinants of Health   Financial Resource Strain: Low Risk   . Difficulty of Paying Living Expenses: Not hard at all  Food Insecurity: No Food Insecurity  . Worried About Charity fundraiser in the Last Year: Never true  . Ran Out of Food in the Last Year: Never true  Transportation Needs: No Transportation Needs  . Lack of  Transportation (Medical): No  . Lack of Transportation (Non-Medical): No  Physical Activity: Inactive  . Days of Exercise per Week: 0 days  . Minutes of Exercise per Session: 0 min  Stress: No Stress Concern Present  . Feeling of Stress : Not at all  Social Connections: Moderately Isolated  . Frequency of Communication with Friends and Family: More than three times a week  . Frequency of Social Gatherings with Friends and Family: Three times a week  . Attends Religious Services: Never  . Active Member of Clubs or Organizations: No  . Attends Archivist Meetings: Never  . Marital Status: Widowed  Intimate Partner Violence: Not At Risk  . Fear of Current or Ex-Partner: No  . Emotionally Abused: No  . Physically Abused: No  . Sexually Abused: No   Family Status  Relation Name Status  . Mother  Deceased  . Father  Deceased  . Sister  Deceased  . Brother  Deceased  . Brother  Deceased  . Brother  Deceased  . Brother  Deceased  . PGM  (Not Specified)  . PGF  (Not Specified)  . Mat Uncle  (Not Specified)  . Mat Aunt  (Not Specified)   Family History  Problem Relation Age of Onset  . Hyperlipidemia Mother   . Congestive Heart Failure Mother   . COPD Mother   . Heart disease Mother   . Hypertension Mother   . Osteoporosis Mother   . Pancreatic cancer Father   . Arthritis Sister   . Alzheimer's disease Sister   . Hypertension Sister   . Prostate cancer Brother   . Heart attack Brother   . Stomach cancer Brother   . Kidney failure Brother   . Leukemia Brother   . Emphysema Brother   . Pancreatic cancer Brother   . Stomach cancer Brother   . Breast cancer Paternal Grandmother   . Leukemia Paternal Grandfather   . Heart attack Maternal Uncle   . Stroke Maternal Aunt    Allergies  Allergen Reactions  . Amoxicillin Anaphylaxis  . Naproxen Hives, Shortness Of Breath, Nausea And Vomiting and Swelling  . Penicillins Shortness Of Breath, Swelling and Rash    Has  patient had a PCN reaction causing immediate rash, facial/tongue/throat swelling, SOB or lightheadedness with hypotension: Yes Has patient had a PCN reaction causing severe rash involving mucus membranes or skin necrosis: Yes Has patient had a PCN reaction that required hospitalization: Yes Has patient had a PCN reaction occurring within the last 10 years: No  If all of the above answers are "NO", then may proceed with Cephalosporin use.   . Codeine Nausea And Vomiting  . Levofloxacin Nausea And Vomiting  . Shellfish Allergy Hives, Diarrhea, Nausea And Vomiting and Swelling    Patient Care Team: Virginia Crews, MD as PCP - General (Family Medicine) Delana Meyer, Dolores Lory, MD as Consulting  Physician (Vascular Surgery) Corey Skains, MD as Consulting Physician (Cardiology) Lonia Farber, MD as Consulting Physician (Internal Medicine) Poggi, Marshall Cork, MD as Consulting Physician (Surgery) Bertram Gala as Physician Assistant (Gastroenterology) Mar Daring, [name] as Physician Assistant (Family Medicine) Reva Bores, MD as Referring Physician (Hematology and Oncology) Magda Bernheim, MD as Referring Physician (Orthopedic Surgery) Dasher, Rayvon Char, MD (Dermatology)   Medications: Outpatient Medications Prior to Visit  Medication Sig  . acetaminophen (TYLENOL) 500 MG tablet Take 500 mg by mouth every 6 (six) hours as needed.  . Ascorbic Acid (VITAMIN C) 1000 MG tablet Take 500 mg by mouth daily.   . B COMPLEX VITAMINS PO Take 1 tablet by mouth daily. B COMPLEX (Oral Capsule)  1 Every Day for 0 days  Quantity: 0.00;  Refills: 0   Ordered :13-Apr-2010  Celene Kras, MA, Anastasiya ;  Started 29-October-2008 Active Comments: DX: 414.00  . Calcium-Vitamin D-Vitamin K 500-100-40 MG-UNT-MCG CHEW Chew 1 tablet by mouth 2 (two) times daily. VIACTIV, 500-100-40 (Oral Tablet Chewable)  1 tablet twice a day for 0 days  Quantity: 0.00;  Refills: 0   Ordered :13-Apr-2010  Celene Kras,  MA, Anastasiya ;  Started 29-October-2008 Active Comments: DX: 733.00  . Cranberry 300 MG tablet Take 300 mg by mouth daily.   Marland Kitchen dicyclomine (BENTYL) 10 MG capsule   . docusate sodium (COLACE) 100 MG capsule Take 100 mg by mouth 2 (two) times daily.  . famotidine (PEPCID) 20 MG tablet TAKE 1 TABLET BY MOUTH 2 TIMES DAILY  . ferrous sulfate 325 (65 FE) MG EC tablet Take 325 mg by mouth daily. FERROUS SULFATE, 325 (65 Fe)MG (Oral Tablet Delayed Release)  1 every day for 0 days  Quantity: 0.00;  Refills: 0   Ordered :13-Apr-2010  Celene Kras, MA, Anastasiya ;  Started 29-October-2008 Active Comments: DX: 285.9  . fexofenadine (ALLEGRA) 180 MG tablet Take 180 mg by mouth at bedtime.   . hyoscyamine (LEVSIN, ANASPAZ) 0.125 MG tablet Take by mouth.  . Mesalamine (ASACOL HD) 800 MG TBEC TAKE 2 TABLETS BY MOUTH TWICE A DAY.  . methimazole (TAPAZOLE) 5 MG tablet Take 2.5 mg by mouth daily.   . Multiple Vitamins-Minerals (CENTRUM SILVER PO) Take 1 capsule by mouth daily.  . OxyCODONE HCl, Abuse Deter, (OXAYDO) 5 MG TABA Take by mouth.  . [DISCONTINUED] amLODipine (NORVASC) 5 MG tablet TAKE 1 TABLET BY MOUTH DAILY  . [DISCONTINUED] clopidogrel (PLAVIX) 75 MG tablet TAKE 1 TABLET BY MOUTH ONCE A DAY  . [DISCONTINUED] ezetimibe (ZETIA) 10 MG tablet TAKE 1 TABLET BY MOUTH DAILY  . [DISCONTINUED] fluticasone (FLONASE) 50 MCG/ACT nasal spray USE 2 SPRAYS EACH NOSTRIL DAILY  . [DISCONTINUED] losartan (COZAAR) 50 MG tablet Take 1 tablet (50 mg total) by mouth daily.  . [DISCONTINUED] rosuvastatin (CRESTOR) 20 MG tablet Take 0.5 tablets (10 mg total) by mouth daily.  . montelukast (SINGULAIR) 10 MG tablet Take 1 tablet (10 mg total) by mouth at bedtime. (Patient not taking: Reported on 10/08/2019)   No facility-administered medications prior to visit.    Review of Systems  Constitutional: Negative.   HENT: Negative.   Eyes: Negative.   Respiratory: Negative.   Cardiovascular: Negative.   Gastrointestinal:  Negative.   Endocrine: Negative.   Genitourinary: Negative.   Musculoskeletal: Negative.   Skin: Negative.   Allergic/Immunologic: Positive for food allergies.  Neurological: Negative.   Hematological: Negative.   Psychiatric/Behavioral: Negative.     Last CBC Lab Results  Component Value Date  WBC 5.0 05/17/2019   HGB 12.5 05/17/2019   HCT 38.9 05/17/2019   MCV 83.5 05/17/2019   MCH 26.8 05/17/2019   RDW 17.2 (H) 05/17/2019   PLT 428 (H) 10/08/209   Last metabolic panel Lab Results  Component Value Date   GLUCOSE 93 05/17/2019   NA 137 05/17/2019   K 3.4 (L) 05/17/2019   CL 98 05/17/2019   CO2 29 05/17/2019   BUN 16 05/17/2019   CREATININE <0.30 (L) 05/17/2019   GFRNONAA NOT CALCULATED 05/17/2019   GFRAA NOT CALCULATED 05/17/2019   CALCIUM 9.6 05/17/2019   PROT 6.5 05/17/2019   ALBUMIN 3.8 05/17/2019   LABGLOB 2.1 05/09/2019   AGRATIO 2.0 05/09/2019   BILITOT 0.5 05/17/2019   ALKPHOS 57 05/17/2019   AST 16 05/17/2019   ALT 13 05/17/2019   ANIONGAP 10 05/17/2019   Last lipids Lab Results  Component Value Date   CHOL 186 05/09/2019   HDL 54 05/09/2019   LDLCALC 112 (H) 05/09/2019   TRIG 112 05/09/2019   CHOLHDL 3.4 05/09/2019   Last hemoglobin A1c No results found for: HGBA1C Last thyroid functions Lab Results  Component Value Date   TSH <0.010 (L) 05/17/2019   T4TOTAL 7.3 12/22/2016   Last vitamin B12 and Folate Lab Results  Component Value Date   VITAMINB12 greater than 1,500 02/22/2019      Objective    BP (!) 90/57 (BP Location: Left Arm, Patient Position: Sitting, Cuff Size: Normal)   Pulse 78   Temp (!) 96.9 F (36.1 C) (Temporal)  BP Readings from Last 3 Encounters:  10/22/19 (!) 90/57  07/06/19 (!) 157/74  05/17/19 110/87   Wt Readings from Last 3 Encounters:  05/17/19 106 lb (48.1 kg)  05/09/19 106 lb (48.1 kg)  03/23/19 106 lb (48.1 kg)      Physical Exam Vitals reviewed.  Constitutional:      General: She is not in  acute distress.    Appearance: Normal appearance. She is well-developed. She is not diaphoretic.  HENT:     Head: Normocephalic and atraumatic.     Right Ear: External ear normal.     Left Ear: External ear normal.     Nose: Nose normal.     Mouth/Throat:     Mouth: Mucous membranes are moist.     Pharynx: Oropharynx is clear. No oropharyngeal exudate.     Comments: Erythema around lips with no swelling Eyes:     General: No scleral icterus.    Conjunctiva/sclera: Conjunctivae normal.     Pupils: Pupils are equal, round, and reactive to light.  Neck:     Thyroid: No thyromegaly.  Cardiovascular:     Rate and Rhythm: Normal rate and regular rhythm.     Pulses: Normal pulses.     Heart sounds: Murmur present.  Pulmonary:     Effort: Pulmonary effort is normal. No respiratory distress.     Breath sounds: Normal breath sounds. No wheezing or rales.  Abdominal:     General: There is no distension.     Palpations: Abdomen is soft.     Tenderness: There is no abdominal tenderness.  Musculoskeletal:        General: No deformity.     Cervical back: Neck supple.     Right lower leg: No edema.     Comments: S/p L hip disarticulation     Left Lower Extremity: Left leg is amputated above knee.  Lymphadenopathy:     Cervical: No cervical  adenopathy.  Skin:    General: Skin is warm and dry.     Findings: No rash.  Neurological:     Mental Status: She is alert and oriented to person, place, and time. Mental status is at baseline.     Sensory: No sensory deficit.     Motor: No weakness.     Deep Tendon Reflexes: Reflexes normal.  Psychiatric:        Mood and Affect: Mood normal.        Behavior: Behavior normal.        Thought Content: Thought content normal.      Is the patient deaf or have difficulty hearing?: No Does the patient have difficulty seeing, even when wearing glasses/contacts?: No Does the patient have difficulty concentrating, remembering, or making decisions?:  No Does the patient have difficulty walking or climbing stairs?: Yes Does the patient have difficulty dressing or bathing?: No Does the patient have difficulty doing errands alone such as visiting a doctor's office or shopping?: Yes Depression Screen  PHQ 2/9 Scores 10/08/2019 09/21/2018 08/15/2017  PHQ - 2 Score 1 1 0  PHQ- 9 Score - - 1   Cognitive Testing - 6-CIT / Patient refused  Functional Status Survey: Is the patient deaf or have difficulty hearing?: No Does the patient have difficulty seeing, even when wearing glasses/contacts?: No Does the patient have difficulty concentrating, remembering, or making decisions?: No Does the patient have difficulty walking or climbing stairs?: Yes Does the patient have difficulty dressing or bathing?: No Does the patient have difficulty doing errands alone such as visiting a doctor's office or shopping?: Yes  Audit-C Alcohol Use Screening   Alcohol Use Disorder Test (AUDIT) 10/08/2019  1. How often do you have a drink containing alcohol? 2  2. How many drinks containing alcohol do you have on a typical day when you are drinking? 0  3. How often do you have six or more drinks on one occasion? 0  AUDIT-C Score 2  Alcohol Brief Interventions/Follow-up AUDIT Score <7 follow-up not indicated    A score of 3 or more in women, and 4 or more in men indicates increased risk for alcohol abuse, EXCEPT if all of the points are from question 1   No results found for any visits on 10/22/19.  Assessment & Plan    Routine Health Maintenance and Physical Exam  Exercise Activities and Dietary recommendations Goals    . DIET - INCREASE WATER INTAKE     Recommend increasing water intake to 4 glasses of water a day.     Marland Kitchen LIFESTYLE - DECREASE FALLS RISK     Recommend to remove any items from the home that may cause slips or trips.       Immunization History  Administered Date(s) Administered  . Influenza Split 04/19/2011  . Influenza, High Dose  Seasonal PF 02/18/2014, 03/12/2016, 03/07/2019  . Influenza,inj,Quad PF,6+ Mos 02/10/2013, 03/07/2017, 03/24/2018  . Influenza-Unspecified 02/10/2013, 03/01/2015  . Pneumococcal Conjugate-13 05/18/2013  . Pneumococcal Polysaccharide-23 03/02/2004, 08/15/2017  . Td 02/08/2005, 11/04/2017  . Zoster 01/29/2006    Health Maintenance  Topic Date Due  . COVID-19 Vaccine (1) Never done  . INFLUENZA VACCINE  12/30/2019  . TETANUS/TDAP  11/05/2027  . DEXA SCAN  Completed  . PNA vac Low Risk Adult  Completed    Discussed health benefits of physical activity, and encouraged her to engage in regular exercise appropriate for her age and condition.  Problem List Items Addressed This Visit  Cardiovascular and Mediastinum   Essential (primary) hypertension    Low today Stop Losartan for 2 weeks, due to possible cause of rash Patient to check blood pressure at home bp should be less than 130/90 Check CMP Follow up in 6 months      Relevant Medications   rosuvastatin (CRESTOR) 20 MG tablet   losartan (COZAAR) 50 MG tablet   ezetimibe (ZETIA) 10 MG tablet   amLODipine (NORVASC) 5 MG tablet   Other Relevant Orders   Comprehensive metabolic panel   CBC with Differential/Platelet   Peripheral vascular disease ([place])    Followed yb cardiology and VVS S/p b/l iliac and femoral stenting and L renal artery stent Continue plavix Discussed importance of HLD and HTN control      Relevant Medications   rosuvastatin (CRESTOR) 20 MG tablet   losartan (COZAAR) 50 MG tablet   ezetimibe (ZETIA) 10 MG tablet   amLODipine (NORVASC) 5 MG tablet     Digestive   Ulcerative colitis ([place])    Followed by GI Continue mesalamine No recent flares        Endocrine   Hyperthyroidism    Followed by endocrinology Check TSH  Continue current dose of medication       Relevant Orders   TSH     Other   Hypercholesteremia    Patient doing well on medications Continue Crestor and Zetia  daily Recheck lipid and CMP Follow up in 6 months      Relevant Medications   rosuvastatin (CRESTOR) 20 MG tablet   losartan (COZAAR) 50 MG tablet   ezetimibe (ZETIA) 10 MG tablet   amLODipine (NORVASC) 5 MG tablet   Other Relevant Orders   Lipid Panel With LDL/HDL Ratio   Status post above-knee amputation of left lower extremity ([place])    Recovering well from L hip disarticulation Followed by PM&R In new wheelchair       Other Visit Diagnoses    Annual physical exam    -  Primary   Relevant Orders   Lipid Panel With LDL/HDL Ratio   Comprehensive metabolic panel   TSH   CBC with Differential/Platelet       Return in about 6 months (around 04/23/2020) for chronic disease f/u.     I, Lavon Paganini, MD, have reviewed all documentation for this visit. The documentation on 10/22/19 for the exam, diagnosis, procedures, and orders are all accurate and complete.   Sabri Teal, Dionne Bucy, MD, MPH [facility]

## 2019-10-22 NOTE — Patient Instructions (Addendum)
Stop Losartan 14m for 2 weeks. Check blood pressure at home, blood pressure should be below 130/90. Please call or message with blood pressure readings.    Hypertension, Adult High blood pressure (hypertension) is when the force of blood pumping through the arteries is too strong. The arteries are the blood vessels that carry blood from the heart throughout the body. Hypertension forces the heart to work harder to pump blood and may cause arteries to become narrow or stiff. Untreated or uncontrolled hypertension can cause a heart attack, heart failure, a stroke, kidney disease, and other problems. A blood pressure reading consists of a higher number over a lower number. Ideally, your blood pressure should be below 120/80. The first ("top") number is called the systolic pressure. It is a measure of the pressure in your arteries as your heart beats. The second ("bottom") number is called the diastolic pressure. It is a measure of the pressure in your arteries as the heart relaxes. What are the causes? The exact cause of this condition is not known. There are some conditions that result in or are related to high blood pressure. What increases the risk? Some risk factors for high blood pressure are under your control. The following factors may make you more likely to develop this condition:  Smoking.  Having type 2 diabetes mellitus, high cholesterol, or both.  Not getting enough exercise or physical activity.  Being overweight.  Having too much fat, sugar, calories, or salt (sodium) in your diet.  Drinking too much alcohol. Some risk factors for high blood pressure may be difficult or impossible to change. Some of these factors include:  Having chronic kidney disease.  Having a family history of high blood pressure.  Age. Risk increases with age.  Race. You may be at higher risk if you are African American.  Gender. Men are at higher risk than women before age 157 After age 79 women  are at higher risk than men.  Having obstructive sleep apnea.  Stress. What are the signs or symptoms? High blood pressure may not cause symptoms. Very high blood pressure (hypertensive crisis) may cause:  Headache.  Anxiety.  Shortness of breath.  Nosebleed.  Nausea and vomiting.  Vision changes.  Severe chest pain.  Seizures. How is this diagnosed? This condition is diagnosed by measuring your blood pressure while you are seated, with your arm resting on a flat surface, your legs uncrossed, and your feet flat on the floor. The cuff of the blood pressure monitor will be placed directly against the skin of your upper arm at the level of your heart. It should be measured at least twice using the same arm. Certain conditions can cause a difference in blood pressure between your right and left arms. Certain factors can cause blood pressure readings to be lower or higher than normal for a short period of time:  When your blood pressure is higher when you are in a health care provider's office than when you are at home, this is called white coat hypertension. Most people with this condition do not need medicines.  When your blood pressure is higher at home than when you are in a health care provider's office, this is called masked hypertension. Most people with this condition may need medicines to control blood pressure. If you have a high blood pressure reading during one visit or you have normal blood pressure with other risk factors, you may be asked to:  Return on a different day to have your  blood pressure checked again.  Monitor your blood pressure at home for 1 week or longer. If you are diagnosed with hypertension, you may have other blood or imaging tests to help your health care provider understand your overall risk for other conditions. How is this treated? This condition is treated by making healthy lifestyle changes, such as eating healthy foods, exercising more, and  reducing your alcohol intake. Your health care provider may prescribe medicine if lifestyle changes are not enough to get your blood pressure under control, and if:  Your systolic blood pressure is above 130.  Your diastolic blood pressure is above 80. Your personal target blood pressure may vary depending on your medical conditions, your age, and other factors. Follow these instructions at home: Eating and drinking   Eat a diet that is high in fiber and potassium, and low in sodium, added sugar, and fat. An example eating plan is called the DASH (Dietary Approaches to Stop Hypertension) diet. To eat this way: ? Eat plenty of fresh fruits and vegetables. Try to fill one half of your plate at each meal with fruits and vegetables. ? Eat whole grains, such as whole-wheat pasta, brown rice, or whole-grain bread. Fill about one fourth of your plate with whole grains. ? Eat or drink low-fat dairy products, such as skim milk or low-fat yogurt. ? Avoid fatty cuts of meat, processed or cured meats, and poultry with skin. Fill about one fourth of your plate with lean proteins, such as fish, chicken without skin, beans, eggs, or tofu. ? Avoid pre-made and processed foods. These tend to be higher in sodium, added sugar, and fat.  Reduce your daily sodium intake. Most people with hypertension should eat less than 1,500 mg of sodium a day.  Do not drink alcohol if: ? Your health care provider tells you not to drink. ? You are pregnant, may be pregnant, or are planning to become pregnant.  If you drink alcohol: ? Limit how much you use to:  0-1 drink a day for women.  0-2 drinks a day for men. ? Be aware of how much alcohol is in your drink. In the [place] one drink equals one 12 oz bottle of beer (355 mL), one 5 oz glass of wine (148 mL), or one 1 oz glass of hard liquor (44 mL). Lifestyle   Work with your health care provider to maintain a healthy body weight or to lose weight. Ask what an ideal  weight is for you.  Get at least 30 minutes of exercise most days of the week. Activities may include walking, swimming, or biking.  Include exercise to strengthen your muscles (resistance exercise), such as Pilates or lifting weights, as part of your weekly exercise routine. Try to do these types of exercises for 30 minutes at least 3 days a week.  Do not use any products that contain nicotine or tobacco, such as cigarettes, e-cigarettes, and chewing tobacco. If you need help quitting, ask your health care provider.  Monitor your blood pressure at home as told by your health care provider.  Keep all follow-up visits as told by your health care provider. This is important. Medicines  Take over-the-counter and prescription medicines only as told by your health care provider. Follow directions carefully. Blood pressure medicines must be taken as prescribed.  Do not skip doses of blood pressure medicine. Doing this puts you at risk for problems and can make the medicine less effective.  Ask your health care provider about side  effects or reactions to medicines that you should watch for. Contact a health care provider if you:  Think you are having a reaction to a medicine you are taking.  Have headaches that keep coming back (recurring).  Feel dizzy.  Have swelling in your ankles.  Have trouble with your vision. Get help right away if you:  Develop a severe headache or confusion.  Have unusual weakness or numbness.  Feel faint.  Have severe pain in your chest or abdomen.  Vomit repeatedly.  Have trouble breathing. Summary  Hypertension is when the force of blood pumping through your arteries is too strong. If this condition is not controlled, it may put you at risk for serious complications.  Your personal target blood pressure may vary depending on your medical conditions, your age, and other factors. For most people, a normal blood pressure is less than  120/80.  Hypertension is treated with lifestyle changes, medicines, or a combination of both. Lifestyle changes include losing weight, eating a healthy, low-sodium diet, exercising more, and limiting alcohol. This information is not intended to replace advice given to you by your health care provider. Make sure you discuss any questions you have with your health care provider. Document Revised: 01/25/2018 Document Reviewed: 01/25/2018 Elsevier Patient Education  2020 [employer].

## 2019-10-22 NOTE — Assessment & Plan Note (Signed)
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

## 2019-10-23 ENCOUNTER — Telehealth: Payer: Self-pay

## 2019-10-23 LAB — COMPREHENSIVE METABOLIC PANEL
ALT: 12 IU/L (ref 0–32)
AST: 15 IU/L (ref 0–40)
Albumin/Globulin Ratio: 2 (ref 1.2–2.2)
Albumin: 4.4 g/dL (ref 3.7–4.7)
Alkaline Phosphatase: 79 IU/L (ref 48–121)
BUN/Creatinine Ratio: 47 — ABNORMAL HIGH (ref 12–28)
BUN: 25 mg/dL (ref 8–27)
Bilirubin Total: 0.3 mg/dL (ref 0.0–1.2)
CO2: 22 mmol/L (ref 20–29)
Calcium: 9.6 mg/dL (ref 8.7–10.3)
Chloride: 100 mmol/L (ref 96–106)
Creatinine, Ser: 0.53 mg/dL — ABNORMAL LOW (ref 0.57–1.00)
GFR calc Af Amer: 105 mL/min/{1.73_m2} (ref >59)
GFR calc non Af Amer: 91 mL/min/{1.73_m2} (ref >59)
Globulin, Total: 2.2 g/dL (ref 1.5–4.5)
Glucose: 96 mg/dL (ref 65–99)
Potassium: 4 mmol/L (ref 3.5–5.2)
Sodium: 137 mmol/L (ref 134–144)
Total Protein: 6.6 g/dL (ref 6.0–8.5)

## 2019-10-23 LAB — CBC WITH DIFFERENTIAL/PLATELET
Basophils Absolute: 0 10*3/uL (ref 0.0–0.2)
Basos: 0 %
EOS (ABSOLUTE): 0 10*3/uL (ref 0.0–0.4)
Eos: 0 %
Hematocrit: 43.3 % (ref 34.0–46.6)
Hemoglobin: 14.6 g/dL (ref 11.1–15.9)
Immature Grans (Abs): 0 10*3/uL (ref 0.0–0.1)
Immature Granulocytes: 0 %
Lymphocytes Absolute: 0.8 10*3/uL (ref 0.7–3.1)
Lymphs: 11 %
MCH: 32.1 pg (ref 26.6–33.0)
MCHC: 33.7 g/dL (ref 31.5–35.7)
MCV: 95 fL (ref 79–97)
Monocytes Absolute: 0.8 10*3/uL (ref 0.1–0.9)
Monocytes: 10 %
Neutrophils Absolute: 5.9 10*3/uL (ref 1.4–7.0)
Neutrophils: 79 %
Platelets: 377 10*3/uL (ref 150–450)
RBC: 4.55 x10E6/uL (ref 3.77–5.28)
RDW: 11.9 % (ref 11.7–15.4)
WBC: 7.5 10*3/uL (ref 3.4–10.8)

## 2019-10-23 LAB — LIPID PANEL WITH LDL/HDL RATIO
Cholesterol, Total: 206 mg/dL — ABNORMAL HIGH (ref 100–199)
HDL: 77 mg/dL (ref >39)
LDL Chol Calc (NIH): 108 mg/dL — ABNORMAL HIGH (ref 0–99)
LDL/HDL Ratio: 1.4 ratio (ref 0.0–3.2)
Triglycerides: 121 mg/dL (ref 0–149)
VLDL Cholesterol Cal: 21 mg/dL (ref 5–40)

## 2019-10-23 LAB — TSH: TSH: 0.269 u[IU]/mL — ABNORMAL LOW (ref 0.450–4.500)

## 2019-10-23 NOTE — Telephone Encounter (Signed)
Result [employer]   Result Notes and Comments to Patient Comment seen by patient Misty Page on 10/23/2019 8:21 AM EDT

## 2019-10-23 NOTE — Telephone Encounter (Signed)
-----   Message from Virginia Crews, MD sent at 10/23/2019  8:16 AM EDT ----- Normal labs, except TSH is still low (hyperthyroidism).  Let Endocrinology know. Cholesterol not quite to goal, so continue Zetia and Crestor

## 2019-11-05 DIAGNOSIS — Z7409 Other reduced mobility: Secondary | ICD-10-CM | POA: Diagnosis not present

## 2019-11-05 DIAGNOSIS — Z89622 Acquired absence of left hip joint: Secondary | ICD-10-CM | POA: Diagnosis not present

## 2019-11-15 ENCOUNTER — Telehealth: Payer: Self-pay

## 2019-11-15 DIAGNOSIS — K130 Diseases of lips: Secondary | ICD-10-CM

## 2019-11-15 NOTE — Telephone Encounter (Signed)
Copied from [place] [id number]. Topic: General - Other >> Nov 15, 2019  2:41 PM Percell Belt A wrote: Pt Calld in and stated she would like to talk to Dr B nurse about her BP meds.  She stated Dr B took her off of them due to her lips swelling and peeling.  She has some question in reference to that   Best number [phone]

## 2019-11-16 NOTE — Telephone Encounter (Signed)
Can order labs for her. She can have drawn at her convenience. Will leave message for Dr. B to see BP readings.

## 2019-11-16 NOTE — Telephone Encounter (Signed)
Patient called to reports BP readings after being off Losartan. 10/26/2019 - 11/15/2019 11/73, 99/63, 102/75, 122/79, 124/78, 94/76, 141/86, 119/69, 137/73, 108/73, 109/70, 120/70, 111/79, 117/76, 112/75, 94/76  Patient reports that her lips are still irritated. Patient reports that steroid cream did help, but does not want to continue using for a long time. Patient reports that she has been using Vaseline to help. Patient reports that she does not want to go back to Dr. Evorn Gong, reports he was very rude to her. Patient requesting for labs to check for deficiency in her blood.

## 2019-11-16 NOTE — Addendum Note (Signed)
Addended by: Mar Daring on: 11/16/2019 10:39 AM   Modules accepted: Orders

## 2019-11-19 NOTE — Telephone Encounter (Signed)
BP looks ok overall.  Is she having any dizziness with those lower ones?  She certainly does not need additional meds for her BP.

## 2019-11-20 NOTE — Telephone Encounter (Signed)
Patient advised as below. Patient did want to know if you have any other treatment options for her lips. Patient reports she has been using steroid cream that Dr. Evorn Gong prescribed. Reports cream does help, but irritation and peeling continue when she stops using cream. Patient would like to a more definite solution. Please advise.

## 2019-11-21 DIAGNOSIS — K130 Diseases of lips: Secondary | ICD-10-CM | POA: Diagnosis not present

## 2019-11-21 NOTE — Addendum Note (Signed)
Addended by: Shawna Orleans on: 11/21/2019 04:30 PM   Modules accepted: Orders

## 2019-11-21 NOTE — Telephone Encounter (Signed)
I don't have another treatment and would recommend f/u with Derm

## 2019-11-26 ENCOUNTER — Other Ambulatory Visit: Payer: Self-pay

## 2019-11-26 ENCOUNTER — Ambulatory Visit (INDEPENDENT_AMBULATORY_CARE_PROVIDER_SITE_OTHER): Payer: PPO

## 2019-11-26 ENCOUNTER — Other Ambulatory Visit (INDEPENDENT_AMBULATORY_CARE_PROVIDER_SITE_OTHER): Payer: Self-pay | Admitting: Nurse Practitioner

## 2019-11-26 ENCOUNTER — Encounter (INDEPENDENT_AMBULATORY_CARE_PROVIDER_SITE_OTHER): Payer: Self-pay | Admitting: Nurse Practitioner

## 2019-11-26 ENCOUNTER — Ambulatory Visit (INDEPENDENT_AMBULATORY_CARE_PROVIDER_SITE_OTHER): Payer: PPO | Admitting: Nurse Practitioner

## 2019-11-26 VITALS — BP 155/77 | HR 81 | Ht 60.0 in | Wt 110.0 lb

## 2019-11-26 DIAGNOSIS — I10 Essential (primary) hypertension: Secondary | ICD-10-CM | POA: Diagnosis not present

## 2019-11-26 DIAGNOSIS — G458 Other transient cerebral ischemic attacks and related syndromes: Secondary | ICD-10-CM | POA: Diagnosis not present

## 2019-11-26 DIAGNOSIS — I6523 Occlusion and stenosis of bilateral carotid arteries: Secondary | ICD-10-CM | POA: Diagnosis not present

## 2019-11-26 DIAGNOSIS — E7800 Pure hypercholesterolemia, unspecified: Secondary | ICD-10-CM | POA: Diagnosis not present

## 2019-11-26 LAB — IRON,TIBC AND FERRITIN PANEL
Ferritin: 73 ng/mL (ref 15–150)
Iron Saturation: 11 % — ABNORMAL LOW (ref 15–55)
Iron: 39 ug/dL (ref 27–139)
Total Iron Binding Capacity: 351 ug/dL (ref 250–450)
UIBC: 312 ug/dL (ref 118–369)

## 2019-11-26 LAB — VITAMIN B2, WHOLE BLOOD: Vitamin B2, Whole Blood: 232 ug/L (ref 137–370)

## 2019-11-26 LAB — B12 AND FOLATE PANEL
Folate: 18.1 ng/mL (ref >3.0)
Vitamin B-12: 487 pg/mL (ref 232–1245)

## 2019-11-26 LAB — ZINC: Zinc: 67 ug/dL (ref 44–115)

## 2019-11-27 ENCOUNTER — Telehealth: Payer: Self-pay

## 2019-11-27 ENCOUNTER — Encounter (INDEPENDENT_AMBULATORY_CARE_PROVIDER_SITE_OTHER): Payer: Self-pay | Admitting: Nurse Practitioner

## 2019-11-27 NOTE — Telephone Encounter (Signed)
Patient advised as directed below. 

## 2019-11-27 NOTE — Telephone Encounter (Signed)
-----   Message from Mar Daring, [place] sent at 11/27/2019 10:48 AM EDT ----- B12 is normal. Iron levels are holding pretty steady. Make sure to continue iron supplementation. B2 is normal. Zinc is normal.

## 2019-11-27 NOTE — Progress Notes (Signed)
Subjective:    Patient ID: Misty Page, female    DOB: 1940/08/04, 79 y.o.   MRN: [id number] Chief Complaint  Patient presents with  . Follow-up    U/S follow up    The patient returns today for noninvasive studies related to her carotid arteries in addition to her other vascular interventions.  The patient has not had follow-up in 2 years due to dealing with a sarcoma and treatment of this.  During last year she had a significantly difficult time with recovering following a left hip disarticulation.  Since that time she denies any upper extremity pain or swelling.  She denies any dizziness or lightheadedness.  She denies any TIA or strokelike symptoms.  She denies any chest pain or shortness of breath.  Other than treatment for her malignancy, the patient is doing fairly well.  However, the patient does report having falls due to the disarticulation.  Today noninvasive studies show the patient's history of bilateral carotid endarterectomies are patent.  There is no evidence of stenosis in the right or left internal carotid arteries.  There is also no evidence of thrombus, dissection or plaque in the cervical carotid system in the right.  Previously placed innominate artery stent is patent.  The left subclavian artery shows evidence of a dampened biphasic waveform with retrograde, bidirectional flow in the vertebral artery.  Previous test done on 09/26/2017, show antegrade flow in the left vertebral artery with strong biphasic waveforms in the left subclavian artery.  There is also a 40 mmHg difference in arm pressures.  This is indicative of left subclavian steal syndrome.   Review of Systems  Musculoskeletal: Negative for neck pain.  Neurological: Positive for weakness. Negative for dizziness and light-headedness.  All other systems reviewed and are negative.      Objective:   Physical Exam Vitals reviewed.  HENT:     Head: Normocephalic.  Neck:     Vascular: Carotid bruit present.    Cardiovascular:     Rate and Rhythm: Normal rate and regular rhythm.     Heart sounds: Murmur heard.   Pulmonary:     Effort: Pulmonary effort is normal.     Breath sounds: Normal breath sounds.  Feet:     Comments: Left hip disarticulation Neurological:     Mental Status: She is alert and oriented to person, place, and time.     Motor: Weakness present.     Gait: Gait abnormal.  Psychiatric:        Mood and Affect: Mood normal.        Behavior: Behavior normal.        Thought Content: Thought content normal.        Judgment: Judgment normal.     BP (!) 155/77   Pulse 81   Ht 5' (1.524 m)   Wt 110 lb (49.9 kg)   BMI 21.48 kg/m   Past Medical History:  Diagnosis Date  . Allergy   . Anemia   . Arthritis   . CA of skin 10/15/2014  . Calculus of kidney 10/15/2014   Seen in ER 06/16/10.   . Colitis   . Complication of anesthesia    nausea  . Complication of internal prosthetic device 01/27/2009  . Diverticulitis   . Environmental and seasonal allergies   . Fibrocystic breast disease (FCBD)   . GERD (gastroesophageal reflux disease)   . High cholesterol   . History of heart artery stent   . Hyperlipidemia   .  Hypertension   . Myocardial infarction ([place])   . Osteomyelitis of left femur ([place]) 11/30/2018  . Osteoporosis   . Thyroid disease   . TIA (transient ischemic attack)     Social History   Socioeconomic History  . Marital status: Widowed    Spouse name: Konrad Dolores  . Number of children: 1  . Years of education: Not on file  . Highest education level: 12th grade  Occupational History  . Occupation: Emergency planning/management officer    Comment: retired  Tobacco Use  . Smoking status: Former Smoker    Quit date: 05/30/1994    Years since quitting: 25.5  . Smokeless tobacco: Never Used  Vaping Use  . Vaping Use: Never used  Substance and Sexual Activity  . Alcohol use: Yes    Comment: rare- 1 glass of wine EOW  . Drug use: No  . Sexual activity: Never  Other Topics Concern   . Not on file  Social History Narrative  . Not on file   Social Determinants of Health   Financial Resource Strain: Low Risk   . Difficulty of Paying Living Expenses: Not hard at all  Food Insecurity: No Food Insecurity  . Worried About Charity fundraiser in the Last Year: Never true  . Ran Out of Food in the Last Year: Never true  Transportation Needs: No Transportation Needs  . Lack of Transportation (Medical): No  . Lack of Transportation (Non-Medical): No  Physical Activity: Inactive  . Days of Exercise per Week: 0 days  . Minutes of Exercise per Session: 0 min  Stress: No Stress Concern Present  . Feeling of Stress : Not at all  Social Connections: Socially Isolated  . Frequency of Communication with Friends and Family: More than three times a week  . Frequency of Social Gatherings with Friends and Family: Three times a week  . Attends Religious Services: Never  . Active Member of Clubs or Organizations: No  . Attends Archivist Meetings: Never  . Marital Status: Widowed  Intimate Partner Violence: Not At Risk  . Fear of Current or Ex-Partner: No  . Emotionally Abused: No  . Physically Abused: No  . Sexually Abused: No    Past Surgical History:  Procedure Laterality Date  . ABDOMINAL HYSTERECTOMY  2007  . BREAST ENHANCEMENT SURGERY  2004  . BREAST IMPLANT REMOVAL    . BREAST SURGERY  1984   Fibrocystic Disease  . CAROTID ENDARTERECTOMY Left 06/21/2014   Dr. Delana Meyer  . CAROTID ENDARTERECTOMY Right 08/02/2014   Dr. Delana Meyer  . CAROTID PTA/STENT INTERVENTION N/A 06/15/2017   Procedure: CAROTID PTA/STENT INTERVENTION;  Surgeon: Katha Cabal, MD;  Location: [facility];  Service: Cardiovascular;  Laterality: N/A;  . COLONOSCOPY WITH PROPOFOL N/A 07/14/2015   Procedure: COLONOSCOPY WITH PROPOFOL;  Surgeon: Hulen Luster, MD;  Location: [facility] END[facility]COPY;  Service: Gastroenterology;  Laterality: N/A;  . COLONOSCOPY WITH PROPOFOL N/A 02/08/2018    Procedure: COLONOSCOPY WITH PROPOFOL;  Surgeon: Toledo, Benay Pike, MD;  Location: [facility] ENDOSCOPY;  Service: Gastroenterology;  Laterality: N/A;  . CORONARY ANGIOPLASTY WITH STENT PLACEMENT  1999  . ESOPHAGOGASTRODUODENOSCOPY (EGD) WITH PROPOFOL N/A 02/08/2018   Procedure: ESOPHAGOGASTRODUODENOSCOPY (EGD) WITH PROPOFOL;  Surgeon: Toledo, Benay Pike, MD;  Location: [facility] ENDOSCOPY;  Service: Gastroenterology;  Laterality: N/A;  . FEMORAL ARTERY STENT  08/2003  . HERNIA REPAIR  2008  . LEG AMPUTATION AT HIP Left 02/28/2019  . RENAL ARTERY STENT  08/2003  . TRANSUMBILICAL AUGMENTATION  MAMMAPLASTY      Family History  Problem Relation Age of Onset  . Hyperlipidemia Mother   . Congestive Heart Failure Mother   . COPD Mother   . Heart disease Mother   . Hypertension Mother   . Osteoporosis Mother   . Pancreatic cancer Father   . Arthritis Sister   . Alzheimer's disease Sister   . Hypertension Sister   . Prostate cancer Brother   . Heart attack Brother   . Stomach cancer Brother   . Kidney failure Brother   . Leukemia Brother   . Emphysema Brother   . Pancreatic cancer Brother   . Stomach cancer Brother   . Breast cancer Paternal Grandmother   . Leukemia Paternal Grandfather   . Heart attack Maternal Uncle   . Stroke Maternal Aunt     Allergies  Allergen Reactions  . Amoxicillin Anaphylaxis  . Naproxen Hives, Shortness Of Breath, Nausea And Vomiting and Swelling  . Penicillins Shortness Of Breath, Swelling and Rash    Has patient had a PCN reaction causing immediate rash, facial/tongue/throat swelling, SOB or lightheadedness with hypotension: Yes Has patient had a PCN reaction causing severe rash involving mucus membranes or skin necrosis: Yes Has patient had a PCN reaction that required hospitalization: Yes Has patient had a PCN reaction occurring within the last 10 years: No  If all of the above answers are "NO", then may proceed with Cephalosporin use.   . Codeine Nausea And  Vomiting  . Levofloxacin Nausea And Vomiting  . Shellfish Allergy Hives, Diarrhea, Nausea And Vomiting and Swelling       Assessment & Plan:   1. Bilateral carotid artery stenosis Patient had a history of bilateral carotid endarterectomies.  Both internal carotid arteries are patent with no evidence of restenosis.  Patient has had a previous history of a right innominate and and that too is also patent today.  We will continue with close follow-up, especially as it relates to the subclavian steal in the left upper extremity.  We will repeat noninvasive studies in 6 months or sooner if the patient should suddenly become symptomatic.  2. Essential (primary) hypertension Acceptable BP today. Continue antihypertensive medications as already ordered, these medications have been reviewed and there are no changes at this time.   3. Hypercholesteremia Continue statin as ordered and reviewed, no changes at this time  4. Subclavian steal syndrome Per noninvasive studies today the patient had has evidence of left subclavian steal.  The patient denies any significant difficulties with her arm or any significant difficulty at this time.  Based on the fact that she is currently asymptomatic, will continue with conservative monitoring.  We will have the patient return to the office in 6 months for noninvasive studies.  Current Outpatient Medications on File Prior to Visit  Medication Sig Dispense Refill  . Ascorbic Acid (VITAMIN C) 1000 MG tablet Take 500 mg by mouth daily.     . Calcium-Vitamin D-Vitamin K 500-100-40 MG-UNT-MCG CHEW Chew 1 tablet by mouth 2 (two) times daily. VIACTIV, 500-100-40 (Oral Tablet Chewable)  1 tablet twice a day for 0 days  Quantity: 0.00;  Refills: 0   Ordered :13-Apr-2010  Celene Kras, MA, Anastasiya ;  Started 29-October-2008 Active Comments: DX: 733.00    . clopidogrel (PLAVIX) 75 MG tablet Take 1 tablet (75 mg total) by mouth daily. 90 tablet 3  . Cranberry 300 MG tablet  Take 300 mg by mouth daily.     Marland Kitchen ezetimibe (ZETIA) 10  MG tablet Take 1 tablet (10 mg total) by mouth daily. 90 tablet 3  . famotidine (PEPCID) 20 MG tablet TAKE 1 TABLET BY MOUTH 2 TIMES DAILY 180 tablet 0  . ferrous sulfate 325 (65 FE) MG EC tablet Take 325 mg by mouth daily. FERROUS SULFATE, 325 (65 Fe)MG (Oral Tablet Delayed Release)  1 every day for 0 days  Quantity: 0.00;  Refills: 0   Ordered :13-Apr-2010  Celene Kras, MA, Anastasiya ;  Started 29-October-2008 Active Comments: DX: 285.9    . fexofenadine (ALLEGRA) 180 MG tablet Take 180 mg by mouth at bedtime.     . fluticasone (FLONASE) 50 MCG/ACT nasal spray USE 2 SPRAYS EACH NOSTRIL DAILY 48 g 3  . losartan (COZAAR) 50 MG tablet Take 1 tablet (50 mg total) by mouth daily. 90 tablet 3  . Mesalamine (ASACOL HD) 800 MG TBEC TAKE 2 TABLETS BY MOUTH TWICE A DAY.    . methimazole (TAPAZOLE) 5 MG tablet Take 2.5 mg by mouth daily.     . montelukast (SINGULAIR) 10 MG tablet Take 1 tablet (10 mg total) by mouth at bedtime. 90 tablet 1  . rosuvastatin (CRESTOR) 20 MG tablet Take 0.5 tablets (10 mg total) by mouth daily. 45 tablet 3  . acetaminophen (TYLENOL) 500 MG tablet Take 500 mg by mouth every 6 (six) hours as needed. (Patient not taking: Reported on 11/26/2019)    . amLODipine (NORVASC) 5 MG tablet Take 1 tablet (5 mg total) by mouth daily. (Patient not taking: Reported on 11/26/2019) 90 tablet 3  . B COMPLEX VITAMINS PO Take 1 tablet by mouth daily. B COMPLEX (Oral Capsule)  1 Every Day for 0 days  Quantity: 0.00;  Refills: 0   Ordered :13-Apr-2010  Celene Kras, MA, Anastasiya ;  Started 29-October-2008 Active Comments: DX: 414.00 (Patient not taking: Reported on 11/26/2019)    . dicyclomine (BENTYL) 10 MG capsule     . docusate sodium (COLACE) 100 MG capsule Take 100 mg by mouth 2 (two) times daily. (Patient not taking: Reported on 11/26/2019)    . hyoscyamine (LEVSIN, ANASPAZ) 0.125 MG tablet Take by mouth.    . Multiple Vitamins-Minerals (CENTRUM  SILVER PO) Take 1 capsule by mouth daily. (Patient not taking: Reported on 11/26/2019)    . OxyCODONE HCl, Abuse Deter, (OXAYDO) 5 MG TABA Take by mouth. (Patient not taking: Reported on 11/26/2019)     No current facility-administered medications on file prior to visit.    There are no Patient Instructions on file for this visit. No follow-ups on file.   Kris Hartmann, NP

## 2019-12-05 DIAGNOSIS — Z7409 Other reduced mobility: Secondary | ICD-10-CM | POA: Diagnosis not present

## 2019-12-05 DIAGNOSIS — Z89622 Acquired absence of left hip joint: Secondary | ICD-10-CM | POA: Diagnosis not present

## 2019-12-12 DIAGNOSIS — E0590 Thyrotoxicosis, unspecified without thyrotoxic crisis or storm: Secondary | ICD-10-CM | POA: Diagnosis not present

## 2019-12-12 DIAGNOSIS — E042 Nontoxic multinodular goiter: Secondary | ICD-10-CM | POA: Diagnosis not present

## 2020-01-01 ENCOUNTER — Ambulatory Visit: Payer: PPO | Admitting: Dermatology

## 2020-01-05 DIAGNOSIS — Z7409 Other reduced mobility: Secondary | ICD-10-CM | POA: Diagnosis not present

## 2020-01-05 DIAGNOSIS — Z89622 Acquired absence of left hip joint: Secondary | ICD-10-CM | POA: Diagnosis not present

## 2020-01-07 ENCOUNTER — Other Ambulatory Visit: Payer: Self-pay | Admitting: Physician Assistant

## 2020-01-18 DIAGNOSIS — M7581 Other shoulder lesions, right shoulder: Secondary | ICD-10-CM | POA: Diagnosis not present

## 2020-01-18 DIAGNOSIS — M75111 Incomplete rotator cuff tear or rupture of right shoulder, not specified as traumatic: Secondary | ICD-10-CM | POA: Diagnosis not present

## 2020-01-22 DIAGNOSIS — Z89612 Acquired absence of left leg above knee: Secondary | ICD-10-CM | POA: Diagnosis not present

## 2020-01-22 DIAGNOSIS — R918 Other nonspecific abnormal finding of lung field: Secondary | ICD-10-CM | POA: Diagnosis not present

## 2020-01-22 DIAGNOSIS — Z87891 Personal history of nicotine dependence: Secondary | ICD-10-CM | POA: Diagnosis not present

## 2020-01-22 DIAGNOSIS — Z888 Allergy status to other drugs, medicaments and biological substances status: Secondary | ICD-10-CM | POA: Diagnosis not present

## 2020-01-22 DIAGNOSIS — Z880 Allergy status to penicillin: Secondary | ICD-10-CM | POA: Diagnosis not present

## 2020-01-22 DIAGNOSIS — Z886 Allergy status to analgesic agent status: Secondary | ICD-10-CM | POA: Diagnosis not present

## 2020-01-22 DIAGNOSIS — C4922 Malignant neoplasm of connective and soft tissue of left lower limb, including hip: Secondary | ICD-10-CM | POA: Diagnosis not present

## 2020-01-22 DIAGNOSIS — Z885 Allergy status to narcotic agent status: Secondary | ICD-10-CM | POA: Diagnosis not present

## 2020-01-22 DIAGNOSIS — I2510 Atherosclerotic heart disease of native coronary artery without angina pectoris: Secondary | ICD-10-CM | POA: Diagnosis not present

## 2020-01-22 DIAGNOSIS — Z881 Allergy status to other antibiotic agents status: Secondary | ICD-10-CM | POA: Diagnosis not present

## 2020-02-05 DIAGNOSIS — Z89622 Acquired absence of left hip joint: Secondary | ICD-10-CM | POA: Diagnosis not present

## 2020-02-05 DIAGNOSIS — Z7409 Other reduced mobility: Secondary | ICD-10-CM | POA: Diagnosis not present

## 2020-02-12 ENCOUNTER — Other Ambulatory Visit: Payer: Self-pay

## 2020-02-12 ENCOUNTER — Encounter: Payer: Self-pay | Admitting: Family Medicine

## 2020-02-12 ENCOUNTER — Emergency Department: Payer: PPO

## 2020-02-12 ENCOUNTER — Telehealth: Payer: Self-pay | Admitting: Family Medicine

## 2020-02-12 ENCOUNTER — Telehealth (INDEPENDENT_AMBULATORY_CARE_PROVIDER_SITE_OTHER): Payer: Self-pay

## 2020-02-12 DIAGNOSIS — Z7902 Long term (current) use of antithrombotics/antiplatelets: Secondary | ICD-10-CM

## 2020-02-12 DIAGNOSIS — K55059 Acute (reversible) ischemia of intestine, part and extent unspecified: Secondary | ICD-10-CM | POA: Diagnosis not present

## 2020-02-12 DIAGNOSIS — Z8673 Personal history of transient ischemic attack (TIA), and cerebral infarction without residual deficits: Secondary | ICD-10-CM

## 2020-02-12 DIAGNOSIS — E039 Hypothyroidism, unspecified: Secondary | ICD-10-CM | POA: Diagnosis present

## 2020-02-12 DIAGNOSIS — Z885 Allergy status to narcotic agent status: Secondary | ICD-10-CM

## 2020-02-12 DIAGNOSIS — K219 Gastro-esophageal reflux disease without esophagitis: Secondary | ICD-10-CM | POA: Diagnosis present

## 2020-02-12 DIAGNOSIS — Z888 Allergy status to other drugs, medicaments and biological substances status: Secondary | ICD-10-CM

## 2020-02-12 DIAGNOSIS — I10 Essential (primary) hypertension: Secondary | ICD-10-CM | POA: Diagnosis not present

## 2020-02-12 DIAGNOSIS — Z8262 Family history of osteoporosis: Secondary | ICD-10-CM

## 2020-02-12 DIAGNOSIS — Z90710 Acquired absence of both cervix and uterus: Secondary | ICD-10-CM

## 2020-02-12 DIAGNOSIS — M810 Age-related osteoporosis without current pathological fracture: Secondary | ICD-10-CM | POA: Diagnosis present

## 2020-02-12 DIAGNOSIS — I6523 Occlusion and stenosis of bilateral carotid arteries: Secondary | ICD-10-CM | POA: Diagnosis present

## 2020-02-12 DIAGNOSIS — Z8249 Family history of ischemic heart disease and other diseases of the circulatory system: Secondary | ICD-10-CM

## 2020-02-12 DIAGNOSIS — I252 Old myocardial infarction: Secondary | ICD-10-CM

## 2020-02-12 DIAGNOSIS — Z923 Personal history of irradiation: Secondary | ICD-10-CM

## 2020-02-12 DIAGNOSIS — Z85828 Personal history of other malignant neoplasm of skin: Secondary | ICD-10-CM

## 2020-02-12 DIAGNOSIS — K5190 Ulcerative colitis, unspecified, without complications: Secondary | ICD-10-CM | POA: Diagnosis present

## 2020-02-12 DIAGNOSIS — K551 Chronic vascular disorders of intestine: Principal | ICD-10-CM | POA: Diagnosis present

## 2020-02-12 DIAGNOSIS — Z87891 Personal history of nicotine dependence: Secondary | ICD-10-CM

## 2020-02-12 DIAGNOSIS — Z881 Allergy status to other antibiotic agents status: Secondary | ICD-10-CM

## 2020-02-12 DIAGNOSIS — Z91013 Allergy to seafood: Secondary | ICD-10-CM

## 2020-02-12 DIAGNOSIS — Z880 Allergy status to penicillin: Secondary | ICD-10-CM

## 2020-02-12 DIAGNOSIS — Z66 Do not resuscitate: Secondary | ICD-10-CM | POA: Diagnosis present

## 2020-02-12 DIAGNOSIS — Z79899 Other long term (current) drug therapy: Secondary | ICD-10-CM

## 2020-02-12 DIAGNOSIS — Z20822 Contact with and (suspected) exposure to covid-19: Secondary | ICD-10-CM | POA: Diagnosis present

## 2020-02-12 DIAGNOSIS — I739 Peripheral vascular disease, unspecified: Secondary | ICD-10-CM | POA: Diagnosis present

## 2020-02-12 DIAGNOSIS — I2510 Atherosclerotic heart disease of native coronary artery without angina pectoris: Secondary | ICD-10-CM | POA: Diagnosis present

## 2020-02-12 DIAGNOSIS — Z89612 Acquired absence of left leg above knee: Secondary | ICD-10-CM

## 2020-02-12 DIAGNOSIS — E785 Hyperlipidemia, unspecified: Secondary | ICD-10-CM | POA: Diagnosis present

## 2020-02-12 DIAGNOSIS — Z23 Encounter for immunization: Secondary | ICD-10-CM

## 2020-02-12 DIAGNOSIS — C499 Malignant neoplasm of connective and soft tissue, unspecified: Secondary | ICD-10-CM | POA: Diagnosis present

## 2020-02-12 DIAGNOSIS — J9811 Atelectasis: Secondary | ICD-10-CM | POA: Diagnosis present

## 2020-02-12 DIAGNOSIS — R079 Chest pain, unspecified: Secondary | ICD-10-CM | POA: Diagnosis not present

## 2020-02-12 LAB — CBC
HCT: 44.7 % (ref 36.0–46.0)
Hemoglobin: 15.3 g/dL — ABNORMAL HIGH (ref 12.0–15.0)
MCH: 32.3 pg (ref 26.0–34.0)
MCHC: 34.2 g/dL (ref 30.0–36.0)
MCV: 94.3 fL (ref 80.0–100.0)
Platelets: 246 10*3/uL (ref 150–400)
RBC: 4.74 MIL/uL (ref 3.87–5.11)
RDW: 13.1 % (ref 11.5–15.5)
WBC: 9.1 10*3/uL (ref 4.0–10.5)
nRBC: 0 % (ref 0.0–0.2)

## 2020-02-12 LAB — BASIC METABOLIC PANEL
Anion gap: 13 (ref 5–15)
BUN: 16 mg/dL (ref 8–23)
CO2: 26 mmol/L (ref 22–32)
Calcium: 9.4 mg/dL (ref 8.9–10.3)
Chloride: 98 mmol/L (ref 98–111)
Creatinine, Ser: 0.37 mg/dL — ABNORMAL LOW (ref 0.44–1.00)
GFR calc Af Amer: >60 mL/min (ref >60)
GFR calc non Af Amer: >60 mL/min (ref >60)
Glucose, Bld: 97 mg/dL (ref 70–99)
Potassium: 3.6 mmol/L (ref 3.5–5.1)
Sodium: 137 mmol/L (ref 135–145)

## 2020-02-12 LAB — TROPONIN I (HIGH SENSITIVITY)
Troponin I (High Sensitivity): 6 ng/L (ref <18)
Troponin I (High Sensitivity): 7 ng/L (ref <18)

## 2020-02-12 LAB — PROTIME-INR
INR: 0.8 (ref 0.8–1.2)
Prothrombin Time: 11.1 seconds — ABNORMAL LOW (ref 11.4–15.2)

## 2020-02-12 NOTE — ED Triage Notes (Signed)
Pt arrives via pov c/o chest pain starting a few days ago, increasing today and reports pain radiating up into left neck, and arm. C/o SOB starting today. Pt denies NVD. NAD noted at this time. Pt in personal wheelchair.

## 2020-02-12 NOTE — Telephone Encounter (Signed)
Pt called stating to agent she felt she was having a heart attack and her daughter was coming to take her to ED.  Once connected to triage, daughter had arrived to home. Pt reports CP and chest "heaviness." Did not attempt further triage. Speech non-halting during call , did not sound distressed. Pt states she was calling to see if PCP could call ahead to ED so she would not have to wait. Advised she would be triaged in ED and seen in appropriate manner.  Assured NT would route to PCP for review.  Pt verbalizes understanding.  Offered EMS, pt had left call.

## 2020-02-12 NOTE — Telephone Encounter (Signed)
Patient daughter left a message that her mother was having some issues and request someone to call her mom. I spoke with patient and she informed that she has being having a nagging pain on the left side under her breast and ribs since yesterday. The patient informed that she was not sweating,no blurred vision,or nausea. The patient wanted to see if the closing her carotid stent would be the cause of what was going on. I spoke with Eulogio Ditch NP and she advise that the carotid stenosis would not caused these symptoms and she should contact her PCP or go to the ED. I left detailed message on patient voicemail with medical advice.

## 2020-02-13 ENCOUNTER — Other Ambulatory Visit: Payer: Self-pay

## 2020-02-13 ENCOUNTER — Emergency Department: Payer: PPO

## 2020-02-13 ENCOUNTER — Ambulatory Visit: Payer: PPO | Admitting: Dermatology

## 2020-02-13 ENCOUNTER — Inpatient Hospital Stay
Admission: EM | Admit: 2020-02-13 | Discharge: 2020-02-15 | DRG: 357 | Disposition: A | Discharge status: Home / Self Care | Payer: PPO | Attending: Internal Medicine | Admitting: Internal Medicine

## 2020-02-13 ENCOUNTER — Encounter
Admission: EM | Disposition: A | Discharge status: Home / Self Care | Payer: Self-pay | Source: Home / Self Care | Attending: Internal Medicine

## 2020-02-13 DIAGNOSIS — Z8249 Family history of ischemic heart disease and other diseases of the circulatory system: Secondary | ICD-10-CM | POA: Diagnosis not present

## 2020-02-13 DIAGNOSIS — K5190 Ulcerative colitis, unspecified, without complications: Secondary | ICD-10-CM | POA: Diagnosis present

## 2020-02-13 DIAGNOSIS — E785 Hyperlipidemia, unspecified: Secondary | ICD-10-CM | POA: Diagnosis not present

## 2020-02-13 DIAGNOSIS — I10 Essential (primary) hypertension: Secondary | ICD-10-CM | POA: Diagnosis present

## 2020-02-13 DIAGNOSIS — Z90710 Acquired absence of both cervix and uterus: Secondary | ICD-10-CM | POA: Diagnosis not present

## 2020-02-13 DIAGNOSIS — I6523 Occlusion and stenosis of bilateral carotid arteries: Secondary | ICD-10-CM | POA: Diagnosis present

## 2020-02-13 DIAGNOSIS — I25118 Atherosclerotic heart disease of native coronary artery with other forms of angina pectoris: Secondary | ICD-10-CM | POA: Diagnosis not present

## 2020-02-13 DIAGNOSIS — I252 Old myocardial infarction: Secondary | ICD-10-CM | POA: Diagnosis not present

## 2020-02-13 DIAGNOSIS — K551 Chronic vascular disorders of intestine: Secondary | ICD-10-CM | POA: Diagnosis not present

## 2020-02-13 DIAGNOSIS — Z23 Encounter for immunization: Secondary | ICD-10-CM | POA: Diagnosis not present

## 2020-02-13 DIAGNOSIS — Z20822 Contact with and (suspected) exposure to covid-19: Secondary | ICD-10-CM | POA: Diagnosis not present

## 2020-02-13 DIAGNOSIS — Z89612 Acquired absence of left leg above knee: Secondary | ICD-10-CM | POA: Diagnosis present

## 2020-02-13 DIAGNOSIS — C499 Malignant neoplasm of connective and soft tissue, unspecified: Secondary | ICD-10-CM | POA: Diagnosis not present

## 2020-02-13 DIAGNOSIS — R079 Chest pain, unspecified: Secondary | ICD-10-CM | POA: Diagnosis not present

## 2020-02-13 DIAGNOSIS — I2510 Atherosclerotic heart disease of native coronary artery without angina pectoris: Secondary | ICD-10-CM | POA: Diagnosis not present

## 2020-02-13 DIAGNOSIS — Z8673 Personal history of transient ischemic attack (TIA), and cerebral infarction without residual deficits: Secondary | ICD-10-CM | POA: Diagnosis not present

## 2020-02-13 DIAGNOSIS — K219 Gastro-esophageal reflux disease without esophagitis: Secondary | ICD-10-CM | POA: Diagnosis not present

## 2020-02-13 DIAGNOSIS — N281 Cyst of kidney, acquired: Secondary | ICD-10-CM | POA: Diagnosis not present

## 2020-02-13 DIAGNOSIS — I739 Peripheral vascular disease, unspecified: Secondary | ICD-10-CM | POA: Diagnosis present

## 2020-02-13 DIAGNOSIS — K5790 Diverticulosis of intestine, part unspecified, without perforation or abscess without bleeding: Secondary | ICD-10-CM | POA: Diagnosis not present

## 2020-02-13 DIAGNOSIS — Z8262 Family history of osteoporosis: Secondary | ICD-10-CM | POA: Diagnosis not present

## 2020-02-13 DIAGNOSIS — Z923 Personal history of irradiation: Secondary | ICD-10-CM | POA: Diagnosis not present

## 2020-02-13 DIAGNOSIS — K559 Vascular disorder of intestine, unspecified: Secondary | ICD-10-CM | POA: Diagnosis present

## 2020-02-13 DIAGNOSIS — M810 Age-related osteoporosis without current pathological fracture: Secondary | ICD-10-CM | POA: Diagnosis not present

## 2020-02-13 DIAGNOSIS — J9811 Atelectasis: Secondary | ICD-10-CM | POA: Diagnosis not present

## 2020-02-13 DIAGNOSIS — Z66 Do not resuscitate: Secondary | ICD-10-CM | POA: Diagnosis not present

## 2020-02-13 DIAGNOSIS — K55059 Acute (reversible) ischemia of intestine, part and extent unspecified: Secondary | ICD-10-CM | POA: Diagnosis not present

## 2020-02-13 DIAGNOSIS — E039 Hypothyroidism, unspecified: Secondary | ICD-10-CM | POA: Diagnosis not present

## 2020-02-13 DIAGNOSIS — Z79899 Other long term (current) drug therapy: Secondary | ICD-10-CM | POA: Diagnosis not present

## 2020-02-13 DIAGNOSIS — Z7902 Long term (current) use of antithrombotics/antiplatelets: Secondary | ICD-10-CM | POA: Diagnosis not present

## 2020-02-13 HISTORY — DX: Cardiac murmur, unspecified: R01.1

## 2020-02-13 HISTORY — DX: Thyrotoxicosis, unspecified without thyrotoxic crisis or storm: E05.90

## 2020-02-13 HISTORY — PX: VISCERAL ARTERY INTERVENTION: CATH118277

## 2020-02-13 HISTORY — DX: Atherosclerotic heart disease of native coronary artery without angina pectoris: I25.10

## 2020-02-13 LAB — HEPATIC FUNCTION PANEL
ALT: 12 U/L (ref 0–44)
AST: 24 U/L (ref 15–41)
Albumin: 4.1 g/dL (ref 3.5–5.0)
Alkaline Phosphatase: 62 U/L (ref 38–126)
Bilirubin, Direct: 0.3 mg/dL — ABNORMAL HIGH (ref 0.0–0.2)
Indirect Bilirubin: 1 mg/dL — ABNORMAL HIGH (ref 0.3–0.9)
Total Bilirubin: 1.3 mg/dL — ABNORMAL HIGH (ref 0.3–1.2)
Total Protein: 7.4 g/dL (ref 6.5–8.1)

## 2020-02-13 LAB — LACTIC ACID, PLASMA: Lactic Acid, Venous: 0.6 mmol/L (ref 0.5–1.9)

## 2020-02-13 LAB — TROPONIN I (HIGH SENSITIVITY): Troponin I (High Sensitivity): 6 ng/L (ref <18)

## 2020-02-13 LAB — LIPASE, BLOOD: Lipase: 27 U/L (ref 11–51)

## 2020-02-13 LAB — SARS CORONAVIRUS 2 BY RT PCR (HOSPITAL ORDER, PERFORMED IN ~~LOC~~ HOSPITAL LAB): SARS Coronavirus 2: NEGATIVE

## 2020-02-13 LAB — APTT: aPTT: >160 seconds (ref 24–36)

## 2020-02-13 SURGERY — VISCERAL ARTERY INTERVENTION
Anesthesia: Moderate Sedation

## 2020-02-13 MED ORDER — SODIUM CHLORIDE 0.9 % IV SOLN: 250.0000 mL | INTRAVENOUS | Status: DC | PRN

## 2020-02-13 MED ORDER — CALCIUM CARBONATE ANTACID 500 MG PO CHEW
1.0000 | CHEWABLE_TABLET | Freq: Two times a day (BID) | ORAL | Status: DC
  Administered 2020-02-13 – 2020-02-15 (×4): 200 mg via ORAL
  Filled 2020-02-13 (×4): qty 1

## 2020-02-13 MED ORDER — ONDANSETRON HCL 4 MG/2ML IJ SOLN: 4.0000 mg | Freq: Four times a day (QID) | INTRAMUSCULAR | Status: DC | PRN

## 2020-02-13 MED ORDER — CLOPIDOGREL BISULFATE 75 MG PO TABS
75.0000 mg | ORAL_TABLET | Freq: Every day | ORAL | Status: DC
  Administered 2020-02-14 – 2020-02-15 (×2): 75 mg via ORAL
  Filled 2020-02-13 (×2): qty 1

## 2020-02-13 MED ORDER — FLUTICASONE PROPIONATE 50 MCG/ACT NA SUSP
1.0000 | Freq: Every day | NASAL | Status: DC
  Administered 2020-02-13 – 2020-02-15 (×3): 1 via NASAL
  Filled 2020-02-13: qty 16

## 2020-02-13 MED ORDER — METHIMAZOLE 5 MG PO TABS
2.5000 mg | ORAL_TABLET | Freq: Every day | ORAL | Status: DC
  Administered 2020-02-13 – 2020-02-15 (×3): 2.5 mg via ORAL
  Filled 2020-02-13 (×3): qty 1

## 2020-02-13 MED ORDER — FENTANYL CITRATE (PF) 100 MCG/2ML IJ SOLN
INTRAMUSCULAR | Status: AC
  Filled 2020-02-13: qty 2

## 2020-02-13 MED ORDER — ROSUVASTATIN CALCIUM 10 MG PO TABS
10.0000 mg | ORAL_TABLET | Freq: Every day | ORAL | Status: DC
  Administered 2020-02-13 – 2020-02-15 (×3): 10 mg via ORAL
  Filled 2020-02-13 (×3): qty 1

## 2020-02-13 MED ORDER — MIDAZOLAM HCL 2 MG/2ML IJ SOLN
INTRAMUSCULAR | Status: DC | PRN
  Administered 2020-02-13: 2 mg via INTRAVENOUS

## 2020-02-13 MED ORDER — ONDANSETRON HCL 4 MG/2ML IJ SOLN
4.0000 mg | Freq: Once | INTRAMUSCULAR | Status: AC
  Administered 2020-02-13: 4 mg via INTRAVENOUS
  Filled 2020-02-13: qty 2

## 2020-02-13 MED ORDER — HEPARIN (PORCINE) 25000 UT/250ML-% IV SOLN
800.0000 [IU]/h | INTRAVENOUS | Status: DC
  Administered 2020-02-13 (×2): 800 [IU]/h via INTRAVENOUS
  Filled 2020-02-13 (×2): qty 250

## 2020-02-13 MED ORDER — INFLUENZA VAC A&B SA ADJ QUAD 0.5 ML IM PRSY
0.5000 mL | PREFILLED_SYRINGE | INTRAMUSCULAR | Status: AC
  Administered 2020-02-14: 0.5 mL via INTRAMUSCULAR
  Filled 2020-02-13 (×2): qty 0.5

## 2020-02-13 MED ORDER — HEPARIN SODIUM (PORCINE) 1000 UNIT/ML IJ SOLN
INTRAMUSCULAR | Status: AC
  Filled 2020-02-13: qty 1

## 2020-02-13 MED ORDER — MIDAZOLAM HCL 5 MG/5ML IJ SOLN
INTRAMUSCULAR | Status: AC
Start: 2020-02-13 — End: 2020-02-13
  Filled 2020-02-13: qty 5

## 2020-02-13 MED ORDER — SODIUM CHLORIDE 0.9% FLUSH
3.0000 mL | INTRAVENOUS | Status: DC | PRN
  Administered 2020-02-14: 3 mL via INTRAVENOUS

## 2020-02-13 MED ORDER — MORPHINE SULFATE (PF) 4 MG/ML IV SOLN
4.0000 mg | Freq: Once | INTRAVENOUS | Status: AC
  Administered 2020-02-13: 4 mg via INTRAVENOUS
  Filled 2020-02-13: qty 1

## 2020-02-13 MED ORDER — HEPARIN SODIUM (PORCINE) 1000 UNIT/ML IJ SOLN
INTRAMUSCULAR | Status: DC | PRN
  Administered 2020-02-13: 5000 [IU] via INTRAVENOUS

## 2020-02-13 MED ORDER — FERROUS SULFATE 325 (65 FE) MG PO TABS
325.0000 mg | ORAL_TABLET | Freq: Every day | ORAL | Status: DC
  Administered 2020-02-13 – 2020-02-15 (×3): 325 mg via ORAL
  Filled 2020-02-13 (×3): qty 1

## 2020-02-13 MED ORDER — ASCORBIC ACID 500 MG PO TABS
500.0000 mg | ORAL_TABLET | Freq: Every day | ORAL | Status: DC
  Administered 2020-02-13 – 2020-02-15 (×3): 500 mg via ORAL
  Filled 2020-02-13 (×3): qty 1

## 2020-02-13 MED ORDER — ACETAMINOPHEN 325 MG PO TABS: 650.0000 mg | ORAL_TABLET | Freq: Four times a day (QID) | ORAL | Status: DC | PRN

## 2020-02-13 MED ORDER — SODIUM CHLORIDE 0.9% FLUSH
3.0000 mL | Freq: Two times a day (BID) | INTRAVENOUS | Status: DC
  Administered 2020-02-13 – 2020-02-15 (×5): 3 mL via INTRAVENOUS

## 2020-02-13 MED ORDER — IOHEXOL 350 MG/ML SOLN
75.0000 mL | Freq: Once | INTRAVENOUS | Status: AC | PRN
  Administered 2020-02-13: 75 mL via INTRAVENOUS

## 2020-02-13 MED ORDER — METHYLPREDNISOLONE SODIUM SUCC 125 MG IJ SOLR
INTRAMUSCULAR | Status: AC
  Filled 2020-02-13: qty 2

## 2020-02-13 MED ORDER — DIPHENHYDRAMINE HCL 50 MG/ML IJ SOLN
INTRAMUSCULAR | Status: AC
  Filled 2020-02-13: qty 1

## 2020-02-13 MED ORDER — MORPHINE SULFATE (PF) 4 MG/ML IV SOLN
2.0000 mg | INTRAVENOUS | Status: DC | PRN
  Administered 2020-02-13: 2 mg via INTRAVENOUS
  Filled 2020-02-13: qty 1

## 2020-02-13 MED ORDER — FENTANYL CITRATE (PF) 100 MCG/2ML IJ SOLN
INTRAMUSCULAR | Status: DC | PRN
Start: 2020-02-13 — End: 2020-02-13
  Administered 2020-02-13: 50 ug via INTRAVENOUS

## 2020-02-13 MED ORDER — MESALAMINE 400 MG PO CPDR
1600.0000 mg | DELAYED_RELEASE_CAPSULE | Freq: Two times a day (BID) | ORAL | Status: DC
  Administered 2020-02-13 – 2020-02-15 (×5): 1600 mg via ORAL
  Filled 2020-02-13 (×6): qty 4

## 2020-02-13 MED ORDER — SODIUM CHLORIDE 0.9 % IV SOLN
1.0000 g | Freq: Once | INTRAVENOUS | Status: AC
  Administered 2020-02-13: 1 g via INTRAVENOUS

## 2020-02-13 MED ORDER — ONDANSETRON HCL 4 MG PO TABS
4.0000 mg | ORAL_TABLET | Freq: Four times a day (QID) | ORAL | Status: DC | PRN
  Filled 2020-02-13: qty 1

## 2020-02-13 MED ORDER — FAMOTIDINE 20 MG PO TABS
ORAL_TABLET | ORAL | Status: AC
Start: 2020-02-13 — End: 2020-02-13
  Filled 2020-02-13: qty 1

## 2020-02-13 MED ORDER — LOSARTAN POTASSIUM 50 MG PO TABS
50.0000 mg | ORAL_TABLET | Freq: Every day | ORAL | Status: DC
  Administered 2020-02-13 – 2020-02-15 (×3): 50 mg via ORAL
  Filled 2020-02-13 (×3): qty 1

## 2020-02-13 MED ORDER — METRONIDAZOLE IN NACL 5-0.79 MG/ML-% IV SOLN
500.0000 mg | Freq: Once | INTRAVENOUS | Status: AC
  Administered 2020-02-13: 500 mg via INTRAVENOUS
  Filled 2020-02-13: qty 100

## 2020-02-13 MED ORDER — HEPARIN BOLUS VIA INFUSION
4000.0000 [IU] | Freq: Once | INTRAVENOUS | Status: AC
  Administered 2020-02-13: 4000 [IU] via INTRAVENOUS
  Filled 2020-02-13: qty 4000

## 2020-02-13 MED ORDER — IODIXANOL 320 MG/ML IV SOLN
INTRAVENOUS | Status: DC | PRN
  Administered 2020-02-13: 40 mL via INTRA_ARTERIAL

## 2020-02-13 MED ORDER — HYOSCYAMINE SULFATE 0.125 MG PO TABS
0.1250 mg | ORAL_TABLET | Freq: Four times a day (QID) | ORAL | Status: DC | PRN
  Filled 2020-02-13: qty 1

## 2020-02-13 MED ORDER — FAMOTIDINE 20 MG PO TABS
20.0000 mg | ORAL_TABLET | Freq: Two times a day (BID) | ORAL | Status: DC
  Administered 2020-02-13 – 2020-02-15 (×5): 20 mg via ORAL
  Filled 2020-02-13 (×5): qty 1

## 2020-02-13 MED ORDER — AMLODIPINE BESYLATE 5 MG PO TABS
5.0000 mg | ORAL_TABLET | Freq: Every day | ORAL | Status: DC
  Administered 2020-02-14 – 2020-02-15 (×2): 5 mg via ORAL
  Filled 2020-02-13 (×2): qty 1

## 2020-02-13 MED ORDER — FENTANYL CITRATE (PF) 100 MCG/2ML IJ SOLN
50.0000 ug | Freq: Once | INTRAMUSCULAR | Status: AC
  Administered 2020-02-13: 50 ug via INTRAVENOUS
  Filled 2020-02-13: qty 2

## 2020-02-13 SURGICAL SUPPLY — 20 items
CATH ANGIO 5F PIGTAIL 65CM (CATHETERS) ×1 IMPLANT
CATH VS15FR (CATHETERS) ×1 IMPLANT
DEVICE STARCLOSE SE CLOSURE (Vascular Products) ×1 IMPLANT
DEVICE TORQUE .025-.038 (MISCELLANEOUS) ×1 IMPLANT
GLIDEWIRE STIFF .35X180X3 HYDR (WIRE) ×1 IMPLANT
GOWN SRG XL 47XLVL 3 (GOWN DISPOSABLE) IMPLANT
GOWN STRL NON-REIN TWL XL LVL3 (GOWN DISPOSABLE) ×2
KIT ENCORE 26 ADVANTAGE (KITS) ×1 IMPLANT
NDL ENTRY 21GA 7CM ECHOTIP (NEEDLE) IMPLANT
NEEDLE ENTRY 21GA 7CM ECHOTIP (NEEDLE) ×2 IMPLANT
PACK ANGIOGRAPHY (CUSTOM PROCEDURE TRAY) ×1 IMPLANT
SET INTRO CAPELLA COAXIAL (SET/KITS/TRAYS/PACK) ×1 IMPLANT
SHEATH ANL2 6FRX45 HC (SHEATH) ×1 IMPLANT
SHEATH BRITE TIP 5FRX11 (SHEATH) ×1 IMPLANT
STENT LIFESTREAM 7X26X135 (Permanent Stent) ×1 IMPLANT
SYR MEDRAD MARK 7 150ML (SYRINGE) ×1 IMPLANT
TOWEL OR 17X26 4PK STRL BLUE (TOWEL DISPOSABLE) ×1 IMPLANT
TUBING CONTRAST HIGH PRESS 72 (TUBING) ×2 IMPLANT
WIRE J 3MM .035X145CM (WIRE) ×1 IMPLANT
WIRE MAGIC TORQUE 260C (WIRE) ×1 IMPLANT

## 2020-02-13 NOTE — Progress Notes (Signed)
ANTICOAGULATION CONSULT NOTE - Initial Consult  Pharmacy Consult for Heparin Indication: mesenteric ischemia  Allergies  Allergen Reactions  . Amoxicillin Anaphylaxis  . Naproxen Hives, Shortness Of Breath, Nausea And Vomiting and Swelling  . Penicillins Shortness Of Breath, Swelling and Rash    Has patient had a PCN reaction causing immediate rash, facial/tongue/throat swelling, SOB or lightheadedness with hypotension: Yes Has patient had a PCN reaction causing severe rash involving mucus membranes or skin necrosis: Yes Has patient had a PCN reaction that required hospitalization: Yes Has patient had a PCN reaction occurring within the last 10 years: No  If all of the above answers are "NO", then may proceed with Cephalosporin use.   . Codeine Nausea And Vomiting  . Levofloxacin Nausea And Vomiting  . Shellfish Allergy Hives, Diarrhea, Nausea And Vomiting and Swelling    Patient Measurements: Height: 5' 1"  (154.9 cm) Weight: 54 kg (119 lb) IBW/kg (Calculated) : 47.8 HEPARIN DW (KG): 54  Vital Signs: Temp: 99.9 F (37.7 C) (09/15 0406) Temp Source: Oral (09/15 0406) BP: 111/58 (09/15 0406) Pulse Rate: 81 (09/15 0406)  Labs: Recent Labs    02/12/20 1520 02/12/20 1842 02/13/20 0440  HGB 15.3*  --   --   HCT 44.7  --   --   PLT 246  --   --   LABPROT 11.1*  --   --   INR 0.8  --   --   CREATININE 0.37*  --   --   TROPONINIHS 6 7 6     Estimated Creatinine Clearance: 43 mL/min (A) (by C-G formula based on SCr of 0.37 mg/dL (L)).   Medical History: Past Medical History:  Diagnosis Date  . Allergy   . Anemia   . Arthritis   . CA of skin 10/15/2014  . Calculus of kidney 10/15/2014   Seen in ER 06/16/10.   . Colitis   . Complication of anesthesia    nausea  . Complication of internal prosthetic device 01/27/2009  . Diverticulitis   . Environmental and seasonal allergies   . Fibrocystic breast disease (FCBD)   . GERD (gastroesophageal reflux disease)   . High  cholesterol   . History of heart artery stent   . Hyperlipidemia   . Hypertension   . Myocardial infarction ([place])   . Osteomyelitis of left femur ([place]) 11/30/2018  . Osteoporosis   . Thyroid disease   . TIA (transient ischemic attack)     Medications:  (Not in a hospital admission)   Assessment: Heparin for mesenteric ischemia.  No anticoagulants PTA.  Baseline labs ordered.  Goal of Therapy:  Heparin level 0.3-0.7 units/ml Monitor platelets by anticoagulation protocol: Yes   Plan:  Heparin bolus 4000 units x 1 then infusion at 800 units/hr Check HL ~ 8 hours after heparin started  Hart Robinsons A 02/13/2020,5:56 AM

## 2020-02-13 NOTE — ED Notes (Signed)
Assumed care of pt upon being roomed, states L sided chest pain and LUQ pain that "pinches." Medicated per MAR, IV obtained, awaiting CT and lab results at this time. AO x4. Talking in full sentences with regular and unlabored breathing. Pt states pain intermittently takes her breath away.

## 2020-02-13 NOTE — Progress Notes (Signed)
ANTICOAGULATION CONSULT NOTE  Pharmacy Consult for Heparin Indication: mesenteric ischemia  Patient Measurements: Height: 5' 1"  (154.9 cm) Weight: 54 kg (119 lb) IBW/kg (Calculated) : 47.8 HEPARIN DW (KG): 54  Vital Signs: Temp: 98.8 F (37.1 C) (09/15 1325) Temp Source: Oral (09/15 1325) BP: 95/54 (09/15 1325) Pulse Rate: 80 (09/15 1325)  Labs: Recent Labs    02/12/20 1520 02/12/20 1842 02/13/20 0440 02/13/20 0554  HGB 15.3*  --   --   --   HCT 44.7  --   --   --   PLT 246  --   --   --   APTT  --   --   --  >160*  LABPROT 11.1*  --   --   --   INR 0.8  --   --   --   CREATININE 0.37*  --   --   --   TROPONINIHS 6 7 6   --     Estimated Creatinine Clearance: 43 mL/min (A) (by C-G formula based on SCr of 0.37 mg/dL (L)).   Medical History: Past Medical History:  Diagnosis Date  . Allergy   . Anemia   . Arthritis   . CA of skin 10/15/2014  . Calculus of kidney 10/15/2014   Seen in ER 06/16/10.   . Colitis   . Complication of anesthesia    nausea  . Complication of internal prosthetic device 01/27/2009  . Diverticulitis   . Environmental and seasonal allergies   . Fibrocystic breast disease (FCBD)   . GERD (gastroesophageal reflux disease)   . High cholesterol   . History of heart artery stent   . Hyperlipidemia   . Hypertension   . Myocardial infarction ([place])   . Osteomyelitis of left femur ([place]) 11/30/2018  . Osteoporosis   . Thyroid disease   . TIA (transient ischemic attack)     Medications:  Medications Prior to Admission  Medication Sig Dispense Refill Last Dose  . amLODipine (NORVASC) 5 MG tablet Take 1 tablet (5 mg total) by mouth daily. 90 tablet 3 02/13/2020 at 0900  . Ascorbic Acid (VITAMIN C) 1000 MG tablet Take 500 mg by mouth daily.    02/12/2020 at 0900  . Calcium-Vitamin D-Vitamin K 500-100-40 MG-UNT-MCG CHEW Chew 1 tablet by mouth 2 (two) times daily. VIACTIV, 500-100-40 (Oral Tablet Chewable)  1 tablet twice a day for 0 days  Quantity:  0.00;  Refills: 0   Ordered :13-Apr-2010  Celene Kras, MA, Anastasiya ;  Started 29-October-2008 Active Comments: DX: 733.00   02/12/2020 at 0900  . clopidogrel (PLAVIX) 75 MG tablet Take 1 tablet (75 mg total) by mouth daily. 90 tablet 3 02/12/2020 at 0900  . famotidine (PEPCID) 20 MG tablet TAKE 1 TABLET BY MOUTH 2 TIMES DAILY (Patient taking differently: Take 20 mg by mouth 2 (two) times daily. ) 180 tablet 0 02/12/2020 at 0900  . ferrous sulfate 325 (65 FE) MG EC tablet Take 325 mg by mouth daily. FERROUS SULFATE, 325 (65 Fe)MG (Oral Tablet Delayed Release)  1 every day for 0 days  Quantity: 0.00;  Refills: 0   Ordered :13-Apr-2010  Celene Kras, MA, Anastasiya ;  Started 29-October-2008 Active Comments: DX: 285.9   02/12/2020 at 0900  . fluticasone (FLONASE) 50 MCG/ACT nasal spray USE 2 SPRAYS EACH NOSTRIL DAILY 48 g 3 02/11/2020 at 0900  . losartan (COZAAR) 50 MG tablet Take 1 tablet (50 mg total) by mouth daily. 90 tablet 3 02/12/2020 at 0900  . Mesalamine (ASACOL HD)  800 MG TBEC Take 1,600 mg by mouth 2 (two) times daily.    02/12/2020 at 0900  . methimazole (TAPAZOLE) 5 MG tablet Take 2.5 mg by mouth daily.    02/12/2020 at 0900  . rosuvastatin (CRESTOR) 20 MG tablet Take 0.5 tablets (10 mg total) by mouth daily. 45 tablet 3 02/12/2020 at Unknown time  . acetaminophen (TYLENOL) 500 MG tablet Take 500 mg by mouth every 6 (six) hours as needed. (Patient not taking: Reported on 11/26/2019)   Not Taking at Unknown time  . B COMPLEX VITAMINS PO Take 1 tablet by mouth daily. B COMPLEX (Oral Capsule)  1 Every Day for 0 days  Quantity: 0.00;  Refills: 0   Ordered :13-Apr-2010  Celene Kras, MA, Anastasiya ;  Started 29-October-2008 Active Comments: DX: 414.00 (Patient not taking: Reported on 11/26/2019)   Not Taking at Unknown time  . Cranberry 300 MG tablet Take 300 mg by mouth daily.  (Patient not taking: Reported on 02/13/2020)   Not Taking at Unknown time  . docusate sodium (COLACE) 100 MG capsule Take 100 mg by mouth 2  (two) times daily. (Patient not taking: Reported on 11/26/2019)     . ezetimibe (ZETIA) 10 MG tablet Take 1 tablet (10 mg total) by mouth daily. (Patient not taking: Reported on 02/13/2020) 90 tablet 3 Not Taking  . fexofenadine (ALLEGRA) 180 MG tablet Take 180 mg by mouth at bedtime.    02/11/2020  . hyoscyamine (LEVSIN, ANASPAZ) 0.125 MG tablet Take by mouth.     . montelukast (SINGULAIR) 10 MG tablet Take 1 tablet (10 mg total) by mouth at bedtime. (Patient not taking: Reported on 02/13/2020) 90 tablet 1 Not Taking at Unknown time  . Multiple Vitamins-Minerals (CENTRUM SILVER PO) Take 1 capsule by mouth daily. (Patient not taking: Reported on 11/26/2019)   Not Taking at Unknown time  . OxyCODONE HCl, Abuse Deter, (OXAYDO) 5 MG TABA Take by mouth. (Patient not taking: Reported on 11/26/2019)   Not Taking at Unknown time    Assessment: 79 y.o. female with medical history significant for coronary artery disease, history of sarcoma status post radiation, history of peripheral arterial disease status post left above-the-knee amputation, history of hypertension and dyslipidemia now s/p angiogram of the SMA by Dr Delana Meyer today.  No anticoagulants PTA.  Baseline labs WNL. Heparin was stopped during the aforementioned procedure for approximately 5 hours  Goal of Therapy:  Heparin level 0.3-0.7 units/ml Monitor platelets by anticoagulation protocol: Yes   Plan:   Restart heparin infusion at 800 units/hr  Check heparin level in 8 hours after heparin restarted  CBC in am  Dallie Piles 02/13/2020,1:41 PM

## 2020-02-13 NOTE — Op Note (Signed)
[facility] Percutaneous Study/Intervention Procedural Note   Date: 02/13/2020  Surgeon(s): Leotis Pain, MD  Assistants: none  Pre-operative Diagnosis: 1.  Acute on chronic mesenteric ischemia 2.  Celiac and SMA stenosis on CT scan   Post-operative diagnosis: Same  Procedure(s) Performed: 1. Ultrasound guidance for vascular access right femoral artery  2. Catheter placement into SMA from right femoral approach 3. Aortogram and selective angiogram of the SMA 4. Stent to the SMA with 7 mm diameter x 26 mm length lifestream balloon expandable stent 5. StarClose closure device right femoral artery  Contrast: 40  Fluoro time: 3 minutes  EBL: 5 cc  Assistant: Ella Jubilee, MD  Anesthesia: Approximately 26 minutes of Moderate conscious sedation using 2 mg of Versed and 50 mcg of Fentanyl  Indications: Patient is a 79 y.o. female who has symptoms consistent with mesenteric ischemia. The patient has a CT scan showing significant celiac and SMA disease. The patient is brought in for angiography for further evaluation and potential treatment. Risks and benefits are discussed and informed consent is obtained.  Dr. Delana Meyer assisted on the case given the complex nature of an acute on chronic mesenteric event and her severe disease.  Procedure: The patient was identified and appropriate procedural time out was performed. The patient was then placed supine on the table and prepped and draped in the usual sterile fashion.Moderate conscious sedation was administered during a face to face encounter with the patient throughout the procedure with my supervision of the RN administering medicines and monitoring the patient's vital signs, pulse oximetry, telemetry and mental status throughout from the start of the procedure until the patient was taken to the recovery room.  An assistant was present due  to the complex nature of an acute on chronic mesenteric event and he assisted with wire and catheter exchanges, optimizing imaging and lighting, as well as assisting with visualization and opinions on the lesions.  Ultrasound was used to evaluate the right common femoral artery. It was patent . A digital ultrasound image was acquired. A Seldinger needle was used to access the right common femoral artery under direct ultrasound guidance and a permanent image was performed. A 0.035 J wire was advanced without resistance and a 5Fr sheath was placed. Pigtail catheter was placed into the aorta and an AP aortogram was performed. This demonstrated the previously placed left renal stent and aortoiliac stents all appeared to be widely patent.  The celiac and superior mesenteric artery had flow, but the origins cannot be seen in the AP projection. We transitioned to the lateral projection to image the celiac and SMA. The lateral image demonstrated what appeared to be significant stenosis of both the celiac artery and superior mesenteric artery. The patient was given 5000 units of IV heparin.We upsized to a 6 Fr sheath.  A V S1 catheter was used to selectively cannulate the superior mesenteric artery. This demonstrated a roughly 80% calcific stenosis of the proximal portion of the SMA. Based on her symptoms and these findings, I elected to treat the SMA to try to improve the patient's clinical course. I crossed the lesion without difficulty with a Glidewire advancing the V S1 catheter down.  I then exchanged for a Magic torque wire and placed a 6 [place] Ansell sheath into the superior mesenteric artery. I then used a 7 mm diameter x 26 mm length lifestream balloon expandable stent to perform treatment of the superior mesenteric artery proximal taking care to and this just before the  gastroduodenal artery. I inflated the balloon to 12 atm. On completion angiogram following this, less than 10% residual stenosis was  identified. At this point, I elected to terminate the procedure. The diagnostic catheter was removed. StarClose closure device was deployed in usual fashion with excellent hemostatic result. The patient was taken to the recovery room in stable condition having tolerated the procedure well.    Findings:SMA with a very calcific high-grade stenosis in the proximal segment in the 80% range.  This was seen both on the aortogram as well as the selective image.  The celiac artery also had some degree of stenosis seen on the aortogram but this did not appear quite as severe and certainly not as clinically important as the SMA. Previously placed aortoiliac stents appeared patent as did the left renal artery stent.  Disposition: Patient was taken to the recovery room in stable condition having tolerated the procedure well.  Complications: None  Leotis Pain 02/13/2020 11:54 AM   This note was created with Dragon Medical transcription system. Any errors in dictation are purely unintentional.

## 2020-02-13 NOTE — ED Provider Notes (Signed)
[facility] Emergency Department Provider Note  ____________________________________________  Time seen: Approximately 4:43 AM  I have reviewed the triage vital signs and the nursing notes.   HISTORY  Chief Complaint Chest Pain   HPI Misty Page is a 79 y.o. female with a history of CAD status post MI on Plavix, sarcoma status post radiation, resection complicated by infection leading to amputation of the LLE in 2020, anemia, hypertension hyperlipidemia who presents for evaluation of chest pain.  Patient reports 2 days of progressively worsening left upper quadrant/left lower chest pain.  The pain is sharp, pleuritic, constant, severe, associated with shortness of breath.  Initially in triage patient told her the pain radiated up to the left neck however she describes that that is not the case.  The pain has been constant with no radiation.  No cough or fever, no nausea or vomiting.  No personal or family history of blood clots, no recent travel or immobilization, no leg pain or swelling, no hemoptysis or exogenous hormones.  Past Medical History:  Diagnosis Date  . Allergy   . Anemia   . Arthritis   . CA of skin 10/15/2014  . Calculus of kidney 10/15/2014   Seen in ER 06/16/10.   . Colitis   . Complication of anesthesia    nausea  . Complication of internal prosthetic device 01/27/2009  . Diverticulitis   . Environmental and seasonal allergies   . Fibrocystic breast disease (FCBD)   . GERD (gastroesophageal reflux disease)   . High cholesterol   . History of heart artery stent   . Hyperlipidemia   . Hypertension   . Myocardial infarction ([place])   . Osteomyelitis of left femur ([place]) 11/30/2018  . Osteoporosis   . Thyroid disease   . TIA (transient ischemic attack)     Patient Active Problem List   Diagnosis Date Noted  . Status post above-knee amputation of left lower extremity ([place]) 05/10/2019  . Memory changes 05/10/2019  . Hypokalemia  11/27/2018  . History of sarcoma 05/08/2018  . Pain and swelling of left lower leg 05/01/2018  . 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 History of TIA (transient ischemic attack) 04/18/2017  . Bilateral carotid artery stenosis 02/07/2017  . Allergic rhinitis, seasonal 10/15/2014  . Cardiac murmur 10/15/2014  . History of colon polyps 10/15/2014  . Hyperthyroidism 10/15/2014  . Cannot sleep 10/15/2014  . Herpes zona 10/15/2014  . Ulcerative colitis ([place]) 10/15/2014  . Aortic heart valve narrowing 12/03/2013  . Arteriosclerosis of coronary artery 11/23/2013  . History of MI (myocardial infarction) 11/23/2013  . Peripheral vascular disease ([place]) 11/23/2013  . Diverticulitis of colon 07/21/2009  . Hemorrhoids without complication 08/67/6195  . Absolute anemia 10/29/2008  . Essential (primary) hypertension 10/29/2008  . Acid reflux 10/29/2008  . Hypercholesteremia 10/29/2008  . OP (osteoporosis) 10/29/2008    Past Surgical History:  Procedure Laterality Date  . ABDOMINAL HYSTERECTOMY  2007  . BREAST ENHANCEMENT SURGERY  2004  . BREAST IMPLANT REMOVAL    . BREAST SURGERY  1984   Fibrocystic Disease  . CAROTID ENDARTERECTOMY Left 06/21/2014   Dr. Delana Meyer  . CAROTID ENDARTERECTOMY Right 08/02/2014   Dr. Delana Meyer  . CAROTID PTA/STENT INTERVENTION N/A 06/15/2017   Procedure: CAROTID PTA/STENT INTERVENTION;  Surgeon: Katha Cabal, MD;  Location: [place] [facility];  Service: Cardiovascular;  Laterality: N/A;  . COLONOSCOPY WITH PROPOFOL N/A 07/14/2015   Procedure: COLONOSCOPY WITH PROPOFOL;  Surgeon: Hulen Luster, MD;  Location: [facility] ENDOSCOPY;  Service: Gastroenterology;  Laterality: N/A;  . COLONOSCOPY WITH PROPOFOL N/A 02/08/2018   Procedure: COLONOSCOPY WITH PROPOFOL;  Surgeon: Toledo, Benay Pike, MD;  Location: [facility] ENDOSCOPY;  Service: Gastroenterology;  Laterality: N/A;  . CORONARY ANGIOPLASTY WITH STENT PLACEMENT  1999  . ESOPHAGOGASTRODUODENOSCOPY (EGD) WITH PROPOFOL  N/A 02/08/2018   Procedure: ESOPHAGOGASTRODUODENOSCOPY (EGD) WITH PROPOFOL;  Surgeon: Toledo, Benay Pike, MD;  Location: [facility] ENDOSCOPY;  Service: Gastroenterology;  Laterality: N/A;  . FEMORAL ARTERY STENT  08/2003  . HERNIA REPAIR  2008  . LEG AMPUTATION AT HIP Left 02/28/2019  . RENAL ARTERY STENT  08/2003  . TRANSUMBILICAL AUGMENTATION MAMMAPLASTY      Prior to Admission medications   Medication Sig Start Date End Date Taking? Authorizing Provider  acetaminophen (TYLENOL) 500 MG tablet Take 500 mg by mouth every 6 (six) hours as needed. Patient not taking: Reported on 11/26/2019    [provider]  amLODipine (NORVASC) 5 MG tablet Take 1 tablet (5 mg total) by mouth daily. Patient not taking: Reported on 11/26/2019 10/22/19   Virginia Crews, MD  Ascorbic Acid (VITAMIN C) 1000 MG tablet Take 500 mg by mouth daily.     [provider]  B COMPLEX VITAMINS PO Take 1 tablet by mouth daily. B COMPLEX (Oral Capsule)  1 Every Day for 0 days  Quantity: 0.00;  Refills: 0   Ordered :13-Apr-2010  Celene Kras, MA, Anastasiya ;  Started 29-October-2008 Active Comments: DX: 414.00 Patient not taking: Reported on 11/26/2019 10/29/08   [provider]  Calcium-Vitamin D-Vitamin K 500-100-40 MG-UNT-MCG CHEW Chew 1 tablet by mouth 2 (two) times daily. VIACTIV, 500-100-40 (Oral Tablet Chewable)  1 tablet twice a day for 0 days  Quantity: 0.00;  Refills: 0   Ordered :13-Apr-2010  Celene Kras, MA, Anastasiya ;  Started 29-October-2008 Active Comments: DX: 733.00 10/29/08   [provider]  clopidogrel (PLAVIX) 75 MG tablet Take 1 tablet (75 mg total) by mouth daily. 10/22/19   Virginia Crews, MD  Cranberry 300 MG tablet Take 300 mg by mouth daily.     [provider]  dicyclomine (BENTYL) 10 MG capsule  09/13/18   [provider]  docusate sodium (COLACE) 100 MG capsule Take 100 mg by mouth 2 (two) times daily. Patient not taking: Reported on 11/26/2019     [provider]  ezetimibe (ZETIA) 10 MG tablet Take 1 tablet (10 mg total) by mouth daily. 10/22/19   Virginia Crews, MD  famotidine (PEPCID) 20 MG tablet TAKE 1 TABLET BY MOUTH 2 TIMES DAILY 01/07/20   Mar Daring, [name]  ferrous sulfate 325 (65 FE) MG EC tablet Take 325 mg by mouth daily. FERROUS SULFATE, 325 (65 Fe)MG (Oral Tablet Delayed Release)  1 every day for 0 days  Quantity: 0.00;  Refills: 0   Ordered :13-Apr-2010  Celene Kras, MA, Anastasiya ;  Started 29-October-2008 Active Comments: DX: 285.9 10/29/08   [provider]  fexofenadine (ALLEGRA) 180 MG tablet Take 180 mg by mouth at bedtime.     [provider]  fluticasone (FLONASE) 50 MCG/ACT nasal spray USE 2 SPRAYS EACH NOSTRIL DAILY 10/22/19   Virginia Crews, MD  hyoscyamine (LEVSIN, ANASPAZ) 0.125 MG tablet Take by mouth. 01/16/18   [provider]  losartan (COZAAR) 50 MG tablet Take 1 tablet (50 mg total) by mouth daily. 10/22/19   Virginia Crews, MD  Mesalamine (ASACOL HD) 800 MG TBEC TAKE 2 TABLETS BY MOUTH TWICE A  DAY. 09/26/17   [provider]  methimazole (TAPAZOLE) 5 MG tablet Take 2.5 mg by mouth daily.  03/30/17   [provider]  montelukast (SINGULAIR) 10 MG tablet Take 1 tablet (10 mg total) by mouth at bedtime. 05/26/18   Mar Daring, [name]  Multiple Vitamins-Minerals (CENTRUM SILVER PO) Take 1 capsule by mouth daily. Patient not taking: Reported on 11/26/2019    [provider]  OxyCODONE HCl, Abuse Deter, (OXAYDO) 5 MG TABA Take by mouth. Patient not taking: Reported on 11/26/2019    [provider]  rosuvastatin (CRESTOR) 20 MG tablet Take 0.5 tablets (10 mg total) by mouth daily. 10/22/19   Virginia Crews, MD    Allergies Amoxicillin, Naproxen, Penicillins, Codeine, Levofloxacin, and Shellfish allergy  Family History  Problem Relation Age of Onset  . Hyperlipidemia Mother   . Congestive Heart Failure Mother    . COPD Mother   . Heart disease Mother   . Hypertension Mother   . Osteoporosis Mother   . Pancreatic cancer Father   . Arthritis Sister   . Alzheimer's disease Sister   . Hypertension Sister   . Prostate cancer Brother   . Heart attack Brother   . Stomach cancer Brother   . Kidney failure Brother   . Leukemia Brother   . Emphysema Brother   . Pancreatic cancer Brother   . Stomach cancer Brother   . Breast cancer Paternal Grandmother   . Leukemia Paternal Grandfather   . Heart attack Maternal Uncle   . Stroke Maternal Aunt     Social History Social History   Tobacco Use  . Smoking status: Former Smoker    Quit date: 05/30/1994    Years since quitting: 25.7  . Smokeless tobacco: Never Used  Vaping Use  . Vaping Use: Never used  Substance Use Topics  . Alcohol use: Not Currently    Comment: rare- 1 glass of wine EOW  . Drug use: No    Review of Systems  Constitutional: Negative for fever. Eyes: Negative for visual changes. ENT: Negative for sore throat. Neck: No neck pain  Cardiovascular: + chest pain. Respiratory: Negative for shortness of breath. Gastrointestinal: Negative for abdominal pain, vomiting or diarrhea. Genitourinary: Negative for dysuria. Musculoskeletal: Negative for back pain. Skin: Negative for rash. Neurological: Negative for headaches, weakness or numbness. Psych: No SI or HI  ____________________________________________   PHYSICAL EXAM:  VITAL SIGNS: ED Triage Vitals  Enc Vitals Group     BP 02/12/20 1357 (!) 177/77     Pulse Rate 02/12/20 1357 98     Resp 02/12/20 1357 16     Temp 02/12/20 1357 99.2 F (37.3 C)     Temp Source 02/12/20 1357 Oral     SpO2 02/12/20 1357 96 %     Weight 02/12/20 1500 119 lb (54 kg)     Height 02/12/20 1500 5' 1"  (1.549 m)     Head Circumference --      Peak Flow --      Pain Score 02/12/20 1500 8     Pain Loc --      Pain Edu? --      Excl. in [place]? --     Constitutional: Alert and  oriented. Well appearing and in no apparent distress. HEENT:      Head: Normocephalic and atraumatic.         Eyes: Conjunctivae are normal. Sclera is non-icteric.       Mouth/Throat: Mucous membranes are  moist.       Neck: Supple with no signs of meningismus. Cardiovascular: Regular rate and rhythm. No murmurs, gallops, or rubs. 2+ symmetrical distal pulses are present in all extremities. No JVD. Respiratory: Normal respiratory effort. Lungs are clear to auscultation bilaterally. No wheezes, crackles, or rhonchi.  Gastrointestinal: Soft, tender to palpation the left upper quadrant with no rebound or guard Musculoskeletal: stump of the LLE well appearing. RLE with no swelling  Neurologic: Normal speech and language. Face is symmetric. Moving all extremities. No gross focal neurologic deficits are appreciated. Skin: Skin is warm, dry and intact. No rash noted. Psychiatric: Mood and affect are normal. Speech and behavior are normal.  ____________________________________________   LABS (all labs ordered are listed, but only abnormal results are displayed)  Labs Reviewed  BASIC METABOLIC PANEL - Abnormal; Notable for the following components:      Result Value   Creatinine, Ser 0.37 (*)    All other components within normal limits  CBC - Abnormal; Notable for the following components:   Hemoglobin 15.3 (*)    All other components within normal limits  PROTIME-INR - Abnormal; Notable for the following components:   Prothrombin Time 11.1 (*)    All other components within normal limits  HEPATIC FUNCTION PANEL - Abnormal; Notable for the following components:   Total Bilirubin 1.3 (*)    Bilirubin, Direct 0.3 (*)    Indirect Bilirubin 1.0 (*)    All other components within normal limits  SARS CORONAVIRUS 2 BY RT PCR (HOSPITAL ORDER, [place] [facility])  LIPASE, BLOOD  LACTIC ACID, PLASMA  APTT  HEPARIN LEVEL (UNFRACTIONATED)  TROPONIN I (HIGH SENSITIVITY)   TROPONIN I (HIGH SENSITIVITY)  TROPONIN I (HIGH SENSITIVITY)   ____________________________________________  EKG  ED ECG REPORT I, Rudene Re, the attending physician, personally viewed and interpreted this ECG.  Normal sinus rhythm, rate of 94, normal intervals, normal axis, diffuse ST depressions in inferior and lateral leads.  New when compared to prior.  04:24 -normal sinus rhythm, rate of 89, normal intervals, normal axis, resolution of the ST depression seen on initial EKG. ____________________________________________  RADIOLOGY  I have personally reviewed the images performed during this visit and I agree with the Radiologist's read.   Interpretation by Radiologist:  DG Chest 2 View  Result Date: 02/12/2020 CLINICAL DATA:  Chest pain EXAM: CHEST - 2 VIEW COMPARISON:  May 17, 2019 FINDINGS: The heart size and mediastinal contours are within normal limits. Aortic knob calcifications. Stent is again noted within the right paratracheal stripe. Both lungs are clear. The visualized skeletal structures are unremarkable. IMPRESSION: No active cardiopulmonary disease. Electronically Signed   By: Prudencio Pair M.D.   On: 02/12/2020 16:29   CT Angio Chest PE W and/or Wo Contrast  Result Date: 02/13/2020 CLINICAL DATA:  Chest pain for a few days. EXAM: CT ANGIOGRAPHY CHEST WITH CONTRAST TECHNIQUE: Multidetector CT imaging of the chest was performed using the standard protocol during bolus administration of intravenous contrast. Multiplanar CT image reconstructions and MIPs were obtained to evaluate the vascular anatomy. CONTRAST:  38m OMNIPAQUE IOHEXOL 350 MG/ML SOLN COMPARISON:  None. FINDINGS: Cardiovascular: Borderline heart size. No pericardial effusion. Extensive atherosclerotic plaque of the aorta, great vessels, and coronaries. Brachiocephalic stent is present. Good opacification of the pulmonary arteries with no filling defect when allowing for levels of overall mild  artifact. Mediastinum/Nodes: Negative for adenopathy or pneumomediastinum. Heterogeneous thyroid without enlargement or discrete nodule. Symmetrically inflated breast implants. Lungs/Pleura: Focal  bandlike opacity in the left upper lobe that is stable from PET CT in 2019 and attributed to scarring. Streaky opacity at the lower lobes from atelectasis. There is no edema, consolidation, effusion, or pneumothorax. Upper Abdomen: Reported separately Musculoskeletal: No acute or aggressive finding. Remote T3 compression fracture. Review of the MIP images confirms the above findings. IMPRESSION: 1. Negative for pulmonary embolism or other acute finding. 2. Atelectasis and left upper lobe scarring. 3.  Aortic Atherosclerosis (ICD10-I70.0).  Coronary atherosclerosis. Electronically Signed   By: Monte Fantasia M.D.   On: 02/13/2020 05:31   CT ABDOMEN PELVIS W CONTRAST  Result Date: 02/13/2020 CLINICAL DATA:  Unspecified abdominal pain EXAM: CT ABDOMEN AND PELVIS WITH CONTRAST TECHNIQUE: Multidetector CT imaging of the abdomen and pelvis was performed using the standard protocol following bolus administration of intravenous contrast. CONTRAST:  41m OMNIPAQUE IOHEXOL 350 MG/ML SOLN COMPARISON:  08/03/2009 CTA. FINDINGS: Lower chest:  Reported separately. Hepatobiliary: No focal liver abnormality.No evidence of biliary obstruction or stone. Pancreas: Unremarkable. Spleen: Unremarkable. Adrenals/Urinary Tract: Negative adrenals. No hydronephrosis or ureteral stone. 4 cm left renal cyst. There are other tiny cortical low densities. High-density foci at bilateral urinary collecting system could be early excretion of contrast given the preceding CTA. Small renal calculi are not excluded. Unremarkable bladder. Stomach/Bowel: Numerous colonic diverticula. No active inflammation. No bowel obstruction. Negative for appendicitis. Vascular/Lymphatic: Extensive atheromatous plaque, status post bilateral iliac and left renal artery  stenting. Proximally, the right iliac stent is compressed by the kissing stent, also seen in 2012. No acute occlusion is seen. There is high-grade narrowing at the SMA and celiac ostia from atheromatous plaque. No mass or adenopathy. Reproductive:Hysterectomy. Other: No ascites or pneumoperitoneum. Musculoskeletal: Left lower extremity amputation with hip disarticulation. IMPRESSION: 1. No acute intra-abdominal finding. 2. Colonic diverticulosis. 3. Severe atherosclerosis with high-grade narrowing at the celiac and SMA ostia. Electronically Signed   By: JMonte FantasiaM.D.   On: 02/13/2020 05:37     ____________________________________________   PROCEDURES  Procedure(s) performed:yes .1-3 Lead EKG Interpretation Performed by: VRudene Re MD Authorized by: VRudene Re MD     Interpretation: normal     ECG rate assessment: normal     Rhythm: sinus rhythm     Ectopy: none     Critical Care performed: yes  CRITICAL CARE Performed by: CRudene Re ?  Total critical care time: 45 min  Critical care time was exclusive of separately billable procedures and treating other patients.  Critical care was necessary to treat or prevent imminent or life-threatening deterioration.  Critical care was time spent personally by me on the following activities: development of treatment plan with patient and/or surrogate as well as nursing, discussions with consultants, evaluation of patient's response to treatment, examination of patient, obtaining history from patient or surrogate, ordering and performing treatments and interventions, ordering and review of laboratory studies, ordering and review of radiographic studies, pulse oximetry and re-evaluation of patient's condition.  ____________________________________________   INITIAL IMPRESSION / ASSESSMENT AND PLAN / ED COURSE  79y.o. female with a history of CAD status post MI on Plavix, sarcoma status post radiation,  resection complicated by infection leading to amputation of the LLE in 2020, anemia, hypertension hyperlipidemia who presents for evaluation of L lower chest/ LUQ abd pain x 2 days.  Patient looks uncomfortable due to the pain with normal vital signs, normal work of breathing and normal sats, lungs are clear to auscultation, heart regular rate and rhythm, abdomen is tender to palpation  the left upper quadrant with no rebound or guarding.  Initial EKG showing diffuse minimal ST depressions with no ST elevation which are new when compared to prior.  Repeat EKG when patient arrived in the room showed resolution of these findings.  Differential diagnoses including ACS versus PE versus peptic ulcer disease versus diverticulitis versus bowel perforation versus pancreatitis versus pneumonia  HS-troponin negative x 2.  Normal kidney function, no electrolyte derangements, no leukocytosis, no anemia.  Chest x-ray with no edema, pneumothorax, infiltrate, confirmed by radiology.  Will get CTc/a/p. Will give IV fentanyl and zofran for symptoms.  Patient placed on telemetry for close monitoring.  Old medical records reviewed.  _________________________ 3:81 AM on 02/13/2020 -----------------------------------------  CT chest clear.  CT abdomen pelvis findings severe atherosclerosis with high-grade narrowing of the celiac and SMA.  Patient is a vasculopath and has several stents in her coronary arteries, bilateral iliacs and right renal artery.  She does have pain out of proportion to exam.  Without any other findings her presentation is most likely concerning for mesenteric ischemia.  Will give IV antibiotics, start patient on IV heparin, consult vascular surgery and hospitalist for admission.  Patient continues to complain of pretty significant pain after IV fentanyl.  Will give IV morphine.   _________________________ 6:19 AM on 02/13/2020 -----------------------------------------  Discussed with Dr. Delana Meyer  from vascular who agrees with management and will consult. Dr. Sidney Ace accepted patient's care to his service.  _____________________________________________ Please note:  Patient was evaluated in Emergency Department today for the symptoms described in the history of present illness. Patient was evaluated in the context of the global COVID-19 pandemic, which necessitated consideration that the patient might be at risk for infection with the SARS-CoV-2 virus that causes COVID-19. Institutional protocols and algorithms that pertain to the evaluation of patients at risk for COVID-19 are in a state of rapid change based on information released by regulatory bodies including the CDC and federal and state organizations. These policies and algorithms were followed during the patient's care in the ED.  Some ED evaluations and interventions may be delayed as a result of limited staffing during the pandemic.   [place] Controlled Substance Database was reviewed by me. ____________________________________________   FINAL CLINICAL IMPRESSION(S) / ED DIAGNOSES   Final diagnoses:  Mesenteric ischemia ([place])      NEW MEDICATIONS STARTED DURING THIS VISIT:  ED Discharge Orders    None       Note:  This document was prepared using Dragon voice recognition software and may include unintentional dictation errors.    Alfred Levins, [name], MD 02/13/20 [id number]

## 2020-02-13 NOTE — Consult Note (Signed)
[facility] Vascular Consult Note  MRN : [id number]  Misty Page is a 79 y.o. (Oct 04, 1940) female who presents with chief complaint of  Chief Complaint  Patient presents with  . Chest Pain  .  History of Present Illness:   I am asked to evaluate the patient by Dr Eual Fines for the complaint of abdominal pain with uncertain etiology.  The patient has an extensive history of atherosclerosis s/p multiple interventions and there is concerned about mesenteric ischemia.  She states the pain began about 4 days ago was not all that bad seemed to get better and then over the last 48 hours it has become steady and increasingly intense to the point that she came to the ER.  The patient denies bloody bowel movements or diarrhea.  No history of peptic ulcer disease.   No prior peripheral angiograms or vascular interventions.  The patient denies amaurosis fugax or recent TIA symptoms. There are no recent neurological changes noted. The patient denies claudication symptoms or rest pain symptoms. The patient denies history of DVT, PE or superficial thrombophlebitis. The patient denies recent episodes of angina    Current Facility-Administered Medications  Medication Dose Route Frequency Provider Last Rate Last Admin  . heparin ADULT infusion 100 units/mL (25000 units/249m sodium chloride 0.45%)  800 Units/hr Intravenous Continuous HHart RobinsonsA, RPH 8 mL/hr at 02/13/20 0614 800 Units/hr at 02/13/20 0614  . metroNIDAZOLE (FLAGYL) IVPB 500 mg  500 mg Intravenous Once VAlfred Levins [place] MD 100 mL/hr at 02/13/20 0744 500 mg at 02/13/20 0744   Current Outpatient Medications  Medication Sig Dispense Refill  . acetaminophen (TYLENOL) 500 MG tablet Take 500 mg by mouth every 6 (six) hours as needed. (Patient not taking: Reported on 11/26/2019)    . amLODipine (NORVASC) 5 MG tablet Take 1 tablet (5 mg total) by mouth daily. (Patient not taking: Reported on 11/26/2019) 90 tablet 3  .  Ascorbic Acid (VITAMIN C) 1000 MG tablet Take 500 mg by mouth daily.     . B COMPLEX VITAMINS PO Take 1 tablet by mouth daily. B COMPLEX (Oral Capsule)  1 Every Day for 0 days  Quantity: 0.00;  Refills: 0   Ordered :13-Apr-2010  ACelene Kras MA, Anastasiya ;  Started 29-October-2008 Active Comments: DX: 414.00 (Patient not taking: Reported on 11/26/2019)    . Calcium-Vitamin D-Vitamin K 500-100-40 MG-UNT-MCG CHEW Chew 1 tablet by mouth 2 (two) times daily. VIACTIV, 500-100-40 (Oral Tablet Chewable)  1 tablet twice a day for 0 days  Quantity: 0.00;  Refills: 0   Ordered :13-Apr-2010  ACelene Kras MA, Anastasiya ;  Started 29-October-2008 Active Comments: DX: 733.00    . clopidogrel (PLAVIX) 75 MG tablet Take 1 tablet (75 mg total) by mouth daily. 90 tablet 3  . Cranberry 300 MG tablet Take 300 mg by mouth daily.     .Marland Kitchendocusate sodium (COLACE) 100 MG capsule Take 100 mg by mouth 2 (two) times daily. (Patient not taking: Reported on 11/26/2019)    . ezetimibe (ZETIA) 10 MG tablet Take 1 tablet (10 mg total) by mouth daily. 90 tablet 3  . famotidine (PEPCID) 20 MG tablet TAKE 1 TABLET BY MOUTH 2 TIMES DAILY (Patient taking differently: Take 20 mg by mouth 2 (two) times daily. ) 180 tablet 0  . ferrous sulfate 325 (65 FE) MG EC tablet Take 325 mg by mouth daily. FERROUS SULFATE, 325 (65 Fe)MG (Oral Tablet Delayed Release)  1 every day for 0 days  Quantity: 0.00;  Refills: 0   Ordered :13-Apr-2010  Celene Kras, MA, Anastasiya ;  Started 29-October-2008 Active Comments: DX: 285.9    . fexofenadine (ALLEGRA) 180 MG tablet Take 180 mg by mouth at bedtime.     . fluticasone (FLONASE) 50 MCG/ACT nasal spray USE 2 SPRAYS EACH NOSTRIL DAILY 48 g 3  . hyoscyamine (LEVSIN, ANASPAZ) 0.125 MG tablet Take by mouth.    . losartan (COZAAR) 50 MG tablet Take 1 tablet (50 mg total) by mouth daily. 90 tablet 3  . Mesalamine (ASACOL HD) 800 MG TBEC Take 1,600 mg by mouth 2 (two) times daily.     . methimazole (TAPAZOLE) 5 MG tablet Take  2.5 mg by mouth daily.     . montelukast (SINGULAIR) 10 MG tablet Take 1 tablet (10 mg total) by mouth at bedtime. 90 tablet 1  . Multiple Vitamins-Minerals (CENTRUM SILVER PO) Take 1 capsule by mouth daily. (Patient not taking: Reported on 11/26/2019)    . OxyCODONE HCl, Abuse Deter, (OXAYDO) 5 MG TABA Take by mouth. (Patient not taking: Reported on 11/26/2019)    . rosuvastatin (CRESTOR) 20 MG tablet Take 0.5 tablets (10 mg total) by mouth daily. 45 tablet 3    Past Medical History:  Diagnosis Date  . Allergy   . Anemia   . Arthritis   . CA of skin 10/15/2014  . Calculus of kidney 10/15/2014   Seen in ER 06/16/10.   . Colitis   . Complication of anesthesia    nausea  . Complication of internal prosthetic device 01/27/2009  . Diverticulitis   . Environmental and seasonal allergies   . Fibrocystic breast disease (FCBD)   . GERD (gastroesophageal reflux disease)   . High cholesterol   . History of heart artery stent   . Hyperlipidemia   . Hypertension   . Myocardial infarction ([place])   . Osteomyelitis of left femur ([place]) 11/30/2018  . Osteoporosis   . Thyroid disease   . TIA (transient ischemic attack)     Past Surgical History:  Procedure Laterality Date  . ABDOMINAL HYSTERECTOMY  2007  . BREAST ENHANCEMENT SURGERY  2004  . BREAST IMPLANT REMOVAL    . BREAST SURGERY  1984   Fibrocystic Disease  . CAROTID ENDARTERECTOMY Left 06/21/2014   Dr. Delana Meyer  . CAROTID ENDARTERECTOMY Right 08/02/2014   Dr. Delana Meyer  . CAROTID PTA/STENT INTERVENTION N/A 06/15/2017   Procedure: CAROTID PTA/STENT INTERVENTION;  Surgeon: Katha Cabal, MD;  Location: [facility];  Service: Cardiovascular;  Laterality: N/A;  . COLONOSCOPY WITH PROPOFOL N/A 07/14/2015   Procedure: COLONOSCOPY WITH PROPOFOL;  Surgeon: Hulen Luster, MD;  Location[facility] END[facility]COPY;  Service: Gastroenterology;  Laterality: N/A;  . COLONOSCOPY WITH PROPOFOL N/A 02/08/2018   Procedure: COLONOSCOPY WITH PROPOFOL;  Surgeon:  Toledo, Benay Pike, MD;  Location: [facility] ENDOSCOPY;  Service: Gastroenterology;  Laterality: N/A;  . CORONARY ANGIOPLASTY WITH STENT PLACEMENT  1999  . ESOPHAGOGASTRODUODENOSCOPY (EGD) WITH PROPOFOL N/A 02/08/2018   Procedure: ESOPHAGOGASTRODUODENOSCOPY (EGD) WITH PROPOFOL;  Surgeon: Toledo, Benay Pike, MD;  Location: [facility] ENDOSCOPY;  Service: Gastroenterology;  Laterality: N/A;  . FEMORAL ARTERY STENT  08/2003  . HERNIA REPAIR  2008  . LEG AMPUTATION AT HIP Left 02/28/2019  . RENAL ARTERY STENT  08/2003  . TRANSUMBILICAL AUGMENTATION MAMMAPLASTY      Social History Social History   Tobacco Use  . Smoking status: Former Smoker    Quit date: 05/30/1994    Years since quitting: 25.7  . Smokeless tobacco:  Never Used  Vaping Use  . Vaping Use: Never used  Substance Use Topics  . Alcohol use: Not Currently    Comment: rare- 1 glass of wine EOW  . Drug use: No    Family History Family History  Problem Relation Age of Onset  . Hyperlipidemia Mother   . Congestive Heart Failure Mother   . COPD Mother   . Heart disease Mother   . Hypertension Mother   . Osteoporosis Mother   . Pancreatic cancer Father   . Arthritis Sister   . Alzheimer's disease Sister   . Hypertension Sister   . Prostate cancer Brother   . Heart attack Brother   . Stomach cancer Brother   . Kidney failure Brother   . Leukemia Brother   . Emphysema Brother   . Pancreatic cancer Brother   . Stomach cancer Brother   . Breast cancer Paternal Grandmother   . Leukemia Paternal Grandfather   . Heart attack Maternal Uncle   . Stroke Maternal Aunt   No family history of bleeding/clotting disorders, porphyria or autoimmune disease   Allergies  Allergen Reactions  . Amoxicillin Anaphylaxis  . Naproxen Hives, Shortness Of Breath, Nausea And Vomiting and Swelling  . Penicillins Shortness Of Breath, Swelling and Rash    Has patient had a PCN reaction causing immediate rash, facial/tongue/throat swelling, SOB or  lightheadedness with hypotension: Yes Has patient had a PCN reaction causing severe rash involving mucus membranes or skin necrosis: Yes Has patient had a PCN reaction that required hospitalization: Yes Has patient had a PCN reaction occurring within the last 10 years: No  If all of the above answers are "NO", then may proceed with Cephalosporin use.   . Codeine Nausea And Vomiting  . Levofloxacin Nausea And Vomiting  . Shellfish Allergy Hives, Diarrhea, Nausea And Vomiting and Swelling     REVIEW OF SYSTEMS (Negative unless checked)  Constitutional: [] Weight loss  [] Fever  [] Chills Cardiac: [] Chest pain   [] Chest pressure   [] Palpitations   [] Shortness of breath when laying flat   [] Shortness of breath at rest   [x] Shortness of breath with exertion. Vascular:  [] Pain in legs with walking   [] Pain in legs at rest   [] Pain in legs when laying flat   [] Claudication   [] Pain in feet when walking  [] Pain in feet at rest  [] Pain in feet when laying flat   [] History of DVT   [] Phlebitis   [] Swelling in legs   [] Varicose veins   [] Non-healing ulcers Pulmonary:   [] Uses home oxygen   [] Productive cough   [] Hemoptysis   [] Wheeze  [] COPD   [] Asthma Neurologic:  [] Dizziness  [] Blackouts   [] Seizures   [] History of stroke   [] History of TIA  [] Aphasia   [] Temporary blindness   [] Dysphagia   [] Weakness or numbness in arms   [] Weakness or numbness in legs Musculoskeletal:  [] Arthritis   [x] Joint swelling   [] Joint pain   [] Low back pain Hematologic:  [] Easy bruising  [] Easy bleeding   [] Hypercoagulable state   [] Anemic  [] Hepatitis Gastrointestinal:  [] Blood in stool   [] Vomiting blood  [] Gastroesophageal reflux/heartburn   [] Difficulty swallowing. Genitourinary:  [] Chronic kidney disease   [] Difficult urination  [] Frequent urination  [] Burning with urination   [] Blood in urine Skin:  [] Rashes   [] Ulcers   [] Wounds Psychological:  [] History of anxiety   []  History of major depression.  Physical  Examination  Vitals:   02/12/20 1846 02/12/20 1918 02/13/20 0406 02/13/20  0600  BP: (!) 164/69 (!) 147/79 (!) 111/58 128/77  Pulse: 87 90 81 96  Resp: 17 20 18 20   Temp: 98.8 F (37.1 C)  99.9 F (37.7 C)   TempSrc: Oral  Oral   SpO2: 98% 96% 97% 93%  Weight:      Height:       Body mass index is 22.48 kg/m.  Head: [place]/AT, No temporalis wasting. Prominent temp pulse not noted. Ear/Nose/Throat: Nares w/o erythema or drainage, oropharynx w/o obsrtuction, Mallampati score: class 3.   Eyes: PERRLA, Sclera nonicteric.  Neck: Supple, no nuchal rigidity.  No bruit or JVD.  Pulmonary:  Breath sounds equal bilaterally, no use of accessory muscles.  Cardiac: RRR, normal S1, S2, no Murmurs, rubs or gallops. Vascular: 2+ right DP; left AKA(secondary to CA) Gastrointestinal: soft, non-tender, non-distended.  Musculoskeletal: Moves all extremities.  No deformity or atrophy. No edema. Neurologic: CN 2-12 intact. Symmetrical.  Speech is fluent.  Psychiatric: Judgment intact, Mood & affect appropriate for pt's clinical situation. Dermatologic: No rashes or ulcers noted.  No cellulitis or open wounds.      CBC Lab Results  Component Value Date   WBC 9.1 02/12/2020   HGB 15.3 (H) 02/12/2020   HCT 44.7 02/12/2020   MCV 94.3 02/12/2020   PLT 246 02/12/2020    BMET    Component Value Date/Time   NA 137 02/12/2020 1520   NA 137 10/22/2019 1111   NA 140 06/22/2014 0417   K 3.6 02/12/2020 1520   K 3.5 06/22/2014 0417   CL 98 02/12/2020 1520   CL 105 06/22/2014 0417   CO2 26 02/12/2020 1520   CO2 27 06/22/2014 0417   GLUCOSE 97 02/12/2020 1520   GLUCOSE 92 06/22/2014 0417   BUN 16 02/12/2020 1520   BUN 25 10/22/2019 1111   BUN 9 06/22/2014 0417   CREATININE 0.37 (L) 02/12/2020 1520   CREATININE 0.65 06/22/2014 0417   CALCIUM 9.4 02/12/2020 1520   CALCIUM 7.7 (L) 06/22/2014 0417   GFRNONAA >60 02/12/2020 1520   GFRNONAA >60 06/22/2014 0417   GFRNONAA >60 02/26/2013 1510    GFRAA >60 02/12/2020 1520   GFRAA >60 06/22/2014 0417   GFRAA >60 02/26/2013 1510   Estimated Creatinine Clearance: 43 mL/min (A) (by C-G formula based on SCr of 0.37 mg/dL (L)).  COAG Lab Results  Component Value Date   INR 0.8 02/12/2020   INR 0.9 05/17/2019   INR 1.0 03/23/2019    Radiology I have personally reviewed the CT scan.  She has diffuse atherosclerosis in multiple vascular beds.  There appears to be a 50 to 60% stenosis of the celiac with a greater than 90% stenosis to the SMA.  IMA is not visualized.  Previously placed renal stents as well as the previously placed common iliac stents are patent.   Assessment/Plan 1.  Mesenteric ischemia with critical stenosis of the SMA: We will plan for angiography with the hope for intervention of the SMA for relief of her mesenteric ischemia.  The risks and benefits as well as alternative therapies have been reviewed all questions have been answered patient agrees to proceed.  We will plan for angiography this morning.  2.  Coronary artery disease: Continue cardiac and antihypertensive medications as already ordered and reviewed, no changes at this time.  Continue statin as ordered and reviewed, no changes at this time  Nitrates PRN for chest pain  3.  Hypertension: Continue antihypertensive medications as already ordered, these medications have  been reviewed and there are no changes at this time.  4.  Hyperlipidemia: Continue statin as ordered and reviewed, no changes at this time      Hortencia Pilar, MD  02/13/2020 8:27 AM

## 2020-02-13 NOTE — H&P (Signed)
History and Physical    Misty Page KXF:[id number] DOB: 1940/12/05 DOA: 02/13/2020  PCP: Virginia Crews, MD   Patient coming from: Home   I have personally briefly reviewed patient's old medical records in [place]  Chief Complaint:  Abdominal pain                                 Chest pain  HPI: Misty Page is a 79 y.o. female with medical history significant for coronary artery disease, history of sarcoma status post radiation, history of peripheral arterial disease status post left above-the-knee amputation, history of hypertension and dyslipidemia who presents to the emergency room for evaluation of pain mostly involving the left upper quadrant and left lower chest wall.  Patient has had constant, worsening pain over the last 4 days and describes it as a sharp, pleuritic and severe pain associated with shortness of breath.  She rates her pain an 8 x 10 in intensity at its worst and states that he had initially radiated up to the left side of her neck.  She denies any known alleviating or aggravating factors and denies having any nausea, no vomiting, no changes in her bowel habits, no hematochezia or melena stools.  She denies having any diaphoresis or palpitations. Labs show sodium 137, potassium 3.6, chloride 98, bicarb 26, BUN 16, creatinine 0.37, calcium 9.4, AST 24, ALT 12, troponin 6, lactic acid 0.6, white count 9.1, hemoglobin 15.3, hematocrit 44.7, RDW 13.1, platelet count 246, CT abdomen and pelvis shows colonic diverticulosis.  Severe atherosclerosis with high-grade narrowing at the celiac and SMA ostia. CT angiogram of the chest is negative for pulmonary embolism but shows coronary atherosclerosis Twelve-lead EKG reviewed by me shows sinus rhythm     ED Course: Patient is a 79 year old Caucasian female with a history significant for coronary artery disease, peripheral arterial disease, carotid artery disease who presents to the ER for evaluation of left lower  chest pain/left upper quadrant abdominal pain.  Patient had a CT angiogram of the abdomen and pelvis which showed high-grade narrowing of the celiac and SMA ostia.  Patient was started on heparin drip and vascular surgery consult was requested.  She received a dose of Rocephin and Flagyl in the ER.  She will be admitted to the hospital for further evaluation.  Review of Systems: As per HPI otherwise 10 point review of systems negative.    Past Medical History:  Diagnosis Date  . Allergy   . Anemia   . Arthritis   . CA of skin 10/15/2014  . Calculus of kidney 10/15/2014   Seen in ER 06/16/10.   . Colitis   . Complication of anesthesia    nausea  . Complication of internal prosthetic device 01/27/2009  . Diverticulitis   . Environmental and seasonal allergies   . Fibrocystic breast disease (FCBD)   . GERD (gastroesophageal reflux disease)   . High cholesterol   . History of heart artery stent   . Hyperlipidemia   . Hypertension   . Myocardial infarction ([place])   . Osteomyelitis of left femur ([place]) 11/30/2018  . Osteoporosis   . Thyroid disease   . TIA (transient ischemic attack)     Past Surgical History:  Procedure Laterality Date  . ABDOMINAL HYSTERECTOMY  2007  . BREAST ENHANCEMENT SURGERY  2004  . BREAST IMPLANT REMOVAL    . BREAST SURGERY  1984   Fibrocystic  Disease  . CAROTID ENDARTERECTOMY Left 06/21/2014   Dr. Delana Meyer  . CAROTID ENDARTERECTOMY Right 08/02/2014   Dr. Delana Meyer  . CAROTID PTA/STENT INTERVENTION N/A 06/15/2017   Procedure: CAROTID PTA/STENT INTERVENTION;  Surgeon: Katha Cabal, MD;  Location: [facility];  Service: Cardiovascular;  Laterality: N/A;  . COLONOSCOPY WITH PROPOFOL N/A 07/14/2015   Procedure: COLONOSCOPY WITH PROPOFOL;  Surgeon: Hulen Luster, MD;  Location: [facility] ENDOSCOPY;  Service: Gastroenterology;  Laterality: N/A;  . COLONOSCOPY WITH PROPOFOL N/A 02/08/2018   Procedure: COLONOSCOPY WITH PROPOFOL;  Surgeon: Toledo, Benay Pike, MD;   Location: [facility] ENDOSCOPY;  Service: Gastroenterology;  Laterality: N/A;  . CORONARY ANGIOPLASTY WITH STENT PLACEMENT  1999  . ESOPHAGOGASTRODUODENOSCOPY (EGD) WITH PROPOFOL N/A 02/08/2018   Procedure: ESOPHAGOGASTRODUODENOSCOPY (EGD) WITH PROPOFOL;  Surgeon: Toledo, Benay Pike, MD;  Location: [facility] ENDOSCOPY;  Service: Gastroenterology;  Laterality: N/A;  . FEMORAL ARTERY STENT  08/2003  . HERNIA REPAIR  2008  . LEG AMPUTATION AT HIP Left 02/28/2019  . RENAL ARTERY STENT  08/2003  . TRANSUMBILICAL AUGMENTATION MAMMAPLASTY       reports that she quit smoking about 25 years ago. She has never used smokeless tobacco. She reports previous alcohol use. She reports that she does not use drugs.  Allergies  Allergen Reactions  . Amoxicillin Anaphylaxis  . Naproxen Hives, Shortness Of Breath, Nausea And Vomiting and Swelling  . Penicillins Shortness Of Breath, Swelling and Rash    Has patient had a PCN reaction causing immediate rash, facial/tongue/throat swelling, SOB or lightheadedness with hypotension: Yes Has patient had a PCN reaction causing severe rash involving mucus membranes or skin necrosis: Yes Has patient had a PCN reaction that required hospitalization: Yes Has patient had a PCN reaction occurring within the last 10 years: No  If all of the above answers are "NO", then may proceed with Cephalosporin use.   . Codeine Nausea And Vomiting  . Levofloxacin Nausea And Vomiting  . Shellfish Allergy Hives, Diarrhea, Nausea And Vomiting and Swelling    Family History  Problem Relation Age of Onset  . Hyperlipidemia Mother   . Congestive Heart Failure Mother   . COPD Mother   . Heart disease Mother   . Hypertension Mother   . Osteoporosis Mother   . Pancreatic cancer Father   . Arthritis Sister   . Alzheimer's disease Sister   . Hypertension Sister   . Prostate cancer Brother   . Heart attack Brother   . Stomach cancer Brother   . Kidney failure Brother   . Leukemia Brother   .  Emphysema Brother   . Pancreatic cancer Brother   . Stomach cancer Brother   . Breast cancer Paternal Grandmother   . Leukemia Paternal Grandfather   . Heart attack Maternal Uncle   . Stroke Maternal Aunt      Prior to Admission medications   Medication Sig Start Date End Date Taking? Authorizing Provider  amLODipine (NORVASC) 5 MG tablet Take 1 tablet (5 mg total) by mouth daily. 10/22/19  Yes [name], Dionne Bucy, MD  Ascorbic Acid (VITAMIN C) 1000 MG tablet Take 500 mg by mouth daily.    Yes [provider]  Calcium-Vitamin D-Vitamin K 500-100-40 MG-UNT-MCG CHEW Chew 1 tablet by mouth 2 (two) times daily. VIACTIV, 500-100-40 (Oral Tablet Chewable)  1 tablet twice a day for 0 days  Quantity: 0.00;  Refills: 0   Ordered :13-Apr-2010  Karoline Caldwell, Anastasiya ;  Started 29-October-2008 Active Comments: DX: 733.00 10/29/08  Yes [provider]  clopidogrel (PLAVIX) 75 MG tablet Take 1 tablet (75 mg total) by mouth daily. 10/22/19  Yes [name], Dionne Bucy, MD  famotidine (PEPCID) 20 MG tablet TAKE 1 TABLET BY MOUTH 2 TIMES DAILY Patient taking differently: Take 20 mg by mouth 2 (two) times daily.  01/07/20  Yes Mar Daring, [name]  ferrous sulfate 325 (65 FE) MG EC tablet Take 325 mg by mouth daily. FERROUS SULFATE, 325 (65 Fe)MG (Oral Tablet Delayed Release)  1 every day for 0 days  Quantity: 0.00;  Refills: 0   Ordered :13-Apr-2010  Celene Kras, MA, Anastasiya ;  Started 29-October-2008 Active Comments: DX: 285.9 10/29/08  Yes [provider]  fluticasone (FLONASE) 50 MCG/ACT nasal spray USE 2 SPRAYS EACH NOSTRIL DAILY 10/22/19  Yes [name], Dionne Bucy, MD  losartan (COZAAR) 50 MG tablet Take 1 tablet (50 mg total) by mouth daily. 10/22/19  Yes [name], Dionne Bucy, MD  Mesalamine (ASACOL HD) 800 MG TBEC Take 1,600 mg by mouth 2 (two) times daily.  09/26/17  Yes [provider]  methimazole (TAPAZOLE) 5 MG tablet Take 2.5 mg by mouth daily.  03/30/17  Yes  [provider]  rosuvastatin (CRESTOR) 20 MG tablet Take 0.5 tablets (10 mg total) by mouth daily. 10/22/19  Yes [name], Dionne Bucy, MD  acetaminophen (TYLENOL) 500 MG tablet Take 500 mg by mouth every 6 (six) hours as needed. Patient not taking: Reported on 11/26/2019    [provider]  B COMPLEX VITAMINS PO Take 1 tablet by mouth daily. B COMPLEX (Oral Capsule)  1 Every Day for 0 days  Quantity: 0.00;  Refills: 0   Ordered :13-Apr-2010  Celene Kras, MA, Anastasiya ;  Started 29-October-2008 Active Comments: DX: 414.00 Patient not taking: Reported on 11/26/2019 10/29/08   [provider]  Cranberry 300 MG tablet Take 300 mg by mouth daily.  Patient not taking: Reported on 02/13/2020    [provider]  docusate sodium (COLACE) 100 MG capsule Take 100 mg by mouth 2 (two) times daily. Patient not taking: Reported on 11/26/2019    [provider]  ezetimibe (ZETIA) 10 MG tablet Take 1 tablet (10 mg total) by mouth daily. Patient not taking: Reported on 02/13/2020 10/22/19   Virginia Crews, MD  fexofenadine (ALLEGRA) 180 MG tablet Take 180 mg by mouth at bedtime.     [provider]  hyoscyamine (LEVSIN, ANASPAZ) 0.125 MG tablet Take by mouth. 01/16/18   [provider]  montelukast (SINGULAIR) 10 MG tablet Take 1 tablet (10 mg total) by mouth at bedtime. Patient not taking: Reported on 02/13/2020 05/26/18   Mar Daring, [name]  Multiple Vitamins-Minerals (CENTRUM SILVER PO) Take 1 capsule by mouth daily. Patient not taking: Reported on 11/26/2019    [provider]  OxyCODONE HCl, Abuse Deter, (OXAYDO) 5 MG TABA Take by mouth. Patient not taking: Reported on 11/26/2019    [provider]    Physical Exam: Vitals:   02/12/20 1918 02/13/20 0406 02/13/20 0600 02/13/20 0920  BP: (!) 147/79 (!) 111/58 128/77 130/64  Pulse: 90 81 96 84  Resp: 20 18 20 18   Temp:  99.9 F (37.7 C)  98.1 F (36.7 C)  TempSrc:  Oral   Oral  SpO2: 96% 97% 93% 99%  Weight:    54 kg  Height:    5' 1"  (1.549 m)     Vitals:   02/12/20 1918 02/13/20 0406 02/13/20 0600 02/13/20 0920  BP: (!) 147/79 Marland Kitchen)  111/58 128/77 130/64  Pulse: 90 81 96 84  Resp: 20 18 20 18   Temp:  99.9 F (37.7 C)  98.1 F (36.7 C)  TempSrc:  Oral  Oral  SpO2: 96% 97% 93% 99%  Weight:    54 kg  Height:    5' 1"  (1.549 m)    Constitutional: NAD, alert and oriented x 3.  Patient appears to be in mild to moderate painful distress Eyes: PERRL, lids and conjunctivae normal ENMT: Mucous membranes are moist.  Neck: normal, supple, no masses, no thyromegaly Respiratory: clear to auscultation bilaterally, no wheezing, no crackles. Normal respiratory effort. No accessory muscle use.  Cardiovascular: Regular rate and rhythm, no murmurs / rubs / gallops. No extremity edema. 2+ pedal pulses. No carotid bruits.  Abdomen: Left upper quadrant tenderness, no masses palpated. No hepatosplenomegaly. Bowel sounds positive.  Musculoskeletal: no clubbing / cyanosis.  Left AKA Skin: no rashes, lesions, ulcers.  Neurologic: No gross focal neurologic deficit. Psychiatric: Normal mood and affect.   Labs on Admission: I have personally reviewed following labs and imaging studies  CBC: Recent Labs  Lab 02/12/20 1520  WBC 9.1  HGB 15.3*  HCT 44.7  MCV 94.3  PLT 088   Basic Metabolic Panel: Recent Labs  Lab 02/12/20 1520  NA 137  K 3.6  CL 98  CO2 26  GLUCOSE 97  BUN 16  CREATININE 0.37*  CALCIUM 9.4   GFR: Estimated Creatinine Clearance: 43 mL/min (A) (by C-G formula based on SCr of 0.37 mg/dL (L)). Liver Function Tests: Recent Labs  Lab 02/13/20 0440  AST 24  ALT 12  ALKPHOS 62  BILITOT 1.3*  PROT 7.4  ALBUMIN 4.1   Recent Labs  Lab 02/13/20 0440  LIPASE 27   No results for input(s): AMMONIA in the last 168 hours. Coagulation Profile: Recent Labs  Lab 02/12/20 1520  INR 0.8   Cardiac Enzymes: No results for input(s): CKTOTAL,  CKMB, CKMBINDEX, TROPONINI in the last 168 hours. BNP (last 3 results) No results for input(s): PROBNP in the last 8760 hours. HbA1C: No results for input(s): HGBA1C in the last 72 hours. CBG: No results for input(s): GLUCAP in the last 168 hours. Lipid Profile: No results for input(s): CHOL, HDL, LDLCALC, TRIG, CHOLHDL, LDLDIRECT in the last 72 hours. Thyroid Function Tests: No results for input(s): TSH, T4TOTAL, FREET4, T3FREE, THYROIDAB in the last 72 hours. Anemia Panel: No results for input(s): VITAMINB12, FOLATE, FERRITIN, TIBC, IRON, RETICCTPCT in the last 72 hours. Urine analysis:    Component Value Date/Time   COLORURINE AMBER (A) 05/17/2019 1704   APPEARANCEUR CLOUDY (A) 05/17/2019 1704   APPEARANCEUR Clear 05/22/2014 2223   LABSPEC 1.016 05/17/2019 1704   LABSPEC 1.024 05/22/2014 2223   PHURINE 5.0 05/17/2019 1704   GLUCOSEU NEGATIVE 05/17/2019 1704   GLUCOSEU Negative 05/22/2014 2223   HGBUR NEGATIVE 05/17/2019 1704   BILIRUBINUR Negative 07/06/2019 1154   BILIRUBINUR Negative 05/22/2014 2223   KETONESUR 5 (A) 05/17/2019 1704   PROTEINUR Negative 07/06/2019 1154   PROTEINUR NEGATIVE 05/17/2019 1704   UROBILINOGEN 0.2 07/06/2019 1154   NITRITE Negative 07/06/2019 1154   NITRITE NEGATIVE 05/17/2019 1704   LEUKOCYTESUR Negative 07/06/2019 1154   LEUKOCYTESUR NEGATIVE 05/17/2019 1704   LEUKOCYTESUR Trace 05/22/2014 2223    Radiological Exams on Admission: DG Chest 2 View  Result Date: 02/12/2020 CLINICAL DATA:  Chest pain EXAM: CHEST - 2 VIEW COMPARISON:  May 17, 2019 FINDINGS: The heart size and mediastinal contours are within normal  limits. Aortic knob calcifications. Stent is again noted within the right paratracheal stripe. Both lungs are clear. The visualized skeletal structures are unremarkable. IMPRESSION: No active cardiopulmonary disease. Electronically Signed   By: Prudencio Pair M.D.   On: 02/12/2020 16:29   CT Angio Chest PE W and/or Wo  Contrast  Result Date: 02/13/2020 CLINICAL DATA:  Chest pain for a few days. EXAM: CT ANGIOGRAPHY CHEST WITH CONTRAST TECHNIQUE: Multidetector CT imaging of the chest was performed using the standard protocol during bolus administration of intravenous contrast. Multiplanar CT image reconstructions and MIPs were obtained to evaluate the vascular anatomy. CONTRAST:  48m OMNIPAQUE IOHEXOL 350 MG/ML SOLN COMPARISON:  None. FINDINGS: Cardiovascular: Borderline heart size. No pericardial effusion. Extensive atherosclerotic plaque of the aorta, great vessels, and coronaries. Brachiocephalic stent is present. Good opacification of the pulmonary arteries with no filling defect when allowing for levels of overall mild artifact. Mediastinum/Nodes: Negative for adenopathy or pneumomediastinum. Heterogeneous thyroid without enlargement or discrete nodule. Symmetrically inflated breast implants. Lungs/Pleura: Focal bandlike opacity in the left upper lobe that is stable from PET CT in 2019 and attributed to scarring. Streaky opacity at the lower lobes from atelectasis. There is no edema, consolidation, effusion, or pneumothorax. Upper Abdomen: Reported separately Musculoskeletal: No acute or aggressive finding. Remote T3 compression fracture. Review of the MIP images confirms the above findings. IMPRESSION: 1. Negative for pulmonary embolism or other acute finding. 2. Atelectasis and left upper lobe scarring. 3.  Aortic Atherosclerosis (ICD10-I70.0).  Coronary atherosclerosis. Electronically Signed   By: JMonte FantasiaM.D.   On: 02/13/2020 05:31   CT ABDOMEN PELVIS W CONTRAST  Result Date: 02/13/2020 CLINICAL DATA:  Unspecified abdominal pain EXAM: CT ABDOMEN AND PELVIS WITH CONTRAST TECHNIQUE: Multidetector CT imaging of the abdomen and pelvis was performed using the standard protocol following bolus administration of intravenous contrast. CONTRAST:  771mOMNIPAQUE IOHEXOL 350 MG/ML SOLN COMPARISON:  08/03/2009 CTA.  FINDINGS: Lower chest:  Reported separately. Hepatobiliary: No focal liver abnormality.No evidence of biliary obstruction or stone. Pancreas: Unremarkable. Spleen: Unremarkable. Adrenals/Urinary Tract: Negative adrenals. No hydronephrosis or ureteral stone. 4 cm left renal cyst. There are other tiny cortical low densities. High-density foci at bilateral urinary collecting system could be early excretion of contrast given the preceding CTA. Small renal calculi are not excluded. Unremarkable bladder. Stomach/Bowel: Numerous colonic diverticula. No active inflammation. No bowel obstruction. Negative for appendicitis. Vascular/Lymphatic: Extensive atheromatous plaque, status post bilateral iliac and left renal artery stenting. Proximally, the right iliac stent is compressed by the kissing stent, also seen in 2012. No acute occlusion is seen. There is high-grade narrowing at the SMA and celiac ostia from atheromatous plaque. No mass or adenopathy. Reproductive:Hysterectomy. Other: No ascites or pneumoperitoneum. Musculoskeletal: Left lower extremity amputation with hip disarticulation. IMPRESSION: 1. No acute intra-abdominal finding. 2. Colonic diverticulosis. 3. Severe atherosclerosis with high-grade narrowing at the celiac and SMA ostia. Electronically Signed   By: JoMonte Fantasia.D.   On: 02/13/2020 05:37    EKG: Independently reviewed.  Normal sinus rhythm  Assessment/Plan Principal Problem:   Mesenteric ischemia (HCC) Active Problems:   Atherosclerosis of coronary artery   Essential (primary) hypertension   Ulcerative colitis ([facility] [place]  Peripheral vascular disease ([facility]  Bilateral carotid artery stenosis   Status post above-knee amputation of left lower extremity ([facility]      Mesenteric ischemia Patient is a vasculopath who presents for evaluation of constant, severe and worsening abdominal pain mostly in the left lower chest wall and left  upper quadrant. She denied having any bloody stools and  denies having nausea or vomiting CTA of the abdomen and pelvis shows severe atherosclerosis with high-grade narrowing at the celiac and SMA ostia Continue heparin drip Morphine for pain control Awaiting vascular surgery consult    Coronary artery disease Patient has no EKG changes and troponin is negative Continue Plavix and statins   Hypothyroidism Continue methimazole   History of ulcerative colitis Continue mesalamine   Hypertension Blood pressure stable on losartan and amlodipine   DVT prophylaxis: Heparin Code Status: DO NOT RESUSCITATE Family Communication: Greater than 50% of time was spent discussing plan of care with patient at the bedside.  She verbalizes understanding and agrees with the plan of care.  CODE STATUS was discussed and she is a DO NOT RESUSCITATE Disposition Plan: Back to previous home environment Consults called: Vascular Surgery    Clint Strupp MD [facility]     02/13/2020, 10:05 AM

## 2020-02-13 NOTE — ED Notes (Signed)
Heparin paused at 0727 due to critical result. Awaiting response by MD at this time.

## 2020-02-14 ENCOUNTER — Encounter: Payer: Self-pay | Admitting: Family Medicine

## 2020-02-14 DIAGNOSIS — K559 Vascular disorder of intestine, unspecified: Secondary | ICD-10-CM

## 2020-02-14 LAB — CBC
HCT: 39 % (ref 36.0–46.0)
HCT: 39.2 % (ref 36.0–46.0)
Hemoglobin: 13.4 g/dL (ref 12.0–15.0)
Hemoglobin: 13.5 g/dL (ref 12.0–15.0)
MCH: 32.3 pg (ref 26.0–34.0)
MCH: 32.5 pg (ref 26.0–34.0)
MCHC: 34.4 g/dL (ref 30.0–36.0)
MCHC: 34.4 g/dL (ref 30.0–36.0)
MCV: 94 fL (ref 80.0–100.0)
MCV: 94.5 fL (ref 80.0–100.0)
Platelets: 244 10*3/uL (ref 150–400)
Platelets: 259 10*3/uL (ref 150–400)
RBC: 4.15 MIL/uL (ref 3.87–5.11)
RBC: 4.15 MIL/uL (ref 3.87–5.11)
RDW: 12.8 % (ref 11.5–15.5)
RDW: 12.9 % (ref 11.5–15.5)
WBC: 7.1 10*3/uL (ref 4.0–10.5)
WBC: 8.7 10*3/uL (ref 4.0–10.5)
nRBC: 0 % (ref 0.0–0.2)
nRBC: 0 % (ref 0.0–0.2)

## 2020-02-14 LAB — HEPARIN LEVEL (UNFRACTIONATED)
Heparin Unfractionated: 0.54 IU/mL (ref 0.30–0.70)
Heparin Unfractionated: 0.82 IU/mL — ABNORMAL HIGH (ref 0.30–0.70)

## 2020-02-14 LAB — BASIC METABOLIC PANEL
Anion gap: 10 (ref 5–15)
BUN: 13 mg/dL (ref 8–23)
CO2: 25 mmol/L (ref 22–32)
Calcium: 8.5 mg/dL — ABNORMAL LOW (ref 8.9–10.3)
Chloride: 101 mmol/L (ref 98–111)
Creatinine, Ser: 0.37 mg/dL — ABNORMAL LOW (ref 0.44–1.00)
GFR calc Af Amer: >60 mL/min (ref >60)
GFR calc non Af Amer: >60 mL/min (ref >60)
Glucose, Bld: 152 mg/dL — ABNORMAL HIGH (ref 70–99)
Potassium: 3.6 mmol/L (ref 3.5–5.1)
Sodium: 136 mmol/L (ref 135–145)

## 2020-02-14 MED ORDER — ASPIRIN EC 81 MG PO TBEC
81.0000 mg | DELAYED_RELEASE_TABLET | Freq: Every day | ORAL | Status: DC
  Administered 2020-02-14 – 2020-02-15 (×2): 81 mg via ORAL
  Filled 2020-02-14 (×2): qty 1

## 2020-02-14 MED ORDER — APIXABAN 5 MG PO TABS: 5.0000 mg | ORAL_TABLET | Freq: Two times a day (BID) | ORAL | Status: DC

## 2020-02-14 NOTE — Progress Notes (Signed)
Progress Note    Misty Page  QBH:[id number] DOB: 10/13/1940  DOA: 02/13/2020 PCP: Virginia Crews, MD      Brief Narrative:    Medical records reviewed and are as summarized below:  Misty Page is a 79 y.o. female       Assessment/Plan:   Principal Problem:   Mesenteric ischemia ([place]) Active Problems:   Atherosclerosis of coronary artery   Essential (primary) hypertension   Ulcerative colitis ([place])   Peripheral vascular disease ([place])   Bilateral carotid artery stenosis   Status post above-knee amputation of left lower extremity ([place])  Body mass index is 22.48 kg/m.   Hyperthyroidism Carotid artery stenosis with stent, s/p carotid endarterectomy CAD with coronary stent Acute on chronic mesenteric ischemia Celiac and SMA stenosis on CT scan   PLAN  S/p revascularization with stent placement to SMA on 02/14/2020 She was on IV heparin infusion until around noon today. Vascular surgeon recommended discontinuation of heparin and treating with aspirin, Plavix and statin.  This has been discussed with the patient.  She said she has tolerated aspirin in the past without any problems.  Continue antihypertensives  Continue methimazole for hyperthyroidism        Diet Order            DIET SOFT Room service appropriate? Yes; Fluid consistency: Thin  Diet effective now                    Consultants:  Vascular surgeon  Procedures:  Stent to SMA    Medications:   . amLODipine  5 mg Oral Daily  . vitamin C  500 mg Oral Daily  . aspirin EC  81 mg Oral Daily  . calcium carbonate  1 tablet Oral BID  . clopidogrel  75 mg Oral Daily  . famotidine  20 mg Oral BID  . ferrous sulfate  325 mg Oral Daily  . fluticasone  1 spray Each Nare Daily  . losartan  50 mg Oral Daily  . Mesalamine  1,600 mg Oral BID  . methimazole  2.5 mg Oral Daily  . rosuvastatin  10 mg Oral Daily  . sodium chloride flush  3 mL Intravenous Q12H   Continuous  Infusions: . sodium chloride       Anti-infectives (From admission, onward)   Start     Dose/Rate Route Frequency Ordered Stop   02/13/20 0615  cefTRIAXone (ROCEPHIN) 1 g in sodium chloride 0.9 % 100 mL IVPB        1 g 200 mL/hr over 30 Minutes Intravenous  Once 02/13/20 0612 02/13/20 0716   02/13/20 0615  metroNIDAZOLE (FLAGYL) IVPB 500 mg        500 mg 100 mL/hr over 60 Minutes Intravenous  Once 02/13/20 0612 02/13/20 0853             Family Communication/Anticipated D/C date and plan/Code Status   DVT prophylaxis:      Code Status: DNR.  CODE STATUS was discussed and patient said she is DNR.  CODE STATUS was changed from full code to DNR in the EMR.  Family Communication: Plan discussed with patient Disposition Plan:    Status is: Inpatient  Remains inpatient appropriate because:Inpatient level of care appropriate due to severity of illness   Dispo: The patient is from: Home              Anticipated d/c is to: Home  Anticipated d/c date is: 1 day              Patient currently is not medically stable to d/c.           Subjective:   No abdominal pain or vomiting.  She is tolerating clear liquid diet.  Objective:    Vitals:   02/14/20 0130 02/14/20 0421 02/14/20 1018 02/14/20 1206  BP: 117/74 123/65 (!) 142/76 138/70  Pulse: 79 70  69  Resp: 18 15  15   Temp: 97.6 F (36.4 C) 97.8 F (36.6 C)  98.7 F (37.1 C)  TempSrc: Oral Oral  Oral  SpO2: 94% 94%  93%  Weight:      Height:       No data found.   Intake/Output Summary (Last 24 hours) at 02/14/2020 1510 Last data filed at 02/14/2020 1300 Gross per 24 hour  Intake 1027.13 ml  Output 1700 ml  Net -672.87 ml   Filed Weights   02/12/20 1500 02/13/20 0920  Weight: 54 kg 54 kg    Exam:  GEN: NAD SKIN: No rash EYES: EOMI ENT: MMM CV: RRR PULM: CTA B ABD: soft, ND, NT, +BS CNS: AAO x 3, non focal EXT: Left AKA.  No edema or tenderness   Data Reviewed:   I have  personally reviewed following labs and imaging studies:  Labs: Labs show the following:   Basic Metabolic Panel: Recent Labs  Lab 02/12/20 1520 02/14/20 0024  NA 137 136  K 3.6 3.6  CL 98 101  CO2 26 25  GLUCOSE 97 152*  BUN 16 13  CREATININE 0.37* 0.37*  CALCIUM 9.4 8.5*   GFR Estimated Creatinine Clearance: 43 mL/min (A) (by C-G formula based on SCr of 0.37 mg/dL (L)). Liver Function Tests: Recent Labs  Lab 02/13/20 0440  AST 24  ALT 12  ALKPHOS 62  BILITOT 1.3*  PROT 7.4  ALBUMIN 4.1   Recent Labs  Lab 02/13/20 0440  LIPASE 27   No results for input(s): AMMONIA in the last 168 hours. Coagulation profile Recent Labs  Lab 02/12/20 1520  INR 0.8    CBC: Recent Labs  Lab 02/12/20 1520 02/14/20 0024 02/14/20 0742  WBC 9.1 7.1 8.7  HGB 15.3* 13.4 13.5  HCT 44.7 39.0 39.2  MCV 94.3 94.0 94.5  PLT 246 244 259   Cardiac Enzymes: No results for input(s): CKTOTAL, CKMB, CKMBINDEX, TROPONINI in the last 168 hours. BNP (last 3 results) No results for input(s): PROBNP in the last 8760 hours. CBG: No results for input(s): GLUCAP in the last 168 hours. D-Dimer: No results for input(s): DDIMER in the last 72 hours. Hgb A1c: No results for input(s): HGBA1C in the last 72 hours. Lipid Profile: No results for input(s): CHOL, HDL, LDLCALC, TRIG, CHOLHDL, LDLDIRECT in the last 72 hours. Thyroid function studies: No results for input(s): TSH, T4TOTAL, T3FREE, THYROIDAB in the last 72 hours.  Invalid input(s): FREET3 Anemia work up: No results for input(s): VITAMINB12, FOLATE, FERRITIN, TIBC, IRON, RETICCTPCT in the last 72 hours. Sepsis Labs: Recent Labs  Lab 02/12/20 1520 02/13/20 0554 02/14/20 0024 02/14/20 0742  WBC 9.1  --  7.1 8.7  LATICACIDVEN  --  0.6  --   --     Microbiology Recent Results (from the past 240 hour(s))  SARS Coronavirus 2 by RT PCR (hospital order, performed in [facility] hospital lab) Nasopharyngeal Nasopharyngeal Swab      Status: None   Collection Time: 02/13/20  5:54  AM   Specimen: Nasopharyngeal Swab  Result Value Ref Range Status   SARS Coronavirus 2 NEGATIVE NEGATIVE Final    Comment: (NOTE) SARS-CoV-2 target nucleic acids are NOT DETECTED.  The SARS-CoV-2 RNA is generally detectable in upper and lower respiratory specimens during the acute phase of infection. The lowest concentration of SARS-CoV-2 viral copies this assay can detect is 250 copies / mL. A negative result does not preclude SARS-CoV-2 infection and should not be used as the sole basis for treatment or other patient management decisions.  A negative result may occur with improper specimen collection / handling, submission of specimen other than nasopharyngeal swab, presence of viral mutation(s) within the areas targeted by this assay, and inadequate number of viral copies (<250 copies / mL). A negative result must be combined with clinical observations, patient history, and epidemiological information.  Fact Sheet for Patients:   StrictlyIdeas.no  Fact Sheet for Healthcare Providers: BankingDealers.co.za  This test is not yet approved or  cleared by the [place] FDA and has been authorized for detection and/or diagnosis of SARS-CoV-2 by FDA under an Emergency Use Authorization (EUA).  This EUA will remain in effect (meaning this test can be used) for the duration of the COVID-19 declaration under Section 564(b)(1) of the Act, 21 U.S.C. section 360bbb-3(b)(1), unless the authorization is terminated or revoked sooner.  Performed at [facility], [address] [place], [place] [address]     Procedures and diagnostic studies:  DG Chest 2 View  Result Date: 02/12/2020 CLINICAL DATA:  Chest pain EXAM: CHEST - 2 VIEW COMPARISON:  May 17, 2019 FINDINGS: The heart size and mediastinal contours are within normal limits. Aortic knob calcifications. Stent is again noted  within the right paratracheal stripe. Both lungs are clear. The visualized skeletal structures are unremarkable. IMPRESSION: No active cardiopulmonary disease. Electronically Signed   By: Prudencio Pair M.D.   On: 02/12/2020 16:29   CT Angio Chest PE W and/or Wo Contrast  Result Date: 02/13/2020 CLINICAL DATA:  Chest pain for a few days. EXAM: CT ANGIOGRAPHY CHEST WITH CONTRAST TECHNIQUE: Multidetector CT imaging of the chest was performed using the standard protocol during bolus administration of intravenous contrast. Multiplanar CT image reconstructions and MIPs were obtained to evaluate the vascular anatomy. CONTRAST:  23m OMNIPAQUE IOHEXOL 350 MG/ML SOLN COMPARISON:  None. FINDINGS: Cardiovascular: Borderline heart size. No pericardial effusion. Extensive atherosclerotic plaque of the aorta, great vessels, and coronaries. Brachiocephalic stent is present. Good opacification of the pulmonary arteries with no filling defect when allowing for levels of overall mild artifact. Mediastinum/Nodes: Negative for adenopathy or pneumomediastinum. Heterogeneous thyroid without enlargement or discrete nodule. Symmetrically inflated breast implants. Lungs/Pleura: Focal bandlike opacity in the left upper lobe that is stable from PET CT in 2019 and attributed to scarring. Streaky opacity at the lower lobes from atelectasis. There is no edema, consolidation, effusion, or pneumothorax. Upper Abdomen: Reported separately Musculoskeletal: No acute or aggressive finding. Remote T3 compression fracture. Review of the MIP images confirms the above findings. IMPRESSION: 1. Negative for pulmonary embolism or other acute finding. 2. Atelectasis and left upper lobe scarring. 3.  Aortic Atherosclerosis (ICD10-I70.0).  Coronary atherosclerosis. Electronically Signed   By: JMonte FantasiaM.D.   On: 02/13/2020 05:31   CT ABDOMEN PELVIS W CONTRAST  Result Date: 02/13/2020 CLINICAL DATA:  Unspecified abdominal pain EXAM: CT ABDOMEN AND  PELVIS WITH CONTRAST TECHNIQUE: Multidetector CT imaging of the abdomen and pelvis was performed using the standard protocol following bolus administration  of intravenous contrast. CONTRAST:  64m OMNIPAQUE IOHEXOL 350 MG/ML SOLN COMPARISON:  08/03/2009 CTA. FINDINGS: Lower chest:  Reported separately. Hepatobiliary: No focal liver abnormality.No evidence of biliary obstruction or stone. Pancreas: Unremarkable. Spleen: Unremarkable. Adrenals/Urinary Tract: Negative adrenals. No hydronephrosis or ureteral stone. 4 cm left renal cyst. There are other tiny cortical low densities. High-density foci at bilateral urinary collecting system could be early excretion of contrast given the preceding CTA. Small renal calculi are not excluded. Unremarkable bladder. Stomach/Bowel: Numerous colonic diverticula. No active inflammation. No bowel obstruction. Negative for appendicitis. Vascular/Lymphatic: Extensive atheromatous plaque, status post bilateral iliac and left renal artery stenting. Proximally, the right iliac stent is compressed by the kissing stent, also seen in 2012. No acute occlusion is seen. There is high-grade narrowing at the SMA and celiac ostia from atheromatous plaque. No mass or adenopathy. Reproductive:Hysterectomy. Other: No ascites or pneumoperitoneum. Musculoskeletal: Left lower extremity amputation with hip disarticulation. IMPRESSION: 1. No acute intra-abdominal finding. 2. Colonic diverticulosis. 3. Severe atherosclerosis with high-grade narrowing at the celiac and SMA ostia. Electronically Signed   By: JMonte FantasiaM.D.   On: 02/13/2020 05:37   PERIPHERAL VASCULAR CATHETERIZATION  Result Date: 02/13/2020 See op note              LOS: 1 day   Aylen Rambert  [facility]   Pager on [email or web address] If 7PM-7AM, please contact night-coverage at [email or web address]     02/14/2020, 3:10 PM

## 2020-02-14 NOTE — Telephone Encounter (Signed)
Noted. Glad she went to ER.

## 2020-02-14 NOTE — Progress Notes (Addendum)
[facility] Daily Progress Note   Subjective: 02/13/20: 1. Ultrasound guidance for vascular access right femoral artery  2. Catheter placement into SMA from right femoral approach 3. Aortogram and selective angiogram of the SMA 4. Stent to the SMA with 7 mm diameter x 26 mm length lifestream balloon expandable stent 5. StarClose closure device right femoral artery  Patient with improved abdominal pain this AM. Tolerating clears. No nausea or vomiting.   Objective: Vitals:   02/13/20 2102 02/14/20 0130 02/14/20 0421 02/14/20 1018  BP: 110/72 117/74 123/65 (!) 142/76  Pulse: 81 79 70   Resp: 16 18 15    Temp: 98.1 F (36.7 C) 97.6 F (36.4 C) 97.8 F (36.6 C)   TempSrc: Oral Oral Oral   SpO2: 95% 94% 94%   Weight:      Height:        Intake/Output Summary (Last 24 hours) at 02/14/2020 1151 Last data filed at 02/14/2020 1100 Gross per 24 hour  Intake 787.13 ml  Output 1200 ml  Net -412.87 ml   Physical Exam: A&Ox3, NAD CV: RRR Pulmonary: CTA Bilaterally Abdomen: Soft, Nontender, Nondistended Vascular:  Right Groin: Access Site - clean, dry and intact   Laboratory: CBC    Component Value Date/Time   WBC 8.7 02/14/2020 0742   HGB 13.5 02/14/2020 0742   HGB 14.6 10/22/2019 1111   HCT 39.2 02/14/2020 0742   HCT 43.3 10/22/2019 1111   PLT 259 02/14/2020 0742   PLT 377 10/22/2019 1111   BMET    Component Value Date/Time   NA 136 02/14/2020 0024   NA 137 10/22/2019 1111   NA 140 06/22/2014 0417   K 3.6 02/14/2020 0024   K 3.5 06/22/2014 0417   CL 101 02/14/2020 0024   CL 105 06/22/2014 0417   CO2 25 02/14/2020 0024   CO2 27 06/22/2014 0417   GLUCOSE 152 (H) 02/14/2020 0024   GLUCOSE 92 06/22/2014 0417   BUN 13 02/14/2020 0024   BUN 25 10/22/2019 1111   BUN 9 06/22/2014 0417   CREATININE 0.37 (L) 02/14/2020 0024   CREATININE 0.65 06/22/2014 0417   CALCIUM 8.5 (L)  02/14/2020 0024   CALCIUM 7.7 (L) 06/22/2014 0417   GFRNONAA >60 02/14/2020 0024   GFRNONAA >60 06/22/2014 0417   GFRNONAA >60 02/26/2013 1510   GFRAA >60 02/14/2020 0024   GFRAA >60 06/22/2014 0417   GFRAA >60 02/26/2013 1510   Assessment/Planning: The patient is a 79 year old female who presented with acute on chronic mesenteric s/p mesenteric endovascular intervention - POD#1  1) successful revascularization 2) patient is tolerating clear liquid diet, denies any abdominal pain nausea or vomiting will advance diet today 3) transition from heparin to ASA / Plavix and statin for medical management.  4) encourage ambulation  Discussed with Dr. Eber Hong [facility] [name] 02/14/2020 11:51 AM

## 2020-02-14 NOTE — Progress Notes (Signed)
[place] for Heparin Indication: mesenteric ischemia  Patient Measurements: Height: 5' 1"  (154.9 cm) Weight: 54 kg (119 lb) IBW/kg (Calculated) : 47.8 HEPARIN DW (KG): 54  Vital Signs: Temp: 97.6 F (36.4 C) (09/16 0130) Temp Source: Oral (09/16 0130) BP: 117/74 (09/16 0130) Pulse Rate: 79 (09/16 0130)  Labs: Recent Labs    02/12/20 1520 02/12/20 1842 02/13/20 0440 02/13/20 0554 02/14/20 0024  HGB 15.3*  --   --   --  13.4  HCT 44.7  --   --   --  39.0  PLT 246  --   --   --  244  APTT  --   --   --  >160*  --   LABPROT 11.1*  --   --   --   --   INR 0.8  --   --   --   --   HEPARINUNFRC  --   --   --   --  0.54  CREATININE 0.37*  --   --   --  0.37*  TROPONINIHS 6 7 6   --   --     Estimated Creatinine Clearance: 43 mL/min (A) (by C-G formula based on SCr of 0.37 mg/dL (L)).   Medical History: Past Medical History:  Diagnosis Date  . Allergy   . Anemia   . Arthritis   . CA of skin 10/15/2014  . Calculus of kidney 10/15/2014   Seen in ER 06/16/10.   . Colitis   . Complication of anesthesia    nausea  . Complication of internal prosthetic device 01/27/2009  . Coronary artery disease   . Diverticulitis   . Environmental and seasonal allergies   . Fibrocystic breast disease (FCBD)   . GERD (gastroesophageal reflux disease)   . Heart murmur   . High cholesterol   . History of heart artery stent   . Hyperlipidemia   . Hypertension   . Hyperthyroidism   . Myocardial infarction ([place])   . Osteomyelitis of left femur ([place]) 11/30/2018  . Osteoporosis   . Thyroid disease   . TIA (transient ischemic attack)     Medications:  Medications Prior to Admission  Medication Sig Dispense Refill Last Dose  . amLODipine (NORVASC) 5 MG tablet Take 1 tablet (5 mg total) by mouth daily. 90 tablet 3 02/13/2020 at 0900  . Ascorbic Acid (VITAMIN C) 1000 MG tablet Take 500 mg by mouth daily.    02/12/2020 at 0900  . Calcium-Vitamin D-Vitamin  K 500-100-40 MG-UNT-MCG CHEW Chew 1 tablet by mouth 2 (two) times daily. VIACTIV, 500-100-40 (Oral Tablet Chewable)  1 tablet twice a day for 0 days  Quantity: 0.00;  Refills: 0   Ordered :13-Apr-2010  Celene Kras, MA, Anastasiya ;  Started 29-October-2008 Active Comments: DX: 733.00   02/12/2020 at 0900  . clopidogrel (PLAVIX) 75 MG tablet Take 1 tablet (75 mg total) by mouth daily. 90 tablet 3 02/12/2020 at 0900  . famotidine (PEPCID) 20 MG tablet TAKE 1 TABLET BY MOUTH 2 TIMES DAILY (Patient taking differently: Take 20 mg by mouth 2 (two) times daily. ) 180 tablet 0 02/12/2020 at 0900  . ferrous sulfate 325 (65 FE) MG EC tablet Take 325 mg by mouth daily. FERROUS SULFATE, 325 (65 Fe)MG (Oral Tablet Delayed Release)  1 every day for 0 days  Quantity: 0.00;  Refills: 0   Ordered :13-Apr-2010  Celene Kras, MA, Anastasiya ;  Started 29-October-2008 Active Comments: DX: 285.9   02/12/2020 at  0900  . fluticasone (FLONASE) 50 MCG/ACT nasal spray USE 2 SPRAYS EACH NOSTRIL DAILY 48 g 3 02/11/2020 at 0900  . losartan (COZAAR) 50 MG tablet Take 1 tablet (50 mg total) by mouth daily. 90 tablet 3 02/12/2020 at 0900  . Mesalamine (ASACOL HD) 800 MG TBEC Take 1,600 mg by mouth 2 (two) times daily.    02/12/2020 at 0900  . methimazole (TAPAZOLE) 5 MG tablet Take 2.5 mg by mouth daily.    02/12/2020 at 0900  . rosuvastatin (CRESTOR) 20 MG tablet Take 0.5 tablets (10 mg total) by mouth daily. 45 tablet 3 02/12/2020 at Unknown time  . acetaminophen (TYLENOL) 500 MG tablet Take 500 mg by mouth every 6 (six) hours as needed. (Patient not taking: Reported on 11/26/2019)   Not Taking at Unknown time  . B COMPLEX VITAMINS PO Take 1 tablet by mouth daily. B COMPLEX (Oral Capsule)  1 Every Day for 0 days  Quantity: 0.00;  Refills: 0   Ordered :13-Apr-2010  Celene Kras, MA, Anastasiya ;  Started 29-October-2008 Active Comments: DX: 414.00 (Patient not taking: Reported on 11/26/2019)   Not Taking at Unknown time  . Cranberry 300 MG tablet Take 300  mg by mouth daily.  (Patient not taking: Reported on 02/13/2020)   Not Taking at Unknown time  . docusate sodium (COLACE) 100 MG capsule Take 100 mg by mouth 2 (two) times daily. (Patient not taking: Reported on 11/26/2019)   Not Taking at Unknown time  . ezetimibe (ZETIA) 10 MG tablet Take 1 tablet (10 mg total) by mouth daily. (Patient not taking: Reported on 02/13/2020) 90 tablet 3 Not Taking  . fexofenadine (ALLEGRA) 180 MG tablet Take 180 mg by mouth at bedtime.    02/11/2020  . montelukast (SINGULAIR) 10 MG tablet Take 1 tablet (10 mg total) by mouth at bedtime. (Patient not taking: Reported on 02/13/2020) 90 tablet 1 Not Taking at Unknown time  . Multiple Vitamins-Minerals (CENTRUM SILVER PO) Take 1 capsule by mouth daily. (Patient not taking: Reported on 11/26/2019)   Not Taking at Unknown time  . OxyCODONE HCl, Abuse Deter, (OXAYDO) 5 MG TABA Take by mouth. (Patient not taking: Reported on 11/26/2019)   Not Taking at Unknown time    Assessment: 79 y.o. female with medical history significant for coronary artery disease, history of sarcoma status post radiation, history of peripheral arterial disease status post left above-the-knee amputation, history of hypertension and dyslipidemia now s/p angiogram of the SMA by Dr Delana Meyer today.  No anticoagulants PTA.  Baseline labs WNL. Heparin was stopped during the aforementioned procedure for approximately 5 hours  9/16 0024 HL 0.54, therapeutic x 1.  CBC stable.  Goal of Therapy:  Heparin level 0.3-0.7 units/ml Monitor platelets by anticoagulation protocol: Yes   Plan:   Continue heparin infusion at 800 units/hr  Check heparin level in 8 hours to confirm.   CBC in am  Hart Robinsons A 02/14/2020,1:48 AM

## 2020-02-14 NOTE — Progress Notes (Signed)
Dr Mal Misty wants the patient to continue telemetry at this time

## 2020-02-15 MED ORDER — ASPIRIN 81 MG PO TBEC: 81.0000 mg | DELAYED_RELEASE_TABLET | Freq: Every day | ORAL | Status: DC

## 2020-02-15 NOTE — Progress Notes (Signed)
IVs removed from patient. Discharge instructions given. Verbalized understanding. No acute distress at this time. Patient awaiting daughter to come pick her up. Educated to call for assistance to be wheeled to medical mall upon arrival.

## 2020-02-15 NOTE — Discharge Summary (Signed)
Physician Discharge Summary  Misty Page QIO:[id number] DOB: 1941/03/04 DOA: 02/13/2020  PCP: Virginia Crews, MD  Admit date: 02/13/2020 Discharge date: 02/15/2020  Discharge disposition: Home   Recommendations for Outpatient Follow-Up:   Follow-up with vascular surgeon in 1 week  Discharge Diagnosis:   Principal Problem:   Mesenteric ischemia ([place]) Active Problems:   Atherosclerosis of coronary artery   Essential (primary) hypertension   Ulcerative colitis ([place])   Peripheral vascular disease ([place])   Bilateral carotid artery stenosis   Status post above-knee amputation of left lower extremity ([place])    Discharge Condition: Stable.  Diet recommendation:  Diet Order            Diet - low sodium heart healthy           DIET SOFT Room service appropriate? Yes; Fluid consistency: Thin  Diet effective now                   Code Status: DNR     Hospital Course:   Misty Page is a 79 y.o. female with medical history significant for bilateral carotid artery stenosis, TIA, hyperthyroidism, coronary artery disease, history of sarcoma status post radiation, history of peripheral arterial disease status post left above-the-knee amputation, history of hypertension and dyslipidemia.  She presented to the hospital because of severe abdominal pain and left-sided lower chest pain.  CT abdomen and pelvis showed severe atherosclerosis with high-grade narrowing at the celiac and SMA ostia.  She was admitted to the hospital for acute on chronic mesenteric ischemia with critical stenosis of the SMA.  She was evaluated by the vascular surgeon, Dr. Delana Meyer.  She was treated with IV heparin infusion.  She underwent revascularization with stent placement to SMA on 02/14/2020.  Her condition has improved.  Vascular surgeon recommended dual antiplatelet therapy with aspirin and Plavix at discharge.  Patient is okay for discharge from vascular surgeon's  standpoint.      Discharge Exam:    Vitals:   02/14/20 1206 02/14/20 1933 02/15/20 0522 02/15/20 1321  BP: 138/70 113/64 (!) 151/61 (!) 155/70  Pulse: 69 74 74 78  Resp: 15 18 16 20   Temp: 98.7 F (37.1 C) 99.4 F (37.4 C) 98.5 F (36.9 C) 98.4 F (36.9 C)  TempSrc: Oral Oral Oral Oral  SpO2: 93% 94% 94% 96%  Weight:      Height:         GEN: NAD SKIN: No rash EYES: EOMI ENT: MMM CV: RRR PULM: CTA B ABD: soft, ND, NT, +BS CNS: AAO x 3, non focal EXT: Left above-knee amputee   The results of significant diagnostics from this hospitalization (including imaging, microbiology, ancillary and laboratory) are listed below for reference.     Procedures and Diagnostic Studies:   CT Angio Chest PE W and/or Wo Contrast  Result Date: 02/13/2020 CLINICAL DATA:  Chest pain for a few days. EXAM: CT ANGIOGRAPHY CHEST WITH CONTRAST TECHNIQUE: Multidetector CT imaging of the chest was performed using the standard protocol during bolus administration of intravenous contrast. Multiplanar CT image reconstructions and MIPs were obtained to evaluate the vascular anatomy. CONTRAST:  44m OMNIPAQUE IOHEXOL 350 MG/ML SOLN COMPARISON:  None. FINDINGS: Cardiovascular: Borderline heart size. No pericardial effusion. Extensive atherosclerotic plaque of the aorta, great vessels, and coronaries. Brachiocephalic stent is present. Good opacification of the pulmonary arteries with no filling defect when allowing for levels of overall mild artifact. Mediastinum/Nodes: Negative for adenopathy or pneumomediastinum. Heterogeneous thyroid without enlargement  or discrete nodule. Symmetrically inflated breast implants. Lungs/Pleura: Focal bandlike opacity in the left upper lobe that is stable from PET CT in 2019 and attributed to scarring. Streaky opacity at the lower lobes from atelectasis. There is no edema, consolidation, effusion, or pneumothorax. Upper Abdomen: Reported separately Musculoskeletal: No acute or  aggressive finding. Remote T3 compression fracture. Review of the MIP images confirms the above findings. IMPRESSION: 1. Negative for pulmonary embolism or other acute finding. 2. Atelectasis and left upper lobe scarring. 3.  Aortic Atherosclerosis (ICD10-I70.0).  Coronary atherosclerosis. Electronically Signed   By: Monte Fantasia M.D.   On: 02/13/2020 05:31   CT ABDOMEN PELVIS W CONTRAST  Result Date: 02/13/2020 CLINICAL DATA:  Unspecified abdominal pain EXAM: CT ABDOMEN AND PELVIS WITH CONTRAST TECHNIQUE: Multidetector CT imaging of the abdomen and pelvis was performed using the standard protocol following bolus administration of intravenous contrast. CONTRAST:  56m OMNIPAQUE IOHEXOL 350 MG/ML SOLN COMPARISON:  08/03/2009 CTA. FINDINGS: Lower chest:  Reported separately. Hepatobiliary: No focal liver abnormality.No evidence of biliary obstruction or stone. Pancreas: Unremarkable. Spleen: Unremarkable. Adrenals/Urinary Tract: Negative adrenals. No hydronephrosis or ureteral stone. 4 cm left renal cyst. There are other tiny cortical low densities. High-density foci at bilateral urinary collecting system could be early excretion of contrast given the preceding CTA. Small renal calculi are not excluded. Unremarkable bladder. Stomach/Bowel: Numerous colonic diverticula. No active inflammation. No bowel obstruction. Negative for appendicitis. Vascular/Lymphatic: Extensive atheromatous plaque, status post bilateral iliac and left renal artery stenting. Proximally, the right iliac stent is compressed by the kissing stent, also seen in 2012. No acute occlusion is seen. There is high-grade narrowing at the SMA and celiac ostia from atheromatous plaque. No mass or adenopathy. Reproductive:Hysterectomy. Other: No ascites or pneumoperitoneum. Musculoskeletal: Left lower extremity amputation with hip disarticulation. IMPRESSION: 1. No acute intra-abdominal finding. 2. Colonic diverticulosis. 3. Severe atherosclerosis  with high-grade narrowing at the celiac and SMA ostia. Electronically Signed   By: JMonte FantasiaM.D.   On: 02/13/2020 05:37   PERIPHERAL VASCULAR CATHETERIZATION  Result Date: 02/13/2020 See op note    Labs:   Basic Metabolic Panel: Recent Labs  Lab 02/12/20 1520 02/14/20 0024  NA 137 136  K 3.6 3.6  CL 98 101  CO2 26 25  GLUCOSE 97 152*  BUN 16 13  CREATININE 0.37* 0.37*  CALCIUM 9.4 8.5*   GFR Estimated Creatinine Clearance: 43 mL/min (A) (by C-G formula based on SCr of 0.37 mg/dL (L)). Liver Function Tests: Recent Labs  Lab 02/13/20 0440  AST 24  ALT 12  ALKPHOS 62  BILITOT 1.3*  PROT 7.4  ALBUMIN 4.1   Recent Labs  Lab 02/13/20 0440  LIPASE 27   No results for input(s): AMMONIA in the last 168 hours. Coagulation profile Recent Labs  Lab 02/12/20 1520  INR 0.8    CBC: Recent Labs  Lab 02/12/20 1520 02/14/20 0024 02/14/20 0742  WBC 9.1 7.1 8.7  HGB 15.3* 13.4 13.5  HCT 44.7 39.0 39.2  MCV 94.3 94.0 94.5  PLT 246 244 259   Cardiac Enzymes: No results for input(s): CKTOTAL, CKMB, CKMBINDEX, TROPONINI in the last 168 hours. BNP: Invalid input(s): POCBNP CBG: No results for input(s): GLUCAP in the last 168 hours. D-Dimer No results for input(s): DDIMER in the last 72 hours. Hgb A1c No results for input(s): HGBA1C in the last 72 hours. Lipid Profile No results for input(s): CHOL, HDL, LDLCALC, TRIG, CHOLHDL, LDLDIRECT in the last 72 hours. Thyroid function studies No  results for input(s): TSH, T4TOTAL, T3FREE, THYROIDAB in the last 72 hours.  Invalid input(s): FREET3 Anemia work up No results for input(s): VITAMINB12, FOLATE, FERRITIN, TIBC, IRON, RETICCTPCT in the last 72 hours. Microbiology Recent Results (from the past 240 hour(s))  SARS Coronavirus 2 by RT PCR (hospital order, performed in [facility] hospital lab) Nasopharyngeal Nasopharyngeal Swab     Status: None   Collection Time: 02/13/20  5:54 AM   Specimen: Nasopharyngeal  Swab  Result Value Ref Range Status   SARS Coronavirus 2 NEGATIVE NEGATIVE Final    Comment: (NOTE) SARS-CoV-2 target nucleic acids are NOT DETECTED.  The SARS-CoV-2 RNA is generally detectable in upper and lower respiratory specimens during the acute phase of infection. The lowest concentration of SARS-CoV-2 viral copies this assay can detect is 250 copies / mL. A negative result does not preclude SARS-CoV-2 infection and should not be used as the sole basis for treatment or other patient management decisions.  A negative result may occur with improper specimen collection / handling, submission of specimen other than nasopharyngeal swab, presence of viral mutation(s) within the areas targeted by this assay, and inadequate number of viral copies (<250 copies / mL). A negative result must be combined with clinical observations, patient history, and epidemiological information.  Fact Sheet for Patients:   StrictlyIdeas.no  Fact Sheet for Healthcare Providers: BankingDealers.co.za  This test is not yet approved or  cleared by the [place] FDA and has been authorized for detection and/or diagnosis of SARS-CoV-2 by FDA under an Emergency Use Authorization (EUA).  This EUA will remain in effect (meaning this test can be used) for the duration of the COVID-19 declaration under Section 564(b)(1) of the Act, 21 U.S.C. section 360bbb-3(b)(1), unless the authorization is terminated or revoked sooner.  Performed at [facility], [address], [place], [place] [address]      Discharge Instructions:   Discharge Instructions    Diet - low sodium heart healthy   Complete by: As directed    Increase activity slowly   Complete by: As directed      Allergies as of 02/15/2020      Reactions   Amoxicillin Anaphylaxis   Naproxen Hives, Shortness Of Breath, Nausea And Vomiting, Swelling   Penicillins Shortness Of Breath,  Swelling, Rash   Has patient had a PCN reaction causing immediate rash, facial/tongue/throat swelling, SOB or lightheadedness with hypotension: Yes Has patient had a PCN reaction causing severe rash involving mucus membranes or skin necrosis: Yes Has patient had a PCN reaction that required hospitalization: Yes Has patient had a PCN reaction occurring within the last 10 years: No  If all of the above answers are "NO", then may proceed with Cephalosporin use.   Codeine Nausea And Vomiting   Levofloxacin Nausea And Vomiting   Shellfish Allergy Hives, Diarrhea, Nausea And Vomiting, Swelling      Medication List    STOP taking these medications   acetaminophen 500 MG tablet Commonly known as: TYLENOL   B COMPLEX VITAMINS PO   CENTRUM SILVER PO   Cranberry 300 MG tablet   docusate sodium 100 MG capsule Commonly known as: COLACE   montelukast 10 MG tablet Commonly known as: SINGULAIR   OxyCODONE HCl (Abuse Deter) 5 MG Taba Commonly known as: OXAYDO     TAKE these medications   amLODipine 5 MG tablet Commonly known as: NORVASC Take 1 tablet (5 mg total) by mouth daily.   Asacol HD 800 MG Tbec Generic drug:  Mesalamine Take 1,600 mg by mouth 2 (two) times daily.   aspirin 81 MG EC tablet Take 1 tablet (81 mg total) by mouth daily. Swallow whole. Start taking on: February 16, 2020   Calcium-Vitamin D-Vitamin K 500-100-40 MG-UNT-MCG Diona Fanti 1 tablet by mouth 2 (two) times daily. VIACTIV, 500-100-40 (Oral Tablet Chewable)  1 tablet twice a day for 0 days  Quantity: 0.00;  Refills: 0   Ordered :13-Apr-2010  Celene Kras, MA, Anastasiya ;  Started 29-October-2008 Active Comments: DX: 733.00   clopidogrel 75 MG tablet Commonly known as: PLAVIX Take 1 tablet (75 mg total) by mouth daily.   ezetimibe 10 MG tablet Commonly known as: ZETIA Take 1 tablet (10 mg total) by mouth daily.   famotidine 20 MG tablet Commonly known as: PEPCID TAKE 1 TABLET BY MOUTH 2 TIMES DAILY    ferrous sulfate 325 (65 FE) MG EC tablet Take 325 mg by mouth daily. FERROUS SULFATE, 325 (65 Fe)MG (Oral Tablet Delayed Release)  1 every day for 0 days  Quantity: 0.00;  Refills: 0   Ordered :13-Apr-2010  Celene Kras, MA, Anastasiya ;  Started 29-October-2008 Active Comments: DX: 285.9   fexofenadine 180 MG tablet Commonly known as: ALLEGRA Take 180 mg by mouth at bedtime.   fluticasone 50 MCG/ACT nasal spray Commonly known as: FLONASE USE 2 SPRAYS EACH NOSTRIL DAILY   losartan 50 MG tablet Commonly known as: COZAAR Take 1 tablet (50 mg total) by mouth daily.   methimazole 5 MG tablet Commonly known as: TAPAZOLE Take 2.5 mg by mouth daily.   rosuvastatin 20 MG tablet Commonly known as: CRESTOR Take 0.5 tablets (10 mg total) by mouth daily.   vitamin C 1000 MG tablet Take 500 mg by mouth daily.       Follow-up Information    Schnier, Dolores Lory, MD Follow up in 1 month(s).   Specialties: Vascular Surgery, Cardiology, Radiology, Vascular Surgery Why: Can see Schnier or Arna Medici. Will need mesenteric with visit.  Contact information: [place] [address] [phone]                Time coordinating discharge: 32 minutes  Signed:  Jennye Boroughs  [facility] 02/15/2020, 2:23 PM   Pager on [email or web address]. If 7PM-7AM, please contact night-coverage at [email or web address]

## 2020-02-15 NOTE — Care Management Important Message (Signed)
Important Message  Patient Details  Name: Misty Page MRN: [id number] Date of Birth: 1940/07/14   Medicare Important Message Given:  Yes     Dannette Barbara 02/15/2020, 11:53 AM

## 2020-02-15 NOTE — Discharge Instructions (Signed)
Keep your groins clean and dry.

## 2020-02-27 ENCOUNTER — Other Ambulatory Visit: Payer: Self-pay

## 2020-02-27 ENCOUNTER — Encounter: Payer: Self-pay | Admitting: Dermatology

## 2020-02-27 ENCOUNTER — Ambulatory Visit (INDEPENDENT_AMBULATORY_CARE_PROVIDER_SITE_OTHER): Payer: PPO | Admitting: Dermatology

## 2020-02-27 DIAGNOSIS — D1800 Hemangioma unspecified site: Secondary | ICD-10-CM | POA: Diagnosis not present

## 2020-02-27 DIAGNOSIS — Z85831 Personal history of malignant neoplasm of soft tissue: Secondary | ICD-10-CM | POA: Diagnosis not present

## 2020-02-27 DIAGNOSIS — K130 Diseases of lips: Secondary | ICD-10-CM | POA: Diagnosis not present

## 2020-02-27 DIAGNOSIS — L814 Other melanin hyperpigmentation: Secondary | ICD-10-CM

## 2020-02-27 DIAGNOSIS — L821 Other seborrheic keratosis: Secondary | ICD-10-CM

## 2020-02-27 DIAGNOSIS — L578 Other skin changes due to chronic exposure to nonionizing radiation: Secondary | ICD-10-CM | POA: Diagnosis not present

## 2020-02-27 DIAGNOSIS — L820 Inflamed seborrheic keratosis: Secondary | ICD-10-CM

## 2020-02-27 NOTE — Progress Notes (Signed)
   New Patient Visit  Subjective  Misty Page is a 79 y.o. female who presents for the following: Other (Spots of right leg , face and back that get irritated) and Rash (History of fissures of lip - Healed now after starting Vaseline daily.). She had sarcoma and had amputation left leg.  She was in the hospital and had a lot of problems with rashes and lip problems.  Some that is now resolved now.  The following portions of the chart were reviewed this encounter and updated as appropriate:  Tobacco  Allergies  Meds  Problems  Med Hx  Surg Hx  Fam Hx     Review of Systems:  No other skin or systemic complaints except as noted in HPI or Assessment and Plan.  Objective  Well appearing patient in no apparent distress; mood and affect are within normal limits.  A focused examination was performed including face, right leg, back. Relevant physical exam findings are noted in the Assessment and Plan.  Objective  Mid Lower [place] Lip: Clear today.  Objective  Left Thigh - Anterior: Left leg amputation  Objective  Right lower leg x 1, left cheek x 1 (2): Erythematous keratotic or waxy stuck-on papule or plaque.    Assessment & Plan    Actinic Damage - diffuse scaly erythematous macules with underlying dyspigmentation - Recommend daily broad spectrum sunscreen SPF 30+ to sun-exposed areas, reapply every 2 hours as needed.  - Call for new or changing lesions.  Lentigines - Scattered tan macules - Discussed due to sun exposure - Benign, observe - Call for any changes  Hemangiomas - Red papules - Discussed benign nature - Observe - Call for any changes  Seborrheic Keratoses - Stuck-on, waxy, tan-brown papules and plaques  - Discussed benign etiology and prognosis. - Observe - Call for any changes   Cheilitis Mid Lower [place] Lip Resolved today. Continue Tacrolimus prn flares. Advised patient not to use clobetasol on her lips.  History of sarcoma Left  Thigh - Anterior Left leg amputation secondary to sarcoma  Inflamed seborrheic keratosis Right lower leg x 1, left cheek x 1  Destruction of lesion - Right lower leg x 1, left cheek x 1 Complexity: simple   Destruction method: cryotherapy   Informed consent: discussed and consent obtained   Timeout:  patient name, date of birth, surgical site, and procedure verified Lesion destroyed using liquid nitrogen: Yes   Region frozen until ice ball extended beyond lesion: Yes   Outcome: patient tolerated procedure well with no complications   Post-procedure details: wound care instructions given    Return if symptoms worsen or fail to improve.  I, Ashok Cordia, CMA, am acting as scribe for Sarina Ser, MD .  Documentation: I have reviewed the above documentation for accuracy and completeness, and I agree with the above.  Sarina Ser, MD

## 2020-02-27 NOTE — Patient Instructions (Signed)

## 2020-03-06 DIAGNOSIS — Z7409 Other reduced mobility: Secondary | ICD-10-CM | POA: Diagnosis not present

## 2020-03-06 DIAGNOSIS — Z89622 Acquired absence of left hip joint: Secondary | ICD-10-CM | POA: Diagnosis not present

## 2020-03-07 NOTE — Telephone Encounter (Signed)
This encounter was created in error - please disregard.

## 2020-03-12 ENCOUNTER — Other Ambulatory Visit (INDEPENDENT_AMBULATORY_CARE_PROVIDER_SITE_OTHER): Payer: Self-pay | Admitting: Vascular Surgery

## 2020-03-12 DIAGNOSIS — K551 Chronic vascular disorders of intestine: Secondary | ICD-10-CM

## 2020-03-12 DIAGNOSIS — Z95820 Peripheral vascular angioplasty status with implants and grafts: Secondary | ICD-10-CM

## 2020-03-13 ENCOUNTER — Ambulatory Visit (INDEPENDENT_AMBULATORY_CARE_PROVIDER_SITE_OTHER): Payer: PPO

## 2020-03-13 ENCOUNTER — Other Ambulatory Visit: Payer: Self-pay

## 2020-03-13 ENCOUNTER — Ambulatory Visit (INDEPENDENT_AMBULATORY_CARE_PROVIDER_SITE_OTHER): Payer: PPO | Admitting: Nurse Practitioner

## 2020-03-13 ENCOUNTER — Encounter (INDEPENDENT_AMBULATORY_CARE_PROVIDER_SITE_OTHER): Payer: Self-pay | Admitting: Nurse Practitioner

## 2020-03-13 VITALS — BP 169/95 | HR 76 | Ht 61.0 in | Wt 119.0 lb

## 2020-03-13 DIAGNOSIS — K551 Chronic vascular disorders of intestine: Secondary | ICD-10-CM

## 2020-03-13 DIAGNOSIS — I10 Essential (primary) hypertension: Secondary | ICD-10-CM

## 2020-03-13 DIAGNOSIS — Z95820 Peripheral vascular angioplasty status with implants and grafts: Secondary | ICD-10-CM | POA: Diagnosis not present

## 2020-03-13 DIAGNOSIS — E7800 Pure hypercholesterolemia, unspecified: Secondary | ICD-10-CM

## 2020-03-13 DIAGNOSIS — I6523 Occlusion and stenosis of bilateral carotid arteries: Secondary | ICD-10-CM | POA: Diagnosis not present

## 2020-03-19 ENCOUNTER — Encounter (INDEPENDENT_AMBULATORY_CARE_PROVIDER_SITE_OTHER): Payer: Self-pay | Admitting: Nurse Practitioner

## 2020-03-19 NOTE — Progress Notes (Signed)
Subjective:    Patient ID: Misty Page, female    DOB: 10-Dec-1940, 79 y.o.   MRN: [id number] Chief Complaint  Patient presents with  . Follow-up    U/S follow up    Patient presents today after intervention on 02/13/2020 for acute on chronic mesenteric ischemia.  The patient had severe abdominal pain which is subsequently found to have stenosis of the superior mesenteric artery as well as celiac artery.  The patient also has a known history of colitis.  The patient notes that since she has had her intervention done her abdominal pain has essentially ceased.  She denies any nausea or vomiting.  She denies any food phobias.  She denies any fever, chills, nausea, vomiting or diarrhea.  There was also a stent placed within the SMA.  Noninvasive studies today show a largest aortic diameter of 2.5 cm.  The stent placed in the superior mesenteric artery is patent.  There does appear to be a 70 to 99% stenosis within the celiac and mesenteric artery.  There is no previous mesenteric ultrasound for comparison.   Review of Systems  Gastrointestinal: Negative for abdominal pain.  Neurological: Positive for weakness.  All other systems reviewed and are negative.      Objective:   Physical Exam Cardiovascular:     Rate and Rhythm: Normal rate.     Pulses: Normal pulses.  Pulmonary:     Effort: Pulmonary effort is normal.  Musculoskeletal:     Left Lower Extremity: (left hip disarticulation) Neurological:     Mental Status: She is oriented to person, place, and time.     Motor: Weakness present.     Gait: Gait abnormal.  Psychiatric:        Mood and Affect: Mood normal.        Behavior: Behavior normal.        Thought Content: Thought content normal.        Judgment: Judgment normal.     BP (!) 169/95   Pulse 76   Ht 5' 1"  (1.549 m)   Wt 119 lb (54 kg)   BMI 22.48 kg/m   Past Medical History:  Diagnosis Date  . Allergy   . Anemia   . Arthritis   . CA of skin 10/15/2014  .  Calculus of kidney 10/15/2014   Seen in ER 06/16/10.   . Colitis   . Complication of anesthesia    nausea  . Complication of internal prosthetic device 01/27/2009  . Coronary artery disease   . Diverticulitis   . Environmental and seasonal allergies   . Fibrocystic breast disease (FCBD)   . GERD (gastroesophageal reflux disease)   . Heart murmur   . High cholesterol   . History of heart artery stent   . Hyperlipidemia   . Hypertension   . Hyperthyroidism   . Myocardial infarction ([place])   . Osteomyelitis of left femur ([place]) 11/30/2018  . Osteoporosis   . Thyroid disease   . TIA (transient ischemic attack)     Social History   Socioeconomic History  . Marital status: Widowed    Spouse name: Konrad Dolores  . Number of children: 1  . Years of education: Not on file  . Highest education level: 12th grade  Occupational History  . Occupation: Emergency planning/management officer    Comment: retired  Tobacco Use  . Smoking status: Former Smoker    Quit date: 05/30/1994    Years since quitting: 25.8  . Smokeless tobacco: Never Used  Vaping Use  . Vaping Use: Never used  Substance and Sexual Activity  . Alcohol use: Not Currently    Comment: rare- 1 glass of wine EOW  . Drug use: No  . Sexual activity: Not Currently    Birth control/protection: None  Other Topics Concern  . Not on file  Social History Narrative   Lives alone; gets assistance 3x/week-hired help   Social Determinants of Health   Financial Resource Strain: Low Risk   . Difficulty of Paying Living Expenses: Not hard at all  Food Insecurity: No Food Insecurity  . Worried About Charity fundraiser in the Last Year: Never true  . Ran Out of Food in the Last Year: Never true  Transportation Needs: No Transportation Needs  . Lack of Transportation (Medical): No  . Lack of Transportation (Non-Medical): No  Physical Activity: Inactive  . Days of Exercise per Week: 0 days  . Minutes of Exercise per Session: 0 min  Stress: No Stress Concern  Present  . Feeling of Stress : Not at all  Social Connections: Socially Isolated  . Frequency of Communication with Friends and Family: More than three times a week  . Frequency of Social Gatherings with Friends and Family: Three times a week  . Attends Religious Services: Never  . Active Member of Clubs or Organizations: No  . Attends Archivist Meetings: Never  . Marital Status: Widowed  Intimate Partner Violence: Not At Risk  . Fear of Current or Ex-Partner: No  . Emotionally Abused: No  . Physically Abused: No  . Sexually Abused: No    Past Surgical History:  Procedure Laterality Date  . ABDOMINAL HYSTERECTOMY  2007  . BREAST ENHANCEMENT SURGERY  2004  . BREAST IMPLANT REMOVAL    . BREAST SURGERY  1984   Fibrocystic Disease  . CAROTID ENDARTERECTOMY Left 06/21/2014   Dr. Delana Meyer  . CAROTID ENDARTERECTOMY Right 08/02/2014   Dr. Delana Meyer  . CAROTID PTA/STENT INTERVENTION N/A 06/15/2017   Procedure: CAROTID PTA/STENT INTERVENTION;  Surgeon: Katha Cabal, MD;  Location: [facility];  Service: Cardiovascular;  Laterality: N/A;  . COLONOSCOPY WITH PROPOFOL N/A 07/14/2015   Procedure: COLONOSCOPY WITH PROPOFOL;  Surgeon: Hulen Luster, MD;  Location: [facility];  Service: Gastroenterology;  Laterality: N/A;  . COLONOSCOPY WITH PROPOFOL N/A 02/08/2018   Procedure: COLONOSCOPY WITH PROPOFOL;  Surgeon: Toledo, Benay Pike, MD;  Location: [facility] ENDOSCOPY;  Service: Gastroenterology;  Laterality: N/A;  . CORONARY ANGIOPLASTY WITH STENT PLACEMENT  1999  . ESOPHAGOGASTRODUODENOSCOPY (EGD) WITH PROPOFOL N/A 02/08/2018   Procedure: ESOPHAGOGASTRODUODENOSCOPY (EGD) WITH PROPOFOL;  Surgeon: Toledo, Benay Pike, MD;  Location: [facility] ENDOSCOPY;  Service: Gastroenterology;  Laterality: N/A;  . FEMORAL ARTERY STENT  08/2003  . HERNIA REPAIR  2008  . LEG AMPUTATION AT HIP Left 02/28/2019  . MASTECTOMY    . RENAL ARTERY STENT  08/2003  . TRANSUMBILICAL AUGMENTATION MAMMAPLASTY      . VASCULAR SURGERY    . VISCERAL ARTERY INTERVENTION N/A 02/13/2020   Procedure: VISCERAL ARTERY INTERVENTION;  Surgeon: Algernon Huxley, MD;  Location: [facility];  Service: Cardiovascular;  Laterality: N/A;    Family History  Problem Relation Age of Onset  . Hyperlipidemia Mother   . Congestive Heart Failure Mother   . COPD Mother   . Heart disease Mother   . Hypertension Mother   . Osteoporosis Mother   . Pancreatic cancer Father   . Arthritis Sister   . Alzheimer's  disease Sister   . Hypertension Sister   . Prostate cancer Brother   . Heart attack Brother   . Stomach cancer Brother   . Kidney failure Brother   . Leukemia Brother   . Emphysema Brother   . Pancreatic cancer Brother   . Stomach cancer Brother   . Breast cancer Paternal Grandmother   . Leukemia Paternal Grandfather   . Heart attack Maternal Uncle   . Stroke Maternal Aunt     Allergies  Allergen Reactions  . Amoxicillin Anaphylaxis  . Naproxen Hives, Shortness Of Breath, Nausea And Vomiting and Swelling  . Penicillins Shortness Of Breath, Swelling and Rash    Has patient had a PCN reaction causing immediate rash, facial/tongue/throat swelling, SOB or lightheadedness with hypotension: Yes Has patient had a PCN reaction causing severe rash involving mucus membranes or skin necrosis: Yes Has patient had a PCN reaction that required hospitalization: Yes Has patient had a PCN reaction occurring within the last 10 years: No  If all of the above answers are "NO", then may proceed with Cephalosporin use.   . Codeine Nausea And Vomiting  . Levofloxacin Nausea And Vomiting  . Shellfish Allergy Hives, Diarrhea, Nausea And Vomiting and Swelling       Assessment & Plan:   1. Superior mesenteric artery stenosis (HCC) Recommend:  The patient is status post successful angiogram with intervention of the mesenteric vessels.   The patient reports that the abdominal pain is improved and the post prandial  symptoms are essentially gone.   The patient denies lifestyle limiting changes at this point in time.  No further invasive studies, angiography or surgery at this time .  The patient should continue antiplatelet therapy and aggressive treatment of the lipid abnormalities    Patient should undergo noninvasive studies as ordered. The patient will follow up with me after the studies.    2. Essential (primary) hypertension Continue antihypertensive medications as already ordered, these medications have been reviewed and there are no changes at this time.   3. Bilateral carotid artery stenosis The patient has an upcoming study scheduled for her routine follow-up bilateral carotid artery stenosis.  No worsening signs symptoms of carotid stenosis with focal neurological issues.  The patient also has evidence of subclavian steal and currently there is no worsening symptoms there as well.  4. Hypercholesteremia Continue statin as ordered and reviewed, no changes at this time    Current Outpatient Medications on File Prior to Visit  Medication Sig Dispense Refill  . amLODipine (NORVASC) 5 MG tablet Take 1 tablet (5 mg total) by mouth daily. 90 tablet 3  . Ascorbic Acid (VITAMIN C) 1000 MG tablet Take 500 mg by mouth daily.     Marland Kitchen aspirin EC 81 MG EC tablet Take 1 tablet (81 mg total) by mouth daily. Swallow whole.    . Calcium-Vitamin D-Vitamin K 500-100-40 MG-UNT-MCG CHEW Chew 1 tablet by mouth 2 (two) times daily. VIACTIV, 500-100-40 (Oral Tablet Chewable)  1 tablet twice a day for 0 days  Quantity: 0.00;  Refills: 0   Ordered :13-Apr-2010  Celene Kras, MA, Anastasiya ;  Started 29-October-2008 Active Comments: DX: 733.00    . clopidogrel (PLAVIX) 75 MG tablet Take 1 tablet (75 mg total) by mouth daily. 90 tablet 3  . ezetimibe (ZETIA) 10 MG tablet Take 1 tablet (10 mg total) by mouth daily. 90 tablet 3  . famotidine (PEPCID) 20 MG tablet TAKE 1 TABLET BY MOUTH 2 TIMES DAILY (Patient taking  differently:  Take 20 mg by mouth 2 (two) times daily. ) 180 tablet 0  . ferrous sulfate 325 (65 FE) MG EC tablet Take 325 mg by mouth daily. FERROUS SULFATE, 325 (65 Fe)MG (Oral Tablet Delayed Release)  1 every day for 0 days  Quantity: 0.00;  Refills: 0   Ordered :13-Apr-2010  Celene Kras, MA, Anastasiya ;  Started 29-October-2008 Active Comments: DX: 285.9    . fexofenadine (ALLEGRA) 180 MG tablet Take 180 mg by mouth at bedtime.     . fluticasone (FLONASE) 50 MCG/ACT nasal spray USE 2 SPRAYS EACH NOSTRIL DAILY 48 g 3  . losartan (COZAAR) 50 MG tablet Take 1 tablet (50 mg total) by mouth daily. 90 tablet 3  . Mesalamine (ASACOL HD) 800 MG TBEC Take 1,600 mg by mouth 2 (two) times daily.     . methimazole (TAPAZOLE) 5 MG tablet Take 2.5 mg by mouth daily.     . rosuvastatin (CRESTOR) 20 MG tablet Take 0.5 tablets (10 mg total) by mouth daily. 45 tablet 3   No current facility-administered medications on file prior to visit.    There are no Patient Instructions on file for this visit. No follow-ups on file.   Kris Hartmann, NP

## 2020-04-03 ENCOUNTER — Telehealth: Payer: Self-pay | Admitting: Family Medicine

## 2020-04-03 DIAGNOSIS — E7800 Pure hypercholesterolemia, unspecified: Secondary | ICD-10-CM

## 2020-04-03 NOTE — Telephone Encounter (Signed)
Misty Page with [facility] works on medication adherence.  She sees that pt takes a rosuvastatin (CRESTOR) 20 MG tablet that she splits in half.  Would like to know if you can consider prescribing a 10 mg rosuvastatin /1 day/ 90 tabs for a 3 month supply.  cb  [phone]

## 2020-04-03 NOTE — Telephone Encounter (Signed)
Please review. Thanks!  

## 2020-04-03 NOTE — Telephone Encounter (Signed)
Yes. Ok to change order as requested

## 2020-04-04 MED ORDER — ROSUVASTATIN CALCIUM 10 MG PO TABS: 10.0000 mg | ORAL_TABLET | Freq: Every day | ORAL | 1 refills | Status: DC

## 2020-04-04 NOTE — Telephone Encounter (Signed)
Left message for Misty Page to call back, letting us know if we need to send new prescription and to what pharmacy.

## 2020-04-04 NOTE — Telephone Encounter (Signed)
Monica returned the office call. She would like to have a new Rx sent to   [facility], [place] Phone:  [phone]  Fax:  [phone]

## 2020-04-04 NOTE — Telephone Encounter (Signed)
Medication sent to [facility].

## 2020-04-06 DIAGNOSIS — Z89622 Acquired absence of left hip joint: Secondary | ICD-10-CM | POA: Diagnosis not present

## 2020-04-06 DIAGNOSIS — Z7409 Other reduced mobility: Secondary | ICD-10-CM | POA: Diagnosis not present

## 2020-04-10 ENCOUNTER — Other Ambulatory Visit: Payer: Self-pay | Admitting: Physician Assistant

## 2020-04-18 DIAGNOSIS — M75111 Incomplete rotator cuff tear or rupture of right shoulder, not specified as traumatic: Secondary | ICD-10-CM | POA: Diagnosis not present

## 2020-04-18 DIAGNOSIS — M7581 Other shoulder lesions, right shoulder: Secondary | ICD-10-CM | POA: Diagnosis not present

## 2020-04-19 IMAGING — US US EXTREM LOW VENOUS*L*
1 series · 14 of 24 positions shown · non-contrast
Comparison: None.

CLINICAL DATA: Left leg swelling for 1 week

EXAM:
LEFT LOWER EXTREMITY VENOUS DUPLEX ULTRASOUND
TECHNIQUE: Doppler venous assessment of the left lower extremity deep venous
system was performed, including characterization of spectral flow,
compressibility, and phasicity.

[Series 1: us extrem low venous*left* · 0.08mm/px · 14 of 34 slices shown]
[im 1/34]
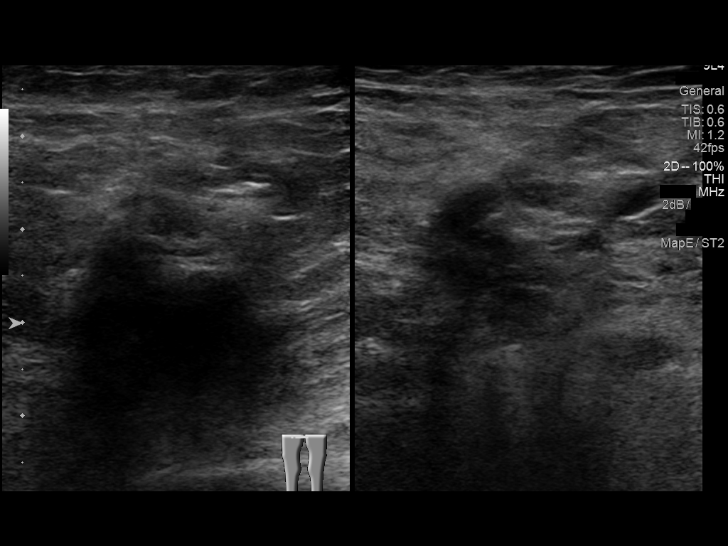
[im 3/34]
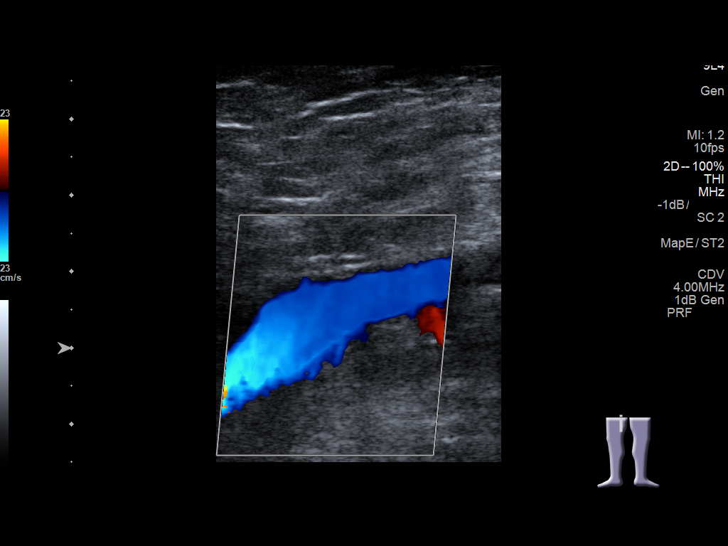
[im 6/34]
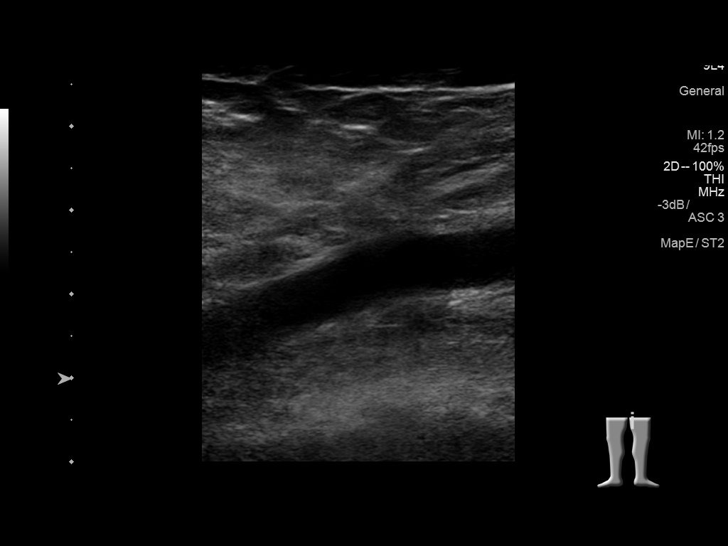
[im 9/34]
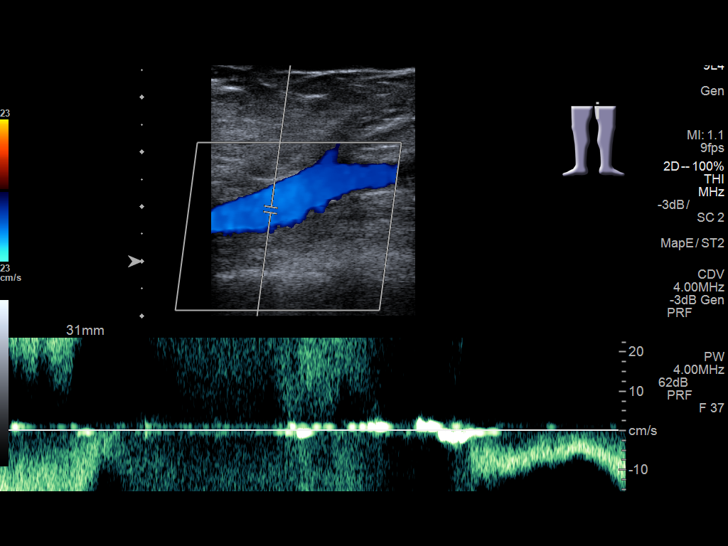
[im 11/34]
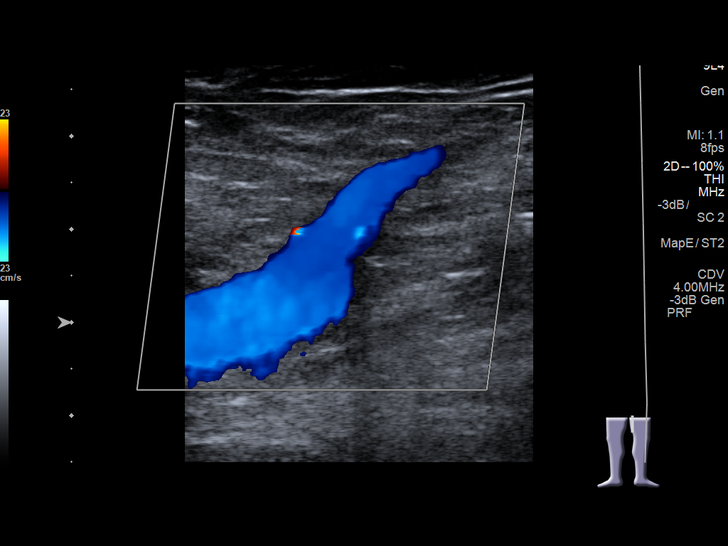
[im 13/34]
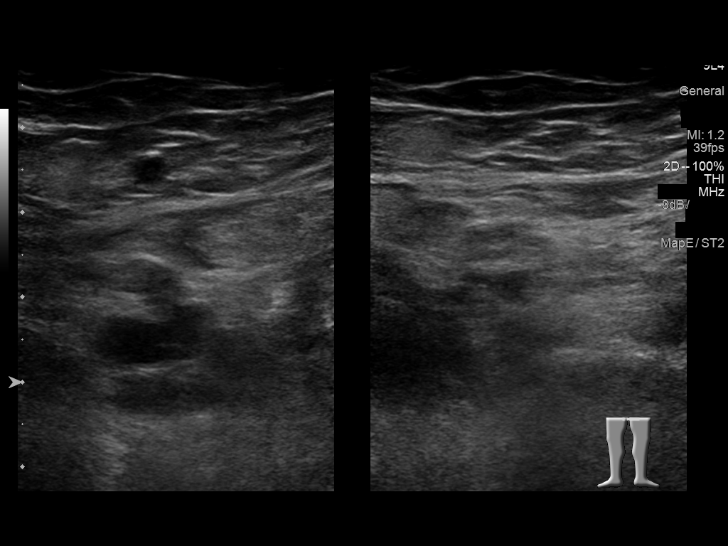
[im 16/34]
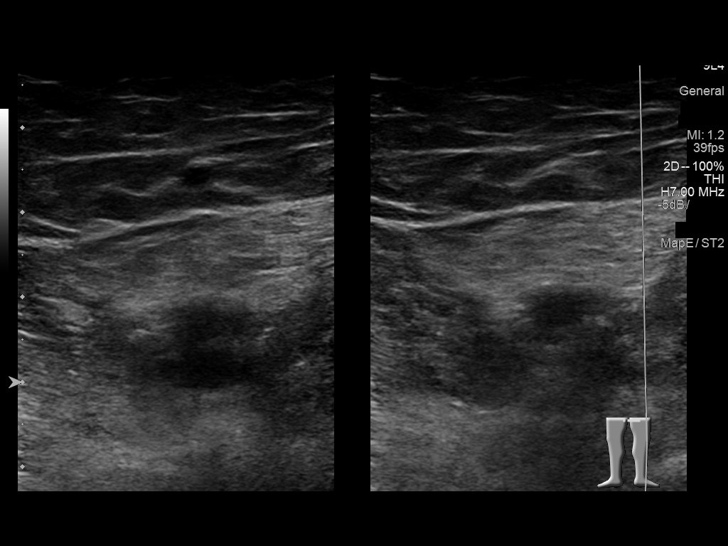
[im 18/34]
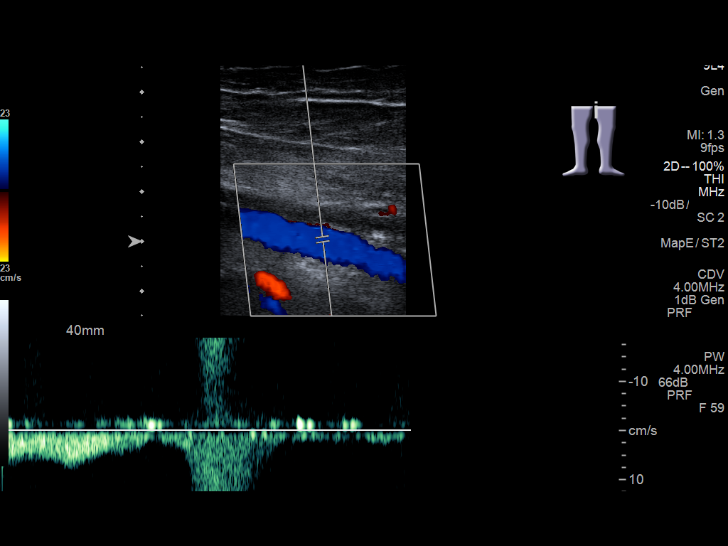
[im 21/34]
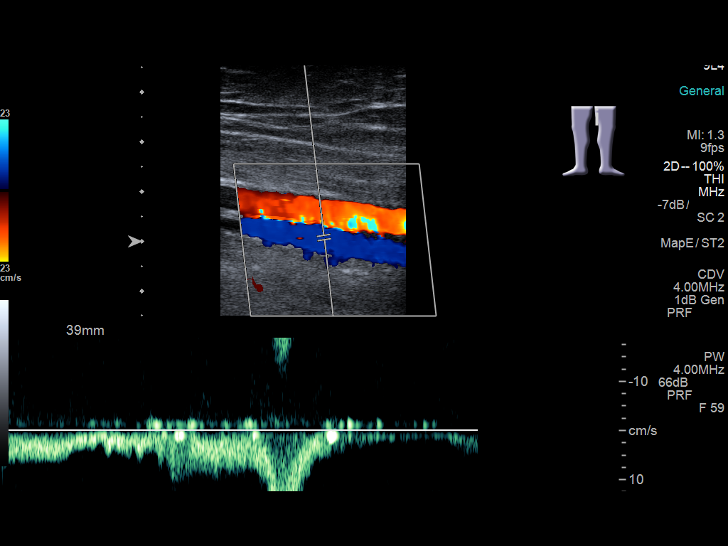
[im 23/34]
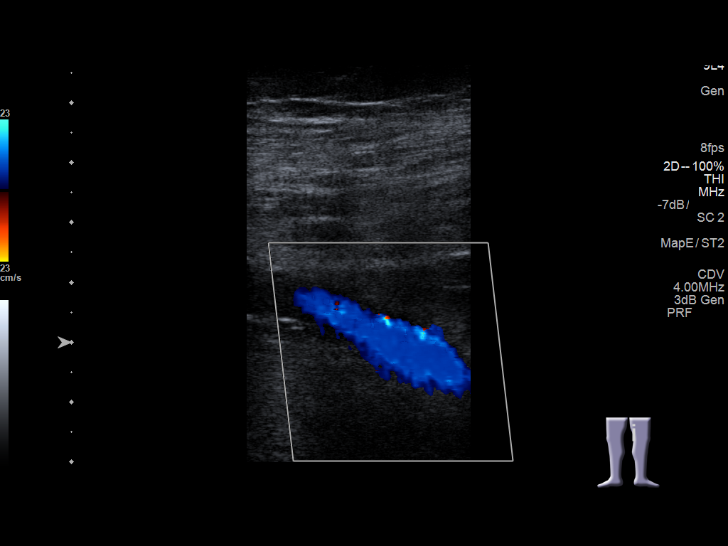
[im 26/34]
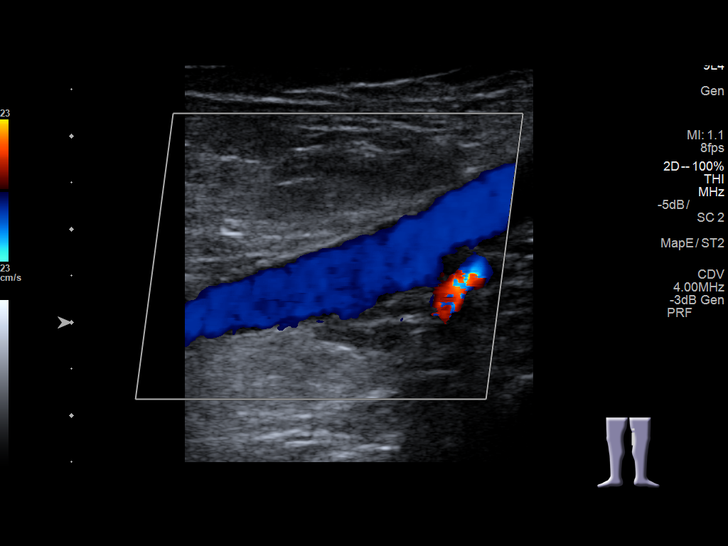
[im 28/34]
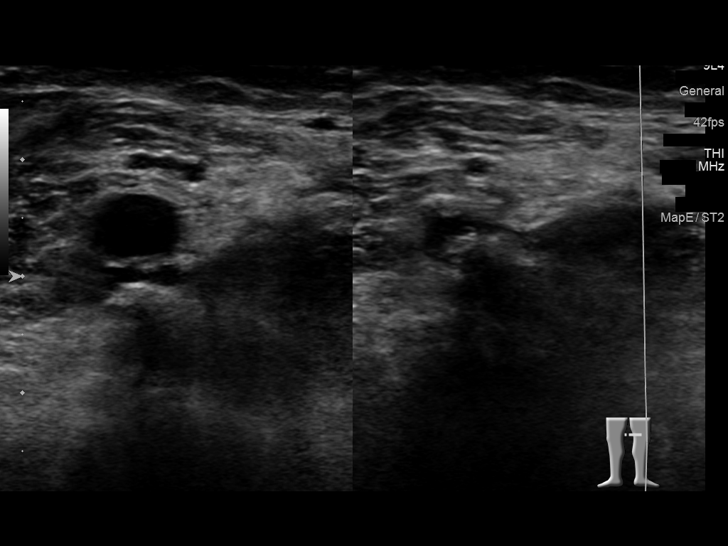
[im 31/34]
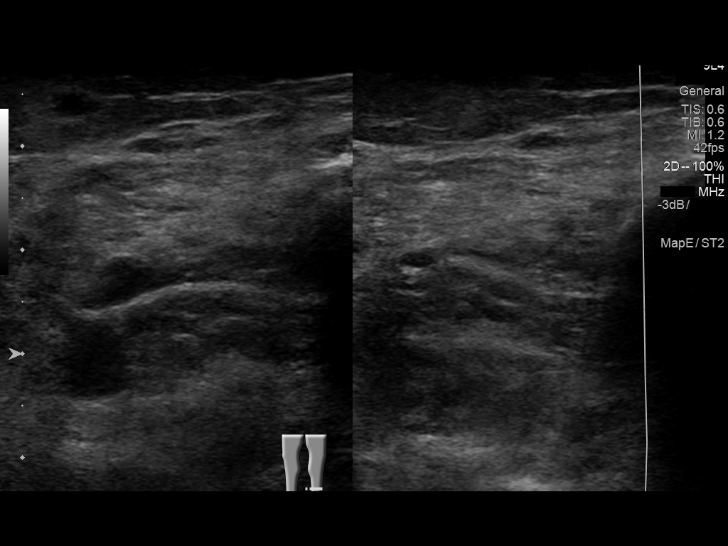
[im 34/34]
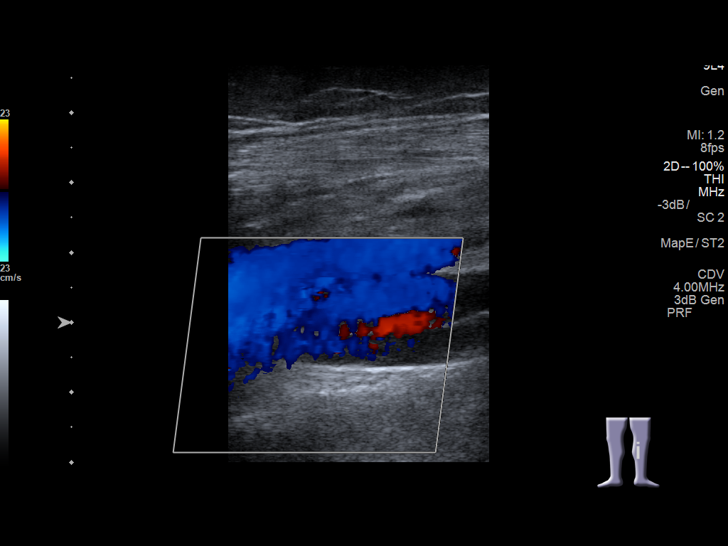

[14 of 24 positions shown; findings below may reference images not displayed]

FINDINGS: There is complete compressibility of the left common femoral,
femoral, and popliteal veins. Doppler analysis demonstrates
respiratory phasicity and augmentation of flow with calf
compression. No obvious superficial vein or calf vein thrombosis.
IMPRESSION: No evidence of left lower extremity DVT.

## 2020-04-28 ENCOUNTER — Encounter: Payer: Self-pay | Admitting: Family Medicine

## 2020-04-28 ENCOUNTER — Ambulatory Visit (INDEPENDENT_AMBULATORY_CARE_PROVIDER_SITE_OTHER): Payer: PPO | Admitting: Family Medicine

## 2020-04-28 ENCOUNTER — Other Ambulatory Visit: Payer: Self-pay

## 2020-04-28 VITALS — BP 138/68 | HR 77 | Temp 98.8°F | Resp 16 | Wt 125.0 lb

## 2020-04-28 DIAGNOSIS — Z89612 Acquired absence of left leg above knee: Secondary | ICD-10-CM | POA: Diagnosis not present

## 2020-04-28 DIAGNOSIS — I10 Essential (primary) hypertension: Secondary | ICD-10-CM | POA: Diagnosis not present

## 2020-04-28 DIAGNOSIS — I739 Peripheral vascular disease, unspecified: Secondary | ICD-10-CM | POA: Diagnosis not present

## 2020-04-28 DIAGNOSIS — E7800 Pure hypercholesterolemia, unspecified: Secondary | ICD-10-CM

## 2020-04-28 NOTE — Assessment & Plan Note (Signed)
Followed by [facility] Continue DAPT and statin

## 2020-04-28 NOTE — Assessment & Plan Note (Signed)
Well controlled Continue current medications Recheck metabolic panel F/u in 6 months  

## 2020-04-28 NOTE — Assessment & Plan Note (Signed)
Previously slightly above goal Continue statin and zetia Repeat FLP and CMP Goal LDL < 70

## 2020-04-28 NOTE — Assessment & Plan Note (Signed)
Followed by [facility] Stable S/p L hip disarticulation

## 2020-04-28 NOTE — Progress Notes (Signed)
Established patient visit   Patient: Misty Page   DOB: 03-17-1941   79 y.o. Female  MRN: [id number] Visit Date: 04/28/2020  Today's healthcare provider: Lavon Paganini, MD   Chief Complaint  Patient presents with  . Hypertension   Subjective    HPI  Hypertension, follow-up  BP Readings from Last 3 Encounters:  04/28/20 138/68  03/13/20 (!) 169/95  02/15/20 (!) 155/70   Wt Readings from Last 3 Encounters:  04/28/20 125 lb (56.7 kg)  03/13/20 119 lb (54 kg)  02/13/20 119 lb (54 kg)     She was last seen for hypertension 6 months ago.  BP at that visit was 90/57. Management since that visit includes stop Losartan. Patient reports she is taking medication again.  She reports excellent compliance with treatment. She is not having side effects.  She is following a Regular diet. She is not exercising. She does not smoke.  Use of agents associated with hypertension: none.   Outside blood pressures are not being checked. Symptoms: No chest pain No chest pressure  No palpitations No syncope  No dyspnea No orthopnea  No paroxysmal nocturnal dyspnea No lower extremity edema   Pertinent labs: Lab Results  Component Value Date   CHOL 206 (H) 10/22/2019   HDL 77 10/22/2019   LDLCALC 108 (H) 10/22/2019   TRIG 121 10/22/2019   CHOLHDL 3.4 05/09/2019   Lab Results  Component Value Date   NA 136 02/14/2020   K 3.6 02/14/2020   CREATININE 0.37 (L) 02/14/2020   GFRNONAA >60 02/14/2020   GFRAA >60 02/14/2020   GLUCOSE 152 (H) 02/14/2020     The ASCVD Risk score (Goff DC Jr., et al., 2013) failed to calculate for the following reasons:   The patient has a prior MI or stroke diagnosis   ---------------------------------------------------------------------------------------------------   Patient Active Problem List   Diagnosis Date Noted  . Mesenteric ischemia ([place]) 02/13/2020  . Status post above-knee amputation of left lower extremity ([place]) 05/10/2019  .  Memory changes 05/10/2019  . History of sarcoma 05/08/2018  . 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 History of TIA (transient ischemic attack) 04/18/2017  . Bilateral carotid artery stenosis 02/07/2017  . Allergic rhinitis, seasonal 10/15/2014  . Cardiac murmur 10/15/2014  . History of colon polyps 10/15/2014  . Hyperthyroidism 10/15/2014  . Cannot sleep 10/15/2014  . Herpes zona 10/15/2014  . Ulcerative colitis ([place]) 10/15/2014  . Aortic heart valve narrowing 12/03/2013  . Arteriosclerosis of coronary artery 11/23/2013  . History of MI (myocardial infarction) 11/23/2013  . Peripheral vascular disease ([place]) 11/23/2013  . Diverticulitis of colon 07/21/2009  . Hemorrhoids without complication 29/93/7169  . Absolute anemia 10/29/2008  . Atherosclerosis of coronary artery 10/29/2008  . Essential (primary) hypertension 10/29/2008  . Acid reflux 10/29/2008  . Hypercholesteremia 10/29/2008  . OP (osteoporosis) 10/29/2008   Social History   Tobacco Use  . Smoking status: Former Smoker    Quit date: 05/30/1994    Years since quitting: 25.9  . Smokeless tobacco: Never Used  Vaping Use  . Vaping Use: Never used  Substance Use Topics  . Alcohol use: Not Currently    Comment: rare- 1 glass of wine EOW  . Drug use: No   Allergies  Allergen Reactions  . Amoxicillin Anaphylaxis  . Naproxen Hives, Shortness Of Breath, Nausea And Vomiting and Swelling  . Penicillins Shortness Of Breath, Swelling and Rash    Has patient had a PCN reaction causing immediate  rash, facial/tongue/throat swelling, SOB or lightheadedness with hypotension: Yes Has patient had a PCN reaction causing severe rash involving mucus membranes or skin necrosis: Yes Has patient had a PCN reaction that required hospitalization: Yes Has patient had a PCN reaction occurring within the last 10 years: No  If all of the above answers are "NO", then may proceed with Cephalosporin use.   . Codeine Nausea And Vomiting   . Levofloxacin Nausea And Vomiting  . Shellfish Allergy Hives, Diarrhea, Nausea And Vomiting and Swelling       Medications: Outpatient Medications Prior to Visit  Medication Sig  . amLODipine (NORVASC) 5 MG tablet Take 1 tablet (5 mg total) by mouth daily.  . Ascorbic Acid (VITAMIN C) 1000 MG tablet Take 500 mg by mouth daily.   Marland Kitchen aspirin EC 81 MG EC tablet Take 1 tablet (81 mg total) by mouth daily. Swallow whole.  . Calcium-Vitamin D-Vitamin K 500-100-40 MG-UNT-MCG CHEW Chew 1 tablet by mouth 2 (two) times daily. VIACTIV, 500-100-40 (Oral Tablet Chewable)  1 tablet twice a day for 0 days  Quantity: 0.00;  Refills: 0   Ordered :13-Apr-2010  Celene Kras, MA, Anastasiya ;  Started 29-October-2008 Active Comments: DX: 733.00  . clopidogrel (PLAVIX) 75 MG tablet Take 1 tablet (75 mg total) by mouth daily.  Marland Kitchen ezetimibe (ZETIA) 10 MG tablet Take 1 tablet (10 mg total) by mouth daily.  . famotidine (PEPCID) 20 MG tablet TAKE 1 TABLET BY MOUTH 2 TIMES DAILY  . ferrous sulfate 325 (65 FE) MG EC tablet Take 325 mg by mouth daily. FERROUS SULFATE, 325 (65 Fe)MG (Oral Tablet Delayed Release)  1 every day for 0 days  Quantity: 0.00;  Refills: 0   Ordered :13-Apr-2010  Celene Kras, MA, Anastasiya ;  Started 29-October-2008 Active Comments: DX: 285.9  . fexofenadine (ALLEGRA) 180 MG tablet Take 180 mg by mouth at bedtime.   . fluticasone (FLONASE) 50 MCG/ACT nasal spray USE 2 SPRAYS EACH NOSTRIL DAILY  . losartan (COZAAR) 50 MG tablet Take 1 tablet (50 mg total) by mouth daily.  . Mesalamine (ASACOL HD) 800 MG TBEC Take 1,600 mg by mouth 2 (two) times daily.   . methimazole (TAPAZOLE) 5 MG tablet Take 2.5 mg by mouth daily.   . rosuvastatin (CRESTOR) 10 MG tablet Take 1 tablet (10 mg total) by mouth daily.   No facility-administered medications prior to visit.    Review of Systems  Constitutional: Negative.   Respiratory: Negative.   Cardiovascular: Negative.       Objective    BP 138/68 (BP  Location: Left Arm, Patient Position: Sitting, Cuff Size: Normal)   Pulse 77   Temp 98.8 F (37.1 C) (Oral)   Resp 16   Wt 125 lb (56.7 kg)   SpO2 96%   BMI 23.62 kg/m  BP Readings from Last 3 Encounters:  04/28/20 138/68  03/13/20 (!) 169/95  02/15/20 (!) 155/70   Wt Readings from Last 3 Encounters:  04/28/20 125 lb (56.7 kg)  03/13/20 119 lb (54 kg)  02/13/20 119 lb (54 kg)      Physical Exam Vitals reviewed.  Constitutional:      General: She is not in acute distress.    Appearance: Normal appearance. She is well-developed. She is not diaphoretic.  HENT:     Head: Normocephalic and atraumatic.  Eyes:     General: No scleral icterus.    Conjunctiva/sclera: Conjunctivae normal.  Neck:     Thyroid: No thyromegaly.  Cardiovascular:  Rate and Rhythm: Normal rate and regular rhythm.     Pulses: Normal pulses.     Heart sounds: Normal heart sounds. No murmur heard.   Pulmonary:     Effort: Pulmonary effort is normal. No respiratory distress.     Breath sounds: Normal breath sounds. No wheezing, rhonchi or rales.  Musculoskeletal:     Cervical back: Neck supple.     Right lower leg: No edema.     Comments: S/p L AKA  Lymphadenopathy:     Cervical: No cervical adenopathy.  Skin:    General: Skin is warm and dry.     Findings: No rash.  Neurological:     Mental Status: She is alert and oriented to person, place, and time. Mental status is at baseline.  Psychiatric:        Mood and Affect: Mood normal.        Behavior: Behavior normal.       No results found for any visits on 04/28/20.  Assessment & Plan     Problem List Items Addressed This Visit      Cardiovascular and Mediastinum   Essential (primary) hypertension - Primary    Well controlled Continue current medications Recheck metabolic panel F/u in 6 months       Relevant Orders   Lipid panel   Comprehensive metabolic panel   Peripheral vascular disease ([place])    Followed by [facility] Continue  DAPT and statin        Other   Hypercholesteremia    Previously slightly above goal Continue statin and zetia Repeat FLP and CMP Goal LDL < 70      Relevant Orders   Lipid panel   Comprehensive metabolic panel   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)    Followed by [facility] Stable S/p L hip disarticulation           Return in about 6 months (around 10/26/2020) for CPE, AWV.      I, Lavon Paganini, MD, have reviewed all documentation for this visit. The documentation on 04/28/20 for the exam, diagnosis, procedures, and orders are all accurate and complete.   Hughie Melroy, Dionne Bucy, MD, MPH [facility]

## 2020-04-29 ENCOUNTER — Telehealth: Payer: Self-pay

## 2020-04-29 LAB — COMPREHENSIVE METABOLIC PANEL
ALT: 14 IU/L (ref 0–32)
AST: 14 IU/L (ref 0–40)
Albumin/Globulin Ratio: 2.1 (ref 1.2–2.2)
Albumin: 4.1 g/dL (ref 3.7–4.7)
Alkaline Phosphatase: 68 IU/L (ref 44–121)
BUN/Creatinine Ratio: 33 — ABNORMAL HIGH (ref 12–28)
BUN: 16 mg/dL (ref 8–27)
Bilirubin Total: 0.3 mg/dL (ref 0.0–1.2)
CO2: 28 mmol/L (ref 20–29)
Calcium: 9.7 mg/dL (ref 8.7–10.3)
Chloride: 99 mmol/L (ref 96–106)
Creatinine, Ser: 0.49 mg/dL — ABNORMAL LOW (ref 0.57–1.00)
GFR calc Af Amer: 107 mL/min/{1.73_m2} (ref >59)
GFR calc non Af Amer: 93 mL/min/{1.73_m2} (ref >59)
Globulin, Total: 2 g/dL (ref 1.5–4.5)
Glucose: 115 mg/dL — ABNORMAL HIGH (ref 65–99)
Potassium: 4.5 mmol/L (ref 3.5–5.2)
Sodium: 138 mmol/L (ref 134–144)
Total Protein: 6.1 g/dL (ref 6.0–8.5)

## 2020-04-29 LAB — LIPID PANEL
Chol/HDL Ratio: 3.1 ratio (ref 0.0–4.4)
Cholesterol, Total: 184 mg/dL (ref 100–199)
HDL: 59 mg/dL (ref >39)
LDL Chol Calc (NIH): 107 mg/dL — ABNORMAL HIGH (ref 0–99)
Triglycerides: 98 mg/dL (ref 0–149)
VLDL Cholesterol Cal: 18 mg/dL (ref 5–40)

## 2020-04-29 NOTE — Telephone Encounter (Signed)
Written by Virginia Crews, MD on 04/29/2020 8:14 AM EST Seen by patient Misty Page on 04/29/2020 8:15 AM

## 2020-04-29 NOTE — Telephone Encounter (Signed)
-----   Message from Virginia Crews, MD sent at 04/29/2020  8:14 AM EST ----- Stable labs

## 2020-05-01 DIAGNOSIS — C4922 Malignant neoplasm of connective and soft tissue of left lower limb, including hip: Secondary | ICD-10-CM | POA: Diagnosis not present

## 2020-05-01 DIAGNOSIS — K649 Unspecified hemorrhoids: Secondary | ICD-10-CM | POA: Diagnosis not present

## 2020-05-01 DIAGNOSIS — K5100 Ulcerative (chronic) pancolitis without complications: Secondary | ICD-10-CM | POA: Diagnosis not present

## 2020-05-01 DIAGNOSIS — R194 Change in bowel habit: Secondary | ICD-10-CM | POA: Diagnosis not present

## 2020-05-01 DIAGNOSIS — R140 Abdominal distension (gaseous): Secondary | ICD-10-CM | POA: Diagnosis not present

## 2020-05-06 DIAGNOSIS — Z7409 Other reduced mobility: Secondary | ICD-10-CM | POA: Diagnosis not present

## 2020-05-06 DIAGNOSIS — Z89622 Acquired absence of left hip joint: Secondary | ICD-10-CM | POA: Diagnosis not present

## 2020-05-07 ENCOUNTER — Other Ambulatory Visit (INDEPENDENT_AMBULATORY_CARE_PROVIDER_SITE_OTHER): Payer: Self-pay | Admitting: Nurse Practitioner

## 2020-05-07 DIAGNOSIS — G458 Other transient cerebral ischemic attacks and related syndromes: Secondary | ICD-10-CM

## 2020-05-07 DIAGNOSIS — K551 Chronic vascular disorders of intestine: Secondary | ICD-10-CM

## 2020-05-07 DIAGNOSIS — Z95820 Peripheral vascular angioplasty status with implants and grafts: Secondary | ICD-10-CM

## 2020-05-12 ENCOUNTER — Encounter (INDEPENDENT_AMBULATORY_CARE_PROVIDER_SITE_OTHER): Payer: Self-pay | Admitting: Vascular Surgery

## 2020-05-12 ENCOUNTER — Ambulatory Visit (INDEPENDENT_AMBULATORY_CARE_PROVIDER_SITE_OTHER): Payer: PPO

## 2020-05-12 ENCOUNTER — Ambulatory Visit (INDEPENDENT_AMBULATORY_CARE_PROVIDER_SITE_OTHER): Payer: PPO | Admitting: Vascular Surgery

## 2020-05-12 ENCOUNTER — Other Ambulatory Visit: Payer: Self-pay

## 2020-05-12 VITALS — BP 171/93 | HR 86 | Ht 61.0 in | Wt 123.0 lb

## 2020-05-12 DIAGNOSIS — K551 Chronic vascular disorders of intestine: Secondary | ICD-10-CM

## 2020-05-12 DIAGNOSIS — G458 Other transient cerebral ischemic attacks and related syndromes: Secondary | ICD-10-CM

## 2020-05-12 DIAGNOSIS — Z95820 Peripheral vascular angioplasty status with implants and grafts: Secondary | ICD-10-CM

## 2020-05-12 DIAGNOSIS — K559 Vascular disorder of intestine, unspecified: Secondary | ICD-10-CM

## 2020-05-12 DIAGNOSIS — E7800 Pure hypercholesterolemia, unspecified: Secondary | ICD-10-CM

## 2020-05-12 DIAGNOSIS — I25118 Atherosclerotic heart disease of native coronary artery with other forms of angina pectoris: Secondary | ICD-10-CM

## 2020-05-12 DIAGNOSIS — I739 Peripheral vascular disease, unspecified: Secondary | ICD-10-CM

## 2020-05-12 DIAGNOSIS — I6523 Occlusion and stenosis of bilateral carotid arteries: Secondary | ICD-10-CM | POA: Diagnosis not present

## 2020-05-17 ENCOUNTER — Encounter (INDEPENDENT_AMBULATORY_CARE_PROVIDER_SITE_OTHER): Payer: Self-pay | Admitting: Vascular Surgery

## 2020-05-17 NOTE — Progress Notes (Signed)
MRN : [id number]  Misty Page is a 79 y.o. (1940-07-12) female who presents with chief complaint of  Chief Complaint  Patient presents with  . Carotid  .  History of Present Illness:   Patient presents today after intervention on 02/13/2020 for acute on chronic mesenteric ischemia.  The patient had severe abdominal pain which is subsequently found to have stenosis of the superior mesenteric artery as well as celiac artery.  The patient also has a known history of colitis.  The patient notes that since she has had her intervention done her abdominal pain has essentially ceased.  She denies any nausea or vomiting.  She denies any food phobias.  She denies any fever, chills, nausea, vomiting or diarrhea.  There was also a stent placed within the SMA.    Noninvasive studies today show a largest aortic diameter of 2.5 cm.  The stent placed in the superior mesenteric artery is patent.    Current Meds  Medication Sig  . amLODipine (NORVASC) 5 MG tablet Take 1 tablet (5 mg total) by mouth daily.  . Ascorbic Acid (VITAMIN C) 1000 MG tablet Take 500 mg by mouth daily.   Marland Kitchen aspirin EC 81 MG EC tablet Take 1 tablet (81 mg total) by mouth daily. Swallow whole.  . Calcium-Vitamin D-Vitamin K 500-100-40 MG-UNT-MCG CHEW Chew 1 tablet by mouth 2 (two) times daily. VIACTIV, 500-100-40 (Oral Tablet Chewable)  1 tablet twice a day for 0 days  Quantity: 0.00;  Refills: 0   Ordered :13-Apr-2010  Celene Kras, MA, Anastasiya ;  Started 29-October-2008 Active Comments: DX: 733.00  . clopidogrel (PLAVIX) 75 MG tablet Take 1 tablet (75 mg total) by mouth daily.  Marland Kitchen ezetimibe (ZETIA) 10 MG tablet Take 1 tablet (10 mg total) by mouth daily.  . famotidine (PEPCID) 20 MG tablet TAKE 1 TABLET BY MOUTH 2 TIMES DAILY  . ferrous sulfate 325 (65 FE) MG EC tablet Take 325 mg by mouth daily. FERROUS SULFATE, 325 (65 Fe)MG (Oral Tablet Delayed Release)  1 every day for 0 days  Quantity: 0.00;  Refills: 0   Ordered :13-Apr-2010   Celene Kras, MA, Anastasiya ;  Started 29-October-2008 Active Comments: DX: 285.9  . fexofenadine (ALLEGRA) 180 MG tablet Take 180 mg by mouth at bedtime.   . fluticasone (FLONASE) 50 MCG/ACT nasal spray USE 2 SPRAYS EACH NOSTRIL DAILY  . losartan (COZAAR) 50 MG tablet Take 1 tablet (50 mg total) by mouth daily.  . Mesalamine 800 MG TBEC Take 1,600 mg by mouth 2 (two) times daily.   . methimazole (TAPAZOLE) 5 MG tablet Take 2.5 mg by mouth daily.   . rosuvastatin (CRESTOR) 10 MG tablet Take 1 tablet (10 mg total) by mouth daily.    Past Medical History:  Diagnosis Date  . Allergy   . Anemia   . Arthritis   . CA of skin 10/15/2014  . Calculus of kidney 10/15/2014   Seen in ER 06/16/10.   . Colitis   . Complication of anesthesia    nausea  . Complication of internal prosthetic device 01/27/2009  . Coronary artery disease   . Diverticulitis   . Environmental and seasonal allergies   . Fibrocystic breast disease (FCBD)   . GERD (gastroesophageal reflux disease)   . Heart murmur   . High cholesterol   . History of heart artery stent   . Hyperlipidemia   . Hypertension   . Hyperthyroidism   . Myocardial infarction ([place])   . Osteomyelitis of  left femur ([place]) 11/30/2018  . Osteoporosis   . Reactive depression 02/25/2019  . Thyroid disease   . TIA (transient ischemic attack)     Past Surgical History:  Procedure Laterality Date  . ABDOMINAL HYSTERECTOMY  2007  . BREAST ENHANCEMENT SURGERY  2004  . BREAST IMPLANT REMOVAL    . BREAST SURGERY  1984   Fibrocystic Disease  . CAROTID ENDARTERECTOMY Left 06/21/2014   Dr. Delana Meyer  . CAROTID ENDARTERECTOMY Right 08/02/2014   Dr. Delana Meyer  . CAROTID PTA/STENT INTERVENTION N/A 06/15/2017   Procedure: CAROTID PTA/STENT INTERVENTION;  Surgeon: Katha Cabal, MD;  Location: [place] [facility];  Service: Cardiovascular;  Laterality: N/A;  . COLONOSCOPY WITH PROPOFOL N/A 07/14/2015   Procedure: COLONOSCOPY WITH PROPOFOL;  Surgeon: Hulen Luster,  MD;  Location[facility] ENDOSCOPY;  Service: Gastroenterology;  Laterality: N/A;  . COLONOSCOPY WITH PROPOFOL N/A 02/08/2018   Procedure: COLONOSCOPY WITH PROPOFOL;  Surgeon: Toledo, Benay Pike, MD;  Location: [facility] ENDOSCOPY;  Service: Gastroenterology;  Laterality: N/A;  . CORONARY ANGIOPLASTY WITH STENT PLACEMENT  1999  . ESOPHAGOGASTRODUODENOSCOPY (EGD) WITH PROPOFOL N/A 02/08/2018   Procedure: ESOPHAGOGASTRODUODENOSCOPY (EGD) WITH PROPOFOL;  Surgeon: Toledo, Benay Pike, MD;  Location: [facility] ENDOSCOPY;  Service: Gastroenterology;  Laterality: N/A;  . FEMORAL ARTERY STENT  08/2003  . HERNIA REPAIR  2008  . LEG AMPUTATION AT HIP Left 02/28/2019  . MASTECTOMY    . RENAL ARTERY STENT  08/2003  . TRANSUMBILICAL AUGMENTATION MAMMAPLASTY    . VASCULAR SURGERY    . VISCERAL ARTERY INTERVENTION N/A 02/13/2020   Procedure: VISCERAL ARTERY INTERVENTION;  Surgeon: Algernon Huxley, MD;  Location: [place];  Service: Cardiovascular;  Laterality: N/A;    Social History Social History   Tobacco Use  . Smoking status: Former Smoker    Quit date: 05/30/1994    Years since quitting: 25.9  . Smokeless tobacco: Never Used  Vaping Use  . Vaping Use: Never used  Substance Use Topics  . Alcohol use: Not Currently    Comment: rare- 1 glass of wine EOW  . Drug use: No    Family History Family History  Problem Relation Age of Onset  . Hyperlipidemia Mother   . Congestive Heart Failure Mother   . COPD Mother   . Heart disease Mother   . Hypertension Mother   . Osteoporosis Mother   . Pancreatic cancer Father   . Arthritis Sister   . Alzheimer's disease Sister   . Hypertension Sister   . Prostate cancer Brother   . Heart attack Brother   . Stomach cancer Brother   . Kidney failure Brother   . Leukemia Brother   . Emphysema Brother   . Pancreatic cancer Brother   . Stomach cancer Brother   . Breast cancer Paternal Grandmother   . Leukemia Paternal Grandfather   . Heart attack Maternal  Uncle   . Stroke Maternal Aunt     Allergies  Allergen Reactions  . Amoxicillin Anaphylaxis  . Naproxen Hives, Shortness Of Breath, Nausea And Vomiting and Swelling  . Penicillins Shortness Of Breath, Swelling and Rash    Has patient had a PCN reaction causing immediate rash, facial/tongue/throat swelling, SOB or lightheadedness with hypotension: Yes Has patient had a PCN reaction causing severe rash involving mucus membranes or skin necrosis: Yes Has patient had a PCN reaction that required hospitalization: Yes Has patient had a PCN reaction occurring within the last 10 years: No  If all of the above  answers are "NO", then may proceed with Cephalosporin use.   . Codeine Nausea And Vomiting  . Levofloxacin Nausea And Vomiting  . Shellfish Allergy Hives, Diarrhea, Nausea And Vomiting and Swelling     REVIEW OF SYSTEMS (Negative unless checked)  Constitutional: [] Weight loss  [] Fever  [] Chills Cardiac: [] Chest pain   [] Chest pressure   [] Palpitations   [] Shortness of breath when laying flat   [] Shortness of breath with exertion. Vascular:  [] Pain in legs with walking   [] Pain in legs at rest  [] History of DVT   [] Phlebitis   [] Swelling in legs   [] Varicose veins   [] Non-healing ulcers Pulmonary:   [] Uses home oxygen   [] Productive cough   [] Hemoptysis   [] Wheeze  [] COPD   [] Asthma Neurologic:  [] Dizziness   [] Seizures   [] History of stroke   [] History of TIA  [] Aphasia   [] Vissual changes   [] Weakness or numbness in arm   [] Weakness or numbness in leg Musculoskeletal:   [] Joint swelling   [] Joint pain   [] Low back pain Hematologic:  [] Easy bruising  [] Easy bleeding   [] Hypercoagulable state   [] Anemic Gastrointestinal:  [] Diarrhea   [] Vomiting  [] Gastroesophageal reflux/heartburn   [] Difficulty swallowing. Genitourinary:  [] Chronic kidney disease   [] Difficult urination  [] Frequent urination   [] Blood in urine Skin:  [] Rashes   [] Ulcers  Psychological:  [] History of anxiety   []   History of major depression.  Physical Examination  Vitals:   05/12/20 1133  BP: (!) 171/93  Pulse: 86  Weight: 123 lb (55.8 kg)  Height: 5' 1"  (1.549 m)   Body mass index is 23.24 kg/m. Gen: WD/WN, NAD Head: [place]/AT, No temporalis wasting.  Ear/Nose/Throat: Hearing grossly intact, nares w/o erythema or drainage Eyes: PER, EOMI, sclera nonicteric.  Neck: Supple, no large masses.   Pulmonary:  Good air movement, no audible wheezing bilaterally, no use of accessory muscles.  Cardiac: RRR, no JVD Vascular: bilateral carotid bruits; Vessel Right Left  Radial Palpable Palpable  PT Not Palpable Not Palpable  DP Not Palpable Not Palpable  Gastrointestinal: Non-distended. No guarding/no peritoneal signs.  Musculoskeletal: M/S 5/5 throughout.  No deformity or atrophy.  Neurologic: CN 2-12 intact. Symmetrical.  Speech is fluent. Motor exam as listed above. Psychiatric: Judgment intact, Mood & affect appropriate for pt's clinical situation. Dermatologic: No rashes or ulcers noted.  No changes consistent with cellulitis.   CBC Lab Results  Component Value Date   WBC 8.7 02/14/2020   HGB 13.5 02/14/2020   HCT 39.2 02/14/2020   MCV 94.5 02/14/2020   PLT 259 02/14/2020    BMET    Component Value Date/Time   NA 138 04/28/2020 1143   NA 140 06/22/2014 0417   K 4.5 04/28/2020 1143   K 3.5 06/22/2014 0417   CL 99 04/28/2020 1143   CL 105 06/22/2014 0417   CO2 28 04/28/2020 1143   CO2 27 06/22/2014 0417   GLUCOSE 115 (H) 04/28/2020 1143   GLUCOSE 152 (H) 02/14/2020 0024   GLUCOSE 92 06/22/2014 0417   BUN 16 04/28/2020 1143   BUN 9 06/22/2014 0417   CREATININE 0.49 (L) 04/28/2020 1143   CREATININE 0.65 06/22/2014 0417   CALCIUM 9.7 04/28/2020 1143   CALCIUM 7.7 (L) 06/22/2014 0417   GFRNONAA 93 04/28/2020 1143   GFRNONAA >60 06/22/2014 0417   GFRNONAA >60 02/26/2013 1510   GFRAA 107 04/28/2020 1143   GFRAA >60 06/22/2014 0417   GFRAA >60 02/26/2013 1510   Estimated  Creatinine  Clearance: 43 mL/min (A) (by C-G formula based on SCr of 0.49 mg/dL (L)).  COAG Lab Results  Component Value Date   INR 0.8 02/12/2020   INR 0.9 05/17/2019   INR 1.0 03/23/2019    Radiology VAS [place] MESENTERIC  Result Date: 05/12/2020 ABDOMINAL VISCERAL Other Factors: S/P Angioplasty with Stent. Vascular Interventions: 02/13/2020: Aortogram and Selective Angiogram of the                         SMA. Stent to the SMA with 11m diameter x 26 mm length                         lifestream balloon expandable stent. Comparison Study: 03/13/2020 Performing Technologist: SAlmira CoasterRVS  Examination Guidelines: A complete evaluation includes B-mode imaging, spectral Doppler, color Doppler, and power Doppler as needed of all accessible portions of each vessel. Bilateral testing is considered an integral part of a complete examination. Limited examinations for reoccurring indications may be performed as noted.  Duplex Findings: +----------------------+--------+--------+------+--------+ Mesenteric            PSV cm/sEDV cm/sPlaqueComments +----------------------+--------+--------+------+--------+ Aorta Prox               74      16                  +----------------------+--------+--------+------+--------+ Aorta Mid                81      23                  +----------------------+--------+--------+------+--------+ Aorta Distal             65      20                  +----------------------+--------+--------+------+--------+ Celiac Artery Origin    294      30                  +----------------------+--------+--------+------+--------+ Celiac Artery Proximal  310      34                  +----------------------+--------+--------+------+--------+ SMA Origin              299      30                  +----------------------+--------+--------+------+--------+ SMA Proximal            362      32                   +----------------------+--------+--------+------+--------+ SMA Mid                 371      32                  +----------------------+--------+--------+------+--------+ SMA Distal              303      36                  +----------------------+--------+--------+------+--------+ CHA                     166      19                  +----------------------+--------+--------+------+--------+ Splenic  165      18                  +----------------------+--------+--------+------+--------+    Summary: Largest Aortic Diameter: 2.1 cm  Mesenteric: Normal Hepatic artery and Splenic artery findings. 70 to 99% stenosis in the celiac artery and superior mesenteric artery.  The Superior Mesenteric Artery Stent is patent, no change for prior study.  *See table(s) above for measurements and observations.  Diagnosing physician: Hortencia Pilar MD  Electronically signed by Hortencia Pilar MD on 05/12/2020 at 4:35:53 PM.    Final    VAS US CAROTID  Result Date: 05/12/2020 Carotid Arterial Duplex Study Indications:       Carotid artery disease. Other Factors:     H/O bilateral CEA;                    06/15/17: Innominate artery stents;. Comparison Study:  11/26/2019 Performing Technologist: Almira Coaster RVS  Examination Guidelines: A complete evaluation includes B-mode imaging, spectral Doppler, color Doppler, and power Doppler as needed of all accessible portions of each vessel. Bilateral testing is considered an integral part of a complete examination. Limited examinations for reoccurring indications may be performed as noted.  Right Carotid Findings: +----------+-------+-------+--------+---------------------------------+--------+           PSV    EDV    StenosisPlaque Description               Comments           cm/s   cm/s                                                     +----------+-------+-------+--------+---------------------------------+--------+ CCA Prox  82      15             heterogenous, irregular and                                               calcific                                  +----------+-------+-------+--------+---------------------------------+--------+ CCA Mid   63     16             heterogenous, irregular and                                               calcific                                  +----------+-------+-------+--------+---------------------------------+--------+ CCA Distal58     15                                                       +----------+-------+-------+--------+---------------------------------+--------+ ICA Prox  38     11                                                       +----------+-------+-------+--------+---------------------------------+--------+  ICA Mid   72     21                                                       +----------+-------+-------+--------+---------------------------------+--------+ ICA Distal68     18                                                       +----------+-------+-------+--------+---------------------------------+--------+ ECA       72     9                                                        +----------+-------+-------+--------+---------------------------------+--------+ +----------+--------+-------+--------+-------------------+           PSV cm/sEDV cmsDescribeArm Pressure (mmHG) +----------+--------+-------+--------+-------------------+ Subclavian100     0                                  +----------+--------+-------+--------+-------------------+ +---------+--------+--+--------+--+ VertebralPSV cm/s54EDV cm/s11 +---------+--------+--+--------+--+  Left Carotid Findings: +----------+--------+--------+--------+------------------+--------+           PSV cm/sEDV cm/sStenosisPlaque DescriptionComments +----------+--------+--------+--------+------------------+--------+ CCA Prox  73      11               calcific                   +----------+--------+--------+--------+------------------+--------+ CCA Mid   94      13              calcific                   +----------+--------+--------+--------+------------------+--------+ CCA Distal85      14              calcific                   +----------+--------+--------+--------+------------------+--------+ ICA Prox  64      14                                         +----------+--------+--------+--------+------------------+--------+ ICA Mid   99      20                                         +----------+--------+--------+--------+------------------+--------+ ICA Distal92      18                                         +----------+--------+--------+--------+------------------+--------+ ECA       131     0                                          +----------+--------+--------+--------+------------------+--------+ +----------+--------+--------+--------+-------------------+  PSV cm/sEDV cm/sDescribeArm Pressure (mmHG) +----------+--------+--------+--------+-------------------+ Subclavian69      0                                   +----------+--------+--------+--------+-------------------+ +---------+--------+--+--------+-+ VertebralPSV cm/s66EDV cm/s9 +---------+--------+--+--------+-+   Summary: Right Carotid: Velocities in the right ICA are consistent with a 1-39% stenosis. Left Carotid: Velocities in the left ICA are consistent with a 1-39% stenosis. Vertebrals:  Bilateral vertebral arteries demonstrate antegrade flow. Subclavians: Normal flow hemodynamics were seen in bilateral subclavian              arteries. Monophasic Lt Subclavian Artery. *See table(s) above for measurements and observations.  Electronically signed by Hortencia Pilar MD on 05/12/2020 at 4:35:47 PM.    Final      Assessment/Plan 1. Bilateral carotid artery stenosis Recommend:  Given the patient's asymptomatic subcritical  stenosis no further invasive testing or surgery at this time.  Duplex ultrasound shows <30% stenosis bilaterally.  Continue antiplatelet therapy as prescribed Continue management of CAD, HTN and Hyperlipidemia Healthy heart diet,  encouraged exercise at least 4 times per week Follow up in 6 months with duplex ultrasound and physical exam  - VAS US CAROTID; Future  2. Mesenteric ischemia (HCC) Recommend:  The patient is status post successful angiogram with intervention of the mesenteric vessels.  SMA stent  was performed.  The patient reports that the abdominal pain is improved and the post prandial symptoms are essentially gone.   The patient denies lifestyle limiting changes at this point in time.  No further invasive studies, angiography or surgery at this time The patient should continue walking and begin a more formal exercise program.  The patient should continue antiplatelet therapy and aggressive treatment of the lipid abnormalities  Patient should undergo noninvasive studies as ordered. The patient will follow up with me after the studies.   - VAS [place] MESENTERIC; Future  3. Peripheral vascular disease ([place]) Recommend:  I do not find evidence of life style limiting vascular disease. The patient specifically denies life style limitation.  Previous noninvasive studies including ABI's of the legs do not identify critical vascular problems.  The patient should continue walking and begin a more formal exercise program. The patient should continue his antiplatelet therapy and aggressive treatment of the lipid abnormalities.  The patient should begin wearing graduated compression socks 15-20 mmHg strength to control her mild edema.  4. Atherosclerosis of native coronary artery of native heart with other form of angina pectoris ([name]) Continue cardiac and antihypertensive medications as already ordered and reviewed, no changes at this time.  Continue statin as ordered and  reviewed, no changes at this time  Nitrates PRN for chest pain   5. Hypercholesteremia Continue statin as ordered and reviewed, no changes at this time    Hortencia Pilar, MD  05/17/2020 5:44 PM

## 2020-06-06 DIAGNOSIS — Z89622 Acquired absence of left hip joint: Secondary | ICD-10-CM | POA: Diagnosis not present

## 2020-06-06 DIAGNOSIS — Z7409 Other reduced mobility: Secondary | ICD-10-CM | POA: Diagnosis not present

## 2020-06-24 DIAGNOSIS — R2242 Localized swelling, mass and lump, left lower limb: Secondary | ICD-10-CM | POA: Diagnosis not present

## 2020-06-24 DIAGNOSIS — C4922 Malignant neoplasm of connective and soft tissue of left lower limb, including hip: Secondary | ICD-10-CM | POA: Diagnosis not present

## 2020-06-30 DIAGNOSIS — K5100 Ulcerative (chronic) pancolitis without complications: Secondary | ICD-10-CM | POA: Diagnosis not present

## 2020-07-01 DIAGNOSIS — K5100 Ulcerative (chronic) pancolitis without complications: Secondary | ICD-10-CM | POA: Diagnosis not present

## 2020-07-07 DIAGNOSIS — Z7409 Other reduced mobility: Secondary | ICD-10-CM | POA: Diagnosis not present

## 2020-07-07 DIAGNOSIS — Z89622 Acquired absence of left hip joint: Secondary | ICD-10-CM | POA: Diagnosis not present

## 2020-07-14 ENCOUNTER — Other Ambulatory Visit: Payer: Self-pay | Admitting: Physician Assistant

## 2020-07-22 DIAGNOSIS — Z9889 Other specified postprocedural states: Secondary | ICD-10-CM | POA: Diagnosis not present

## 2020-07-22 DIAGNOSIS — C4922 Malignant neoplasm of connective and soft tissue of left lower limb, including hip: Secondary | ICD-10-CM | POA: Diagnosis not present

## 2020-07-22 DIAGNOSIS — R918 Other nonspecific abnormal finding of lung field: Secondary | ICD-10-CM | POA: Diagnosis not present

## 2020-07-22 LAB — HEPATIC FUNCTION PANEL
ALT: 13 (ref 7–35)
AST: 12 — AB (ref 13–35)
Alkaline Phosphatase: 62 (ref 25–125)
Bilirubin, Total: 0.5

## 2020-07-22 LAB — BASIC METABOLIC PANEL
BUN: 16 (ref 4–21)
CO2: 29 — AB (ref 13–22)
Chloride: 98 — AB (ref 99–108)
Glucose: 248
Potassium: 4.2 (ref 3.4–5.3)
Sodium: 137 (ref 137–147)

## 2020-07-22 LAB — COMPREHENSIVE METABOLIC PANEL
Albumin: 4.1 (ref 3.5–5.0)
Calcium: 9.1 (ref 8.7–10.7)
GFR calc non Af Amer: >90

## 2020-07-22 LAB — CBC AND DIFFERENTIAL
HCT: 44 (ref 36–46)
Hemoglobin: 14.3 (ref 12.0–16.0)
Neutrophils Absolute: 12.7
Platelets: 327 (ref 150–399)
WBC: 13.3

## 2020-07-22 LAB — CBC: RBC: 4.68 (ref 3.87–5.11)

## 2020-08-04 DIAGNOSIS — Z7409 Other reduced mobility: Secondary | ICD-10-CM | POA: Diagnosis not present

## 2020-08-04 DIAGNOSIS — Z89622 Acquired absence of left hip joint: Secondary | ICD-10-CM | POA: Diagnosis not present

## 2020-08-06 DIAGNOSIS — E042 Nontoxic multinodular goiter: Secondary | ICD-10-CM | POA: Diagnosis not present

## 2020-08-06 DIAGNOSIS — E0590 Thyrotoxicosis, unspecified without thyrotoxic crisis or storm: Secondary | ICD-10-CM | POA: Diagnosis not present

## 2020-08-06 LAB — TSH: TSH: 0.22 — AB (ref <=5.90)

## 2020-09-04 DIAGNOSIS — Z7409 Other reduced mobility: Secondary | ICD-10-CM | POA: Diagnosis not present

## 2020-09-04 DIAGNOSIS — Z89622 Acquired absence of left hip joint: Secondary | ICD-10-CM | POA: Diagnosis not present

## 2020-09-19 DIAGNOSIS — M7581 Other shoulder lesions, right shoulder: Secondary | ICD-10-CM | POA: Diagnosis not present

## 2020-09-19 DIAGNOSIS — M75111 Incomplete rotator cuff tear or rupture of right shoulder, not specified as traumatic: Secondary | ICD-10-CM | POA: Diagnosis not present

## 2020-09-19 DIAGNOSIS — K51919 Ulcerative colitis, unspecified with unspecified complications: Secondary | ICD-10-CM | POA: Diagnosis not present

## 2020-10-24 ENCOUNTER — Encounter: Payer: Self-pay | Admitting: Family Medicine

## 2020-10-24 ENCOUNTER — Ambulatory Visit (INDEPENDENT_AMBULATORY_CARE_PROVIDER_SITE_OTHER): Payer: PPO | Admitting: Family Medicine

## 2020-10-24 ENCOUNTER — Other Ambulatory Visit: Payer: Self-pay

## 2020-10-24 VITALS — BP 136/64 | HR 74 | Temp 98.0°F | Resp 16 | Wt 124.9 lb

## 2020-10-24 DIAGNOSIS — Z89612 Acquired absence of left leg above knee: Secondary | ICD-10-CM | POA: Diagnosis not present

## 2020-10-24 DIAGNOSIS — Z0000 Encounter for general adult medical examination without abnormal findings: Secondary | ICD-10-CM | POA: Diagnosis not present

## 2020-10-24 DIAGNOSIS — I739 Peripheral vascular disease, unspecified: Secondary | ICD-10-CM | POA: Diagnosis not present

## 2020-10-24 DIAGNOSIS — I10 Essential (primary) hypertension: Secondary | ICD-10-CM | POA: Diagnosis not present

## 2020-10-24 DIAGNOSIS — K51911 Ulcerative colitis, unspecified with rectal bleeding: Secondary | ICD-10-CM

## 2020-10-24 DIAGNOSIS — C499 Malignant neoplasm of connective and soft tissue, unspecified: Secondary | ICD-10-CM | POA: Diagnosis not present

## 2020-10-24 DIAGNOSIS — I25118 Atherosclerotic heart disease of native coronary artery with other forms of angina pectoris: Secondary | ICD-10-CM | POA: Diagnosis not present

## 2020-10-24 NOTE — Assessment & Plan Note (Signed)
Well controlled Continue current medications Reviewed recent metabolic panel F/u in 6 months

## 2020-10-24 NOTE — Assessment & Plan Note (Signed)
Followed by cardiology  Continue RF management

## 2020-10-24 NOTE — Assessment & Plan Note (Signed)
Status post left hip disarticulation Followed by physical medicine and rehabilitation Stable

## 2020-10-24 NOTE — Progress Notes (Signed)
Annual Wellness Visit     Patient: Misty Page, Female    DOB: [birth year], 80 y.o.   MRN: [id number] Visit Date: 10/24/2020  Today's Provider: Lavon Paganini, MD   Chief Complaint  Patient presents with  . Medicare Wellness   Subjective    Misty Page is a 80 y.o. female who presents today for her Annual Wellness Visit. She reports consuming a general diet. The patient does not participate in regular exercise at present. She generally feels fairly well. She reports sleeping fairly well. She does not have additional problems to discuss today.   HPI       Medications: Outpatient Medications Prior to Visit  Medication Sig  . amLODipine (NORVASC) 5 MG tablet Take 1 tablet (5 mg total) by mouth daily.  . Ascorbic Acid (VITAMIN C) 1000 MG tablet Take 500 mg by mouth daily.   Marland Kitchen aspirin EC 81 MG EC tablet Take 1 tablet (81 mg total) by mouth daily. Swallow whole.  . Calcium-Vitamin D-Vitamin K 500-100-40 MG-UNT-MCG CHEW Chew 1 tablet by mouth 2 (two) times daily. VIACTIV, 500-100-40 (Oral Tablet Chewable)  1 tablet twice a day for 0 days  Quantity: 0.00;  Refills: 0   Ordered :13-Apr-2010  Celene Kras, MA, Anastasiya ;  Started 29-October-2008 Active Comments: DX: 733.00  . clopidogrel (PLAVIX) 75 MG tablet Take 1 tablet (75 mg total) by mouth daily.  . Cranberry-Cholecalciferol 4200-500 MG-UNIT CAPS Take by mouth.  . ezetimibe (ZETIA) 10 MG tablet Take 1 tablet (10 mg total) by mouth daily.  . famotidine (PEPCID) 20 MG tablet TAKE 1 TABLET BY MOUTH 2 TIMES DAILY  . ferrous sulfate 325 (65 FE) MG EC tablet Take 325 mg by mouth daily. FERROUS SULFATE, 325 (65 Fe)MG (Oral Tablet Delayed Release)  1 every day for 0 days  Quantity: 0.00;  Refills: 0   Ordered :13-Apr-2010  Celene Kras, MA, Anastasiya ;  Started 29-October-2008 Active Comments: DX: 285.9  . fexofenadine (ALLEGRA) 180 MG tablet Take 180 mg by mouth at bedtime.   . fluticasone (FLONASE) 50 MCG/ACT nasal spray USE 2  SPRAYS EACH NOSTRIL DAILY  . losartan (COZAAR) 50 MG tablet Take 1 tablet (50 mg total) by mouth daily.  . Mesalamine 800 MG TBEC Take 1,600 mg by mouth 2 (two) times daily.   . methimazole (TAPAZOLE) 5 MG tablet Take 2.5 mg by mouth daily.   . Probiotic Product (PROBIOTIC DAILY PO) Take by mouth.  . rosuvastatin (CRESTOR) 10 MG tablet Take 1 tablet (10 mg total) by mouth daily.   No facility-administered medications prior to visit.    Allergies  Allergen Reactions  . Amoxicillin Anaphylaxis  . Naproxen Hives, Shortness Of Breath, Nausea And Vomiting and Swelling  . Penicillins Shortness Of Breath, Swelling and Rash    Has patient had a PCN reaction causing immediate rash, facial/tongue/throat swelling, SOB or lightheadedness with hypotension: Yes Has patient had a PCN reaction causing severe rash involving mucus membranes or skin necrosis: Yes Has patient had a PCN reaction that required hospitalization: Yes Has patient had a PCN reaction occurring within the last 10 years: No  If all of the above answers are "NO", then may proceed with Cephalosporin use.   . Codeine Nausea And Vomiting  . Levofloxacin Nausea And Vomiting  . Shellfish Allergy Hives, Diarrhea, Nausea And Vomiting and Swelling    Patient Care Team: Virginia Crews, MD as PCP - General (Family Medicine) Schnier, Dolores Lory, MD as Consulting Physician (Vascular  Surgery) Corey Skains, MD as Consulting Physician (Cardiology) Lonia Farber, MD as Consulting Physician (Internal Medicine) Poggi, Marshall Cork, MD as Consulting Physician (Surgery) Adron Bene as Physician Assistant (Gastroenterology) Mar Daring, [name] as Physician Assistant (Family Medicine) Reva Bores, MD as Referring Physician (Hematology and Oncology) Magda Bernheim, MD as Referring Physician (Orthopedic Surgery) Dasher, Rayvon Char, MD (Dermatology)  Review of Systems  Constitutional: Negative for chills, fatigue and fever.   HENT: Positive for postnasal drip. Negative for congestion, ear pain, rhinorrhea, sinus pain and sore throat.   Eyes: Positive for itching.  Respiratory: Negative for cough, shortness of breath and wheezing.   Cardiovascular: Negative for chest pain and leg swelling.  Gastrointestinal: Positive for blood in stool. Negative for abdominal pain, diarrhea, nausea and vomiting.  Genitourinary: Negative for dysuria, flank pain, frequency and urgency.  Neurological: Negative for dizziness and headaches.    Last CBC Lab Results  Component Value Date   WBC 13.3 07/22/2020   HGB 14.3 07/22/2020   HCT 44 07/22/2020   MCV 94.5 02/14/2020   MCH 32.5 02/14/2020   RDW 12.8 02/14/2020   PLT 327 29/93/7169   Last metabolic panel Lab Results  Component Value Date   GLUCOSE 115 (H) 04/28/2020   NA 137 07/22/2020   K 4.2 07/22/2020   CL 98 (A) 07/22/2020   CO2 29 (A) 07/22/2020   BUN 16 07/22/2020   CREATININE 0.49 (L) 04/28/2020   GFRNONAA >90 07/22/2020   GFRAA 107 04/28/2020   CALCIUM 9.1 07/22/2020   PROT 6.1 04/28/2020   ALBUMIN 4.1 07/22/2020   LABGLOB 2.0 04/28/2020   AGRATIO 2.1 04/28/2020   BILITOT 0.3 04/28/2020   ALKPHOS 62 07/22/2020   AST 12 (A) 07/22/2020   ALT 13 07/22/2020   ANIONGAP 10 02/14/2020   Last lipids Lab Results  Component Value Date   CHOL 184 04/28/2020   HDL 59 04/28/2020   LDLCALC 107 (H) 04/28/2020   TRIG 98 04/28/2020   CHOLHDL 3.1 04/28/2020   Last thyroid functions Lab Results  Component Value Date   TSH 0.22 (A) 08/06/2020   T4TOTAL 7.3 12/22/2016   Last vitamin B12 and Folate Lab Results  Component Value Date   VITAMINB12 487 11/21/2019   FOLATE 18.1 11/21/2019        Objective    Vitals: BP 136/64 (BP Location: Right Arm, Patient Position: Sitting, Cuff Size: Normal)   Pulse 74   Temp 98 F (36.7 C) (Oral)   Resp 16   Wt 124 lb 14.4 oz (56.7 kg)   SpO2 96%   BMI 23.60 kg/m  BP Readings from Last 3 Encounters:  10/24/20  136/64  05/12/20 (!) 171/93  04/28/20 138/68   Wt Readings from Last 3 Encounters:  10/24/20 124 lb 14.4 oz (56.7 kg)  05/12/20 123 lb (55.8 kg)  04/28/20 125 lb (56.7 kg)      Physical Exam Vitals reviewed.  Constitutional:      General: She is not in acute distress.    Appearance: Normal appearance. She is well-developed. She is not diaphoretic.  HENT:     Head: Normocephalic and atraumatic.     Mouth/Throat:     Pharynx: No oropharyngeal exudate.  Eyes:     General: No scleral icterus.    Conjunctiva/sclera: Conjunctivae normal.     Pupils: Pupils are equal, round, and reactive to light.  Neck:     Thyroid: No thyromegaly.  Cardiovascular:     Rate and  Rhythm: Normal rate and regular rhythm.     Pulses: Normal pulses.     Heart sounds: Murmur heard.    Pulmonary:     Effort: Pulmonary effort is normal. No respiratory distress.     Breath sounds: Normal breath sounds. No wheezing, rhonchi or rales.  Abdominal:     General: There is no distension.     Palpations: Abdomen is soft.     Tenderness: There is no abdominal tenderness.  Musculoskeletal:        General: No deformity.     Cervical back: Neck supple.     Right lower leg: No swelling. No edema.     Left lower leg: No edema.     Left Lower Extremity: Left leg is amputated above knee.  Lymphadenopathy:     Cervical: No cervical adenopathy.  Skin:    General: Skin is warm and dry.     Findings: No rash.  Neurological:     Mental Status: She is alert and oriented to person, place, and time. Mental status is at baseline.     Sensory: No sensory deficit.     Motor: No weakness.     Gait: Gait normal.  Psychiatric:        Mood and Affect: Mood normal.        Behavior: Behavior normal.        Thought Content: Thought content normal.     Most recent functional status assessment: In your present state of health, do you have any difficulty performing the following activities: 10/24/2020  Hearing? N  Vision?  N  Difficulty concentrating or making decisions? N  Walking or climbing stairs? Y  Dressing or bathing? Y  Doing errands, shopping? Y  Comment -  Some recent data might be hidden   Most recent fall risk assessment: Fall Risk  10/24/2020  Falls in the past year? 0  Comment -  Number falls in past yr: 0  Comment -  Injury with Fall? 0  Risk for fall due to : No Fall Risks  Risk for fall due to: Comment -  Follow up Falls evaluation completed    Most recent depression screenings: PHQ 2/9 Scores 10/24/2020 04/28/2020  PHQ - 2 Score 3 1  PHQ- 9 Score 9 2   Most recent cognitive screening: 6CIT Screen 10/24/2020  What Year? 0 points  What month? 0 points  What time? 0 points  Count back from 20 0 points  Months in reverse 0 points  Repeat phrase 2 points  Total Score 2   Most recent Audit-C alcohol use screening Alcohol Use Disorder Test (AUDIT) 10/24/2020  1. How often do you have a drink containing alcohol? 1  2. How many drinks containing alcohol do you have on a typical day when you are drinking? 0  3. How often do you have six or more drinks on one occasion? 0  AUDIT-C Score 1  Alcohol Brief Interventions/Follow-up -   A score of 3 or more in women, and 4 or more in men indicates increased risk for alcohol abuse, EXCEPT if all of the points are from question 1   Results for orders placed or performed in visit on 10/24/20  CBC and differential  Result Value Ref Range   Hemoglobin 14.3 12.0 - 16.0   HCT 44 36 - 46   Neutrophils Absolute 12.70    Platelets 327 150 - 399   WBC 13.3   CBC  Result Value Ref Range  RBC 4.68 3.87 - 8.29  Basic metabolic panel  Result Value Ref Range   Glucose 248    BUN 16 4 - 21   CO2 29 (A) 13 - 22   Potassium 4.2 3.4 - 5.3   Sodium 137 137 - 147   Chloride 98 (A) 99 - 108  Comprehensive metabolic panel  Result Value Ref Range   GFR calc non Af Amer >90    Calcium 9.1 8.7 - 10.7   Albumin 4.1 3.5 - 5.0  Hepatic function panel   Result Value Ref Range   Alkaline Phosphatase 62 25 - 125   ALT 13 7 - 35   AST 12 (A) 13 - 35   Bilirubin, Total 0.5   TSH  Result Value Ref Range   TSH 0.22 (A) 0.41 - 5.90    Assessment & Plan     Annual wellness visit done today including the all of the following: Reviewed patient's Family Medical History Reviewed and updated list of patient's medical providers Assessment of cognitive impairment was done Assessed patient's functional ability Established a written schedule for health screening services Health Risk Assessent Completed and Reviewed  Exercise Activities and Dietary recommendations Goals    . DIET - INCREASE WATER INTAKE     Recommend increasing water intake to 4 glasses of water a day.     Marland Kitchen LIFESTYLE - DECREASE FALLS RISK     Recommend to remove any items from the home that may cause slips or trips.       Immunization History  Administered Date(s) Administered  . Fluad Quad(high Dose 65+) 02/14/2020  . Influenza Split 04/19/2011  . Influenza, High Dose Seasonal PF 02/18/2014, 03/12/2016, 03/07/2019  . Influenza,inj,Quad PF,6+ Mos 02/10/2013, 03/07/2017, 03/24/2018  . Influenza-Unspecified 02/10/2013, 03/01/2015  . PFIZER Comirnaty(Gray Top)Covid-19 Tri-Sucrose Vaccine 06/25/2019, 07/16/2019, 04/04/2020  . Pneumococcal Conjugate-13 05/18/2013  . Pneumococcal Polysaccharide-23 03/02/2004, 08/15/2017  . Td 02/08/2005, 11/04/2017  . Zoster, Live 01/29/2006    Health Maintenance  Topic Date Due  . Hepatitis C Screening  Never done  . Zoster Vaccines- Shingrix (1 of 2) Never done  . COVID-19 Vaccine (4 - Booster for Pfizer series) 07/05/2020  . INFLUENZA VACCINE  12/29/2020  . TETANUS/TDAP  11/05/2027  . DEXA SCAN  Completed  . PNA vac Low Risk Adult  Completed  . HPV VACCINES  Aged Out     Discussed health benefits of physical activity, and encouraged her to engage in regular exercise appropriate for her age and condition.    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     Frederic Jericho Moorehead,acting as a Education administrator for Lavon Paganini, MD.,have documented all relevant documentation on the behalf of Lavon Paganini, MD,as directed by  Lavon Paganini, MD while in the presence of Lavon Paganini, MD.   I, Lavon Paganini, MD, have reviewed all documentation for this visit. The documentation on 10/24/20 for the exam, diagnosis, procedures, and orders are all accurate and complete.   Daryan Cagley, Dionne Bucy, MD, MPH [facility]

## 2020-10-24 NOTE — Patient Instructions (Signed)
 The CDC recommends two doses of Shingrix (the shingles vaccine) separated by 2 to 6 months for adults age 80 years and older. I recommend checking with your insurance plan regarding coverage for this vaccine.     Preventive Care 65 Years and Older, Female Preventive care refers to lifestyle choices and visits with your health care provider that can promote health and wellness. This includes:  A yearly physical exam. This is also called an annual wellness visit.  Regular dental and eye exams.  Immunizations.  Screening for certain conditions.  Healthy lifestyle choices, such as: ? Eating a healthy diet. ? Getting regular exercise. ? Not using drugs or products that contain nicotine and tobacco. ? Limiting alcohol use. What can I expect for my preventive care visit? Physical exam Your health care provider will check your:  Height and weight. These may be used to calculate your BMI (body mass index). BMI is a measurement that tells if you are at a healthy weight.  Heart rate and blood pressure.  Body temperature.  Skin for abnormal spots. Counseling Your health care provider may ask you questions about your:  Past medical problems.  Family's medical history.  Alcohol, tobacco, and drug use.  Emotional well-being.  Home life and relationship well-being.  Sexual activity.  Diet, exercise, and sleep habits.  History of falls.  Memory and ability to understand (cognition).  Work and work environment.  Pregnancy and menstrual history.  Access to firearms. What immunizations do I need? Vaccines are usually given at various ages, according to a schedule. Your health care provider will recommend vaccines for you based on your age, medical history, and lifestyle or other factors, such as travel or where you work.   What tests do I need? Blood tests  Lipid and cholesterol levels. These may be checked every 5 years, or more often depending on your overall  health.  Hepatitis C test.  Hepatitis B test. Screening  Lung cancer screening. You may have this screening every year starting at age 55 if you have a 30-pack-year history of smoking and currently smoke or have quit within the past 15 years.  Colorectal cancer screening. ? All adults should have this screening starting at age 80 and continuing until age 75. ? Your health care provider may recommend screening at age 45 if you are at increased risk. ? You will have tests every 1-10 years, depending on your results and the type of screening test.  Diabetes screening. ? This is done by checking your blood sugar (glucose) after you have not eaten for a while (fasting). ? You may have this done every 1-3 years.  Mammogram. ? This may be done every 1-2 years. ? Talk with your health care provider about how often you should have regular mammograms.  Abdominal aortic aneurysm (AAA) screening. You may need this if you are a current or former smoker.  BRCA-related cancer screening. This may be done if you have a family history of breast, ovarian, tubal, or peritoneal cancers. Other tests  STD (sexually transmitted disease) testing, if you are at risk.  Bone density scan. This is done to screen for osteoporosis. You may have this done starting at age 65. Talk with your health care provider about your test results, treatment options, and if necessary, the need for more tests. Follow these instructions at home: Eating and drinking  Eat a diet that includes fresh fruits and vegetables, whole grains, lean protein, and low-fat dairy products. Limit your intake   of foods with high amounts of sugar, saturated fats, and salt.  Take vitamin and mineral supplements as recommended by your health care provider.  Do not drink alcohol if your health care provider tells you not to drink.  If you drink alcohol: ? Limit how much you have to 0-1 drink a day. ? Be aware of how much alcohol is in your  drink. In the [place] one drink equals one 12 oz bottle of beer (355 mL), one 5 oz glass of wine (148 mL), or one 1 oz glass of hard liquor (44 mL).   Lifestyle  Take daily care of your teeth and gums. Brush your teeth every morning and night with fluoride toothpaste. Floss one time each day.  Stay active. Exercise for at least 30 minutes 5 or more days each week.  Do not use any products that contain nicotine or tobacco, such as cigarettes, e-cigarettes, and chewing tobacco. If you need help quitting, ask your health care provider.  Do not use drugs.  If you are sexually active, practice safe sex. Use a condom or other form of protection in order to prevent STIs (sexually transmitted infections).  Talk with your health care provider about taking a low-dose aspirin or statin.  Find healthy ways to cope with stress, such as: ? Meditation, yoga, or listening to music. ? Journaling. ? Talking to a trusted person. ? Spending time with friends and family. Safety  Always wear your seat belt while driving or riding in a vehicle.  Do not drive: ? If you have been drinking alcohol. Do not ride with someone who has been drinking. ? When you are tired or distracted. ? While texting.  Wear a helmet and other protective equipment during sports activities.  If you have firearms in your house, make sure you follow all gun safety procedures. What's next?  Visit your health care provider once a year for an annual wellness visit.  Ask your health care provider how often you should have your eyes and teeth checked.  Stay up to date on all vaccines. This information is not intended to replace advice given to you by your health care provider. Make sure you discuss any questions you have with your health care provider. Document Revised: 05/07/2020 Document Reviewed: 05/11/2018 Elsevier Patient Education  2021 Elsevier Inc.  

## 2020-10-24 NOTE — Assessment & Plan Note (Signed)
Followed by vascular surgery Continue dual antiplatelet therapy and statin

## 2020-10-24 NOTE — Assessment & Plan Note (Signed)
Followed by GI Upcoming appointment

## 2020-10-30 DIAGNOSIS — I2119 ST elevation (STEMI) myocardial infarction involving other coronary artery of inferior wall: Secondary | ICD-10-CM | POA: Diagnosis not present

## 2020-10-30 DIAGNOSIS — K5730 Diverticulosis of large intestine without perforation or abscess without bleeding: Secondary | ICD-10-CM | POA: Diagnosis not present

## 2020-10-30 DIAGNOSIS — I2510 Atherosclerotic heart disease of native coronary artery without angina pectoris: Secondary | ICD-10-CM | POA: Diagnosis not present

## 2020-10-30 DIAGNOSIS — Z7901 Long term (current) use of anticoagulants: Secondary | ICD-10-CM | POA: Insufficient documentation

## 2020-10-30 DIAGNOSIS — K5100 Ulcerative (chronic) pancolitis without complications: Secondary | ICD-10-CM | POA: Diagnosis not present

## 2020-10-30 DIAGNOSIS — I739 Peripheral vascular disease, unspecified: Secondary | ICD-10-CM | POA: Diagnosis not present

## 2020-11-03 ENCOUNTER — Other Ambulatory Visit: Payer: Self-pay | Admitting: Family Medicine

## 2020-11-03 DIAGNOSIS — I10 Essential (primary) hypertension: Secondary | ICD-10-CM

## 2020-11-07 ENCOUNTER — Other Ambulatory Visit: Payer: Self-pay | Admitting: Family Medicine

## 2020-11-13 ENCOUNTER — Other Ambulatory Visit (INDEPENDENT_AMBULATORY_CARE_PROVIDER_SITE_OTHER): Payer: Self-pay | Admitting: Vascular Surgery

## 2020-11-13 DIAGNOSIS — K559 Vascular disorder of intestine, unspecified: Secondary | ICD-10-CM

## 2020-11-13 DIAGNOSIS — I6523 Occlusion and stenosis of bilateral carotid arteries: Secondary | ICD-10-CM

## 2020-11-14 ENCOUNTER — Other Ambulatory Visit: Payer: Self-pay | Admitting: Family Medicine

## 2020-11-14 DIAGNOSIS — E7800 Pure hypercholesterolemia, unspecified: Secondary | ICD-10-CM

## 2020-11-14 NOTE — Telephone Encounter (Signed)
Requested medication (s) are due for refill today: expired medications and one discontinued medication  Requested medication (s) are on the active medication list: yes  Last refill:  norvasc- 10/22/19 #90 3 refills , zetia-  10/22/19 #90 3 refills  Future visit scheduled: yes   Notes to clinic:  expired medications,, crestor 20 mg discontinued 04/04/20.    Requested Prescriptions  Pending Prescriptions Disp Refills   amLODipine (NORVASC) 5 MG tablet [Pharmacy Med Name: AMLODIPINE BESYLATE 5 MG TAB] 90 tablet 3    Sig: TAKE 1 TABLET BY MOUTH DAILY      Cardiovascular:  Calcium Channel Blockers Passed - 11/14/2020  4:07 PM      Passed - Last BP in normal range    BP Readings from Last 1 Encounters:  10/24/20 136/64          Passed - Valid encounter within last 6 months    Recent Outpatient Visits           3 weeks ago Encounter for annual wellness visit (AWV) in Medicare patient   [facility], Dionne Bucy, MD   6 months ago Essential (primary) hypertension   [facility] [facility], Dionne Bucy, MD   1 year ago Annual physical exam   [facility] [place], Dionne Bucy, MD   1 year ago [place], Dionne Bucy, MD   1 year ago Delirium   [facility] [place], Dionne Bucy, MD       Future Appointments             In 5 months [name], Dionne Bucy, MD [facility]               ezetimibe (ZETIA) 10 MG tablet [Pharmacy Med Name: EZETIMIBE 10 MG TAB] 90 tablet 3    Sig: TAKE 1 TABLET BY MOUTH DAILY      Cardiovascular:  Antilipid - Sterol Transport Inhibitors Failed - 11/14/2020  4:07 PM      Failed - LDL in normal range and within 360 days    LDL Chol Calc (NIH)  Date Value Ref Range Status  04/28/2020 107 (H) 0 - 99 mg/dL Final          Passed - Total Cholesterol in normal range and within 360 days    Cholesterol, Total  Date Value Ref Range Status   04/28/2020 184 100 - 199 mg/dL Final          Passed - HDL in normal range and within 360 days    HDL  Date Value Ref Range Status  04/28/2020 59 >39 mg/dL Final          Passed - Triglycerides in normal range and within 360 days    Triglycerides  Date Value Ref Range Status  04/28/2020 98 0 - 149 mg/dL Final          Passed - Valid encounter within last 12 months    Recent Outpatient Visits           3 weeks ago Encounter for annual wellness visit (AWV) in Medicare patient   [facility] [facility], Dionne Bucy, MD   6 months ago Essential (primary) hypertension   [facility] [facility], Dionne Bucy, MD   1 year ago Annual physical exam   [facility] [place], Dionne Bucy, MD   1 year ago [place], Dionne Bucy, MD   1 year ago  Delirium   [facility] [place], Dionne Bucy, MD       Future Appointments             In 5 months [name], Dionne Bucy, MD [facility]               rosuvastatin (CRESTOR) 20 MG tablet [Pharmacy Med Name: ROSUVASTATIN CALCIUM 20 MG TAB] 90 tablet 0    Sig: TAKE ONE-HALF TABLET BY MOUTH DAILY      Cardiovascular:  Antilipid - Statins Failed - 11/14/2020  4:07 PM      Failed - LDL in normal range and within 360 days    LDL Chol Calc (NIH)  Date Value Ref Range Status  04/28/2020 107 (H) 0 - 99 mg/dL Final          Passed - Total Cholesterol in normal range and within 360 days    Cholesterol, Total  Date Value Ref Range Status  04/28/2020 184 100 - 199 mg/dL Final          Passed - HDL in normal range and within 360 days    HDL  Date Value Ref Range Status  04/28/2020 59 >39 mg/dL Final          Passed - Triglycerides in normal range and within 360 days    Triglycerides  Date Value Ref Range Status  04/28/2020 98 0 - 149 mg/dL Final          Passed - Patient is not pregnant      Passed - Valid encounter  within last 12 months    Recent Outpatient Visits           3 weeks ago Encounter for annual wellness visit (AWV) in Medicare patient   [facility] [facility], Dionne Bucy, MD   6 months ago Essential (primary) hypertension   [employer] [facility], Dionne Bucy, MD   1 year ago Annual physical exam   [facility] [place], Dionne Bucy, MD   1 year ago [place], Dionne Bucy, MD   1 year ago Delirium   [facility] [name], Dionne Bucy, MD       Future Appointments             In 5 months [name], Dionne Bucy, MD [facility] [place]

## 2020-11-17 ENCOUNTER — Ambulatory Visit (INDEPENDENT_AMBULATORY_CARE_PROVIDER_SITE_OTHER): Payer: PPO

## 2020-11-17 ENCOUNTER — Other Ambulatory Visit: Payer: Self-pay

## 2020-11-17 ENCOUNTER — Encounter (INDEPENDENT_AMBULATORY_CARE_PROVIDER_SITE_OTHER): Payer: Self-pay | Admitting: Vascular Surgery

## 2020-11-17 ENCOUNTER — Other Ambulatory Visit: Payer: Self-pay | Admitting: Family Medicine

## 2020-11-17 ENCOUNTER — Ambulatory Visit (INDEPENDENT_AMBULATORY_CARE_PROVIDER_SITE_OTHER): Payer: PPO | Admitting: Vascular Surgery

## 2020-11-17 VITALS — BP 183/80 | HR 73 | Ht 60.0 in | Wt 124.0 lb

## 2020-11-17 DIAGNOSIS — I6523 Occlusion and stenosis of bilateral carotid arteries: Secondary | ICD-10-CM

## 2020-11-17 DIAGNOSIS — E7800 Pure hypercholesterolemia, unspecified: Secondary | ICD-10-CM | POA: Diagnosis not present

## 2020-11-17 DIAGNOSIS — I10 Essential (primary) hypertension: Secondary | ICD-10-CM

## 2020-11-17 DIAGNOSIS — I2510 Atherosclerotic heart disease of native coronary artery without angina pectoris: Secondary | ICD-10-CM

## 2020-11-17 DIAGNOSIS — K559 Vascular disorder of intestine, unspecified: Secondary | ICD-10-CM

## 2020-11-17 NOTE — Progress Notes (Signed)
MRN : [id number]  Misty Page is a 80 y.o. ([birth year]) female who presents with chief complaint of  Chief Complaint  Patient presents with   Follow-up    6 mo mesenteric carotid   .  History of Present Illness:   Patient presents today after intervention on 02/13/2020 for acute on chronic mesenteric ischemia.  The patient notes that since she has had her intervention done her abdominal pain has imporved.  She denies any nausea or vomiting.  She denies any food phobias.  She denies any fever, chills, nausea, vomiting or diarrhea.  There was also a stent placed within the SMA.     The stent placed in the superior mesenteric artery is patent.    Carotid duplex shows <40% stenosis bilaterally  Current Meds  Medication Sig   amLODipine (NORVASC) 5 MG tablet TAKE 1 TABLET BY MOUTH DAILY   Ascorbic Acid (VITAMIN C) 1000 MG tablet Take 500 mg by mouth daily.    aspirin EC 81 MG EC tablet Take 1 tablet (81 mg total) by mouth daily. Swallow whole.   Calcium-Vitamin D-Vitamin K 500-100-40 MG-UNT-MCG CHEW Chew 1 tablet by mouth 2 (two) times daily. VIACTIV, 500-100-40 (Oral Tablet Chewable)  1 tablet twice a day for 0 days  Quantity: 0.00;  Refills: 0   Ordered :13-Apr-2010  Karoline Caldwell, Anastasiya ;  Started 29-October-2008 Active Comments: DX: 733.00   clopidogrel (PLAVIX) 75 MG tablet TAKE 1 TABLET BY MOUTH DAILY   Cranberry-Cholecalciferol 4200-500 MG-UNIT CAPS Take by mouth.   ezetimibe (ZETIA) 10 MG tablet TAKE 1 TABLET BY MOUTH DAILY   famotidine (PEPCID) 20 MG tablet TAKE 1 TABLET BY MOUTH 2 TIMES DAILY   ferrous sulfate 325 (65 FE) MG EC tablet Take 325 mg by mouth daily. FERROUS SULFATE, 325 (65 Fe)MG (Oral Tablet Delayed Release)  1 every day for 0 days  Quantity: 0.00;  Refills: 0   Ordered :13-Apr-2010  Celene Kras, MA, Anastasiya ;  Started 29-October-2008 Active Comments: DX: 285.9   fexofenadine (ALLEGRA) 180 MG tablet Take 180 mg by mouth at bedtime.    fluticasone (FLONASE) 50  MCG/ACT nasal spray USE 2 SPRAYS EACH NOSTRIL DAILY   losartan (COZAAR) 50 MG tablet TAKE 1 TABLET BY MOUTH DAILY   Mesalamine 800 MG TBEC Take 1,600 mg by mouth 2 (two) times daily.    methimazole (TAPAZOLE) 5 MG tablet Take 2.5 mg by mouth daily.    Probiotic Product (PROBIOTIC DAILY PO) Take by mouth.   rosuvastatin (CRESTOR) 10 MG tablet Take 1 tablet (10 mg total) by mouth daily.   rosuvastatin (CRESTOR) 20 MG tablet TAKE ONE-HALF TABLET BY MOUTH DAILY    Past Medical History:  Diagnosis Date   Allergy    Anemia    Arthritis    CA of skin 10/15/2014   Calculus of kidney 10/15/2014   Seen in ER 06/16/10.    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   Coronary artery disease    Diverticulitis    Environmental and seasonal allergies    Fibrocystic breast disease (FCBD)    GERD (gastroesophageal reflux disease)    Heart murmur    High cholesterol    History of heart artery stent    Hyperlipidemia    Hypertension    Hyperthyroidism    Myocardial infarction ([place])    Osteomyelitis of left femur ([place]) 11/30/2018   Osteoporosis    Reactive  depression 02/25/2019   Thyroid disease    TIA (transient ischemic attack)     Past Surgical History:  Procedure Laterality Date   ABDOMINAL HYSTERECTOMY  2007   BREAST ENHANCEMENT SURGERY  2004   BREAST IMPLANT REMOVAL     BREAST SURGERY  1984   Fibrocystic Disease   CAROTID ENDARTERECTOMY Left 06/21/2014   Dr. Delana Meyer   CAROTID ENDARTERECTOMY Right 08/02/2014   Dr. Delana Meyer   CAROTID PTA/STENT INTERVENTION N/A 06/15/2017   Procedure: CAROTID PTA/STENT INTERVENTION;  Surgeon: Katha Cabal, MD;  Location: [place] [facility];  Service: Cardiovascular;  Laterality: N/A;   COLONOSCOPY WITH PROPOFOL N/A 07/14/2015   Procedure: COLONOSCOPY WITH PROPOFOL;  Surgeon: Hulen Luster, MD;  Location[facility] END[facility]COPY;  Service: Gastroenterology;  Laterality: N/A;   COLONOSCOPY WITH  PROPOFOL N/A 02/08/2018   Procedure: COLONOSCOPY WITH PROPOFOL;  Surgeon: Toledo, Benay Pike, MD;  Location: [facility] ENDOSCOPY;  Service: Gastroenterology;  Laterality: N/A;   CORONARY ANGIOPLASTY WITH STENT PLACEMENT  1999   ESOPHAGOGASTRODUODENOSCOPY (EGD) WITH PROPOFOL N/A 02/08/2018   Procedure: ESOPHAGOGASTRODUODENOSCOPY (EGD) WITH PROPOFOL;  Surgeon: Toledo, Benay Pike, MD;  Location: [facility] ENDOSCOPY;  Service: Gastroenterology;  Laterality: N/A;   FEMORAL ARTERY STENT  08/2003   HERNIA REPAIR  2008   LEG AMPUTATION AT HIP Left 02/28/2019   MASTECTOMY     RENAL ARTERY STENT  07/5425   TRANSUMBILICAL AUGMENTATION MAMMAPLASTY     VASCULAR SURGERY     VISCERAL ARTERY INTERVENTION N/A 02/13/2020   Procedure: VISCERAL ARTERY INTERVENTION;  Surgeon: Algernon Huxley, MD;  Location: [place] [facility];  Service: Cardiovascular;  Laterality: N/A;    Social History Social History   Tobacco Use   Smoking status: Former    Pack years: 0.00    Types: Cigarettes    Quit date: 05/30/1994    Years since quitting: 26.4   Smokeless tobacco: Never  Vaping Use   Vaping Use: Never used  Substance Use Topics   Alcohol use: Not Currently    Comment: rare- 1 glass of wine EOW   Drug use: No    Family History Family History  Problem Relation Age of Onset   Hyperlipidemia Mother    Congestive Heart Failure Mother    COPD Mother    Heart disease Mother    Hypertension Mother    Osteoporosis Mother    Pancreatic cancer Father    Arthritis Sister    Alzheimer's disease Sister    Hypertension Sister    Prostate cancer Brother    Heart attack Brother    Stomach cancer Brother    Kidney failure Brother    Leukemia Brother    Emphysema Brother    Pancreatic cancer Brother    Stomach cancer Brother    Breast cancer Paternal Grandmother    Leukemia Paternal Grandfather    Heart attack Maternal Uncle    Stroke Maternal Aunt     Allergies  Allergen Reactions   Amoxicillin Anaphylaxis    Naproxen Hives, Shortness Of Breath, Nausea And Vomiting and Swelling   Penicillins Shortness Of Breath, Swelling and Rash    Has patient had a PCN reaction causing immediate rash, facial/tongue/throat swelling, SOB or lightheadedness with hypotension: Yes Has patient had a PCN reaction causing severe rash involving mucus membranes or skin necrosis: Yes Has patient had a PCN reaction that required hospitalization: Yes Has patient had a PCN reaction occurring within the last 10 years: No  If all of the above answers are "  NO", then may proceed with Cephalosporin use.    Codeine Nausea And Vomiting   Levofloxacin Nausea And Vomiting   Shellfish Allergy Hives, Diarrhea, Nausea And Vomiting and Swelling     REVIEW OF SYSTEMS (Negative unless checked)  Constitutional: [] Weight loss  [] Fever  [] Chills Cardiac: [] Chest pain   [] Chest pressure   [] Palpitations   [] Shortness of breath when laying flat   [] Shortness of breath with exertion. Vascular:  [] Pain in legs with walking   [] Pain in legs at rest  [] History of DVT   [] Phlebitis   [] Swelling in legs   [] Varicose veins   [] Non-healing ulcers Pulmonary:   [] Uses home oxygen   [] Productive cough   [] Hemoptysis   [] Wheeze  [] COPD   [] Asthma Neurologic:  [] Dizziness   [] Seizures   [] History of stroke   [] History of TIA  [] Aphasia   [] Vissual changes   [] Weakness or numbness in arm   [] Weakness or numbness in leg Musculoskeletal:   [] Joint swelling   [] Joint pain   [] Low back pain Hematologic:  [] Easy bruising  [] Easy bleeding   [] Hypercoagulable state   [] Anemic Gastrointestinal:  [] Diarrhea   [] Vomiting  [] Gastroesophageal reflux/heartburn   [] Difficulty swallowing. Genitourinary:  [] Chronic kidney disease   [] Difficult urination  [] Frequent urination   [] Blood in urine Skin:  [] Rashes   [] Ulcers  Psychological:  [] History of anxiety   []  History of major depression.  Physical Examination  Vitals:   11/17/20 1015  BP: (!) 183/80  Pulse: 73   Weight: 124 lb (56.2 kg)  Height: 5' (1.524 m)   Body mass index is 24.22 kg/m. Gen: WD/WN, NAD Head: [place]/AT, No temporalis wasting.  Ear/Nose/Throat: Hearing grossly intact, nares w/o erythema or drainage Eyes: PER, EOMI, sclera nonicteric.  Neck: Supple, no large masses.   Pulmonary:  Good air movement, no audible wheezing bilaterally, no use of accessory muscles.  Cardiac: RRR, no JVD Vascular:   bilateral CEA scars Vessel Right Left  Radial Palpable Palpable  Carotid Palpable Palpable  Gastrointestinal: Non-distended. No guarding/no peritoneal signs.  Musculoskeletal: M/S 5/5 throughout.  No deformity or atrophy.  Neurologic: CN 2-12 intact. Symmetrical.  Speech is fluent. Motor exam as listed above. Psychiatric: Judgment intact, Mood & affect appropriate for pt's clinical situation. Dermatologic: No rashes or ulcers noted.  No changes consistent with cellulitis.   CBC Lab Results  Component Value Date   WBC 13.3 07/22/2020   HGB 14.3 07/22/2020   HCT 44 07/22/2020   MCV 94.5 02/14/2020   PLT 327 07/22/2020    BMET    Component Value Date/Time   NA 137 07/22/2020 0000   NA 140 06/22/2014 0417   K 4.2 07/22/2020 0000   K 3.5 06/22/2014 0417   CL 98 (A) 07/22/2020 0000   CL 105 06/22/2014 0417   CO2 29 (A) 07/22/2020 0000   CO2 27 06/22/2014 0417   GLUCOSE 115 (H) 04/28/2020 1143   GLUCOSE 152 (H) 02/14/2020 0024   GLUCOSE 92 06/22/2014 0417   BUN 16 07/22/2020 0000   BUN 9 06/22/2014 0417   CREATININE 0.49 (L) 04/28/2020 1143   CREATININE 0.65 06/22/2014 0417   CALCIUM 9.1 07/22/2020 0000   CALCIUM 7.7 (L) 06/22/2014 0417   GFRNONAA >90 07/22/2020 0000   GFRNONAA >60 06/22/2014 0417   GFRNONAA >60 02/26/2013 1510   GFRAA 107 04/28/2020 1143   GFRAA >60 06/22/2014 0417   GFRAA >60 02/26/2013 1510   CrCl cannot be calculated (Patient's most recent lab result is  older than the maximum 21 days allowed.).  COAG Lab Results  Component Value Date   INR  0.8 02/12/2020   INR 0.9 05/17/2019   INR 1.0 03/23/2019    Radiology No results found.   Assessment/Plan 1. Mesenteric ischemia (HCC) Recommend:   The patient is status post successful angiogram with intervention of the mesenteric vessels.  SMA stent  was performed.  The patient reports that the abdominal pain is improved and the post prandial symptoms are essentially gone.   The patient denies lifestyle limiting changes at this point in time.   No further invasive studies, angiography or surgery at this time The patient should continue walking and begin a more formal exercise program. The patient should continue antiplatelet therapy and aggressive treatment of the lipid abnormalities   Patient should undergo noninvasive studies as ordered. The patient will follow up with me after the studies.   - VAS [place] MESENTERIC; Future  2. Bilateral carotid artery stenosis Recommend:   Given the patient's asymptomatic subcritical stenosis no further invasive testing or surgery at this time.   Duplex ultrasound shows <40% stenosis bilaterally s/p bilateral CEA.   Continue antiplatelet therapy as prescribed Continue management of CAD, HTN and Hyperlipidemia Healthy heart diet,  encouraged exercise at least 4 times per week Follow up in 6 months with duplex ultrasound and physical exam  3. Arteriosclerosis of coronary artery Continue cardiac and antihypertensive medications as already ordered and reviewed, no changes at this time.  Continue statin as ordered and reviewed, no changes at this time  Nitrates PRN for chest pain   4. Essential (primary) hypertension Continue antihypertensive medications as already ordered, these medications have been reviewed and there are no changes at this time.   5. Hypercholesteremia Continue statin as ordered and reviewed, no changes at this time    Hortencia Pilar, MD  11/17/2020 10:22 AM

## 2020-11-20 DIAGNOSIS — D6869 Other thrombophilia: Secondary | ICD-10-CM | POA: Diagnosis not present

## 2020-11-20 DIAGNOSIS — I480 Paroxysmal atrial fibrillation: Secondary | ICD-10-CM | POA: Diagnosis not present

## 2020-11-20 DIAGNOSIS — E785 Hyperlipidemia, unspecified: Secondary | ICD-10-CM | POA: Diagnosis not present

## 2020-11-20 DIAGNOSIS — I739 Peripheral vascular disease, unspecified: Secondary | ICD-10-CM | POA: Diagnosis not present

## 2020-11-20 DIAGNOSIS — D692 Other nonthrombocytopenic purpura: Secondary | ICD-10-CM | POA: Diagnosis not present

## 2020-11-20 DIAGNOSIS — I708 Atherosclerosis of other arteries: Secondary | ICD-10-CM | POA: Diagnosis not present

## 2020-11-20 DIAGNOSIS — Z7982 Long term (current) use of aspirin: Secondary | ICD-10-CM | POA: Diagnosis not present

## 2020-11-20 DIAGNOSIS — I10 Essential (primary) hypertension: Secondary | ICD-10-CM | POA: Diagnosis not present

## 2020-11-20 DIAGNOSIS — L989 Disorder of the skin and subcutaneous tissue, unspecified: Secondary | ICD-10-CM | POA: Diagnosis not present

## 2020-11-20 DIAGNOSIS — I700 Atherosclerosis of aorta: Secondary | ICD-10-CM | POA: Diagnosis not present

## 2020-11-20 DIAGNOSIS — Z7902 Long term (current) use of antithrombotics/antiplatelets: Secondary | ICD-10-CM | POA: Diagnosis not present

## 2020-11-20 DIAGNOSIS — I25118 Atherosclerotic heart disease of native coronary artery with other forms of angina pectoris: Secondary | ICD-10-CM | POA: Diagnosis not present

## 2020-12-22 DIAGNOSIS — M7581 Other shoulder lesions, right shoulder: Secondary | ICD-10-CM | POA: Diagnosis not present

## 2020-12-22 DIAGNOSIS — M75111 Incomplete rotator cuff tear or rupture of right shoulder, not specified as traumatic: Secondary | ICD-10-CM | POA: Diagnosis not present

## 2021-01-16 DIAGNOSIS — D225 Melanocytic nevi of trunk: Secondary | ICD-10-CM | POA: Diagnosis not present

## 2021-01-16 DIAGNOSIS — D2262 Melanocytic nevi of left upper limb, including shoulder: Secondary | ICD-10-CM | POA: Diagnosis not present

## 2021-01-16 DIAGNOSIS — D485 Neoplasm of uncertain behavior of skin: Secondary | ICD-10-CM | POA: Diagnosis not present

## 2021-01-16 DIAGNOSIS — D2272 Melanocytic nevi of left lower limb, including hip: Secondary | ICD-10-CM | POA: Diagnosis not present

## 2021-01-16 DIAGNOSIS — L821 Other seborrheic keratosis: Secondary | ICD-10-CM | POA: Diagnosis not present

## 2021-01-16 DIAGNOSIS — D2271 Melanocytic nevi of right lower limb, including hip: Secondary | ICD-10-CM | POA: Diagnosis not present

## 2021-01-16 DIAGNOSIS — D2261 Melanocytic nevi of right upper limb, including shoulder: Secondary | ICD-10-CM | POA: Diagnosis not present

## 2021-01-16 DIAGNOSIS — D0471 Carcinoma in situ of skin of right lower limb, including hip: Secondary | ICD-10-CM | POA: Diagnosis not present

## 2021-01-30 ENCOUNTER — Other Ambulatory Visit: Payer: Self-pay | Admitting: Family Medicine

## 2021-02-06 DIAGNOSIS — D0471 Carcinoma in situ of skin of right lower limb, including hip: Secondary | ICD-10-CM | POA: Diagnosis not present

## 2021-02-11 DIAGNOSIS — E042 Nontoxic multinodular goiter: Secondary | ICD-10-CM | POA: Diagnosis not present

## 2021-02-11 DIAGNOSIS — E0590 Thyrotoxicosis, unspecified without thyrotoxic crisis or storm: Secondary | ICD-10-CM | POA: Diagnosis not present

## 2021-03-10 DIAGNOSIS — Z85831 Personal history of malignant neoplasm of soft tissue: Secondary | ICD-10-CM | POA: Diagnosis not present

## 2021-03-10 DIAGNOSIS — Z87891 Personal history of nicotine dependence: Secondary | ICD-10-CM | POA: Diagnosis not present

## 2021-03-10 DIAGNOSIS — Z881 Allergy status to other antibiotic agents status: Secondary | ICD-10-CM | POA: Diagnosis not present

## 2021-03-10 DIAGNOSIS — R918 Other nonspecific abnormal finding of lung field: Secondary | ICD-10-CM | POA: Diagnosis not present

## 2021-03-10 DIAGNOSIS — Z08 Encounter for follow-up examination after completed treatment for malignant neoplasm: Secondary | ICD-10-CM | POA: Diagnosis not present

## 2021-03-10 DIAGNOSIS — Z886 Allergy status to analgesic agent status: Secondary | ICD-10-CM | POA: Diagnosis not present

## 2021-03-10 DIAGNOSIS — C4922 Malignant neoplasm of connective and soft tissue of left lower limb, including hip: Secondary | ICD-10-CM | POA: Diagnosis not present

## 2021-03-10 DIAGNOSIS — Z880 Allergy status to penicillin: Secondary | ICD-10-CM | POA: Diagnosis not present

## 2021-03-10 DIAGNOSIS — Z885 Allergy status to narcotic agent status: Secondary | ICD-10-CM | POA: Diagnosis not present

## 2021-03-10 DIAGNOSIS — Z9889 Other specified postprocedural states: Secondary | ICD-10-CM | POA: Diagnosis not present

## 2021-03-27 DIAGNOSIS — M7581 Other shoulder lesions, right shoulder: Secondary | ICD-10-CM | POA: Diagnosis not present

## 2021-03-27 DIAGNOSIS — M75111 Incomplete rotator cuff tear or rupture of right shoulder, not specified as traumatic: Secondary | ICD-10-CM | POA: Diagnosis not present

## 2021-04-12 ENCOUNTER — Inpatient Hospital Stay
Admission: EM | Admit: 2021-04-12 | Discharge: 2021-04-14 | DRG: 149 | Disposition: A | Discharge status: Home / Self Care | Payer: PPO | Attending: Internal Medicine | Admitting: Internal Medicine

## 2021-04-12 ENCOUNTER — Emergency Department: Payer: PPO

## 2021-04-12 ENCOUNTER — Other Ambulatory Visit: Payer: Self-pay

## 2021-04-12 DIAGNOSIS — I10 Essential (primary) hypertension: Secondary | ICD-10-CM | POA: Diagnosis not present

## 2021-04-12 DIAGNOSIS — I619 Nontraumatic intracerebral hemorrhage, unspecified: Secondary | ICD-10-CM | POA: Diagnosis not present

## 2021-04-12 DIAGNOSIS — Z8261 Family history of arthritis: Secondary | ICD-10-CM

## 2021-04-12 DIAGNOSIS — Z806 Family history of leukemia: Secondary | ICD-10-CM

## 2021-04-12 DIAGNOSIS — I2510 Atherosclerotic heart disease of native coronary artery without angina pectoris: Secondary | ICD-10-CM | POA: Diagnosis not present

## 2021-04-12 DIAGNOSIS — Z85828 Personal history of other malignant neoplasm of skin: Secondary | ICD-10-CM | POA: Diagnosis not present

## 2021-04-12 DIAGNOSIS — E039 Hypothyroidism, unspecified: Secondary | ICD-10-CM | POA: Diagnosis present

## 2021-04-12 DIAGNOSIS — R0902 Hypoxemia: Secondary | ICD-10-CM | POA: Diagnosis not present

## 2021-04-12 DIAGNOSIS — R110 Nausea: Secondary | ICD-10-CM | POA: Diagnosis not present

## 2021-04-12 DIAGNOSIS — R809 Proteinuria, unspecified: Secondary | ICD-10-CM | POA: Diagnosis present

## 2021-04-12 DIAGNOSIS — E0590 Thyrotoxicosis, unspecified without thyrotoxic crisis or storm: Secondary | ICD-10-CM | POA: Diagnosis not present

## 2021-04-12 DIAGNOSIS — Z8349 Family history of other endocrine, nutritional and metabolic diseases: Secondary | ICD-10-CM

## 2021-04-12 DIAGNOSIS — Z79899 Other long term (current) drug therapy: Secondary | ICD-10-CM

## 2021-04-12 DIAGNOSIS — Z8673 Personal history of transient ischemic attack (TIA), and cerebral infarction without residual deficits: Secondary | ICD-10-CM

## 2021-04-12 DIAGNOSIS — Z841 Family history of disorders of kidney and ureter: Secondary | ICD-10-CM

## 2021-04-12 DIAGNOSIS — R42 Dizziness and giddiness: Secondary | ICD-10-CM | POA: Diagnosis not present

## 2021-04-12 DIAGNOSIS — R1110 Vomiting, unspecified: Secondary | ICD-10-CM | POA: Diagnosis present

## 2021-04-12 DIAGNOSIS — Z800 Family history of malignant neoplasm of digestive organs: Secondary | ICD-10-CM

## 2021-04-12 DIAGNOSIS — Z880 Allergy status to penicillin: Secondary | ICD-10-CM

## 2021-04-12 DIAGNOSIS — Z881 Allergy status to other antibiotic agents status: Secondary | ICD-10-CM | POA: Diagnosis not present

## 2021-04-12 DIAGNOSIS — Z885 Allergy status to narcotic agent status: Secondary | ICD-10-CM | POA: Diagnosis not present

## 2021-04-12 DIAGNOSIS — Z803 Family history of malignant neoplasm of breast: Secondary | ICD-10-CM

## 2021-04-12 DIAGNOSIS — Z95820 Peripheral vascular angioplasty status with implants and grafts: Secondary | ICD-10-CM | POA: Diagnosis not present

## 2021-04-12 DIAGNOSIS — Z20822 Contact with and (suspected) exposure to covid-19: Secondary | ICD-10-CM | POA: Diagnosis present

## 2021-04-12 DIAGNOSIS — Z823 Family history of stroke: Secondary | ICD-10-CM

## 2021-04-12 DIAGNOSIS — Z89612 Acquired absence of left leg above knee: Secondary | ICD-10-CM

## 2021-04-12 DIAGNOSIS — Z8249 Family history of ischemic heart disease and other diseases of the circulatory system: Secondary | ICD-10-CM

## 2021-04-12 DIAGNOSIS — R112 Nausea with vomiting, unspecified: Secondary | ICD-10-CM | POA: Diagnosis not present

## 2021-04-12 DIAGNOSIS — Z90710 Acquired absence of both cervix and uterus: Secondary | ICD-10-CM

## 2021-04-12 DIAGNOSIS — Z8262 Family history of osteoporosis: Secondary | ICD-10-CM

## 2021-04-12 DIAGNOSIS — I6782 Cerebral ischemia: Secondary | ICD-10-CM | POA: Diagnosis not present

## 2021-04-12 DIAGNOSIS — K219 Gastro-esophageal reflux disease without esophagitis: Secondary | ICD-10-CM | POA: Diagnosis present

## 2021-04-12 DIAGNOSIS — I252 Old myocardial infarction: Secondary | ICD-10-CM | POA: Diagnosis not present

## 2021-04-12 DIAGNOSIS — E860 Dehydration: Secondary | ICD-10-CM | POA: Diagnosis not present

## 2021-04-12 DIAGNOSIS — Z8042 Family history of malignant neoplasm of prostate: Secondary | ICD-10-CM

## 2021-04-12 DIAGNOSIS — Z7902 Long term (current) use of antithrombotics/antiplatelets: Secondary | ICD-10-CM

## 2021-04-12 DIAGNOSIS — Z825 Family history of asthma and other chronic lower respiratory diseases: Secondary | ICD-10-CM

## 2021-04-12 DIAGNOSIS — I70202 Unspecified atherosclerosis of native arteries of extremities, left leg: Secondary | ICD-10-CM | POA: Diagnosis present

## 2021-04-12 DIAGNOSIS — R824 Acetonuria: Secondary | ICD-10-CM | POA: Diagnosis not present

## 2021-04-12 DIAGNOSIS — R1111 Vomiting without nausea: Secondary | ICD-10-CM | POA: Diagnosis not present

## 2021-04-12 DIAGNOSIS — Z886 Allergy status to analgesic agent status: Secondary | ICD-10-CM | POA: Diagnosis not present

## 2021-04-12 DIAGNOSIS — Z87891 Personal history of nicotine dependence: Secondary | ICD-10-CM

## 2021-04-12 DIAGNOSIS — Z820 Family history of epilepsy and other diseases of the nervous system: Secondary | ICD-10-CM

## 2021-04-12 DIAGNOSIS — Z7982 Long term (current) use of aspirin: Secondary | ICD-10-CM

## 2021-04-12 LAB — COMPREHENSIVE METABOLIC PANEL
ALT: 18 U/L (ref 0–44)
AST: 22 U/L (ref 15–41)
Albumin: 4.2 g/dL (ref 3.5–5.0)
Alkaline Phosphatase: 64 U/L (ref 38–126)
Anion gap: 11 (ref 5–15)
BUN: 18 mg/dL (ref 8–23)
CO2: 25 mmol/L (ref 22–32)
Calcium: 9.2 mg/dL (ref 8.9–10.3)
Chloride: 101 mmol/L (ref 98–111)
Creatinine, Ser: 0.45 mg/dL (ref 0.44–1.00)
GFR, Estimated: >60 mL/min (ref >60)
Glucose, Bld: 133 mg/dL — ABNORMAL HIGH (ref 70–99)
Potassium: 3.8 mmol/L (ref 3.5–5.1)
Sodium: 137 mmol/L (ref 135–145)
Total Bilirubin: 0.8 mg/dL (ref 0.3–1.2)
Total Protein: 7.3 g/dL (ref 6.5–8.1)

## 2021-04-12 LAB — CBC
HCT: 43.9 % (ref 36.0–46.0)
Hemoglobin: 15 g/dL (ref 12.0–15.0)
MCH: 31.8 pg (ref 26.0–34.0)
MCHC: 34.2 g/dL (ref 30.0–36.0)
MCV: 93.2 fL (ref 80.0–100.0)
Platelets: 345 10*3/uL (ref 150–400)
RBC: 4.71 MIL/uL (ref 3.87–5.11)
RDW: 13.7 % (ref 11.5–15.5)
WBC: 11.3 10*3/uL — ABNORMAL HIGH (ref 4.0–10.5)
nRBC: 0 % (ref 0.0–0.2)

## 2021-04-12 LAB — RESP PANEL BY RT-PCR (FLU A&B, COVID) ARPGX2
Influenza A by PCR: NEGATIVE
Influenza B by PCR: NEGATIVE
SARS Coronavirus 2 by RT PCR: NEGATIVE

## 2021-04-12 LAB — URINALYSIS, ROUTINE W REFLEX MICROSCOPIC
Bilirubin Urine: NEGATIVE
Glucose, UA: NEGATIVE mg/dL
Ketones, ur: 20 mg/dL — AB
Nitrite: NEGATIVE
Protein, ur: 30 mg/dL — AB
Specific Gravity, Urine: 1.016 (ref 1.005–1.030)
pH: 6 (ref 5.0–8.0)

## 2021-04-12 LAB — LIPASE, BLOOD: Lipase: 56 U/L — ABNORMAL HIGH (ref 11–51)

## 2021-04-12 MED ORDER — MECLIZINE HCL 25 MG PO TABS: 25.0000 mg | ORAL_TABLET | Freq: Three times a day (TID) | ORAL | 0 refills | Status: DC | PRN

## 2021-04-12 MED ORDER — PROCHLORPERAZINE EDISYLATE 10 MG/2ML IJ SOLN
10.0000 mg | Freq: Once | INTRAMUSCULAR | Status: AC
  Administered 2021-04-12: 10 mg via INTRAVENOUS
  Filled 2021-04-12: qty 2

## 2021-04-12 MED ORDER — ONDANSETRON HCL 4 MG/2ML IJ SOLN
4.0000 mg | Freq: Once | INTRAMUSCULAR | Status: AC
  Administered 2021-04-12: 4 mg via INTRAVENOUS
  Filled 2021-04-12: qty 2

## 2021-04-12 MED ORDER — ONDANSETRON 4 MG PO TBDP: 4.0000 mg | ORAL_TABLET | Freq: Three times a day (TID) | ORAL | 0 refills | Status: DC | PRN

## 2021-04-12 MED ORDER — MECLIZINE HCL 25 MG PO TABS
25.0000 mg | ORAL_TABLET | Freq: Once | ORAL | Status: DC
  Filled 2021-04-12: qty 1

## 2021-04-12 MED ORDER — SODIUM CHLORIDE 0.9 % IV BOLUS
1000.0000 mL | Freq: Once | INTRAVENOUS | Status: AC
  Administered 2021-04-12: 1000 mL via INTRAVENOUS

## 2021-04-12 NOTE — Discharge Instructions (Addendum)
Your Covid/Flu test was negative.  Your urine test does not show any signs of bladder infection.  Your MRI is normal as well.

## 2021-04-12 NOTE — ED Notes (Signed)
Pt was unable to tolerate PO challenge. PA aware.

## 2021-04-12 NOTE — ED Notes (Signed)
Pt is a LLE amputee, is not currently being treated for cancer. Pt state she uses a walker to ambulate.

## 2021-04-12 NOTE — ED Triage Notes (Signed)
Pt comes into the ED via EMS from home with N/V since 2am this morning,  Pt c/o waking up with dizziness then began having N/V.Marland Kitchen denies any pain  [id number] #[id number] Zofran 1m 166/82 92HR 96%RA

## 2021-04-12 NOTE — ED Provider Notes (Signed)
[facility] Emergency Department Provider Note  ____________________________________________  Time seen: Approximately 9:39 PM  I have reviewed the triage vital signs and the nursing notes.   HISTORY  Chief Complaint Emesis    HPI Misty Page is a 80 y.o. female with a history of colitis, GERD, hypertension, left AKA who comes ED complaining of waking up this morning at 2 AM with vertigo symptoms associated with nausea and multiple episodes of vomiting throughout the day.  She does not have a history of vertigo or migraines.  No recent falls or trauma.  No fever chills or neck pain or neck stiffness.  She felt like she was in her normal state of health when she went to bed last night.  Denies any vision change, paresthesias, motor weakness.  No change in her coordination.    Past Medical History:  Diagnosis Date  . Allergy   . Anemia   . Arthritis   . CA of skin 10/15/2014  . Calculus of kidney 10/15/2014   Seen in ER 06/16/10.   . Colitis   . Complication of anesthesia    nausea  . Complication of internal prosthetic device 01/27/2009  . Coronary artery disease   . Diverticulitis   . Environmental and seasonal allergies   . Fibrocystic breast disease (FCBD)   . GERD (gastroesophageal reflux disease)   . Heart murmur   . High cholesterol   . History of heart artery stent   . Hyperlipidemia   . Hypertension   . Hyperthyroidism   . Myocardial infarction ([place])   . Osteomyelitis of left femur ([place]) 11/30/2018  . Osteoporosis   . Reactive depression 02/25/2019  . Thyroid disease   . TIA (transient ischemic attack)      Patient Active Problem List   Diagnosis Date Noted  . Chronic anticoagulation 10/30/2020  . Change in bowel habits 05/01/2020  . Mesenteric ischemia ([place]) 02/13/2020  . Status post above-knee amputation of left lower extremity ([place]) 05/10/2019  . Memory changes 05/10/2019  . Infected orthopedic implant, initial encounter  ([place]) 03/07/2019  . Electrolyte abnormality 03/03/2019  . Reactive depression 02/25/2019  . Wound dehiscence, surgical 02/20/2019  . Hyponatremia 09/27/2018  . Acute blood loss anemia 09/26/2018  . Bilateral carotid artery disease ([place]) 06/13/2018  . 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 History of sarcoma 05/08/2018  . Innominate artery stenosis ([place]) 06/15/2017  . History of TIA (transient ischemic attack) 04/18/2017  . Bilateral carotid artery stenosis 02/07/2017  . Allergic rhinitis, seasonal 10/15/2014  . Cardiac murmur 10/15/2014  . History of colon polyps 10/15/2014  . Hyperthyroidism 10/15/2014  . Cannot sleep 10/15/2014  . Herpes zona 10/15/2014  . Ulcerative colitis ([place]) 10/15/2014  . Aortic heart valve narrowing 12/03/2013  . Arteriosclerosis of coronary artery 11/23/2013  . History of MI (myocardial infarction) 11/23/2013  . Peripheral vascular disease ([place]) 11/23/2013  . Diverticulitis of colon 07/21/2009  . Hemorrhoids without complication 78/46/9629  . Absolute anemia 10/29/2008  . Atherosclerotic heart disease 10/29/2008  . Essential (primary) hypertension 10/29/2008  . Acid reflux 10/29/2008  . Hypercholesteremia 10/29/2008  . OP (osteoporosis) 10/29/2008     Past Surgical History:  Procedure Laterality Date  . ABDOMINAL HYSTERECTOMY  2007  . BREAST ENHANCEMENT SURGERY  2004  . BREAST IMPLANT REMOVAL    . BREAST SURGERY  1984   Fibrocystic Disease  . CAROTID ENDARTERECTOMY Left 06/21/2014   Dr. Delana Meyer  . CAROTID ENDARTERECTOMY Right 08/02/2014   Dr. Delana Meyer  .  CAROTID PTA/STENT INTERVENTION N/A 06/15/2017   Procedure: CAROTID PTA/STENT INTERVENTION;  Surgeon: Katha Cabal, MD;  Location: [place] [facility];  Service: Cardiovascular;  Laterality: N/A;  . COLONOSCOPY WITH PROPOFOL N/A 07/14/2015   Procedure: COLONOSCOPY WITH PROPOFOL;  Surgeon: Hulen Luster, MD;  Location: [facility] ENDOSCOPY;  Service: Gastroenterology;  Laterality: N/A;  .  COLONOSCOPY WITH PROPOFOL N/A 02/08/2018   Procedure: COLONOSCOPY WITH PROPOFOL;  Surgeon: Toledo, Benay Pike, MD;  Location: [facility] ENDOSCOPY;  Service: Gastroenterology;  Laterality: N/A;  . CORONARY ANGIOPLASTY WITH STENT PLACEMENT  1999  . ESOPHAGOGASTRODUODENOSCOPY (EGD) WITH PROPOFOL N/A 02/08/2018   Procedure: ESOPHAGOGASTRODUODENOSCOPY (EGD) WITH PROPOFOL;  Surgeon: Toledo, Benay Pike, MD;  Location: [facility] ENDOSCOPY;  Service: Gastroenterology;  Laterality: N/A;  . FEMORAL ARTERY STENT  08/2003  . HERNIA REPAIR  2008  . LEG AMPUTATION AT HIP Left 02/28/2019  . MASTECTOMY    . RENAL ARTERY STENT  08/2003  . TRANSUMBILICAL AUGMENTATION MAMMAPLASTY    . VASCULAR SURGERY    . VISCERAL ARTERY INTERVENTION N/A 02/13/2020   Procedure: VISCERAL ARTERY INTERVENTION;  Surgeon: Algernon Huxley, MD;  Location: [place] [facility];  Service: Cardiovascular;  Laterality: N/A;     Prior to Admission medications   Medication Sig Start Date End Date Taking? Authorizing Provider  meclizine (ANTIVERT) 25 MG tablet Take 1 tablet (25 mg total) by mouth 3 (three) times daily as needed for dizziness or nausea. 04/12/21  Yes Carrie Mew, MD  ondansetron (ZOFRAN ODT) 4 MG disintegrating tablet Take 1 tablet (4 mg total) by mouth every 8 (eight) hours as needed for nausea or vomiting. 04/12/21  Yes Carrie Mew, MD  amLODipine (NORVASC) 5 MG tablet TAKE 1 TABLET BY MOUTH DAILY 11/17/20   Virginia Crews, MD  Ascorbic Acid (VITAMIN C) 1000 MG tablet Take 500 mg by mouth daily.     [provider]  aspirin EC 81 MG EC tablet Take 1 tablet (81 mg total) by mouth daily. Swallow whole. 02/16/20   Jennye Boroughs, MD  Calcium-Vitamin D-Vitamin K 500-100-40 MG-UNT-MCG CHEW Chew 1 tablet by mouth 2 (two) times daily. VIACTIV, 500-100-40 (Oral Tablet Chewable)  1 tablet twice a day for 0 days  Quantity: 0.00;  Refills: 0   Ordered :13-Apr-2010  Karoline Caldwell, Anastasiya ;  Started 29-October-2008 Active  Comments: DX: 733.00 10/29/08   [provider]  clopidogrel (PLAVIX) 75 MG tablet TAKE 1 TABLET BY MOUTH DAILY 11/03/20   [name], Dionne Bucy, MD  Cranberry-Cholecalciferol 4200-500 MG-UNIT CAPS Take by mouth.    [provider]  ezetimibe (ZETIA) 10 MG tablet TAKE 1 TABLET BY MOUTH DAILY 11/17/20   Virginia Crews, MD  famotidine (PEPCID) 20 MG tablet TAKE 1 TABLET BY MOUTH 2 TIMES DAILY 01/30/21   [name], Dionne Bucy, MD  ferrous sulfate 325 (65 FE) MG EC tablet Take 325 mg by mouth daily. FERROUS SULFATE, 325 (65 Fe)MG (Oral Tablet Delayed Release)  1 every day for 0 days  Quantity: 0.00;  Refills: 0   Ordered :13-Apr-2010  Celene Kras, MA, Anastasiya ;  Started 29-October-2008 Active Comments: DX: 285.9 10/29/08   [provider]  fexofenadine (ALLEGRA) 180 MG tablet Take 180 mg by mouth at bedtime.     [provider]  fluticasone (FLONASE) 50 MCG/ACT nasal spray USE 2 SPRAYS EACH NOSTRIL DAILY 10/22/19   Virginia Crews, MD  losartan (COZAAR) 50 MG tablet TAKE 1 TABLET BY MOUTH DAILY 11/03/20   Brita Romp Dionne Bucy, MD  Mesalamine 800 MG TBEC Take 1,600 mg by mouth 2 (two) times daily.  09/26/17   [provider]  methimazole (TAPAZOLE) 5 MG tablet Take 2.5 mg by mouth daily.  03/30/17   [provider]  Probiotic Product (PROBIOTIC DAILY PO) Take by mouth.    [provider]  rosuvastatin (CRESTOR) 10 MG tablet Take 1 tablet (10 mg total) by mouth daily. 04/04/20   Virginia Crews, MD  rosuvastatin (CRESTOR) 20 MG tablet TAKE ONE-HALF TABLET BY MOUTH DAILY 11/17/20   Virginia Crews, MD     Allergies Amoxicillin, Naproxen, Penicillins, Codeine, Levofloxacin, and Shellfish allergy   Family History  Problem Relation Age of Onset  . Hyperlipidemia Mother   . Congestive Heart Failure Mother   . COPD Mother   . Heart disease Mother   . Hypertension Mother   . Osteoporosis Mother   . Pancreatic cancer Father   .  Arthritis Sister   . Alzheimer's disease Sister   . Hypertension Sister   . Prostate cancer Brother   . Heart attack Brother   . Stomach cancer Brother   . Kidney failure Brother   . Leukemia Brother   . Emphysema Brother   . Pancreatic cancer Brother   . Stomach cancer Brother   . Breast cancer Paternal Grandmother   . Leukemia Paternal Grandfather   . Heart attack Maternal Uncle   . Stroke Maternal Aunt     Social History Social History   Tobacco Use  . Smoking status: Former    Types: Cigarettes    Quit date: 05/30/1994    Years since quitting: 26.8  . Smokeless tobacco: Never  Vaping Use  . Vaping Use: Never used  Substance Use Topics  . Alcohol use: Not Currently    Comment: rare- 1 glass of wine EOW  . Drug use: No    Review of Systems  Constitutional:   No fever or chills.  ENT:   No sore throat. No rhinorrhea. Cardiovascular:   No chest pain or syncope. Respiratory:   No dyspnea or cough. Gastrointestinal:   Negative for abdominal pain, vomiting and diarrhea.  Musculoskeletal:   Negative for focal pain or swelling All other systems reviewed and are negative except as documented above in ROS and HPI.  ____________________________________________   PHYSICAL EXAM:  VITAL SIGNS: ED Triage Vitals  Enc Vitals Group     BP 04/12/21 1441 (!) 165/63     Pulse Rate 04/12/21 1441 93     Resp 04/12/21 1441 18     Temp 04/12/21 1441 98.8 F (37.1 C)     Temp Source 04/12/21 1441 Oral     SpO2 04/12/21 1441 94 %     Weight 04/12/21 1442 126 lb (57.2 kg)     Height 04/12/21 1442 5' 1"  (1.549 m)     Head Circumference --      Peak Flow --      Pain Score 04/12/21 1442 0     Pain Loc --      Pain Edu? --      Excl. in [place]? --     Vital signs reviewed, nursing assessments reviewed.   Constitutional:   Alert and oriented. Non-toxic appearance. Eyes:   Conjunctivae are normal. EOMI. PERRL.  No nystagmus ENT      Head:   Normocephalic and atraumatic.       Nose:   Normal      Mouth/Throat:   Normal.  Neck:   No meningismus. Full ROM. Hematological/Lymphatic/Immunilogical:   No cervical lymphadenopathy. Cardiovascular:   RRR. Symmetric bilateral radial and DP pulses.  No murmurs. Cap refill less than 2 seconds. Respiratory:   Normal respiratory effort without tachypnea/retractions. Breath sounds are clear and equal bilaterally. No wheezes/rales/rhonchi. Gastrointestinal:   Soft with mild suprapubic tenderness. Non distended.  No rebound, rigidity, or guarding. Musculoskeletal:   Normal range of motion in all extremities. No joint effusions.  No lower extremity tenderness.  No edema. Neurologic:   Normal speech and language.  Motor grossly intact. No acute focal neurologic deficits are appreciated.  Skin:    Skin is warm, dry and intact. No rash noted.  No petechiae, purpura, or bullae.  ____________________________________________    LABS (pertinent positives/negatives) (all labs ordered are listed, but only abnormal results are displayed) Labs Reviewed  LIPASE, BLOOD - Abnormal; Notable for the following components:      Result Value   Lipase 56 (*)    All other components within normal limits  COMPREHENSIVE METABOLIC PANEL - Abnormal; Notable for the following components:   Glucose, Bld 133 (*)    All other components within normal limits  CBC - Abnormal; Notable for the following components:   WBC 11.3 (*)    All other components within normal limits  URINALYSIS, ROUTINE W REFLEX MICROSCOPIC - Abnormal; Notable for the following components:   Color, Urine YELLOW (*)    APPearance HAZY (*)    Hgb urine dipstick MODERATE (*)    Ketones, ur 20 (*)    Protein, ur 30 (*)    Leukocytes,Ua SMALL (*)    All other components within normal limits  RESP PANEL BY RT-PCR (FLU A&B, COVID) [id number]   ____________________________________________   EKG  Interpreted by me  Date: 04/12/2021  Rate: 89  Rhythm: normal sinus rhythm  QRS  Axis: normal  Intervals: normal  ST/T Wave abnormalities: normal  Conduction Disutrbances: none  Narrative Interpretation: unremarkable     ____________________________________________    RADIOLOGY  No results found.  ____________________________________________   PROCEDURES Procedures  ____________________________________________  DIFFERENTIAL DIAGNOSIS   Viral illness, UTI, electrolyte abnormality, acute stroke, peripheral vertigo, dehydration  CLINICAL IMPRESSION / ASSESSMENT AND PLAN / ED COURSE  Medications ordered in the ED: Medications  sodium chloride 0.9 % bolus 1,000 mL (1,000 mLs Intravenous New Bag/Given 04/12/21 2203)  ondansetron (ZOFRAN) injection 4 mg (4 mg Intravenous Given 04/12/21 2203)    Pertinent labs & imaging results that were available during my care of the patient were reviewed by me and considered in my medical decision making (see chart for details).  Misty Page was evaluated in Emergency Department on 04/12/2021 for the symptoms described in the history of present illness. She was evaluated in the context of the global COVID-19 pandemic, which necessitated consideration that the patient might be at risk for infection with the SARS-CoV-2 virus that causes COVID-19. Institutional protocols and algorithms that pertain to the evaluation of patients at risk for COVID-19 are in a state of rapid change based on information released by regulatory bodies including the CDC and federal and state organizations. These policies and algorithms were followed during the patient's care in the ED.   Patient presents with vertiginous symptoms with persistent nausea vomiting throughout the day.  She appears dehydrated and I will give IV fluids.  Will obtain MRI brain to evaluate for stroke.  Check viral swab.  Serum labs are normal.   ----------------------------------------- 10:36 PM on 04/12/2021 -----------------------------------------  COVID and flu  negative.  Urinalysis not consistent with UTI but does show some signs of dehydration.  Patient has received fluids in the ED.  Awaiting MRI report, if negative patient can be discharged home.  Clinical Course as of 04/12/21 2310  Sun Apr 12, 2021  2310 Mri normal.  [PS]    Clinical Course User Index [PS] Carrie Mew, MD     ____________________________________________   FINAL CLINICAL IMPRESSION(S) / ED DIAGNOSES    Final diagnoses:  Vertigo  Nausea and vomiting, unspecified vomiting type  Dehydration     ED Discharge Orders          Ordered    meclizine (ANTIVERT) 25 MG tablet  3 times daily PRN        04/12/21 2235    ondansetron (ZOFRAN ODT) 4 MG disintegrating tablet  Every 8 hours PRN        04/12/21 2235            Portions of this note were generated with dragon dictation software. Dictation errors may occur despite best attempts at proofreading.    Carrie Mew, MD 04/12/21 2237

## 2021-04-13 DIAGNOSIS — I10 Essential (primary) hypertension: Secondary | ICD-10-CM

## 2021-04-13 DIAGNOSIS — Z886 Allergy status to analgesic agent status: Secondary | ICD-10-CM | POA: Diagnosis not present

## 2021-04-13 DIAGNOSIS — Z8349 Family history of other endocrine, nutritional and metabolic diseases: Secondary | ICD-10-CM | POA: Diagnosis not present

## 2021-04-13 DIAGNOSIS — Z881 Allergy status to other antibiotic agents status: Secondary | ICD-10-CM | POA: Diagnosis not present

## 2021-04-13 DIAGNOSIS — Z95820 Peripheral vascular angioplasty status with implants and grafts: Secondary | ICD-10-CM | POA: Diagnosis not present

## 2021-04-13 DIAGNOSIS — Z885 Allergy status to narcotic agent status: Secondary | ICD-10-CM | POA: Diagnosis not present

## 2021-04-13 DIAGNOSIS — R42 Dizziness and giddiness: Secondary | ICD-10-CM | POA: Diagnosis present

## 2021-04-13 DIAGNOSIS — Z20822 Contact with and (suspected) exposure to covid-19: Secondary | ICD-10-CM | POA: Diagnosis present

## 2021-04-13 DIAGNOSIS — Z8261 Family history of arthritis: Secondary | ICD-10-CM | POA: Diagnosis not present

## 2021-04-13 DIAGNOSIS — R1110 Vomiting, unspecified: Secondary | ICD-10-CM | POA: Diagnosis not present

## 2021-04-13 DIAGNOSIS — I2510 Atherosclerotic heart disease of native coronary artery without angina pectoris: Secondary | ICD-10-CM | POA: Diagnosis present

## 2021-04-13 DIAGNOSIS — E039 Hypothyroidism, unspecified: Secondary | ICD-10-CM | POA: Diagnosis present

## 2021-04-13 DIAGNOSIS — I70202 Unspecified atherosclerosis of native arteries of extremities, left leg: Secondary | ICD-10-CM | POA: Diagnosis present

## 2021-04-13 DIAGNOSIS — Z8249 Family history of ischemic heart disease and other diseases of the circulatory system: Secondary | ICD-10-CM | POA: Diagnosis not present

## 2021-04-13 DIAGNOSIS — R112 Nausea with vomiting, unspecified: Secondary | ICD-10-CM

## 2021-04-13 DIAGNOSIS — Z8673 Personal history of transient ischemic attack (TIA), and cerebral infarction without residual deficits: Secondary | ICD-10-CM | POA: Diagnosis not present

## 2021-04-13 DIAGNOSIS — R809 Proteinuria, unspecified: Secondary | ICD-10-CM | POA: Diagnosis present

## 2021-04-13 DIAGNOSIS — E860 Dehydration: Secondary | ICD-10-CM | POA: Diagnosis present

## 2021-04-13 DIAGNOSIS — I252 Old myocardial infarction: Secondary | ICD-10-CM | POA: Diagnosis not present

## 2021-04-13 DIAGNOSIS — K219 Gastro-esophageal reflux disease without esophagitis: Secondary | ICD-10-CM | POA: Diagnosis present

## 2021-04-13 DIAGNOSIS — Z89612 Acquired absence of left leg above knee: Secondary | ICD-10-CM | POA: Diagnosis not present

## 2021-04-13 DIAGNOSIS — Z85828 Personal history of other malignant neoplasm of skin: Secondary | ICD-10-CM | POA: Diagnosis not present

## 2021-04-13 DIAGNOSIS — R824 Acetonuria: Secondary | ICD-10-CM | POA: Diagnosis present

## 2021-04-13 DIAGNOSIS — E0590 Thyrotoxicosis, unspecified without thyrotoxic crisis or storm: Secondary | ICD-10-CM | POA: Diagnosis present

## 2021-04-13 DIAGNOSIS — Z880 Allergy status to penicillin: Secondary | ICD-10-CM | POA: Diagnosis not present

## 2021-04-13 MED ORDER — HYDRALAZINE HCL 20 MG/ML IJ SOLN: 10.0000 mg | INTRAMUSCULAR | Status: DC | PRN

## 2021-04-13 MED ORDER — MECLIZINE HCL 25 MG PO TABS
25.0000 mg | ORAL_TABLET | Freq: Three times a day (TID) | ORAL | Status: DC
  Administered 2021-04-13 – 2021-04-14 (×4): 25 mg via ORAL
  Filled 2021-04-13 (×5): qty 1

## 2021-04-13 MED ORDER — AMLODIPINE BESYLATE 5 MG PO TABS
5.0000 mg | ORAL_TABLET | Freq: Every day | ORAL | Status: DC
  Administered 2021-04-14: 5 mg via ORAL
  Filled 2021-04-13 (×2): qty 1

## 2021-04-13 MED ORDER — PANTOPRAZOLE SODIUM 40 MG PO TBEC
40.0000 mg | DELAYED_RELEASE_TABLET | Freq: Every day | ORAL | Status: DC
  Administered 2021-04-14: 40 mg via ORAL
  Filled 2021-04-13 (×2): qty 1

## 2021-04-13 MED ORDER — SODIUM CHLORIDE 0.45 % IV SOLN: INTRAVENOUS | Status: DC

## 2021-04-13 MED ORDER — MECLIZINE HCL 25 MG PO TABS
25.0000 mg | ORAL_TABLET | Freq: Three times a day (TID) | ORAL | Status: DC
  Filled 2021-04-13 (×2): qty 1

## 2021-04-13 MED ORDER — ONDANSETRON HCL 4 MG/2ML IJ SOLN
4.0000 mg | Freq: Four times a day (QID) | INTRAMUSCULAR | Status: DC | PRN
  Administered 2021-04-13 – 2021-04-14 (×3): 4 mg via INTRAVENOUS
  Filled 2021-04-13 (×3): qty 2

## 2021-04-13 NOTE — ED Notes (Signed)
Pt up to bedside commode

## 2021-04-13 NOTE — Progress Notes (Signed)
Patient ID: Misty Page, female   DOB: 07-Nov-1940, 80 y.o.   MRN: [id number] This is a no charge note as patient has been admitted this AM.  Patient seen and examined H&P reviewed  Misty Page is a 80 y.o. female with medical history significant for  CAD, PAD s/p left AKA, HTN, TIA, hypothyroidism, history of stent placement SMA 01/2020 presenting with sudden onset dizziness followed by intractable nausea and vomiting.  This a.m. still nauseous unable to take any p.o. intake.  Still very dizzy with her move head movement while lying in bed.   Cta no w/r Regular s1/s2  Soft benign  No edema   A//P Will treat for vertigo with Meclizine Start ivf for hydration as has decrease po intake Antiemetic

## 2021-04-13 NOTE — ED Provider Notes (Signed)
2:19 AM reevaluated patient she is unable to tolerate p.o.  Patient is requesting admission due to inability tolerate p.o., continued dizziness.  Abdomen soft nontender   Vanessa Palmyra, MD 04/13/21 [phone]

## 2021-04-13 NOTE — H&P (Signed)
History and Physical    Misty Page BTY:[id number] DOB: 1940/12/31 DOA: 04/12/2021  PCP: Virginia Crews, MD   Patient coming from: [place]  I have personally briefly reviewed patient's relevant medical records in [place]  Chief Complaint: Dizziness, nausea and vomiting  HPI: Misty Page is a 80 y.o. female with medical history significant for  CAD, PAD s/p left AKA, HTN, TIA, hypothyroidism, history of stent placement SMA 01/2020 presenting with sudden onset dizziness followed by intractable nausea and vomiting.  She denies headache, visual disturbance, one-sided numbness weakness or tingling in the face or extremities.  She fever or chills, abdominal pain or diarrhea.  Denies chest pain or shortness of breath.  ED course: On arrival BP elevated at 165/63 with otherwise normal Blood work with mild leukocytosis of 11.3 and lipase 56.  Urinalysis with mild ketonuria and proteinuria  EKG, personally viewed and interpreted: NSR at 89 with no acute ST-T wave changes  Imaging: MRI brain with no acute intracranial abnormalities  Patient was treated with IV hydration and IV antiemetics in the ED but failed a p.o. challenge.  Hospitalist consulted for admission.  Review of Systems: As per HPI otherwise all other systems on review of systems negative.    Past Medical History:  Diagnosis Date   Allergy    Anemia    Arthritis    CA of skin 10/15/2014   Calculus of kidney 10/15/2014   Seen in ER 06/16/10.    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   Coronary artery disease    Diverticulitis    Environmental and seasonal allergies    Fibrocystic breast disease (FCBD)    GERD (gastroesophageal reflux disease)    Heart murmur    High cholesterol    History of heart artery stent    Hyperlipidemia    Hypertension    Hyperthyroidism    Myocardial infarction ([place])    Osteomyelitis of left femur ([place])  11/30/2018   Osteoporosis    Reactive depression 02/25/2019   Thyroid disease    TIA (transient ischemic attack)     Past Surgical History:  Procedure Laterality Date   ABDOMINAL HYSTERECTOMY  2007   BREAST ENHANCEMENT SURGERY  2004   BREAST IMPLANT REMOVAL     BREAST SURGERY  1984   Fibrocystic Disease   CAROTID ENDARTERECTOMY Left 06/21/2014   Dr. Delana Meyer   CAROTID ENDARTERECTOMY Right 08/02/2014   Dr. Delana Meyer   CAROTID PTA/STENT INTERVENTION N/A 06/15/2017   Procedure: CAROTID PTA/STENT INTERVENTION;  Surgeon: Katha Cabal, MD;  Location: [place] [place] [facility];  Service: Cardiovascular;  Laterality: N/A;   COLONOSCOPY WITH PROPOFOL N/A 07/14/2015   Procedure: COLONOSCOPY WITH PROPOFOL;  Surgeon: Hulen Luster, MD;  Location: [facility] ENDOSCOPY;  Service: Gastroenterology;  Laterality: N/A;   COLONOSCOPY WITH PROPOFOL N/A 02/08/2018   Procedure: COLONOSCOPY WITH PROPOFOL;  Surgeon: Toledo, Benay Pike, MD;  Location: [facility] ENDOSCOPY;  Service: Gastroenterology;  Laterality: N/A;   CORONARY ANGIOPLASTY WITH STENT PLACEMENT  1999   ESOPHAGOGASTRODUODENOSCOPY (EGD) WITH PROPOFOL N/A 02/08/2018   Procedure: ESOPHAGOGASTRODUODENOSCOPY (EGD) WITH PROPOFOL;  Surgeon: Toledo, Benay Pike, MD;  Location: [facility] ENDOSCOPY;  Service: Gastroenterology;  Laterality: N/A;   FEMORAL ARTERY STENT  08/2003   HERNIA REPAIR  2008   LEG AMPUTATION AT HIP Left 02/28/2019   MASTECTOMY     RENAL ARTERY STENT  [id number]   TRANSUMBILICAL AUGMENTATION MAMMAPLASTY  VASCULAR SURGERY     VISCERAL ARTERY INTERVENTION N/A 02/13/2020   Procedure: VISCERAL ARTERY INTERVENTION;  Surgeon: Algernon Huxley, MD;  Location: [place] [facility];  Service: Cardiovascular;  Laterality: N/A;     reports that she quit smoking about 26 years ago. Her smoking use included cigarettes. She has never used smokeless tobacco. She reports that she does not currently use alcohol. She reports that she does not use drugs.  Allergies  Allergen  Reactions   Amoxicillin Anaphylaxis   Naproxen Hives, Shortness Of Breath, Nausea And Vomiting and Swelling   Penicillins Shortness Of Breath, Swelling and Rash    Has patient had a PCN reaction causing immediate rash, facial/tongue/throat swelling, SOB or lightheadedness with hypotension: Yes Has patient had a PCN reaction causing severe rash involving mucus membranes or skin necrosis: Yes Has patient had a PCN reaction that required hospitalization: Yes Has patient had a PCN reaction occurring within the last 10 years: No  If all of the above answers are "NO", then may proceed with Cephalosporin use.    Codeine Nausea And Vomiting   Levofloxacin Nausea And Vomiting   Shellfish Allergy Hives, Diarrhea, Nausea And Vomiting and Swelling    Family History  Problem Relation Age of Onset   Hyperlipidemia Mother    Congestive Heart Failure Mother    COPD Mother    Heart disease Mother    Hypertension Mother    Osteoporosis Mother    Pancreatic cancer Father    Arthritis Sister    Alzheimer's disease Sister    Hypertension Sister    Prostate cancer Brother    Heart attack Brother    Stomach cancer Brother    Kidney failure Brother    Leukemia Brother    Emphysema Brother    Pancreatic cancer Brother    Stomach cancer Brother    Breast cancer Paternal Grandmother    Leukemia Paternal Grandfather    Heart attack Maternal Uncle    Stroke Maternal Aunt       Prior to Admission medications   Medication Sig Start Date End Date Taking? Authorizing Provider  meclizine (ANTIVERT) 25 MG tablet Take 1 tablet (25 mg total) by mouth 3 (three) times daily as needed for dizziness or nausea. 04/12/21  Yes Carrie Mew, MD  ondansetron (ZOFRAN ODT) 4 MG disintegrating tablet Take 1 tablet (4 mg total) by mouth every 8 (eight) hours as needed for nausea or vomiting. 04/12/21  Yes Carrie Mew, MD  amLODipine (NORVASC) 5 MG tablet TAKE 1 TABLET BY MOUTH DAILY 11/17/20   Virginia Crews, MD  Ascorbic Acid (VITAMIN C) 1000 MG tablet Take 500 mg by mouth daily.     [provider]  aspirin EC 81 MG EC tablet Take 1 tablet (81 mg total) by mouth daily. Swallow whole. 02/16/20   Jennye Boroughs, MD  Calcium-Vitamin D-Vitamin K 500-100-40 MG-UNT-MCG CHEW Chew 1 tablet by mouth 2 (two) times daily. VIACTIV, 500-100-40 (Oral Tablet Chewable)  1 tablet twice a day for 0 days  Quantity: 0.00;  Refills: 0   Ordered :13-Apr-2010  Karoline Caldwell, Anastasiya ;  Started 29-October-2008 Active Comments: DX: 733.00 10/29/08   [provider]  clopidogrel (PLAVIX) 75 MG tablet TAKE 1 TABLET BY MOUTH DAILY 11/03/20   [name], Dionne Bucy, MD  Cranberry-Cholecalciferol 4200-500 MG-UNIT CAPS Take by mouth.    [provider]  ezetimibe (ZETIA) 10 MG tablet TAKE 1 TABLET BY MOUTH DAILY 11/17/20   Brita Romp, Dionne Bucy, MD  famotidine (PEPCID) 20 MG tablet TAKE 1 TABLET BY MOUTH 2 TIMES DAILY 01/30/21   [name], Dionne Bucy, MD  ferrous sulfate 325 (65 FE) MG EC tablet Take 325 mg by mouth daily. FERROUS SULFATE, 325 (65 Fe)MG (Oral Tablet Delayed Release)  1 every day for 0 days  Quantity: 0.00;  Refills: 0   Ordered :13-Apr-2010  Celene Kras, MA, Anastasiya ;  Started 29-October-2008 Active Comments: DX: 285.9 10/29/08   [provider]  fexofenadine (ALLEGRA) 180 MG tablet Take 180 mg by mouth at bedtime.     [provider]  fluticasone (FLONASE) 50 MCG/ACT nasal spray USE 2 SPRAYS EACH NOSTRIL DAILY 10/22/19   Virginia Crews, MD  losartan (COZAAR) 50 MG tablet TAKE 1 TABLET BY MOUTH DAILY 11/03/20   Virginia Crews, MD  Mesalamine 800 MG TBEC Take 1,600 mg by mouth 2 (two) times daily.  09/26/17   [provider]  methimazole (TAPAZOLE) 5 MG tablet Take 2.5 mg by mouth daily.  03/30/17   [provider]  Probiotic Product (PROBIOTIC DAILY PO) Take by mouth.    [provider]  rosuvastatin (CRESTOR) 10 MG tablet Take 1 tablet (10  mg total) by mouth daily. 04/04/20   Virginia Crews, MD  rosuvastatin (CRESTOR) 20 MG tablet TAKE ONE-HALF TABLET BY MOUTH DAILY 11/17/20   Virginia Crews, MD    Physical Exam: Vitals:   04/13/21 0000 04/13/21 0030 04/13/21 0129 04/13/21 0252  BP: (!) 146/70 117/61 (!) 153/78 (!) 164/75  Pulse:   85 79  Resp:   20 16  Temp:      TempSrc:      SpO2:   94% 93%  Weight:      Height:       Constitutional: Alert and oriented x 3 . Not in any apparent distress HEENT:      Head: Normocephalic and atraumatic.         Eyes: PERLA, EOMI, Conjunctivae are normal. Sclera is non-icteric.       Mouth/Throat: Mucous membranes are moist.       Neck: Supple with no signs of meningismus. Cardiovascular: Regular rate and rhythm. No murmurs, gallops, or rubs. 2+ symmetrical distal pulses are present . No JVD. No  LE edema Respiratory: Respiratory effort normal .Lungs sounds clear bilaterally. No wheezes, crackles, or rhonchi.  Gastrointestinal: Soft, non tender, non distended. Positive bowel sounds.  Genitourinary: No CVA tenderness. Musculoskeletal: Nontender with normal range of motion in all extremities. No cyanosis, or erythema of extremities. Neurologic:  Face is symmetric. Moving all extremities. No gross focal neurologic deficits . Skin: Skin is warm, dry.  No rash or ulcers Psychiatric: Mood and affect are appropriate    Labs on Admission: I have personally reviewed following labs and imaging studies  CBC: Recent Labs  Lab 04/12/21 1517  WBC 11.3*  HGB 15.0  HCT 43.9  MCV 93.2  PLT 213   Basic Metabolic Panel: Recent Labs  Lab 04/12/21 1517  NA 137  K 3.8  CL 101  CO2 25  GLUCOSE 133*  BUN 18  CREATININE 0.45  CALCIUM 9.2   GFR: Estimated Creatinine Clearance: 42.3 mL/min (by C-G formula based on SCr of 0.45 mg/dL). Liver Function Tests: Recent Labs  Lab 04/12/21 1517  AST 22  ALT 18  ALKPHOS 64  BILITOT 0.8  PROT 7.3  ALBUMIN 4.2   Recent Labs   Lab 04/12/21 1517  LIPASE 56*   No results for input(s): AMMONIA  in the last 168 hours. Coagulation Profile: No results for input(s): INR, PROTIME in the last 168 hours. Cardiac Enzymes: No results for input(s): CKTOTAL, CKMB, CKMBINDEX, TROPONINI in the last 168 hours. BNP (last 3 results) No results for input(s): PROBNP in the last 8760 hours. HbA1C: No results for input(s): HGBA1C in the last 72 hours. CBG: No results for input(s): GLUCAP in the last 168 hours. Lipid Profile: No results for input(s): CHOL, HDL, LDLCALC, TRIG, CHOLHDL, LDLDIRECT in the last 72 hours. Thyroid Function Tests: No results for input(s): TSH, T4TOTAL, FREET4, T3FREE, THYROIDAB in the last 72 hours. Anemia Panel: No results for input(s): VITAMINB12, FOLATE, FERRITIN, TIBC, IRON, RETICCTPCT in the last 72 hours. Urine analysis:    Component Value Date/Time   COLORURINE YELLOW (A) 04/12/2021 2107   APPEARANCEUR HAZY (A) 04/12/2021 2107   APPEARANCEUR Clear 05/22/2014 2223   LABSPEC 1.016 04/12/2021 2107   LABSPEC 1.024 05/22/2014 2223   PHURINE 6.0 04/12/2021 2107   GLUCOSEU NEGATIVE 04/12/2021 2107   GLUCOSEU Negative 05/22/2014 2223   HGBUR MODERATE (A) 04/12/2021 2107   BILIRUBINUR NEGATIVE 04/12/2021 2107   BILIRUBINUR Negative 07/06/2019 1154   BILIRUBINUR Negative 05/22/2014 2223   KETONESUR 20 (A) 04/12/2021 2107   PROTEINUR 30 (A) 04/12/2021 2107   UROBILINOGEN 0.2 07/06/2019 1154   NITRITE NEGATIVE 04/12/2021 2107   LEUKOCYTESUR SMALL (A) 04/12/2021 2107   LEUKOCYTESUR Trace 05/22/2014 2223    Radiological Exams on Admission: MR BRAIN WO CONTRAST  Result Date: 04/12/2021 CLINICAL DATA:  Initial evaluation for acute dizziness. EXAM: MRI HEAD WITHOUT CONTRAST TECHNIQUE: Multiplanar, multiecho pulse sequences of the brain and surrounding structures were obtained without intravenous contrast. COMPARISON:  CT from 05/17/2019. FINDINGS: Brain: Cerebral volume within normal limits for  age. Patchy and confluent T2/FLAIR hyperintensity involving the periventricular deep white matter both cerebral hemispheres as well as the pons, most consistent with chronic small vessel ischemic disease. No evidence for acute or subacute infarct. Gray-white matter differentiation maintained. No areas of remote cortical infarction. No acute intracranial hemorrhage. Few punctate chronic micro hemorrhages noted within the left occipital region. No mass lesion, midline shift or mass effect no hydrocephalus or extra-axial fluid collection. Pituitary gland suprasellar region normal. Midline structures intact. Vascular: Major intracranial vascular flow voids are maintained. Skull and upper cervical spine: Craniocervical junction within normal limits. Bone marrow signal intensity normal. No scalp soft tissue abnormality. Sinuses/Orbits: Patient status post bilateral ocular lens replacement. Globes orbital soft tissues demonstrate no acute finding. Paranasal sinuses are largely clear. No significant mastoid effusion. Inner ear structures grossly normal. Other: None. IMPRESSION: 1. No acute intracranial abnormality. 2. Moderate chronic microvascular ischemic disease. Electronically Signed   By: Jeannine Boga M.D.   On: 04/12/2021 23:01    Assessment/Plan    Intractable vomiting with nausea   Vertigo,peripheral -MRI negative for stroke -IV hydration - IV antiemetics    Essential (primary) hypertension - Continue amlodipine, losartan  Hyperthyroidism - Continue methimazole    History of TIA (transient ischemic attack) - Continue aspirin, rosuvastatin  PAD s/p left AKA - Assistance with transfers    DVT prophylaxis: Lovenox  Code Status: full code  Family Communication:  none  Disposition Plan: Back to previous home environment Consults called: none  Status: Observation   Athena Masse MD [facility]   04/13/2021, 2:58 AM

## 2021-04-13 NOTE — Progress Notes (Signed)
Called for transport [phone]

## 2021-04-14 ENCOUNTER — Telehealth: Payer: Self-pay

## 2021-04-14 DIAGNOSIS — Z8673 Personal history of transient ischemic attack (TIA), and cerebral infarction without residual deficits: Secondary | ICD-10-CM

## 2021-04-14 DIAGNOSIS — E0590 Thyrotoxicosis, unspecified without thyrotoxic crisis or storm: Secondary | ICD-10-CM

## 2021-04-14 DIAGNOSIS — R42 Dizziness and giddiness: Principal | ICD-10-CM

## 2021-04-14 DIAGNOSIS — R1110 Vomiting, unspecified: Secondary | ICD-10-CM

## 2021-04-14 LAB — CBC
HCT: 41 % (ref 36.0–46.0)
Hemoglobin: 13.9 g/dL (ref 12.0–15.0)
MCH: 32 pg (ref 26.0–34.0)
MCHC: 33.9 g/dL (ref 30.0–36.0)
MCV: 94.3 fL (ref 80.0–100.0)
Platelets: 294 10*3/uL (ref 150–400)
RBC: 4.35 MIL/uL (ref 3.87–5.11)
RDW: 13.8 % (ref 11.5–15.5)
WBC: 9.1 10*3/uL (ref 4.0–10.5)
nRBC: 0 % (ref 0.0–0.2)

## 2021-04-14 LAB — CREATININE, SERUM
Creatinine, Ser: 0.49 mg/dL (ref 0.44–1.00)
GFR, Estimated: >60 mL/min (ref >60)

## 2021-04-14 MED ORDER — ENOXAPARIN SODIUM 40 MG/0.4ML IJ SOSY
40.0000 mg | PREFILLED_SYRINGE | INTRAMUSCULAR | Status: DC
  Administered 2021-04-14: 40 mg via SUBCUTANEOUS
  Filled 2021-04-14: qty 0.4

## 2021-04-14 MED ORDER — PROMETHAZINE HCL 6.25 MG/5ML PO SYRP: 6.2500 mg | ORAL_SOLUTION | Freq: Three times a day (TID) | ORAL | 0 refills | Status: DC | PRN

## 2021-04-14 MED ORDER — MECLIZINE HCL 25 MG PO TABS: 25.0000 mg | ORAL_TABLET | Freq: Three times a day (TID) | ORAL | 0 refills | Status: DC

## 2021-04-14 NOTE — Telephone Encounter (Signed)
Copied from [place] [phone]. Topic: Appointment Scheduling - Scheduling Inquiry for Clinic >> Apr 14, 2021 12:06 PM Erick Blinks wrote: Reason for CRM: Inez Catalina called from [facility] to schedule the patient's hosp fu. She declined next available appts and says the pt needs to be worked in within the next two days. Please advise

## 2021-04-14 NOTE — Discharge Summary (Signed)
Misty Page UGQ:[id number] DOB: [birth year] DOA: 04/12/2021  PCP: Virginia Crews, MD  Admit date: 04/12/2021 Discharge date: 04/14/2021  Admitted From: home Disposition:  home  Recommendations for Outpatient Follow-up:  Follow up with PCP in 1 week Please obtain BMP/CBC in one week     Discharge Condition:Stable CODE STATUS:full  Diet recommendation: Heart Healthy  Brief/Interim Summary: Per UEK:CMKLK Misty Page is a 80 y.o. female with medical history significant for  CAD, PAD s/p left AKA, HTN, TIA, hypothyroidism, history of stent placement SMA 01/2020 presenting with sudden onset dizziness followed by intractable nausea and vomiting.  Patient was admitted and treated for vertigo. MRI brain with no acute intracranial abnormalities She did require IV fluids as she had decreased p.o. intake.  She was started on meclizine.  This a.m. she was able to tolerate her breakfast.  Her dizziness has improved some.  She feels that she is able to tolerate p.o. intake and to go home.  Patient is stable for discharge.   Discharge Diagnoses:  Active Problems:   Essential (primary) hypertension   Hyperthyroidism   History of TIA (transient ischemic attack)   Intractable vomiting with nausea   Vertigo   Intractable vomiting    Discharge Instructions  Discharge Instructions     Call MD for:  persistant nausea and vomiting   Complete by: As directed    Diet - low sodium heart healthy   Complete by: As directed    Increase activity slowly   Complete by: As directed       Allergies as of 04/14/2021       Reactions   Amoxicillin Anaphylaxis   Naproxen Hives, Shortness Of Breath, Nausea And Vomiting, Swelling   Penicillins Shortness Of Breath, Swelling, Rash   Has patient had a PCN reaction causing immediate rash, facial/tongue/throat swelling, SOB or lightheadedness with hypotension: Yes Has patient had a PCN reaction causing severe rash involving mucus membranes or skin  necrosis: Yes Has patient had a PCN reaction that required hospitalization: Yes Has patient had a PCN reaction occurring within the last 10 years: No  If all of the above answers are "NO", then may proceed with Cephalosporin use.   Codeine Nausea And Vomiting   Levofloxacin Nausea And Vomiting   Shellfish Allergy Hives, Diarrhea, Nausea And Vomiting, Swelling        Medication List     TAKE these medications    amLODipine 5 MG tablet Commonly known as: NORVASC TAKE 1 TABLET BY MOUTH DAILY   aspirin 81 MG EC tablet Take 1 tablet (81 mg total) by mouth daily. Swallow whole.   Calcium-Vitamin D-Vitamin K 500-100-40 MG-UNT-MCG Chew Chew 1 tablet by mouth 2 (two) times daily. VIACTIV, 500-100-40 (Oral Tablet Chewable)  1 tablet twice a day for 0 days  Quantity: 0.00;  Refills: 0   Ordered :13-Apr-2010  Misty Page ;  Started 29-October-2008 Active Comments: DX: 733.00   clopidogrel 75 MG tablet Commonly known as: PLAVIX TAKE 1 TABLET BY MOUTH DAILY   Cranberry-Cholecalciferol 4200-500 MG-UNIT Caps Take by mouth.   ezetimibe 10 MG tablet Commonly known as: ZETIA TAKE 1 TABLET BY MOUTH DAILY   famotidine 20 MG tablet Commonly known as: PEPCID TAKE 1 TABLET BY MOUTH 2 TIMES DAILY   ferrous sulfate 325 (65 FE) MG EC tablet Take 325 mg by mouth daily.   fexofenadine 180 MG tablet Commonly known as: ALLEGRA Take 180 mg by mouth at bedtime as needed.   fluticasone 50 MCG/ACT  nasal spray Commonly known as: FLONASE USE 2 SPRAYS EACH NOSTRIL DAILY What changed:  how much to take how to take this when to take this reasons to take this additional instructions   losartan 50 MG tablet Commonly known as: COZAAR TAKE 1 TABLET BY MOUTH DAILY   meclizine 25 MG tablet Commonly known as: ANTIVERT Take 1 tablet (25 mg total) by mouth 3 (three) times daily as needed for dizziness or nausea.   meclizine 25 MG tablet Commonly known as: ANTIVERT Take 1 tablet (25 mg  total) by mouth 3 (three) times daily.   Mesalamine 800 MG Tbec Take 800 mg by mouth 2 (two) times daily.   methimazole 5 MG tablet Commonly known as: TAPAZOLE Take 2.5 mg by mouth daily.   ondansetron 4 MG disintegrating tablet Commonly known as: Zofran ODT Take 1 tablet (4 mg total) by mouth every 8 (eight) hours as needed for nausea or vomiting.   PROBIOTIC DAILY PO Take 1 capsule by mouth daily.   rosuvastatin 10 MG tablet Commonly known as: CRESTOR Take 1 tablet (10 mg total) by mouth daily. What changed: Another medication with the same name was removed. Continue taking this medication, and follow the directions you see here.   vitamin C 1000 MG tablet Take 1,000 mg by mouth daily.        Follow-up Information     [name], Dionne Bucy, MD In 2 days.   Specialty: Family Medicine Contact information: [address] [place] [address] [phone]         [place] .   Specialty: Emergency Medicine Why: If symptoms worsen Contact information: [place] [id number] [place] [address] [phone]               Allergies  Allergen Reactions   Amoxicillin Anaphylaxis   Naproxen Hives, Shortness Of Breath, Nausea And Vomiting and Swelling   Penicillins Shortness Of Breath, Swelling and Rash    Has patient had a PCN reaction causing immediate rash, facial/tongue/throat swelling, SOB or lightheadedness with hypotension: Yes Has patient had a PCN reaction causing severe rash involving mucus membranes or skin necrosis: Yes Has patient had a PCN reaction that required hospitalization: Yes Has patient had a PCN reaction occurring within the last 10 years: No  If all of the above answers are "NO", then may proceed with Cephalosporin use.    Codeine Nausea And Vomiting   Levofloxacin Nausea And Vomiting   Shellfish Allergy Hives, Diarrhea, Nausea And Vomiting and Swelling     Consultations:    Procedures/Studies: MR BRAIN WO CONTRAST  Result Date: 04/12/2021 CLINICAL DATA:  Initial evaluation for acute dizziness. EXAM: MRI HEAD WITHOUT CONTRAST TECHNIQUE: Multiplanar, multiecho pulse sequences of the brain and surrounding structures were obtained without intravenous contrast. COMPARISON:  CT from 05/17/2019. FINDINGS: Brain: Cerebral volume within normal limits for age. Patchy and confluent T2/FLAIR hyperintensity involving the periventricular deep white matter both cerebral hemispheres as well as the pons, most consistent with chronic small vessel ischemic disease. No evidence for acute or subacute infarct. Gray-white matter differentiation maintained. No areas of remote cortical infarction. No acute intracranial hemorrhage. Few punctate chronic micro hemorrhages noted within the left occipital region. No mass lesion, midline shift or mass effect no hydrocephalus or extra-axial fluid collection. Pituitary gland suprasellar region normal. Midline structures intact. Vascular: Major intracranial vascular flow voids are maintained. Skull and upper cervical spine: Craniocervical junction within normal limits. Bone marrow signal  intensity normal. No scalp soft tissue abnormality. Sinuses/Orbits: Patient status post bilateral ocular lens replacement. Globes orbital soft tissues demonstrate no acute finding. Paranasal sinuses are largely clear. No significant mastoid effusion. Inner ear structures grossly normal. Other: None. IMPRESSION: 1. No acute intracranial abnormality. 2. Moderate chronic microvascular ischemic disease. Electronically Signed   By: Jeannine Boga M.D.   On: 04/12/2021 23:01      Subjective: Tolerated breakfast this AM.  Feeling better.  Less dizzy and nauseous.  Discharge Exam: Vitals:   04/14/21 0535 04/14/21 0756  BP: (!) 156/71 (!) 152/69  Pulse: 71 71  Resp: 20 18  Temp: 98.4 F (36.9 C) 99.1 F (37.3 C)  SpO2: 94% 95%   Vitals:    04/13/21 1510 04/13/21 1938 04/14/21 0535 04/14/21 0756  BP: (!) 150/74 128/68 (!) 156/71 (!) 152/69  Pulse: 79 75 71 71  Resp: 18 20 20 18   Temp: 98.5 F (36.9 C) 98 F (36.7 C) 98.4 F (36.9 C) 99.1 F (37.3 C)  TempSrc: Oral Oral Oral Oral  SpO2: 93% 94% 94% 95%  Weight:      Height:        General: Pt is alert, awake, not in acute distress Cardiovascular: RRR, S1/S2 +, no rubs, no gallops Respiratory: CTA bilaterally, no wheezing, no rhonchi Abdominal: Soft, NT, ND, bowel sounds + Extremities: no edema   The results of significant diagnostics from this hospitalization (including imaging, microbiology, ancillary and laboratory) are listed below for reference.     Microbiology: Recent Results (from the past 240 hour(s))  Resp Panel by RT-PCR (Flu A&B, Covid) Nasopharyngeal Swab     Status: None   Collection Time: 04/12/21  8:48 PM   Specimen: Nasopharyngeal Swab; Nasopharyngeal(NP) swabs in vial transport medium  Result Value Ref Range Status   SARS Coronavirus 2 by RT PCR NEGATIVE NEGATIVE Final    Comment: (NOTE) SARS-CoV-2 target nucleic acids are NOT DETECTED.  The SARS-CoV-2 RNA is generally detectable in upper respiratory specimens during the acute phase of infection. The lowest concentration of SARS-CoV-2 viral copies this assay can detect is 138 copies/mL. A negative result does not preclude SARS-Cov-2 infection and should not be used as the sole basis for treatment or other patient management decisions. A negative result may occur with  improper specimen collection/handling, submission of specimen other than nasopharyngeal swab, presence of viral mutation(s) within the areas targeted by this assay, and inadequate number of viral copies(<138 copies/mL). A negative result must be combined with clinical observations, patient history, and epidemiological information. The expected result is Negative.  Fact Sheet for Patients:   EntrepreneurPulse.com.au  Fact Sheet for Healthcare Providers:  IncredibleEmployment.be  This test is no t yet approved or cleared by the [place] FDA and  has been authorized for detection and/or diagnosis of SARS-CoV-2 by FDA under an Emergency Use Authorization (EUA). This EUA will remain  in effect (meaning this test can be used) for the duration of the COVID-19 declaration under Section 564(b)(1) of the Act, 21 U.S.C.section 360bbb-3(b)(1), unless the authorization is terminated  or revoked sooner.       Influenza A by PCR NEGATIVE NEGATIVE Final   Influenza B by PCR NEGATIVE NEGATIVE Final    Comment: (NOTE) The Xpert Xpress SARS-CoV-2/FLU/RSV plus assay is intended as an aid in the diagnosis of influenza from Nasopharyngeal swab specimens and should not be used as a sole basis for treatment. Nasal washings and aspirates are unacceptable for Xpert Xpress SARS-CoV-2/FLU/RSV testing.  Fact  Sheet for Patients: EntrepreneurPulse.com.au  Fact Sheet for Healthcare Providers: IncredibleEmployment.be  This test is not yet approved or cleared by the [place] FDA and has been authorized for detection and/or diagnosis of SARS-CoV-2 by FDA under an Emergency Use Authorization (EUA). This EUA will remain in effect (meaning this test can be used) for the duration of the COVID-19 declaration under Section 564(b)(1) of the Act, 21 U.S.C. section 360bbb-3(b)(1), unless the authorization is terminated or revoked.  Performed at [facility], [address] [place], [place] [address]      Labs: BNP (last 3 results) No results for input(s): BNP in the last 8760 hours. Basic Metabolic Panel: Recent Labs  Lab 04/12/21 1517 04/14/21 0943  NA 137  --   K 3.8  --   CL 101  --   CO2 25  --   GLUCOSE 133*  --   BUN 18  --   CREATININE 0.45 0.49  CALCIUM 9.2  --    Liver Function  Tests: Recent Labs  Lab 04/12/21 1517  AST 22  ALT 18  ALKPHOS 64  BILITOT 0.8  PROT 7.3  ALBUMIN 4.2   Recent Labs  Lab 04/12/21 1517  LIPASE 56*   No results for input(s): AMMONIA in the last 168 hours. CBC: Recent Labs  Lab 04/12/21 1517 04/14/21 0943  WBC 11.3* 9.1  HGB 15.0 13.9  HCT 43.9 41.0  MCV 93.2 94.3  PLT 345 294   Cardiac Enzymes: No results for input(s): CKTOTAL, CKMB, CKMBINDEX, TROPONINI in the last 168 hours. BNP: Invalid input(s): POCBNP CBG: No results for input(s): GLUCAP in the last 168 hours. D-Dimer No results for input(s): DDIMER in the last 72 hours. Hgb A1c No results for input(s): HGBA1C in the last 72 hours. Lipid Profile No results for input(s): CHOL, HDL, LDLCALC, TRIG, CHOLHDL, LDLDIRECT in the last 72 hours. Thyroid function studies No results for input(s): TSH, T4TOTAL, T3FREE, THYROIDAB in the last 72 hours.  Invalid input(s): FREET3 Anemia work up No results for input(s): VITAMINB12, FOLATE, FERRITIN, TIBC, IRON, RETICCTPCT in the last 72 hours. Urinalysis    Component Value Date/Time   COLORURINE YELLOW (A) 04/12/2021 2107   APPEARANCEUR HAZY (A) 04/12/2021 2107   APPEARANCEUR Clear 05/22/2014 2223   LABSPEC 1.016 04/12/2021 2107   LABSPEC 1.024 05/22/2014 2223   PHURINE 6.0 04/12/2021 2107   GLUCOSEU NEGATIVE 04/12/2021 2107   GLUCOSEU Negative 05/22/2014 2223   HGBUR MODERATE (A) 04/12/2021 2107   BILIRUBINUR NEGATIVE 04/12/2021 2107   BILIRUBINUR Negative 07/06/2019 1154   BILIRUBINUR Negative 05/22/2014 2223   KETONESUR 20 (A) 04/12/2021 2107   PROTEINUR 30 (A) 04/12/2021 2107   UROBILINOGEN 0.2 07/06/2019 1154   NITRITE NEGATIVE 04/12/2021 2107   LEUKOCYTESUR SMALL (A) 04/12/2021 2107   LEUKOCYTESUR Trace 05/22/2014 2223   Sepsis Labs Invalid input(s): PROCALCITONIN,  WBC,  LACTICIDVEN Microbiology Recent Results (from the past 240 hour(s))  Resp Panel by RT-PCR (Flu A&B, Covid) Nasopharyngeal Swab      Status: None   Collection Time: 04/12/21  8:48 PM   Specimen: Nasopharyngeal Swab; Nasopharyngeal(NP) swabs in vial transport medium  Result Value Ref Range Status   SARS Coronavirus 2 by RT PCR NEGATIVE NEGATIVE Final    Comment: (NOTE) SARS-CoV-2 target nucleic acids are NOT DETECTED.  The SARS-CoV-2 RNA is generally detectable in upper respiratory specimens during the acute phase of infection. The lowest concentration of SARS-CoV-2 viral copies this assay can detect is 138 copies/mL. A negative result  does not preclude SARS-Cov-2 infection and should not be used as the sole basis for treatment or other patient management decisions. A negative result may occur with  improper specimen collection/handling, submission of specimen other than nasopharyngeal swab, presence of viral mutation(s) within the areas targeted by this assay, and inadequate number of viral copies(<138 copies/mL). A negative result must be combined with clinical observations, patient history, and epidemiological information. The expected result is Negative.  Fact Sheet for Patients:  EntrepreneurPulse.com.au  Fact Sheet for Healthcare Providers:  IncredibleEmployment.be  This test is no t yet approved or cleared by the [place] FDA and  has been authorized for detection and/or diagnosis of SARS-CoV-2 by FDA under an Emergency Use Authorization (EUA). This EUA will remain  in effect (meaning this test can be used) for the duration of the COVID-19 declaration under Section 564(b)(1) of the Act, 21 U.S.C.section 360bbb-3(b)(1), unless the authorization is terminated  or revoked sooner.       Influenza A by PCR NEGATIVE NEGATIVE Final   Influenza B by PCR NEGATIVE NEGATIVE Final    Comment: (NOTE) The Xpert Xpress SARS-CoV-2/FLU/RSV plus assay is intended as an aid in the diagnosis of influenza from Nasopharyngeal swab specimens and should not be used as a sole basis  for treatment. Nasal washings and aspirates are unacceptable for Xpert Xpress SARS-CoV-2/FLU/RSV testing.  Fact Sheet for Patients: EntrepreneurPulse.com.au  Fact Sheet for Healthcare Providers: IncredibleEmployment.be  This test is not yet approved or cleared by the [place] FDA and has been authorized for detection and/or diagnosis of SARS-CoV-2 by FDA under an Emergency Use Authorization (EUA). This EUA will remain in effect (meaning this test can be used) for the duration of the COVID-19 declaration under Section 564(b)(1) of the Act, 21 U.S.C. section 360bbb-3(b)(1), unless the authorization is terminated or revoked.  Performed at [facility], [address] [place], [place] [address]      Time coordinating discharge: Over 30 minutes  SIGNED:   Nolberto Hanlon, MD  [facility] 04/14/2021, 12:03 PM Pager   If 7PM-7AM, please contact night-coverage [email or web address] Password [email or web address]

## 2021-04-15 ENCOUNTER — Telehealth: Payer: Self-pay

## 2021-04-15 NOTE — Telephone Encounter (Signed)
Transition Care Management Follow-up Telephone Call Date of discharge and from where: 04/14/21 [facility] How have you been since you were released from the hospital? Feeling better, still w/ dizziness Any questions or concerns? No  Items Reviewed: Did the pt receive and understand the discharge instructions provided? Yes  Medications obtained and verified? Yes  Other? No  Any new allergies since your discharge? No  Dietary orders reviewed? No Do you have support at home? No   Home Care and Equipment/Supplies: Were home health services ordered? no  Functional Questionnaire: (I = Independent and D = Dependent) ADLs: 1  Bathing/Dressing- 1  Meal Prep- 1  Eating- 1  Maintaining continence- 1  Transferring/Ambulation- 1  Managing Meds- 1  Follow up appointments reviewed:  PCP Hospital f/u appt confirmed? Yes  Scheduled to see Tally Joe on 04/17/21 @ 10:40. [facility] f/u appt confirmed? No  S Are transportation arrangements needed? No  If their condition worsens, is the pt aware to call PCP or go to the Emergency Dept.? Yes Was the patient provided with contact information for the PCP's office or ED? Yes Was to pt encouraged to call back with questions or concerns? Yes

## 2021-04-16 NOTE — Telephone Encounter (Signed)
Fyi. This is the transitions of care call.

## 2021-04-17 ENCOUNTER — Encounter: Payer: Self-pay | Admitting: Physician Assistant

## 2021-04-17 ENCOUNTER — Other Ambulatory Visit: Payer: Self-pay

## 2021-04-17 ENCOUNTER — Ambulatory Visit (INDEPENDENT_AMBULATORY_CARE_PROVIDER_SITE_OTHER): Payer: PPO | Admitting: Physician Assistant

## 2021-04-17 VITALS — BP 152/82 | HR 97 | Temp 98.6°F | Ht 61.0 in

## 2021-04-17 DIAGNOSIS — Z23 Encounter for immunization: Secondary | ICD-10-CM

## 2021-04-17 DIAGNOSIS — R112 Nausea with vomiting, unspecified: Secondary | ICD-10-CM

## 2021-04-17 DIAGNOSIS — R42 Dizziness and giddiness: Secondary | ICD-10-CM

## 2021-04-17 NOTE — Progress Notes (Signed)
Established patient visit   Patient: Misty Page   DOB: [birth year]   80 y.o. Female  MRN: [id number] Visit Date: 04/17/2021  Today's healthcare provider: Mikey Kirschner, [name]   Chief Complaint  Patient presents with   Hospitalization Follow-up   Subjective    HPI  Follow up Hospitalization  Patient was admitted to [facility] on 04/12/21 and discharged on 04/14/21. She was treated for vertigo. Treatment for this included  IV fluids as she had decreased p.o. intake and was started on meclizine. Telephone follow up was done on 04/14/21 She reports good compliance with treatment. She reports this condition is  improving still has dizziness .  Meclizine is assisting but not completely resolved dizziness.  ----------------------------------------------------------------------------------------- Misty Page is an 80 y/o who reports suddenly feeling the room spinning/nausea/vomiting on Sunday. She was unable to stop vomiting and is disabled, so she went to the ED. She reports today overall her dizziness is improved, no longer nauseous or vomiting. She has been taking Meclizine three times daily. Still feels very tired and not like herself.  Denies any changes in vision, headaches, slurred speech, weakness.   Medications: Outpatient Medications Prior to Visit  Medication Sig   amLODipine (NORVASC) 5 MG tablet TAKE 1 TABLET BY MOUTH DAILY   Ascorbic Acid (VITAMIN C) 1000 MG tablet Take 1,000 mg by mouth daily.   aspirin EC 81 MG EC tablet Take 1 tablet (81 mg total) by mouth daily. Swallow whole.   Calcium-Vitamin D-Vitamin K 500-100-40 MG-UNT-MCG CHEW Chew 1 tablet by mouth 2 (two) times daily. VIACTIV, 500-100-40 (Oral Tablet Chewable)  1 tablet twice a day for 0 days  Quantity: 0.00;  Refills: 0   Ordered :13-Apr-2010  Karoline Caldwell, Anastasiya ;  Started 29-October-2008 Active Comments: DX: 733.00   clopidogrel (PLAVIX) 75 MG tablet TAKE 1 TABLET BY MOUTH DAILY    Cranberry-Cholecalciferol 4200-500 MG-UNIT CAPS Take by mouth.   ezetimibe (ZETIA) 10 MG tablet TAKE 1 TABLET BY MOUTH DAILY   famotidine (PEPCID) 20 MG tablet TAKE 1 TABLET BY MOUTH 2 TIMES DAILY   ferrous sulfate 325 (65 FE) MG EC tablet Take 325 mg by mouth daily.   fexofenadine (ALLEGRA) 180 MG tablet Take 180 mg by mouth at bedtime as needed.   fluticasone (FLONASE) 50 MCG/ACT nasal spray USE 2 SPRAYS EACH NOSTRIL DAILY (Patient taking differently: Place 2 sprays into both nostrils daily as needed.)   losartan (COZAAR) 50 MG tablet TAKE 1 TABLET BY MOUTH DAILY   meclizine (ANTIVERT) 25 MG tablet Take 1 tablet (25 mg total) by mouth 3 (three) times daily as needed for dizziness or nausea.   meclizine (ANTIVERT) 25 MG tablet Take 1 tablet (25 mg total) by mouth 3 (three) times daily.   Mesalamine 800 MG TBEC Take 800 mg by mouth 2 (two) times daily.   methimazole (TAPAZOLE) 5 MG tablet Take 2.5 mg by mouth daily.    ondansetron (ZOFRAN ODT) 4 MG disintegrating tablet Take 1 tablet (4 mg total) by mouth every 8 (eight) hours as needed for nausea or vomiting.   Probiotic Product (PROBIOTIC DAILY PO) Take 1 capsule by mouth daily.   promethazine (PHENERGAN) 6.25 MG/5ML syrup Take 5 mLs (6.25 mg total) by mouth every 8 (eight) hours as needed for up to 7 days for nausea or vomiting.   rosuvastatin (CRESTOR) 10 MG tablet Take 1 tablet (10 mg total) by mouth daily.   No facility-administered medications prior to visit.  Review of Systems  Constitutional:  Negative for fatigue and fever.  Respiratory:  Negative for shortness of breath.   Cardiovascular:  Negative for chest pain.  Gastrointestinal:  Negative for nausea and vomiting.  Neurological:  Positive for dizziness. Negative for headaches.  All other systems reviewed and are negative.  Last CBC Lab Results  Component Value Date   WBC 9.1 04/14/2021   HGB 13.9 04/14/2021   HCT 41.0 04/14/2021   MCV 94.3 04/14/2021   MCH 32.0  04/14/2021   RDW 13.8 04/14/2021   PLT 294 51/88/4166   Last metabolic panel Lab Results  Component Value Date   GLUCOSE 133 (H) 04/12/2021   NA 137 04/12/2021   K 3.8 04/12/2021   CL 101 04/12/2021   CO2 25 04/12/2021   BUN 18 04/12/2021   CREATININE 0.49 04/14/2021   GFRNONAA >60 04/14/2021   CALCIUM 9.2 04/12/2021   PROT 7.3 04/12/2021   ALBUMIN 4.2 04/12/2021   LABGLOB 2.0 04/28/2020   AGRATIO 2.1 04/28/2020   BILITOT 0.8 04/12/2021   ALKPHOS 64 04/12/2021   AST 22 04/12/2021   ALT 18 04/12/2021   ANIONGAP 11 04/12/2021   Last lipids Lab Results  Component Value Date   CHOL 184 04/28/2020   HDL 59 04/28/2020   LDLCALC 107 (H) 04/28/2020   TRIG 98 04/28/2020   CHOLHDL 3.1 04/28/2020    Last thyroid functions Lab Results  Component Value Date   TSH 0.22 (A) 08/06/2020   T4TOTAL 7.3 12/22/2016    Last vitamin B12 and Folate Lab Results  Component Value Date   VITAMINB12 487 11/21/2019   FOLATE 18.1 11/21/2019       Objective    BP (!) 152/82 (BP Location: Right Arm, Patient Position: Sitting, Cuff Size: Large)   Pulse 97   Temp 98.6 F (37 C) (Oral)   Ht 5' 1"  (1.549 m)   SpO2 97%   BMI 23.81 kg/m  BP Readings from Last 3 Encounters:  04/17/21 (!) 152/82  04/14/21 119/70  11/17/20 (!) 183/80   Wt Readings from Last 3 Encounters:  04/12/21 126 lb (57.2 kg)  11/17/20 124 lb (56.2 kg)  10/24/20 124 lb 14.4 oz (56.7 kg)      Physical Exam Constitutional:      Appearance: Normal appearance.     Comments: Wheelchair bound  Cardiovascular:     Rate and Rhythm: Normal rate and regular rhythm.     Pulses: Normal pulses.     Heart sounds: Normal heart sounds.  Pulmonary:     Effort: Pulmonary effort is normal.     Breath sounds: Normal breath sounds.  Neurological:     Mental Status: She is alert and oriented to person, place, and time.  Psychiatric:        Mood and Affect: Mood normal.        Behavior: Behavior normal.     No results  found for any visits on 04/17/21.  Assessment & Plan     Vertigo, likely BPPV Advised to start tapering off the meclizine, to start taking only BID, AM and PM for 5-7 days, and then to one daily, and then to none.  Described the Epley maunever, she can try to do this with assistance or modified. If increase in vertigo symptoms, return of nausea, vomiting ,please call office.   Return if symptoms worsen or fail to improve.      I, Mikey Kirschner, [name] have reviewed all documentation for this visit. The documentation  on  04/17/2021  for the exam, diagnosis, procedures, and orders are all accurate and complete.    Mikey Kirschner, [name]  [facility] [phone] (phone) [phone] (fax)  [place]

## 2021-04-21 DIAGNOSIS — R42 Dizziness and giddiness: Secondary | ICD-10-CM | POA: Diagnosis not present

## 2021-04-30 DIAGNOSIS — K5730 Diverticulosis of large intestine without perforation or abscess without bleeding: Secondary | ICD-10-CM | POA: Diagnosis not present

## 2021-04-30 DIAGNOSIS — Z7901 Long term (current) use of anticoagulants: Secondary | ICD-10-CM | POA: Diagnosis not present

## 2021-04-30 DIAGNOSIS — S78112A Complete traumatic amputation at level between left hip and knee, initial encounter: Secondary | ICD-10-CM | POA: Insufficient documentation

## 2021-04-30 DIAGNOSIS — K5100 Ulcerative (chronic) pancolitis without complications: Secondary | ICD-10-CM | POA: Diagnosis not present

## 2021-05-01 ENCOUNTER — Other Ambulatory Visit: Payer: Self-pay | Admitting: Family Medicine

## 2021-05-01 DIAGNOSIS — I10 Essential (primary) hypertension: Secondary | ICD-10-CM

## 2021-05-04 ENCOUNTER — Encounter: Payer: Self-pay | Admitting: Family Medicine

## 2021-05-04 ENCOUNTER — Ambulatory Visit (INDEPENDENT_AMBULATORY_CARE_PROVIDER_SITE_OTHER): Payer: PPO | Admitting: Family Medicine

## 2021-05-04 ENCOUNTER — Other Ambulatory Visit: Payer: Self-pay

## 2021-05-04 VITALS — BP 140/70 | HR 83 | Temp 97.9°F | Resp 16

## 2021-05-04 DIAGNOSIS — R42 Dizziness and giddiness: Secondary | ICD-10-CM | POA: Diagnosis not present

## 2021-05-04 DIAGNOSIS — R300 Dysuria: Secondary | ICD-10-CM | POA: Diagnosis not present

## 2021-05-04 DIAGNOSIS — I10 Essential (primary) hypertension: Secondary | ICD-10-CM

## 2021-05-04 DIAGNOSIS — E7800 Pure hypercholesterolemia, unspecified: Secondary | ICD-10-CM

## 2021-05-04 LAB — POCT URINALYSIS DIPSTICK
Bilirubin, UA: NEGATIVE
Glucose, UA: NEGATIVE
Ketones, UA: NEGATIVE
Leukocytes, UA: NEGATIVE
Nitrite, UA: NEGATIVE
Protein, UA: NEGATIVE
Spec Grav, UA: 1.01 (ref 1.010–1.025)
Urobilinogen, UA: 0.2 E.U./dL
pH, UA: 7 (ref 5.0–8.0)

## 2021-05-04 MED ORDER — LOSARTAN POTASSIUM 100 MG PO TABS: 100.0000 mg | ORAL_TABLET | Freq: Every day | ORAL | 1 refills | Status: DC

## 2021-05-04 MED ORDER — ONDANSETRON 4 MG PO TBDP: 4.0000 mg | ORAL_TABLET | Freq: Three times a day (TID) | ORAL | 0 refills | Status: DC | PRN

## 2021-05-04 NOTE — Assessment & Plan Note (Addendum)
Improved, reasonably well controlled  Replace home BP machine, continue home BP Continue amlodipine 64m Increase losartan to 1038mReviewed recent metabolic panel F/u in 3 months

## 2021-05-04 NOTE — Progress Notes (Signed)
Established patient visit   Patient: Misty Page   DOB: 06-Oct-1940   80 y.o. Female  MRN: [id number] Visit Date: 05/04/2021  Today's healthcare provider: Lavon Paganini, MD   Chief Complaint  Patient presents with   Hypertension   Subjective    Hypertension Pertinent negatives include no headaches.   Hypertension - Home BP 150-180s - BP machine reading higher than in-office machine on comparison  Dysuria - Feeling "heaviness" that often precedes UTIs  Vertigo - Occasional dizziness and nausea when turning head too quickly. Occurs when in cars and in wheelchair as well - Concerned about fall risk   Medications: Outpatient Medications Prior to Visit  Medication Sig   amLODipine (NORVASC) 5 MG tablet TAKE 1 TABLET BY MOUTH DAILY   Ascorbic Acid (VITAMIN C) 1000 MG tablet Take 1,000 mg by mouth daily.   aspirin EC 81 MG EC tablet Take 1 tablet (81 mg total) by mouth daily. Swallow whole.   Calcium-Vitamin D-Vitamin K 500-100-40 MG-UNT-MCG CHEW Chew 1 tablet by mouth 2 (two) times daily. VIACTIV, 500-100-40 (Oral Tablet Chewable)  1 tablet twice a day for 0 days  Quantity: 0.00;  Refills: 0   Ordered :13-Apr-2010  Misty Page, Misty Page ;  Started 29-October-2008 Active Comments: DX: 733.00   clopidogrel (PLAVIX) 75 MG tablet TAKE 1 TABLET BY MOUTH DAILY   Cranberry-Cholecalciferol 4200-500 MG-UNIT CAPS Take by mouth.   ezetimibe (ZETIA) 10 MG tablet TAKE 1 TABLET BY MOUTH DAILY   famotidine (PEPCID) 20 MG tablet TAKE 1 TABLET BY MOUTH 2 TIMES DAILY   ferrous sulfate 325 (65 FE) MG EC tablet Take 325 mg by mouth daily.   fexofenadine (ALLEGRA) 180 MG tablet Take 180 mg by mouth at bedtime as needed.   fluticasone (FLONASE) 50 MCG/ACT nasal spray USE 2 SPRAYS EACH NOSTRIL DAILY (Patient taking differently: Place 2 sprays into both nostrils daily as needed.)   meclizine (ANTIVERT) 25 MG tablet Take 1 tablet (25 mg total) by mouth 3 (three) times daily as needed for  dizziness or nausea.   Mesalamine 800 MG TBEC Take 800 mg by mouth 2 (two) times daily.   methimazole (TAPAZOLE) 5 MG tablet Take 2.5 mg by mouth daily.    Probiotic Product (PROBIOTIC DAILY PO) Take 1 capsule by mouth daily.   rosuvastatin (CRESTOR) 10 MG tablet Take 1 tablet (10 mg total) by mouth daily.   [DISCONTINUED] losartan (COZAAR) 50 MG tablet TAKE 1 TABLET BY MOUTH DAILY   [DISCONTINUED] meclizine (ANTIVERT) 25 MG tablet Take 1 tablet (25 mg total) by mouth 3 (three) times daily.   [DISCONTINUED] ondansetron (ZOFRAN ODT) 4 MG disintegrating tablet Take 1 tablet (4 mg total) by mouth every 8 (eight) hours as needed for nausea or vomiting.   promethazine (PHENERGAN) 6.25 MG/5ML syrup Take 5 mLs (6.25 mg total) by mouth every 8 (eight) hours as needed for up to 7 days for nausea or vomiting.   No facility-administered medications prior to visit.    Review of Systems  Constitutional: Negative.   HENT: Negative.    Eyes: Negative.   Respiratory: Negative.    Cardiovascular: Negative.   Gastrointestinal:  Negative for vomiting.  Genitourinary:  Positive for dysuria.  Musculoskeletal:  Positive for gait problem. Negative for myalgias.  Skin: Negative.   Neurological:  Positive for dizziness. Negative for light-headedness and headaches.  Psychiatric/Behavioral: Negative.        Objective    BP 140/70 (BP Location: Right Arm, Patient Position:  Sitting, Cuff Size: Large)   Pulse 83   Temp 97.9 F (36.6 C) (Temporal)   Resp 16   SpO2 95%  {Show previous vital signs (optional):23777}  Physical Exam Vitals reviewed.  Constitutional:      General: She is not in acute distress.    Appearance: Normal appearance. She is not ill-appearing or toxic-appearing.     Comments: Sitting in wheelchair  HENT:     Head: Normocephalic and atraumatic.     Right Ear: External ear normal.     Left Ear: External ear normal.     Nose: Nose normal.     Mouth/Throat:     Mouth: Mucous  membranes are moist.     Pharynx: Oropharynx is clear. No oropharyngeal exudate or posterior oropharyngeal erythema.  Eyes:     General: No scleral icterus.    Extraocular Movements: Extraocular movements intact.     Conjunctiva/sclera: Conjunctivae normal.     Pupils: Pupils are equal, round, and reactive to light.  Cardiovascular:     Rate and Rhythm: Normal rate and regular rhythm.     Pulses: Normal pulses.     Heart sounds: Murmur heard.    No friction rub.  Pulmonary:     Effort: Pulmonary effort is normal. No respiratory distress.     Breath sounds: Normal breath sounds. No wheezing or rhonchi.  Chest:     Chest wall: No tenderness.  Abdominal:     General: Abdomen is flat.     Palpations: Abdomen is soft.  Musculoskeletal:        General: Normal range of motion.     Cervical back: Normal range of motion and neck supple.  Skin:    General: Skin is warm and dry.     Capillary Refill: Capillary refill takes less than 2 seconds.     Findings: No lesion or rash.  Neurological:     General: No focal deficit present.     Mental Status: She is alert and oriented to person, place, and time. Mental status is at baseline.  Psychiatric:        Mood and Affect: Mood normal.     No results found for any visits on 05/04/21.  Assessment & Plan     Problem List Items Addressed This Visit       Cardiovascular and Mediastinum   Essential (primary) hypertension - Primary    Improved, reasonably well controlled  Replace home BP machine, continue home BP Continue amlodipine 21m Increase losartan to 1055mReviewed recent metabolic panel F/u in 3 months      Relevant Medications   losartan (COZAAR) 100 MG tablet     Other   Hypercholesteremia    Previously well controlled Continue rosuvastatin 1073mnd zetia 42m42mcheck FLP      Relevant Medications   losartan (COZAAR) 100 MG tablet   Other Relevant Orders   Lipid panel   Vertigo    Improved, intermittent Discussed  behavioral modifications Start Zofran as needed for nausea      Dysuria    Check UA and UC to determine need to start antibiotic      Relevant Orders   POCT Urinalysis Dipstick   Urine Culture     Return in about 3 months (around 08/02/2021) for BP f/u.      ElliNelva Naydical Student 05/04/2021, 12:56 PM   Patient seen along with MS3 student ElliNelva Naypersonally evaluated this patient along with the student, and verified all  aspects of the history, physical exam, and medical decision making as documented by the student. I agree with the student's documentation and have made all necessary edits.  Cydney Alvarenga, Dionne Bucy, MD, MPH [facility]

## 2021-05-04 NOTE — Assessment & Plan Note (Signed)
Check UA and UC to determine need to start antibiotic

## 2021-05-04 NOTE — Assessment & Plan Note (Addendum)
Previously well controlled Continue rosuvastatin 32m and zetia 19mRecheck FLP

## 2021-05-04 NOTE — Assessment & Plan Note (Signed)
Improved, intermittent Discussed behavioral modifications Start Zofran as needed for nausea

## 2021-05-08 DIAGNOSIS — E7800 Pure hypercholesterolemia, unspecified: Secondary | ICD-10-CM | POA: Diagnosis not present

## 2021-05-09 LAB — LIPID PANEL
Chol/HDL Ratio: 3 ratio (ref 0.0–4.4)
Cholesterol, Total: 193 mg/dL (ref 100–199)
HDL: 65 mg/dL (ref >39)
LDL Chol Calc (NIH): 114 mg/dL — ABNORMAL HIGH (ref 0–99)
Triglycerides: 76 mg/dL (ref 0–149)
VLDL Cholesterol Cal: 14 mg/dL (ref 5–40)

## 2021-05-11 ENCOUNTER — Other Ambulatory Visit: Payer: Self-pay | Admitting: Family Medicine

## 2021-05-11 LAB — URINE CULTURE

## 2021-05-11 MED ORDER — NITROFURANTOIN MONOHYD MACRO 100 MG PO CAPS: 100.0000 mg | ORAL_CAPSULE | Freq: Two times a day (BID) | ORAL | 0 refills | Status: AC

## 2021-05-14 DIAGNOSIS — I25118 Atherosclerotic heart disease of native coronary artery with other forms of angina pectoris: Secondary | ICD-10-CM | POA: Diagnosis not present

## 2021-05-14 DIAGNOSIS — Z7982 Long term (current) use of aspirin: Secondary | ICD-10-CM | POA: Diagnosis not present

## 2021-05-14 DIAGNOSIS — D692 Other nonthrombocytopenic purpura: Secondary | ICD-10-CM | POA: Diagnosis not present

## 2021-05-14 DIAGNOSIS — I708 Atherosclerosis of other arteries: Secondary | ICD-10-CM | POA: Diagnosis not present

## 2021-05-14 DIAGNOSIS — I10 Essential (primary) hypertension: Secondary | ICD-10-CM | POA: Diagnosis not present

## 2021-05-14 DIAGNOSIS — I739 Peripheral vascular disease, unspecified: Secondary | ICD-10-CM | POA: Diagnosis not present

## 2021-05-14 DIAGNOSIS — D6869 Other thrombophilia: Secondary | ICD-10-CM | POA: Diagnosis not present

## 2021-05-14 DIAGNOSIS — I480 Paroxysmal atrial fibrillation: Secondary | ICD-10-CM | POA: Diagnosis not present

## 2021-05-14 DIAGNOSIS — Z7902 Long term (current) use of antithrombotics/antiplatelets: Secondary | ICD-10-CM | POA: Diagnosis not present

## 2021-05-14 DIAGNOSIS — Z89612 Acquired absence of left leg above knee: Secondary | ICD-10-CM | POA: Diagnosis not present

## 2021-05-14 DIAGNOSIS — Z993 Dependence on wheelchair: Secondary | ICD-10-CM | POA: Diagnosis not present

## 2021-05-15 ENCOUNTER — Other Ambulatory Visit: Payer: Self-pay | Admitting: Family Medicine

## 2021-05-16 NOTE — Telephone Encounter (Signed)
Requested Prescriptions  Pending Prescriptions Disp Refills   famotidine (PEPCID) 20 MG tablet [Pharmacy Med Name: FAMOTIDINE 20 MG TAB] 180 tablet 0    Sig: TAKE 1 TABLET BY MOUTH 2 TIMES DAILY     Gastroenterology:  H2 Antagonists Passed - 05/15/2021 10:57 AM      Passed - Valid encounter within last 12 months    Recent Outpatient Visits          1 week ago Essential (primary) hypertension   [facility], Dionne Bucy, MD   4 weeks ago [place] Thedore Mins, [place], [name]   6 months ago Encounter for annual wellness visit (AWV) in Medicare patient   [facility], Dionne Bucy, MD   1 year ago Essential (primary) hypertension   [facility], Dionne Bucy, MD   1 year ago Annual physical exam   [facility] [place], Dionne Bucy, MD      Future Appointments            In 2 months Drubel, Ria Comment, [name] [facility], [place]   In 3 months [name], Dionne Bucy, MD [facility], [facility]            ezetimibe (ZETIA) 10 MG tablet [Pharmacy Med Name: EZETIMIBE 10 MG TAB] 90 tablet 1    Sig: TAKE 1 TABLET BY MOUTH DAILY     Cardiovascular:  Antilipid - Sterol Transport Inhibitors Failed - 05/15/2021 10:57 AM      Failed - LDL in normal range and within 360 days    LDL Chol Calc (NIH)  Date Value Ref Range Status  05/08/2021 114 (H) 0 - 99 mg/dL Final         Passed - Total Cholesterol in normal range and within 360 days    Cholesterol, Total  Date Value Ref Range Status  05/08/2021 193 100 - 199 mg/dL Final         Passed - HDL in normal range and within 360 days    HDL  Date Value Ref Range Status  05/08/2021 65 >39 mg/dL Final         Passed - Triglycerides in normal range and within 360 days    Triglycerides  Date Value Ref Range Status  05/08/2021 76 0 - 149 mg/dL Final         Passed - Valid encounter within last 12 months    Recent  Outpatient Visits          1 week ago Essential (primary) hypertension   [facility], Dionne Bucy, MD   4 weeks ago [place] Mikey Kirschner, [name]   6 months ago Encounter for annual wellness visit (AWV) in Medicare patient   [facility], Dionne Bucy, MD   1 year ago Essential (primary) hypertension   [facility] [name], Dionne Bucy, MD   1 year ago Annual physical exam   [facility] [place], Dionne Bucy, MD      Future Appointments            In 2 months Thedore Mins, Ria Comment, [name] [facility], [place]   In 3 months [name], Dionne Bucy, MD [facility], [facility]            amLODipine (NORVASC) 5 MG tablet [Pharmacy Med Name: AMLODIPINE BESYLATE 5 MG TAB] 90 tablet 1    Sig: TAKE 1 TABLET BY MOUTH DAILY  Cardiovascular:  Calcium Channel Blockers Failed - 05/15/2021 10:57 AM      Failed - Last BP in normal range    BP Readings from Last 1 Encounters:  05/04/21 140/70         Passed - Valid encounter within last 6 months    Recent Outpatient Visits          1 week ago Essential (primary) hypertension   [facility] [place], Dionne Bucy, MD   4 weeks ago [place] Mikey Kirschner, [name]   6 months ago Encounter for annual wellness visit (AWV) in Medicare patient   [place], Dionne Bucy, MD   1 year ago Essential (primary) hypertension   [facility] [name], Dionne Bucy, MD   1 year ago Annual physical exam   [facility] Virginia Crews, MD      Future Appointments            In 2 months Thedore Mins, [place] [facility], [place]   In 3 months [name], Dionne Bucy, MD [facility], [place]

## 2021-05-18 ENCOUNTER — Ambulatory Visit (INDEPENDENT_AMBULATORY_CARE_PROVIDER_SITE_OTHER): Payer: PPO | Admitting: Vascular Surgery

## 2021-05-18 ENCOUNTER — Other Ambulatory Visit: Payer: Self-pay

## 2021-05-18 ENCOUNTER — Ambulatory Visit (INDEPENDENT_AMBULATORY_CARE_PROVIDER_SITE_OTHER): Payer: PPO

## 2021-05-18 VITALS — BP 143/77 | HR 83 | Ht 60.0 in | Wt 125.0 lb

## 2021-05-18 DIAGNOSIS — I739 Peripheral vascular disease, unspecified: Secondary | ICD-10-CM

## 2021-05-18 DIAGNOSIS — I10 Essential (primary) hypertension: Secondary | ICD-10-CM

## 2021-05-18 DIAGNOSIS — E7800 Pure hypercholesterolemia, unspecified: Secondary | ICD-10-CM

## 2021-05-18 DIAGNOSIS — K219 Gastro-esophageal reflux disease without esophagitis: Secondary | ICD-10-CM | POA: Diagnosis not present

## 2021-05-18 DIAGNOSIS — K559 Vascular disorder of intestine, unspecified: Secondary | ICD-10-CM | POA: Diagnosis not present

## 2021-05-18 DIAGNOSIS — I6523 Occlusion and stenosis of bilateral carotid arteries: Secondary | ICD-10-CM | POA: Diagnosis not present

## 2021-05-18 MED ORDER — AMLODIPINE BESYLATE 5 MG PO TABS: 5.0000 mg | ORAL_TABLET | Freq: Every day | ORAL | 1 refills | Status: DC

## 2021-05-18 MED ORDER — EZETIMIBE 10 MG PO TABS: 10.0000 mg | ORAL_TABLET | Freq: Every day | ORAL | 1 refills | Status: DC

## 2021-05-18 MED ORDER — FAMOTIDINE 20 MG PO TABS
20.0000 mg | ORAL_TABLET | Freq: Two times a day (BID) | ORAL | 0 refills | Status: DC
Start: 2021-05-18 — End: 2021-08-14

## 2021-05-18 NOTE — Progress Notes (Signed)
MRN : [id number]  Misty Page is a 80 y.o. (10-05-1940) female who presents with chief complaint of check the stent.  History of Present Illness:   Patient presents today after intervention on 02/13/2020 for acute on chronic mesenteric ischemia.  The patient notes that since she has had her intervention done her abdominal pain has imporved.  She denies any nausea or vomiting.  She denies any food phobias.  She denies any fever, chills, nausea, vomiting or diarrhea.  There was also a stent placed within the SMA.     Duplex ultrasound of the mesenteric arteries shows the stent placed in the superior mesenteric artery is widely patent.     Previous carotid duplex shows <40% stenosis bilaterally  Current Meds  Medication Sig   amLODipine (NORVASC) 5 MG tablet TAKE 1 TABLET BY MOUTH DAILY   Ascorbic Acid (VITAMIN C) 1000 MG tablet Take 1,000 mg by mouth daily.   aspirin EC 81 MG EC tablet Take 1 tablet (81 mg total) by mouth daily. Swallow whole.   Calcium-Vitamin D-Vitamin K 500-100-40 MG-UNT-MCG CHEW Chew 1 tablet by mouth 2 (two) times daily. VIACTIV, 500-100-40 (Oral Tablet Chewable)  1 tablet twice a day for 0 days  Quantity: 0.00;  Refills: 0   Ordered :13-Apr-2010  Karoline Caldwell, Anastasiya ;  Started 29-October-2008 Active Comments: DX: 733.00   clopidogrel (PLAVIX) 75 MG tablet TAKE 1 TABLET BY MOUTH DAILY   Cranberry-Cholecalciferol 4200-500 MG-UNIT CAPS Take by mouth.   ezetimibe (ZETIA) 10 MG tablet TAKE 1 TABLET BY MOUTH DAILY   famotidine (PEPCID) 20 MG tablet TAKE 1 TABLET BY MOUTH 2 TIMES DAILY   ferrous sulfate 325 (65 FE) MG EC tablet Take 325 mg by mouth daily.   fexofenadine (ALLEGRA) 180 MG tablet Take 180 mg by mouth at bedtime as needed.   fluticasone (FLONASE) 50 MCG/ACT nasal spray USE 2 SPRAYS EACH NOSTRIL DAILY (Patient taking differently: Place 2 sprays into both nostrils daily as needed.)   losartan (COZAAR) 100 MG tablet Take 1 tablet (100 mg total) by mouth  daily.   meclizine (ANTIVERT) 25 MG tablet Take 1 tablet (25 mg total) by mouth 3 (three) times daily as needed for dizziness or nausea.   Mesalamine 800 MG TBEC Take 800 mg by mouth 2 (two) times daily.   methimazole (TAPAZOLE) 5 MG tablet Take 2.5 mg by mouth daily.    ondansetron (ZOFRAN ODT) 4 MG disintegrating tablet Take 1 tablet (4 mg total) by mouth every 8 (eight) hours as needed for nausea or vomiting.   Probiotic Product (PROBIOTIC DAILY PO) Take 1 capsule by mouth daily.   rosuvastatin (CRESTOR) 10 MG tablet Take 1 tablet (10 mg total) by mouth daily.    Past Medical History:  Diagnosis Date   Allergy    Anemia    Arthritis    CA of skin 10/15/2014   Calculus of kidney 10/15/2014   Seen in ER 06/16/10.    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   Coronary artery disease    Diverticulitis    Environmental and seasonal allergies    Fibrocystic breast disease (FCBD)    GERD (gastroesophageal reflux disease)    Heart murmur    High cholesterol    History of heart artery stent    Hyperlipidemia    Hypertension    Hyperthyroidism    Myocardial infarction (HCC)    Osteomyelitis of  left femur ([place]) 11/30/2018   Osteoporosis    Reactive depression 02/25/2019   Thyroid disease    TIA (transient ischemic attack)     Past Surgical History:  Procedure Laterality Date   ABDOMINAL HYSTERECTOMY  2007   BREAST ENHANCEMENT SURGERY  2004   BREAST IMPLANT REMOVAL     BREAST SURGERY  1984   Fibrocystic Disease   CAROTID ENDARTERECTOMY Left 06/21/2014   Dr. Delana Meyer   CAROTID ENDARTERECTOMY Right 08/02/2014   Dr. Delana Meyer   CAROTID PTA/STENT INTERVENTION N/A 06/15/2017   Procedure: CAROTID PTA/STENT INTERVENTION;  Surgeon: Katha Cabal, MD;  Location: [place] [facility];  Service: Cardiovascular;  Laterality: N/A;   COLONOSCOPY WITH PROPOFOL N/A 07/14/2015   Procedure: COLONOSCOPY WITH PROPOFOL;  Surgeon: Hulen Luster, MD;  Location: [facility] ENDOSCOPY;  Service: Gastroenterology;  Laterality: N/A;   COLONOSCOPY WITH PROPOFOL N/A 02/08/2018   Procedure: COLONOSCOPY WITH PROPOFOL;  Surgeon: Toledo, Benay Pike, MD;  Location: [facility] ENDOSCOPY;  Service: Gastroenterology;  Laterality: N/A;   CORONARY ANGIOPLASTY WITH STENT PLACEMENT  1999   ESOPHAGOGASTRODUODENOSCOPY (EGD) WITH PROPOFOL N/A 02/08/2018   Procedure: ESOPHAGOGASTRODUODENOSCOPY (EGD) WITH PROPOFOL;  Surgeon: Toledo, Benay Pike, MD;  Location: [facility] ENDOSCOPY;  Service: Gastroenterology;  Laterality: N/A;   FEMORAL ARTERY STENT  08/2003   HERNIA REPAIR  2008   LEG AMPUTATION AT HIP Left 02/28/2019   MASTECTOMY     RENAL ARTERY STENT  01/6044   TRANSUMBILICAL AUGMENTATION MAMMAPLASTY     VASCULAR SURGERY     VISCERAL ARTERY INTERVENTION N/A 02/13/2020   Procedure: VISCERAL ARTERY INTERVENTION;  Surgeon: Algernon Huxley, MD;  Location: [place] [facility];  Service: Cardiovascular;  Laterality: N/A;    Social History Social History   Tobacco Use   Smoking status: Former    Types: Cigarettes    Quit date: 05/30/1994    Years since quitting: 26.9   Smokeless tobacco: Never  Vaping Use   Vaping Use: Never used  Substance Use Topics   Alcohol use: Not Currently    Comment: rare- 1 glass of wine EOW   Drug use: No    Family History Family History  Problem Relation Age of Onset   Hyperlipidemia Mother    Congestive Heart Failure Mother    COPD Mother    Heart disease Mother    Hypertension Mother    Osteoporosis Mother    Pancreatic cancer Father    Arthritis Sister    Alzheimer's disease Sister    Hypertension Sister    Prostate cancer Brother    Heart attack Brother    Stomach cancer Brother    Kidney failure Brother    Leukemia Brother    Emphysema Brother    Pancreatic cancer Brother    Stomach cancer Brother    Breast cancer Paternal Grandmother    Leukemia Paternal Grandfather    Heart attack Maternal Uncle    Stroke Maternal  Aunt     Allergies  Allergen Reactions   Amoxicillin Anaphylaxis   Naproxen Hives, Shortness Of Breath, Nausea And Vomiting and Swelling   Penicillins Shortness Of Breath, Swelling and Rash    Has patient had a PCN reaction causing immediate rash, facial/tongue/throat swelling, SOB or lightheadedness with hypotension: Yes Has patient had a PCN reaction causing severe rash involving mucus membranes or skin necrosis: Yes Has patient had a PCN reaction that required hospitalization: Yes Has patient had a PCN reaction occurring within the last 10 years: No  If all  of the above answers are "NO", then may proceed with Cephalosporin use.    Codeine Nausea And Vomiting   Levofloxacin Nausea And Vomiting   Shellfish Allergy Hives, Diarrhea, Nausea And Vomiting and Swelling     REVIEW OF SYSTEMS (Negative unless checked)  Constitutional: [] Weight loss  [] Fever  [] Chills Cardiac: [] Chest pain   [] Chest pressure   [] Palpitations   [] Shortness of breath when laying flat   [] Shortness of breath with exertion. Vascular:  [] Pain in legs with walking   [] Pain in legs at rest  [] History of DVT   [] Phlebitis   [] Swelling in legs   [] Varicose veins   [] Non-healing ulcers Pulmonary:   [] Uses home oxygen   [] Productive cough   [] Hemoptysis   [] Wheeze  [] COPD   [] Asthma Neurologic:  [] Dizziness   [] Seizures   [] History of stroke   [] History of TIA  [] Aphasia   [] Vissual changes   [] Weakness or numbness in arm   [] Weakness or numbness in leg Musculoskeletal:   [] Joint swelling   [] Joint pain   [] Low back pain Hematologic:  [] Easy bruising  [] Easy bleeding   [] Hypercoagulable state   [] Anemic Gastrointestinal:  [] Diarrhea   [] Vomiting  [x] Gastroesophageal reflux/heartburn   [] Difficulty swallowing. Genitourinary:  [] Chronic kidney disease   [] Difficult urination  [] Frequent urination   [] Blood in urine Skin:  [] Rashes   [] Ulcers  Psychological:  [] History of anxiety   []  History of major  depression.  Physical Examination  Vitals:   05/18/21 1014  BP: (!) 143/77  Pulse: 83  Weight: 125 lb (56.7 kg)  Height: 5' (1.524 m)   Body mass index is 24.41 kg/m. Gen: WD/WN, NAD Head: [place]/AT, No temporalis wasting.  Ear/Nose/Throat: Hearing grossly intact, nares w/o erythema or drainage Eyes: PER, EOMI, sclera nonicteric.  Neck: Supple, no masses.  No bruit or JVD.  Pulmonary:  Good air movement, no audible wheezing, no use of accessory muscles.  Cardiac: RRR, normal S1, S2, no Murmurs. Vascular:   bilateral carotid bruits noted Vessel Right Left  Radial Palpable Palpable  Carotid Palpable Palpable  Gastrointestinal: soft, non-distended. No guarding/no peritoneal signs.  Musculoskeletal: M/S 5/5 throughout.  No visible deformity.  Neurologic: CN 2-12 intact. Pain and light touch intact in extremities.  Symmetrical.  Speech is fluent. Motor exam as listed above. Psychiatric: Judgment intact, Mood & affect appropriate for pt's clinical situation. Dermatologic: No rashes or ulcers noted.  No changes consistent with cellulitis.   CBC Lab Results  Component Value Date   WBC 9.1 04/14/2021   HGB 13.9 04/14/2021   HCT 41.0 04/14/2021   MCV 94.3 04/14/2021   PLT 294 04/14/2021    BMET    Component Value Date/Time   NA 137 04/12/2021 1517   NA 137 07/22/2020 0000   NA 140 06/22/2014 0417   K 3.8 04/12/2021 1517   K 3.5 06/22/2014 0417   CL 101 04/12/2021 1517   CL 105 06/22/2014 0417   CO2 25 04/12/2021 1517   CO2 27 06/22/2014 0417   GLUCOSE 133 (H) 04/12/2021 1517   GLUCOSE 92 06/22/2014 0417   BUN 18 04/12/2021 1517   BUN 16 07/22/2020 0000   BUN 9 06/22/2014 0417   CREATININE 0.49 04/14/2021 0943   CREATININE 0.65 06/22/2014 0417   CALCIUM 9.2 04/12/2021 1517   CALCIUM 7.7 (L) 06/22/2014 0417   GFRNONAA >60 04/14/2021 0943   GFRNONAA >60 06/22/2014 0417   GFRNONAA >60 02/26/2013 1510   GFRAA 107 04/28/2020 1143   GFRAA >  60 06/22/2014 0417   GFRAA >60  02/26/2013 1510   CrCl cannot be calculated (Patient's most recent lab result is older than the maximum 21 days allowed.).  COAG Lab Results  Component Value Date   INR 0.8 02/12/2020   INR 0.9 05/17/2019   INR 1.0 03/23/2019    Radiology No results found.   Assessment/Plan 1. Mesenteric ischemia (HCC) Recommend:   The patient is status post successful angiogram with intervention of the mesenteric vessels.  SMA stent  was performed.  The patient reports that the abdominal pain is improved and the post prandial symptoms are essentially gone.   The patient denies lifestyle limiting changes at this point in time.   No further invasive studies, angiography or surgery at this time The patient should continue walking and begin a more formal exercise program. The patient should continue antiplatelet therapy and aggressive treatment of the lipid abnormalities   Patient should undergo noninvasive studies as ordered. The patient will follow up with me after the studies.  - VAS [place] MESENTERIC; Future  2. Bilateral carotid artery stenosis Recommend:   Given the patient's asymptomatic subcritical stenosis no further invasive testing or surgery at this time.   Duplex ultrasound shows <40% stenosis bilaterally s/p bilateral CEA.   Continue antiplatelet therapy as prescribed Continue management of CAD, HTN and Hyperlipidemia Healthy heart diet,  encouraged exercise at least 4 times per week Follow up in 6 months with duplex ultrasound and physical exam - VAS US CAROTID; Future  3. Peripheral vascular disease ([place]) Recommend:  I do not find evidence of life style limiting vascular disease. The patient specifically denies life style limitation.  Previous noninvasive studies including ABI's of the legs do not identify critical vascular problems.  The patient should continue walking and begin a more formal exercise program. The patient should continue his antiplatelet therapy and  aggressive treatment of the lipid abnormalities.   4. Essential (primary) hypertension Continue antihypertensive medications as already ordered, these medications have been reviewed and there are no changes at this time.   5. Gastroesophageal reflux disease, unspecified whether esophagitis present Continue PPI as already ordered, this medication has been reviewed and there are no changes at this time.  Avoidence of caffeine and alcohol  Moderate elevation of the head of the bed    6. Hypercholesteremia Continue statin as ordered and reviewed, no changes at this time    Hortencia Pilar, MD  05/18/2021 10:18 AM

## 2021-05-25 ENCOUNTER — Encounter (INDEPENDENT_AMBULATORY_CARE_PROVIDER_SITE_OTHER): Payer: Self-pay | Admitting: Vascular Surgery

## 2021-06-29 DIAGNOSIS — M7581 Other shoulder lesions, right shoulder: Secondary | ICD-10-CM | POA: Diagnosis not present

## 2021-06-29 DIAGNOSIS — M75111 Incomplete rotator cuff tear or rupture of right shoulder, not specified as traumatic: Secondary | ICD-10-CM | POA: Diagnosis not present

## 2021-07-21 ENCOUNTER — Ambulatory Visit: Payer: PPO | Admitting: Physician Assistant

## 2021-08-05 DIAGNOSIS — E042 Nontoxic multinodular goiter: Secondary | ICD-10-CM | POA: Diagnosis not present

## 2021-08-05 DIAGNOSIS — E0590 Thyrotoxicosis, unspecified without thyrotoxic crisis or storm: Secondary | ICD-10-CM | POA: Diagnosis not present

## 2021-08-10 DIAGNOSIS — Z85828 Personal history of other malignant neoplasm of skin: Secondary | ICD-10-CM | POA: Diagnosis not present

## 2021-08-10 DIAGNOSIS — D2261 Melanocytic nevi of right upper limb, including shoulder: Secondary | ICD-10-CM | POA: Diagnosis not present

## 2021-08-10 DIAGNOSIS — D225 Melanocytic nevi of trunk: Secondary | ICD-10-CM | POA: Diagnosis not present

## 2021-08-10 DIAGNOSIS — D2262 Melanocytic nevi of left upper limb, including shoulder: Secondary | ICD-10-CM | POA: Diagnosis not present

## 2021-08-12 DIAGNOSIS — E042 Nontoxic multinodular goiter: Secondary | ICD-10-CM | POA: Diagnosis not present

## 2021-08-12 DIAGNOSIS — E0590 Thyrotoxicosis, unspecified without thyrotoxic crisis or storm: Secondary | ICD-10-CM | POA: Diagnosis not present

## 2021-08-14 ENCOUNTER — Other Ambulatory Visit: Payer: Self-pay | Admitting: Family Medicine

## 2021-08-14 NOTE — Telephone Encounter (Signed)
Requested by interface surescripts. Last ordered 07/27/21 3 month supply ?Requested Prescriptions  ?Refused Prescriptions Disp Refills  ?? famotidine (PEPCID) 20 MG tablet [Pharmacy Med Name: FAMOTIDINE 20 MG TAB] 180 tablet 0  ?  Sig: TAKE 1 TABLET BY MOUTH TWICE A DAY  ?  ? Gastroenterology:  H2 Antagonists Passed - 08/14/2021  1:25 PM  ?  ?  Passed - Valid encounter within last 12 months  ?  Recent Outpatient Visits   ?      ? 3 months ago Essential (primary) hypertension  ? [facility], Dionne Bucy, MD  ? 3 months ago Vertigo  ? [facility] Thedore Mins, Ria Comment, [name]  ? 9 months ago Encounter for annual wellness visit (AWV) in Medicare patient  ? [facility] [name], Dionne Bucy, MD  ? 1 year ago Essential (primary) hypertension  ? [facility] [name], Dionne Bucy, MD  ? 1 year ago Annual physical exam  ? [facility], Dionne Bucy, MD  ?  ?  ?Future Appointments   ?        ? In 1 week [name], Dionne Bucy, MD [facility]  ?  ? ?  ?  ?  ? ?

## 2021-08-14 NOTE — Telephone Encounter (Signed)
Requested Prescriptions  ?Pending Prescriptions Disp Refills  ?? famotidine (PEPCID) 20 MG tablet [Pharmacy Med Name: FAMOTIDINE 20 MG TAB] 180 tablet 0  ?  Sig: TAKE 1 TABLET BY MOUTH TWICE A DAY  ?  ? Gastroenterology:  H2 Antagonists Passed - 08/14/2021  1:53 PM  ?  ?  Passed - Valid encounter within last 12 months  ?  Recent Outpatient Visits   ?      ? 3 months ago Essential (primary) hypertension  ? [facility], Dionne Bucy, MD  ? 3 months ago Vertigo  ? [facility], Ria Comment, [name]  ? 9 months ago Encounter for annual wellness visit (AWV) in Medicare patient  ? [facility] [name], Dionne Bucy, MD  ? 1 year ago Essential (primary) hypertension  ? [facility] [name], Dionne Bucy, MD  ? 1 year ago Annual physical exam  ? [facility], Dionne Bucy, MD  ?  ?  ?Future Appointments   ?        ? In 1 week [name], Dionne Bucy, MD [facility]  ?  ? ?  ?  ?  ? ?

## 2021-08-20 NOTE — Progress Notes (Signed)
?  ? ?I,Sulibeya S Dimas,acting as a scribe for Lavon Paganini, MD.,have documented all relevant documentation on the behalf of Lavon Paganini, MD,as directed by  Lavon Paganini, MD while in the presence of Lavon Paganini, MD. ? ? ?Established patient visit ? ? ?Patient: Misty Page   DOB: 06-04-1940   81 y.o. Female  MRN: [id number] ?Visit Date: 08/21/2021 ? ?Today's healthcare provider: Lavon Paganini, MD  ? ?Chief Complaint  ?Patient presents with  ? Hypertension  ? ?Subjective  ?  ?HPI  ?Hypertension, follow-up ? ?BP Readings from Last 3 Encounters:  ?08/21/21 122/80  ?05/18/21 (!) 143/77  ?05/04/21 140/70  ? Wt Readings from Last 3 Encounters:  ?08/21/21 130 lb (59 kg)  ?05/18/21 125 lb (56.7 kg)  ?04/12/21 126 lb (57.2 kg)  ?  ? ?She was last seen for hypertension 3 months ago.  ?BP at that visit was 140/70. Management since that visit includes continue amlodipine 38m daily and increase losartan to 1097mdaily. ? ?She reports excellent compliance with treatment. ?She is not having side effects.  ?She is following a Regular, Low Sodium diet. ?She is not exercising. ?She does not smoke. ? ?Use of agents associated with hypertension: none.  ? ?Outside blood pressures are not being checked. ?Symptoms: ?No chest pain No chest pressure  ?No palpitations No syncope  ?No dyspnea No orthopnea  ?No paroxysmal nocturnal dyspnea Yes lower extremity edema  ? ?Pertinent labs: ?Lab Results  ?Component Value Date  ? CHOL 193 05/08/2021  ? HDL 65 05/08/2021  ? LDLCALC 114 (H) 05/08/2021  ? TRIG 76 05/08/2021  ? CHOLHDL 3.0 05/08/2021  ? Lab Results  ?Component Value Date  ? NA 137 04/12/2021  ? K 3.8 04/12/2021  ? CREATININE 0.49 04/14/2021  ? GFRNONAA >60 04/14/2021  ? GLUCOSE 133 (H) 04/12/2021  ? TSH 0.22 (A) 08/06/2020  ?  ? ?The ASCVD Risk score (Arnett DK, et al., 2019) failed to calculate for the following reasons: ?  The 2019 ASCVD risk score is only valid for ages 4038o 7976  The patient has a prior MI  or stroke diagnosis  ? ?--------------------------------------------------------------------------------------------------- ? ? ?Medications: ?Outpatient Medications Prior to Visit  ?Medication Sig  ? amLODipine (NORVASC) 5 MG tablet Take 1 tablet (5 mg total) by mouth daily.  ? Ascorbic Acid (VITAMIN C) 1000 MG tablet Take 1,000 mg by mouth daily.  ? aspirin EC 81 MG EC tablet Take 1 tablet (81 mg total) by mouth daily. Swallow whole.  ? Calcium-Vitamin D-Vitamin K 500-100-40 MG-UNT-MCG CHEW Chew 1 tablet by mouth 2 (two) times daily. VIACTIV, 500-100-40 (Oral Tablet Chewable)  1 tablet twice a day for 0 days  Quantity: 0.00;  Refills: 0   Ordered :13-Apr-2010  AlCelene KrasMA, Anastasiya ;  Started 29-October-2008 Active Comments: DX: 733.00  ? clopidogrel (PLAVIX) 75 MG tablet TAKE 1 TABLET BY MOUTH DAILY  ? Cranberry-Cholecalciferol 4200-500 MG-UNIT CAPS Take by mouth.  ? ezetimibe (ZETIA) 10 MG tablet Take 1 tablet (10 mg total) by mouth daily.  ? famotidine (PEPCID) 20 MG tablet TAKE 1 TABLET BY MOUTH TWICE A DAY  ? ferrous sulfate 325 (65 FE) MG EC tablet Take 325 mg by mouth daily.  ? fexofenadine (ALLEGRA) 180 MG tablet Take 180 mg by mouth at bedtime as needed.  ? losartan (COZAAR) 100 MG tablet Take 1 tablet (100 mg total) by mouth daily.  ? meclizine (ANTIVERT) 25 MG tablet Take 1 tablet (25 mg total) by mouth 3 (  three) times daily as needed for dizziness or nausea.  ? Mesalamine 800 MG TBEC Take 800 mg by mouth 2 (two) times daily.  ? methimazole (TAPAZOLE) 5 MG tablet Take 2.5 mg by mouth daily.   ? ondansetron (ZOFRAN ODT) 4 MG disintegrating tablet Take 1 tablet (4 mg total) by mouth every 8 (eight) hours as needed for nausea or vomiting.  ? Probiotic Product (PROBIOTIC DAILY PO) Take 1 capsule by mouth daily.  ? rosuvastatin (CRESTOR) 10 MG tablet Take 1 tablet (10 mg total) by mouth daily.  ? [DISCONTINUED] fluticasone (FLONASE) 50 MCG/ACT nasal spray USE 2 SPRAYS EACH NOSTRIL DAILY (Patient taking  differently: Place 2 sprays into both nostrils daily as needed.)  ? promethazine (PHENERGAN) 6.25 MG/5ML syrup Take 5 mLs (6.25 mg total) by mouth every 8 (eight) hours as needed for up to 7 days for nausea or vomiting.  ? ?No facility-administered medications prior to visit.  ? ? ?Review of Systems  ?Respiratory:  Negative for chest tightness and shortness of breath.   ?Cardiovascular:  Negative for chest pain and palpitations.  ? ? ?  Objective  ?  ?BP 122/80 (BP Location: Right Arm, Patient Position: Sitting, Cuff Size: Large)   Pulse 81   Temp 98.1 ?F (36.7 ?C) (Temporal)   Resp 16   Wt 130 lb (59 kg)   BMI 25.39 kg/m?  ? ? ?Physical Exam ?Vitals reviewed.  ?Constitutional:   ?   General: She is not in acute distress. ?   Appearance: Normal appearance. She is well-developed. She is not diaphoretic.  ?HENT:  ?   Head: Normocephalic and atraumatic.  ?Eyes:  ?   General: No scleral icterus. ?   Conjunctiva/sclera: Conjunctivae normal.  ?Neck:  ?   Thyroid: No thyromegaly.  ?Cardiovascular:  ?   Rate and Rhythm: Normal rate and regular rhythm.  ?   Heart sounds: Murmur heard.  ?Pulmonary:  ?   Effort: Pulmonary effort is normal. No respiratory distress.  ?   Breath sounds: Normal breath sounds. No wheezing, rhonchi or rales.  ?Musculoskeletal:  ?   Cervical back: Neck supple.  ?   Right lower leg: No edema.  ?Lymphadenopathy:  ?   Cervical: No cervical adenopathy.  ?Skin: ?   General: Skin is warm and dry.  ?   Findings: No rash.  ?Neurological:  ?   Mental Status: She is alert and oriented to person, place, and time. Mental status is at baseline.  ?Psychiatric:     ?   Mood and Affect: Mood normal.     ?   Behavior: Behavior normal.  ?  ? ? ?No results found for any visits on 08/21/21. ? Assessment & Plan  ?  ? ?Problem List Items Addressed This Visit   ? ?  ? Cardiovascular and Mediastinum  ? Essential (primary) hypertension - Primary  ?  Well controlled ?Continue current medications ?Reviewed recent  metabolic panel ?F/u in 3 months  ?  ?  ?  ? Respiratory  ? Allergic rhinitis, seasonal  ?  Worsening recently ?Will resume allegra and flonase ?rx for flonase sent today ?  ?  ?  ? ?Return in about 3 months (around 11/21/2021) for CPE, AWV.  ?   ? ?I, Lavon Paganini, MD, have reviewed all documentation for this visit. The documentation on 08/21/21 for the exam, diagnosis, procedures, and orders are all accurate and complete. ? ? ?Virginia Crews, MD, MPH ?[place] [facility]   ?

## 2021-08-21 ENCOUNTER — Ambulatory Visit (INDEPENDENT_AMBULATORY_CARE_PROVIDER_SITE_OTHER): Payer: PPO | Admitting: Family Medicine

## 2021-08-21 ENCOUNTER — Other Ambulatory Visit: Payer: Self-pay

## 2021-08-21 ENCOUNTER — Encounter: Payer: Self-pay | Admitting: Family Medicine

## 2021-08-21 VITALS — BP 122/80 | HR 81 | Temp 98.1°F | Resp 16 | Wt 130.0 lb

## 2021-08-21 DIAGNOSIS — J302 Other seasonal allergic rhinitis: Secondary | ICD-10-CM | POA: Diagnosis not present

## 2021-08-21 DIAGNOSIS — I10 Essential (primary) hypertension: Secondary | ICD-10-CM | POA: Diagnosis not present

## 2021-08-21 MED ORDER — FLUTICASONE PROPIONATE 50 MCG/ACT NA SUSP: NASAL | 3 refills | Status: DC

## 2021-08-21 NOTE — Assessment & Plan Note (Signed)
Well controlled ?Continue current medications ?Reviewed recent metabolic panel ?F/u in 3 months  ?

## 2021-08-21 NOTE — Assessment & Plan Note (Signed)
Worsening recently ?Will resume allegra and flonase ?rx for flonase sent today ?

## 2021-08-27 DIAGNOSIS — I480 Paroxysmal atrial fibrillation: Secondary | ICD-10-CM | POA: Diagnosis not present

## 2021-08-27 DIAGNOSIS — I10 Essential (primary) hypertension: Secondary | ICD-10-CM | POA: Diagnosis not present

## 2021-08-27 DIAGNOSIS — Z95820 Peripheral vascular angioplasty status with implants and grafts: Secondary | ICD-10-CM | POA: Diagnosis not present

## 2021-08-27 DIAGNOSIS — Z7982 Long term (current) use of aspirin: Secondary | ICD-10-CM | POA: Diagnosis not present

## 2021-08-27 DIAGNOSIS — D692 Other nonthrombocytopenic purpura: Secondary | ICD-10-CM | POA: Diagnosis not present

## 2021-08-27 DIAGNOSIS — I25118 Atherosclerotic heart disease of native coronary artery with other forms of angina pectoris: Secondary | ICD-10-CM | POA: Diagnosis not present

## 2021-08-27 DIAGNOSIS — D6869 Other thrombophilia: Secondary | ICD-10-CM | POA: Diagnosis not present

## 2021-08-27 DIAGNOSIS — I700 Atherosclerosis of aorta: Secondary | ICD-10-CM | POA: Diagnosis not present

## 2021-08-27 DIAGNOSIS — Z7902 Long term (current) use of antithrombotics/antiplatelets: Secondary | ICD-10-CM | POA: Diagnosis not present

## 2021-08-27 DIAGNOSIS — Z89612 Acquired absence of left leg above knee: Secondary | ICD-10-CM | POA: Diagnosis not present

## 2021-08-27 DIAGNOSIS — K5190 Ulcerative colitis, unspecified, without complications: Secondary | ICD-10-CM | POA: Diagnosis not present

## 2021-08-27 DIAGNOSIS — I739 Peripheral vascular disease, unspecified: Secondary | ICD-10-CM | POA: Diagnosis not present

## 2021-09-28 DIAGNOSIS — M75111 Incomplete rotator cuff tear or rupture of right shoulder, not specified as traumatic: Secondary | ICD-10-CM | POA: Diagnosis not present

## 2021-09-28 DIAGNOSIS — M7581 Other shoulder lesions, right shoulder: Secondary | ICD-10-CM | POA: Diagnosis not present

## 2021-10-12 ENCOUNTER — Other Ambulatory Visit: Payer: Self-pay | Admitting: Family Medicine

## 2021-10-27 ENCOUNTER — Other Ambulatory Visit: Payer: Self-pay | Admitting: Family Medicine

## 2021-10-27 DIAGNOSIS — I10 Essential (primary) hypertension: Secondary | ICD-10-CM

## 2021-11-09 ENCOUNTER — Other Ambulatory Visit: Payer: Self-pay | Admitting: Family Medicine

## 2021-11-16 ENCOUNTER — Ambulatory Visit
Admission: RE | Admit: 2021-11-16 | Discharge: 2021-11-16 | Disposition: A | Discharge status: Home / Self Care | Payer: PPO | Source: Ambulatory Visit | Attending: Family Medicine | Admitting: Family Medicine

## 2021-11-16 ENCOUNTER — Ambulatory Visit (INDEPENDENT_AMBULATORY_CARE_PROVIDER_SITE_OTHER): Payer: PPO | Admitting: Family Medicine

## 2021-11-16 ENCOUNTER — Ambulatory Visit: Payer: Self-pay | Admitting: *Deleted

## 2021-11-16 ENCOUNTER — Encounter: Payer: Self-pay | Admitting: Family Medicine

## 2021-11-16 VITALS — BP 120/80 | HR 79 | Resp 16 | Wt 129.0 lb

## 2021-11-16 DIAGNOSIS — M79661 Pain in right lower leg: Secondary | ICD-10-CM | POA: Diagnosis not present

## 2021-11-16 DIAGNOSIS — M7989 Other specified soft tissue disorders: Secondary | ICD-10-CM

## 2021-11-16 DIAGNOSIS — L03115 Cellulitis of right lower limb: Secondary | ICD-10-CM

## 2021-11-16 DIAGNOSIS — I2510 Atherosclerotic heart disease of native coronary artery without angina pectoris: Secondary | ICD-10-CM

## 2021-11-16 DIAGNOSIS — C499 Malignant neoplasm of connective and soft tissue, unspecified: Secondary | ICD-10-CM

## 2021-11-16 MED ORDER — DOXYCYCLINE HYCLATE 100 MG PO TABS: 100.0000 mg | ORAL_TABLET | Freq: Two times a day (BID) | ORAL | 1 refills | Status: DC

## 2021-11-16 NOTE — Progress Notes (Unsigned)
Established patient visit  I,Misty Page,acting as a scribe for Misty Durie, MD.,have documented all relevant documentation on the behalf of Misty Durie, MD,as directed by  Misty Durie, MD while in the presence of Misty Durie, MD.   Patient: Misty Page   DOB: 03-21-1941   81 y.o. Female  MRN: [id number] Visit Date: 11/16/2021  Today's healthcare provider: Wilhemena Durie, MD   Chief Complaint  Patient presents with   Leg Swelling   Subjective    HPI  Patient has had right lower leg swelling and redness for 2 weeks. Patient states her leg is painful to the touch. Right lower leg was swollen, red and hot to the touch this morning. Patient has also had symptoms of vertigo. Patient she has been feeling lightheaded all weekend.   Medications: Outpatient Medications Prior to Visit  Medication Sig   amLODipine (NORVASC) 5 MG tablet Take 1 tablet (5 mg total) by mouth daily.   Ascorbic Acid (VITAMIN C) 1000 MG tablet Take 1,000 mg by mouth daily.   aspirin EC 81 MG EC tablet Take 1 tablet (81 mg total) by mouth daily. Swallow whole.   Calcium-Vitamin D-Vitamin K 500-100-40 MG-UNT-MCG CHEW Chew 1 tablet by mouth 2 (two) times daily. VIACTIV, 500-100-40 (Oral Tablet Chewable)  1 tablet twice a day for 0 days  Quantity: 0.00;  Refills: 0   Ordered :13-Apr-2010  Karoline Caldwell, Anastasiya ;  Started 29-October-2008 Active Comments: DX: 733.00   clopidogrel (PLAVIX) 75 MG tablet TAKE 1 TABLET BY MOUTH DAILY   Cranberry-Cholecalciferol 4200-500 MG-UNIT CAPS Take by mouth.   ezetimibe (ZETIA) 10 MG tablet Take 1 tablet (10 mg total) by mouth daily.   famotidine (PEPCID) 20 MG tablet TAKE 1 TABLET BY MOUTH TWICE A DAY   ferrous sulfate 325 (65 FE) MG EC tablet Take 325 mg by mouth daily.   fexofenadine (ALLEGRA) 180 MG tablet Take 180 mg by mouth at bedtime as needed.   fluticasone (FLONASE) 50 MCG/ACT nasal spray USE 2 SPRAYS EACH NOSTRIL DAILY   losartan  (COZAAR) 100 MG tablet TAKE 1 TABLET BY MOUTH DAILY   meclizine (ANTIVERT) 25 MG tablet Take 1 tablet (25 mg total) by mouth 3 (three) times daily as needed for dizziness or nausea.   Mesalamine 800 MG TBEC Take 800 mg by mouth 2 (two) times daily.   methimazole (TAPAZOLE) 5 MG tablet Take 2.5 mg by mouth daily.    ondansetron (ZOFRAN ODT) 4 MG disintegrating tablet Take 1 tablet (4 mg total) by mouth every 8 (eight) hours as needed for nausea or vomiting.   Probiotic Product (PROBIOTIC DAILY PO) Take 1 capsule by mouth daily.   rosuvastatin (CRESTOR) 10 MG tablet Take 1 tablet (10 mg total) by mouth daily.   rosuvastatin (CRESTOR) 20 MG tablet TAKE ONE-HALF TABLET BY MOUTH DAILY   promethazine (PHENERGAN) 6.25 MG/5ML syrup Take 5 mLs (6.25 mg total) by mouth every 8 (eight) hours as needed for up to 7 days for nausea or vomiting.   No facility-administered medications prior to visit.    Review of Systems  Constitutional:  Negative for appetite change, chills, fatigue and fever.  Respiratory:  Negative for chest tightness and shortness of breath.   Cardiovascular:  Negative for chest pain and palpitations.  Gastrointestinal:  Negative for abdominal pain, nausea and vomiting.  Neurological:  Negative for dizziness and weakness.    {Labs  Heme  Chem  Endocrine  Serology  Results Review (optional):23779}   Objective    BP 120/80 (BP Location: Right Arm, Patient Position: Sitting, Cuff Size: Normal)   Pulse 79   Resp 16   Wt 129 lb (58.5 kg)   SpO2 97%   BMI 25.19 kg/m  {Show previous vital signs (optional):23777}  Physical Exam  ***  No results found for any visits on 11/16/21.  Assessment & Plan     ***  No follow-ups on file.      {provider attestation***:1}   Misty Durie, MD  [facility] [phone] (phone) [phone] (fax)  [place]

## 2021-11-16 NOTE — Telephone Encounter (Signed)
  Chief Complaint: R leg swelling Symptoms: swelling to knee, red, painful Frequency: 2 weeks Pertinent Negatives: Patient denies fever, SOB, chest pain Disposition: [] ED /[] Urgent Care (no appt availability in office) / [] Appointment(In office/virtual)/ []  [place] [facility]/ [] Home Care/ [] Refused Recommended Disposition /[] [place] [facility]/ []  Follow-up with PCP Additional Notes: Call to office for appointment - they are going to contact patient   Reason for Disposition  [1] MODERATE leg swelling (e.g., swelling extends up to knees) AND [2] new-onset or worsening  Answer Assessment - Initial Assessment Questions 1. ONSET: "When did the swelling start?" (e.g., minutes, hours, days)     2 weeks ago 2. LOCATION: "What part of the leg is swollen?"  "Are both legs swollen or just one leg?"     Right leg swelling 3. SEVERITY: "How bad is the swelling?" (e.g., localized; mild, moderate, severe)  - Localized - small area of swelling localized to one leg  - MILD pedal edema - swelling limited to foot and ankle, pitting edema < 1/4 inch (6 mm) deep, rest and elevation eliminate most or all swelling  - MODERATE edema - swelling of lower leg to knee, pitting edema > 1/4 inch (6 mm) deep, rest and elevation only partially reduce swelling  - SEVERE edema - swelling extends above knee, facial or hand swelling present      moderate 4. REDNESS: "Does the swelling look red or infected?"     red 5. PAIN: "Is the swelling painful to touch?" If Yes, ask: "How painful is it?"   (Scale 1-10; mild, moderate or severe)     yes 6. FEVER: "Do you have a fever?" If Yes, ask: "What is it, how was it measured, and when did it start?"      no 7. CAUSE: "What do you think is causing the leg swelling?"     unsure 8. MEDICAL HISTORY: "Do you have a history of heart failure, kidney disease, liver failure, or cancer?"       9. RECURRENT SYMPTOM: "Have you had leg swelling before?" If Yes, ask: "When was  the last time?" "What happened that time?"     no 10. OTHER SYMPTOMS: "Do you have any other symptoms?" (e.g., chest pain, difficulty breathing)       no 11. PREGNANCY: "Is there any chance you are pregnant?" "When was your last menstrual period?"  Protocols used: Leg Swelling and Edema-A-AH

## 2021-11-20 ENCOUNTER — Other Ambulatory Visit: Payer: Self-pay | Admitting: Family Medicine

## 2021-11-20 ENCOUNTER — Ambulatory Visit: Payer: Self-pay | Admitting: *Deleted

## 2021-11-20 MED ORDER — SULFAMETHOXAZOLE-TRIMETHOPRIM 800-160 MG PO TABS: 1.0000 | ORAL_TABLET | Freq: Two times a day (BID) | ORAL | 0 refills | Status: DC

## 2021-11-23 ENCOUNTER — Other Ambulatory Visit: Payer: Self-pay

## 2021-11-23 ENCOUNTER — Ambulatory Visit (INDEPENDENT_AMBULATORY_CARE_PROVIDER_SITE_OTHER): Payer: PPO

## 2021-11-23 ENCOUNTER — Encounter (INDEPENDENT_AMBULATORY_CARE_PROVIDER_SITE_OTHER): Payer: Self-pay | Admitting: Vascular Surgery

## 2021-11-23 ENCOUNTER — Other Ambulatory Visit: Payer: Self-pay | Admitting: Family Medicine

## 2021-11-23 ENCOUNTER — Ambulatory Visit (INDEPENDENT_AMBULATORY_CARE_PROVIDER_SITE_OTHER): Payer: PPO | Admitting: Vascular Surgery

## 2021-11-23 ENCOUNTER — Telehealth: Payer: Self-pay

## 2021-11-23 VITALS — BP 177/90 | HR 73 | Resp 16

## 2021-11-23 DIAGNOSIS — E7800 Pure hypercholesterolemia, unspecified: Secondary | ICD-10-CM | POA: Diagnosis not present

## 2021-11-23 DIAGNOSIS — I2510 Atherosclerotic heart disease of native coronary artery without angina pectoris: Secondary | ICD-10-CM | POA: Diagnosis not present

## 2021-11-23 DIAGNOSIS — K559 Vascular disorder of intestine, unspecified: Secondary | ICD-10-CM

## 2021-11-23 DIAGNOSIS — I10 Essential (primary) hypertension: Secondary | ICD-10-CM | POA: Diagnosis not present

## 2021-11-23 DIAGNOSIS — I6523 Occlusion and stenosis of bilateral carotid arteries: Secondary | ICD-10-CM

## 2021-11-23 MED ORDER — SULFAMETHOXAZOLE-TRIMETHOPRIM 800-160 MG PO TABS: 1.0000 | ORAL_TABLET | Freq: Two times a day (BID) | ORAL | 0 refills | Status: DC

## 2021-11-30 ENCOUNTER — Ambulatory Visit: Payer: Self-pay | Admitting: *Deleted

## 2021-11-30 MED ORDER — SULFAMETHOXAZOLE-TRIMETHOPRIM 800-160 MG PO TABS: 1.0000 | ORAL_TABLET | Freq: Two times a day (BID) | ORAL | 0 refills | Status: AC

## 2021-11-30 NOTE — Telephone Encounter (Signed)
Patient advised additional 3 days of ABX sent to pharmacy. Advised to call to re-evaluated if symptoms do not resolve after the additional 3 days of Bactrim. Patient verbalized understanding.

## 2021-11-30 NOTE — Telephone Encounter (Addendum)
  Chief Complaint: cellulitis is not quite well Symptoms: still swollen toward bottom of leg, painful Frequency: constant Pertinent Negatives: Patient denies fever. Disposition: [] ED /[] Urgent Care (no appt availability in office) / [x] Appointment(In office/virtual)/ []  [place] [facility]/ [] Home Care/ [] Refused Recommended Disposition /[] [place] [facility]/ []  Follow-up with PCP Additional Notes: I tried to get pt to accept an appt but she is not interested in that. She was seen two weeks ago. She is finished with antibiotic and the leg is better but not completely gone. She has swollen are that is painful from 7" below knee to foot. She wants to talk to Dr. Brita Romp to make sure this is normal. She is afraid it is not quite well and needs more antibiotics. Again, refused appt.   Reason for Disposition  [1] MILD swelling of both ankles (i.e., pedal edema) AND [2] is a chronic symptom (recurrent or ongoing AND present > 4 weeks)  Answer Assessment - Initial Assessment Questions 1. ONSET: "When did the swelling start?" (e.g., minutes, hours, days)     Getting better but now finished med and been like this one month 2. LOCATION: "What part of the leg is swollen?"  "Are both legs swollen or just one leg?"     From 7" below knee to foot 3. SEVERITY: "How bad is the swelling?" (e.g., localized; mild, moderate, severe)  - Localized - small area of swelling localized to one leg  - MILD pedal edema - swelling limited to foot and ankle, pitting edema < 1/4 inch (6 mm) deep, rest and elevation eliminate most or all swelling  - MODERATE edema - swelling of lower leg to knee, pitting edema > 1/4 inch (6 mm) deep, rest and elevation only partially reduce swelling  - SEVERE edema - swelling extends above knee, facial or hand swelling present      When up 4. REDNESS: "Does the swelling look red or infected?"     no 5. PAIN: "Is the swelling painful to touch?" If Yes, ask: "How painful is it?"    (Scale 1-10; mild, moderate or severe)     5 6. FEVER: "Do you have a fever?" If Yes, ask: "What is it, how was it measured, and when did it start?"      no 7. CAUSE: "What do you think is causing the leg swelling?"     cellulitis  Protocols used: Leg Swelling and Edema-A-AH

## 2021-11-30 NOTE — Telephone Encounter (Signed)
Dopplers were negative for DVT

## 2021-11-30 NOTE — Telephone Encounter (Signed)
Since cellulitis is improving, finish ABX course as prescribed for Bactrim. Add additional 3 days of Bactrim ds 1tab bid if not resolved when she finishes it

## 2021-11-30 NOTE — Addendum Note (Signed)
Addended by: Shawna Orleans on: 11/30/2021 01:12 PM   Modules accepted: Orders

## 2021-12-16 ENCOUNTER — Encounter: Payer: Self-pay | Admitting: Physician Assistant

## 2021-12-16 ENCOUNTER — Ambulatory Visit (INDEPENDENT_AMBULATORY_CARE_PROVIDER_SITE_OTHER): Payer: PPO | Admitting: Physician Assistant

## 2021-12-16 ENCOUNTER — Ambulatory Visit
Admission: RE | Admit: 2021-12-16 | Discharge: 2021-12-16 | Disposition: A | Discharge status: Home / Self Care | Payer: PPO | Source: Ambulatory Visit | Attending: Physician Assistant | Admitting: Physician Assistant

## 2021-12-16 ENCOUNTER — Ambulatory Visit
Admission: RE | Admit: 2021-12-16 | Discharge: 2021-12-16 | Disposition: A | Discharge status: Home / Self Care | Payer: PPO | Attending: Family Medicine | Admitting: Family Medicine

## 2021-12-16 VITALS — BP 141/61 | HR 75 | Temp 98.8°F | Resp 16 | Wt 130.0 lb

## 2021-12-16 DIAGNOSIS — R918 Other nonspecific abnormal finding of lung field: Secondary | ICD-10-CM | POA: Diagnosis not present

## 2021-12-16 DIAGNOSIS — R0989 Other specified symptoms and signs involving the circulatory and respiratory systems: Secondary | ICD-10-CM | POA: Diagnosis not present

## 2021-12-16 DIAGNOSIS — L03115 Cellulitis of right lower limb: Secondary | ICD-10-CM | POA: Diagnosis not present

## 2021-12-16 DIAGNOSIS — M7989 Other specified soft tissue disorders: Secondary | ICD-10-CM | POA: Insufficient documentation

## 2021-12-16 MED ORDER — CLINDAMYCIN HCL 300 MG PO CAPS: 300.0000 mg | ORAL_CAPSULE | Freq: Three times a day (TID) | ORAL | 0 refills | Status: DC

## 2021-12-16 NOTE — Progress Notes (Signed)
Established patient visit  I,Joseline E Rosas,acting as a scribe for [employer], [name].,have documented all relevant documentation on the behalf of Mardene Speak, [name],as directed by  [employer], [name] while in the presence of [employer], [name].   Patient: Misty Page   DOB: May 13, 1941   81 y.o. Female  MRN: [id number] Visit Date: 12/16/2021  Today's healthcare provider: Mardene Speak, [name]   Chief Complaint  Patient presents with   Cellulitis   Subjective    HPI  Edema: Patient complains of edema. The location of the edema is lower leg(s) right. This is something recurrent. Patient was seen in office for this 11/16/2021 treated with Doxycycline.she had an US DVT done which was normal.    Medications: Outpatient Medications Prior to Visit  Medication Sig   amLODipine (NORVASC) 5 MG tablet TAKE 1 TABLET BY MOUTH DAILY   Ascorbic Acid (VITAMIN C) 1000 MG tablet Take 1,000 mg by mouth daily.   clopidogrel (PLAVIX) 75 MG tablet TAKE 1 TABLET BY MOUTH DAILY   ezetimibe (ZETIA) 10 MG tablet TAKE 1 TABLET BY MOUTH DAILY   famotidine (PEPCID) 20 MG tablet TAKE 1 TABLET BY MOUTH TWICE A DAY   ferrous sulfate 325 (65 FE) MG EC tablet Take 325 mg by mouth daily.   fluticasone (FLONASE) 50 MCG/ACT nasal spray USE 2 SPRAYS EACH NOSTRIL DAILY   losartan (COZAAR) 100 MG tablet TAKE 1 TABLET BY MOUTH DAILY   Mesalamine 800 MG TBEC Take 800 mg by mouth 2 (two) times daily.   methimazole (TAPAZOLE) 5 MG tablet Take 2.5 mg by mouth daily.    Probiotic Product (PROBIOTIC DAILY PO) Take 1 capsule by mouth daily.   rosuvastatin (CRESTOR) 20 MG tablet TAKE ONE-HALF TABLET BY MOUTH DAILY   aspirin EC 81 MG EC tablet Take 1 tablet (81 mg total) by mouth daily. Swallow whole.   Calcium-Vitamin D-Vitamin K 500-100-40 MG-UNT-MCG CHEW Chew 1 tablet by mouth 2 (two) times daily. VIACTIV, 500-100-40 (Oral Tablet Chewable)  1 tablet twice a day for 0 days  Quantity: 0.00;  Refills: 0   Ordered  :13-Apr-2010  Celene Kras, MA, Anastasiya ;  Started 29-October-2008 Active Comments: DX: 733.00   Cranberry-Cholecalciferol 4200-500 MG-UNIT CAPS Take by mouth.   fexofenadine (ALLEGRA) 180 MG tablet Take 180 mg by mouth at bedtime as needed. (Patient not taking: Reported on 12/16/2021)   meclizine (ANTIVERT) 25 MG tablet Take 1 tablet (25 mg total) by mouth 3 (three) times daily as needed for dizziness or nausea. (Patient not taking: Reported on 12/16/2021)   ondansetron (ZOFRAN ODT) 4 MG disintegrating tablet Take 1 tablet (4 mg total) by mouth every 8 (eight) hours as needed for nausea or vomiting. (Patient not taking: Reported on 12/16/2021)   promethazine (PHENERGAN) 6.25 MG/5ML syrup Take 5 mLs (6.25 mg total) by mouth every 8 (eight) hours as needed for up to 7 days for nausea or vomiting.   rosuvastatin (CRESTOR) 10 MG tablet Take 1 tablet (10 mg total) by mouth daily.   No facility-administered medications prior to visit.    Review of Systems  All other systems reviewed and are negative. Exept see HPI     Objective    BP (!) 141/61 (BP Location: Right Arm, Patient Position: Sitting, Cuff Size: Normal)   Pulse 75   Temp 98.8 F (37.1 C) (Oral)   Resp 16   Wt 130 lb (59 kg)   BMI 25.39 kg/m    Physical Exam Vitals  reviewed.  Constitutional:      General: She is not in acute distress.    Appearance: Normal appearance. She is well-developed and normal weight. She is not diaphoretic.  HENT:     Head: Normocephalic and atraumatic.  Eyes:     General: No scleral icterus.    Conjunctiva/sclera: Conjunctivae normal.  Neck:     Thyroid: No thyromegaly.  Cardiovascular:     Rate and Rhythm: Normal rate and regular rhythm.     Pulses: Normal pulses.     Heart sounds: Normal heart sounds. No murmur heard. Pulmonary:     Effort: Pulmonary effort is normal. No respiratory distress.     Breath sounds: Rales present. No wheezing or rhonchi.  Abdominal:     General: Abdomen is flat.  Bowel sounds are normal.     Palpations: Abdomen is soft.  Musculoskeletal:        General: Tenderness (r leg) present.     Cervical back: Neck supple.     Right lower leg: Edema present.     Left lower leg: Edema present.  Lymphadenopathy:     Cervical: No cervical adenopathy.  Skin:    General: Skin is warm and dry.     Findings: Erythema and rash present.  Neurological:     Mental Status: She is alert and oriented to person, place, and time. Mental status is at baseline.  Psychiatric:        Behavior: Behavior normal.        Thought Content: Thought content normal.        Judgment: Judgment normal.       No results found for any visits on 12/16/21.  Assessment & Plan     1. Leg swelling, left With abnormal lung exam and chronic SOB - C-reactive protein - Sed Rate (ESR) - Pro b natriuretic peptide (BNP)[facility]) - DG Chest 2 View; Future - CBC w/Diff/Platelet  2. Lung field abnormal finding on examination With chronic SOB - Pro b natriuretic peptide (BNP)[facility]) - DG Chest 2 View; Future  3. Cellulitis of right lower extremity With swelling, warmth, erythema Completed 2 courses of abx: doxy, bactrim up to 10-14 d Started with unknown rash. - C-reactive protein - Sed Rate (ESR) - CBC w/Diff/Platelet - clindamycin (CLEOCIN) 300 MG capsule; Take 1 capsule (300 mg total) by mouth 3 (three) times daily.  Dispense: 21 capsule; Refill: 0  FU as scheduled  The patient was advised to call back or seek an in-person evaluation if the symptoms worsen or if the condition fails to improve as anticipated.  I discussed the assessment and treatment plan with the patient. The patient was provided an opportunity to ask questions and all were answered. The patient agreed with the plan and demonstrated an understanding of the instructions.  The entirety of the information documented in the History of Present Illness, Review of  Systems and Physical Exam were personally obtained by me. Portions of this information were initially documented by the CMA and reviewed by me for thoroughness and accuracy.  Portions of this note were created using dictation software and may contain typographical errors.       Total encounter time more than 30 minutes  Greater than 50% was spent in counseling and coordination of care with the patient  Elberta Leatherwood  [facility] [phone] (phone) [phone] (fax)  [place]

## 2021-12-17 LAB — CBC WITH DIFFERENTIAL/PLATELET
Basophils Absolute: 0 10*3/uL (ref 0.0–0.2)
Basos: 1 %
EOS (ABSOLUTE): 0.3 10*3/uL (ref 0.0–0.4)
Eos: 4 %
Hematocrit: 40.2 % (ref 34.0–46.6)
Hemoglobin: 13.2 g/dL (ref 11.1–15.9)
Immature Grans (Abs): 0 10*3/uL (ref 0.0–0.1)
Immature Granulocytes: 0 %
Lymphocytes Absolute: 0.8 10*3/uL (ref 0.7–3.1)
Lymphs: 11 %
MCH: 31 pg (ref 26.6–33.0)
MCHC: 32.8 g/dL (ref 31.5–35.7)
MCV: 94 fL (ref 79–97)
Monocytes Absolute: 0.7 10*3/uL (ref 0.1–0.9)
Monocytes: 9 %
Neutrophils Absolute: 5.6 10*3/uL (ref 1.4–7.0)
Neutrophils: 75 %
Platelets: 350 10*3/uL (ref 150–450)
RBC: 4.26 x10E6/uL (ref 3.77–5.28)
RDW: 13 % (ref 11.7–15.4)
WBC: 7.5 10*3/uL (ref 3.4–10.8)

## 2021-12-17 LAB — C-REACTIVE PROTEIN: CRP: 2 mg/L (ref 0–10)

## 2021-12-17 LAB — SEDIMENTATION RATE: Sed Rate: 29 mm/hr (ref 0–40)

## 2021-12-17 LAB — PRO B NATRIURETIC PEPTIDE: NT-Pro BNP: 147 pg/mL (ref 0–738)

## 2021-12-17 NOTE — Progress Notes (Signed)
[place] ,   Your labwork results all are within normal limits  No changes need to be made to medications, and no further tests need to be ordered.  Any questions please reach out to the office or message me on MyChart!  Best, Mardene Speak, [name]

## 2021-12-17 NOTE — Progress Notes (Signed)
[place] ,   Your imaging results all are within normal limits.   Any questions please reach out to the office or message me on MyChart!  Best, Mardene Speak, [name]

## 2021-12-21 ENCOUNTER — Encounter: Payer: Self-pay | Admitting: Physician Assistant

## 2021-12-22 ENCOUNTER — Ambulatory Visit (INDEPENDENT_AMBULATORY_CARE_PROVIDER_SITE_OTHER): Payer: PPO

## 2021-12-22 ENCOUNTER — Other Ambulatory Visit: Payer: Self-pay | Admitting: Physician Assistant

## 2021-12-22 VITALS — Wt 130.0 lb

## 2021-12-22 DIAGNOSIS — Z0000 Encounter for general adult medical examination without abnormal findings: Secondary | ICD-10-CM

## 2021-12-22 NOTE — Patient Instructions (Signed)
Misty Page , Thank you for taking time to come for your Medicare Wellness Visit. I appreciate your ongoing commitment to your health goals. Please review the following plan we discussed and let me know if I can assist you in the future.   Screening recommendations/referrals: Colonoscopy: aged out Mammogram: aged out Bone Density: declined referral Recommended yearly ophthalmology/optometry visit for glaucoma screening and checkup Recommended yearly dental visit for hygiene and checkup  Vaccinations: Influenza vaccine: 04/17/21 Pneumococcal vaccine: 81/18/19 Tdap vaccine: 81/7/19 Shingles vaccine: Zostavax 81/1/07   Covid-19:81/25/21, 81/15/21, 04/04/20, 11/10/20, 04/30/21  Advanced directives: no  Conditions/risks identified: none  Next appointment: Follow up in one year for your annual wellness visit 12/27/22 @ 8:15 am by phone   Preventive Care 65 Years and Older, Female Preventive care refers to lifestyle choices and visits with your health care provider that can promote health and wellness. What does preventive care include? A yearly physical exam. This is also called an annual well check. Dental exams once or twice a year. Routine eye exams. Ask your health care provider how often you should have your eyes checked. Personal lifestyle choices, including: Daily care of your teeth and gums. Regular physical activity. Eating a healthy diet. Avoiding tobacco and drug use. Limiting alcohol use. Practicing safe sex. Taking low-dose aspirin every day. Taking vitamin and mineral supplements as recommended by your health care provider. What happens during an annual well check? The services and screenings done by your health care provider during your annual well check will depend on your age, overall health, lifestyle risk factors, and family history of disease. Counseling  Your health care provider may ask you questions about your: Alcohol use. Tobacco use. Drug use. Emotional  well-being. Home and relationship well-being. Sexual activity. Eating habits. History of falls. Memory and ability to understand (cognition). Work and work Statistician. Reproductive health. Screening  You may have the following tests or measurements: Height, weight, and BMI. Blood pressure. Lipid and cholesterol levels. These may be checked every 5 years, or more frequently if you are over 40 years old. Skin check. Lung cancer screening. You may have this screening every year starting at age 79 if you have a 30-pack-year history of smoking and currently smoke or have quit within the past 15 years. Fecal occult blood test (FOBT) of the stool. You may have this test every year starting at age 81. Flexible sigmoidoscopy or colonoscopy. You may have a sigmoidoscopy every 5 years or a colonoscopy every 10 years starting at age 81. Hepatitis C blood test. Hepatitis B blood test. Sexually transmitted disease (STD) testing. Diabetes screening. This is done by checking your blood sugar (glucose) after you have not eaten for a while (fasting). You may have this done every 1-3 years. Bone density scan. This is done to screen for osteoporosis. You may have this done starting at age 81. Mammogram. This may be done every 81-2 years. Talk to your health care provider about how often you should have regular mammograms. Talk with your health care provider about your test results, treatment options, and if necessary, the need for more tests. Vaccines  Your health care provider may recommend certain vaccines, such as: Influenza vaccine. This is recommended every year. Tetanus, diphtheria, and acellular pertussis (Tdap, Td) vaccine. You may need a Td booster every 10 years. Zoster vaccine. You may need this after age 58. Pneumococcal 13-valent conjugate (PCV13) vaccine. One dose is recommended after age 81. Pneumococcal polysaccharide (PPSV23) vaccine. One dose is recommended after age  81. Talk to your  health care provider about which screenings and vaccines you need and how often you need them. This information is not intended to replace advice given to you by your health care provider. Make sure you discuss any questions you have with your health care provider. Document Released: 06/13/2015 Document Revised: 02/04/2016 Document Reviewed: 03/18/2015 Elsevier Interactive Patient Education  2017 [place] Prevention in the Home Falls can cause injuries. They can happen to people of all ages. There are many things you can do to make your home safe and to help prevent falls. What can I do on the outside of my home? Regularly fix the edges of walkways and driveways and fix any cracks. Remove anything that might make you trip as you walk through a door, such as a raised step or threshold. Trim any bushes or trees on the path to your home. Use bright outdoor lighting. Clear any walking paths of anything that might make someone trip, such as rocks or tools. Regularly check to see if handrails are loose or broken. Make sure that both sides of any steps have handrails. Any raised decks and porches should have guardrails on the edges. Have any leaves, snow, or ice cleared regularly. Use sand or salt on walking paths during winter. Clean up any spills in your garage right away. This includes oil or grease spills. What can I do in the bathroom? Use night lights. Install grab bars by the toilet and in the tub and shower. Do not use towel bars as grab bars. Use non-skid mats or decals in the tub or shower. If you need to sit down in the shower, use a plastic, non-slip stool. Keep the floor dry. Clean up any water that spills on the floor as soon as it happens. Remove soap buildup in the tub or shower regularly. Attach bath mats securely with double-sided non-slip rug tape. Do not have throw rugs and other things on the floor that can make you trip. What can I do in the bedroom? Use night  lights. Make sure that you have a light by your bed that is easy to reach. Do not use any sheets or blankets that are too big for your bed. They should not hang down onto the floor. Have a firm chair that has side arms. You can use this for support while you get dressed. Do not have throw rugs and other things on the floor that can make you trip. What can I do in the kitchen? Clean up any spills right away. Avoid walking on wet floors. Keep items that you use a lot in easy-to-reach places. If you need to reach something above you, use a strong step stool that has a grab bar. Keep electrical cords out of the way. Do not use floor polish or wax that makes floors slippery. If you must use wax, use non-skid floor wax. Do not have throw rugs and other things on the floor that can make you trip. What can I do with my stairs? Do not leave any items on the stairs. Make sure that there are handrails on both sides of the stairs and use them. Fix handrails that are broken or loose. Make sure that handrails are as long as the stairways. Check any carpeting to make sure that it is firmly attached to the stairs. Fix any carpet that is loose or worn. Avoid having throw rugs at the top or bottom of the stairs. If you do have  throw rugs, attach them to the floor with carpet tape. Make sure that you have a light switch at the top of the stairs and the bottom of the stairs. If you do not have them, ask someone to add them for you. What else can I do to help prevent falls? Wear shoes that: Do not have high heels. Have rubber bottoms. Are comfortable and fit you well. Are closed at the toe. Do not wear sandals. If you use a stepladder: Make sure that it is fully opened. Do not climb a closed stepladder. Make sure that both sides of the stepladder are locked into place. Ask someone to hold it for you, if possible. Clearly mark and make sure that you can see: Any grab bars or handrails. First and last  steps. Where the edge of each step is. Use tools that help you move around (mobility aids) if they are needed. These include: Canes. Walkers. Scooters. Crutches. Turn on the lights when you go into a dark area. Replace any light bulbs as soon as they burn out. Set up your furniture so you have a clear path. Avoid moving your furniture around. If any of your floors are uneven, fix them. If there are any pets around you, be aware of where they are. Review your medicines with your doctor. Some medicines can make you feel dizzy. This can increase your chance of falling. Ask your doctor what other things that you can do to help prevent falls. This information is not intended to replace advice given to you by your health care provider. Make sure you discuss any questions you have with your health care provider. Document Released: 03/13/2009 Document Revised: 10/23/2015 Document Reviewed: 06/21/2014 Elsevier Interactive Patient Education  2017 [employer].

## 2021-12-22 NOTE — Progress Notes (Signed)
Virtual Visit via Telephone Note  I connected with  [name] on 12/22/21 at  9:00 AM EDT by telephone and verified that I am speaking with the correct person using two identifiers.  Location: Patient: home Provider: [facility] Persons participating in the virtual visit: [place]   I discussed the limitations, risks, security and privacy concerns of performing an evaluation and management service by telephone and the availability of in person appointments. The patient expressed understanding and agreed to proceed.  Interactive audio and video telecommunications were attempted between this nurse and patient, however failed, due to patient having technical difficulties OR patient did not have access to video capability.  We continued and completed visit with audio only.  Some vital signs may be absent or patient reported.   Dionisio David, LPN  Subjective:   KAIMANA NEUZIL is a 81 y.o. female who presents for Medicare Annual (Subsequent) preventive examination.  Review of Systems     Cardiac Risk Factors include: advanced age (>81mn, >>67women);dyslipidemia;hypertension     Objective:    There were no vitals filed for this visit. There is no height or weight on file to calculate BMI.     12/22/2021    9:10 AM 04/13/2021   10:00 AM 04/12/2021    2:43 PM 02/13/2020    2:45 PM 02/13/2020    9:26 AM 02/12/2020    3:02 PM 10/08/2019    9:10 AM  Advanced Directives  Does Patient Have a Medical Advance Directive? No Yes Yes Yes;No Yes Yes Yes  Type of ASocial research officer, governmentLiving will HSpring[place]Living will HWinter GardensLiving will HFalls CreekLiving will HSterlingLiving will HBeatriceLiving will  Does patient want to make changes to medical advance directive?  No - Patient declined   No - Patient declined    Copy of HGoffin  Chart?  No - copy requested No - copy requested No - copy requested  No - copy requested No - copy requested  Would patient like information on creating a medical advance directive? No - Patient declined  No - Patient declined No - Patient declined  No - Patient declined     Current Medications (verified) Outpatient Encounter Medications as of 12/22/2021  Medication Sig   amLODipine (NORVASC) 5 MG tablet TAKE 1 TABLET BY MOUTH DAILY   Ascorbic Acid (VITAMIN C) 1000 MG tablet Take 1,000 mg by mouth daily.   aspirin EC 81 MG EC tablet Take 1 tablet (81 mg total) by mouth daily. Swallow whole.   Calcium-Vitamin D-Vitamin K 500-100-40 MG-UNT-MCG CHEW Chew 1 tablet by mouth 2 (two) times daily. VIACTIV, 500-100-40 (Oral Tablet Chewable)  1 tablet twice a day for 0 days  Quantity: 0.00;  Refills: 0   Ordered :13-Apr-2010  ACelene Kras MA, Anastasiya ;  Started 29-October-2008 Active Comments: DX: 733.00   clopidogrel (PLAVIX) 75 MG tablet TAKE 1 TABLET BY MOUTH DAILY   ezetimibe (ZETIA) 10 MG tablet TAKE 1 TABLET BY MOUTH DAILY   famotidine (PEPCID) 20 MG tablet TAKE 1 TABLET BY MOUTH TWICE A DAY   ferrous sulfate 325 (65 FE) MG EC tablet Take 325 mg by mouth daily.   fexofenadine (ALLEGRA) 180 MG tablet Take 180 mg by mouth at bedtime as needed.   fluticasone (FLONASE) 50 MCG/ACT nasal spray USE 2 SPRAYS EACH NOSTRIL DAILY   losartan (COZAAR) 100 MG tablet TAKE 1  TABLET BY MOUTH DAILY   meclizine (ANTIVERT) 25 MG tablet Take 1 tablet (25 mg total) by mouth 3 (three) times daily as needed for dizziness or nausea.   Mesalamine 800 MG TBEC Take 800 mg by mouth 2 (two) times daily.   methimazole (TAPAZOLE) 5 MG tablet Take 2.5 mg by mouth daily.    Probiotic Product (PROBIOTIC DAILY PO) Take 1 capsule by mouth daily.   rosuvastatin (CRESTOR) 10 MG tablet Take 1 tablet (10 mg total) by mouth daily.   rosuvastatin (CRESTOR) 20 MG tablet TAKE ONE-HALF TABLET BY MOUTH DAILY   clindamycin (CLEOCIN) 300 MG  capsule Take 1 capsule (300 mg total) by mouth 3 (three) times daily. (Patient not taking: Reported on 12/22/2021)   Cranberry-Cholecalciferol 4200-500 MG-UNIT CAPS Take by mouth. (Patient not taking: Reported on 12/22/2021)   ondansetron (ZOFRAN ODT) 4 MG disintegrating tablet Take 1 tablet (4 mg total) by mouth every 8 (eight) hours as needed for nausea or vomiting. (Patient not taking: Reported on 12/16/2021)   promethazine (PHENERGAN) 6.25 MG/5ML syrup Take 5 mLs (6.25 mg total) by mouth every 8 (eight) hours as needed for up to 7 days for nausea or vomiting.   No facility-administered encounter medications on file as of 12/22/2021.    Allergies (verified) Amoxicillin, Naproxen, Penicillins, Codeine, Levofloxacin, and Shellfish allergy   History: Past Medical History:  Diagnosis Date   Allergy    Anemia    Arthritis    CA of skin 10/15/2014   Calculus of kidney 10/15/2014   Seen in ER 06/16/10.    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   Coronary artery disease    Diverticulitis    Environmental and seasonal allergies    Fibrocystic breast disease (FCBD)    GERD (gastroesophageal reflux disease)    Heart murmur    High cholesterol    History of heart artery stent    Hyperlipidemia    Hypertension    Hyperthyroidism    Myocardial infarction ([place])    Osteomyelitis of left femur ([place]) 11/30/2018   Osteoporosis    Reactive depression 02/25/2019   Thyroid disease    TIA (transient ischemic attack)    Past Surgical History:  Procedure Laterality Date   ABDOMINAL HYSTERECTOMY  2007   BREAST ENHANCEMENT SURGERY  2004   BREAST IMPLANT REMOVAL     BREAST SURGERY  1984   Fibrocystic Disease   CAROTID ENDARTERECTOMY Left 06/21/2014   Dr. Delana Meyer   CAROTID ENDARTERECTOMY Right 08/02/2014   Dr. Delana Meyer   CAROTID PTA/STENT INTERVENTION N/A 06/15/2017   Procedure: CAROTID PTA/STENT INTERVENTION;  Surgeon: Katha Cabal, MD;  Location: [place] [facility];  Service: Cardiovascular;  Laterality: N/A;   COLONOSCOPY WITH PROPOFOL N/A 07/14/2015   Procedure: COLONOSCOPY WITH PROPOFOL;  Surgeon: Hulen Luster, MD;  Location: [facility] ENDOSCOPY;  Service: Gastroenterology;  Laterality: N/A;   COLONOSCOPY WITH PROPOFOL N/A 02/08/2018   Procedure: COLONOSCOPY WITH PROPOFOL;  Surgeon: Toledo, Benay Pike, MD;  Location: [facility] ENDOSCOPY;  Service: Gastroenterology;  Laterality: N/A;   CORONARY ANGIOPLASTY WITH STENT PLACEMENT  1999   ESOPHAGOGASTRODUODENOSCOPY (EGD) WITH PROPOFOL N/A 02/08/2018   Procedure: ESOPHAGOGASTRODUODENOSCOPY (EGD) WITH PROPOFOL;  Surgeon: Toledo, Benay Pike, MD;  Location: [facility] ENDOSCOPY;  Service: Gastroenterology;  Laterality: N/A;   FEMORAL ARTERY STENT  08/2003   HERNIA REPAIR  2008   LEG AMPUTATION AT HIP Left 02/28/2019   MASTECTOMY  RENAL ARTERY STENT  97/0263   TRANSUMBILICAL AUGMENTATION MAMMAPLASTY     VASCULAR SURGERY     VISCERAL ARTERY INTERVENTION N/A 02/13/2020   Procedure: VISCERAL ARTERY INTERVENTION;  Surgeon: Algernon Huxley, MD;  Location: [place] [facility];  Service: Cardiovascular;  Laterality: N/A;   Family History  Problem Relation Age of Onset   Hyperlipidemia Mother    Congestive Heart Failure Mother    COPD Mother    Heart disease Mother    Hypertension Mother    Osteoporosis Mother    Pancreatic cancer Father    Arthritis Sister    Alzheimer's disease Sister    Hypertension Sister    Prostate cancer Brother    Heart attack Brother    Stomach cancer Brother    Kidney failure Brother    Leukemia Brother    Emphysema Brother    Pancreatic cancer Brother    Stomach cancer Brother    Breast cancer Paternal Grandmother    Leukemia Paternal Grandfather    Heart attack Maternal Uncle    Stroke Maternal Aunt    Social History   Socioeconomic History   Marital status: Widowed    Spouse name: Konrad Dolores   Number of children: 1   Years of education: Not on file    Highest education level: 12th grade  Occupational History   Occupation: Emergency planning/management officer    Comment: retired  Tobacco Use   Smoking status: Former    Types: Cigarettes    Quit date: 05/30/1994    Years since quitting: 27.5   Smokeless tobacco: Never  Vaping Use   Vaping Use: Never used  Substance and Sexual Activity   Alcohol use: Not Currently    Comment: rare- 1 glass of wine EOW   Drug use: No   Sexual activity: Not Currently    Birth control/protection: None  Other Topics Concern   Not on file  Social History Narrative   Lives alone; gets assistance 3x/week-hired help   Social Determinants of Health   Financial Resource Strain: Low Risk  (12/22/2021)   Overall Financial Resource Strain (CARDIA)    Difficulty of Paying Living Expenses: Not hard at all  Food Insecurity: No Food Insecurity (12/22/2021)   Hunger Vital Sign    Worried About Running Out of Food in the Last Year: Never true    [place] in the Last Year: Never true  Transportation Needs: No Transportation Needs (12/22/2021)   PRAPARE - Hydrologist (Medical): No    Lack of Transportation (Non-Medical): No  Physical Activity: Inactive (12/22/2021)   Exercise Vital Sign    Days of Exercise per Week: 0 days    Minutes of Exercise per Session: 0 min  Stress: No Stress Concern Present (12/22/2021)   [place]    Feeling of Stress : Only a little  Social Connections: Socially Isolated (12/22/2021)   Social Connection and Isolation Panel [NHANES]    Frequency of Communication with Friends and Family: Three times a week    Frequency of Social Gatherings with Friends and Family: Once a week    Attends Religious Services: Never    Marine scientist or Organizations: No    Attends Archivist Meetings: Never    Marital Status: Widowed    Tobacco Counseling Counseling given: Not Answered   Clinical  Intake:  Pre-visit preparation completed: Yes  Pain : No/denies pain  Nutritional Risks: None Diabetes: No  How often do you need to have someone help you when you read instructions, pamphlets, or other written materials from your doctor or pharmacy?: 1 - Never  Diabetic?no  Interpreter Needed?: No  Information entered by :: Kirke Shaggy, LPN   Activities of Daily Living    12/22/2021    9:11 AM 08/21/2021   10:02 AM  In your present state of health, do you have any difficulty performing the following activities:  Hearing? 0 0  Vision? 0 0  Difficulty concentrating or making decisions? 0 0  Walking or climbing stairs? 1 1  Dressing or bathing? 0 0  Doing errands, shopping? 1 1  Preparing Food and eating ? N   Using the Toilet? N   In the past six months, have you accidently leaked urine? N   Do you have problems with loss of bowel control? N   Managing your Medications? N   Managing your Finances? N   Housekeeping or managing your Housekeeping? Y     Patient Care Team: Virginia Crews, MD as PCP - General (Family Medicine) Delana Meyer, Dolores Lory, MD as Consulting Physician (Vascular Surgery) Corey Skains, MD as Consulting Physician (Cardiology) Lonia Farber, MD as Consulting Physician (Internal Medicine) Poggi, Marshall Cork, MD as Consulting Physician (Surgery) Adron Bene as Physician Assistant (Gastroenterology) Rubye Beach as Physician Assistant (Family Medicine) Reva Bores, MD as Referring Physician (Hematology and Oncology) Magda Bernheim, MD as Referring Physician (Orthopedic Surgery) Dasher, Rayvon Char, MD (Dermatology)  Indicate any recent Medical Services you may have received from other than [facility] providers in the past year (date may be approximate).     Assessment:   This is a routine wellness examination for Loriene.  Hearing/Vision screen Hearing Screening - Comments:: No aids Vision Screening - Comments:: Wears  glasses- [facility]  Dietary issues and exercise activities discussed: Current Exercise Habits: The patient does not participate in regular exercise at present   Goals Addressed             This Visit's Progress    DIET - EAT MORE FRUITS AND VEGETABLES         Depression Screen    12/22/2021    9:08 AM 08/21/2021   10:02 AM 05/04/2021   10:57 AM 04/17/2021   10:56 AM 10/24/2020   10:17 AM 04/28/2020   10:50 AM 10/08/2019    9:07 AM  PHQ 2/9 Scores  PHQ - 2 Score 2 2 1 3 3 1 1   PHQ- 9 Score 4 4 3 4 9 2      Fall Risk    12/22/2021    9:11 AM 08/21/2021   10:03 AM 04/17/2021   10:55 AM 10/24/2020   10:17 AM 04/28/2020   10:50 AM  Fall Risk   Falls in the past year? 0 0 0 0 0  Number falls in past yr: 0 0 0 0 0  Injury with Fall? 0 0 0 0 0  Risk for fall due to : No Fall Risks No Fall Risks  No Fall Risks No Fall Risks  Follow up Falls evaluation completed Falls evaluation completed  Falls evaluation completed Falls evaluation completed    FALL RISK PREVENTION PERTAINING TO THE HOME:  Any stairs in or around the home? No  If so, are there any without handrails? No  Home free of loose throw rugs in walkways, pet beds, electrical cords, etc? Yes  Adequate lighting  in your home to reduce risk of falls? Yes   ASSISTIVE DEVICES UTILIZED TO PREVENT FALLS:  Life alert? No  Use of a cane, walker or w/c? Yes  Grab bars in the bathroom? Yes  Shower chair or bench in shower? Yes  Elevated toilet seat or a handicapped toilet? Yes    Cognitive Function:    05/09/2019    2:33 PM  MMSE - Mini Mental State Exam  Orientation to time 3  Orientation to Place 5  Registration 3  Attention/ Calculation 3  Recall 2  Language- name 2 objects 2  Language- repeat 1  Language- follow 3 step command 3  Language- read & follow direction 1  Write a sentence 1  Copy design 0  Total score 24        12/22/2021    9:13 AM 10/24/2020   10:24 AM  6CIT Screen  What Year? 0 points  0 points  What month? 0 points 0 points  What time? 0 points 0 points  Count back from 20 0 points 0 points  Months in reverse 0 points 0 points  Repeat phrase 2 points 2 points  Total Score 2 points 2 points    Immunizations Immunization History  Administered Date(s) Administered   Fluad Quad(high Dose 65+) 02/14/2020, 04/17/2021   Influenza Split 04/19/2011   Influenza, High Dose Seasonal PF 02/18/2014, 03/12/2016, 03/07/2019   Influenza,inj,Quad PF,6+ Mos 02/10/2013, 03/07/2017, 03/24/2018   Influenza-Unspecified 02/10/2013, 03/01/2015   PFIZER Comirnaty(Gray Top)Covid-19 Tri-Sucrose Vaccine 06/25/2019, 07/16/2019, 04/04/2020   Pfizer Covid-19 Vaccine Bivalent Booster 35yr & up 11/10/2020, 04/30/2021   Pneumococcal Conjugate-13 05/18/2013   Pneumococcal Polysaccharide-23 03/02/2004, 08/15/2017   Td 02/08/2005, 11/04/2017   Zoster, Live 01/29/2006    TDAP status: Up to date  Flu Vaccine status: Up to date  Pneumococcal vaccine status: Up to date  Covid-19 vaccine status: Completed vaccines  Qualifies for Shingles Vaccine? Yes   Zostavax completed Yes   Shingrix Completed?: No.    Education has been provided regarding the importance of this vaccine. Patient has been advised to call insurance company to determine out of pocket expense if they have not yet received this vaccine. Advised may also receive vaccine at local pharmacy or Health Dept. Verbalized acceptance and understanding.  Screening Tests Health Maintenance  Topic Date Due   Zoster Vaccines- Shingrix (1 of 2) Never done   INFLUENZA VACCINE  12/29/2021   TETANUS/TDAP  11/05/2027   Pneumonia Vaccine 81 Years old  Completed   DEXA SCAN  Completed   COVID-19 Vaccine  Completed   HPV VACCINES  Aged Out    Health Maintenance  Health Maintenance Due  Topic Date Due   Zoster Vaccines- Shingrix (1 of 2) Never done    Colorectal cancer screening: No longer required.   Mammogram status: No longer required  due to age.  [facility] declined referral  Lung Cancer Screening: (Low Dose CT Chest recommended if Age 858-80years, 30 pack-year currently smoking OR have quit w/in 15years.) does not qualify.   Additional Screening:  Hepatitis C Screening: does not qualify; Completed no  Vision Screening: Recommended annual ophthalmology exams for early detection of glaucoma and other disorders of the eye. Is the patient up to date with their annual eye exam?  Yes  Who is the provider or what is the name of the office in which the patient attends annual eye exams? [facility] If pt is not established with a provider, would they like to be  referred to a provider to establish care? No .   Dental Screening: Recommended annual dental exams for proper oral hygiene  Community Resource Referral / Chronic Care Management: CRR required this visit?  No   CCM required this visit?  No      Plan:     I have personally reviewed and noted the following in the patient's chart:   Medical and social history Use of alcohol, tobacco or illicit drugs  Current medications and supplements including opioid prescriptions.  Functional ability and status Nutritional status Physical activity Advanced directives List of other physicians Hospitalizations, surgeries, and ER visits in previous 12 months Vitals Screenings to include cognitive, depression, and falls Referrals and appointments  In addition, I have reviewed and discussed with patient certain preventive protocols, quality metrics, and best practice recommendations. A written personalized care plan for preventive services as well as general preventive health recommendations were provided to patient.     Dionisio David, LPN   [id number]/[id number]   Nurse Notes: none

## 2021-12-23 ENCOUNTER — Ambulatory Visit (INDEPENDENT_AMBULATORY_CARE_PROVIDER_SITE_OTHER): Payer: PPO | Admitting: Physician Assistant

## 2021-12-23 ENCOUNTER — Encounter: Payer: Self-pay | Admitting: Physician Assistant

## 2021-12-23 VITALS — BP 139/71 | HR 81 | Temp 99.4°F | Resp 16

## 2021-12-23 DIAGNOSIS — I6523 Occlusion and stenosis of bilateral carotid arteries: Secondary | ICD-10-CM | POA: Diagnosis not present

## 2021-12-23 DIAGNOSIS — M7989 Other specified soft tissue disorders: Secondary | ICD-10-CM | POA: Diagnosis not present

## 2021-12-23 DIAGNOSIS — S78112A Complete traumatic amputation at level between left hip and knee, initial encounter: Secondary | ICD-10-CM

## 2021-12-23 DIAGNOSIS — I2510 Atherosclerotic heart disease of native coronary artery without angina pectoris: Secondary | ICD-10-CM

## 2021-12-23 DIAGNOSIS — I739 Peripheral vascular disease, unspecified: Secondary | ICD-10-CM | POA: Diagnosis not present

## 2021-12-23 DIAGNOSIS — R011 Cardiac murmur, unspecified: Secondary | ICD-10-CM | POA: Diagnosis not present

## 2021-12-23 DIAGNOSIS — K559 Vascular disorder of intestine, unspecified: Secondary | ICD-10-CM | POA: Diagnosis not present

## 2021-12-23 MED ORDER — FUROSEMIDE 20 MG PO TABS: 20.0000 mg | ORAL_TABLET | Freq: Two times a day (BID) | ORAL | 0 refills | Status: DC

## 2021-12-23 NOTE — Progress Notes (Unsigned)
Established patient visit  I,Joseline E Rosas,acting as a scribe for [employer], [name].,have documented all relevant documentation on the behalf of Mardene Speak, [name],as directed by  [employer], [name] while in the presence of [employer], [name].   Patient: Misty Page   DOB: [birth year]   81 y.o. Female  MRN: [id number] Visit Date: 12/23/2021  Today's healthcare provider: Mardene Speak, [name]   No chief complaint on file.  Subjective    HPI  Follow-up:Patient here with concerns of recurrent right leg swelling, not better, is painful. She has completed 2 courses of antibiotics and Bactrim. Stopped taking clindamycin after GI complaints Per chart on 12/16/21: Patient complains of edema. The location of the edema is lower leg(s) right. This is something recurrent. Patient was seen in office for this 11/16/2021 treated with Doxycycline.she had an US DVT done which was normal.   Medications: Outpatient Medications Prior to Visit  Medication Sig   amLODipine (NORVASC) 5 MG tablet TAKE 1 TABLET BY MOUTH DAILY   Ascorbic Acid (VITAMIN C) 1000 MG tablet Take 1,000 mg by mouth daily.   aspirin EC 81 MG EC tablet Take 1 tablet (81 mg total) by mouth daily. Swallow whole.   Calcium-Vitamin D-Vitamin K 500-100-40 MG-UNT-MCG CHEW Chew 1 tablet by mouth 2 (two) times daily. VIACTIV, 500-100-40 (Oral Tablet Chewable)  1 tablet twice a day for 0 days  Quantity: 0.00;  Refills: 0   Ordered :13-Apr-2010  Celene Kras, MA, Anastasiya ;  Started 29-October-2008 Active Comments: DX: 733.00   clopidogrel (PLAVIX) 75 MG tablet TAKE 1 TABLET BY MOUTH DAILY   ezetimibe (ZETIA) 10 MG tablet TAKE 1 TABLET BY MOUTH DAILY   famotidine (PEPCID) 20 MG tablet TAKE 1 TABLET BY MOUTH TWICE A DAY   ferrous sulfate 325 (65 FE) MG EC tablet Take 325 mg by mouth daily.   fexofenadine (ALLEGRA) 180 MG tablet Take 180 mg by mouth at bedtime as needed.   fluticasone (FLONASE) 50 MCG/ACT nasal spray USE 2 SPRAYS EACH  NOSTRIL DAILY   losartan (COZAAR) 100 MG tablet TAKE 1 TABLET BY MOUTH DAILY   meclizine (ANTIVERT) 25 MG tablet Take 1 tablet (25 mg total) by mouth 3 (three) times daily as needed for dizziness or nausea.   Mesalamine 800 MG TBEC Take 800 mg by mouth 2 (two) times daily.   methimazole (TAPAZOLE) 5 MG tablet Take 2.5 mg by mouth daily.    Probiotic Product (PROBIOTIC DAILY PO) Take 1 capsule by mouth daily.   rosuvastatin (CRESTOR) 10 MG tablet Take 1 tablet (10 mg total) by mouth daily.   rosuvastatin (CRESTOR) 20 MG tablet TAKE ONE-HALF TABLET BY MOUTH DAILY   clindamycin (CLEOCIN) 300 MG capsule Take 1 capsule (300 mg total) by mouth 3 (three) times daily. (Patient not taking: Reported on 12/22/2021)   Cranberry-Cholecalciferol 4200-500 MG-UNIT CAPS Take by mouth. (Patient not taking: Reported on 12/22/2021)   ondansetron (ZOFRAN ODT) 4 MG disintegrating tablet Take 1 tablet (4 mg total) by mouth every 8 (eight) hours as needed for nausea or vomiting. (Patient not taking: Reported on 12/16/2021)   promethazine (PHENERGAN) 6.25 MG/5ML syrup Take 5 mLs (6.25 mg total) by mouth every 8 (eight) hours as needed for up to 7 days for nausea or vomiting.   No facility-administered medications prior to visit.    Review of Systems  Cardiovascular:  Positive for leg swelling.       Objective    BP 139/71 (BP Location: Right  Arm, Patient Position: Sitting, Cuff Size: Normal)   Pulse 81   Temp 99.4 F (37.4 C) (Oral)   Resp 16    Physical Exam Vitals reviewed.  Constitutional:      General: She is not in acute distress.    Appearance: Normal appearance. She is well-developed. She is not diaphoretic.  HENT:     Head: Normocephalic and atraumatic.     Nose: Nose normal.  Eyes:     General: No scleral icterus.    Extraocular Movements: Extraocular movements intact.     Conjunctiva/sclera: Conjunctivae normal.     Pupils: Pupils are equal, round, and reactive to light.  Neck:     Thyroid:  No thyromegaly.  Cardiovascular:     Rate and Rhythm: Normal rate and regular rhythm.     Pulses: Normal pulses.     Heart sounds: Normal heart sounds. No murmur heard. Pulmonary:     Effort: Pulmonary effort is normal. No respiratory distress.     Breath sounds: Normal breath sounds. No wheezing, rhonchi or rales.  Musculoskeletal:        General: Swelling and tenderness present.     Cervical back: Normal range of motion and neck supple.     Right lower leg: Edema (difficulties to locate pedal pulse with pitting edema) present.     Left lower leg: No edema.     Comments: Pt is wheelchair bound due to amputated left leg  Lymphadenopathy:     Cervical: No cervical adenopathy.  Skin:    General: Skin is warm and dry.     Findings: No rash.  Neurological:     Mental Status: She is alert and oriented to person, place, and time. Mental status is at baseline.     Motor: Weakness present.  Psychiatric:        Behavior: Behavior normal.        Thought Content: Thought content normal.        Judgment: Judgment normal.       No results found for any visits on 12/23/21.  Assessment & Plan     1. Leg swelling Difficulties to locate pedal pulse due to pitting edema Pain with raising leg up and with rest Mhx significant for PVD, see below - furosemide (LASIX) 20 MG tablet; Take 1 tablet (20 mg total) by mouth 2 (two) times daily.  Dispense: 60 tablet; Refill: 0 - Ambulatory referral to Vascular Surgery - Encouraged to do stretching and exercise her right leg  2. Arteriosclerosis of coronary artery 3. Above knee amputation of left lower extremity ([place]) 4. Mesenteric ischemia ([place]) 5. Bilateral carotid artery stenosis 6. Peripheral vascular disease [facility])  Patient saw vascular surgeon on 11/23/21 for acute on chronic mesenteric ischemia, FU evaluation for carotid stenosis. Pt did not address leg pain/swelling at that visit. Her US doppler for DVT was negative on 11/16/21 Recent CXR  showed no acute abnormalities, bnp, sed rate, cbc wnl She received 3 courses of antibiotics without much relief for possible cellulitis  Her BP today was 139/71, last LDL from 05/08/21 was 114 Continue current med regimen for Hypercholesteremia, HTN, nitrates PRN for chest pain, antiplatelet therapy Encouraged healthy heart diet, exercise at least 4 times per week  The patient was advised to call back or seek an in-person evaluation if the symptoms worsen or if the condition fails to improve as anticipated.  I discussed the assessment and treatment plan with the patient. The patient was provided an opportunity to ask  questions and all were answered. The patient agreed with the plan and demonstrated an understanding of the instructions.  The entirety of the information documented in the History of Present Illness, Review of Systems and Physical Exam were personally obtained by me. Portions of this information were initially documented by the CMA and reviewed by me for thoroughness and accuracy.  Portions of this note were created using dictation software and may contain typographical errors.     Mardene Speak, [name]  [facility] [phone] (phone) [phone] (fax)  [place]

## 2021-12-28 ENCOUNTER — Encounter: Payer: PPO | Admitting: Family Medicine

## 2021-12-31 ENCOUNTER — Ambulatory Visit (INDEPENDENT_AMBULATORY_CARE_PROVIDER_SITE_OTHER): Payer: PPO | Admitting: Vascular Surgery

## 2021-12-31 ENCOUNTER — Encounter (INDEPENDENT_AMBULATORY_CARE_PROVIDER_SITE_OTHER): Payer: Self-pay | Admitting: Vascular Surgery

## 2021-12-31 VITALS — BP 182/84 | HR 80 | Resp 18 | Ht 62.0 in

## 2021-12-31 DIAGNOSIS — I739 Peripheral vascular disease, unspecified: Secondary | ICD-10-CM | POA: Diagnosis not present

## 2021-12-31 DIAGNOSIS — K559 Vascular disorder of intestine, unspecified: Secondary | ICD-10-CM

## 2021-12-31 DIAGNOSIS — I6523 Occlusion and stenosis of bilateral carotid arteries: Secondary | ICD-10-CM | POA: Diagnosis not present

## 2021-12-31 DIAGNOSIS — I2510 Atherosclerotic heart disease of native coronary artery without angina pectoris: Secondary | ICD-10-CM | POA: Diagnosis not present

## 2021-12-31 DIAGNOSIS — I890 Lymphedema, not elsewhere classified: Secondary | ICD-10-CM | POA: Diagnosis not present

## 2021-12-31 NOTE — Progress Notes (Signed)
MRN : [id number]  Misty Page is a 81 y.o. (1941/01/02) female who presents with chief complaint of legs swell.  History of Present Illness:   The patient returns to the office for followup evaluation regarding leg swelling.  The swelling has persisted and the pain associated with swelling continues. There have not been any interval development of a ulcerations or wounds.  Since the previous visit the patient has been wearing graduated compression stockings and has noted little if any improvement in the lymphedema. The patient has been using compression routinely morning until night.  The patient also states elevation during the day and exercise is being done too.     She is also followed for mesenteric ischemia s/p intervention on 02/13/2020 for acute on chronic mesenteric ischemia.  The patient notes that since she has had her intervention done her abdominal pain has imporved.  She denies any nausea or vomiting.  She denies any food phobias.  She denies any fever, chills, nausea, vomiting or diarrhea.  There was also a stent placed within the SMA.     The patient is also seen for follow up evaluation of carotid stenosis. The carotid stenosis followed by ultrasound.    The patient denies amaurosis fugax. There is no recent history of TIA symptoms or focal motor deficits. There is no prior documented CVA.   The patient is taking enteric-coated aspirin 81 mg daily.  Current Meds  Medication Sig   amLODipine (NORVASC) 5 MG tablet TAKE 1 TABLET BY MOUTH DAILY   Ascorbic Acid (VITAMIN C) 1000 MG tablet Take 1,000 mg by mouth daily.   aspirin EC 81 MG EC tablet Take 1 tablet (81 mg total) by mouth daily. Swallow whole.   clopidogrel (PLAVIX) 75 MG tablet TAKE 1 TABLET BY MOUTH DAILY   ezetimibe (ZETIA) 10 MG tablet TAKE 1 TABLET BY MOUTH DAILY   famotidine (PEPCID) 20 MG tablet TAKE 1 TABLET BY MOUTH TWICE A DAY   ferrous sulfate 325 (65 FE) MG EC tablet Take 325 mg by mouth daily.    fexofenadine (ALLEGRA) 180 MG tablet Take 180 mg by mouth at bedtime as needed.   fluticasone (FLONASE) 50 MCG/ACT nasal spray USE 2 SPRAYS EACH NOSTRIL DAILY   furosemide (LASIX) 20 MG tablet Take 1 tablet (20 mg total) by mouth 2 (two) times daily.   losartan (COZAAR) 100 MG tablet TAKE 1 TABLET BY MOUTH DAILY   meclizine (ANTIVERT) 25 MG tablet Take 1 tablet (25 mg total) by mouth 3 (three) times daily as needed for dizziness or nausea.   Mesalamine 800 MG TBEC Take 800 mg by mouth 2 (two) times daily.   methimazole (TAPAZOLE) 5 MG tablet Take 2.5 mg by mouth daily.    Probiotic Product (PROBIOTIC DAILY PO) Take 1 capsule by mouth daily.   rosuvastatin (CRESTOR) 20 MG tablet TAKE ONE-HALF TABLET BY MOUTH DAILY    Past Medical History:  Diagnosis Date   Allergy    Anemia    Arthritis    CA of skin 10/15/2014   Calculus of kidney 10/15/2014   Seen in ER 06/16/10.    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   Coronary artery disease    Diverticulitis    Environmental and seasonal allergies    Fibrocystic breast disease (FCBD)    GERD (gastroesophageal reflux disease)    Heart murmur    High cholesterol  History of heart artery stent    Hyperlipidemia    Hypertension    Hyperthyroidism    Myocardial infarction [facility])    Osteomyelitis of left femur ([place]) 11/30/2018   Osteoporosis    Reactive depression 02/25/2019   Thyroid disease    TIA (transient ischemic attack)     Past Surgical History:  Procedure Laterality Date   ABDOMINAL HYSTERECTOMY  2007   BREAST ENHANCEMENT SURGERY  2004   BREAST IMPLANT REMOVAL     BREAST SURGERY  1984   Fibrocystic Disease   CAROTID ENDARTERECTOMY Left 06/21/2014   Dr. Delana Meyer   CAROTID ENDARTERECTOMY Right 08/02/2014   Dr. Delana Meyer   CAROTID PTA/STENT INTERVENTION N/A 06/15/2017   Procedure: CAROTID PTA/STENT INTERVENTION;  Surgeon: Katha Cabal, MD;  Location: [facility];  Service: Cardiovascular;  Laterality: N/A;   COLONOSCOPY WITH PROPOFOL N/A 07/14/2015   Procedure: COLONOSCOPY WITH PROPOFOL;  Surgeon: Hulen Luster, MD;  Location: [facility] ENDOSCOPY;  Service: Gastroenterology;  Laterality: N/A;   COLONOSCOPY WITH PROPOFOL N/A 02/08/2018   Procedure: COLONOSCOPY WITH PROPOFOL;  Surgeon: Toledo, Benay Pike, MD;  Location: [facility] ENDOSCOPY;  Service: Gastroenterology;  Laterality: N/A;   CORONARY ANGIOPLASTY WITH STENT PLACEMENT  1999   ESOPHAGOGASTRODUODENOSCOPY (EGD) WITH PROPOFOL N/A 02/08/2018   Procedure: ESOPHAGOGASTRODUODENOSCOPY (EGD) WITH PROPOFOL;  Surgeon: Toledo, Benay Pike, MD;  Location: [facility] ENDOSCOPY;  Service: Gastroenterology;  Laterality: N/A;   FEMORAL ARTERY STENT  08/2003   HERNIA REPAIR  2008   LEG AMPUTATION AT HIP Left 02/28/2019   MASTECTOMY     RENAL ARTERY STENT  [id number]   TRANSUMBILICAL AUGMENTATION MAMMAPLASTY     VASCULAR SURGERY     VISCERAL ARTERY INTERVENTION N/A 02/13/2020   Procedure: VISCERAL ARTERY INTERVENTION;  Surgeon: Algernon Huxley, MD;  Location: [place] [facility];  Service: Cardiovascular;  Laterality: N/A;    Social History Social History   Tobacco Use   Smoking status: Former    Types: Cigarettes    Quit date: 05/30/1994    Years since quitting: 27.6   Smokeless tobacco: Never  Vaping Use   Vaping Use: Never used  Substance Use Topics   Alcohol use: Not Currently    Comment: rare- 1 glass of wine EOW   Drug use: No    Family History Family History  Problem Relation Age of Onset   Hyperlipidemia Mother    Congestive Heart Failure Mother    COPD Mother    Heart disease Mother    Hypertension Mother    Osteoporosis Mother    Pancreatic cancer Father    Arthritis Sister    Alzheimer's disease Sister    Hypertension Sister    Prostate cancer Brother    Heart attack Brother    Stomach cancer Brother    Kidney failure Brother    Leukemia Brother    Emphysema Brother    Pancreatic  cancer Brother    Stomach cancer Brother    Breast cancer Paternal Grandmother    Leukemia Paternal Grandfather    Heart attack Maternal Uncle    Stroke Maternal Aunt     Allergies  Allergen Reactions   Amoxicillin Anaphylaxis   Naproxen Hives, Shortness Of Breath, Nausea And Vomiting and Swelling   Penicillins Shortness Of Breath, Swelling and Rash    Has patient had a PCN reaction causing immediate rash, facial/tongue/throat swelling, SOB or lightheadedness with hypotension: Yes Has patient had a PCN reaction causing severe rash involving mucus membranes or skin  necrosis: Yes Has patient had a PCN reaction that required hospitalization: Yes Has patient had a PCN reaction occurring within the last 10 years: No  If all of the above answers are "NO", then may proceed with Cephalosporin use.    Codeine Nausea And Vomiting   Levofloxacin Nausea And Vomiting   Shellfish Allergy Hives, Diarrhea, Nausea And Vomiting and Swelling     REVIEW OF SYSTEMS (Negative unless checked)  Constitutional: [] Weight loss  [] Fever  [] Chills Cardiac: [] Chest pain   [] Chest pressure   [] Palpitations   [] Shortness of breath when laying flat   [] Shortness of breath with exertion. Vascular:  [] Pain in legs with walking   [x] Pain in legs with standing  [] History of DVT   [] Phlebitis   [x] Swelling in legs   [] Varicose veins   [] Non-healing ulcers Pulmonary:   [] Uses home oxygen   [] Productive cough   [] Hemoptysis   [] Wheeze  [] COPD   [] Asthma Neurologic:  [] Dizziness   [] Seizures   [] History of stroke   [] History of TIA  [] Aphasia   [] Vissual changes   [] Weakness or numbness in arm   [] Weakness or numbness in leg Musculoskeletal:   [] Joint swelling   [] Joint pain   [] Low back pain Hematologic:  [] Easy bruising  [] Easy bleeding   [] Hypercoagulable state   [] Anemic Gastrointestinal:  [] Diarrhea   [] Vomiting  [] Gastroesophageal reflux/heartburn   [] Difficulty swallowing. Genitourinary:  [] Chronic kidney disease    [] Difficult urination  [] Frequent urination   [] Blood in urine Skin:  [] Rashes   [] Ulcers  Psychological:  [] History of anxiety   []  History of major depression.  Physical Examination  Vitals:   12/31/21 1328  BP: (!) 182/84  Pulse: 80  Resp: 18  Height: 5' 2"  (1.575 m)   Body mass index is 23.78 kg/m. Gen: WD/WN, NAD Head: [place]/AT, No temporalis wasting.  Ear/Nose/Throat: Hearing grossly intact, nares w/o erythema or drainage, pinna without lesions Eyes: PER, EOMI, sclera nonicteric.  Neck: Supple, no gross masses.  No JVD.  Pulmonary:  Good air movement, no audible wheezing, no use of accessory muscles.  Cardiac: RRR, precordium not hyperdynamic. Vascular:  scattered varicosities present right.  Severe venous stasis changes to the right leg.  3-4+ hard  woody edema of the right leg.  Left AKA Vessel Right Left  Radial Palpable Palpable  DP Not Palpable AKA  PT Not Palpable AKA  Gastrointestinal: soft, non-distended. No guarding/no peritoneal signs.  Musculoskeletal: M/S 5/5 throughout.  No deformity.  Neurologic: CN 2-12 intact. Pain and light touch intact in extremities.  Symmetrical.  Speech is fluent. Motor exam as listed above. Psychiatric: Judgment intact, Mood & affect appropriate for pt's clinical situation. Dermatologic: Venous rashes no ulcers noted.  No changes consistent with cellulitis. Lymph : No lichenification or skin changes of chronic lymphedema.  CBC Lab Results  Component Value Date   WBC 7.5 12/16/2021   HGB 13.2 12/16/2021   HCT 40.2 12/16/2021   MCV 94 12/16/2021   PLT 350 12/16/2021    BMET    Component Value Date/Time   NA 137 04/12/2021 1517   NA 137 07/22/2020 0000   NA 140 06/22/2014 0417   K 3.8 04/12/2021 1517   K 3.5 06/22/2014 0417   CL 101 04/12/2021 1517   CL 105 06/22/2014 0417   CO2 25 04/12/2021 1517   CO2 27 06/22/2014 0417   GLUCOSE 133 (H) 04/12/2021 1517   GLUCOSE 92 06/22/2014 0417   BUN 18 04/12/2021 1517   BUN  16  07/22/2020 0000   BUN 9 06/22/2014 0417   CREATININE 0.49 04/14/2021 0943   CREATININE 0.65 06/22/2014 0417   CALCIUM 9.2 04/12/2021 1517   CALCIUM 7.7 (L) 06/22/2014 0417   GFRNONAA >60 04/14/2021 0943   GFRNONAA >60 06/22/2014 0417   GFRNONAA >60 02/26/2013 1510   GFRAA 107 04/28/2020 1143   GFRAA >60 06/22/2014 0417   GFRAA >60 02/26/2013 1510   CrCl cannot be calculated (Patient's most recent lab result is older than the maximum 21 days allowed.).  COAG Lab Results  Component Value Date   INR 0.8 02/12/2020   INR 0.9 05/17/2019   INR 1.0 03/23/2019    Radiology DG Chest 2 View  Result Date: 12/16/2021 CLINICAL DATA:  Leg swelling.  Abnormal exam.  Bibasilar crackles. EXAM: CHEST - 2 VIEW COMPARISON:  02/12/2020 FINDINGS: Heart is UPPER limits normal but also accentuated by technique. There are no focal consolidations or pleural effusions. No pulmonary edema. There is atherosclerotic calcification of the abdominal aorta. Partially imaged abdominal vascular stents. IMPRESSION: No evidence for acute  abnormality. Electronically Signed   By: Nolon Nations M.D.   On: 12/16/2021 17:06     Assessment/Plan 1. Lymphedema Recommend:  No surgery or intervention at this point in time.    I have reviewed my discussion with the patient regarding lymphedema and why it  causes symptoms.  Patient will continue wearing graduated compression on a daily basis. The patient should put the compression on first thing in the morning and removing them in the evening. The patient should not sleep in the compression.   In addition, behavioral modification throughout the day will be continued.  This will include frequent elevation (such as in a recliner), use of over the counter pain medications as needed and exercise such as walking.  The systemic causes for chronic edema such as liver, kidney and cardiac etiologies do not appear to have significant changed over the past year.    Despite  conservative treatments including graduated compression therapy class 1 and behavioral modification including exercise and elevation the patient  has not obtained adequate control of the lymphedema.  The patient still has stage 3 lymphedema and therefore, I believe that a lymph pump should be added to improve the control of the patient's lymphedema.  Additionally, a lymph pump is warranted because it will reduce the risk of cellulitis and ulceration in the future.  Patient should follow-up in six months    2. Bilateral carotid artery stenosis Recommend:   Given the patient's asymptomatic subcritical stenosis no further invasive testing or surgery at this time.   Duplex ultrasound shows 1-39% stenosis bilaterally.   Continue antiplatelet therapy as prescribed Continue management of CAD, HTN and Hyperlipidemia Healthy heart diet,  encouraged exercise at least 4 times per week Follow up in 12 months with duplex ultrasound and physical exam    3. Peripheral vascular disease ([place])  Recommend:  The patient has evidence of atherosclerosis of the right lower extremities.  The patient does not voice lifestyle limiting changes at this point in time.  No invasive studies, angiography or surgery at this time The patient should continue walking and begin a more formal exercise program.  The patient should continue antiplatelet therapy and aggressive treatment of the lipid abnormalities  No changes in the patient's medications at this time  Continued surveillance is indicated as atherosclerosis is likely to progress with time.    The patient will continue follow up with noninvasive studies as  ordered.    4. Mesenteric ischemia (HCC) Recommend:   The patient is status post successful angiogram with intervention of the mesenteric vessels.     The patient reports that the abdominal pain is improved and the post prandial symptoms are essentially gone.   The patient denies lifestyle limiting changes  at this point in time.   No further invasive studies, angiography or surgery at this time The patient should continue walking and begin a more formal exercise program.  The patient should continue antiplatelet therapy and aggressive treatment of the lipid abnormalities   Patient should undergo noninvasive studies as ordered. The patient will follow up with me after the studies  5. Arteriosclerosis of coronary artery Continue cardiac and antihypertensive medications as already ordered and reviewed, no changes at this time.  Continue statin as ordered and reviewed, no changes at this time  Nitrates PRN for chest pain     Hortencia Pilar, MD  12/31/2021 1:52 PM

## 2022-01-01 DIAGNOSIS — M75111 Incomplete rotator cuff tear or rupture of right shoulder, not specified as traumatic: Secondary | ICD-10-CM | POA: Diagnosis not present

## 2022-01-01 DIAGNOSIS — M7581 Other shoulder lesions, right shoulder: Secondary | ICD-10-CM | POA: Diagnosis not present

## 2022-01-02 DIAGNOSIS — I890 Lymphedema, not elsewhere classified: Secondary | ICD-10-CM | POA: Insufficient documentation

## 2022-01-27 ENCOUNTER — Other Ambulatory Visit: Payer: Self-pay | Admitting: Family Medicine

## 2022-01-28 ENCOUNTER — Other Ambulatory Visit: Payer: Self-pay | Admitting: Family Medicine

## 2022-01-28 ENCOUNTER — Telehealth: Payer: Self-pay | Admitting: Family Medicine

## 2022-01-28 NOTE — Telephone Encounter (Signed)
Requested medication (s) are due for refill today: yes  Requested medication (s) are on the active medication list: no  Last refill:  Probiotic/Prebiotic last RF 07/22/20 #40 (historical med/provider)                  Vitamin C last RF 10/15/2014 (hx med and provider)                  Caroleen Hamman Last RF 07/22/20 (hx med and provider)                  Aspirin [facility] 02/15/20 hospital Discharge                    Future visit scheduled: no  Notes to clinic:  probiotic and prebiotic not assigned to protocol   Requested Prescriptions  Pending Prescriptions Disp Refills   Bacillus Coagulans-Inulin (PROBIOTIC-PREBIOTIC) 1-250 BILLION-MG CAPS [Pharmacy Med Name: PROBIOTIC-PREBIOTIC 1-250 BILLION-M] 40 capsule     Sig: TAKE 1 CAPSULE BY MOUTH EVERY DAY     Off-Protocol Failed - 01/27/2022  4:20 PM      Failed - Medication not assigned to a protocol, review manually.      Passed - Valid encounter within last 12 months    Recent Outpatient Visits           1 month ago Leg swelling   [facility] [place], [place], [place]   1 month ago Leg swelling   [facility] [place], [place], [place]   2 months ago Pain and swelling of right lower leg   [facility] Jer[place] Banana., MD   5 months ago Essential (primary) hypertension   [facility], Dionne Bucy, MD   8 months ago Essential (primary) hypertension   [facility], Dionne Bucy, MD               Ascorbic Acid (VITAMIN C) 1000 MG tablet [Pharmacy Med Name: VITAMIN C 1000 MG TAB] 100 tablet     Sig: TAKE 1 TABLET BY MOUTH EVERY MORNING     Endocrinology:  Vitamins Passed - 01/27/2022  4:20 PM      Passed - Valid encounter within last 12 months    Recent Outpatient Visits           1 month ago Leg swelling   [facility] [place], [place], [name]   1 month ago Leg swelling   [facility] [place], [place], [name]   2 months ago Pain and  swelling of right lower leg   [facility] Jerrol Banana., MD   5 months ago Essential (primary) hypertension   [facility] [name], Dionne Bucy, MD   8 months ago Essential (primary) hypertension   [facility] [name], Dionne Bucy, MD               FEROSUL 325 (65 Fe) MG tablet [Pharmacy Med Name: FEROSUL 325 (65 FE) MG TAB] 100 tablet     Sig: TAKE 1 TABLET BY [place] DAY     Endocrinology:  Minerals - Iron Supplementation Failed - 01/27/2022  4:20 PM      Failed - Fe (serum) in normal range and within 360 days    Iron  Date Value Ref Range Status  11/21/2019 39 27 - 139 ug/dL Final  02/22/2019 <10  Final   Iron Saturation  Date Value Ref Range Status  11/21/2019 11 (L) 15 - 55 %  Final         Failed - Ferritin in normal range and within 360 days    Ferritin  Date Value Ref Range Status  11/21/2019 73 15 - 150 ng/mL Final  02/22/2019 108  Final         Passed - HGB in normal range and within 360 days    Hemoglobin  Date Value Ref Range Status  12/16/2021 13.2 11.1 - 15.9 g/dL Final         Passed - HCT in normal range and within 360 days    Hematocrit  Date Value Ref Range Status  12/16/2021 40.2 34.0 - 46.6 % Final         Passed - RBC in normal range and within 360 days    RBC  Date Value Ref Range Status  12/16/2021 4.26 3.77 - 5.28 x10E6/uL Final  04/14/2021 4.35 3.87 - 5.11 MIL/uL Final         Passed - Valid encounter within last 12 months    Recent Outpatient Visits           1 month ago Leg swelling   [facility] [place], [place], [name]   1 month ago Leg swelling   [facility] [place], [place], [name]   2 months ago Pain and swelling of right lower leg   [facility] Jerrol Banana., MD   5 months ago Essential (primary) hypertension   [facility], Dionne Bucy, MD   8 months ago Essential (primary) hypertension    [facility], Dionne Bucy, MD               BAYER LOW DOSE 81 MG tablet [Pharmacy Med Name: BAYER LOW DOSE 81 MG DR TAB] 120 tablet     Sig: TAKE 1 TABLET BY MOUTH EVERY MORNING     Analgesics:  NSAIDS - aspirin Passed - 01/27/2022  4:20 PM      Passed - Cr in normal range and within 360 days    Creatinine  Date Value Ref Range Status  06/22/2014 0.65 0.60 - 1.30 mg/dL Final   Creatinine, Ser  Date Value Ref Range Status  04/14/2021 0.49 0.44 - 1.00 mg/dL Final         Passed - eGFR is 10 or above and within 360 days    EGFR (African American)  Date Value Ref Range Status  06/22/2014 >60 >70m/min Final  02/26/2013 >60  Final   GFR calc Af Amer  Date Value Ref Range Status  04/28/2020 107 >59 mL/min/1.73 Final    Comment:    **In accordance with recommendations from the NKF-ASN Task force,**   [facility] is in the process of updating its eGFR calculation to the   2021 CKD-EPI creatinine equation that estimates kidney function   without a race variable.    EGFR (Non-African Amer.)  Date Value Ref Range Status  06/22/2014 >60 >613mmin Final    Comment:    eGFR values <6027min/1.73 m2 may be an indication of chronic kidney disease (CKD). Calculated eGFR, using the MRDR Study equation, is useful in  patients with stable renal function. The eGFR calculation will not be reliable in acutely ill patients when serum creatinine is changing rapidly. It is not useful in patients on dialysis. The eGFR calculation may not be applicable to patients at the low and high extremes of body sizes, pregnant women, and vegetarians.   02/26/2013 >60  Final    Comment:  eGFR values <41m/min/1.73 m2 may be an indication of chronic kidney disease (CKD). Calculated eGFR is useful in patients with stable renal function. The eGFR calculation will not be reliable in acutely ill patients when serum creatinine is changing rapidly. It is not useful in  patients on  dialysis. The eGFR calculation may not be applicable to patients at the low and high extremes of body sizes, pregnant women, and vegetarians.    GFR, Estimated  Date Value Ref Range Status  04/14/2021 >60 >60 mL/min Final    Comment:    (NOTE) Calculated using the CKD-EPI Creatinine Equation (2021) [facility] [address] [place] [id number]         Passed - Patient is not pregnant      Passed - Valid encounter within last 12 months    Recent Outpatient Visits           [facility] month ago [facility] swelling   [facility] [place]  1 month ago Leg swelling   [place]  2 months ago Pain and swelling of right lower leg   [facility]   5 months ago Essential (primary) hypertension   [facility]   8 months ago Essential (primary) hypertension   [facility]

## 2022-02-11 ENCOUNTER — Other Ambulatory Visit: Payer: Self-pay | Admitting: Family Medicine

## 2022-02-16 ENCOUNTER — Other Ambulatory Visit (INDEPENDENT_AMBULATORY_CARE_PROVIDER_SITE_OTHER): Payer: Self-pay | Admitting: Vascular Surgery

## 2022-02-16 DIAGNOSIS — I739 Peripheral vascular disease, unspecified: Secondary | ICD-10-CM

## 2022-02-17 ENCOUNTER — Encounter (INDEPENDENT_AMBULATORY_CARE_PROVIDER_SITE_OTHER): Payer: Self-pay | Admitting: Nurse Practitioner

## 2022-02-17 ENCOUNTER — Ambulatory Visit (INDEPENDENT_AMBULATORY_CARE_PROVIDER_SITE_OTHER): Payer: PPO | Admitting: Nurse Practitioner

## 2022-02-17 ENCOUNTER — Ambulatory Visit (INDEPENDENT_AMBULATORY_CARE_PROVIDER_SITE_OTHER): Payer: PPO

## 2022-02-17 VITALS — BP 157/76 | HR 84 | Resp 16

## 2022-02-17 DIAGNOSIS — I10 Essential (primary) hypertension: Secondary | ICD-10-CM

## 2022-02-17 DIAGNOSIS — I739 Peripheral vascular disease, unspecified: Secondary | ICD-10-CM | POA: Diagnosis not present

## 2022-02-17 DIAGNOSIS — I890 Lymphedema, not elsewhere classified: Secondary | ICD-10-CM | POA: Diagnosis not present

## 2022-02-18 ENCOUNTER — Encounter (INDEPENDENT_AMBULATORY_CARE_PROVIDER_SITE_OTHER): Payer: Self-pay | Admitting: Nurse Practitioner

## 2022-02-18 NOTE — Progress Notes (Signed)
Subjective:    Patient ID: Misty Page, female    DOB: March 10, 1941, 81 y.o.   MRN: [id number] Chief Complaint  Patient presents with   Follow-up    Ultrasound follow up    The patient returns today for follow-up studies of her right lower extremity in regards to concern for possible peripheral arterial disease.  The patient was recently seen for annual follow-ups for mesenteric stenosis.  The patient has severe lymphedema in her right lower extremity.  She has a history of a right above-knee amputation.  She notes that since her most recent office visit she has been wearing compression wraps and her swelling is much better controlled although not completely resolved.  She still continues to have some tenderness in the area.  Today noninvasive studies show an ABI of 0.97 on the right with a TBI 0.63.  Patient has biphasic tibial artery waveforms with normal toe waveforms.    Review of Systems  Cardiovascular:  Positive for leg swelling.  All other systems reviewed and are negative.      Objective:   Physical Exam Vitals reviewed.  HENT:     Head: Normocephalic.  Cardiovascular:     Rate and Rhythm: Normal rate.     Pulses: Normal pulses.  Pulmonary:     Effort: Pulmonary effort is normal.  Musculoskeletal:     Left lower leg: Edema present.     Left Lower Extremity: Left leg is amputated above knee.  Skin:    General: Skin is warm and dry.  Neurological:     Mental Status: She is alert and oriented to person, place, and time.     Gait: Gait abnormal.  Psychiatric:        Mood and Affect: Mood normal.        Behavior: Behavior normal.        Thought Content: Thought content normal.        Judgment: Judgment normal.     BP (!) 157/76 (BP Location: Right Arm)   Pulse 84   Resp 16   Past Medical History:  Diagnosis Date   Allergy    Anemia    Arthritis    CA of skin 10/15/2014   Calculus of kidney 10/15/2014   Seen in ER 06/16/10.    Colitis    Complication of  anesthesia    nausea   Complication of internal prosthetic device 01/27/2009   Coronary artery disease    Diverticulitis    Environmental and seasonal allergies    Fibrocystic breast disease (FCBD)    GERD (gastroesophageal reflux disease)    Heart murmur    High cholesterol    History of heart artery stent    Hyperlipidemia    Hypertension    Hyperthyroidism    Myocardial infarction [facility])    Osteomyelitis of left femur ([place]) 11/30/2018   Osteoporosis    Reactive depression 02/25/2019   Thyroid disease    TIA (transient ischemic attack)     Social History   Socioeconomic History   Marital status: Widowed    Spouse name: Konrad Dolores   Number of children: 1   Years of education: Not on file   Highest education level: 12th grade  Occupational History   Occupation: Emergency planning/management officer    Comment: retired  Tobacco Use   Smoking status: Former    Types: Cigarettes    Quit date: 05/30/1994    Years since quitting: 27.7   Smokeless tobacco: Never  Vaping Use  Vaping Use: Never used  Substance and Sexual Activity   Alcohol use: Not Currently    Comment: rare- 1 glass of wine EOW   Drug use: No   Sexual activity: Not Currently    Birth control/protection: None  Other Topics Concern   Not on file  Social History Narrative   Lives alone; gets assistance 3x/week-hired help   Social Determinants of Health   Financial Resource Strain: Low Risk  (12/22/2021)   Overall Financial Resource Strain (CARDIA)    Difficulty of Paying Living Expenses: Not hard at all  Food Insecurity: No Food Insecurity (12/22/2021)   Hunger Vital Sign    Worried About Running Out of Food in the Last Year: Never true    Ran Out of Food in the Last Year: Never true  Transportation Needs: No Transportation Needs (12/22/2021)   PRAPARE - Hydrologist (Medical): No    Lack of Transportation (Non-Medical): No  Physical Activity: Inactive (12/22/2021)   Exercise Vital Sign    Days of  Exercise per Week: 0 days    Minutes of Exercise per Session: 0 min  Stress: No Stress Concern Present (12/22/2021)   [place]    Feeling of Stress : Only a little  Social Connections: Socially Isolated (12/22/2021)   Social Connection and Isolation Panel [NHANES]    Frequency of Communication with Friends and Family: Three times a week    Frequency of Social Gatherings with Friends and Family: Once a week    Attends Religious Services: Never    Marine scientist or Organizations: No    Attends Archivist Meetings: Never    Marital Status: Widowed  Intimate Partner Violence: Not At Risk (12/22/2021)   Humiliation, Afraid, Rape, and Kick questionnaire    Fear of Current or Ex-Partner: No    Emotionally Abused: No    Physically Abused: No    Sexually Abused: No    Past Surgical History:  Procedure Laterality Date   ABDOMINAL HYSTERECTOMY  2007   BREAST ENHANCEMENT SURGERY  2004   BREAST IMPLANT REMOVAL     BREAST SURGERY  1984   Fibrocystic Disease   CAROTID ENDARTERECTOMY Left 06/21/2014   Dr. Delana Meyer   CAROTID ENDARTERECTOMY Right 08/02/2014   Dr. Delana Meyer   CAROTID PTA/STENT INTERVENTION N/A 06/15/2017   Procedure: CAROTID PTA/STENT INTERVENTION;  Surgeon: Katha Cabal, MD;  Location: [facility];  Service: Cardiovascular;  Laterality: N/A;   COLONOSCOPY WITH PROPOFOL N/A 07/14/2015   Procedure: COLONOSCOPY WITH PROPOFOL;  Surgeon: Hulen Luster, MD;  Location: [facility] ENDOSCOPY;  Service: Gastroenterology;  Laterality: N/A;   COLONOSCOPY WITH PROPOFOL N/A 02/08/2018   Procedure: COLONOSCOPY WITH PROPOFOL;  Surgeon: Toledo, Benay Pike, MD;  Location: [facility] ENDOSCOPY;  Service: Gastroenterology;  Laterality: N/A;   CORONARY ANGIOPLASTY WITH STENT PLACEMENT  1999   ESOPHAGOGASTRODUODENOSCOPY (EGD) WITH PROPOFOL N/A 02/08/2018   Procedure: ESOPHAGOGASTRODUODENOSCOPY (EGD) WITH PROPOFOL;  Surgeon:  Toledo, Benay Pike, MD;  Location: [facility] ENDOSCOPY;  Service: Gastroenterology;  Laterality: N/A;   FEMORAL ARTERY STENT  08/2003   HERNIA REPAIR  2008   LEG AMPUTATION AT HIP Left 02/28/2019   MASTECTOMY     RENAL ARTERY STENT  15/0569   TRANSUMBILICAL AUGMENTATION MAMMAPLASTY     VASCULAR SURGERY     VISCERAL ARTERY INTERVENTION N/A 02/13/2020   Procedure: VISCERAL ARTERY INTERVENTION;  Surgeon: Algernon Huxley, MD;  Location: [place]  [facility];  Service: Cardiovascular;  Laterality: N/A;    Family History  Problem Relation Age of Onset   Hyperlipidemia Mother    Congestive Heart Failure Mother    COPD Mother    Heart disease Mother    Hypertension Mother    Osteoporosis Mother    Pancreatic cancer Father    Arthritis Sister    Alzheimer's disease Sister    Hypertension Sister    Prostate cancer Brother    Heart attack Brother    Stomach cancer Brother    Kidney failure Brother    Leukemia Brother    Emphysema Brother    Pancreatic cancer Brother    Stomach cancer Brother    Breast cancer Paternal Grandmother    Leukemia Paternal Grandfather    Heart attack Maternal Uncle    Stroke Maternal Aunt     Allergies  Allergen Reactions   Amoxicillin Anaphylaxis   Naproxen Hives, Shortness Of Breath, Nausea And Vomiting and Swelling   Penicillins Shortness Of Breath, Swelling and Rash    Has patient had a PCN reaction causing immediate rash, facial/tongue/throat swelling, SOB or lightheadedness with hypotension: Yes Has patient had a PCN reaction causing severe rash involving mucus membranes or skin necrosis: Yes Has patient had a PCN reaction that required hospitalization: Yes Has patient had a PCN reaction occurring within the last 10 years: No  If all of the above answers are "NO", then may proceed with Cephalosporin use.    Codeine Nausea And Vomiting   Levofloxacin Nausea And Vomiting   Shellfish Allergy Hives, Diarrhea, Nausea And Vomiting and Swelling       Latest  Ref Rng & Units 12/16/2021   10:34 AM 04/14/2021    9:43 AM 04/12/2021    3:17 PM  CBC  WBC 3.4 - 10.8 x10E3/uL 7.5  9.1  11.3   Hemoglobin 11.1 - 15.9 g/dL 13.2  13.9  15.0   Hematocrit 34.0 - 46.6 % 40.2  41.0  43.9   Platelets 150 - 450 x10E3/uL 350  294  345       CMP     Component Value Date/Time   NA 137 04/12/2021 1517   NA 137 07/22/2020 0000   NA 140 06/22/2014 0417   K 3.8 04/12/2021 1517   K 3.5 06/22/2014 0417   CL 101 04/12/2021 1517   CL 105 06/22/2014 0417   CO2 25 04/12/2021 1517   CO2 27 06/22/2014 0417   GLUCOSE 133 (H) 04/12/2021 1517   GLUCOSE 92 06/22/2014 0417   BUN 18 04/12/2021 1517   BUN 16 07/22/2020 0000   BUN 9 06/22/2014 0417   CREATININE 0.49 04/14/2021 0943   CREATININE 0.65 06/22/2014 0417   CALCIUM 9.2 04/12/2021 1517   CALCIUM 7.7 (L) 06/22/2014 0417   PROT 7.3 04/12/2021 1517   PROT 6.1 04/28/2020 1143   PROT 7.8 02/26/2013 1510   ALBUMIN 4.2 04/12/2021 1517   ALBUMIN 4.1 04/28/2020 1143   ALBUMIN 4.5 02/26/2013 1510   AST 22 04/12/2021 1517   AST 30 02/26/2013 1510   ALT 18 04/12/2021 1517   ALT 28 02/26/2013 1510   ALKPHOS 64 04/12/2021 1517   ALKPHOS 64 02/26/2013 1510   BILITOT 0.8 04/12/2021 1517   BILITOT 0.3 04/28/2020 1143   BILITOT 0.5 02/26/2013 1510   GFRNONAA >60 04/14/2021 0943   GFRNONAA >60 06/22/2014 0417   GFRNONAA >60 02/26/2013 1510   GFRAA 107 04/28/2020 1143   GFRAA >60 06/22/2014 0417  GFRAA >60 02/26/2013 1510     No results found.     Assessment & Plan:   1. PVD (peripheral vascular disease) ([place]) The patient has some minimal evidence of peripheral arterial disease. Currently no role for intervention.  Patient can follow-up with noninvasive studies annually. - VAS [place] ABI WITH/WO TBI  2. Lymphedema With the beginning of conservative therapy the patient's lower extremity edema is greatly controlled.  She has recently been fitted for her lymphedema pump.  She should receive this in the next  several weeks.  Patient is advised to continue with use of her compression wraps in addition to elevating her lower extremity and ambulating when possible.  We will have the patient return in approximately 3 to 4 months to follow-up and discuss progression with lymphedema pump  3. Essential (primary) hypertension Continue antihypertensive medications as already ordered, these medications have been reviewed and there are no changes at this time.    Current Outpatient Medications on File Prior to Visit  Medication Sig Dispense Refill   amLODipine (NORVASC) 5 MG tablet TAKE 1 TABLET BY MOUTH DAILY 90 tablet 0   Ascorbic Acid (VITAMIN C) 1000 MG tablet Take 1,000 mg by mouth daily.     BAYER LOW DOSE 81 MG tablet TAKE 1 TABLET BY MOUTH EVERY MORNING 120 tablet 2   clopidogrel (PLAVIX) 75 MG tablet TAKE 1 TABLET BY MOUTH DAILY 90 tablet 1   ezetimibe (ZETIA) 10 MG tablet TAKE 1 TABLET BY MOUTH DAILY 90 tablet 1   famotidine (PEPCID) 20 MG tablet TAKE 1 TABLET BY MOUTH TWICE A DAY 180 tablet 1   ferrous sulfate 325 (65 FE) MG EC tablet Take 325 mg by mouth daily.     fexofenadine (ALLEGRA) 180 MG tablet Take 180 mg by mouth at bedtime as needed.     fluticasone (FLONASE) 50 MCG/ACT nasal spray USE 2 SPRAYS EACH NOSTRIL DAILY 48 g 3   furosemide (LASIX) 20 MG tablet Take 1 tablet (20 mg total) by mouth 2 (two) times daily. 60 tablet 0   losartan (COZAAR) 100 MG tablet TAKE 1 TABLET BY MOUTH DAILY 90 tablet 1   meclizine (ANTIVERT) 25 MG tablet Take 1 tablet (25 mg total) by mouth 3 (three) times daily as needed for dizziness or nausea. 30 tablet 0   Mesalamine 800 MG TBEC Take 800 mg by mouth 2 (two) times daily.     methimazole (TAPAZOLE) 5 MG tablet Take 2.5 mg by mouth daily.      Probiotic Product (PROBIOTIC DAILY PO) Take 1 capsule by mouth daily.     rosuvastatin (CRESTOR) 20 MG tablet TAKE ONE-HALF TABLET BY MOUTH DAILY 90 tablet 0   Calcium-Vitamin D-Vitamin K 500-100-40 MG-UNT-MCG CHEW Chew  1 tablet by mouth 2 (two) times daily. VIACTIV, 500-100-40 (Oral Tablet Chewable)  1 tablet twice a day for 0 days  Quantity: 0.00;  Refills: 0   Ordered :13-Apr-2010  Celene Kras, MA, Anastasiya ;  Started 29-October-2008 Active Comments: DX: 733.00 (Patient not taking: Reported on 12/31/2021)     clindamycin (CLEOCIN) 300 MG capsule Take 1 capsule (300 mg total) by mouth 3 (three) times daily. (Patient not taking: Reported on 12/22/2021) 21 capsule 0   Cranberry-Cholecalciferol 4200-500 MG-UNIT CAPS Take by mouth. (Patient not taking: Reported on 12/22/2021)     ondansetron (ZOFRAN ODT) 4 MG disintegrating tablet Take 1 tablet (4 mg total) by mouth every 8 (eight) hours as needed for nausea or vomiting. (Patient not taking: Reported on  12/16/2021) 10 tablet 0   promethazine (PHENERGAN) 6.25 MG/5ML syrup Take 5 mLs (6.25 mg total) by mouth every 8 (eight) hours as needed for up to 7 days for nausea or vomiting. 105 mL 0   rosuvastatin (CRESTOR) 10 MG tablet Take 1 tablet (10 mg total) by mouth daily. (Patient not taking: Reported on 12/31/2021) 90 tablet 1   No current facility-administered medications on file prior to visit.    There are no Patient Instructions on file for this visit. No follow-ups on file.   Kris Hartmann, NP

## 2022-02-23 DIAGNOSIS — I739 Peripheral vascular disease, unspecified: Secondary | ICD-10-CM | POA: Diagnosis not present

## 2022-02-23 DIAGNOSIS — Z89612 Acquired absence of left leg above knee: Secondary | ICD-10-CM | POA: Diagnosis not present

## 2022-02-23 DIAGNOSIS — I10 Essential (primary) hypertension: Secondary | ICD-10-CM | POA: Diagnosis not present

## 2022-02-23 DIAGNOSIS — Z95820 Peripheral vascular angioplasty status with implants and grafts: Secondary | ICD-10-CM | POA: Diagnosis not present

## 2022-02-23 DIAGNOSIS — R42 Dizziness and giddiness: Secondary | ICD-10-CM | POA: Diagnosis not present

## 2022-03-29 ENCOUNTER — Encounter (INDEPENDENT_AMBULATORY_CARE_PROVIDER_SITE_OTHER): Payer: Self-pay

## 2022-04-02 DIAGNOSIS — M7581 Other shoulder lesions, right shoulder: Secondary | ICD-10-CM | POA: Diagnosis not present

## 2022-04-02 DIAGNOSIS — M75111 Incomplete rotator cuff tear or rupture of right shoulder, not specified as traumatic: Secondary | ICD-10-CM | POA: Diagnosis not present

## 2022-04-07 DIAGNOSIS — I890 Lymphedema, not elsewhere classified: Secondary | ICD-10-CM | POA: Diagnosis not present

## 2022-04-08 ENCOUNTER — Other Ambulatory Visit: Payer: Self-pay | Admitting: Family Medicine

## 2022-04-08 NOTE — Telephone Encounter (Signed)
Requested medication (s) are due for refill today - unsure  Requested medication (s) are on the active medication list -yes  Future visit scheduled -no  Last refill: 03/10/22  Notes to clinic: medication listed as historical, no protocol assigned- provider review   Requested Prescriptions  Pending Prescriptions Disp Refills   Bacillus Coagulans-Inulin (PROBIOTIC-PREBIOTIC) 1-250 BILLION-MG CAPS [Pharmacy Med Name: PROBIOTIC-PREBIOTIC 1-250 BILLION-M] 90 capsule     Sig: TAKE 1 CAPSULE BY MOUTH DAILY     Off-Protocol Failed - 04/08/2022 11:56 AM      Failed - Medication not assigned to a protocol, review manually.      Passed - Valid encounter within last 12 months    Recent Outpatient Visits           3 months ago Leg swelling   [facility] [place], [place], [place]   3 months ago Leg swelling   [facility] [place], [place], [place]   4 months ago Pain and swelling of right lower leg   [facility] Jerrol Banana., [facility]   7 months ago Essential (primary) hypertension   [facility], Dionne Bucy, MD   11 months ago Essential (primary) hypertension   [facility], Dionne Bucy, MD                 Requested Prescriptions  Pending Prescriptions Disp Refills   Bacillus Coagulans-Inulin (PROBIOTIC-PREBIOTIC) 1-250 BILLION-MG CAPS [Pharmacy Med Name: PROBIOTIC-PREBIOTIC 1-250 BILLION-M] 90 capsule     Sig: TAKE 1 CAPSULE BY MOUTH DAILY     Off-Protocol Failed - 04/08/2022 11:56 AM      Failed - Medication not assigned to a protocol, review manually.      Passed - Valid encounter within last 12 months    Recent Outpatient Visits           3 months ago Leg swelling   [facility] [place], [place], [place]   3 months ago Leg swelling   [facility] [place], [place], [place]   4 months ago Pain and swelling of right lower leg   [facility] Jerrol Banana., MD   7 months ago Essential (primary) hypertension   [facility], Dionne Bucy, MD   11 months ago Essential (primary) hypertension   [facility], Dionne Bucy, MD

## 2022-04-15 DIAGNOSIS — Z7901 Long term (current) use of anticoagulants: Secondary | ICD-10-CM | POA: Diagnosis not present

## 2022-04-15 DIAGNOSIS — C4922 Malignant neoplasm of connective and soft tissue of left lower limb, including hip: Secondary | ICD-10-CM | POA: Diagnosis not present

## 2022-04-15 DIAGNOSIS — K5730 Diverticulosis of large intestine without perforation or abscess without bleeding: Secondary | ICD-10-CM | POA: Diagnosis not present

## 2022-04-15 DIAGNOSIS — K5100 Ulcerative (chronic) pancolitis without complications: Secondary | ICD-10-CM | POA: Diagnosis not present

## 2022-04-15 DIAGNOSIS — R140 Abdominal distension (gaseous): Secondary | ICD-10-CM | POA: Diagnosis not present

## 2022-04-20 ENCOUNTER — Other Ambulatory Visit: Payer: Self-pay | Admitting: Family Medicine

## 2022-04-20 DIAGNOSIS — I10 Essential (primary) hypertension: Secondary | ICD-10-CM

## 2022-04-20 NOTE — Telephone Encounter (Signed)
Requested medication (s) are due for refill today - provider review   Requested medication (s) are on the active medication list -yes  Future visit scheduled -yes  Last refill: Rx on medication list date- 10/29/2008, Caroleen Hamman- 03/24/22  Notes to clinic: fails partial lab protocol, expired on Rx list  Requested Prescriptions  Pending Prescriptions Disp Refills   FEROSUL 325 (65 Fe) MG tablet [Pharmacy Med Name: FEROSUL 325 (65 FE) MG TAB] 90 tablet     Sig: TAKE 1 TABLET BY [place] DAY     Endocrinology:  Minerals - Iron Supplementation Failed - 04/20/2022  8:52 AM      Failed - Fe (serum) in normal range and within 360 days    Iron  Date Value Ref Range Status  11/21/2019 39 27 - 139 ug/dL Final  02/22/2019 <10  Final   Iron Saturation  Date Value Ref Range Status  11/21/2019 11 (L) 15 - 55 % Final         Failed - Ferritin in normal range and within 360 days    Ferritin  Date Value Ref Range Status  11/21/2019 73 15 - 150 ng/mL Final  02/22/2019 108  Final         Passed - HGB in normal range and within 360 days    Hemoglobin  Date Value Ref Range Status  12/16/2021 13.2 11.1 - 15.9 g/dL Final         Passed - HCT in normal range and within 360 days    Hematocrit  Date Value Ref Range Status  12/16/2021 40.2 34.0 - 46.6 % Final         Passed - RBC in normal range and within 360 days    RBC  Date Value Ref Range Status  12/16/2021 4.26 3.77 - 5.28 x10E6/uL Final  04/14/2021 4.35 3.87 - 5.11 MIL/uL Final         Passed - Valid encounter within last 12 months    Recent Outpatient Visits           3 months ago Leg swelling   [facility] [place], [place], [name]   4 months ago Leg swelling   [facility] [place], [place], [name]   5 months ago Pain and swelling of right lower leg   [name] Jerrol Banana., [place]   8 months ago Essential (primary) hypertension   [facility], Dionne Bucy,  MD   11 months ago Essential (primary) hypertension   [facility], Dionne Bucy, MD                 Requested Prescriptions  Pending Prescriptions Disp Refills   FEROSUL 325 (65 Fe) MG tablet [Pharmacy Med Name: FEROSUL 325 (65 FE) MG TAB] 90 tablet     Sig: TAKE 1 TABLET BY [place] DAY     Endocrinology:  Minerals - Iron Supplementation Failed - 04/20/2022  8:52 AM      Failed - Fe (serum) in normal range and within 360 days    Iron  Date Value Ref Range Status  11/21/2019 39 27 - 139 ug/dL Final  02/22/2019 <10  Final   Iron Saturation  Date Value Ref Range Status  11/21/2019 11 (L) 15 - 55 % Final         Failed - Ferritin in normal range and within 360 days    Ferritin  Date Value Ref Range Status  11/21/2019 73 15 - 150  ng/mL Final  02/22/2019 108  Final         Passed - HGB in normal range and within 360 days    Hemoglobin  Date Value Ref Range Status  12/16/2021 13.2 11.1 - 15.9 g/dL Final         Passed - HCT in normal range and within 360 days    Hematocrit  Date Value Ref Range Status  12/16/2021 40.2 34.0 - 46.6 % Final         Passed - RBC in normal range and within 360 days    RBC  Date Value Ref Range Status  12/16/2021 4.26 3.77 - 5.28 x10E6/uL Final  04/14/2021 4.35 3.87 - 5.11 MIL/uL Final         Passed - Valid encounter within last 12 months    Recent Outpatient Visits           3 months ago Leg swelling   [facility] [place], [place], [name]   4 months ago Leg swelling   [place], [place], [name]   5 months ago Pain and swelling of right lower leg   [facility] Jerrol Banana., MD   8 months ago Essential (primary) hypertension   [facility] [name], Dionne Bucy, MD   11 months ago Essential (primary) hypertension   [facility], Dionne Bucy, MD

## 2022-04-20 NOTE — Telephone Encounter (Signed)
Requested medication (s) are due for refill today: yes  Requested medication (s) are on the active medication list: yes  Last refill:  Plavix 10/12/21 #90/1, losartan 10/27/21 #90/1  Future visit scheduled: no  Notes to clinic:  Unable to refill per protocol due to failed labs, no updated results.    Requested Prescriptions  Pending Prescriptions Disp Refills   clopidogrel (PLAVIX) 75 MG tablet [Pharmacy Med Name: CLOPIDOGREL BISULFATE 75 MG TAB] 90 tablet 1    Sig: TAKE 1 TABLET BY MOUTH DAILY     Hematology: Antiplatelets - clopidogrel Failed - 04/20/2022  8:52 AM      Failed - Cr in normal range and within 360 days    Creatinine  Date Value Ref Range Status  06/22/2014 0.65 0.60 - 1.30 mg/dL Final   Creatinine, Ser  Date Value Ref Range Status  04/14/2021 0.49 0.44 - 1.00 mg/dL Final         Passed - HCT in normal range and within 180 days    Hematocrit  Date Value Ref Range Status  12/16/2021 40.2 34.0 - 46.6 % Final         Passed - HGB in normal range and within 180 days    Hemoglobin  Date Value Ref Range Status  12/16/2021 13.2 11.1 - 15.9 g/dL Final         Passed - PLT in normal range and within 180 days    Platelets  Date Value Ref Range Status  12/16/2021 350 150 - 450 x10E3/uL Final         Passed - Valid encounter within last 6 months    Recent Outpatient Visits           3 months ago Leg swelling   [facility] [place], [place], [name]   4 months ago Leg swelling   [facility] [place], [place], [name]   5 months ago Pain and swelling of right lower leg   [facility] Jerrol Banana., MD   8 months ago Essential (primary) hypertension   [facility] [place], Dionne Bucy, MD   11 months ago Essential (primary) hypertension   [facility] [facility], Dionne Bucy, MD               losartan (COZAAR) 100 MG tablet [Pharmacy Med Name: LOSARTAN POTASSIUM 100 MG TAB] 90 tablet 1     Sig: TAKE 1 TABLET BY MOUTH DAILY     Cardiovascular:  Angiotensin Receptor Blockers Failed - 04/20/2022  8:52 AM      Failed - Cr in normal range and within 180 days    Creatinine  Date Value Ref Range Status  06/22/2014 0.65 0.60 - 1.30 mg/dL Final   Creatinine, Ser  Date Value Ref Range Status  04/14/2021 0.49 0.44 - 1.00 mg/dL Final         Failed - K in normal range and within 180 days    Potassium  Date Value Ref Range Status  04/12/2021 3.8 3.5 - 5.1 mmol/L Final  06/22/2014 3.5 3.5 - 5.1 mmol/L Final         Failed - Last BP in normal range    BP Readings from Last 1 Encounters:  02/17/22 (!) 157/76         Passed - Patient is not pregnant      Passed - Valid encounter within last 6 months    Recent Outpatient Visits  3 months ago Leg swelling   [facility] [place], [place], [place]   4 months ago Leg swelling   [facility] [place], [place], [place]   5 months ago Pain and swelling of right lower leg   [facility] Jerrol Banana., MD   8 months ago Essential (primary) hypertension   [facility], Dionne Bucy, MD   11 months ago Essential (primary) hypertension   [facility], Dionne Bucy, MD

## 2022-05-07 ENCOUNTER — Other Ambulatory Visit: Payer: Self-pay | Admitting: Physician Assistant

## 2022-05-21 ENCOUNTER — Other Ambulatory Visit: Payer: Self-pay | Admitting: Family Medicine

## 2022-05-21 NOTE — Telephone Encounter (Signed)
Requested Prescriptions  Pending Prescriptions Disp Refills   famotidine (PEPCID) 20 MG tablet [Pharmacy Med Name: FAMOTIDINE 20 MG TAB] 180 tablet 0    Sig: TAKE 1 TABLET BY MOUTH TWICE A DAY     Gastroenterology:  H2 Antagonists Passed - 05/21/2022 11:27 AM      Passed - Valid encounter within last 12 months    Recent Outpatient Visits           4 months ago Leg swelling   [facility] [place], [place], [name]   5 months ago Leg swelling   [facility] [place], [place], [name]   6 months ago Pain and swelling of right lower leg   [facility] Jerrol Banana., MD   9 months ago Essential (primary) hypertension   [facility] [place], Dionne Bucy, MD   1 year ago Essential (primary) hypertension   [facility] [name], Dionne Bucy, MD               ezetimibe (ZETIA) 10 MG tablet [Pharmacy Med Name: EZETIMIBE 10 MG TAB] 90 tablet 0    Sig: TAKE 1 TABLET BY MOUTH DAILY     Cardiovascular:  Antilipid - Sterol Transport Inhibitors Failed - 05/21/2022 11:27 AM      Failed - AST in normal range and within 360 days    AST  Date Value Ref Range Status  04/12/2021 22 15 - 41 U/L Final   SGOT(AST)  Date Value Ref Range Status  02/26/2013 30 15 - 37 Unit/L Final         Failed - ALT in normal range and within 360 days    ALT  Date Value Ref Range Status  04/12/2021 18 0 - 44 U/L Final   SGPT (ALT)  Date Value Ref Range Status  02/26/2013 28 12 - 78 U/L Final         Failed - Lipid Panel in normal range within the last 12 months    Cholesterol, Total  Date Value Ref Range Status  05/08/2021 193 100 - 199 mg/dL Final   LDL Chol Calc (NIH)  Date Value Ref Range Status  05/08/2021 114 (H) 0 - 99 mg/dL Final   HDL  Date Value Ref Range Status  05/08/2021 65 >39 mg/dL Final   Triglycerides  Date Value Ref Range Status  05/08/2021 76 0 - 149 mg/dL Final         Passed - Patient is not pregnant       Passed - Valid encounter within last 12 months    Recent Outpatient Visits           4 months ago Leg swelling   [facility] [place], [place], [name]   5 months ago Leg swelling   [facility] [place], [place], [name]   6 months ago Pain and swelling of right lower leg   [facility] Jerrol Banana., MD   9 months ago Essential (primary) hypertension   [employer] [facility], Dionne Bucy, MD   1 year ago Essential (primary) hypertension   [employer] [facility], Dionne Bucy, MD               rosuvastatin (CRESTOR) 20 MG tablet [Pharmacy Med Name: ROSUVASTATIN CALCIUM 20 MG TAB] 90 tablet 0    Sig: TAKE ONE-HALF TABLET BY MOUTH DAILY     Cardiovascular:  Antilipid - Statins 2 Failed - 05/21/2022 11:27 AM  Failed - Cr in normal range and within 360 days    Creatinine  Date Value Ref Range Status  06/22/2014 0.65 0.60 - 1.30 mg/dL Final   Creatinine, Ser  Date Value Ref Range Status  04/14/2021 0.49 0.44 - 1.00 mg/dL Final         Failed - Lipid Panel in normal range within the last 12 months    Cholesterol, Total  Date Value Ref Range Status  05/08/2021 193 100 - 199 mg/dL Final   LDL Chol Calc (NIH)  Date Value Ref Range Status  05/08/2021 114 (H) 0 - 99 mg/dL Final   HDL  Date Value Ref Range Status  05/08/2021 65 >39 mg/dL Final   Triglycerides  Date Value Ref Range Status  05/08/2021 76 0 - 149 mg/dL Final         Passed - Patient is not pregnant      Passed - Valid encounter within last 12 months    Recent Outpatient Visits           4 months ago Leg swelling   [facility] [place], [place], [name]   5 months ago Leg swelling   [place], [place], [name]   6 months ago Pain and swelling of right lower leg   [facility] Jerrol Banana., MD   9 months ago Essential (primary) hypertension   [employer], Dionne Bucy, MD   1 year ago Essential (primary) hypertension   [facility], Dionne Bucy, MD

## 2022-05-26 DIAGNOSIS — E0590 Thyrotoxicosis, unspecified without thyrotoxic crisis or storm: Secondary | ICD-10-CM | POA: Diagnosis not present

## 2022-06-01 ENCOUNTER — Other Ambulatory Visit (INDEPENDENT_AMBULATORY_CARE_PROVIDER_SITE_OTHER): Payer: Self-pay | Admitting: Nurse Practitioner

## 2022-06-01 DIAGNOSIS — K551 Chronic vascular disorders of intestine: Secondary | ICD-10-CM

## 2022-06-02 DIAGNOSIS — E0590 Thyrotoxicosis, unspecified without thyrotoxic crisis or storm: Secondary | ICD-10-CM | POA: Diagnosis not present

## 2022-06-02 DIAGNOSIS — E042 Nontoxic multinodular goiter: Secondary | ICD-10-CM | POA: Diagnosis not present

## 2022-06-04 ENCOUNTER — Other Ambulatory Visit: Payer: Self-pay | Admitting: Family Medicine

## 2022-06-04 NOTE — Telephone Encounter (Signed)
Requested medication (s) are due for refill today: routing for review  Requested medication (s) are on the active medication list: no  Last refill:  10/15/14  Future visit scheduled: yes  Notes to clinic:  Unable to refill per protocol, last refill by another provider. Historical provider, routing for review     Requested Prescriptions  Pending Prescriptions Disp Refills   Ascorbic Acid (VITAMIN C) 1000 MG tablet [Pharmacy Med Name: VITAMIN C 1000 MG TAB] 100 tablet     Sig: TAKE 1 TABLET BY MOUTH EVERY MORNING     Endocrinology:  Vitamins Passed - 06/04/2022 10:54 AM      Passed - Valid encounter within last 12 months    Recent Outpatient Visits           5 months ago Leg swelling   [facility] [place], [place], [name]   5 months ago Leg swelling   [facility] [place], [place], [name]   6 months ago Pain and swelling of right lower leg   [facility] Jerrol Banana., MD   9 months ago Essential (primary) hypertension   [facility] [name], Dionne Bucy, MD   1 year ago Essential (primary) hypertension   [facility], Dionne Bucy, MD

## 2022-06-07 ENCOUNTER — Encounter (INDEPENDENT_AMBULATORY_CARE_PROVIDER_SITE_OTHER): Payer: PPO

## 2022-06-07 ENCOUNTER — Ambulatory Visit (INDEPENDENT_AMBULATORY_CARE_PROVIDER_SITE_OTHER): Payer: PPO | Admitting: Vascular Surgery

## 2022-06-21 ENCOUNTER — Other Ambulatory Visit: Payer: Self-pay

## 2022-06-21 DIAGNOSIS — I2510 Atherosclerotic heart disease of native coronary artery without angina pectoris: Secondary | ICD-10-CM

## 2022-06-21 DIAGNOSIS — I739 Peripheral vascular disease, unspecified: Secondary | ICD-10-CM

## 2022-06-21 DIAGNOSIS — I10 Essential (primary) hypertension: Secondary | ICD-10-CM

## 2022-06-21 DIAGNOSIS — E7800 Pure hypercholesterolemia, unspecified: Secondary | ICD-10-CM

## 2022-07-01 ENCOUNTER — Telehealth: Payer: Self-pay

## 2022-07-01 NOTE — Progress Notes (Signed)
Care Management & Coordination Services Pharmacy Team Pharmacy Assistant   Name: DAVIE SAGONA  MRN: [id number] DOB: [birth year]  Reason for Encounter: Appointment Reminder  Chart review: Conditions to be addressed/monitored: HTN, Allergic Rhinitis, Osteoporosis, and Atherosclerotic Heart Disease, Hemorrhoids w/o complication, Aortic Heart Valve Narrowing, Arteriosclerosis of Coronary Artery, PVD, Bilateral Carotid Artery Stenosis, Innominate Artery Stenosis, Mesenteric Ischemia, Bilateral Carotid Artery Disease, Acid Reflux, Hyperthyroidism, Hypercholesteremia,   Recent office visits:  None ID  Recent consult visits:  06/02/2022 Toni Arthurs, MD (Endocrinology) I am unable to view this note  04/15/2022 Kathline Magic, MD (Gastroenterology) for Bloating- No medication changes noted, No orders placed, BP: 173/86, Patient instructed to follow-up in 1 year  04/02/2022 Benjamine Sprague, MD (Ortho Surgery) - I am unable to view this note  02/17/2022 Eulogio Ditch, NP (Vascular Surgery) for Follow-up- No medication changes noted, VAS [place] ABI W/WO TBI order placed, BP: 157/76, No follow-up noted  01/01/2022 Lossie Faes, MD (Ortho Surgery) for Follow-up-No medication changes noted, No orders placed, Patient to follow-up in 3 months  12/31/2021 Hortencia Pilar, MD (Vascular Surgery) for Bilateral Carotid Artery Stenosis- No medication changes noted, No orders placed, Patient instructed to follow-up in 6 months  Hospital visits:  None in previous 6 months  Medications: Outpatient Encounter Medications as of 07/01/2022  Medication Sig   amLODipine (NORVASC) 5 MG tablet TAKE 1 TABLET BY MOUTH DAILY   Ascorbic Acid (VITAMIN C) 1000 MG tablet TAKE 1 TABLET BY MOUTH EVERY MORNING   Bacillus Coagulans-Inulin (PROBIOTIC-PREBIOTIC) 1-250 BILLION-MG CAPS TAKE 1 CAPSULE BY MOUTH DAILY   BAYER LOW DOSE 81 MG tablet TAKE 1 TABLET BY MOUTH EVERY MORNING   Calcium-Vitamin D-Vitamin  K 500-100-40 MG-UNT-MCG CHEW Chew 1 tablet by mouth 2 (two) times daily. VIACTIV, 500-100-40 (Oral Tablet Chewable)  1 tablet twice a day for 0 days  Quantity: 0.00;  Refills: 0   Ordered :13-Apr-2010  Celene Kras, MA, Anastasiya ;  Started 29-October-2008 Active Comments: DX: 733.00 (Patient not taking: Reported on 12/31/2021)   clindamycin (CLEOCIN) 300 MG capsule Take 1 capsule (300 mg total) by mouth 3 (three) times daily. (Patient not taking: Reported on 12/22/2021)   clopidogrel (PLAVIX) 75 MG tablet TAKE 1 TABLET BY MOUTH DAILY   Cranberry-Cholecalciferol 4200-500 MG-UNIT CAPS Take by mouth. (Patient not taking: Reported on 12/22/2021)   ezetimibe (ZETIA) 10 MG tablet TAKE 1 TABLET BY MOUTH DAILY   famotidine (PEPCID) 20 MG tablet TAKE 1 TABLET BY MOUTH TWICE A DAY   ferrous sulfate 325 (65 FE) MG EC tablet Take 325 mg by mouth daily.   fexofenadine (ALLEGRA) 180 MG tablet Take 180 mg by mouth at bedtime as needed.   fluticasone (FLONASE) 50 MCG/ACT nasal spray USE 2 SPRAYS EACH NOSTRIL DAILY   furosemide (LASIX) 20 MG tablet Take 1 tablet (20 mg total) by mouth 2 (two) times daily.   losartan (COZAAR) 100 MG tablet TAKE 1 TABLET BY MOUTH DAILY   meclizine (ANTIVERT) 25 MG tablet Take 1 tablet (25 mg total) by mouth 3 (three) times daily as needed for dizziness or nausea.   Mesalamine 800 MG TBEC Take 800 mg by mouth 2 (two) times daily.   methimazole (TAPAZOLE) 5 MG tablet Take 2.5 mg by mouth daily.    ondansetron (ZOFRAN ODT) 4 MG disintegrating tablet Take 1 tablet (4 mg total) by mouth every 8 (eight) hours as needed for nausea or vomiting. (Patient not taking: Reported on 12/16/2021)   Probiotic Product (PROBIOTIC DAILY  PO) Take 1 capsule by mouth daily.   promethazine (PHENERGAN) 6.25 MG/5ML syrup Take 5 mLs (6.25 mg total) by mouth every 8 (eight) hours as needed for up to 7 days for nausea or vomiting.   rosuvastatin (CRESTOR) 10 MG tablet Take 1 tablet (10 mg total) by mouth daily. (Patient  not taking: Reported on 12/31/2021)   rosuvastatin (CRESTOR) 20 MG tablet TAKE ONE-HALF TABLET BY MOUTH DAILY   No facility-administered encounter medications on file as of 07/01/2022.   Contacted patient to confirm in office appointment with [name], CPP, PharmD on 07/05/2022 at 0930.  Spoke with patient on 07/02/2022   Do you have any problems getting your medications? Yes If yes what types of problems are you experiencing? Financial barriers patient stated there is only one medication that she has a financial barriers with and that is her Mesalamine 800 mg tablet   What is your top health concern you would like to discuss at your upcoming visit? Patient reports that she has a lot of health issues. Per patient she does not have a certain concern but she does have a lot of problems with her health.  Have you seen any other providers since your last visit with PCP? Yes patient has seen several specialist  Star Rating Drugs:  Losartan 100 mg last filled on 06/17/2022 for a 30-Day supply with [facility] Rosuvastatin 20 mg last filled on 06/17/2022 for a 30-Day supply with [facility] Gaps: Annual wellness visit in last year? Yes   I spoke with patient and she reports that her medications are delivered to her home via her pharmacy. Per patient she is in a wheel chair as she is an amputee. Patient has a caregiver that comes to her home a few times a week. Patient does take her blood pressure in home but not often. Per patient her blood pressure normally runs more on the higher side. Patient is able to get to her appointment with CPP on Monday as that is one of the days her Caregiver comes.   [name], CPA/CMA Clinical Pharmacist Assistant Phone: [phone]

## 2022-07-02 DIAGNOSIS — M7581 Other shoulder lesions, right shoulder: Secondary | ICD-10-CM | POA: Diagnosis not present

## 2022-07-02 DIAGNOSIS — M75111 Incomplete rotator cuff tear or rupture of right shoulder, not specified as traumatic: Secondary | ICD-10-CM | POA: Diagnosis not present

## 2022-07-05 ENCOUNTER — Ambulatory Visit: Payer: PPO

## 2022-07-05 VITALS — BP 124/76 | HR 88

## 2022-07-05 DIAGNOSIS — I10 Essential (primary) hypertension: Secondary | ICD-10-CM

## 2022-07-05 DIAGNOSIS — K5190 Ulcerative colitis, unspecified, without complications: Secondary | ICD-10-CM

## 2022-07-05 DIAGNOSIS — I252 Old myocardial infarction: Secondary | ICD-10-CM

## 2022-07-05 DIAGNOSIS — M810 Age-related osteoporosis without current pathological fracture: Secondary | ICD-10-CM

## 2022-07-05 DIAGNOSIS — E7800 Pure hypercholesterolemia, unspecified: Secondary | ICD-10-CM

## 2022-07-05 DIAGNOSIS — Z8673 Personal history of transient ischemic attack (TIA), and cerebral infarction without residual deficits: Secondary | ICD-10-CM

## 2022-07-05 DIAGNOSIS — I25118 Atherosclerotic heart disease of native coronary artery with other forms of angina pectoris: Secondary | ICD-10-CM

## 2022-07-05 MED ORDER — CALCIUM CARB-CHOLECALCIFEROL 600-20 MG-MCG PO TABS: 1.0000 | ORAL_TABLET | Freq: Every day | ORAL | 11 refills | Status: DC

## 2022-07-05 NOTE — Progress Notes (Signed)
Care Management & Coordination Services Pharmacy Note  07/05/2022 Name:  Misty Page MRN:  [id number] DOB:  1940-06-12  Summary: Patient presents for initial consult.   Does not monitor blood pressure routinely at home. Office blood pressure controlled today.  -Patient unable to tolerate higher doses of rosuvastatin or atorvastatin. Patient states that "she has never had good cholesterol." Given very high CV risk, would recommend PCSK9 inhibitor to help target LDL goal of at least <70. Patient was amenable to trying this option.    -Patient previously received treatment for osteoporosis with Fosamax reports she had some issues at the time with tolerability. She was stopped 10+ years ago, likely for a drug holiday. Her bone density has not been rechecked since then.   -Patient reports affordability concerns with mesalamine. She has not tolerated lower doses of mesalamine or sulfasalazine.   Recommendations/Changes made from today's visit: PCP follow-up scheduled for 07/23/22. - START Calcium + D3 combination once daily - Recheck DEXA  -START PAP for Mesalamine  -Recommend rechecking FLP at PCP follow-up Recommend stopping Zetia, starting Repatha 140 mg every 2 weeks after PCP follow-up.  Follow up plan: CPP follow-up 3 months   Subjective: Misty Page is an 82 y.o. year old female who is a primary patient of [name], Dionne Bucy, MD.  The care coordination team was consulted for assistance with disease management and care coordination needs.    Engaged with patient by telephone for initial visit.  Recent office visits: 12/23/21: Patient presented to Mardene Speak, [name] for leg swelling.  08/21/21: Patient presented to Dr. Brita Romp for follow-up.  Recent consult visits: 06/02/2022 Toni Arthurs, MD (Endocrinology) I am unable to view this note   04/15/2022 Kathline Magic, MD (Gastroenterology) for Bloating- No medication changes noted, No orders placed, BP:  173/86, Patient instructed to follow-up in 1 year   04/02/2022 Benjamine Sprague, MD (Ortho Surgery) - I am unable to view this note   02/17/2022 Eulogio Ditch, NP (Vascular Surgery) for Follow-up- No medication changes noted, VAS [place] ABI W/WO TBI order placed, BP: 157/76, No follow-up noted   01/01/2022 Lossie Faes, MD (Ortho Surgery) for Follow-up-No medication changes noted, No orders placed, Patient to follow-up in 3 months   12/31/2021 Hortencia Pilar, MD (Vascular Surgery) for Bilateral Carotid Artery Stenosis- No medication changes noted, No orders placed, Patient instructed to follow-up in 6 months  Hospital visits: None in previous 6 months   Objective:  Lab Results  Component Value Date   CREATININE 0.49 04/14/2021   BUN 18 04/12/2021   GFRNONAA >60 04/14/2021   GFRAA 107 04/28/2020   NA 137 04/12/2021   K 3.8 04/12/2021   CALCIUM 9.2 04/12/2021   CO2 25 04/12/2021   GLUCOSE 133 (H) 04/12/2021    No results found for: "HGBA1C", "FRUCTOSAMINE", "GFR", "MICROALBUR"  Last diabetic Eye exam: No results found for: "HMDIABEYEEXA"  Last diabetic Foot exam: No results found for: "HMDIABFOOTEX"   Lab Results  Component Value Date   CHOL 193 05/08/2021   HDL 65 05/08/2021   LDLCALC 114 (H) 05/08/2021   TRIG 76 05/08/2021   CHOLHDL 3.0 05/08/2021       Latest Ref Rng & Units 04/12/2021    3:17 PM 07/22/2020   12:00 AM 04/28/2020   11:43 AM  Hepatic Function  Total Protein 6.5 - 8.1 g/dL 7.3   6.1   Albumin 3.5 - 5.0 g/dL 4.2  4.1     4.1   AST  15 - 41 U/L '22  12     14   '$ ALT 0 - 44 U/L '18  13     14   '$ Alk Phosphatase 38 - 126 U/L 64  62     68   Total Bilirubin 0.3 - 1.2 mg/dL 0.8   0.3      This result is from an external source.    Lab Results  Component Value Date/Time   TSH 0.22 (A) 08/06/2020 12:00 AM   TSH 0.269 (L) 10/22/2019 11:11 AM   TSH <0.010 (L) 05/17/2019 12:27 PM   TSH 1.17 02/25/2019 12:00 AM   TSH 0.022 (L) 12/22/2016 09:35 AM    FREET4 1.32 (H) 05/17/2019 12:27 PM       Latest Ref Rng & Units 12/16/2021   10:34 AM 04/14/2021    9:43 AM 04/12/2021    3:17 PM  CBC  WBC 3.4 - 10.8 x10E3/uL 7.5  9.1  11.3   Hemoglobin 11.1 - 15.9 g/dL 13.2  13.9  15.0   Hematocrit 34.0 - 46.6 % 40.2  41.0  43.9   Platelets 150 - 450 x10E3/uL 350  294  345     Lab Results  Component Value Date/Time   VITAMINB12 487 11/21/2019 10:43 AM   VITAMINB12 greater than 1,500 02/22/2019 12:00 AM    Clinical ASCVD: Yes  The ASCVD Risk score (Arnett DK, et al., 2019) failed to calculate for the following reasons:   The 2019 ASCVD risk score is only valid for ages 49 to 41   The patient has a prior MI or stroke diagnosis       12/22/2021    9:08 AM 08/21/2021   10:02 AM 05/04/2021   10:57 AM  Depression screen PHQ 2/9  Decreased Interest '1 1 1  '$ Down, Depressed, Hopeless 1 1 0  PHQ - 2 Score '2 2 1  '$ Altered sleeping '1 1 1  '$ Tired, decreased energy '1 1 1  '$ Change in appetite 0 0 0  Feeling bad or failure about yourself  0 0 0  Trouble concentrating 0 0 0  Moving slowly or fidgety/restless 0 0 0  Suicidal thoughts 0 0 0  PHQ-9 Score '4 4 3  '$ Difficult doing work/chores Not difficult at all Not difficult at all Not difficult at all     Social History   Tobacco Use  Smoking Status Former   Types: Cigarettes   Quit date: 05/30/1994   Years since quitting: 28.1  Smokeless Tobacco Never   BP Readings from Last 3 Encounters:  07/05/22 124/76  02/17/22 (!) 157/76  12/31/21 (!) 182/84   Pulse Readings from Last 3 Encounters:  07/05/22 88  02/17/22 84  12/31/21 80   Wt Readings from Last 3 Encounters:  12/22/21 130 lb (59 kg)  12/16/21 130 lb (59 kg)  11/16/21 129 lb (58.5 kg)   BMI Readings from Last 3 Encounters:  12/31/21 23.78 kg/m  12/22/21 25.39 kg/m  12/16/21 25.39 kg/m    Allergies  Allergen Reactions   Amoxicillin Anaphylaxis   Naproxen Hives, Shortness Of Breath, Nausea And Vomiting and Swelling    Penicillins Shortness Of Breath, Swelling and Rash    Has patient had a PCN reaction causing immediate rash, facial/tongue/throat swelling, SOB or lightheadedness with hypotension: Yes Has patient had a PCN reaction causing severe rash involving mucus membranes or skin necrosis: Yes Has patient had a PCN reaction that required hospitalization: Yes Has patient had a PCN reaction occurring within the  last 10 years: No  If all of the above answers are "NO", then may proceed with Cephalosporin use.    Codeine Nausea And Vomiting   Levofloxacin Nausea And Vomiting   Shellfish Allergy Hives, Diarrhea, Nausea And Vomiting and Swelling    Medications Reviewed Today     Reviewed by Kris Hartmann, NP (Nurse Practitioner) on 02/18/22 at [place] List Status: <None>   Medication Order Taking? Sig Documenting Provider Last Dose Status Informant  amLODipine (NORVASC) 5 MG tablet [id number] Yes TAKE 1 TABLET BY MOUTH DAILY Ostwalt, Janna, [name] Taking Active   Ascorbic Acid (VITAMIN C) 1000 MG tablet [id number] Yes Take 1,000 mg by mouth daily. [provider] Taking Active Self           Med Note [facility] Jun 15, 2017  1:09 PM)    BAYER LOW DOSE 81 MG tablet [id number] Yes TAKE 1 TABLET BY MOUTH EVERY MORNING Gwyneth Sprout, FNP Taking Active   Calcium-Vitamin D-Vitamin K 500-100-40 MG-UNT-MCG CHEW [id number] No Chew 1 tablet by mouth 2 (two) times daily. VIACTIV, 500-100-40 (Oral Tablet Chewable)  1 tablet twice a day for 0 days  Quantity: 0.00;  Refills: 0   Ordered :13-Apr-2010  Celene Kras, MA, Anastasiya ;  Started 29-October-2008 Active Comments: DX: 733.00  Patient not taking: Reported on 12/31/2021   [provider] Not Taking Active Self           Med Note [name]   Wed Jun 15, 2017  1:09 PM)    clindamycin (CLEOCIN) 300 MG capsule [id number] No Take 1 capsule (300 mg total) by mouth 3 (three) times daily.  Patient not taking: Reported on 12/22/2021   Mardene Speak, [name] Not Taking Active   clopidogrel (PLAVIX) 75 MG tablet [id number] Yes TAKE 1 TABLET BY MOUTH DAILY Virginia Crews, MD Taking Active   Cranberry-Cholecalciferol 4200-500 MG-UNIT CAPS [id number] No Take by mouth.  Patient not taking: Reported on 12/22/2021   [provider] Not Taking Active Self  ezetimibe (ZETIA) 10 MG tablet [id number] Yes TAKE 1 TABLET BY MOUTH DAILY [name], Dionne Bucy, MD Taking Active   famotidine (PEPCID) 20 MG tablet [id number] Yes TAKE 1 TABLET BY MOUTH TWICE A DAY [name], Dionne Bucy, MD Taking Active   ferrous sulfate 325 (65 FE) MG EC tablet [id number] Yes Take 325 mg by mouth daily. [provider] Taking Active Self           Med Note [name]   Wed Jun 15, 2017  1:09 PM)    fexofenadine (ALLEGRA) 180 MG tablet [id number] Yes Take 180 mg by mouth at bedtime as needed. [provider] Taking Active Self           Med Note [name]   Wed Jun 15, 2017  1:09 PM)    fluticasone [facility]) 50 MCG/ACT nasal spray [id number] Yes USE 2 SPRAYS EACH NOSTRIL DAILY [name], Dionne Bucy, MD Taking Active   furosemide (LASIX) 20 MG tablet [id number] Yes Take 1 tablet (20 mg total) by mouth 2 (two) times daily. Mardene Speak, [name] Taking Active   losartan (COZAAR) 100 MG tablet [id number] Yes TAKE 1 TABLET BY MOUTH DAILY [name], Dionne Bucy, MD Taking Active   meclizine (ANTIVERT) 25 MG tablet [id number] Yes Take 1 tablet (25 mg total) by mouth 3 (three) times daily as needed for dizziness or nausea. Carrie Mew, MD Taking Active   Mesalamine 800 MG TBEC [id number] Yes Take  800 mg by mouth 2 (two) times daily. [provider] Taking Active Self  methimazole (TAPAZOLE) 5 MG tablet [id number] Yes Take 2.5 mg by mouth daily.  [provider] Taking Active Self  ondansetron (ZOFRAN ODT) 4 MG disintegrating tablet [id number] No Take 1 tablet (4 mg total) by mouth every 8 (eight) hours as needed for nausea or vomiting.   Patient not taking: Reported on 12/16/2021   Virginia Crews, MD Not Taking Active   Probiotic Product (PROBIOTIC DAILY PO) [id number] Yes Take 1 capsule by mouth daily. [provider] Taking Active Self  promethazine (PHENERGAN) 6.25 MG/5ML syrup [id number]  Take 5 mLs (6.25 mg total) by mouth every 8 (eight) hours as needed for up to 7 days for nausea or vomiting. Nolberto Hanlon, MD  Active   rosuvastatin (CRESTOR) 10 MG tablet [id number] No Take 1 tablet (10 mg total) by mouth daily.  Patient not taking: Reported on 12/31/2021   Virginia Crews, MD Not Taking Active Self  rosuvastatin (CRESTOR) 20 MG tablet [id number] Yes TAKE ONE-HALF TABLET BY MOUTH DAILY [name], Dionne Bucy, MD Taking Active             SDOH:  (Social Determinants of Health) assessments and interventions performed: Yes SDOH Interventions    Flowsheet Row Clinical Support from 12/22/2021 in [place] Office Visit from 08/21/2021 in [place] Office Visit from 04/28/2020 in [place]  SDOH Interventions     Food Insecurity Interventions Intervention Not Indicated -- --  Housing Interventions Intervention Not Indicated -- --  Transportation Interventions Intervention Not Indicated, Patient Resources (Friends/Family) -- --  Depression Interventions/Treatment  -- PHQ2-9 Score <4 Follow-up Not Indicated PHQ2-9 Score <4 Follow-up Not Indicated  Financial Strain Interventions Intervention Not Indicated -- --  Physical Activity Interventions Patient Refused -- --  Stress Interventions Intervention Not Indicated -- --  Social Connections Interventions Intervention Not Indicated -- --       Medication Assistance: Application for [employer]  medication assistance program. in process.  Anticipated assistance start date TBD.  See plan of care for additional detail.  Medication Access: Within the past 30 days, how often has  patient missed a dose of medication? None Is a pillbox or other method used to improve adherence? Yes  Factors that may affect medication adherence? transportation problems Are meds synced by current pharmacy? Yes  Are meds delivered by current pharmacy? Yes  Does patient experience delays in picking up medications due to transportation concerns? No   [facility] Reviewed: Is patient disadvantaged to use [facility]?: No  Current Rx insurance plan: HTA Name and location of Current pharmacy:  [place], [place] [address] [place] [address] Phone: [phone] Fax: [place], [place] - [place] [address] [place] [address] Phone: [phone] Fax: [place], [place] [address] Phone: [phone] Fax: [phone]  [facility] services reviewed with patient today?: Yes  Patient requests to transfer care to [facility]?: No  Reason patient declined to change pharmacies: Loyalty to other pharmacy/Patient preference  Compliance/Adherence/Medication fill history: Care Gaps: Shgngrix  Influenza  Covid   Star-Rating Drugs: Losartan 100 mg last filled on 06/17/2022 for a 30-Day supply with [facility] Rosuvastatin 20 mg last filled on 06/17/2022 for a 30-Day supply with [facility]  Assessment/Plan   Hypertension (BP goal <140/90) -Controlled -Current treatment: Amlodipine 5 mg daily  Furosemide 20 mg twice daily  Losartan 100 mg daily  -Medications previously tried: NA  -Current home readings: Does not monitor routinely at home. Office blood pressure controlled today. -Reports hypotensive symptoms: dizziness/vertigo especially when getting out of bed.  -Recommended to continue current medication -Increase home monitoring to once  weekly.   Hyperlipidemia: (LDL goal < 70) -Uncontrolled -History of CAD s/p PCI 1999. History of multiple TIA (last 2018)  -Current treatment: Ezetimibe 10 mg daily  Rosuvastatin 10 mg daily  -Current treatment: Aspirin 81 mg daily  Clopidogrel 75 mg daily   -Medications previously tried: Atorvastatin (myalgias)  -Patient unable to tolerate higher doses of rosuvastatin or atorvastatin. Patient states that "she has never had good cholesterol." Given very high CV risk, would recommend PCSK9 inhibitor to help target LDL goal of at least <70. Patient was amenable to trying this option.   -Recommend rechecking FLP at PCP follow-up  -Recommend stopping Zetia, starting Repatha 140 mg every 2 weeks.   Osteoporosis / (Goal Prevent Fractures) -Uncontrolled -Last DEXA Scan: 2012  -Patient likely a candidate for osteoporosis treatment -Current treatment  None -Medications previously tried: Fosamax (Drug Holiday)  -Patient previously received treatment for osteoporosis with Fosamax reports she had some issues at the time with tolerability. She was stopped 10+ years ago, likely for a drug holiday. Her bone density has not been rechecked since then.  -Patient reports approximately 1 serving of soy milk daily, 3x servings of cheese weekly.  -Recommend [phone] units of vitamin D daily. Recommend 1200 mg of calcium daily from dietary and supplemental sources. - START Calcium + D3 combination once daily  - Recheck DEXA   Ulcerative Colitis (Goal: Minimize flares and symptoms) -Controlled -Current treatment  Mesalamine 800 mg 2 pills 3 times a day  -Medications previously tried: Sulfasalazine   -Patient reports affordability concerns with mesalamine. She has not tolerated lower doses of mesalamine or sulfasalazine.  -START PAP for Mesalamine  -Continue current medications   [name], PharmD, Para March, CPP  Clinical Pharmacist Practitioner  [facility] [phone]

## 2022-07-05 NOTE — Patient Instructions (Addendum)
Visit Information It was great speaking with you today!  Please let me know if you have any questions about our visit.  Plan:  I will look into patient assistance to help with the cost of your mesalamine We will plan to recheck your Bone Density After your follow-up with Dr. Brita Romp we will plan to start you on Repatha for your cholesterol.  Print copy of patient instructions, educational materials, and care plan provided in person.  Face to Face appointment with pharmacist scheduled for:  10/04/2022 at 10:00 AM  [name], PharmD, Para March, CPP  Clinical Pharmacist Practitioner  [facility] [place], [place] Pharmacist Assistant Phone: [phone]

## 2022-07-07 ENCOUNTER — Telehealth: Payer: Self-pay

## 2022-07-07 NOTE — Progress Notes (Signed)
Care Coordination Pharmacy Assistant   Name: Misty Page  MRN: [id number] DOB: 15-Mar-1941  Reason for Encounter: Patient Assistance Application for Mesalamine ([employer])   I received a task from [name], CPP requesting that I start the patient assistance application for patient's Mesalamine.   Application started with [facility] The application was completed on my behalf as much as possible and will be mailed to the patient to complete her portion and take the application once she is done to her Gertie Fey provider to complete and fax over to [facility] for processing.  There are instructions for the patient listed on the application for her to do in order to have the form processed properly.   If patient has any questions she can contact me directly at [phone].  Medications: Outpatient Encounter Medications as of 07/07/2022  Medication Sig   acetaminophen (TYLENOL) 500 MG tablet Take 500-1,000 mg by mouth every 8 (eight) hours as needed.   amLODipine (NORVASC) 5 MG tablet TAKE 1 TABLET BY MOUTH DAILY   Ascorbic Acid (VITAMIN C) 1000 MG tablet TAKE 1 TABLET BY MOUTH EVERY MORNING   Bacillus Coagulans-Inulin (PROBIOTIC-PREBIOTIC) 1-250 BILLION-MG CAPS TAKE 1 CAPSULE BY MOUTH DAILY   BAYER LOW DOSE 81 MG tablet TAKE 1 TABLET BY MOUTH EVERY MORNING   Calcium Carb-Cholecalciferol (CALCIUM+D3) 600-20 MG-MCG TABS Take 1 tablet by mouth daily.   clopidogrel (PLAVIX) 75 MG tablet TAKE 1 TABLET BY MOUTH DAILY   ezetimibe (ZETIA) 10 MG tablet TAKE 1 TABLET BY MOUTH DAILY   famotidine (PEPCID) 20 MG tablet TAKE 1 TABLET BY MOUTH TWICE A DAY   fluticasone (FLONASE) 50 MCG/ACT nasal spray USE 2 SPRAYS EACH NOSTRIL DAILY (Patient taking differently: Place 1 spray into both nostrils at bedtime as needed.)   losartan (COZAAR) 100 MG tablet TAKE 1 TABLET BY MOUTH DAILY   meclizine (ANTIVERT) 25 MG tablet Take 1 tablet (25 mg total) by mouth 3 (three) times daily as needed for  dizziness or nausea.   Mesalamine 800 MG TBEC Take 800 mg by mouth in the morning, at noon, and at bedtime.   methimazole (TAPAZOLE) 5 MG tablet Take 5 mg by mouth. 1 Tablet on Mondays, Wednesdays and Fridays and take 1/2 tablet daily the rest of the week.   Probiotic Product (PROBIOTIC DAILY PO) Take 1 capsule by mouth daily.   rosuvastatin (CRESTOR) 20 MG tablet TAKE ONE-HALF TABLET BY MOUTH DAILY   No facility-administered encounter medications on file as of 07/07/2022.   [name], CPA/CMA Clinical Pharmacist Assistant Phone: [phone]

## 2022-07-08 NOTE — Progress Notes (Unsigned)
MRN : [id number]  Misty Page is a 82 y.o. ([birth year]) female who presents with chief complaint of check circulation.  History of Present Illness:  Patient presents today after intervention on 02/13/2020 for acute on chronic mesenteric ischemia.  The patient notes that since she has had her intervention done her abdominal pain has imporved.  She denies any nausea or vomiting.  She denies any food phobias.  She denies any fever, chills, nausea, vomiting or diarrhea.  There was also a stent placed within the SMA.     The patient is also seen for follow up evaluation of carotid stenosis. The carotid stenosis followed by ultrasound.    The patient denies amaurosis fugax. There is no recent history of TIA symptoms or focal motor deficits. There is no prior documented CVA.   The patient is taking enteric-coated aspirin 81 mg daily.   There is no history of migraine headaches. There is no history of seizures.   No recent shortening of the patient's walking distance or new symptoms consistent with claudication.  No history of rest pain symptoms. No new ulcers or wounds of the lower extremities have occurred.   She is c/o of severe vertigo   There is no history of DVT, PE or superficial thrombophlebitis. No recent episodes of angina or shortness of breath documented.    Duplex ultrasound of the mesenteric arteries shows the stent placed in the superior mesenteric artery is patent.  No hemodynamically significant stenosis noted.   Previous carotid duplex shows <40% stenosis bilaterally, no significant change compared to last study   Noninvasive studies dated 02/17/2022 show an ABI of 0.97 on the right with a TBI 0.63. Patient has biphasic tibial artery waveforms with normal toe waveforms.   No outpatient medications have been marked as taking for the 07/12/22 encounter (Appointment) with Delana Meyer, Dolores Lory, MD.    Past Medical History:  Diagnosis Date   Allergy    Anemia     Arthritis    CA of skin 10/15/2014   Calculus of kidney 10/15/2014   Seen in ER 06/16/10.    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   Coronary artery disease    Diverticulitis    Environmental and seasonal allergies    Fibrocystic breast disease (FCBD)    GERD (gastroesophageal reflux disease)    Heart murmur    High cholesterol    History of heart artery stent    Hyperlipidemia    Hypertension    Hyperthyroidism    Myocardial infarction ([place])    Osteomyelitis of left femur ([place]) 11/30/2018   Osteoporosis    Reactive depression 02/25/2019   Thyroid disease    TIA (transient ischemic attack)     Past Surgical History:  Procedure Laterality Date   ABDOMINAL HYSTERECTOMY  2007   BREAST ENHANCEMENT SURGERY  2004   BREAST IMPLANT REMOVAL     BREAST SURGERY  1984   Fibrocystic Disease   CAROTID ENDARTERECTOMY Left 06/21/2014   Dr. Delana Meyer   CAROTID ENDARTERECTOMY Right 08/02/2014   Dr. Delana Meyer   CAROTID PTA/STENT INTERVENTION N/A 06/15/2017   Procedure: CAROTID PTA/STENT INTERVENTION;  Surgeon: Katha Cabal, MD;  Location: [facility];  Service: Cardiovascular;  Laterality: N/A;   COLONOSCOPY WITH PROPOFOL N/A 07/14/2015   Procedure: COLONOSCOPY WITH PROPOFOL;  Surgeon: Hulen Luster,  MD;  Location: [facility];  Service: Gastroenterology;  Laterality: N/A;   COLONOSCOPY WITH PROPOFOL N/A 02/08/2018   Procedure: COLONOSCOPY WITH PROPOFOL;  Surgeon: Toledo, Benay Pike, MD;  Location: [facility];  Service: Gastroenterology;  Laterality: N/A;   CORONARY ANGIOPLASTY WITH STENT PLACEMENT  1999   ESOPHAGOGASTRODUODENOSCOPY (EGD) WITH PROPOFOL N/A 02/08/2018   Procedure: ESOPHAGOGASTRODUODENOSCOPY (EGD) WITH PROPOFOL;  Surgeon: Toledo, Benay Pike, MD;  Location: [facility];  Service: Gastroenterology;  Laterality: N/A;   FEMORAL ARTERY STENT  08/2003   HERNIA REPAIR  2008   LEG AMPUTATION AT HIP Left  02/28/2019   MASTECTOMY     RENAL ARTERY STENT  [id number]   TRANSUMBILICAL AUGMENTATION MAMMAPLASTY     VASCULAR SURGERY     VISCERAL ARTERY INTERVENTION N/A 02/13/2020   Procedure: VISCERAL ARTERY INTERVENTION;  Surgeon: Algernon Huxley, MD;  Location: [facility];  Service: Cardiovascular;  Laterality: N/A;    Social History Social History   Tobacco Use   Smoking status: Former    Types: Cigarettes    Quit date: 05/30/1994    Years since quitting: 28.1   Smokeless tobacco: Never  Vaping Use   Vaping Use: Never used  Substance Use Topics   Alcohol use: Not Currently    Comment: rare- 1 glass of wine EOW   Drug use: No    Family History Family History  Problem Relation Age of Onset   Hyperlipidemia Mother    Congestive Heart Failure Mother    COPD Mother    Heart disease Mother    Hypertension Mother    Osteoporosis Mother    Pancreatic cancer Father    Arthritis Sister    Alzheimer's disease Sister    Hypertension Sister    Prostate cancer Brother    Heart attack Brother    Stomach cancer Brother    Kidney failure Brother    Leukemia Brother    Emphysema Brother    Pancreatic cancer Brother    Stomach cancer Brother    Breast cancer Paternal Grandmother    Leukemia Paternal Grandfather    Heart attack Maternal Uncle    Stroke Maternal Aunt     Allergies  Allergen Reactions   Amoxicillin Anaphylaxis   Naproxen Hives, Shortness Of Breath, Nausea And Vomiting and Swelling   Penicillins Shortness Of Breath, Swelling and Rash    Has patient had a PCN reaction causing immediate rash, facial/tongue/throat swelling, SOB or lightheadedness with hypotension: Yes Has patient had a PCN reaction causing severe rash involving mucus membranes or skin necrosis: Yes Has patient had a PCN reaction that required hospitalization: Yes Has patient had a PCN reaction occurring within the last 10 years: No  If all of the above answers are "NO", then may proceed with  Cephalosporin use.    Codeine Nausea And Vomiting   Levofloxacin Nausea And Vomiting   Shellfish Allergy Hives, Diarrhea, Nausea And Vomiting and Swelling     REVIEW OF SYSTEMS (Negative unless checked)  Constitutional: []$ Weight loss  []$ Fever  []$ Chills Cardiac: []$ Chest pain   []$ Chest pressure   []$ Palpitations   []$ Shortness of breath when laying flat   []$ Shortness of breath with exertion. Vascular:  [x]$ Pain in legs with walking   []$ Pain in legs at rest  []$ History of DVT   []$ Phlebitis   []$ Swelling in legs   []$ Varicose veins   []$ Non-healing ulcers Pulmonary:   []$ Uses home oxygen   []$ Productive cough   []$ Hemoptysis   []$ Wheeze  []$ COPD   []$ Asthma  Neurologic:  []$ Dizziness   []$ Seizures   []$ History of stroke   []$ History of TIA  []$ Aphasia   []$ Vissual changes   []$ Weakness or numbness in arm   []$ Weakness or numbness in leg Musculoskeletal:   []$ Joint swelling   []$ Joint pain   []$ Low back pain Hematologic:  []$ Easy bruising  []$ Easy bleeding   []$ Hypercoagulable state   []$ Anemic Gastrointestinal:  []$ Diarrhea   []$ Vomiting  []$ Gastroesophageal reflux/heartburn   []$ Difficulty swallowing. Genitourinary:  []$ Chronic kidney disease   []$ Difficult urination  []$ Frequent urination   []$ Blood in urine Skin:  []$ Rashes   []$ Ulcers  Psychological:  []$ History of anxiety   []$  History of major depression.  Physical Examination  There were no vitals filed for this visit. There is no height or weight on file to calculate BMI. Gen: WD/WN, NAD Head: [place]/AT, No temporalis wasting.  Ear/Nose/Throat: Hearing grossly intact, nares w/o erythema or drainage Eyes: PER, EOMI, sclera nonicteric.  Neck: Supple, no masses.  No bruit or JVD.  Pulmonary:  Good air movement, no audible wheezing, no use of accessory muscles.  Cardiac: RRR, normal S1, S2, no Murmurs. Vascular:  mild trophic changes, no open wounds; mild venous changes with 1-2+ edema Vessel Right Left  Radial Palpable Palpable  PT Not Palpable Not Palpable  DP Not  Palpable Not Palpable  Gastrointestinal: soft, non-distended. No guarding/no peritoneal signs.  Musculoskeletal: M/S 5/5 throughout.  No visible deformity.  Neurologic: CN 2-12 intact. Pain and light touch intact in extremities.  Symmetrical.  Speech is fluent. Motor exam as listed above. Psychiatric: Judgment intact, Mood & affect appropriate for pt's clinical situation. Dermatologic: No rashes or ulcers noted.  No changes consistent with cellulitis.   CBC Lab Results  Component Value Date   WBC 7.5 12/16/2021   HGB 13.2 12/16/2021   HCT 40.2 12/16/2021   MCV 94 12/16/2021   PLT 350 12/16/2021    BMET    Component Value Date/Time   NA 137 04/12/2021 1517   NA 137 07/22/2020 0000   NA 140 06/22/2014 0417   K 3.8 04/12/2021 1517   K 3.5 06/22/2014 0417   CL 101 04/12/2021 1517   CL 105 06/22/2014 0417   CO2 25 04/12/2021 1517   CO2 27 06/22/2014 0417   GLUCOSE 133 (H) 04/12/2021 1517   GLUCOSE 92 06/22/2014 0417   BUN 18 04/12/2021 1517   BUN 16 07/22/2020 0000   BUN 9 06/22/2014 0417   CREATININE 0.49 04/14/2021 0943   CREATININE 0.65 06/22/2014 0417   CALCIUM 9.2 04/12/2021 1517   CALCIUM 7.7 (L) 06/22/2014 0417   GFRNONAA >60 04/14/2021 0943   GFRNONAA >60 06/22/2014 0417   GFRNONAA >60 02/26/2013 1510   GFRAA 107 04/28/2020 1143   GFRAA >60 06/22/2014 0417   GFRAA >60 02/26/2013 1510   CrCl cannot be calculated (Patient's most recent lab result is older than the maximum 21 days allowed.).  COAG Lab Results  Component Value Date   INR 0.8 02/12/2020   INR 0.9 05/17/2019   INR 1.0 03/23/2019    Radiology No results found.   Assessment/Plan 1. Chronic mesenteric ischemia (HCC) Recommend:   The patient is status post successful angiogram with intervention of the mesenteric vessels.     The patient reports that the abdominal pain is improved and the post prandial symptoms are essentially gone.   The patient denies lifestyle limiting changes at this  point in time.   No further invasive studies, angiography or surgery at this time The patient  should continue walking and begin a more formal exercise program.  The patient should continue antiplatelet therapy and aggressive treatment of the lipid abnormalities   Patient should undergo noninvasive studies as ordered. The patient will follow up with me after the studies - VAS [place] MESENTERIC; Future  2. Atherosclerosis of native artery of both lower extremities with intermittent claudication (HCC) Recommend:   The patient has evidence of atherosclerosis of the right lower extremities.  The patient does not voice lifestyle limiting changes at this point in time.   No invasive studies, angiography or surgery at this time The patient should continue walking and begin a more formal exercise program.  The patient should continue antiplatelet therapy and aggressive treatment of the lipid abnormalities   No changes in the patient's medications at this time   Continued surveillance is indicated as atherosclerosis is likely to progress with time.     The patient will continue follow up with noninvasive studies as ordered.   - VAS [place] ABI WITH/WO TBI; Future  3. Bilateral carotid artery stenosis Recommend:   Given the patient's asymptomatic subcritical stenosis no further invasive testing or surgery at this time.   Duplex ultrasound shows 1-39% stenosis bilaterally.   Continue antiplatelet therapy as prescribed Continue management of CAD, HTN and Hyperlipidemia Healthy heart diet,  encouraged exercise at least 4 times per week Follow up in 24 months with duplex ultrasound and physical exam    4. Lymphedema Recommend:  No surgery or intervention at this point in time.    I have reviewed my discussion with the patient regarding lymphedema and why it  causes symptoms.  Patient will continue wearing graduated compression on a daily basis. The patient should put the compression on first thing  in the morning and removing them in the evening. The patient should not sleep in the compression.   In addition, behavioral modification throughout the day will be continued.  This will include frequent elevation (such as in a recliner), use of over the counter pain medications as needed and exercise such as walking.  The systemic causes for chronic edema such as liver, kidney and cardiac etiologies does not appear to have significant changed over the past year.    The patient will continue aggressive use of the  lymph pump.  This will continue to improve the edema control and prevent sequela such as ulcers and infections.   The patient will follow-up with me on an annual basis.   5. Essential (primary) hypertension Continue antihypertensive medications as already ordered, these medications have been reviewed and there are no changes at this time.  6. Hypercholesteremia Continue statin as ordered and reviewed, no changes at this time    Hortencia Pilar, MD  07/08/2022 9:27 AM

## 2022-07-12 ENCOUNTER — Encounter (INDEPENDENT_AMBULATORY_CARE_PROVIDER_SITE_OTHER): Payer: Self-pay | Admitting: Vascular Surgery

## 2022-07-12 ENCOUNTER — Ambulatory Visit (INDEPENDENT_AMBULATORY_CARE_PROVIDER_SITE_OTHER): Payer: Medicare HMO

## 2022-07-12 ENCOUNTER — Ambulatory Visit (INDEPENDENT_AMBULATORY_CARE_PROVIDER_SITE_OTHER): Payer: Medicare HMO | Admitting: Vascular Surgery

## 2022-07-12 VITALS — BP 132/69 | HR 86 | Ht 62.0 in | Wt 130.0 lb

## 2022-07-12 DIAGNOSIS — E7800 Pure hypercholesterolemia, unspecified: Secondary | ICD-10-CM | POA: Diagnosis not present

## 2022-07-12 DIAGNOSIS — I70213 Atherosclerosis of native arteries of extremities with intermittent claudication, bilateral legs: Secondary | ICD-10-CM | POA: Diagnosis not present

## 2022-07-12 DIAGNOSIS — K551 Chronic vascular disorders of intestine: Secondary | ICD-10-CM

## 2022-07-12 DIAGNOSIS — I890 Lymphedema, not elsewhere classified: Secondary | ICD-10-CM | POA: Diagnosis not present

## 2022-07-12 DIAGNOSIS — I10 Essential (primary) hypertension: Secondary | ICD-10-CM

## 2022-07-12 DIAGNOSIS — I6523 Occlusion and stenosis of bilateral carotid arteries: Secondary | ICD-10-CM | POA: Diagnosis not present

## 2022-07-15 ENCOUNTER — Encounter (INDEPENDENT_AMBULATORY_CARE_PROVIDER_SITE_OTHER): Payer: Self-pay | Admitting: Vascular Surgery

## 2022-07-15 ENCOUNTER — Other Ambulatory Visit: Payer: Self-pay

## 2022-07-15 DIAGNOSIS — M810 Age-related osteoporosis without current pathological fracture: Secondary | ICD-10-CM

## 2022-07-15 DIAGNOSIS — I70219 Atherosclerosis of native arteries of extremities with intermittent claudication, unspecified extremity: Secondary | ICD-10-CM | POA: Insufficient documentation

## 2022-07-22 NOTE — Progress Notes (Signed)
I,Sulibeya S Dimas,acting as a Education administrator for Lavon Paganini, MD.,have documented all relevant documentation on the behalf of Lavon Paganini, MD,as directed by  Lavon Paganini, MD while in the presence of Lavon Paganini, MD.   Complete physical exam   Patient: Misty Page   DOB: [birth year]   81 y.o. Female  MRN: [id number] Visit Date: 07/23/2022  Today's healthcare provider: Lavon Paganini, MD   Chief Complaint  Patient presents with   Annual Exam   Subjective    CAYSON YAZELL is a 82 y.o. female who presents today for a complete physical exam.  She reports consuming a general diet. The patient does not participate in regular exercise at present. She generally feels well. She reports sleeping fairly well. She does not have additional problems to discuss today.  HPI  12/22/21 AWV Influenza vaccine-  Does not want to do DEXA as she will not take fosamax (took before many years ago).    Past Medical History:  Diagnosis Date   Allergy    Anemia    Arthritis    CA of skin 10/15/2014   Calculus of kidney 10/15/2014   Seen in ER 06/16/10.    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   Coronary artery disease    Diverticulitis    Environmental and seasonal allergies    Fibrocystic breast disease (FCBD)    GERD (gastroesophageal reflux disease)    Heart murmur    High cholesterol    History of heart artery stent    Hyperlipidemia    Hypertension    Hyperthyroidism    Myocardial infarction ([place])    Osteomyelitis of left femur ([place]) 11/30/2018   Osteoporosis    Reactive depression 02/25/2019   Thyroid disease    TIA (transient ischemic attack)    Past Surgical History:  Procedure Laterality Date   ABDOMINAL HYSTERECTOMY  2007   BREAST ENHANCEMENT SURGERY  2004   BREAST IMPLANT REMOVAL     BREAST SURGERY  1984   Fibrocystic Disease   CAROTID ENDARTERECTOMY Left 06/21/2014   Dr. Delana Meyer   CAROTID  ENDARTERECTOMY Right 08/02/2014   Dr. Delana Meyer   CAROTID PTA/STENT INTERVENTION N/A 06/15/2017   Procedure: CAROTID PTA/STENT INTERVENTION;  Surgeon: Katha Cabal, MD;  Location: [place] [facility];  Service: Cardiovascular;  Laterality: N/A;   COLONOSCOPY WITH PROPOFOL N/A 07/14/2015   Procedure: COLONOSCOPY WITH PROPOFOL;  Surgeon: Hulen Luster, MD;  Location: [facility] ENDOSCOPY;  Service: Gastroenterology;  Laterality: N/A;   COLONOSCOPY WITH PROPOFOL N/A 02/08/2018   Procedure: COLONOSCOPY WITH PROPOFOL;  Surgeon: Toledo, Benay Pike, MD;  Location: [facility] ENDOSCOPY;  Service: Gastroenterology;  Laterality: N/A;   CORONARY ANGIOPLASTY WITH STENT PLACEMENT  1999   ESOPHAGOGASTRODUODENOSCOPY (EGD) WITH PROPOFOL N/A 02/08/2018   Procedure: ESOPHAGOGASTRODUODENOSCOPY (EGD) WITH PROPOFOL;  Surgeon: Toledo, Benay Pike, MD;  Location: [facility] ENDOSCOPY;  Service: Gastroenterology;  Laterality: N/A;   FEMORAL ARTERY STENT  08/2003   HERNIA REPAIR  2008   LEG AMPUTATION AT HIP Left 02/28/2019   MASTECTOMY     RENAL ARTERY STENT  [id number]   TRANSUMBILICAL AUGMENTATION MAMMAPLASTY     VASCULAR SURGERY     VISCERAL ARTERY INTERVENTION N/A 02/13/2020   Procedure: VISCERAL ARTERY INTERVENTION;  Surgeon: Algernon Huxley, MD;  Location: [facility] [facility];  Service: Cardiovascular;  Laterality: N/A;   Social History   Socioeconomic History   Marital status: Widowed  Spouse name: Konrad Dolores   Number of children: 1   Years of education: Not on file   Highest education level: 12th grade  Occupational History   Occupation: Emergency planning/management officer    Comment: retired  Tobacco Use   Smoking status: Former    Types: Cigarettes    Quit date: 05/30/1994    Years since quitting: 28.1   Smokeless tobacco: Never  Vaping Use   Vaping Use: Never used  Substance and Sexual Activity   Alcohol use: Not Currently    Comment: rare- 1 glass of wine EOW   Drug use: No   Sexual activity: Not Currently    Birth control/protection:  None  Other Topics Concern   Not on file  Social History Narrative   Lives alone; gets assistance 3x/week-hired help   Social Determinants of Health   Financial Resource Strain: Medium Risk (07/05/2022)   Overall Financial Resource Strain (CARDIA)    Difficulty of Paying Living Expenses: Somewhat hard  Food Insecurity: No Food Insecurity (12/22/2021)   Hunger Vital Sign    Worried About Running Out of Food in the Last Year: Never true    [place] in the Last Year: Never true  Transportation Needs: No Transportation Needs (07/05/2022)   PRAPARE - Hydrologist (Medical): No    Lack of Transportation (Non-Medical): No  Physical Activity: Inactive (12/22/2021)   Exercise Vital Sign    Days of Exercise per Week: 0 days    Minutes of Exercise per Session: 0 min  Stress: No Stress Concern Present (12/22/2021)   [place]    Feeling of Stress : Only a little  Social Connections: Socially Isolated (12/22/2021)   Social Connection and Isolation Panel [NHANES]    Frequency of Communication with Friends and Family: Three times a week    Frequency of Social Gatherings with Friends and Family: Once a week    Attends Religious Services: Never    Marine scientist or Organizations: No    Attends Archivist Meetings: Never    Marital Status: Widowed  Intimate Partner Violence: Not At Risk (12/22/2021)   Humiliation, Afraid, Rape, and Kick questionnaire    Fear of Current or Ex-Partner: No    Emotionally Abused: No    Physically Abused: No    Sexually Abused: No   Family Status  Relation Name Status   Mother  Deceased   Father  Deceased   Sister  Deceased   Brother  Deceased   Brother  Deceased   Brother  Deceased   Brother  Deceased   PGM  (Not Specified)   PGF  (Not Specified)   Mat Uncle  (Not Specified)   Mat Aunt  (Not Specified)   Family History  Problem Relation Age  of Onset   Hyperlipidemia Mother    Congestive Heart Failure Mother    COPD Mother    Heart disease Mother    Hypertension Mother    Osteoporosis Mother    Pancreatic cancer Father    Arthritis Sister    Alzheimer's disease Sister    Hypertension Sister    Prostate cancer Brother    Heart attack Brother    Stomach cancer Brother    Kidney failure Brother    Leukemia Brother    Emphysema Brother    Pancreatic cancer Brother    Stomach cancer Brother    Breast cancer Paternal Grandmother  Leukemia Paternal Grandfather    Heart attack Maternal Uncle    Stroke Maternal Aunt    Allergies  Allergen Reactions   Amoxicillin Anaphylaxis   Naproxen Hives, Shortness Of Breath, Nausea And Vomiting and Swelling   Penicillins Shortness Of Breath, Swelling and Rash    Has patient had a PCN reaction causing immediate rash, facial/tongue/throat swelling, SOB or lightheadedness with hypotension: Yes Has patient had a PCN reaction causing severe rash involving mucus membranes or skin necrosis: Yes Has patient had a PCN reaction that required hospitalization: Yes Has patient had a PCN reaction occurring within the last 10 years: No  If all of the above answers are "NO", then may proceed with Cephalosporin use.    Codeine Nausea And Vomiting   Levofloxacin Nausea And Vomiting   Shellfish Allergy Hives, Diarrhea, Nausea And Vomiting and Swelling    Patient Care Team: Virginia Crews, MD as PCP - General (Family Medicine) Delana Meyer, Dolores Lory, MD as Consulting Physician (Vascular Surgery) Corey Skains, MD as Consulting Physician (Cardiology) Lonia Farber, MD as Consulting Physician (Internal Medicine) Poggi, Marshall Cork, MD as Consulting Physician (Surgery) Adron Bene as Physician Assistant (Gastroenterology) Rubye Beach as Physician Assistant (Family Medicine) Reva Bores, MD as Referring Physician (Hematology and Oncology) Magda Bernheim, MD as  Referring Physician (Orthopedic Surgery) Dasher, Rayvon Char, MD (Dermatology)   Medications: Outpatient Medications Prior to Visit  Medication Sig   acetaminophen (TYLENOL) 500 MG tablet Take 500-1,000 mg by mouth every 8 (eight) hours as needed.   Ascorbic Acid (VITAMIN C) 1000 MG tablet TAKE 1 TABLET BY MOUTH EVERY MORNING   Bacillus Coagulans-Inulin (PROBIOTIC-PREBIOTIC) 1-250 BILLION-MG CAPS TAKE 1 CAPSULE BY MOUTH DAILY   BAYER LOW DOSE 81 MG tablet TAKE 1 TABLET BY MOUTH EVERY MORNING   Calcium Carb-Cholecalciferol (CALCIUM+D3) 600-20 MG-MCG TABS Take 1 tablet by mouth daily.   clopidogrel (PLAVIX) 75 MG tablet TAKE 1 TABLET BY MOUTH DAILY   fluticasone (FLONASE) 50 MCG/ACT nasal spray USE 2 SPRAYS EACH NOSTRIL DAILY (Patient taking differently: Place 1 spray into both nostrils at bedtime as needed.)   losartan (COZAAR) 100 MG tablet TAKE 1 TABLET BY MOUTH DAILY   meclizine (ANTIVERT) 25 MG tablet Take 1 tablet (25 mg total) by mouth 3 (three) times daily as needed for dizziness or nausea.   Mesalamine 800 MG TBEC Take 800 mg by mouth in the morning, at noon, and at bedtime.   methimazole (TAPAZOLE) 5 MG tablet Take 5 mg by mouth. 1 Tablet on Mondays, Wednesdays and Fridays and take 1/2 tablet daily the rest of the week.   Probiotic Product (PROBIOTIC DAILY PO) Take 1 capsule by mouth daily.   rosuvastatin (CRESTOR) 20 MG tablet TAKE ONE-HALF TABLET BY MOUTH DAILY   [DISCONTINUED] amLODipine (NORVASC) 5 MG tablet TAKE 1 TABLET BY MOUTH DAILY   [DISCONTINUED] ezetimibe (ZETIA) 10 MG tablet TAKE 1 TABLET BY MOUTH DAILY   [DISCONTINUED] famotidine (PEPCID) 20 MG tablet TAKE 1 TABLET BY MOUTH TWICE A DAY   No facility-administered medications prior to visit.    Review of Systems  HENT:  Positive for postnasal drip, sinus pressure and sneezing.   All other systems reviewed and are negative.   Last CBC Lab Results  Component Value Date   WBC 7.5 12/16/2021   HGB 13.2 12/16/2021   HCT  40.2 12/16/2021   MCV 94 12/16/2021   MCH 31.0 12/16/2021   RDW 13.0 12/16/2021   PLT 350 12/16/2021  Last metabolic panel Lab Results  Component Value Date   GLUCOSE 133 (H) 04/12/2021   NA 137 04/12/2021   K 3.8 04/12/2021   CL 101 04/12/2021   CO2 25 04/12/2021   BUN 18 04/12/2021   CREATININE 0.49 04/14/2021   GFRNONAA >60 04/14/2021   CALCIUM 9.2 04/12/2021   PROT 7.3 04/12/2021   ALBUMIN 4.2 04/12/2021   LABGLOB 2.0 04/28/2020   AGRATIO 2.1 04/28/2020   BILITOT 0.8 04/12/2021   ALKPHOS 64 04/12/2021   AST 22 04/12/2021   ALT 18 04/12/2021   ANIONGAP 11 04/12/2021   Last lipids Lab Results  Component Value Date   CHOL 193 05/08/2021   HDL 65 05/08/2021   LDLCALC 114 (H) 05/08/2021   TRIG 76 05/08/2021   CHOLHDL 3.0 05/08/2021   Last thyroid functions Lab Results  Component Value Date   TSH 0.22 (A) 08/06/2020   T4TOTAL 7.3 12/22/2016   Last vitamin B12 and Folate Lab Results  Component Value Date   VITAMINB12 487 11/21/2019   FOLATE 18.1 11/21/2019      Objective    BP 134/68 (BP Location: Right Arm, Patient Position: Sitting, Cuff Size: Normal)   Pulse 80   Temp 97.8 F (36.6 C) (Temporal)   Resp 16   Wt 134 lb (60.8 kg)   BMI 24.51 kg/m  BP Readings from Last 3 Encounters:  07/23/22 134/68  07/12/22 132/69  07/05/22 124/76   Wt Readings from Last 3 Encounters:  07/23/22 134 lb (60.8 kg)  07/12/22 130 lb (59 kg)  12/22/21 130 lb (59 kg)      Physical Exam Vitals reviewed.  Constitutional:      General: She is not in acute distress.    Appearance: Normal appearance. She is well-developed. She is not diaphoretic.  HENT:     Head: Normocephalic and atraumatic.     Right Ear: Tympanic membrane, ear canal and external ear normal.     Left Ear: Tympanic membrane, ear canal and external ear normal.     Nose: Nose normal.     Mouth/Throat:     Mouth: Mucous membranes are moist.     Pharynx: Oropharynx is clear. No oropharyngeal  exudate.  Eyes:     General: No scleral icterus.    Conjunctiva/sclera: Conjunctivae normal.     Pupils: Pupils are equal, round, and reactive to light.  Neck:     Thyroid: No thyromegaly.  Cardiovascular:     Rate and Rhythm: Normal rate and regular rhythm.     Heart sounds: Murmur heard.  Pulmonary:     Effort: Pulmonary effort is normal. No respiratory distress.     Breath sounds: Normal breath sounds. No wheezing or rales.  Abdominal:     General: There is no distension.     Palpations: Abdomen is soft.     Tenderness: There is no abdominal tenderness.  Musculoskeletal:        General: No deformity.     Cervical back: Neck supple.     Right lower leg: No edema.     Comments: LLE surgically absent  Lymphadenopathy:     Cervical: No cervical adenopathy.  Skin:    General: Skin is warm and dry.     Findings: No rash.  Neurological:     Mental Status: She is alert and oriented to person, place, and time. Mental status is at baseline.     Sensory: No sensory deficit.     Motor: No weakness.  Gait: Gait normal.  Psychiatric:        Mood and Affect: Mood normal.        Behavior: Behavior normal.        Thought Content: Thought content normal.      Last depression screening scores    12/22/2021    9:08 AM 08/21/2021   10:02 AM 05/04/2021   10:57 AM  PHQ 2/9 Scores  PHQ - 2 Score '2 2 1  '$ PHQ- 9 Score '4 4 3   '$ Last fall risk screening    07/23/2022    9:46 AM  [place] in the past year? 0  Number falls in past yr: 0  Injury with Fall? 0   Last Audit-C alcohol use screening    12/22/2021    9:08 AM  Alcohol Use Disorder Test (AUDIT)  1. How often do you have a drink containing alcohol? 0  2. How many drinks containing alcohol do you have on a typical day when you are drinking? 0  3. How often do you have six or more drinks on one occasion? 0  AUDIT-C Score 0   A score of 3 or more in women, and 4 or more in men indicates increased risk for alcohol  abuse, EXCEPT if all of the points are from question 1   No results found for any visits on 07/23/22.  Assessment & Plan    Routine Health Maintenance and Physical Exam  Exercise Activities and Dietary recommendations  Goals      DIET - EAT MORE FRUITS AND VEGETABLES     DIET - INCREASE WATER INTAKE     Recommend increasing water intake to 4 glasses of water a day.      LIFESTYLE - DECREASE FALLS RISK     Recommend to remove any items from the home that may cause slips or trips.        Immunization History  Administered Date(s) Administered   Fluad Quad(high Dose 65+) 02/14/2020, 04/17/2021   Influenza Split 04/19/2011   Influenza, High Dose Seasonal PF 02/18/2014, 03/12/2016, 03/07/2019   Influenza,inj,Quad PF,6+ Mos 02/10/2013, 03/07/2017, 03/24/2018   Influenza-Unspecified 02/10/2013, 03/01/2015, 03/31/2022   PFIZER Comirnaty(Gray Top)Covid-19 Tri-Sucrose Vaccine 06/25/2019, 07/16/2019, 04/04/2020   Pfizer Covid-19 Vaccine Bivalent Booster 28yr & up 11/10/2020, 04/30/2021   Pneumococcal Conjugate-13 05/18/2013   Pneumococcal Polysaccharide-23 03/02/2004, 08/15/2017   Td 02/08/2005, 11/04/2017   Zoster, Live 01/29/2006    Health Maintenance  Topic Date Due   Zoster Vaccines- Shingrix (1 of 2) Never done   COVID-19 Vaccine (6 - 2023-24 season) 01/29/2022   Medicare Annual Wellness (AWV)  12/23/2022   DTaP/Tdap/Td (3 - Tdap) 11/05/2027   Pneumonia Vaccine 82 Years old  Completed   INFLUENZA VACCINE  Completed   DEXA SCAN  Completed   HPV VACCINES  Aged Out    Discussed health benefits of physical activity, and encouraged her to engage in regular exercise appropriate for her age and condition.  Problem List Items Addressed This Visit       Cardiovascular and Mediastinum   Essential (primary) hypertension    Well controlled Continue current medications Recheck metabolic panel F/u in 6 months       Relevant Medications   amLODipine (NORVASC) 5 MG tablet    ezetimibe (ZETIA) 10 MG tablet   Other Relevant Orders   Comprehensive metabolic panel   Atherosclerosis of native coronary artery of native heart with other form of angina pectoris ([place]  Continue BP and cholesterol controlled F/b VVS      Relevant Medications   amLODipine (NORVASC) 5 MG tablet   ezetimibe (ZETIA) 10 MG tablet     Digestive   Ulcerative colitis ([place])    F/b GI Recheck CBC      Relevant Orders   CBC     Other   Hypercholesteremia    Previously well controlled Continue statin and zetia Repeat FLP and CMP      Relevant Medications   amLODipine (NORVASC) 5 MG tablet   ezetimibe (ZETIA) 10 MG tablet   Other Relevant Orders   Lipid Panel With LDL/HDL Ratio   Status post above-knee amputation of left lower extremity ([place])    S/p L hip disarticulation stbale      Other Visit Diagnoses     Encounter for annual physical exam    -  Primary   Relevant Orders   Comprehensive metabolic panel   Lipid Panel With LDL/HDL Ratio   CBC   Long-term use of high-risk medication       Relevant Orders   CBC        Return in about 6 months (around 01/21/2023) for chronic disease f/u.     I, Lavon Paganini, MD, have reviewed all documentation for this visit. The documentation on 07/23/22 for the exam, diagnosis, procedures, and orders are all accurate and complete.   Kaizlee Carlino, Dionne Bucy, MD, MPH [facility]

## 2022-07-23 ENCOUNTER — Encounter: Payer: Self-pay | Admitting: Family Medicine

## 2022-07-23 ENCOUNTER — Ambulatory Visit (INDEPENDENT_AMBULATORY_CARE_PROVIDER_SITE_OTHER): Payer: Medicare HMO | Admitting: Family Medicine

## 2022-07-23 VITALS — BP 134/68 | HR 80 | Temp 97.8°F | Resp 16 | Wt 134.0 lb

## 2022-07-23 DIAGNOSIS — I10 Essential (primary) hypertension: Secondary | ICD-10-CM | POA: Diagnosis not present

## 2022-07-23 DIAGNOSIS — Z79899 Other long term (current) drug therapy: Secondary | ICD-10-CM | POA: Diagnosis not present

## 2022-07-23 DIAGNOSIS — Z0000 Encounter for general adult medical examination without abnormal findings: Secondary | ICD-10-CM | POA: Diagnosis not present

## 2022-07-23 DIAGNOSIS — I25118 Atherosclerotic heart disease of native coronary artery with other forms of angina pectoris: Secondary | ICD-10-CM | POA: Diagnosis not present

## 2022-07-23 DIAGNOSIS — E7800 Pure hypercholesterolemia, unspecified: Secondary | ICD-10-CM | POA: Diagnosis not present

## 2022-07-23 DIAGNOSIS — Z89612 Acquired absence of left leg above knee: Secondary | ICD-10-CM

## 2022-07-23 DIAGNOSIS — D649 Anemia, unspecified: Secondary | ICD-10-CM | POA: Diagnosis not present

## 2022-07-23 DIAGNOSIS — K51911 Ulcerative colitis, unspecified with rectal bleeding: Secondary | ICD-10-CM

## 2022-07-23 DIAGNOSIS — E649 Sequelae of unspecified nutritional deficiency: Secondary | ICD-10-CM | POA: Diagnosis not present

## 2022-07-23 MED ORDER — AMLODIPINE BESYLATE 5 MG PO TABS: 5.0000 mg | ORAL_TABLET | Freq: Every day | ORAL | 3 refills | Status: DC

## 2022-07-23 MED ORDER — FAMOTIDINE 20 MG PO TABS
20.0000 mg | ORAL_TABLET | Freq: Two times a day (BID) | ORAL | 3 refills | Status: DC
Start: 2022-07-23 — End: 2023-07-08

## 2022-07-23 MED ORDER — EZETIMIBE 10 MG PO TABS: 10.0000 mg | ORAL_TABLET | Freq: Every day | ORAL | 3 refills | Status: DC

## 2022-07-23 NOTE — Assessment & Plan Note (Signed)
Well controlled Continue current medications Recheck metabolic panel F/u in 6 months  

## 2022-07-23 NOTE — Assessment & Plan Note (Signed)
F/b GI Recheck CBC

## 2022-07-23 NOTE — Assessment & Plan Note (Signed)
S/p L hip disarticulation stbale

## 2022-07-23 NOTE — Assessment & Plan Note (Signed)
Previously well controlled Continue statin and zetia Repeat FLP and CMP

## 2022-07-23 NOTE — Assessment & Plan Note (Signed)
Continue BP and cholesterol controlled F/b VVS

## 2022-07-24 LAB — LIPID PANEL WITH LDL/HDL RATIO
Cholesterol, Total: 184 mg/dL (ref 100–199)
HDL: 70 mg/dL (ref >39)
LDL Chol Calc (NIH): 101 mg/dL — ABNORMAL HIGH (ref 0–99)
LDL/HDL Ratio: 1.4 ratio (ref 0.0–3.2)
Triglycerides: 71 mg/dL (ref 0–149)
VLDL Cholesterol Cal: 13 mg/dL (ref 5–40)

## 2022-07-24 LAB — COMPREHENSIVE METABOLIC PANEL
ALT: 9 IU/L (ref 0–32)
AST: 12 IU/L (ref 0–40)
Albumin/Globulin Ratio: 2.6 — ABNORMAL HIGH (ref 1.2–2.2)
Albumin: 4.5 g/dL (ref 3.7–4.7)
Alkaline Phosphatase: 76 IU/L (ref 44–121)
BUN/Creatinine Ratio: 26 (ref 12–28)
BUN: 16 mg/dL (ref 8–27)
Bilirubin Total: 0.2 mg/dL (ref 0.0–1.2)
CO2: 22 mmol/L (ref 20–29)
Calcium: 8.9 mg/dL (ref 8.7–10.3)
Chloride: 105 mmol/L (ref 96–106)
Creatinine, Ser: 0.61 mg/dL (ref 0.57–1.00)
Globulin, Total: 1.7 g/dL (ref 1.5–4.5)
Glucose: 92 mg/dL (ref 70–99)
Potassium: 4.4 mmol/L (ref 3.5–5.2)
Sodium: 144 mmol/L (ref 134–144)
Total Protein: 6.2 g/dL (ref 6.0–8.5)
eGFR: 90 mL/min/{1.73_m2} (ref >59)

## 2022-07-24 LAB — CBC
Hematocrit: 26.9 % — ABNORMAL LOW (ref 34.0–46.6)
Hemoglobin: 7.6 g/dL — ABNORMAL LOW (ref 11.1–15.9)
MCH: 23 pg — ABNORMAL LOW (ref 26.6–33.0)
MCHC: 28.3 g/dL — ABNORMAL LOW (ref 31.5–35.7)
MCV: 82 fL (ref 79–97)
Platelets: 390 10*3/uL (ref 150–450)
RBC: 3.3 x10E6/uL — ABNORMAL LOW (ref 3.77–5.28)
RDW: 18.8 % — ABNORMAL HIGH (ref 11.7–15.4)
WBC: 5.9 10*3/uL (ref 3.4–10.8)

## 2022-07-27 ENCOUNTER — Other Ambulatory Visit: Payer: Self-pay

## 2022-07-27 MED ORDER — FERROUS SULFATE 325 (65 FE) MG PO TABS: 325.0000 mg | ORAL_TABLET | Freq: Every day | ORAL | 3 refills | Status: DC

## 2022-07-28 LAB — IRON,TIBC AND FERRITIN PANEL
Ferritin: 7 ng/mL — ABNORMAL LOW (ref 15–150)
Iron Saturation: 2 % — CL (ref 15–55)
Iron: 10 ug/dL — ABNORMAL LOW (ref 27–139)
Total Iron Binding Capacity: 479 ug/dL — ABNORMAL HIGH (ref 250–450)
UIBC: 469 ug/dL — ABNORMAL HIGH (ref 118–369)

## 2022-07-28 LAB — SPECIMEN STATUS REPORT

## 2022-07-29 ENCOUNTER — Telehealth: Payer: Self-pay

## 2022-07-29 DIAGNOSIS — Z8249 Family history of ischemic heart disease and other diseases of the circulatory system: Secondary | ICD-10-CM | POA: Diagnosis not present

## 2022-07-29 DIAGNOSIS — E0590 Thyrotoxicosis, unspecified without thyrotoxic crisis or storm: Secondary | ICD-10-CM | POA: Diagnosis not present

## 2022-07-29 DIAGNOSIS — I2510 Atherosclerotic heart disease of native coronary artery without angina pectoris: Secondary | ICD-10-CM | POA: Diagnosis not present

## 2022-07-29 DIAGNOSIS — G629 Polyneuropathy, unspecified: Secondary | ICD-10-CM | POA: Diagnosis not present

## 2022-07-29 DIAGNOSIS — Z87891 Personal history of nicotine dependence: Secondary | ICD-10-CM | POA: Diagnosis not present

## 2022-07-29 DIAGNOSIS — Z823 Family history of stroke: Secondary | ICD-10-CM | POA: Diagnosis not present

## 2022-07-29 DIAGNOSIS — K219 Gastro-esophageal reflux disease without esophagitis: Secondary | ICD-10-CM | POA: Diagnosis not present

## 2022-07-29 DIAGNOSIS — I10 Essential (primary) hypertension: Secondary | ICD-10-CM | POA: Diagnosis not present

## 2022-07-29 DIAGNOSIS — I70219 Atherosclerosis of native arteries of extremities with intermittent claudication, unspecified extremity: Secondary | ICD-10-CM | POA: Diagnosis not present

## 2022-07-29 DIAGNOSIS — Z008 Encounter for other general examination: Secondary | ICD-10-CM | POA: Diagnosis not present

## 2022-07-29 DIAGNOSIS — E785 Hyperlipidemia, unspecified: Secondary | ICD-10-CM | POA: Diagnosis not present

## 2022-07-29 DIAGNOSIS — K529 Noninfective gastroenteritis and colitis, unspecified: Secondary | ICD-10-CM | POA: Diagnosis not present

## 2022-07-29 DIAGNOSIS — Z9181 History of falling: Secondary | ICD-10-CM | POA: Diagnosis not present

## 2022-07-29 NOTE — Progress Notes (Signed)
Care Coordination Pharmacy Assistant   Name: Misty Page  MRN: [id number] DOB: [birth year]  Reason for Encounter: Lab results/New medication/Remind about Dexa scan   I received a task from Misty Page, CPP requesting that I contact the patient to inform her that:  "Let patient know that Misty Page reviewed patient's recent labwork. Her cholesterol is still elevated. Given her high risk of heart disease and stroke, I would recommend starting Repatha to help lower her cholesterol. This is an injection she administers into the skin ever 2 weeks. It would replace her Zetia. If she is amenable I can send in the Rx and I am happy to set up a time for her to come to the office for counseling on how to use the pens.   Please start [facility] for Repatha if patient amenable.   Finally, please ask patient to reach out to [facility] at [phone] to schedule her DEXA.  "  I spoke with the patient and after reviewing her labs and speaking with her about the recommendation to start Misty Page patient stated she would decline for now. Per patient her cholesterol is lower than it has ever been and she would prefer to continue the Zetia vs given herself injections at her age. Per patient she will get labs redone in 6 months and if something drastically changes at that time she will then revisit the conversation of starting the Misty Page if the Misty Page is still available at that time.  I also reminded patient to reach out to [facility] to schedule her Dexa Scan. Patient declines to have this testing done at this time. Patient stated she is aware of Osteoporosis and she is wheel chair bound. Patient stated she isn't going to take any of the medication related to the Osteoporosis so she doesn't see a need to take the test at this time. Patient stated she has taken the test before and she is aware that it is a simple test but she stated at her age she just prefers not to deal with it.   I informed patient that I would  relay all above information to CPP, and if something changes she can reach out to me at [phone]. Patient stated she is thankful for our help and she would give me a call if she needs anything.   Patient did advise the only thing she was worried about was her iron levels being low, and she stated that she feels they dropped as there were no refills on her iron pills so she missed doses and her iron drops anytime she misses her dose. I informed the patient that PCP sent refills to her pharmacy on 02/27. Patient stated that her medication is delivered in packs every two weeks so the iron pills should be in her upcoming packs. Patient will resume medication once delivered.  Medications: Outpatient Encounter Medications as of 07/29/2022  Medication Sig   acetaminophen (TYLENOL) 500 MG tablet Take 500-1,000 mg by mouth every 8 (eight) hours as needed.   amLODipine (NORVASC) 5 MG tablet Take 1 tablet (5 mg total) by mouth daily.   Ascorbic Acid (VITAMIN C) 1000 MG tablet TAKE 1 TABLET BY MOUTH EVERY MORNING   Bacillus Coagulans-Inulin (PROBIOTIC-PREBIOTIC) 1-250 BILLION-MG CAPS TAKE 1 CAPSULE BY MOUTH DAILY   BAYER LOW DOSE 81 MG tablet TAKE 1 TABLET BY MOUTH EVERY MORNING   Calcium Carb-Cholecalciferol (CALCIUM+D3) 600-20 MG-MCG TABS Take 1 tablet by mouth daily.   clopidogrel (PLAVIX) 75 MG tablet TAKE 1 TABLET BY  MOUTH DAILY   ezetimibe (ZETIA) 10 MG tablet Take 1 tablet (10 mg total) by mouth daily.   famotidine (PEPCID) 20 MG tablet Take 1 tablet (20 mg total) by mouth 2 (two) times daily.   ferrous sulfate 325 (65 FE) MG tablet Take 1 tablet (325 mg total) by mouth daily with breakfast.   fluticasone (FLONASE) 50 MCG/ACT nasal spray USE 2 SPRAYS EACH NOSTRIL DAILY (Patient taking differently: Place 1 spray into both nostrils at bedtime as needed.)   losartan (COZAAR) 100 MG tablet TAKE 1 TABLET BY MOUTH DAILY   meclizine (ANTIVERT) 25 MG tablet Take 1 tablet (25 mg total) by mouth 3 (three)  times daily as needed for dizziness or nausea.   Mesalamine 800 MG TBEC Take 800 mg by mouth in the morning, at noon, and at bedtime.   methimazole (TAPAZOLE) 5 MG tablet Take 5 mg by mouth. 1 Tablet on Mondays, Wednesdays and Fridays and take 1/2 tablet daily the rest of the week.   Probiotic Product (PROBIOTIC DAILY PO) Take 1 capsule by mouth daily.   rosuvastatin (CRESTOR) 20 MG tablet TAKE ONE-HALF TABLET BY MOUTH DAILY   No facility-administered encounter medications on file as of 07/29/2022.    [name], CPA/CMA Clinical Pharmacist Assistant Phone: [phone]

## 2022-08-23 DIAGNOSIS — D2261 Melanocytic nevi of right upper limb, including shoulder: Secondary | ICD-10-CM | POA: Diagnosis not present

## 2022-08-23 DIAGNOSIS — D2262 Melanocytic nevi of left upper limb, including shoulder: Secondary | ICD-10-CM | POA: Diagnosis not present

## 2022-08-23 DIAGNOSIS — D2272 Melanocytic nevi of left lower limb, including hip: Secondary | ICD-10-CM | POA: Diagnosis not present

## 2022-08-23 DIAGNOSIS — D2271 Melanocytic nevi of right lower limb, including hip: Secondary | ICD-10-CM | POA: Diagnosis not present

## 2022-08-23 DIAGNOSIS — L821 Other seborrheic keratosis: Secondary | ICD-10-CM | POA: Diagnosis not present

## 2022-08-23 DIAGNOSIS — Z85828 Personal history of other malignant neoplasm of skin: Secondary | ICD-10-CM | POA: Diagnosis not present

## 2022-08-23 DIAGNOSIS — Z08 Encounter for follow-up examination after completed treatment for malignant neoplasm: Secondary | ICD-10-CM | POA: Diagnosis not present

## 2022-08-23 DIAGNOSIS — D225 Melanocytic nevi of trunk: Secondary | ICD-10-CM | POA: Diagnosis not present

## 2022-09-06 ENCOUNTER — Other Ambulatory Visit: Payer: Self-pay | Admitting: Family Medicine

## 2022-09-08 ENCOUNTER — Encounter: Payer: Self-pay | Admitting: *Deleted

## 2022-09-08 ENCOUNTER — Telehealth: Payer: Self-pay | Admitting: *Deleted

## 2022-09-08 NOTE — Patient Outreach (Signed)
  Care Coordination   Initial Visit Note   09/08/2022 Name: DAHJA SPADEA MRN: [id number] DOB: 07/04/1940  Misty Page is a 82 y.o. year old female who sees [name], Marzella Schlein, MD for primary care. I spoke with  [name] by phone today.  What matters to the patients health and wellness today?  Patient lives alone, state she has private pay aide 4 hours on Mondays, Wednesdays, and Fridays.  Uses wheelchair as she has left amputation.  Denies need for mobile meals, state aide helps.  Also has neighbor that is helpful when needed.    Goals Addressed             This Visit's Progress    Effective management of chronic conditions       Care Coordination Interventions: Evaluation of current treatment plan related to HTN, previous cancer patient, CAD  and patient's adherence to plan as established by provider Reviewed medications with patient and discussed affordability and adherence Reviewed scheduled/upcoming provider appointments including ortho on 5/3, will have PCP visit in August Discussed plans with patient for ongoing care management follow up and provided patient with direct contact information for care management team Screening for signs and symptoms of depression related to chronic disease state  Assessed social determinant of health barriers         SDOH assessments and interventions completed:  Yes  SDOH Interventions Today    Flowsheet Row Most Recent Value  SDOH Interventions   Food Insecurity Interventions Intervention Not Indicated  Housing Interventions Intervention Not Indicated  Transportation Interventions Intervention Not Indicated        Care Coordination Interventions:  Yes, provided   Interventions Today    Flowsheet Row Most Recent Value  Chronic Disease   Chronic disease during today's visit Hypertension (HTN), Other  General Interventions   General Interventions Discussed/Reviewed General Interventions Reviewed, [facility],  Doctor Visits, Horticulturist, commercial (DME)  Doctor Visits Discussed/Reviewed Doctor Visits Reviewed, PCP, Specialist  Durable Medical Equipment (DME) Wheelchair, Estate manager/land agent Standard  PCP/Specialist Visits Compliance with follow-up visit       Follow up plan: Follow up call scheduled for 6/13    Encounter Outcome:  Pt. Visit Completed   Kemper Durie, RN, MSN, [facility] Care Management Care Management Coordinator [phone]

## 2022-09-19 ENCOUNTER — Inpatient Hospital Stay: Payer: Medicare HMO

## 2022-09-19 ENCOUNTER — Emergency Department: Payer: Medicare HMO

## 2022-09-19 ENCOUNTER — Encounter: Payer: Self-pay | Admitting: Internal Medicine

## 2022-09-19 ENCOUNTER — Inpatient Hospital Stay
Admission: EM | Admit: 2022-09-19 | Discharge: 2022-09-23 | DRG: 853 | Disposition: A | Discharge status: Home / Self Care | Payer: Medicare HMO | Attending: Student | Admitting: Student

## 2022-09-19 ENCOUNTER — Other Ambulatory Visit: Payer: Self-pay

## 2022-09-19 DIAGNOSIS — Z823 Family history of stroke: Secondary | ICD-10-CM

## 2022-09-19 DIAGNOSIS — R011 Cardiac murmur, unspecified: Secondary | ICD-10-CM | POA: Diagnosis not present

## 2022-09-19 DIAGNOSIS — E876 Hypokalemia: Secondary | ICD-10-CM | POA: Diagnosis not present

## 2022-09-19 DIAGNOSIS — Z66 Do not resuscitate: Secondary | ICD-10-CM | POA: Diagnosis not present

## 2022-09-19 DIAGNOSIS — Z803 Family history of malignant neoplasm of breast: Secondary | ICD-10-CM

## 2022-09-19 DIAGNOSIS — Z8673 Personal history of transient ischemic attack (TIA), and cerebral infarction without residual deficits: Secondary | ICD-10-CM | POA: Diagnosis not present

## 2022-09-19 DIAGNOSIS — R109 Unspecified abdominal pain: Secondary | ICD-10-CM | POA: Diagnosis not present

## 2022-09-19 DIAGNOSIS — Z95820 Peripheral vascular angioplasty status with implants and grafts: Secondary | ICD-10-CM

## 2022-09-19 DIAGNOSIS — Z83438 Family history of other disorder of lipoprotein metabolism and other lipidemia: Secondary | ICD-10-CM

## 2022-09-19 DIAGNOSIS — Z7902 Long term (current) use of antithrombotics/antiplatelets: Secondary | ICD-10-CM

## 2022-09-19 DIAGNOSIS — R112 Nausea with vomiting, unspecified: Secondary | ICD-10-CM | POA: Diagnosis not present

## 2022-09-19 DIAGNOSIS — E0590 Thyrotoxicosis, unspecified without thyrotoxic crisis or storm: Secondary | ICD-10-CM | POA: Diagnosis present

## 2022-09-19 DIAGNOSIS — Z87891 Personal history of nicotine dependence: Secondary | ICD-10-CM

## 2022-09-19 DIAGNOSIS — K5190 Ulcerative colitis, unspecified, without complications: Secondary | ICD-10-CM | POA: Diagnosis present

## 2022-09-19 DIAGNOSIS — R911 Solitary pulmonary nodule: Secondary | ICD-10-CM

## 2022-09-19 DIAGNOSIS — I10 Essential (primary) hypertension: Secondary | ICD-10-CM | POA: Diagnosis not present

## 2022-09-19 DIAGNOSIS — Z880 Allergy status to penicillin: Secondary | ICD-10-CM

## 2022-09-19 DIAGNOSIS — R6520 Severe sepsis without septic shock: Secondary | ICD-10-CM | POA: Diagnosis present

## 2022-09-19 DIAGNOSIS — Z800 Family history of malignant neoplasm of digestive organs: Secondary | ICD-10-CM

## 2022-09-19 DIAGNOSIS — N189 Chronic kidney disease, unspecified: Secondary | ICD-10-CM | POA: Diagnosis not present

## 2022-09-19 DIAGNOSIS — R0602 Shortness of breath: Secondary | ICD-10-CM | POA: Diagnosis not present

## 2022-09-19 DIAGNOSIS — D509 Iron deficiency anemia, unspecified: Secondary | ICD-10-CM | POA: Diagnosis not present

## 2022-09-19 DIAGNOSIS — A419 Sepsis, unspecified organism: Secondary | ICD-10-CM | POA: Diagnosis present

## 2022-09-19 DIAGNOSIS — M549 Dorsalgia, unspecified: Secondary | ICD-10-CM | POA: Diagnosis not present

## 2022-09-19 DIAGNOSIS — A411 Sepsis due to other specified staphylococcus: Secondary | ICD-10-CM | POA: Diagnosis not present

## 2022-09-19 DIAGNOSIS — R52 Pain, unspecified: Secondary | ICD-10-CM

## 2022-09-19 DIAGNOSIS — Z89612 Acquired absence of left leg above knee: Secondary | ICD-10-CM

## 2022-09-19 DIAGNOSIS — D72829 Elevated white blood cell count, unspecified: Secondary | ICD-10-CM

## 2022-09-19 DIAGNOSIS — Z886 Allergy status to analgesic agent status: Secondary | ICD-10-CM

## 2022-09-19 DIAGNOSIS — K219 Gastro-esophageal reflux disease without esophagitis: Secondary | ICD-10-CM | POA: Diagnosis not present

## 2022-09-19 DIAGNOSIS — I272 Pulmonary hypertension, unspecified: Secondary | ICD-10-CM | POA: Diagnosis not present

## 2022-09-19 DIAGNOSIS — I129 Hypertensive chronic kidney disease with stage 1 through stage 4 chronic kidney disease, or unspecified chronic kidney disease: Secondary | ICD-10-CM | POA: Diagnosis not present

## 2022-09-19 DIAGNOSIS — D638 Anemia in other chronic diseases classified elsewhere: Secondary | ICD-10-CM | POA: Diagnosis present

## 2022-09-19 DIAGNOSIS — Z881 Allergy status to other antibiotic agents status: Secondary | ICD-10-CM

## 2022-09-19 DIAGNOSIS — N201 Calculus of ureter: Secondary | ICD-10-CM | POA: Diagnosis not present

## 2022-09-19 DIAGNOSIS — E7800 Pure hypercholesterolemia, unspecified: Secondary | ICD-10-CM | POA: Diagnosis not present

## 2022-09-19 DIAGNOSIS — I959 Hypotension, unspecified: Secondary | ICD-10-CM | POA: Diagnosis not present

## 2022-09-19 DIAGNOSIS — I350 Nonrheumatic aortic (valve) stenosis: Secondary | ICD-10-CM | POA: Diagnosis present

## 2022-09-19 DIAGNOSIS — J810 Acute pulmonary edema: Secondary | ICD-10-CM | POA: Diagnosis present

## 2022-09-19 DIAGNOSIS — Z8042 Family history of malignant neoplasm of prostate: Secondary | ICD-10-CM

## 2022-09-19 DIAGNOSIS — Z87440 Personal history of urinary (tract) infections: Secondary | ICD-10-CM | POA: Diagnosis not present

## 2022-09-19 DIAGNOSIS — N209 Urinary calculus, unspecified: Secondary | ICD-10-CM | POA: Insufficient documentation

## 2022-09-19 DIAGNOSIS — Z7982 Long term (current) use of aspirin: Secondary | ICD-10-CM

## 2022-09-19 DIAGNOSIS — Z90710 Acquired absence of both cervix and uterus: Secondary | ICD-10-CM

## 2022-09-19 DIAGNOSIS — N1330 Unspecified hydronephrosis: Secondary | ICD-10-CM

## 2022-09-19 DIAGNOSIS — I6529 Occlusion and stenosis of unspecified carotid artery: Secondary | ICD-10-CM | POA: Diagnosis present

## 2022-09-19 DIAGNOSIS — D631 Anemia in chronic kidney disease: Secondary | ICD-10-CM | POA: Diagnosis not present

## 2022-09-19 DIAGNOSIS — Z955 Presence of coronary angioplasty implant and graft: Secondary | ICD-10-CM

## 2022-09-19 DIAGNOSIS — J9601 Acute respiratory failure with hypoxia: Secondary | ICD-10-CM | POA: Diagnosis not present

## 2022-09-19 DIAGNOSIS — N136 Pyonephrosis: Secondary | ICD-10-CM | POA: Diagnosis not present

## 2022-09-19 DIAGNOSIS — Z825 Family history of asthma and other chronic lower respiratory diseases: Secondary | ICD-10-CM

## 2022-09-19 DIAGNOSIS — Z8262 Family history of osteoporosis: Secondary | ICD-10-CM

## 2022-09-19 DIAGNOSIS — K5730 Diverticulosis of large intestine without perforation or abscess without bleeding: Secondary | ICD-10-CM | POA: Diagnosis not present

## 2022-09-19 DIAGNOSIS — N390 Urinary tract infection, site not specified: Secondary | ICD-10-CM | POA: Diagnosis not present

## 2022-09-19 DIAGNOSIS — Z885 Allergy status to narcotic agent status: Secondary | ICD-10-CM

## 2022-09-19 DIAGNOSIS — I252 Old myocardial infarction: Secondary | ICD-10-CM

## 2022-09-19 DIAGNOSIS — N132 Hydronephrosis with renal and ureteral calculous obstruction: Secondary | ICD-10-CM | POA: Diagnosis not present

## 2022-09-19 DIAGNOSIS — R6510 Systemic inflammatory response syndrome (SIRS) of non-infectious origin without acute organ dysfunction: Secondary | ICD-10-CM | POA: Diagnosis not present

## 2022-09-19 DIAGNOSIS — N3080 Other cystitis without hematuria: Secondary | ICD-10-CM | POA: Diagnosis not present

## 2022-09-19 DIAGNOSIS — I2510 Atherosclerotic heart disease of native coronary artery without angina pectoris: Secondary | ICD-10-CM | POA: Diagnosis not present

## 2022-09-19 DIAGNOSIS — N200 Calculus of kidney: Principal | ICD-10-CM

## 2022-09-19 DIAGNOSIS — Z820 Family history of epilepsy and other diseases of the nervous system: Secondary | ICD-10-CM

## 2022-09-19 DIAGNOSIS — Z87442 Personal history of urinary calculi: Secondary | ICD-10-CM

## 2022-09-19 DIAGNOSIS — Z79899 Other long term (current) drug therapy: Secondary | ICD-10-CM

## 2022-09-19 DIAGNOSIS — Z91013 Allergy to seafood: Secondary | ICD-10-CM

## 2022-09-19 DIAGNOSIS — Z9010 Acquired absence of unspecified breast and nipple: Secondary | ICD-10-CM

## 2022-09-19 DIAGNOSIS — Z806 Family history of leukemia: Secondary | ICD-10-CM

## 2022-09-19 DIAGNOSIS — Z841 Family history of disorders of kidney and ureter: Secondary | ICD-10-CM

## 2022-09-19 DIAGNOSIS — Z8249 Family history of ischemic heart disease and other diseases of the circulatory system: Secondary | ICD-10-CM

## 2022-09-19 DIAGNOSIS — Z85828 Personal history of other malignant neoplasm of skin: Secondary | ICD-10-CM

## 2022-09-19 DIAGNOSIS — M810 Age-related osteoporosis without current pathological fracture: Secondary | ICD-10-CM | POA: Diagnosis present

## 2022-09-19 DIAGNOSIS — Z8261 Family history of arthritis: Secondary | ICD-10-CM

## 2022-09-19 LAB — URINALYSIS, ROUTINE W REFLEX MICROSCOPIC
Bilirubin Urine: NEGATIVE
Glucose, UA: NEGATIVE mg/dL
Ketones, ur: 5 mg/dL — AB
Nitrite: NEGATIVE
Protein, ur: 30 mg/dL — AB
RBC / HPF: >50 RBC/hpf (ref 0–5)
Specific Gravity, Urine: 1.011 (ref 1.005–1.030)
pH: 7 (ref 5.0–8.0)

## 2022-09-19 LAB — HEPATIC FUNCTION PANEL
ALT: 15 U/L (ref 0–44)
AST: 26 U/L (ref 15–41)
Albumin: 4.1 g/dL (ref 3.5–5.0)
Alkaline Phosphatase: 62 U/L (ref 38–126)
Bilirubin, Direct: <0.1 mg/dL (ref 0.0–0.2)
Total Bilirubin: 0.3 mg/dL (ref 0.3–1.2)
Total Protein: 7.2 g/dL (ref 6.5–8.1)

## 2022-09-19 LAB — LACTIC ACID, PLASMA
Lactic Acid, Venous: 1.5 mmol/L (ref 0.5–1.9)
Lactic Acid, Venous: 3.5 mmol/L (ref 0.5–1.9)
Lactic Acid, Venous: 1.6 mmol/L (ref 0.5–1.9)
Lactic Acid, Venous: 1 mmol/L (ref 0.5–1.9)

## 2022-09-19 LAB — CBC
HCT: 32.9 % — ABNORMAL LOW (ref 36.0–46.0)
Hemoglobin: 9.5 g/dL — ABNORMAL LOW (ref 12.0–15.0)
MCH: 22 pg — ABNORMAL LOW (ref 26.0–34.0)
MCHC: 28.9 g/dL — ABNORMAL LOW (ref 30.0–36.0)
MCV: 76.3 fL — ABNORMAL LOW (ref 80.0–100.0)
Platelets: 441 10*3/uL — ABNORMAL HIGH (ref 150–400)
RBC: 4.31 MIL/uL (ref 3.87–5.11)
RDW: 19.5 % — ABNORMAL HIGH (ref 11.5–15.5)
WBC: 16 10*3/uL — ABNORMAL HIGH (ref 4.0–10.5)
nRBC: 0 % (ref 0.0–0.2)

## 2022-09-19 LAB — BASIC METABOLIC PANEL
Anion gap: 14 (ref 5–15)
BUN: 16 mg/dL (ref 8–23)
CO2: 18 mmol/L — ABNORMAL LOW (ref 22–32)
Calcium: 9 mg/dL (ref 8.9–10.3)
Chloride: 103 mmol/L (ref 98–111)
Creatinine, Ser: 0.56 mg/dL (ref 0.44–1.00)
GFR, Estimated: >60 mL/min (ref >60)
Glucose, Bld: 157 mg/dL — ABNORMAL HIGH (ref 70–99)
Potassium: 3.8 mmol/L (ref 3.5–5.1)
Sodium: 135 mmol/L (ref 135–145)

## 2022-09-19 LAB — LIPASE, BLOOD: Lipase: 37 U/L (ref 11–51)

## 2022-09-19 LAB — PROTIME-INR
INR: 1.1 (ref 0.8–1.2)
Prothrombin Time: 13.7 seconds (ref 11.4–15.2)

## 2022-09-19 LAB — GLUCOSE, CAPILLARY
Glucose-Capillary: 123 mg/dL — ABNORMAL HIGH (ref 70–99)
Glucose-Capillary: 147 mg/dL — ABNORMAL HIGH (ref 70–99)

## 2022-09-19 LAB — APTT: aPTT: 24 seconds (ref 24–36)

## 2022-09-19 LAB — PROCALCITONIN: Procalcitonin: <0.1 ng/mL

## 2022-09-19 MED ORDER — SODIUM CHLORIDE 0.9 % IV BOLUS
1000.0000 mL | Freq: Once | INTRAVENOUS | Status: AC
  Administered 2022-09-19: 1000 mL via INTRAVENOUS

## 2022-09-19 MED ORDER — ACETAMINOPHEN 325 MG PO TABS
650.0000 mg | ORAL_TABLET | Freq: Four times a day (QID) | ORAL | Status: DC | PRN
  Administered 2022-09-19: 650 mg via ORAL
  Filled 2022-09-19: qty 2

## 2022-09-19 MED ORDER — ROSUVASTATIN CALCIUM 10 MG PO TABS
10.0000 mg | ORAL_TABLET | Freq: Every day | ORAL | Status: DC
  Administered 2022-09-19 – 2022-09-23 (×5): 10 mg via ORAL
  Filled 2022-09-19 (×5): qty 1

## 2022-09-19 MED ORDER — ASPIRIN 81 MG PO TBEC
81.0000 mg | DELAYED_RELEASE_TABLET | Freq: Every morning | ORAL | Status: DC
  Administered 2022-09-19 – 2022-09-23 (×5): 81 mg via ORAL
  Filled 2022-09-19 (×5): qty 1

## 2022-09-19 MED ORDER — OYSTER SHELL CALCIUM/D3 500-5 MG-MCG PO TABS
1.0000 | ORAL_TABLET | Freq: Every day | ORAL | Status: DC
  Administered 2022-09-21 – 2022-09-23 (×3): 1 via ORAL
  Filled 2022-09-19 (×3): qty 1

## 2022-09-19 MED ORDER — FERROUS SULFATE 325 (65 FE) MG PO TABS
325.0000 mg | ORAL_TABLET | Freq: Every day | ORAL | Status: DC
  Administered 2022-09-21 – 2022-09-23 (×3): 325 mg via ORAL
  Filled 2022-09-19 (×3): qty 1

## 2022-09-19 MED ORDER — HYDRALAZINE HCL 20 MG/ML IJ SOLN: 5.0000 mg | INTRAMUSCULAR | Status: DC | PRN

## 2022-09-19 MED ORDER — HYDROMORPHONE HCL 1 MG/ML IJ SOLN
0.5000 mg | Freq: Once | INTRAMUSCULAR | Status: AC
  Administered 2022-09-19: 0.5 mg via INTRAVENOUS
  Filled 2022-09-19: qty 0.5

## 2022-09-19 MED ORDER — SODIUM CHLORIDE 0.9 % IV BOLUS (SEPSIS)
500.0000 mL | Freq: Once | INTRAVENOUS | Status: AC
  Administered 2022-09-19: 500 mL via INTRAVENOUS

## 2022-09-19 MED ORDER — TAMSULOSIN HCL 0.4 MG PO CAPS
0.4000 mg | ORAL_CAPSULE | Freq: Every day | ORAL | Status: DC
  Administered 2022-09-19 – 2022-09-23 (×5): 0.4 mg via ORAL
  Filled 2022-09-19 (×5): qty 1

## 2022-09-19 MED ORDER — RISAQUAD PO CAPS
1.0000 | ORAL_CAPSULE | Freq: Every day | ORAL | Status: DC
  Administered 2022-09-20 – 2022-09-23 (×4): 1 via ORAL
  Filled 2022-09-19 (×4): qty 1

## 2022-09-19 MED ORDER — OXYCODONE-ACETAMINOPHEN 5-325 MG PO TABS
1.0000 | ORAL_TABLET | ORAL | Status: DC | PRN
  Administered 2022-09-19 – 2022-09-20 (×3): 1 via ORAL
  Filled 2022-09-19 (×4): qty 1

## 2022-09-19 MED ORDER — SODIUM CHLORIDE 0.9 % IV SOLN: INTRAVENOUS | Status: DC

## 2022-09-19 MED ORDER — ONDANSETRON HCL 4 MG/2ML IJ SOLN
4.0000 mg | Freq: Three times a day (TID) | INTRAMUSCULAR | Status: DC | PRN
  Administered 2022-09-19: 4 mg via INTRAVENOUS
  Filled 2022-09-19: qty 2

## 2022-09-19 MED ORDER — ONDANSETRON HCL 4 MG/2ML IJ SOLN
4.0000 mg | Freq: Once | INTRAMUSCULAR | Status: AC
  Administered 2022-09-19: 4 mg via INTRAVENOUS
  Filled 2022-09-19: qty 2

## 2022-09-19 MED ORDER — HYDROMORPHONE HCL 1 MG/ML IJ SOLN
0.2500 mg | Freq: Once | INTRAMUSCULAR | Status: AC
  Administered 2022-09-19: 0.25 mg via INTRAVENOUS
  Filled 2022-09-19: qty 0.5

## 2022-09-19 MED ORDER — HYDROMORPHONE HCL 1 MG/ML IJ SOLN
0.5000 mg | INTRAMUSCULAR | Status: DC | PRN
  Administered 2022-09-19: 0.5 mg via INTRAVENOUS
  Filled 2022-09-19: qty 0.5

## 2022-09-19 MED ORDER — METHIMAZOLE 5 MG PO TABS
5.0000 mg | ORAL_TABLET | ORAL | Status: DC
  Administered 2022-09-20 – 2022-09-22 (×2): 5 mg via ORAL
  Filled 2022-09-19: qty 1
  Filled 2022-09-19: qty 2

## 2022-09-19 MED ORDER — FAMOTIDINE 20 MG PO TABS
20.0000 mg | ORAL_TABLET | Freq: Two times a day (BID) | ORAL | Status: DC
  Administered 2022-09-19 – 2022-09-23 (×8): 20 mg via ORAL
  Filled 2022-09-19 (×8): qty 1

## 2022-09-19 MED ORDER — MESALAMINE 400 MG PO CPDR
800.0000 mg | DELAYED_RELEASE_CAPSULE | Freq: Three times a day (TID) | ORAL | Status: DC
  Administered 2022-09-19 – 2022-09-23 (×12): 800 mg via ORAL
  Filled 2022-09-19 (×13): qty 2

## 2022-09-19 MED ORDER — MECLIZINE HCL 25 MG PO TABS: 25.0000 mg | ORAL_TABLET | Freq: Three times a day (TID) | ORAL | Status: DC | PRN

## 2022-09-19 MED ORDER — METHIMAZOLE 2.5 MG HALF TABLET
2.5000 mg | ORAL_TABLET | ORAL | Status: DC
  Administered 2022-09-19 – 2022-09-21 (×2): 2.5 mg via ORAL
  Filled 2022-09-19 (×4): qty 1

## 2022-09-19 MED ORDER — FENTANYL CITRATE PF 50 MCG/ML IJ SOSY
50.0000 ug | PREFILLED_SYRINGE | Freq: Once | INTRAMUSCULAR | Status: AC
  Administered 2022-09-19: 50 ug via INTRAVENOUS
  Filled 2022-09-19: qty 1

## 2022-09-19 MED ORDER — FLUTICASONE PROPIONATE 50 MCG/ACT NA SUSP: 1.0000 | Freq: Every evening | NASAL | Status: DC | PRN

## 2022-09-19 MED ORDER — HYDROCORTISONE SOD SUC (PF) 100 MG IJ SOLR
100.0000 mg | Freq: Once | INTRAMUSCULAR | Status: AC
  Administered 2022-09-19: 100 mg via INTRAVENOUS
  Filled 2022-09-19: qty 2

## 2022-09-19 MED ORDER — EZETIMIBE 10 MG PO TABS
10.0000 mg | ORAL_TABLET | Freq: Every day | ORAL | Status: DC
  Administered 2022-09-19 – 2022-09-23 (×5): 10 mg via ORAL
  Filled 2022-09-19 (×5): qty 1

## 2022-09-19 MED ORDER — SODIUM CHLORIDE 0.9 % IV SOLN
1.0000 g | Freq: Once | INTRAVENOUS | Status: AC
  Administered 2022-09-19: 1 g via INTRAVENOUS
  Filled 2022-09-19: qty 10

## 2022-09-19 MED ORDER — CIPROFLOXACIN IN D5W 400 MG/200ML IV SOLN
400.0000 mg | Freq: Two times a day (BID) | INTRAVENOUS | Status: DC
  Administered 2022-09-19 – 2022-09-22 (×5): 400 mg via INTRAVENOUS
  Filled 2022-09-19 (×6): qty 200

## 2022-09-19 MED ORDER — DEXTROSE 50 % IV SOLN: 50.0000 mL | INTRAVENOUS | Status: DC | PRN

## 2022-09-19 MED ORDER — SODIUM CHLORIDE 0.9 % IV SOLN
1.0000 g | INTRAVENOUS | Status: DC
  Administered 2022-09-20: 1 g via INTRAVENOUS
  Filled 2022-09-19: qty 1

## 2022-09-19 MED ORDER — VITAMIN C 500 MG PO TABS
1000.0000 mg | ORAL_TABLET | Freq: Every morning | ORAL | Status: DC
  Administered 2022-09-20 – 2022-09-23 (×4): 1000 mg via ORAL
  Filled 2022-09-19 (×4): qty 2

## 2022-09-19 NOTE — Consult Note (Signed)
Urology Consult  I have been asked to see the patient by Dr. Brien Few, for evaluation and management of right ureteral stone.  Chief Complaint: right flank apin  History of Present Illness: Misty Page is a 82 y.o. year old female who presented to the emergency room with several hours of severe right flank pain.  CT indicated a 3 mm right proximal ureteral stone, mild leukocytosis to 16 and a mildly suspicious urine but her labs and vitals were otherwise stable.  She was admitted for pain control.  Later this evening, she has progressively developed softer blood pressures as low as 81/50 which responded to an IV bolus.  Heart rates in the 90s.  She did also develop a very low 99 temperature.  Her pain persist.  I called and spoke to Mrs. Misty Page and separately her daughter, Misty Page, by telephone this evening.  [name] endorsed continued pain and some mild nausea but otherwise was feeling slightly better.  Notably, she was seen once in the emergency room in the remote past for kidney stones but never required surgical intervention.  She is being admitted to the hospitalist service with supportive care.  She has been started on IV ceftriaxone.  Urine cultures are pending.  Most recent urine culture from 2022 grew Enterococcus which is clinically resistant to cephalosporins.  Past Medical History:  Diagnosis Date   Allergy    Anemia    Arthritis    CA of skin 10/15/2014   Calculus of kidney 10/15/2014   Seen in ER 06/16/10.    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   Coronary artery disease    Diverticulitis    Environmental and seasonal allergies    Fibrocystic breast disease (FCBD)    GERD (gastroesophageal reflux disease)    Heart murmur    High cholesterol    History of heart artery stent    Hyperlipidemia    Hypertension    Hyperthyroidism    Myocardial infarction    Osteomyelitis of left femur 11/30/2018    Osteoporosis    Reactive depression 02/25/2019   Thyroid disease    TIA (transient ischemic attack)     Past Surgical History:  Procedure Laterality Date   ABDOMINAL HYSTERECTOMY  2007   BREAST ENHANCEMENT SURGERY  2004   BREAST IMPLANT REMOVAL     BREAST SURGERY  1984   Fibrocystic Disease   CAROTID ENDARTERECTOMY Left 06/21/2014   Dr. Gilda Crease   CAROTID ENDARTERECTOMY Right 08/02/2014   Dr. Gilda Crease   CAROTID PTA/STENT INTERVENTION N/A 06/15/2017   Procedure: CAROTID PTA/STENT INTERVENTION;  Surgeon: Renford Dills, MD;  Location: [facility] IN[facility]IVE [facility];  Service: Cardiovascular;  Laterality: N/A;   COLONOSCOPY WITH PROPOFOL N/A 07/14/2015   Procedure: COLONOSCOPY WITH PROPOFOL;  Surgeon: Wallace Cullens, MD;  Location: [facility] ENDOSCOPY;  Service: Gastroenterology;  Laterality: N/A;   COLONOSCOPY WITH PROPOFOL N/A 02/08/2018   Procedure: COLONOSCOPY WITH PROPOFOL;  Surgeon: Toledo, Boykin Nearing, MD;  Location: [facility] ENDOSCOPY;  Service: Gastroenterology;  Laterality: N/A;   CORONARY ANGIOPLASTY WITH STENT PLACEMENT  1999   ESOPHAGOGASTRODUODENOSCOPY (EGD) WITH PROPOFOL N/A 02/08/2018   Procedure: ESOPHAGOGASTRODUODENOSCOPY (EGD) WITH PROPOFOL;  Surgeon: Toledo, Boykin Nearing, MD;  Location: [facility] ENDOSCOPY;  Service: Gastroenterology;  Laterality: N/A;   FEMORAL ARTERY STENT  08/2003   HERNIA REPAIR  2008   LEG AMPUTATION AT HIP Left 02/28/2019   MASTECTOMY  RENAL ARTERY STENT  08/2003   TRANSUMBILICAL AUGMENTATION MAMMAPLASTY     VASCULAR SURGERY     VISCERAL ARTERY INTERVENTION N/A 02/13/2020   Procedure: VISCERAL ARTERY INTERVENTION;  Surgeon: Annice Needy, MD;  Location: [facility] INV[facility] CV [facility];  Service: Cardiovascular;  Laterality: N/A;    Home Medications:  Current Meds  Medication Sig   acetaminophen (TYLENOL) 500 MG tablet Take 500-1,000 mg by mouth every 8 (eight) hours as needed.   amLODipine (NORVASC) 5 MG tablet Take 1 tablet (5 mg total) by mouth daily.   Ascorbic Acid (VITAMIN  C) 1000 MG tablet TAKE 1 TABLET BY MOUTH EVERY MORNING   Bacillus Coagulans-Inulin (PROBIOTIC-PREBIOTIC) 1-250 BILLION-MG CAPS TAKE 1 CAPSULE BY MOUTH DAILY   BAYER LOW DOSE 81 MG tablet TAKE 1 TABLET BY MOUTH EVERY MORNING   Calcium Carb-Cholecalciferol (CALCIUM+D3) 600-20 MG-MCG TABS Take 1 tablet by mouth daily.   clopidogrel (PLAVIX) 75 MG tablet TAKE 1 TABLET BY MOUTH DAILY (Patient taking differently: Take 75 mg by mouth daily.)   ezetimibe (ZETIA) 10 MG tablet Take 1 tablet (10 mg total) by mouth daily.   famotidine (PEPCID) 20 MG tablet Take 1 tablet (20 mg total) by mouth 2 (two) times daily.   ferrous sulfate 325 (65 FE) MG tablet Take 1 tablet (325 mg total) by mouth daily with breakfast.   fluticasone (FLONASE) 50 MCG/ACT nasal spray USE 2 SPRAYS EACH NOSTRIL DAILY (Patient taking differently: Place 1 spray into both nostrils at bedtime as needed for allergies.)   losartan (COZAAR) 100 MG tablet TAKE 1 TABLET BY MOUTH DAILY (Patient taking differently: Take 100 mg by mouth daily.)   meclizine (ANTIVERT) 25 MG tablet Take 1 tablet (25 mg total) by mouth 3 (three) times daily as needed for dizziness or nausea.   Mesalamine 800 MG TBEC Take 800 mg by mouth in the morning, at noon, and at bedtime.   methimazole (TAPAZOLE) 5 MG tablet Take 5 mg by mouth. 1 Tablet on Mondays, Wednesdays and Fridays and take 1/2 tablet daily the rest of the week.   rosuvastatin (CRESTOR) 20 MG tablet TAKE ONE-HALF TABLET BY MOUTH DAILY    Allergies:  Allergies  Allergen Reactions   Amoxicillin Anaphylaxis   Cephalosporins Swelling and Rash    Lip swelling and rash on CTX and Vancomycin.   Naproxen Hives, Shortness Of Breath, Nausea And Vomiting and Swelling   Penicillins Shortness Of Breath, Swelling and Rash    Tolerated ceftriaxone 08/2022   Codeine Nausea And Vomiting   Levofloxacin Nausea And Vomiting   Shellfish Allergy Hives, Diarrhea, Nausea And Vomiting and Swelling   Vancomycin Swelling and  Rash    Occurred while on CTX and Vancomycin. More likely CTX as she has anaphylaxis to PCN, but should monitor closely.    Family History  Problem Relation Age of Onset   Hyperlipidemia Mother    Congestive Heart Failure Mother    COPD Mother    Heart disease Mother    Hypertension Mother    Osteoporosis Mother    Pancreatic cancer Father    Arthritis Sister    Alzheimer's disease Sister    Hypertension Sister    Prostate cancer Brother    Heart attack Brother    Stomach cancer Brother    Kidney failure Brother    Leukemia Brother    Emphysema Brother    Pancreatic cancer Brother    Stomach cancer Brother    Breast cancer Paternal Grandmother    Leukemia Paternal  Grandfather    Heart attack Maternal Uncle    Stroke Maternal Aunt     Social History:  reports that she quit smoking about 28 years ago. Her smoking use included cigarettes. She has never used smokeless tobacco. She reports that she does not currently use alcohol. She reports that she does not use drugs.  ROS: A complete review of systems was performed.  All systems are negative except for pertinent findings as noted.  Physical Exam:  Vital signs in last 24 hours: Temp:  [97.7 F (36.5 C)-99.3 F (37.4 C)] 99 F (37.2 C) (04/21 1931) Pulse Rate:  [83-100] 95 (04/21 1931) Resp:  [14-19] 18 (04/21 1931) BP: (81-160)/(50-93) 89/52 (04/21 1931) SpO2:  [90 %-99 %] 95 % (04/21 1931) Weight:  [59 kg] 59 kg (04/21 0157) Pending ...   Laboratory Data:  Recent Labs    09/19/22 0200  WBC 16.0*  HGB 9.5*  HCT 32.9*   Recent Labs    09/19/22 0200  NA 135  K 3.8  CL 103  CO2 18*  GLUCOSE 157*  BUN 16  CREATININE 0.56  CALCIUM 9.0   Recent Labs    09/19/22 0847  INR 1.1   No results for input(s): "LABURIN" in the last 72 hours. Results for orders placed or performed in visit on 05/04/21  Urine Culture     Status: Abnormal   Collection Time: 05/04/21 12:00 AM   Specimen: Urine   Urine  Result  Value Ref Range Status   Urine Culture, Routine Final report (A)  Final   Organism ID, Bacteria Enterococcus faecalis (A)  Final    Comment: For Enterococcus species, aminoglycosides (except for high-level resistance screening), cephalosporins, clindamycin, and trimethoprim-sulfamethoxazole are not effective clinically. (CLSI, M100-S26, 2016) Greater than 100,000 colony forming units per mL    Antimicrobial Susceptibility Comment  Final    Comment:       ** S = Susceptible; I = Intermediate; R = Resistant **                    P = Positive; N = Negative             MICS are expressed in micrograms per mL    Antibiotic                 RSLT#1    RSLT#2    RSLT#3    RSLT#4 Ciprofloxacin                  S Levofloxacin                   S Nitrofurantoin                 S Penicillin                     S Tetracycline                   R Vancomycin                     S      Radiologic Imaging: CT Renal Stone Study  Result Date: 09/19/2022 CLINICAL DATA:  Right flank pain EXAM: CT ABDOMEN AND PELVIS WITHOUT CONTRAST TECHNIQUE: Multidetector CT imaging of the abdomen and pelvis was performed following the standard protocol without IV contrast. RADIATION DOSE REDUCTION: This exam was performed according to the departmental dose-optimization program which includes automated exposure  control, adjustment of the mA and/or kV according to patient size and/or use of iterative reconstruction technique. COMPARISON:  None Available. FINDINGS: Lower Chest: 4 mm nodule in the right middle lobe (4:15). Mild lingular atelectasis. Hepatobiliary: Normal hepatic contours. No intra- or extrahepatic biliary dilatation. The gallbladder is normal. Pancreas: Normal pancreas. No ductal dilatation or peripancreatic fluid collection. Spleen: Normal. Adrenals/Urinary Tract: The adrenal glands are normal. There is a 3 mm stone in the proximal right ureter causing mild hydroureteronephrosis. There are multiple  nonobstructing stones of the right kidney, the largest measuring 8 mm. Multiple nonobstructing left renal calcifications. 3.5 cm [place] class 1 left renal cyst, no follow-up needed. The urinary bladder is normal for degree of distention Stomach/Bowel: There is no hiatal hernia. Normal duodenal course and caliber. No small bowel dilatation or inflammation. Rectosigmoid diverticulosis without acute inflammation. Normal appendix. Vascular/Lymphatic: There is calcific atherosclerosis of the abdominal aorta. No lymphadenopathy. Reproductive: Normal uterus. No adnexal mass. Other: None. Musculoskeletal: Absent left femur IMPRESSION: 1. A 3 mm stone in the proximal right ureter causing mild hydroureteronephrosis. 2. Multiple nonobstructing stones of the both kidneys, the largest measuring 8 mm. 3. Right solid pulmonary nodule measuring 4 mm. Per Fleischner Society Guidelines, no routine follow-up imaging is recommended. These guidelines do not apply to immunocompromised patients and patients with cancer. Follow up in patients with significant comorbidities as clinically warranted. For lung cancer screening, adhere to Lung-RADS guidelines. Reference: Radiology. 2017; 284(1):228-43. 4. Rectosigmoid diverticulosis without acute inflammation. Aortic Atherosclerosis (ICD10-I70.0). Electronically Signed   By: Deatra Robinson M.D.   On: 09/19/2022 03:33    CT scan personally reviewed.  There is a 3 mm stone in the axial plane however in the longitudinal plane, the stone measures 7 or 8 mm.  There is obstructive changes.  Impression/ Plan:  Right ureteral stones, initially admitted for pain control now meeting SIRS criteria. I have recommended urgent right ureteral stent placement.  He is not currently NPO.  Given that she has responded nicely to the bolus and she has not otherwise spiked high fever, we will plan for continued supportive care overnight and will broaden IV antibiotics to include Cipro based on her most recent  urine culture which was in 2022 as a precaution.  I discussed right ureteral stent procedure with the patient by telephone tonight as well as personally by phone with her daughter Misty Page.  We discussed the procedure itself, intention of the procedure, need for staged procedure down the road, risk of bleeding, infection, stent pain, amongst others.  All questions were answered.  If her clinical status worsens overnight, please contact urology and we will move the procedure to emergent.  I also discussed the case with her team as well as floor nurse.  She will be n.p.o. at 11 PM procedure scheduled at 7 AM.  Anesthesia is in agreement with this plan.  09/19/2022, 8:24 PM  [name],  MD

## 2022-09-19 NOTE — ED Triage Notes (Signed)
FIRST NURSE NOTE:  Pt arrived via [facility] from home, c/o R flank pain, hx of kidney stones in the past, pain began 1 hour PTA, pt c/o nausea.   184/82 P-70s 98% RA

## 2022-09-19 NOTE — H&P (Deleted)
History and Physical    JELANI VREELAND ZOX:[id number] DOB: [birth year] DOA: 09/19/2022  Referring MD/NP/PA:   PCP: Erasmo Downer, MD   Patient coming from:  The patient is coming from home.    Chief Complaint: Right flank pain  HPI: Misty Page is a 82 y.o. 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  Patient states that her right flank pain started last night, which is constant, severe, unbearable, sharp, nonradiating.  Denies dysuria, burning on urination or hematuria.  She has slightly increased urinary frequency.  She has nausea and vomited few times with nonbilious nonbloody vomiting.  Denies diarrhea or abdominal pain.  No chest pain, cough, shortness of breath.  Data reviewed independently and ED Course: pt was found to have WBC 16.0, positive urinalysis (hazy appearance, small pulm leukocyte, rare bacteria from WBC 21-50, RBC/hpf > 50), GFR> 60.  Temperature normal, blood pressure 124/66, heart rate 80s --> 96-97, RR 19, oxygen saturation 97% on room air.  Patient is admitted to MedSurg bed as inpatient.  Dr. Apolinar Junes of urology is consulted.  CT renal stone protocol: 1. A 3 mm stone in the proximal right ureter causing mild hydroureteronephrosis. 2. Multiple nonobstructing stones of the both kidneys, the largest measuring 8 mm. 3. Right solid pulmonary nodule measuring 4 mm. Per Fleischner Society Guidelines, no routine follow-up imaging is recommended. These guidelines do not apply to immunocompromised patients and patients with cancer. Follow up in patients with significant comorbidities as clinically warranted. For lung cancer screening, adhere to Lung-RADS guidelines. Reference: Radiology. 2017; 284(1):228-43. 4. Rectosigmoid diverticulosis without  acute inflammation.   Aortic Atherosclerosis (ICD10-I70.0).  EKG: I have personally reviewed.  Sinus rhythm, QTc 454, early R wave progression, low voltage   Review of Systems:   General: no fevers, chills, no body weight gain, has poor appetite, has fatigue HEENT: no blurry vision, hearing changes or sore throat Respiratory: no dyspnea, coughing, wheezing CV: no chest pain, no palpitations GI: no nausea, vomiting, abdominal pain, diarrhea, constipation GU: no dysuria, burning on urination, has increased urinary frequency, no hematuria. Ext: no leg edema Neuro: no unilateral weakness, numbness, or tingling, no vision change or hearing loss Skin: no rash, no skin tear. MSK: No muscle spasm, no deformity, no limitation of range of movement in spin. Has right flank pain Heme: No easy bruising.  Travel history: No recent long distant travel.   Allergy:  Allergies  Allergen Reactions   Amoxicillin Anaphylaxis   Naproxen Hives, Shortness Of Breath, Nausea And Vomiting and Swelling   Penicillins Shortness Of Breath, Swelling and Rash    Tolerated ceftriaxone 08/2022   Codeine Nausea And Vomiting   Levofloxacin Nausea And Vomiting   Shellfish Allergy Hives, Diarrhea, Nausea And Vomiting and Swelling    Past Medical History:  Diagnosis Date   Allergy    Anemia    Arthritis    CA of skin 10/15/2014   Calculus of kidney 10/15/2014   Seen in ER 06/16/10.    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   Coronary artery disease    Diverticulitis    Environmental and seasonal allergies    Fibrocystic breast disease (FCBD)    GERD (gastroesophageal reflux disease)    Heart murmur    High  cholesterol    History of heart artery stent    Hyperlipidemia    Hypertension    Hyperthyroidism    Myocardial infarction    Osteomyelitis of left femur 11/30/2018   Osteoporosis    Reactive depression 02/25/2019   Thyroid disease     TIA (transient ischemic attack)     Past Surgical History:  Procedure Laterality Date   ABDOMINAL HYSTERECTOMY  2007   BREAST ENHANCEMENT SURGERY  2004   BREAST IMPLANT REMOVAL     BREAST SURGERY  1984   Fibrocystic Disease   CAROTID ENDARTERECTOMY Left 06/21/2014   Dr. Gilda Crease   CAROTID ENDARTERECTOMY Right 08/02/2014   Dr. Gilda Crease   CAROTID PTA/STENT INTERVENTION N/A 06/15/2017   Procedure: CAROTID PTA/STENT INTERVENTION;  Surgeon: Renford Dills, MD;  Location: [facility] INVASIVE CV LAB;  Service: Cardiovascular;  Laterality: N/A;   COLONOSCOPY WITH PROPOFOL N/A 07/14/2015   Procedure: COLONOSCOPY WITH PROPOFOL;  Surgeon: Wallace Cullens, MD;  Location: [facility];  Service: Gastroenterology;  Laterality: N/A;   COLONOSCOPY WITH PROPOFOL N/A 02/08/2018   Procedure: COLONOSCOPY WITH PROPOFOL;  Surgeon: Toledo, Boykin Nearing, MD;  Location: [facility] ENDOSCOPY;  Service: Gastroenterology;  Laterality: N/A;   CORONARY ANGIOPLASTY WITH STENT PLACEMENT  1999   ESOPHAGOGASTRODUODENOSCOPY (EGD) WITH PROPOFOL N/A 02/08/2018   Procedure: ESOPHAGOGASTRODUODENOSCOPY (EGD) WITH PROPOFOL;  Surgeon: Toledo, Boykin Nearing, MD;  Location: [facility] ENDOSCOPY;  Service: Gastroenterology;  Laterality: N/A;   FEMORAL ARTERY STENT  08/2003   HERNIA REPAIR  2008   LEG AMPUTATION AT HIP Left 02/28/2019   MASTECTOMY     RENAL ARTERY STENT  08/2003   TRANSUMBILICAL AUGMENTATION MAMMAPLASTY     VASCULAR SURGERY     VISCERAL ARTERY INTERVENTION N/A 02/13/2020   Procedure: VISCERAL ARTERY INTERVENTION;  Surgeon: Annice Needy, MD;  Location: [facility] INVASIVE CV LAB;  Service: Cardiovascular;  Laterality: N/A;    Social History:  reports that she quit smoking about 28 years ago. Her smoking use included cigarettes. She has never used smokeless tobacco. She reports that she does not currently use alcohol. She reports that she does not use drugs.  Family History:  Family History  Problem Relation Age of Onset   Hyperlipidemia Mother     Congestive Heart Failure Mother    COPD Mother    Heart disease Mother    Hypertension Mother    Osteoporosis Mother    Pancreatic cancer Father    Arthritis Sister    Alzheimer's disease Sister    Hypertension Sister    Prostate cancer Brother    Heart attack Brother    Stomach cancer Brother    Kidney failure Brother    Leukemia Brother    Emphysema Brother    Pancreatic cancer Brother    Stomach cancer Brother    Breast cancer Paternal Grandmother    Leukemia Paternal Grandfather    Heart attack Maternal Uncle    Stroke Maternal Aunt      Prior to Admission medications   Medication Sig Start Date End Date Taking? Authorizing Provider  acetaminophen (TYLENOL) 500 MG tablet Take 500-1,000 mg by mouth every 8 (eight) hours as needed.    [provider]  amLODipine (NORVASC) 5 MG tablet Take 1 tablet (5 mg total) by mouth daily. 07/23/22   Erasmo Downer, MD  Ascorbic Acid (VITAMIN C) 1000 MG tablet TAKE 1 TABLET BY MOUTH EVERY MORNING 06/04/22   [name], Marzella Schlein, MD  Bacillus Coagulans-Inulin (PROBIOTIC-PREBIOTIC) 1-250 BILLION-MG CAPS TAKE 1  CAPSULE BY MOUTH DAILY 09/06/22   Erasmo Downer, MD  BAYER LOW DOSE 81 MG tablet TAKE 1 TABLET BY MOUTH EVERY MORNING 01/28/22   Jacky Kindle, FNP  Calcium Carb-Cholecalciferol (CALCIUM+D3) 600-20 MG-MCG TABS Take 1 tablet by mouth daily. 07/05/22   Erasmo Downer, MD  clopidogrel (PLAVIX) 75 MG tablet TAKE 1 TABLET BY MOUTH DAILY 04/20/22   Erasmo Downer, MD  ezetimibe (ZETIA) 10 MG tablet Take 1 tablet (10 mg total) by mouth daily. 07/23/22   Erasmo Downer, MD  famotidine (PEPCID) 20 MG tablet Take 1 tablet (20 mg total) by mouth 2 (two) times daily. 07/23/22   Erasmo Downer, MD  ferrous sulfate 325 (65 FE) MG tablet Take 1 tablet (325 mg total) by mouth daily with breakfast. 07/27/22   [name], Marzella Schlein, MD  fluticasone (FLONASE) 50 MCG/ACT nasal spray USE 2 SPRAYS EACH NOSTRIL  DAILY Patient taking differently: Place 1 spray into both nostrils at bedtime as needed. 08/21/21   Erasmo Downer, MD  losartan (COZAAR) 100 MG tablet TAKE 1 TABLET BY MOUTH DAILY 04/20/22   [name], Marzella Schlein, MD  meclizine (ANTIVERT) 25 MG tablet Take 1 tablet (25 mg total) by mouth 3 (three) times daily as needed for dizziness or nausea. 04/12/21   Sharman Cheek, MD  Mesalamine 800 MG TBEC Take 800 mg by mouth in the morning, at noon, and at bedtime. 09/26/17   [provider]  methimazole (TAPAZOLE) 5 MG tablet Take 5 mg by mouth. 1 Tablet on Mondays, Wednesdays and Fridays and take 1/2 tablet daily the rest of the week. 03/30/17   [provider]  Probiotic Product (PROBIOTIC DAILY PO) Take 1 capsule by mouth daily.    [provider]  rosuvastatin (CRESTOR) 20 MG tablet TAKE ONE-HALF TABLET BY MOUTH DAILY 05/21/22   Erasmo Downer, MD    Physical Exam: Vitals:   09/19/22 0600 09/19/22 0630 09/19/22 0700 09/19/22 0820  BP: 111/65 128/69 124/66 133/68  Pulse: 97 88 86 96  Resp:    14  Temp:    98 F (36.7 C)  TempSrc:      SpO2: 97% 98% 98% 96%  Weight:       General: Not in acute distress HEENT:       Eyes: PERRL, EOMI, no scleral icterus.       ENT: No discharge from the ears and nose, no pharynx injection, no tonsillar enlargement.        Neck: No JVD, no bruit, no mass felt. Heme: No neck lymph node enlargement. Cardiac: S1/S2, RRR, No murmurs, No gallops or rubs. Respiratory: No rales, wheezing, rhonchi or rubs. GI: Soft, nondistended, nontender, no rebound pain, no organomegaly, BS present. GG: has positive right CVA tenderness Ext: No pitting leg edema bilaterally. 1+DP/PT pulse bilaterally. Musculoskeletal: No joint deformities, No joint redness or warmth, no limitation of ROM in spin. Skin: No rashes.  Neuro: Alert, oriented X3, cranial nerves II-XII grossly intact, moves all extremities normally. Psych: Patient is not  psychotic, no suicidal or hemocidal ideation.  Labs on Admission: I have personally reviewed following labs and imaging studies  CBC: Recent Labs  Lab 09/19/22 0200  WBC 16.0*  HGB 9.5*  HCT 32.9*  MCV 76.3*  PLT 441*   Basic Metabolic Panel: Recent Labs  Lab 09/19/22 0200  NA 135  K 3.8  CL 103  CO2 18*  GLUCOSE 157*  BUN 16  CREATININE 0.56  CALCIUM 9.0  GFR: Estimated Creatinine Clearance: 43.6 mL/min (by C-G formula based on SCr of 0.56 mg/dL). Liver Function Tests: Recent Labs  Lab 09/19/22 0200  AST 26  ALT 15  ALKPHOS 62  BILITOT 0.3  PROT 7.2  ALBUMIN 4.1   Recent Labs  Lab 09/19/22 0200  LIPASE 37   No results for input(s): "AMMONIA" in the last 168 hours. Coagulation Profile: Recent Labs  Lab 09/19/22 0847  INR 1.1   Cardiac Enzymes: No results for input(s): "CKTOTAL", "CKMB", "CKMBINDEX", "TROPONINI" in the last 168 hours. BNP (last 3 results) Recent Labs    12/16/21 1034  PROBNP 147   HbA1C: No results for input(s): "HGBA1C" in the last 72 hours. CBG: No results for input(s): "GLUCAP" in the last 168 hours. Lipid Profile: No results for input(s): "CHOL", "HDL", "LDLCALC", "TRIG", "CHOLHDL", "LDLDIRECT" in the last 72 hours. Thyroid Function Tests: No results for input(s): "TSH", "T4TOTAL", "FREET4", "T3FREE", "THYROIDAB" in the last 72 hours. Anemia Panel: No results for input(s): "VITAMINB12", "FOLATE", "FERRITIN", "TIBC", "IRON", "RETICCTPCT" in the last 72 hours. Urine analysis:    Component Value Date/Time   COLORURINE YELLOW (A) 09/19/2022 0509   APPEARANCEUR HAZY (A) 09/19/2022 0509   APPEARANCEUR Clear 05/22/2014 2223   LABSPEC 1.011 09/19/2022 0509   LABSPEC 1.024 05/22/2014 2223   PHURINE 7.0 09/19/2022 0509   GLUCOSEU NEGATIVE 09/19/2022 0509   GLUCOSEU Negative 05/22/2014 2223   HGBUR LARGE (A) 09/19/2022 0509   BILIRUBINUR NEGATIVE 09/19/2022 0509   BILIRUBINUR Negative 05/04/2021 1406   BILIRUBINUR Negative  05/22/2014 2223   KETONESUR 5 (A) 09/19/2022 0509   PROTEINUR 30 (A) 09/19/2022 0509   UROBILINOGEN 0.2 05/04/2021 1406   NITRITE NEGATIVE 09/19/2022 0509   LEUKOCYTESUR SMALL (A) 09/19/2022 0509   LEUKOCYTESUR Trace 05/22/2014 2223   Sepsis Labs: @LABRCNTIP (procalcitonin:4,lacticidven:4) )No results found for this or any previous visit (from the past 240 hour(s)).   Radiological Exams on Admission: CT Renal Stone Study  Result Date: 09/19/2022 CLINICAL DATA:  Right flank pain EXAM: CT ABDOMEN AND PELVIS WITHOUT CONTRAST TECHNIQUE: Multidetector CT imaging of the abdomen and pelvis was performed following the standard protocol without IV contrast. RADIATION DOSE REDUCTION: This exam was performed according to the departmental dose-optimization program which includes automated exposure control, adjustment of the mA and/or kV according to patient size and/or use of iterative reconstruction technique. COMPARISON:  None Available. FINDINGS: Lower Chest: 4 mm nodule in the right middle lobe (4:15). Mild lingular atelectasis. Hepatobiliary: Normal hepatic contours. No intra- or extrahepatic biliary dilatation. The gallbladder is normal. Pancreas: Normal pancreas. No ductal dilatation or peripancreatic fluid collection. Spleen: Normal. Adrenals/Urinary Tract: The adrenal glands are normal. There is a 3 mm stone in the proximal right ureter causing mild hydroureteronephrosis. There are multiple nonobstructing stones of the right kidney, the largest measuring 8 mm. Multiple nonobstructing left renal calcifications. 3.5 cm [place] class 1 left renal cyst, no follow-up needed. The urinary bladder is normal for degree of distention Stomach/Bowel: There is no hiatal hernia. Normal duodenal course and caliber. No small bowel dilatation or inflammation. Rectosigmoid diverticulosis without acute inflammation. Normal appendix. Vascular/Lymphatic: There is calcific atherosclerosis of the abdominal aorta. No  lymphadenopathy. Reproductive: Normal uterus. No adnexal mass. Other: None. Musculoskeletal: Absent left femur IMPRESSION: 1. A 3 mm stone in the proximal right ureter causing mild hydroureteronephrosis. 2. Multiple nonobstructing stones of the both kidneys, the largest measuring 8 mm. 3. Right solid pulmonary nodule measuring 4 mm. Per Fleischner Society Guidelines, no routine follow-up imaging is  recommended. These guidelines do not apply to immunocompromised patients and patients with cancer. Follow up in patients with significant comorbidities as clinically warranted. For lung cancer screening, adhere to Lung-RADS guidelines. Reference: Radiology. 2017; 284(1):228-43. 4. Rectosigmoid diverticulosis without acute inflammation. Aortic Atherosclerosis (ICD10-I70.0). Electronically Signed   By: Deatra Robinson M.D.   On: 09/19/2022 03:33      Assessment/Plan Principal Problem:   Right ureteral stone Active Problems:   Hydroureteronephrosis   Severe sepsis   CAD (coronary artery disease)   Essential (primary) hypertension   Hypercholesteremia   Hyperthyroidism   Ulcerative colitis   History of TIA (transient ischemic attack)   Iron deficiency anemia   Lung nodule   Assessment and Plan:  Right ureteral stone with infection, and hydroureteronephros and severe sepsis: UA is positive for infection. Pt meets criteria for severe sepsis with WBC 16.0, heart rate 96-97.  Lactic acid is trending up 1.5 --> 3.5. Procalcitonin<0.01.  Hemodynamically stable.  Consulted Dr. Apolinar Junes of urology.  Since ureteral stone is small, 3 mm, and there is high possibility that the patient may pass stone spontaneously.,  No urgent procedure needed now.  -will admitted to MedSurg bed versus inpatient -IV Rocephin -Follow-up urine culture -Pain control: As needed Percocet, Dilaudid, Tylenol -IV fluid: 1.5 normal saline, then 75 cc/h -trend lactic acid levels -Start Flomax  CAD (coronary artery  disease) -Aspirin -Hold Plavix in case patient need procedure -Crestor  Essential (primary) hypertension -will hold Amlodipine, Cozaar since pt is at risk of develop hypotension due to sepsis -IV hydralazine as needed  Hypercholesteremia -Crestor, Zetia  Hyperthyroidism -Methimazole  Ulcerative colitis -Continue home mesalamine  History of TIA (transient ischemic attack) -Aspirin, Crestor -Hold Plavix  Iron deficiency anemia: Hemoglobin stable 9.5 (7.6 on 07/23/2022) -Continue home iron supplement  Lung nodule: This is incidental finding by CT scan. -Follow-up with PCP as outpatient      DVT ppx: SCD  Code Status: DNR per pt  Family Communication:   Yes, patient's daughter and son-in-law at bed side.    Disposition Plan:  Anticipate discharge back to previous environment  Consults called: Dr. Apolinar Junes of urology  Admission status and Level of care: Med-Surg:  as inpt      Dispo: The patient is from: Home              Anticipated d/c is to: Home              Anticipated d/c date is: 2 days              Patient currently is not medically stable to d/c.    Severity of Illness:  The appropriate patient status for this patient is INPATIENT. Inpatient status is judged to be reasonable and necessary in order to provide the required intensity of service to ensure the patient's safety. The patient's presenting symptoms, physical exam findings, and initial radiographic and laboratory data in the context of their chronic comorbidities is felt to place them at high risk for further clinical deterioration. Furthermore, it is not anticipated that the patient will be medically stable for discharge from the hospital within 2 midnights of admission.   * I certify that at the point of admission it is my clinical judgment that the patient will require inpatient hospital care spanning beyond 2 midnights from the point of admission due to high intensity of service, high risk for  further deterioration and high frequency of surveillance required.*       Date of Service  09/19/2022    Lorretta Harp [facility]   If 7PM-7AM, please contact night-coverage [email or web address] 09/19/2022, 12:30 PM

## 2022-09-19 NOTE — H&P (View-Only) (Signed)
     Urology Consult  I have been asked to see the patient by Dr. Xilin, for evaluation and management of right ureteral stone.  Chief Complaint: right flank apin  History of Present Illness: Misty Page is a 81 y.o. year old female who presented to the emergency room with several hours of severe right flank pain.  CT indicated a 3 mm right proximal ureteral stone, mild leukocytosis to 16 and a mildly suspicious urine but her labs and vitals were otherwise stable.  She was admitted for pain control.  Later this evening, she has progressively developed softer blood pressures as low as 81/50 which responded to an IV bolus.  Heart rates in the 90s.  She did also develop a very low 99 temperature.  Her pain persist.  I called and spoke to Misty Page and separately her daughter, Misty Page, by telephone this evening.  Misty Page endorsed continued pain and some mild nausea but otherwise was feeling slightly better.  Notably, she was seen once in the emergency room in the remote past for kidney stones but never required surgical intervention.  She is being admitted to the hospitalist service with supportive care.  She has been started on IV ceftriaxone.  Urine cultures are pending.  Most recent urine culture from 2022 grew Enterococcus which is clinically resistant to cephalosporins.  Past Medical History:  Diagnosis Date   Allergy    Anemia    Arthritis    CA of skin 10/15/2014   Calculus of kidney 10/15/2014   Seen in ER 06/16/10.    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   Coronary artery disease    Diverticulitis    Environmental and seasonal allergies    Fibrocystic breast disease (FCBD)    GERD (gastroesophageal reflux disease)    Heart murmur    High cholesterol    History of heart artery stent    Hyperlipidemia    Hypertension    Hyperthyroidism    Myocardial infarction    Osteomyelitis of left femur 11/30/2018    Osteoporosis    Reactive depression 02/25/2019   Thyroid disease    TIA (transient ischemic attack)     Past Surgical History:  Procedure Laterality Date   ABDOMINAL HYSTERECTOMY  2007   BREAST ENHANCEMENT SURGERY  2004   BREAST IMPLANT REMOVAL     BREAST SURGERY  1984   Fibrocystic Disease   CAROTID ENDARTERECTOMY Left 06/21/2014   Dr. Schnier   CAROTID ENDARTERECTOMY Right 08/02/2014   Dr. Schnier   CAROTID PTA/STENT INTERVENTION N/A 06/15/2017   Procedure: CAROTID PTA/STENT INTERVENTION;  Surgeon: Schnier, Gregory G, MD;  Location: [facility];  Service: Cardiovascular;  Laterality: N/A;   COLONOSCOPY WITH PROPOFOL N/A 07/14/2015   Procedure: COLONOSCOPY WITH PROPOFOL;  Surgeon: Paul Y Oh, MD;  Location: [facility];  Service: Gastroenterology;  Laterality: N/A;   COLONOSCOPY WITH PROPOFOL N/A 02/08/2018   Procedure: COLONOSCOPY WITH PROPOFOL;  Surgeon: Toledo, Teodoro K, MD;  Location: [facility];  Service: Gastroenterology;  Laterality: N/A;   CORONARY ANGIOPLASTY WITH STENT PLACEMENT  1999   ESOPHAGOGASTRODUODENOSCOPY (EGD) WITH PROPOFOL N/A 02/08/2018   Procedure: ESOPHAGOGASTRODUODENOSCOPY (EGD) WITH PROPOFOL;  Surgeon: Toledo, Teodoro K, MD;  Location: [facility];  Service: Gastroenterology;  Laterality: N/A;   FEMORAL ARTERY STENT  08/2003   HERNIA REPAIR  2008   LEG AMPUTATION AT HIP Left 02/28/2019   MASTECTOMY       RENAL ARTERY STENT  08/2003   TRANSUMBILICAL AUGMENTATION MAMMAPLASTY     VASCULAR SURGERY     VISCERAL ARTERY INTERVENTION N/A 02/13/2020   Procedure: VISCERAL ARTERY INTERVENTION;  Surgeon: Dew, Jason S, MD;  Location: [facility];  Service: Cardiovascular;  Laterality: N/A;    Home Medications:  Current Meds  Medication Sig   acetaminophen (TYLENOL) 500 MG tablet Take 500-1,000 mg by mouth every 8 (eight) hours as needed.   amLODipine (NORVASC) 5 MG tablet Take 1 tablet (5 mg total) by mouth daily.   Ascorbic Acid (VITAMIN  C) 1000 MG tablet TAKE 1 TABLET BY MOUTH EVERY MORNING   Bacillus Coagulans-Inulin (PROBIOTIC-PREBIOTIC) 1-250 BILLION-MG CAPS TAKE 1 CAPSULE BY MOUTH DAILY   BAYER LOW DOSE 81 MG tablet TAKE 1 TABLET BY MOUTH EVERY MORNING   Calcium Carb-Cholecalciferol (CALCIUM+D3) 600-20 MG-MCG TABS Take 1 tablet by mouth daily.   clopidogrel (PLAVIX) 75 MG tablet TAKE 1 TABLET BY MOUTH DAILY (Patient taking differently: Take 75 mg by mouth daily.)   ezetimibe (ZETIA) 10 MG tablet Take 1 tablet (10 mg total) by mouth daily.   famotidine (PEPCID) 20 MG tablet Take 1 tablet (20 mg total) by mouth 2 (two) times daily.   ferrous sulfate 325 (65 FE) MG tablet Take 1 tablet (325 mg total) by mouth daily with breakfast.   fluticasone (FLONASE) 50 MCG/ACT nasal spray USE 2 SPRAYS EACH NOSTRIL DAILY (Patient taking differently: Place 1 spray into both nostrils at bedtime as needed for allergies.)   losartan (COZAAR) 100 MG tablet TAKE 1 TABLET BY MOUTH DAILY (Patient taking differently: Take 100 mg by mouth daily.)   meclizine (ANTIVERT) 25 MG tablet Take 1 tablet (25 mg total) by mouth 3 (three) times daily as needed for dizziness or nausea.   Mesalamine 800 MG TBEC Take 800 mg by mouth in the morning, at noon, and at bedtime.   methimazole (TAPAZOLE) 5 MG tablet Take 5 mg by mouth. 1 Tablet on Mondays, Wednesdays and Fridays and take 1/2 tablet daily the rest of the week.   rosuvastatin (CRESTOR) 20 MG tablet TAKE ONE-HALF TABLET BY MOUTH DAILY    Allergies:  Allergies  Allergen Reactions   Amoxicillin Anaphylaxis   Cephalosporins Swelling and Rash    Lip swelling and rash on CTX and Vancomycin.   Naproxen Hives, Shortness Of Breath, Nausea And Vomiting and Swelling   Penicillins Shortness Of Breath, Swelling and Rash    Tolerated ceftriaxone 08/2022   Codeine Nausea And Vomiting   Levofloxacin Nausea And Vomiting   Shellfish Allergy Hives, Diarrhea, Nausea And Vomiting and Swelling   Vancomycin Swelling and  Rash    Occurred while on CTX and Vancomycin. More likely CTX as she has anaphylaxis to PCN, but should monitor closely.    Family History  Problem Relation Age of Onset   Hyperlipidemia Mother    Congestive Heart Failure Mother    COPD Mother    Heart disease Mother    Hypertension Mother    Osteoporosis Mother    Pancreatic cancer Father    Arthritis Sister    Alzheimer's disease Sister    Hypertension Sister    Prostate cancer Brother    Heart attack Brother    Stomach cancer Brother    Kidney failure Brother    Leukemia Brother    Emphysema Brother    Pancreatic cancer Brother    Stomach cancer Brother    Breast cancer Paternal Grandmother    Leukemia Paternal   Grandfather    Heart attack Maternal Uncle    Stroke Maternal Aunt     Social History:  reports that she quit smoking about 28 years ago. Her smoking use included cigarettes. She has never used smokeless tobacco. She reports that she does not currently use alcohol. She reports that she does not use drugs.  ROS: A complete review of systems was performed.  All systems are negative except for pertinent findings as noted.  Physical Exam:  Vital signs in last 24 hours: Temp:  [97.7 F (36.5 C)-99.3 F (37.4 C)] 99 F (37.2 C) (04/21 1931) Pulse Rate:  [83-100] 95 (04/21 1931) Resp:  [14-19] 18 (04/21 1931) BP: (81-160)/(50-93) 89/52 (04/21 1931) SpO2:  [90 %-99 %] 95 % (04/21 1931) Weight:  [59 kg] 59 kg (04/21 0157) Pending ...   Laboratory Data:  Recent Labs    09/19/22 0200  WBC 16.0*  HGB 9.5*  HCT 32.9*   Recent Labs    09/19/22 0200  NA 135  K 3.8  CL 103  CO2 18*  GLUCOSE 157*  BUN 16  CREATININE 0.56  CALCIUM 9.0   Recent Labs    09/19/22 0847  INR 1.1   No results for input(s): "LABURIN" in the last 72 hours. Results for orders placed or performed in visit on 05/04/21  Urine Culture     Status: Abnormal   Collection Time: 05/04/21 12:00 AM   Specimen: Urine   Urine  Result  Value Ref Range Status   Urine Culture, Routine Final report (A)  Final   Organism ID, Bacteria Enterococcus faecalis (A)  Final    Comment: For Enterococcus species, aminoglycosides (except for high-level resistance screening), cephalosporins, clindamycin, and trimethoprim-sulfamethoxazole are not effective clinically. (CLSI, M100-S26, 2016) Greater than 100,000 colony forming units per mL    Antimicrobial Susceptibility Comment  Final    Comment:       ** S = Susceptible; I = Intermediate; R = Resistant **                    P = Positive; N = Negative             MICS are expressed in micrograms per mL    Antibiotic                 RSLT#1    RSLT#2    RSLT#3    RSLT#4 Ciprofloxacin                  S Levofloxacin                   S Nitrofurantoin                 S Penicillin                     S Tetracycline                   R Vancomycin                     S      Radiologic Imaging: CT Renal Stone Study  Result Date: 09/19/2022 CLINICAL DATA:  Right flank pain EXAM: CT ABDOMEN AND PELVIS WITHOUT CONTRAST TECHNIQUE: Multidetector CT imaging of the abdomen and pelvis was performed following the standard protocol without IV contrast. RADIATION DOSE REDUCTION: This exam was performed according to the departmental dose-optimization program which includes automated exposure   control, adjustment of the mA and/or kV according to patient size and/or use of iterative reconstruction technique. COMPARISON:  None Available. FINDINGS: Lower Chest: 4 mm nodule in the right middle lobe (4:15). Mild lingular atelectasis. Hepatobiliary: Normal hepatic contours. No intra- or extrahepatic biliary dilatation. The gallbladder is normal. Pancreas: Normal pancreas. No ductal dilatation or peripancreatic fluid collection. Spleen: Normal. Adrenals/Urinary Tract: The adrenal glands are normal. There is a 3 mm stone in the proximal right ureter causing mild hydroureteronephrosis. There are multiple  nonobstructing stones of the right kidney, the largest measuring 8 mm. Multiple nonobstructing left renal calcifications. 3.5 cm [place] class 1 left renal cyst, no follow-up needed. The urinary bladder is normal for degree of distention Stomach/Bowel: There is no hiatal hernia. Normal duodenal course and caliber. No small bowel dilatation or inflammation. Rectosigmoid diverticulosis without acute inflammation. Normal appendix. Vascular/Lymphatic: There is calcific atherosclerosis of the abdominal aorta. No lymphadenopathy. Reproductive: Normal uterus. No adnexal mass. Other: None. Musculoskeletal: Absent left femur IMPRESSION: 1. A 3 mm stone in the proximal right ureter causing mild hydroureteronephrosis. 2. Multiple nonobstructing stones of the both kidneys, the largest measuring 8 mm. 3. Right solid pulmonary nodule measuring 4 mm. Per Fleischner Society Guidelines, no routine follow-up imaging is recommended. These guidelines do not apply to immunocompromised patients and patients with cancer. Follow up in patients with significant comorbidities as clinically warranted. For lung cancer screening, adhere to Lung-RADS guidelines. Reference: Radiology. 2017; 284(1):228-43. 4. Rectosigmoid diverticulosis without acute inflammation. Aortic Atherosclerosis (ICD10-I70.0). Electronically Signed   By: Kevin  Herman M.D.   On: 09/19/2022 03:33    CT scan personally reviewed.  There is a 3 mm stone in the axial plane however in the longitudinal plane, the stone measures 7 or 8 mm.  There is obstructive changes.  Impression/ Plan:  Right ureteral stones, initially admitted for pain control now meeting SIRS criteria. I have recommended urgent right ureteral stent placement.  He is not currently NPO.  Given that she has responded nicely to the bolus and she has not otherwise spiked high fever, we will plan for continued supportive care overnight and will broaden IV antibiotics to include Cipro based on her most recent  urine culture which was in 2022 as a precaution.  I discussed right ureteral stent procedure with the patient by telephone tonight as well as personally by phone with her daughter Misty Page.  We discussed the procedure itself, intention of the procedure, need for staged procedure down the road, risk of bleeding, infection, stent pain, amongst others.  All questions were answered.  If her clinical status worsens overnight, please contact urology and we will move the procedure to emergent.  I also discussed the case with her team as well as floor nurse.  She will be n.p.o. at 11 PM procedure scheduled at 7 AM.  Anesthesia is in agreement with this plan.  09/19/2022, 8:24 PM  Mikella Linsley,  MD       

## 2022-09-19 NOTE — ED Provider Notes (Signed)
[facility] Provider Note    Event Date/Time   First MD Initiated Contact with Patient 09/19/22 [phone]     (approximate)   History   Flank Pain   HPI  Misty Page is a 82 y.o. female with remote history of kidney stones, CAD, hypertension, hyperlipidemia, hypothyroidism who presents to the emergency department with a friend with complaints of right flank pain that started suddenly tonight while watching television.  Has had nausea and vomiting.  No diarrhea.  Complains of some dysuria but no hematuria.  She has had previous history of hysterectomy, renal artery stent.  She states she is not sure if this feels like her previous kidney stones.   History provided by patient, friend.    Past Medical History:  Diagnosis Date   Allergy    Anemia    Arthritis    CA of skin 10/15/2014   Calculus of kidney 10/15/2014   Seen in ER 06/16/10.    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   Coronary artery disease    Diverticulitis    Environmental and seasonal allergies    Fibrocystic breast disease (FCBD)    GERD (gastroesophageal reflux disease)    Heart murmur    High cholesterol    History of heart artery stent    Hyperlipidemia    Hypertension    Hyperthyroidism    Myocardial infarction    Osteomyelitis of left femur 11/30/2018   Osteoporosis    Reactive depression 02/25/2019   Thyroid disease    TIA (transient ischemic attack)     Past Surgical History:  Procedure Laterality Date   ABDOMINAL HYSTERECTOMY  2007   BREAST ENHANCEMENT SURGERY  2004   BREAST IMPLANT REMOVAL     BREAST SURGERY  1984   Fibrocystic Disease   CAROTID ENDARTERECTOMY Left 06/21/2014   Dr. Gilda Crease   CAROTID ENDARTERECTOMY Right 08/02/2014   Dr. Gilda Crease   CAROTID PTA/STENT INTERVENTION N/A 06/15/2017   Procedure: CAROTID PTA/STENT INTERVENTION;  Surgeon: Renford Dills, MD;  Location: [facility] INVASIVE CV LAB;   Service: Cardiovascular;  Laterality: N/A;   COLONOSCOPY WITH PROPOFOL N/A 07/14/2015   Procedure: COLONOSCOPY WITH PROPOFOL;  Surgeon: Wallace Cullens, MD;  Location: [facility] ENDOSCOPY;  Service: Gastroenterology;  Laterality: N/A;   COLONOSCOPY WITH PROPOFOL N/A 02/08/2018   Procedure: COLONOSCOPY WITH PROPOFOL;  Surgeon: Toledo, Boykin Nearing, MD;  Location: [facility] ENDOSCOPY;  Service: Gastroenterology;  Laterality: N/A;   CORONARY ANGIOPLASTY WITH STENT PLACEMENT  1999   ESOPHAGOGASTRODUODENOSCOPY (EGD) WITH PROPOFOL N/A 02/08/2018   Procedure: ESOPHAGOGASTRODUODENOSCOPY (EGD) WITH PROPOFOL;  Surgeon: Toledo, Boykin Nearing, MD;  Location: [facility] ENDOSCOPY;  Service: Gastroenterology;  Laterality: N/A;   FEMORAL ARTERY STENT  08/2003   HERNIA REPAIR  2008   LEG AMPUTATION AT HIP Left 02/28/2019   MASTECTOMY     RENAL ARTERY STENT  08/2003   TRANSUMBILICAL AUGMENTATION MAMMAPLASTY     VASCULAR SURGERY     VISCERAL ARTERY INTERVENTION N/A 02/13/2020   Procedure: VISCERAL ARTERY INTERVENTION;  Surgeon: Annice Needy, MD;  Location: [facility] INVASIVE CV LAB;  Service: Cardiovascular;  Laterality: N/A;    MEDICATIONS:  Prior to Admission medications   Medication Sig Start Date End Date Taking? Authorizing Provider  acetaminophen (TYLENOL) 500 MG tablet Take 500-1,000 mg by mouth every 8 (eight) hours as needed.    [provider]  amLODipine (NORVASC) 5 MG tablet Take  1 tablet (5 mg total) by mouth daily. 07/23/22   Erasmo Downer, MD  Ascorbic Acid (VITAMIN C) 1000 MG tablet TAKE 1 TABLET BY MOUTH EVERY MORNING 06/04/22   [name], Marzella Schlein, MD  Bacillus Coagulans-Inulin (PROBIOTIC-PREBIOTIC) 1-250 BILLION-MG CAPS TAKE 1 CAPSULE BY MOUTH DAILY 09/06/22   Erasmo Downer, MD  BAYER LOW DOSE 81 MG tablet TAKE 1 TABLET BY MOUTH EVERY MORNING 01/28/22   Jacky Kindle, FNP  Calcium Carb-Cholecalciferol (CALCIUM+D3) 600-20 MG-MCG TABS Take 1 tablet by mouth daily. 07/05/22   Erasmo Downer, MD   clopidogrel (PLAVIX) 75 MG tablet TAKE 1 TABLET BY MOUTH DAILY 04/20/22   Erasmo Downer, MD  ezetimibe (ZETIA) 10 MG tablet Take 1 tablet (10 mg total) by mouth daily. 07/23/22   Erasmo Downer, MD  famotidine (PEPCID) 20 MG tablet Take 1 tablet (20 mg total) by mouth 2 (two) times daily. 07/23/22   Erasmo Downer, MD  ferrous sulfate 325 (65 FE) MG tablet Take 1 tablet (325 mg total) by mouth daily with breakfast. 07/27/22   [name], Marzella Schlein, MD  fluticasone (FLONASE) 50 MCG/ACT nasal spray USE 2 SPRAYS EACH NOSTRIL DAILY Patient taking differently: Place 1 spray into both nostrils at bedtime as needed. 08/21/21   Erasmo Downer, MD  losartan (COZAAR) 100 MG tablet TAKE 1 TABLET BY MOUTH DAILY 04/20/22   [name], Marzella Schlein, MD  meclizine (ANTIVERT) 25 MG tablet Take 1 tablet (25 mg total) by mouth 3 (three) times daily as needed for dizziness or nausea. 04/12/21   Sharman Cheek, MD  Mesalamine 800 MG TBEC Take 800 mg by mouth in the morning, at noon, and at bedtime. 09/26/17   [provider]  methimazole (TAPAZOLE) 5 MG tablet Take 5 mg by mouth. 1 Tablet on Mondays, Wednesdays and Fridays and take 1/2 tablet daily the rest of the week. 03/30/17   [provider]  Probiotic Product (PROBIOTIC DAILY PO) Take 1 capsule by mouth daily.    [provider]  rosuvastatin (CRESTOR) 20 MG tablet TAKE ONE-HALF TABLET BY MOUTH DAILY 05/21/22   Erasmo Downer, MD    Physical Exam   Triage Vital Signs: ED Triage Vitals  Enc Vitals Group     BP 09/19/22 0151 (!) 160/64     Pulse Rate 09/19/22 0151 83     Resp 09/19/22 0151 19     Temp 09/19/22 0151 97.7 F (36.5 C)     Temp Source 09/19/22 0151 Oral     SpO2 09/19/22 0151 99 %     Weight 09/19/22 0157 130 lb (59 kg)     Height --      Head Circumference --      Peak Flow --      Pain Score 09/19/22 0157 10     Pain Loc --      Pain Edu? --      Excl. in GC? --     Most recent  vital signs: Vitals:   09/19/22 0600 09/19/22 0630  BP: 111/65 128/69  Pulse: 97 88  Resp:    Temp:    SpO2: 97% 98%    CONSTITUTIONAL: Alert, responds appropriately to questions.  Elderly but appears younger than stated age, conversational, appears uncomfortable HEAD: Normocephalic, atraumatic EYES: Conjunctivae clear, pupils appear equal, sclera nonicteric ENT: normal nose; moist mucous membranes NECK: Supple, normal ROM CARD: RRR; S1 and S2 appreciated RESP: Normal chest excursion without splinting or tachypnea; breath sounds  clear and equal bilaterally; no wheezes, no rhonchi, no rales, no hypoxia or respiratory distress, speaking full sentences ABD/GI: Non-distended; soft, non-tender, no rebound, no guarding, no peritoneal signs BACK: The back appears normal, no CVA tenderness EXT: Normal ROM in all joints; no deformity noted, no edema SKIN: Normal color for age and race; warm; no rash on exposed skin NEURO: Moves all extremities equally, normal speech PSYCH: The patient's mood and manner are appropriate.   ED Results / Procedures / Treatments   LABS: (all labs ordered are listed, but only abnormal results are displayed) Labs Reviewed  BASIC METABOLIC PANEL - Abnormal; Notable for the following components:      Result Value   CO2 18 (*)    Glucose, Bld 157 (*)    All other components within normal limits  CBC - Abnormal; Notable for the following components:   WBC 16.0 (*)    Hemoglobin 9.5 (*)    HCT 32.9 (*)    MCV 76.3 (*)    MCH 22.0 (*)    MCHC 28.9 (*)    RDW 19.5 (*)    Platelets 441 (*)    All other components within normal limits  URINALYSIS, ROUTINE W REFLEX MICROSCOPIC - Abnormal; Notable for the following components:   Color, Urine YELLOW (*)    APPearance HAZY (*)    Hgb urine dipstick LARGE (*)    Ketones, ur 5 (*)    Protein, ur 30 (*)    Leukocytes,Ua SMALL (*)    Bacteria, UA RARE (*)    All other components within normal limits  URINE  CULTURE  HEPATIC FUNCTION PANEL  LIPASE, BLOOD     EKG:   RADIOLOGY: My personal review and interpretation of imaging: Patient has a 3 mm right proximal ureteral stone.  I have personally reviewed all radiology reports.   CT Renal Stone Study  Result Date: 09/19/2022 CLINICAL DATA:  Right flank pain EXAM: CT ABDOMEN AND PELVIS WITHOUT CONTRAST TECHNIQUE: Multidetector CT imaging of the abdomen and pelvis was performed following the standard protocol without IV contrast. RADIATION DOSE REDUCTION: This exam was performed according to the departmental dose-optimization program which includes automated exposure control, adjustment of the mA and/or kV according to patient size and/or use of iterative reconstruction technique. COMPARISON:  None Available. FINDINGS: Lower Chest: 4 mm nodule in the right middle lobe (4:15). Mild lingular atelectasis. Hepatobiliary: Normal hepatic contours. No intra- or extrahepatic biliary dilatation. The gallbladder is normal. Pancreas: Normal pancreas. No ductal dilatation or peripancreatic fluid collection. Spleen: Normal. Adrenals/Urinary Tract: The adrenal glands are normal. There is a 3 mm stone in the proximal right ureter causing mild hydroureteronephrosis. There are multiple nonobstructing stones of the right kidney, the largest measuring 8 mm. Multiple nonobstructing left renal calcifications. 3.5 cm [place] class 1 left renal cyst, no follow-up needed. The urinary bladder is normal for degree of distention Stomach/Bowel: There is no hiatal hernia. Normal duodenal course and caliber. No small bowel dilatation or inflammation. Rectosigmoid diverticulosis without acute inflammation. Normal appendix. Vascular/Lymphatic: There is calcific atherosclerosis of the abdominal aorta. No lymphadenopathy. Reproductive: Normal uterus. No adnexal mass. Other: None. Musculoskeletal: Absent left femur IMPRESSION: 1. A 3 mm stone in the proximal right ureter causing mild  hydroureteronephrosis. 2. Multiple nonobstructing stones of the both kidneys, the largest measuring 8 mm. 3. Right solid pulmonary nodule measuring 4 mm. Per Fleischner Society Guidelines, no routine follow-up imaging is recommended. These guidelines do not apply to immunocompromised patients and patients with  cancer. Follow up in patients with significant comorbidities as clinically warranted. For lung cancer screening, adhere to Lung-RADS guidelines. Reference: Radiology. 2017; 284(1):228-43. 4. Rectosigmoid diverticulosis without acute inflammation. Aortic Atherosclerosis (ICD10-I70.0). Electronically Signed   By: Deatra Robinson M.D.   On: 09/19/2022 03:33     PROCEDURES:  Critical Care performed: No      .1-3 Lead EKG Interpretation  Performed by: Kamariya Blevens, Layla Maw, DO Authorized by: Marquasha Brutus, Layla Maw, DO     Interpretation: normal     ECG rate:  91   ECG rate assessment: normal     Rhythm: sinus rhythm     Ectopy: none     Conduction: normal       IMPRESSION / MDM / ASSESSMENT AND PLAN / ED COURSE  I reviewed the triage vital signs and the nursing notes.    Patient here with flank pain, nausea and vomiting, dysuria.  The patient is on the cardiac monitor to evaluate for evidence of arrhythmia and/or significant heart rate changes.   DIFFERENTIAL DIAGNOSIS (includes but not limited to):   Kidney stone, pyelonephritis, ascending UTI, musculoskeletal back pain   Patient's presentation is most consistent with acute presentation with potential threat to life or bodily function.   PLAN: Will obtain labs, urine, CT renal study.  Will give pain and nausea medicine, IV fluids.   MEDICATIONS GIVEN IN ED: Medications  0.9 %  sodium chloride infusion ( Intravenous New Bag/Given 09/19/22 0607)  fentaNYL (SUBLIMAZE) injection 50 mcg (50 mcg Intravenous Given 09/19/22 0251)  sodium chloride 0.9 % bolus 500 mL (0 mLs Intravenous Stopped 09/19/22 0347)  ondansetron (ZOFRAN) injection 4  mg (4 mg Intravenous Given 09/19/22 0251)  HYDROmorphone (DILAUDID) injection 0.5 mg (0.5 mg Intravenous Given 09/19/22 0348)  cefTRIAXone (ROCEPHIN) 1 g in sodium chloride 0.9 % 100 mL IVPB (0 g Intravenous Stopped 09/19/22 0630)  ondansetron (ZOFRAN) injection 4 mg (4 mg Intravenous Given 09/19/22 0551)  HYDROmorphone (DILAUDID) injection 0.5 mg (0.5 mg Intravenous Given 09/19/22 0551)     ED COURSE: Patient had some relief in pain after fentanyl but pain quickly returned.  Has history of allergy to morphine, codeine.  Will give Dilaudid.  Labs show leukocytosis of 16,000 but no fever and has not had fevers or chills at home.  Normal creatinine.  Urine shows large amount of red blood cells but also small leukocyte esterase, 21-50,000 white blood cells and rare bacteria.  This could be indication of a UTI.  Will give Rocephin.  Appears per her records she has history of anaphylactic reaction to penicillins but has had Rocephin before.  Will add on a urine culture.  CT scan reviewed and interpreted by myself and the radiologist and shows a 3 mm stone with mild right hydroureteronephrosis.  Patient reports pain is improved after Dilaudid but is coming back.  Will give a third round of pain medication.  Recommended admission given recurrent pain requiring IV medication and possible infected kidney stone although not septic or toxic in appearance today.  She is comfortable with this plan.   CONSULTS: Discussed case with Dr. Apolinar Junes with urology who will see in consultation.  Appreciate her help.  She recommends allowing patient to eat and drink as she does not feel the patient needs to be stented at this time given stone is only 3 mm and has a good chance of passing on its own and patient does not meet sepsis criteria and is otherwise well-appearing other than just having pain.  Will keep her n.p.o. after midnight if she is still here in the hospital.    Consulted and discussed patient's case with  hospitalist, Dr. Arville Care.  I have recommended admission and consulting physician agrees and will place admission orders.  Patient (and family if present) agree with this plan.   I reviewed all nursing notes, vitals, pertinent previous records.  All labs, EKGs, imaging ordered have been independently reviewed and interpreted by myself.   OUTSIDE RECORDS REVIEWED: Reviewed last endocrinology note on 06/02/2022 for hyperthyroidism.       FINAL CLINICAL IMPRESSION(S) / ED DIAGNOSES   Final diagnoses:  Kidney stone  Intractable pain  Nausea and vomiting in adult  Leukocytosis, unspecified type     Rx / DC Orders   ED Discharge Orders     None        Note:  This document was prepared using Dragon voice recognition software and may include unintentional dictation errors.   Don Giarrusso, Layla Maw, DO 09/19/22 [phone]

## 2022-09-19 NOTE — H&P (Addendum)
History and Physical    Misty Page YNW:[id number] DOB: April 28, 1941 DOA: 09/19/2022  Referring MD/NP/PA:   PCP: Erasmo Downer, MD   Patient coming from:  The patient is coming from home.    Chief Complaint: Right flank pain  HPI: Misty Page is a 82 y.o. 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  Patient states that her right flank pain started last night, which is constant, severe, unbearable, sharp, nonradiating.  Denies dysuria, burning on urination or hematuria.  She has slightly increased urinary frequency.  She has nausea and vomited few times with nonbilious nonbloody vomiting.  Denies diarrhea or abdominal pain.  No chest pain, cough, shortness of breath.  Data reviewed independently and ED Course: pt was found to have WBC 16.0, positive urinalysis (hazy appearance, small pulm leukocyte, rare bacteria from WBC 21-50, RBC/hpf > 50), GFR> 60.  Temperature normal, blood pressure 124/66, heart rate 80s --> 96-97, RR 19, oxygen saturation 97% on room air.  Patient is admitted to MedSurg bed as inpatient.  Dr. Apolinar Junes of urology is consulted.  CT renal stone protocol: 1. A 3 mm stone in the proximal right ureter causing mild hydroureteronephrosis. 2. Multiple nonobstructing stones of the both kidneys, the largest measuring 8 mm. 3. Right solid pulmonary nodule measuring 4 mm. Per Fleischner Society Guidelines, no routine follow-up imaging is recommended. These guidelines do not apply to immunocompromised patients and patients with cancer. Follow up in patients with significant comorbidities as clinically warranted. For lung cancer screening, adhere to Lung-RADS guidelines. Reference: Radiology. 2017; 284(1):228-43. 4. Rectosigmoid diverticulosis without  acute inflammation.   Aortic Atherosclerosis (ICD10-I70.0).  EKG: I have personally reviewed.  Sinus rhythm, QTc 454, early R wave progression, low voltage   Review of Systems:   General: no fevers, chills, no body weight gain, has poor appetite, has fatigue HEENT: no blurry vision, hearing changes or sore throat Respiratory: no dyspnea, coughing, wheezing CV: no chest pain, no palpitations GI: no nausea, vomiting, abdominal pain, diarrhea, constipation GU: no dysuria, burning on urination, has increased urinary frequency, no hematuria. Ext: no leg edema Neuro: no unilateral weakness, numbness, or tingling, no vision change or hearing loss Skin: no rash, no skin tear. MSK: No muscle spasm, no deformity, no limitation of range of movement in spin. Has right flank pain Heme: No easy bruising.  Travel history: No recent long distant travel.   Allergy:  Allergies  Allergen Reactions   Amoxicillin Anaphylaxis   Naproxen Hives, Shortness Of Breath, Nausea And Vomiting and Swelling   Penicillins Shortness Of Breath, Swelling and Rash    Tolerated ceftriaxone 08/2022   Codeine Nausea And Vomiting   Levofloxacin Nausea And Vomiting   Shellfish Allergy Hives, Diarrhea, Nausea And Vomiting and Swelling    Past Medical History:  Diagnosis Date   Allergy    Anemia    Arthritis    CA of skin 10/15/2014   Calculus of kidney 10/15/2014   Seen in ER 06/16/10.    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   Coronary artery disease    Diverticulitis    Environmental and seasonal allergies    Fibrocystic breast disease (FCBD)    GERD (gastroesophageal reflux disease)    Heart murmur    High  cholesterol    History of heart artery stent    Hyperlipidemia    Hypertension    Hyperthyroidism    Myocardial infarction    Osteomyelitis of left femur 11/30/2018   Osteoporosis    Reactive depression 02/25/2019   Thyroid disease     TIA (transient ischemic attack)     Past Surgical History:  Procedure Laterality Date   ABDOMINAL HYSTERECTOMY  2007   BREAST ENHANCEMENT SURGERY  2004   BREAST IMPLANT REMOVAL     BREAST SURGERY  1984   Fibrocystic Disease   CAROTID ENDARTERECTOMY Left 06/21/2014   Dr. Gilda Crease   CAROTID ENDARTERECTOMY Right 08/02/2014   Dr. Gilda Crease   CAROTID PTA/STENT INTERVENTION N/A 06/15/2017   Procedure: CAROTID PTA/STENT INTERVENTION;  Surgeon: Renford Dills, MD;  Location: [facility] INVASIVE CV LAB;  Service: Cardiovascular;  Laterality: N/A;   COLONOSCOPY WITH PROPOFOL N/A 07/14/2015   Procedure: COLONOSCOPY WITH PROPOFOL;  Surgeon: Wallace Cullens, MD;  Location[facility] ENDOSCOPY;  Service: Gastroenterology;  Laterality: N/A;   COLONOSCOPY WITH PROPOFOL N/A 02/08/2018   Procedure: COLONOSCOPY WITH PROPOFOL;  Surgeon: Toledo, Boykin Nearing, MD;  Location: [facility] ENDOSCOPY;  Service: Gastroenterology;  Laterality: N/A;   CORONARY ANGIOPLASTY WITH STENT PLACEMENT  1999   ESOPHAGOGASTRODUODENOSCOPY (EGD) WITH PROPOFOL N/A 02/08/2018   Procedure: ESOPHAGOGASTRODUODENOSCOPY (EGD) WITH PROPOFOL;  Surgeon: Toledo, Boykin Nearing, MD;  Location: [facility] ENDOSCOPY;  Service: Gastroenterology;  Laterality: N/A;   FEMORAL ARTERY STENT  08/2003   HERNIA REPAIR  2008   LEG AMPUTATION AT HIP Left 02/28/2019   MASTECTOMY     RENAL ARTERY STENT  08/2003   TRANSUMBILICAL AUGMENTATION MAMMAPLASTY     VASCULAR SURGERY     VISCERAL ARTERY INTERVENTION N/A 02/13/2020   Procedure: VISCERAL ARTERY INTERVENTION;  Surgeon: Annice Needy, MD;  Location: [facility] INVASIVE CV LAB;  Service: Cardiovascular;  Laterality: N/A;    Social History:  reports that she quit smoking about 28 years ago. Her smoking use included cigarettes. She has never used smokeless tobacco. She reports that she does not currently use alcohol. She reports that she does not use drugs.  Family History:  Family History  Problem Relation Age of Onset   Hyperlipidemia Mother     Congestive Heart Failure Mother    COPD Mother    Heart disease Mother    Hypertension Mother    Osteoporosis Mother    Pancreatic cancer Father    Arthritis Sister    Alzheimer's disease Sister    Hypertension Sister    Prostate cancer Brother    Heart attack Brother    Stomach cancer Brother    Kidney failure Brother    Leukemia Brother    Emphysema Brother    Pancreatic cancer Brother    Stomach cancer Brother    Breast cancer Paternal Grandmother    Leukemia Paternal Grandfather    Heart attack Maternal Uncle    Stroke Maternal Aunt      Prior to Admission medications   Medication Sig Start Date End Date Taking? Authorizing Provider  acetaminophen (TYLENOL) 500 MG tablet Take 500-1,000 mg by mouth every 8 (eight) hours as needed.    [provider]  amLODipine (NORVASC) 5 MG tablet Take 1 tablet (5 mg total) by mouth daily. 07/23/22   Erasmo Downer, MD  Ascorbic Acid (VITAMIN C) 1000 MG tablet TAKE 1 TABLET BY MOUTH EVERY MORNING 06/04/22   [name], Marzella Schlein, MD  Bacillus Coagulans-Inulin (PROBIOTIC-PREBIOTIC) 1-250 BILLION-MG CAPS TAKE 1  CAPSULE BY MOUTH DAILY 09/06/22   Erasmo Downer, MD  BAYER LOW DOSE 81 MG tablet TAKE 1 TABLET BY MOUTH EVERY MORNING 01/28/22   Jacky Kindle, FNP  Calcium Carb-Cholecalciferol (CALCIUM+D3) 600-20 MG-MCG TABS Take 1 tablet by mouth daily. 07/05/22   Erasmo Downer, MD  clopidogrel (PLAVIX) 75 MG tablet TAKE 1 TABLET BY MOUTH DAILY 04/20/22   Erasmo Downer, MD  ezetimibe (ZETIA) 10 MG tablet Take 1 tablet (10 mg total) by mouth daily. 07/23/22   Erasmo Downer, MD  famotidine (PEPCID) 20 MG tablet Take 1 tablet (20 mg total) by mouth 2 (two) times daily. 07/23/22   Erasmo Downer, MD  ferrous sulfate 325 (65 FE) MG tablet Take 1 tablet (325 mg total) by mouth daily with breakfast. 07/27/22   [name], Marzella Schlein, MD  fluticasone (FLONASE) 50 MCG/ACT nasal spray USE 2 SPRAYS EACH NOSTRIL  DAILY Patient taking differently: Place 1 spray into both nostrils at bedtime as needed. 08/21/21   Erasmo Downer, MD  losartan (COZAAR) 100 MG tablet TAKE 1 TABLET BY MOUTH DAILY 04/20/22   [name], Marzella Schlein, MD  meclizine (ANTIVERT) 25 MG tablet Take 1 tablet (25 mg total) by mouth 3 (three) times daily as needed for dizziness or nausea. 04/12/21   Sharman Cheek, MD  Mesalamine 800 MG TBEC Take 800 mg by mouth in the morning, at noon, and at bedtime. 09/26/17   [provider]  methimazole (TAPAZOLE) 5 MG tablet Take 5 mg by mouth. 1 Tablet on Mondays, Wednesdays and Fridays and take 1/2 tablet daily the rest of the week. 03/30/17   [provider]  Probiotic Product (PROBIOTIC DAILY PO) Take 1 capsule by mouth daily.    [provider]  rosuvastatin (CRESTOR) 20 MG tablet TAKE ONE-HALF TABLET BY MOUTH DAILY 05/21/22   Erasmo Downer, MD    Physical Exam: Vitals:   09/19/22 0600 09/19/22 0630 09/19/22 0700 09/19/22 0820  BP: 111/65 128/69 124/66 133/68  Pulse: 97 88 86 96  Resp:    14  Temp:    98 F (36.7 C)  TempSrc:      SpO2: 97% 98% 98% 96%  Weight:       General: Not in acute distress HEENT:       Eyes: PERRL, EOMI, no scleral icterus.       ENT: No discharge from the ears and nose, no pharynx injection, no tonsillar enlargement.        Neck: No JVD, no bruit, no mass felt. Heme: No neck lymph node enlargement. Cardiac: S1/S2, RRR, No murmurs, No gallops or rubs. Respiratory: No rales, wheezing, rhonchi or rubs. GI: Soft, nondistended, nontender, no rebound pain, no organomegaly, BS present. GG: has positive right CVA tenderness Ext: No pitting leg edema bilaterally. 1+DP/PT pulse bilaterally. Musculoskeletal: No joint deformities, No joint redness or warmth, no limitation of ROM in spin. Skin: No rashes.  Neuro: Alert, oriented X3, cranial nerves II-XII grossly intact, moves all extremities normally. Psych: Patient is not  psychotic, no suicidal or hemocidal ideation.  Labs on Admission: I have personally reviewed following labs and imaging studies  CBC: Recent Labs  Lab 09/19/22 0200  WBC 16.0*  HGB 9.5*  HCT 32.9*  MCV 76.3*  PLT 441*   Basic Metabolic Panel: Recent Labs  Lab 09/19/22 0200  NA 135  K 3.8  CL 103  CO2 18*  GLUCOSE 157*  BUN 16  CREATININE 0.56  CALCIUM 9.0  GFR: Estimated Creatinine Clearance: 43.6 mL/min (by C-G formula based on SCr of 0.56 mg/dL). Liver Function Tests: Recent Labs  Lab 09/19/22 0200  AST 26  ALT 15  ALKPHOS 62  BILITOT 0.3  PROT 7.2  ALBUMIN 4.1   Recent Labs  Lab 09/19/22 0200  LIPASE 37   No results for input(s): "AMMONIA" in the last 168 hours. Coagulation Profile: Recent Labs  Lab 09/19/22 0847  INR 1.1   Cardiac Enzymes: No results for input(s): "CKTOTAL", "CKMB", "CKMBINDEX", "TROPONINI" in the last 168 hours. BNP (last 3 results) Recent Labs    12/16/21 1034  PROBNP 147   HbA1C: No results for input(s): "HGBA1C" in the last 72 hours. CBG: No results for input(s): "GLUCAP" in the last 168 hours. Lipid Profile: No results for input(s): "CHOL", "HDL", "LDLCALC", "TRIG", "CHOLHDL", "LDLDIRECT" in the last 72 hours. Thyroid Function Tests: No results for input(s): "TSH", "T4TOTAL", "FREET4", "T3FREE", "THYROIDAB" in the last 72 hours. Anemia Panel: No results for input(s): "VITAMINB12", "FOLATE", "FERRITIN", "TIBC", "IRON", "RETICCTPCT" in the last 72 hours. Urine analysis:    Component Value Date/Time   COLORURINE YELLOW (A) 09/19/2022 0509   APPEARANCEUR HAZY (A) 09/19/2022 0509   APPEARANCEUR Clear 05/22/2014 2223   LABSPEC 1.011 09/19/2022 0509   LABSPEC 1.024 05/22/2014 2223   PHURINE 7.0 09/19/2022 0509   GLUCOSEU NEGATIVE 09/19/2022 0509   GLUCOSEU Negative 05/22/2014 2223   HGBUR LARGE (A) 09/19/2022 0509   BILIRUBINUR NEGATIVE 09/19/2022 0509   BILIRUBINUR Negative 05/04/2021 1406   BILIRUBINUR Negative  05/22/2014 2223   KETONESUR 5 (A) 09/19/2022 0509   PROTEINUR 30 (A) 09/19/2022 0509   UROBILINOGEN 0.2 05/04/2021 1406   NITRITE NEGATIVE 09/19/2022 0509   LEUKOCYTESUR SMALL (A) 09/19/2022 0509   LEUKOCYTESUR Trace 05/22/2014 2223   Sepsis Labs: @LABRCNTIP (procalcitonin:4,lacticidven:4) )No results found for this or any previous visit (from the past 240 hour(s)).   Radiological Exams on Admission: CT Renal Stone Study  Result Date: 09/19/2022 CLINICAL DATA:  Right flank pain EXAM: CT ABDOMEN AND PELVIS WITHOUT CONTRAST TECHNIQUE: Multidetector CT imaging of the abdomen and pelvis was performed following the standard protocol without IV contrast. RADIATION DOSE REDUCTION: This exam was performed according to the departmental dose-optimization program which includes automated exposure control, adjustment of the mA and/or kV according to patient size and/or use of iterative reconstruction technique. COMPARISON:  None Available. FINDINGS: Lower Chest: 4 mm nodule in the right middle lobe (4:15). Mild lingular atelectasis. Hepatobiliary: Normal hepatic contours. No intra- or extrahepatic biliary dilatation. The gallbladder is normal. Pancreas: Normal pancreas. No ductal dilatation or peripancreatic fluid collection. Spleen: Normal. Adrenals/Urinary Tract: The adrenal glands are normal. There is a 3 mm stone in the proximal right ureter causing mild hydroureteronephrosis. There are multiple nonobstructing stones of the right kidney, the largest measuring 8 mm. Multiple nonobstructing left renal calcifications. 3.5 cm [place] class 1 left renal cyst, no follow-up needed. The urinary bladder is normal for degree of distention Stomach/Bowel: There is no hiatal hernia. Normal duodenal course and caliber. No small bowel dilatation or inflammation. Rectosigmoid diverticulosis without acute inflammation. Normal appendix. Vascular/Lymphatic: There is calcific atherosclerosis of the abdominal aorta. No  lymphadenopathy. Reproductive: Normal uterus. No adnexal mass. Other: None. Musculoskeletal: Absent left femur IMPRESSION: 1. A 3 mm stone in the proximal right ureter causing mild hydroureteronephrosis. 2. Multiple nonobstructing stones of the both kidneys, the largest measuring 8 mm. 3. Right solid pulmonary nodule measuring 4 mm. Per Fleischner Society Guidelines, no routine follow-up imaging is  recommended. These guidelines do not apply to immunocompromised patients and patients with cancer. Follow up in patients with significant comorbidities as clinically warranted. For lung cancer screening, adhere to Lung-RADS guidelines. Reference: Radiology. 2017; 284(1):228-43. 4. Rectosigmoid diverticulosis without acute inflammation. Aortic Atherosclerosis (ICD10-I70.0). Electronically Signed   By: Deatra Robinson M.D.   On: 09/19/2022 03:33      Assessment/Plan Principal Problem:   Right ureteral stone Active Problems:   Hydroureteronephrosis   Severe sepsis   CAD (coronary artery disease)   Essential (primary) hypertension   Hypercholesteremia   Hyperthyroidism   Ulcerative colitis   History of TIA (transient ischemic attack)   Iron deficiency anemia   Lung nodule   Assessment and Plan:  Right ureteral stone with infection, and hydroureteronephros and severe sepsis: UA is positive for infection. Pt meets criteria for severe sepsis with WBC 16.0, heart rate 96-97.  Lactic acid is trending up 1.5 --> 3.5. Procalcitonin<0.01.  Hemodynamically stable.  Consulted Dr. Apolinar Junes of urology.  Since ureteral stone is small, 3 mm, and there is high possibility that the patient may pass stone spontaneously.,  No urgent procedure needed now.  -will admitted to MedSurg bed versus inpatient -IV Rocephin -Follow-up urine culture -Pain control: As needed Percocet, Dilaudid, Tylenol -IV fluid: 1.5 normal saline, then 75 cc/h -trend lactic acid levels -Start Flomax  Addendum: Bp dropped to 81/50 per RN's  report at 5: 10PM. I went to have checked on pt, and measured Bp which is 91/59 (MAP 70). Her mental status is normal, heart rate 94, RR 16. -will give another 1L of NS bolus -will give solucortef 100 mg -will increased NS from 75 to 125 cc/h -updated Dr. Apolinar Junes of urology -watch respiratory status closely to avoid fluid overload. -If Bp cannot be maintained, will need to start vasopressor and transfer to ICU. -change diet to NPO now in case pt needs procedure -check CBG q4h and prn D50 -will d/c dilaudid   CAD (coronary artery disease) -Aspirin -Hold Plavix in case patient need procedure -Crestor  Essential (primary) hypertension -will hold Amlodipine, Cozaar since pt is at risk of develop hypotension due to sepsis -IV hydralazine as needed  Hypercholesteremia -Crestor, Zetia  Hyperthyroidism -Methimazole  Ulcerative colitis -Continue home mesalamine  History of TIA (transient ischemic attack) -Aspirin, Crestor -Hold Plavix  Iron deficiency anemia: Hemoglobin stable 9.5 (7.6 on 07/23/2022) -Continue home iron supplement  Lung nodule: This is incidental finding by CT scan. -Follow-up with PCP as outpatient      DVT ppx: SCD  Code Status: DNR per pt  Family Communication:   Yes, patient's daughter and son-in-law at bed side.    Disposition Plan:  Anticipate discharge back to previous environment  Consults called: Dr. Apolinar Junes of urology  Admission status and Level of care: Med-Surg:  as inpt      Dispo: The patient is from: Home              Anticipated d/c is to: Home              Anticipated d/c date is: 2 days              Patient currently is not medically stable to d/c.    Severity of Illness:  The appropriate patient status for this patient is INPATIENT. Inpatient status is judged to be reasonable and necessary in order to provide the required intensity of service to ensure the patient's safety. The patient's presenting symptoms, physical exam  findings, and initial  radiographic and laboratory data in the context of their chronic comorbidities is felt to place them at high risk for further clinical deterioration. Furthermore, it is not anticipated that the patient will be medically stable for discharge from the hospital within 2 midnights of admission.   * I certify that at the point of admission it is my clinical judgment that the patient will require inpatient hospital care spanning beyond 2 midnights from the point of admission due to high intensity of service, high risk for further deterioration and high frequency of surveillance required.*       Date of Service 09/19/2022    Lorretta Harp [facility]   If 7PM-7AM, please contact night-coverage [email or web address] 09/19/2022, 12:30 PM

## 2022-09-19 NOTE — ED Notes (Signed)
Called floor to see if room assignment is ready.

## 2022-09-19 NOTE — ED Triage Notes (Signed)
Patient C/O right flank pain and nausea that began about two hours ago. Patient has a very remote history of kidney stones and is unable to tell if this pain is similar. She denies any urinary symptoms at this time.

## 2022-09-19 NOTE — Progress Notes (Signed)
   09/19/22 1710  Vitals  BP (!) 81/50  MAP (mmHg) (!) 61  BP Location Right Arm  BP Method Automatic  Patient Position (if appropriate) Lying  Pulse Rate 94  Pulse Rate Source Monitor  Resp 16   Notified Dr. Clyde Lundborg, of BP. Provider came to bedside to evaluate patient. Order Bolus and increased IV fluids to . Patient also placed on 1L [place]. Daughter at bedside updated on patient's status.

## 2022-09-20 ENCOUNTER — Encounter
Admission: EM | Disposition: A | Discharge status: Home / Self Care | Payer: Self-pay | Source: Home / Self Care | Attending: Student

## 2022-09-20 ENCOUNTER — Inpatient Hospital Stay: Payer: Medicare HMO | Admitting: Certified Registered"

## 2022-09-20 ENCOUNTER — Inpatient Hospital Stay: Payer: Medicare HMO

## 2022-09-20 ENCOUNTER — Encounter: Payer: Self-pay | Admitting: Urology

## 2022-09-20 ENCOUNTER — Inpatient Hospital Stay
Admit: 2022-09-20 | Discharge: 2022-09-20 | Disposition: A | Discharge status: Home / Self Care | Payer: Medicare HMO | Attending: Student | Admitting: Student

## 2022-09-20 DIAGNOSIS — N201 Calculus of ureter: Secondary | ICD-10-CM | POA: Diagnosis not present

## 2022-09-20 DIAGNOSIS — N390 Urinary tract infection, site not specified: Secondary | ICD-10-CM

## 2022-09-20 DIAGNOSIS — R6510 Systemic inflammatory response syndrome (SIRS) of non-infectious origin without acute organ dysfunction: Secondary | ICD-10-CM | POA: Diagnosis not present

## 2022-09-20 HISTORY — PX: CYSTOSCOPY WITH STENT PLACEMENT: SHX5790

## 2022-09-20 LAB — URINE CULTURE: Culture: >=100000 — AB

## 2022-09-20 LAB — PHOSPHORUS: Phosphorus: 2.5 mg/dL (ref 2.5–4.6)

## 2022-09-20 LAB — ECHOCARDIOGRAM COMPLETE: Height: 62 in

## 2022-09-20 LAB — BASIC METABOLIC PANEL
Anion gap: 6 (ref 5–15)
BUN: 18 mg/dL (ref 8–23)
CO2: 20 mmol/L — ABNORMAL LOW (ref 22–32)
Calcium: 7.2 mg/dL — ABNORMAL LOW (ref 8.9–10.3)
Chloride: 109 mmol/L (ref 98–111)
Creatinine, Ser: 0.95 mg/dL (ref 0.44–1.00)
GFR, Estimated: >60 mL/min (ref >60)
Glucose, Bld: 124 mg/dL — ABNORMAL HIGH (ref 70–99)
Potassium: 4 mmol/L (ref 3.5–5.1)
Sodium: 135 mmol/L (ref 135–145)

## 2022-09-20 LAB — VITAMIN B12: Vitamin B-12: 465 pg/mL (ref 180–914)

## 2022-09-20 LAB — CBC
HCT: 26.9 % — ABNORMAL LOW (ref 36.0–46.0)
Hemoglobin: 7.8 g/dL — ABNORMAL LOW (ref 12.0–15.0)
MCH: 22.5 pg — ABNORMAL LOW (ref 26.0–34.0)
MCHC: 29 g/dL — ABNORMAL LOW (ref 30.0–36.0)
MCV: 77.7 fL — ABNORMAL LOW (ref 80.0–100.0)
Platelets: 310 10*3/uL (ref 150–400)
RBC: 3.46 MIL/uL — ABNORMAL LOW (ref 3.87–5.11)
RDW: 19.8 % — ABNORMAL HIGH (ref 11.5–15.5)
WBC: 21.6 10*3/uL — ABNORMAL HIGH (ref 4.0–10.5)
nRBC: 0 % (ref 0.0–0.2)

## 2022-09-20 LAB — FOLATE: Folate: 16.5 ng/mL (ref >5.9)

## 2022-09-20 LAB — GLUCOSE, CAPILLARY
Glucose-Capillary: 103 mg/dL — ABNORMAL HIGH (ref 70–99)
Glucose-Capillary: 119 mg/dL — ABNORMAL HIGH (ref 70–99)

## 2022-09-20 LAB — MRSA NEXT GEN BY PCR, NASAL: MRSA by PCR Next Gen: NOT DETECTED

## 2022-09-20 LAB — VITAMIN D 25 HYDROXY (VIT D DEFICIENCY, FRACTURES): Vit D, 25-Hydroxy: 30.7 ng/mL (ref 30–100)

## 2022-09-20 LAB — MAGNESIUM: Magnesium: 1.6 mg/dL — ABNORMAL LOW (ref 1.7–2.4)

## 2022-09-20 SURGERY — CYSTOSCOPY, WITH STENT INSERTION
Anesthesia: General | Laterality: Right

## 2022-09-20 MED ORDER — LIDOCAINE HCL (CARDIAC) PF 100 MG/5ML IV SOSY
PREFILLED_SYRINGE | INTRAVENOUS | Status: DC | PRN
  Administered 2022-09-20: 80 mg via INTRAVENOUS

## 2022-09-20 MED ORDER — MAGNESIUM SULFATE 2 GM/50ML IV SOLN
2.0000 g | Freq: Once | INTRAVENOUS | Status: AC
  Administered 2022-09-20: 2 g via INTRAVENOUS
  Filled 2022-09-20: qty 50

## 2022-09-20 MED ORDER — POLYSACCHARIDE IRON COMPLEX 150 MG PO CAPS
150.0000 mg | ORAL_CAPSULE | Freq: Every day | ORAL | Status: DC
  Administered 2022-09-20 – 2022-09-23 (×4): 150 mg via ORAL
  Filled 2022-09-20 (×4): qty 1

## 2022-09-20 MED ORDER — OXYCODONE HCL 5 MG PO TABS: 5.0000 mg | ORAL_TABLET | Freq: Once | ORAL | Status: DC | PRN

## 2022-09-20 MED ORDER — IPRATROPIUM-ALBUTEROL 0.5-2.5 (3) MG/3ML IN SOLN
RESPIRATORY_TRACT | Status: AC
  Filled 2022-09-20: qty 3

## 2022-09-20 MED ORDER — FENTANYL CITRATE (PF) 100 MCG/2ML IJ SOLN
INTRAMUSCULAR | Status: DC | PRN
  Administered 2022-09-20: 50 ug via INTRAVENOUS

## 2022-09-20 MED ORDER — SODIUM CHLORIDE 0.9 % IR SOLN
Status: DC | PRN
  Administered 2022-09-20: 3000 mL

## 2022-09-20 MED ORDER — MELATONIN 5 MG PO TABS
5.0000 mg | ORAL_TABLET | Freq: Once | ORAL | Status: AC
  Administered 2022-09-21: 5 mg via ORAL
  Filled 2022-09-20: qty 1

## 2022-09-20 MED ORDER — IOHEXOL 180 MG/ML  SOLN
INTRAMUSCULAR | Status: DC | PRN
  Administered 2022-09-20: 10 mL

## 2022-09-20 MED ORDER — FENTANYL CITRATE (PF) 100 MCG/2ML IJ SOLN
INTRAMUSCULAR | Status: AC
  Filled 2022-09-20: qty 2

## 2022-09-20 MED ORDER — IPRATROPIUM-ALBUTEROL 0.5-2.5 (3) MG/3ML IN SOLN: 3.0000 mL | Freq: Four times a day (QID) | RESPIRATORY_TRACT | Status: DC | PRN

## 2022-09-20 MED ORDER — PHENYLEPHRINE 80 MCG/ML (10ML) SYRINGE FOR IV PUSH (FOR BLOOD PRESSURE SUPPORT)
PREFILLED_SYRINGE | INTRAVENOUS | Status: DC | PRN
  Administered 2022-09-20: 120 ug via INTRAVENOUS

## 2022-09-20 MED ORDER — ONDANSETRON HCL 4 MG/2ML IJ SOLN
INTRAMUSCULAR | Status: DC | PRN
  Administered 2022-09-20: 4 mg via INTRAVENOUS

## 2022-09-20 MED ORDER — FUROSEMIDE 10 MG/ML IJ SOLN
40.0000 mg | Freq: Once | INTRAMUSCULAR | Status: AC
  Administered 2022-09-20: 40 mg via INTRAVENOUS
  Filled 2022-09-20: qty 4

## 2022-09-20 MED ORDER — FUROSEMIDE 10 MG/ML IJ SOLN
40.0000 mg | Freq: Two times a day (BID) | INTRAMUSCULAR | Status: DC
  Administered 2022-09-21: 40 mg via INTRAVENOUS
  Filled 2022-09-20: qty 4

## 2022-09-20 MED ORDER — PROPOFOL 10 MG/ML IV BOLUS
INTRAVENOUS | Status: AC
  Filled 2022-09-20: qty 20

## 2022-09-20 MED ORDER — FENTANYL CITRATE (PF) 100 MCG/2ML IJ SOLN: 25.0000 ug | INTRAMUSCULAR | Status: DC | PRN

## 2022-09-20 MED ORDER — IPRATROPIUM-ALBUTEROL 0.5-2.5 (3) MG/3ML IN SOLN
3.0000 mL | Freq: Once | RESPIRATORY_TRACT | Status: AC
  Administered 2022-09-20: 3 mL via RESPIRATORY_TRACT

## 2022-09-20 MED ORDER — DEXAMETHASONE SODIUM PHOSPHATE 10 MG/ML IJ SOLN
INTRAMUSCULAR | Status: DC | PRN
  Administered 2022-09-20: 10 mg via INTRAVENOUS

## 2022-09-20 MED ORDER — LACTATED RINGERS IV SOLN: INTRAVENOUS | Status: DC

## 2022-09-20 MED ORDER — PROPOFOL 10 MG/ML IV BOLUS
INTRAVENOUS | Status: DC | PRN
  Administered 2022-09-20: 120 mg via INTRAVENOUS

## 2022-09-20 MED ORDER — OXYCODONE HCL 5 MG/5ML PO SOLN: 5.0000 mg | Freq: Once | ORAL | Status: DC | PRN

## 2022-09-20 MED ORDER — ONDANSETRON HCL 4 MG/2ML IJ SOLN: 4.0000 mg | Freq: Once | INTRAMUSCULAR | Status: DC | PRN

## 2022-09-20 MED ORDER — ACETAMINOPHEN 10 MG/ML IV SOLN: 1000.0000 mg | Freq: Once | INTRAVENOUS | Status: DC | PRN

## 2022-09-20 SURGICAL SUPPLY — 21 items
BAG DRAIN SIEMENS DORNER NS (MISCELLANEOUS) ×2 IMPLANT
BAG DRN NS LF (MISCELLANEOUS) ×1
BRUSH SCRUB EZ 1% IODOPHOR (MISCELLANEOUS) ×2 IMPLANT
CATH URETL OPEN 5X70 (CATHETERS) ×2 IMPLANT
GAUZE 4X4 16PLY ~~LOC~~+RFID DBL (SPONGE) ×4 IMPLANT
GLOVE BIO SURGEON STRL SZ 6.5 (GLOVE) ×2 IMPLANT
GOWN STRL REUS W/ TWL LRG LVL3 (GOWN DISPOSABLE) ×4 IMPLANT
GOWN STRL REUS W/TWL LRG LVL3 (GOWN DISPOSABLE) ×2
GUIDEWIRE STR DUAL SENSOR (WIRE) ×2 IMPLANT
IV NS IRRIG 3000ML ARTHROMATIC (IV SOLUTION) ×2 IMPLANT
KIT TURNOVER CYSTO (KITS) ×2 IMPLANT
PACK CYSTO AR (MISCELLANEOUS) ×2 IMPLANT
SET CYSTO W/LG BORE CLAMP LF (SET/KITS/TRAYS/PACK) ×2 IMPLANT
STENT URET 6FRX24 CONTOUR (STENTS) IMPLANT
STENT URET 6FRX26 CONTOUR (STENTS) IMPLANT
SURGILUBE 2OZ TUBE FLIPTOP (MISCELLANEOUS) ×2 IMPLANT
SYR TOOMEY IRRIG 70ML (MISCELLANEOUS) ×1
SYRINGE TOOMEY IRRIG 70ML (MISCELLANEOUS) ×2 IMPLANT
TRAP FLUID SMOKE EVACUATOR (MISCELLANEOUS) ×2 IMPLANT
WATER STERILE IRR 1000ML POUR (IV SOLUTION) ×2 IMPLANT
WATER STERILE IRR 500ML POUR (IV SOLUTION) ×2 IMPLANT

## 2022-09-20 NOTE — Transfer of Care (Signed)
Immediate Anesthesia Transfer of Care Note  Patient: Misty Page  Procedure(s) Performed: CYSTOSCOPY WITH STENT PLACEMENT (Right)  Patient Location: PACU  Anesthesia Type:General  Level of Consciousness: drowsy  Airway & Oxygen Therapy: Patient Spontanous Breathing and Patient connected to face mask oxygen  Post-op Assessment: Report given to RN  Post vital signs: stable  Last Vitals:  Vitals Value Taken Time  BP 99/59 09/20/22 0722  Temp    Pulse 104 09/20/22 0725  Resp 20 09/20/22 0725  SpO2 99 % 09/20/22 0725  Vitals shown include unvalidated device data.  Last Pain:  Vitals:   09/20/22 0609  TempSrc:   PainSc: 4          Complications: No notable events documented.

## 2022-09-20 NOTE — Progress Notes (Addendum)
Mobility Specialist - Progress Note   09/20/22 1100  Mobility  Activity Stood at bedside  Level of Assistance Minimal assist, patient does 75% or more  Assistive Device Front wheel walker  Range of Motion/Exercises Active  Activity Response Tolerated well  $Mobility charge 1 Mobility    Post-mobility: 111 HR, 98% SpO2   Pt lying in bed upon arrival, utilizing 2L. Pt requesting assistance for OOB ADLs. Pt completed bed mobility modI with extra time. Pt removed [place] from nose. STS with minA. Assistance to don personal brief and change gown. Noted wheezing only with activity, once seated O2 donned and sats in high 90s. Pt left in bed with alarm set, needs in reach. Family at bedside.    Filiberto Pinks Mobility Specialist 09/20/22, 11:40 AM

## 2022-09-20 NOTE — Progress Notes (Signed)
[facility] Progress Note  Patient: Misty Page    ZOX:[id number]  DOA: 09/19/2022     Date of Service: the patient was seen and examined on 09/20/2022  Chief Complaint  Patient presents with   Flank Pain   Brief hospital course: Misty Page is a 82 y.o. 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  CT renal stone protocol: 1. A 3 mm stone in the proximal right ureter causing mild hydroureteronephrosis. 2. Multiple nonobstructing stones of the both kidneys, the largest measuring 8 mm. Urology was consulted and patient was admitted for further management as below.  Assessment and Plan: Right ureteral stone with infection, and hydroureteronephros and severe sepsis:  BP dropped, patient was given IV fluid bolus, Solu-Cortef. Continue ceftriaxone Urine consulted s/p Right retrograde pyelogram and Right ureteral stent placement Recommended to follow in 2 weeks for staged ureteroscopic intervention, follow-up will be arranged by urology. Follow urine culture and blood culture   Acute hypoxic respiratory failure most likely due to pulmonary edema CXR: Bronchitic changes with scattered interstitial infiltrates bilaterally question pulmonary edema versus atypical infection. Abnormal fullness of LEFT pulmonary hilum, adenopathy/mass not excluded; either CT chest with contrast evaluation or radiographic follow-up until resolution recommended to exclude hilar adenopathy. Discontinued IV fluid Started Lasix 40 mg IV twice daily DuoNeb every 6 hourly as needed Wean oxygen gradually as per improvement    CAD (coronary artery disease) -Aspirin -Hold Plavix in case patient need procedure -Crestor Murmur audible, follow 2D echocardiogram   Essential (primary)  hypertension -continue to hold Amlodipine and  Cozaar  Pt developed hypotension, s/p IV fluid bolus given Continue to monitor BP and titrate medications accordingly -IV hydralazine as needed   Hypercholesteremia -Crestor, Zetia   Hyperthyroidism -Methimazole   Ulcerative colitis -Continue home mesalamine   History of TIA (transient ischemic attack) -Aspirin, Crestor -Hold Plavix, resume when cleared by urology    Iron deficiency anemia: Hemoglobin stable 9.5 (7.6 on 07/23/2022) -Continue home iron supplement   Lung nodule: This is incidental finding by CT scan. -Follow-up with PCP as outpatient   Hypomagnesemia, mag repleted. Monitor electrolytes and replete as needed.   Anemia of chronic disease and iron deficiency, transferrin saturation 2% hemoglobin 7.8 Folate within normal range, B12 within normal range Started oral iron supplement, avoided IV iron due to possible UTI Follow with PCP to repeat iron profile after 3 months  Body mass index is 25.4 kg/m.  Interventions:      Diet: Heart Healthy diet DVT Prophylaxis: SCD, pharmacological prophylaxis contraindicated due to risk of bleeding    Advance goals of care discussion: DNR  Family Communication: family was present at bedside, at the time of interview.  The pt provided permission to discuss medical plan with the family. Opportunity was given to ask question and all questions were answered satisfactorily.   Disposition:  Pt is from Home, admitted with Abd pain, found to have ureteral stone with obstruction, status post cystoscopy and stent insertion, developed respiratory failure, shortness of breath, which precludes a safe discharge. Discharge to Home, when clinically stable, may need few days to improve.  Subjective: No significant events overnight, patient was admitted with right abdominal pain, patient was seen after cystoscopy, developed shortness of breath and using oxygen 2 L via nasal cannula.  Denies  any chest pain or palpitations, no  any other complaints.  Physical Exam: General: NAD, lying comfortably Appear in no distress, affect appropriate Eyes: PERRLA ENT: Oral Mucosa Clear, moist  Neck: no JVD,  Cardiovascular: S1 and S2 Present, Murmur audible,  Respiratory: Good air entry bilaterally, bilateral crackles, no wheezing appreciated. Abdomen: Bowel Sound present, Soft and no tenderness,  Skin: no rashes Extremities: no Pedal edema, no calf tenderness Neurologic: without any new focal findings Gait not checked due to patient safety concerns  Vitals:   09/20/22 0755 09/20/22 0800 09/20/22 0815 09/20/22 1029  BP:  127/82 127/66 (!) 103/48  Pulse: (!) 109 (!) 102 88 93  Resp: Temp:   98.6 F (37 C) 97.6 F (36.4 C)  TempSrc:      SpO2: (!) 88% 97% 99% 96%  Weight:      Height:        Intake/Output Summary (Last 24 hours) at 09/20/2022 1550 Last data filed at 09/20/2022 1610 Gross per 24 hour  Intake 2099.85 ml  Output 550 ml  Net 1549.85 ml   Filed Weights   09/19/22 0157 09/19/22 2200  Weight: 59 kg 63 kg    Data Reviewed: I have personally reviewed and interpreted daily labs, tele strips, imagings as discussed above. I reviewed all nursing notes, pharmacy notes, vitals, pertinent old records I have discussed plan of care as described above with RN and patient/family.  CBC: Recent Labs  Lab 09/19/22 0200 09/20/22 0415  WBC 16.0* 21.6*  HGB 9.5* 7.8*  HCT 32.9* 26.9*  MCV 76.3* 77.7*  PLT 441* 310   Basic Metabolic Panel: Recent Labs  Lab 09/19/22 0200 09/20/22 0415 09/20/22 0912  NA 135 135  --   K 3.8 4.0  --   CL 103 109  --   CO2 18* 20*  --   GLUCOSE 157* 124*  --   BUN 16 18  --   CREATININE 0.56 0.95  --   CALCIUM 9.0 7.2*  --   MG  --   --  1.6*  PHOS  --   --  2.5    Studies: DG Chest Port 1 View  Result Date: 09/20/2022 CLINICAL DATA:  Shortness of breath EXAM: PORTABLE CHEST 1 VIEW COMPARISON:  Portable exam 1242  hours compared to 12/16/2021 FINDINGS: Normal heart size, mediastinal contours, and pulmonary vascularity. Abnormal fullness of LEFT pulmonary hilum. Atherosclerotic calcification aorta. Peribronchial thickening with scattered interstitial infiltrates bilaterally which could represent pulmonary edema or atypical infection. Probable small LEFT pleural effusion. No pneumothorax or acute osseous findings. Diffuse osseous demineralization. IMPRESSION: Bronchitic changes with scattered interstitial infiltrates bilaterally question pulmonary edema versus atypical infection. Abnormal fullness of LEFT pulmonary hilum, adenopathy/mass not excluded; either CT chest with contrast evaluation or radiographic follow-up until resolution recommended to exclude hilar adenopathy. Electronically Signed   By: Ulyses Southward M.D.   On: 09/20/2022 12:57   DG OR UROLOGY CYSTO IMAGE ([facility] ONLY)  Result Date: 09/20/2022 There is no interpretation for this exam.  This order is for images obtained during a surgical procedure.  Please See "Surgeries" Tab for more information regarding the procedure.   DG Abd 1 View  Result Date: 09/19/2022 CLINICAL DATA:  Kidney stone EXAM: ABDOMEN - 1 VIEW COMPARISON:  X-ray 07/21/2009.  CT scan 09/19/2022 without contrast FINDINGS: Gas seen in nondilated loops of small and large bowel with scattered colonic stool. Gas and stool are seen overlying the reniform shadows. There is calcification overlying the upper pole of the right  kidney consistent known renal stone as seen on CT. Other tiny foci are not seen in their entirety on this x-ray. The stone in the proximal right ureter at the level of the L3 by CT is not clearly seen on the current x-ray but there again is significant overlapping bowel gas and stool in this location scattered vascular calcifications are identified as well. IMPRESSION: Renal stones are again seen as on prior CT scan. The stone in the proximal right ureter is not clearly seen on  this x-ray with overlapping bowel gas and soft tissue. Nonspecific bowel gas pattern. Electronically Signed   By: Karen Kays M.D.   On: 09/19/2022 20:54    Scheduled Meds:  acidophilus  1 capsule Oral Daily   vitamin C  1,000 mg Oral q morning   aspirin EC  81 mg Oral q morning   calcium-vitamin D  1 tablet Oral Q breakfast   ezetimibe  10 mg Oral Daily   famotidine  20 mg Oral BID   ferrous sulfate  325 mg Oral Q breakfast   iron polysaccharides  150 mg Oral Daily   Mesalamine  800 mg Oral TID   methIMAzole  2.5 mg Oral Once per day on Sun Tue Thu Sat   methimazole  5 mg Oral Once per day on Mon Wed Fri   rosuvastatin  10 mg Oral Daily   tamsulosin  0.4 mg Oral Daily   Continuous Infusions:  ciprofloxacin 400 mg (09/20/22 1340)   magnesium sulfate bolus IVPB 2 g (09/20/22 1527)   PRN Meds: acetaminophen, dextrose, fluticasone, ipratropium-albuterol, meclizine, ondansetron (ZOFRAN) IV, oxyCODONE-acetaminophen  Time spent: 35 minutes  Author: Gillis Santa. MD [facility] 09/20/2022 3:50 PM  To reach On-call, see care teams to locate the attending and reach out to them via [email or web address]. If 7PM-7AM, please contact night-coverage If you still have difficulty reaching the attending provider, please page the [facility] (Director on Call) for [facility] on amion for assistance.

## 2022-09-20 NOTE — Anesthesia Procedure Notes (Signed)
Procedure Name: LMA Insertion Date/Time: 09/20/2022 7:05 AM  Performed by: Katherine Basset, CRNAPre-anesthesia Checklist: Patient identified, Emergency Drugs available, Suction available and Patient being monitored Patient Re-evaluated:Patient Re-evaluated prior to induction Oxygen Delivery Method: Circle system utilized Preoxygenation: Pre-oxygenation with 100% oxygen Induction Type: IV induction LMA: LMA inserted LMA Size: 4.0 Number of attempts: 1 Placement Confirmation: positive ETCO2 and breath sounds checked- equal and bilateral Tube secured with: Tape Dental Injury: Teeth and Oropharynx as per pre-operative assessment  Comments: Igel 4 utilized w/o difficulty

## 2022-09-20 NOTE — Interval H&P Note (Signed)
History and Physical Interval Note:  09/20/2022 7:01 AM  Misty Page  has presented today for surgery, with the diagnosis of stent placement.  The various methods of treatment have been discussed with the patient and family. After consideration of risks, benefits and other options for treatment, the patient has consented to  Procedure(s): CYSTOSCOPY WITH STENT PLACEMENT (Right) as a surgical intervention.  The patient's history has been reviewed, patient examined, no change in status, stable for surgery.  I have reviewed the patient's chart and labs.  Questions were answered to the patient's satisfaction.    Patient was seen and examined today in the preoperative holding area accompanied by her daughter Alert and oriented no acute distress Regular rate and rhythm Clear to auscultation bilaterally Right CVA tenderness appreciated No lower extremity edema Abdomen otherwise soft nontender  Labs trending in a worse direction this morning, WBC 21, creatinine also now rising  Agree with proceeding with urgent right ureteral stent this a.m.  All questions answered.  Antibiotics have been broadened.  [name]

## 2022-09-20 NOTE — Anesthesia Preprocedure Evaluation (Addendum)
Anesthesia Evaluation  Patient identified by MRN, date of birth, ID band Patient awake    Reviewed: Allergy & Precautions, NPO status , Patient's Chart, lab work & pertinent test results  History of Anesthesia Complications (+) PONV and history of anesthetic complications  Airway Mallampati: IV   Neck ROM: Full    Dental   Bridges :   Pulmonary former smoker (quit 1995)   Pulmonary exam normal breath sounds clear to auscultation       Cardiovascular hypertension, + CAD (s/p MI and stents on Plavix) and + Peripheral Vascular Disease (s/p CEA)  Normal cardiovascular exam Rhythm:Regular Rate:Normal  ECG 09/19/22:  Sinus tachycardia Nonspecific ST abnormality Abnormal ECG When compared with ECG of 12-Apr-2021 14:44, No significant change was found   Neuro/Psych TIA   GI/Hepatic PUD,GERD  ,,  Endo/Other    Renal/GU Renal disease (CKD)     Musculoskeletal   Abdominal   Peds  Hematology  (+) Blood dyscrasia, anemia   Anesthesia Other Findings   Reproductive/Obstetrics                             Anesthesia Physical Anesthesia Plan  ASA: 3  Anesthesia Plan: General   Post-op Pain Management:    Induction: Intravenous  PONV Risk Score and Plan: 4 or greater and Ondansetron, Dexamethasone and Treatment may vary due to age or medical condition  Airway Management Planned: LMA  Additional Equipment:   Intra-op Plan:   Post-operative Plan: Extubation in OR  Informed Consent: I have reviewed the patients History and Physical, chart, labs and discussed the procedure including the risks, benefits and alternatives for the proposed anesthesia with the patient or authorized representative who has indicated his/her understanding and acceptance.   Patient has DNR.  Discussed DNR with patient and Continue DNR.   Dental advisory given  Plan Discussed with: CRNA  Anesthesia Plan Comments:  (Patient consented for risks of anesthesia including but not limited to:  - adverse reactions to medications - damage to eyes, teeth, lips or other oral mucosa - nerve damage due to positioning  - sore throat or hoarseness - damage to heart, brain, nerves, lungs, other parts of body or loss of life  Informed patient about role of CRNA in peri- and intra-operative care.  Patient voiced understanding.)        Anesthesia Quick Evaluation

## 2022-09-20 NOTE — Anesthesia Postprocedure Evaluation (Signed)
Anesthesia Post Note  Patient: Misty Page  Procedure(s) Performed: CYSTOSCOPY WITH STENT PLACEMENT (Right)  Patient location during evaluation: PACU Anesthesia Type: General Level of consciousness: awake and alert, oriented and patient cooperative Pain management: pain level controlled Vital Signs Assessment: post-procedure vital signs reviewed and stable Respiratory status: spontaneous breathing, nonlabored ventilation and respiratory function stable Cardiovascular status: blood pressure returned to baseline and stable Postop Assessment: adequate PO intake Anesthetic complications: no   No notable events documented.   Last Vitals:  Vitals:   09/20/22 0800 09/20/22 0815  BP: 127/82 127/66  Pulse: (!) 102 88  Resp: 20 18  Temp:  37 C  SpO2: 97% 99%    Last Pain:  Vitals:   09/20/22 0815  TempSrc:   PainSc: 0-No pain                 Reed Breech

## 2022-09-20 NOTE — Op Note (Signed)
Date of procedure: 09/20/22  Preoperative diagnosis:  Right proximal ureteral calculus Urinary tract infection/SIRS criteria  Postoperative diagnosis:  Same as above  Procedure: Right retrograde pyelogram Right ureteral stent placement Interpretation of fluoroscopy less than 30 minutes  Surgeon: [name], MD  Anesthesia: General  Complications: None  Intraoperative findings: 7 mm right proximal ureteral stone and worsening clinical status including worsening leukocytosis, hypotension, and low-grade temps concerning for evolving sepsis.  Cystitis cystica and inflammatory bladder changes appreciated.  Moderate amount of debris at the time of stent placement.  EBL: Minimal  Specimens: None  Drains: 6 x 24 French double-J ureteral stent on right without tether  Indication: Misty Page is a 82 y.o. patient with worsening clinical status initially admitted for pain control for proximal right ureteral stent.  After reviewing the management options for treatment, she elected to proceed with the above surgical procedure(s). We have discussed the potential benefits and risks of the procedure, side effects of the proposed treatment, the likelihood of the patient achieving the goals of the procedure, and any potential problems that might occur during the procedure or recuperation. Informed consent has been obtained.  Description of procedure:  The patient was taken to the operating room and general anesthesia was induced.  The patient was placed in the dorsal lithotomy position, prepped and draped in the usual sterile fashion, and preoperative antibiotics were administered. A preoperative time-out was performed.   A 21 French scope was advanced per urethra into the bladder.  The bladder was inspected.  There was diffuse nonspecific erythema as well as evidence of cystitis cystica.  There was some prolapse of the trigone with descent noted.  Attention was turned to the right UO.  On  scout imaging, stones could be seen in the kidney as well as possibly within the proximal ureter.  It is retrograde pyelogram was then performed outlining the collecting system.  There was some very mild hydronephrosis.  A wire was then placed up to the level of the kidney.  A 6 x 22 French double-J ureteral stent was advanced over the wire up to the level of the kidney.  Upon withdrawal of the wire, there was a coil within the renal pelvis as well as within the bladder.  There was some debris noted at the time of stent placement effluxing from the stent.  Bladder was then drained.  The patient was cleaned and dried, repositioned in supine position, reversed of anesthesia, taken to the PACU in stable condition.  Plan: Continue supportive care.  Follow-up urine cultures.  Antibiotics have been broadened.  Discussed the need for staged ureteroscopic intervention in about 2 weeks.  Will arrange for this.  [name], M.D.

## 2022-09-21 ENCOUNTER — Other Ambulatory Visit: Payer: Self-pay | Admitting: Physician Assistant

## 2022-09-21 ENCOUNTER — Inpatient Hospital Stay (HOSPITAL_COMMUNITY)
Admit: 2022-09-21 | Discharge: 2022-09-21 | Disposition: A | Discharge status: Home / Self Care | Payer: Medicare HMO | Attending: Student | Admitting: Student

## 2022-09-21 DIAGNOSIS — R011 Cardiac murmur, unspecified: Secondary | ICD-10-CM

## 2022-09-21 DIAGNOSIS — R6510 Systemic inflammatory response syndrome (SIRS) of non-infectious origin without acute organ dysfunction: Secondary | ICD-10-CM | POA: Diagnosis not present

## 2022-09-21 DIAGNOSIS — N390 Urinary tract infection, site not specified: Secondary | ICD-10-CM | POA: Diagnosis not present

## 2022-09-21 DIAGNOSIS — N201 Calculus of ureter: Secondary | ICD-10-CM | POA: Diagnosis not present

## 2022-09-21 LAB — BASIC METABOLIC PANEL
Anion gap: 7 (ref 5–15)
BUN: 14 mg/dL (ref 8–23)
CO2: 23 mmol/L (ref 22–32)
Calcium: 7.6 mg/dL — ABNORMAL LOW (ref 8.9–10.3)
Chloride: 107 mmol/L (ref 98–111)
Creatinine, Ser: 0.5 mg/dL (ref 0.44–1.00)
GFR, Estimated: >60 mL/min (ref >60)
Glucose, Bld: 136 mg/dL — ABNORMAL HIGH (ref 70–99)
Potassium: 3.5 mmol/L (ref 3.5–5.1)
Sodium: 137 mmol/L (ref 135–145)

## 2022-09-21 LAB — ECHOCARDIOGRAM COMPLETE
AR max vel: 1.53 cm2
AV Area VTI: 1.58 cm2
AV Area mean vel: 1.49 cm2
AV Mean grad: 17.5 mmHg
AV Peak grad: 35 mmHg
Ao pk vel: 2.96 m/s
Area-P 1/2: 3.85 cm2
MV VTI: 3.79 cm2
S' Lateral: 2.3 cm
Weight: 2222.24 oz

## 2022-09-21 LAB — TYPE AND SCREEN
ABO/RH(D): A POS
Antibody Screen: NEGATIVE

## 2022-09-21 LAB — CBC
HCT: 25.6 % — ABNORMAL LOW (ref 36.0–46.0)
Hemoglobin: 7.4 g/dL — ABNORMAL LOW (ref 12.0–15.0)
MCH: 22 pg — ABNORMAL LOW (ref 26.0–34.0)
MCHC: 28.9 g/dL — ABNORMAL LOW (ref 30.0–36.0)
MCV: 76.2 fL — ABNORMAL LOW (ref 80.0–100.0)
Platelets: 319 10*3/uL (ref 150–400)
RBC: 3.36 MIL/uL — ABNORMAL LOW (ref 3.87–5.11)
RDW: 19.6 % — ABNORMAL HIGH (ref 11.5–15.5)
WBC: 15 10*3/uL — ABNORMAL HIGH (ref 4.0–10.5)
nRBC: 0 % (ref 0.0–0.2)

## 2022-09-21 LAB — PHOSPHORUS: Phosphorus: 1.8 mg/dL — ABNORMAL LOW (ref 2.5–4.6)

## 2022-09-21 LAB — MAGNESIUM: Magnesium: 2.4 mg/dL (ref 1.7–2.4)

## 2022-09-21 LAB — URINE CULTURE

## 2022-09-21 LAB — HEMOGLOBIN AND HEMATOCRIT, BLOOD
HCT: 28.8 % — ABNORMAL LOW (ref 36.0–46.0)
Hemoglobin: 8.4 g/dL — ABNORMAL LOW (ref 12.0–15.0)

## 2022-09-21 MED ORDER — POTASSIUM PHOSPHATES 15 MMOLE/5ML IV SOLN
30.0000 mmol | Freq: Once | INTRAVENOUS | Status: AC
  Administered 2022-09-21: 30 mmol via INTRAVENOUS
  Filled 2022-09-21: qty 10

## 2022-09-21 MED ORDER — FUROSEMIDE 10 MG/ML IJ SOLN
INTRAMUSCULAR | Status: AC
  Filled 2022-09-21: qty 4

## 2022-09-21 NOTE — Progress Notes (Signed)
Urology Inpatient Progress Note  Subjective: No acute events overnight.  She is afebrile, VSS. Creatinine down today, 0.50.  WBC count down, 15.0.  Urine culture growing pansensitive staph epi, antibiotics as below. She reports intermittent right flank pain after receiving Lasix but is otherwise comfortable.  She is voiding spontaneously dark yellow urine.  Anti-infectives: Anti-infectives (From admission, onward)    Start     Dose/Rate Route Frequency Ordered Stop   09/20/22 0500  cefTRIAXone (ROCEPHIN) 1 g in sodium chloride 0.9 % 100 mL IVPB  Status:  Discontinued        1 g 200 mL/hr over 30 Minutes Intravenous Every 24 hours 09/19/22 1000 09/20/22 0712   09/19/22 2115  ciprofloxacin (CIPRO) IVPB 400 mg        400 mg 200 mL/hr over 60 Minutes Intravenous Every 12 hours 09/19/22 2029     09/19/22 0530  cefTRIAXone (ROCEPHIN) 1 g in sodium chloride 0.9 % 100 mL IVPB        1 g 200 mL/hr over 30 Minutes Intravenous  Once 09/19/22 0529 09/19/22 0630       Current Facility-Administered Medications  Medication Dose Route Frequency Provider Last Rate Last Admin   acetaminophen (TYLENOL) tablet 650 mg  650 mg Oral Q6H PRN Lorretta Harp, MD   650 mg at 09/19/22 1026   acidophilus (RISAQUAD) capsule 1 capsule  1 capsule Oral Daily Lorretta Harp, MD   1 capsule at 09/21/22 1610   ascorbic acid (VITAMIN C) tablet 1,000 mg  1,000 mg Oral q morning Lorretta Harp, MD   1,000 mg at 09/21/22 0827   aspirin EC tablet 81 mg  81 mg Oral q morning Lorretta Harp, MD   81 mg at 09/21/22 9604   calcium-vitamin D (OSCAL WITH D) 500-5 MG-MCG per tablet 1 tablet  1 tablet Oral Q breakfast Lorretta Harp, MD   1 tablet at 09/21/22 0827   ciprofloxacin (CIPRO) IVPB 400 mg  400 mg Intravenous Q12H [name], MD 200 mL/hr at 09/21/22 0033 400 mg at 09/21/22 0033   dextrose 50 % solution 50 mL  50 mL Intravenous PRN Lorretta Harp, MD       ezetimibe (ZETIA) tablet 10 mg  10 mg Oral Daily Lorretta Harp, MD   10 mg at 09/21/22  0829   famotidine (PEPCID) tablet 20 mg  20 mg Oral BID Lorretta Harp, MD   20 mg at 09/21/22 0827   ferrous sulfate tablet 325 mg  325 mg Oral Q breakfast Lorretta Harp, MD   325 mg at 09/21/22 0828   fluticasone (FLONASE) 50 MCG/ACT nasal spray 1 spray  1 spray Each Nare QHS PRN Lorretta Harp, MD       furosemide (LASIX) injection 40 mg  40 mg Intravenous BID Gillis Santa, MD   40 mg at 09/21/22 0827   ipratropium-albuterol (DUONEB) 0.5-2.5 (3) MG/3ML nebulizer solution 3 mL  3 mL Nebulization Q6H PRN Gillis Santa, MD       iron polysaccharides (NIFEREX) capsule 150 mg  150 mg Oral Daily Gillis Santa, MD   150 mg at 09/21/22 5409   meclizine (ANTIVERT) tablet 25 mg  25 mg Oral TID PRN Lorretta Harp, MD       Mesalamine (ASACOL) DR capsule 800 mg  800 mg Oral TID Lorretta Harp, MD   800 mg at 09/21/22 8119   methIMAzole (TAPAZOLE) tablet 2.5 mg  2.5 mg Oral Once per day on Sun Tue Thu Sat Barrie Folk, [facility]  2.5 mg at 09/21/22 4098   methimazole (TAPAZOLE) tablet 5 mg  5 mg Oral Once per day on Mon Wed Fri Lorretta Harp, MD   5 mg at 09/20/22 1232   ondansetron [facility]) injection 4 mg  4 mg Intravenous Q8H PRN Lorretta Harp, MD   4 mg at 09/19/22 2107   oxyCODONE-acetaminophen (PERCOCET/ROXICET) 5-325 MG per tablet 1 tablet  1 tablet Oral Q4H PRN Lorretta Harp, MD   1 tablet at 09/20/22 1708   potassium PHOSPHATE 30 mmol in dextrose 5 % 500 mL infusion  30 mmol Intravenous Once Gillis Santa, MD       rosuvastatin (CRESTOR) tablet 10 mg  10 mg Oral Daily Lorretta Harp, MD   10 mg at 09/21/22 0827   tamsulosin (FLOMAX) capsule 0.4 mg  0.4 mg Oral Daily Lorretta Harp, MD   0.4 mg at 09/21/22 0827   Objective: Vital signs in last 24 hours: Temp:  [97.6 F (36.4 C)-98.5 F (36.9 C)] 98.1 F (36.7 C) (04/23 0738) Pulse Rate:  [93-109] 97 (04/23 0738) Resp:  [18-20] 18 (04/23 0738) BP: (103-156)/(48-73) 149/73 (04/23 0738) SpO2:  [93 %-96 %] 93 % (04/23 0738)  Intake/Output from previous day: 04/22 0701 - 04/23  0700 In: 1021.4 [I.V.:500; IV Piggyback:521.4] Out: 2550 [Urine:2550] Intake/Output this shift: No intake/output data recorded.  Physical Exam Vitals and nursing note reviewed.  Constitutional:      General: She is not in acute distress.    Appearance: She is not ill-appearing, toxic-appearing or diaphoretic.  HENT:     Head: Normocephalic and atraumatic.  Pulmonary:     Effort: Pulmonary effort is normal. No respiratory distress.  Skin:    General: Skin is warm and dry.  Neurological:     Mental Status: She is alert and oriented to person, place, and time.  Psychiatric:        Mood and Affect: Mood normal.        Behavior: Behavior normal.     Lab Results:  Recent Labs    09/20/22 0415 09/21/22 0414  WBC 21.6* 15.0*  HGB 7.8* 7.4*  HCT 26.9* 25.6*  PLT 310 319   BMET Recent Labs    09/20/22 0415 09/21/22 0414  NA 135 137  K 4.0 3.5  CL 109 107  CO2 20* 23  GLUCOSE 124* 136*  BUN 18 14  CREATININE 0.95 0.50  CALCIUM 7.2* 7.6*   PT/INR Recent Labs    09/19/22 0847  LABPROT 13.7  INR 1.1   Studies/Results: DG Chest Port 1 View  Result Date: 09/20/2022 CLINICAL DATA:  Shortness of breath EXAM: PORTABLE CHEST 1 VIEW COMPARISON:  Portable exam 1242 hours compared to 12/16/2021 FINDINGS: Normal heart size, mediastinal contours, and pulmonary vascularity. Abnormal fullness of LEFT pulmonary hilum. Atherosclerotic calcification aorta. Peribronchial thickening with scattered interstitial infiltrates bilaterally which could represent pulmonary edema or atypical infection. Probable small LEFT pleural effusion. No pneumothorax or acute osseous findings. Diffuse osseous demineralization. IMPRESSION: Bronchitic changes with scattered interstitial infiltrates bilaterally question pulmonary edema versus atypical infection. Abnormal fullness of LEFT pulmonary hilum, adenopathy/mass not excluded; either CT chest with contrast evaluation or radiographic follow-up until  resolution recommended to exclude hilar adenopathy. Electronically Signed   By: Ulyses Southward M.D.   On: 09/20/2022 12:57   DG OR UROLOGY CYSTO IMAGE ([facility] ONLY)  Result Date: 09/20/2022 There is no interpretation for this exam.  This order is for images obtained during a surgical procedure.  Please See "Surgeries" Tab for  more information regarding the procedure.   DG Abd 1 View  Result Date: 09/19/2022 CLINICAL DATA:  Kidney stone EXAM: ABDOMEN - 1 VIEW COMPARISON:  X-ray 07/21/2009.  CT scan 09/19/2022 without contrast FINDINGS: Gas seen in nondilated loops of small and large bowel with scattered colonic stool. Gas and stool are seen overlying the reniform shadows. There is calcification overlying the upper pole of the right kidney consistent known renal stone as seen on CT. Other tiny foci are not seen in their entirety on this x-ray. The stone in the proximal right ureter at the level of the L3 by CT is not clearly seen on the current x-ray but there again is significant overlapping bowel gas and stool in this location scattered vascular calcifications are identified as well. IMPRESSION: Renal stones are again seen as on prior CT scan. The stone in the proximal right ureter is not clearly seen on this x-ray with overlapping bowel gas and soft tissue. Nonspecific bowel gas pattern. Electronically Signed   By: Karen Kays M.D.   On: 09/19/2022 20:54    Assessment & Plan: 82 year old female who underwent right ureteral stent placement with Dr. Apolinar Junes yesterday for management of a proximal right ureteral stone with UTI and SIRS criteria.  She is clinically improving today on culture appropriate Cipro.  She is having some mild stent discomfort is already on Flomax.  We discussed that she will require outpatient right ureteroscopy with laser lithotripsy and stent exchange with Dr. Apolinar Junes in 2 to 3 weeks.  She expressed understanding.  All questions answered.  Recommendations: -Continue antibiotics  for total of 10 to 14 days of culture appropriate therapy -Continue Flomax, consider adding oxybutynin 5 mg 3 times daily as needed for stent discomfort -Outpatient right URS/LL/stent exchange in 2 to 3 weeks, we will contact her to schedule  Carman Ching, [name] 09/21/2022

## 2022-09-21 NOTE — Progress Notes (Signed)
[facility] Progress Note  Patient: Misty Page    ZOX:[id number]  DOA: 09/19/2022     Date of Service: the patient was seen and examined on 09/21/2022  Chief Complaint  Patient presents with   Flank Pain   Brief hospital course: Misty Page is a 82 y.o. 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  CT renal stone protocol: 1. A 3 mm stone in the proximal right ureter causing mild hydroureteronephrosis. 2. Multiple nonobstructing stones of the both kidneys, the largest measuring 8 mm. Urology was consulted and patient was admitted for further management as below.  Assessment and Plan: # Right ureteral stone with infection, and hydroureteronephros and severe sepsis:  BP dropped, patient was given IV fluid bolus, Solu-Cortef. Continue ceftriaxone Urine consulted s/p Right retrograde pyelogram and Right ureteral stent placement Recommended to follow in 2 weeks for staged ureteroscopic intervention, follow-up will be arranged by urology. Ucx growing staph epi, pansensitive   Acute hypoxic respiratory failure due to pulmonary edema, Resolved  CXR: Bronchitic changes with scattered interstitial infiltrates bilaterally question pulmonary edema versus atypical infection. Abnormal fullness of LEFT pulmonary hilum, adenopathy/mass not excluded; either CT chest with contrast evaluation or radiographic follow-up until resolution recommended to exclude hilar adenopathy. Discontinued IV fluid S/p Lasix 40 mg IV twice daily, d/c'd on 4/23 DuoNeb every 6 hourly as needed Supplemental O2 inhalation has been weaned off, currently patient is saturating well on room air   CAD (coronary artery disease) -Aspirin -Hold Plavix in case patient need procedure -Crestor Aortic valve  stenosis, murmur audible, TTE shows LVEF 65 to 70%, moderate pulmonary hypertension, left atrium moderately dilated, mild to moderate aortic valve stenosis  Essential (primary) hypertension -continue to hold Amlodipine and  Cozaar  Pt developed hypotension, s/p IV fluid bolus given Continue to monitor BP and titrate medications accordingly -IV hydralazine as needed   Hypercholesteremia -Crestor, Zetia   Hyperthyroidism -Methimazole   Ulcerative colitis -Continue home mesalamine   History of TIA (transient ischemic attack) -Aspirin, Crestor -Hold Plavix, resume when cleared by urology    Iron deficiency anemia: Hemoglobin stable 9.5 (7.6 on 07/23/2022) -Continue home iron supplement   Lung nodule: This is incidental finding by CT scan. -Follow-up with PCP as outpatient   Hypophosphatemia, Phos repleted. Hypomagnesemia, mag repleted. Monitor electrolytes and replete as needed.   Anemia of chronic disease and iron deficiency, transferrin saturation 2% hemoglobin 7.8 Folate within normal range, B12 within normal range Started oral iron supplement, avoided IV iron due to possible UTI Follow with PCP to repeat iron profile after 3 months 4/23 Hb 7.4, monitor H&H and transfuse if hemoglobin less than 7   Body mass index is 25.4 kg/m.  Interventions:      Diet: Heart Healthy diet DVT Prophylaxis: SCD, pharmacological prophylaxis contraindicated due to risk of bleeding    Advance goals of care discussion: DNR  Family Communication: family was present at bedside, at the time of interview.  The pt provided permission to discuss medical plan with the family. Opportunity was given to ask question and all questions were answered satisfactorily.   Disposition:  Pt is from Home, admitted with Abd pain, found to have ureteral stone with obstruction, status post cystoscopy and stent insertion, s/p respiratory failure resolved after IV lasix, still has low Hb and low phos,  which  precludes a safe discharge. Discharge to Home, when clinically stable, in 1 to 2 days  Subjective: No significant events overnight, patient feels improvement in the shortness of breath, oxygen has been weaned off, currently saturating well on room air.  Patient denies any chest pain or palpitation, no abdominal pain no any active issues at this time. Patient's daughter was at bedside, management plan discussed.  We will continue to monitor her hemoglobin and replete phosphorus today and plan for discharge tomorrow a.m. if remains stable. Patient agreed for blood transfusion if hemoglobin drops below 7.  Physical Exam: General: NAD, lying comfortably Appear in no distress, affect appropriate Eyes: PERRLA ENT: Oral Mucosa Clear, moist  Neck: no JVD,  Cardiovascular: S1 and S2 Present, Murmur audible,  Respiratory: Good air entry bilaterally, No crackles and no wheezing appreciated. Abdomen: Bowel Sound present, Soft and no tenderness,  Skin: no rashes Extremities: no Pedal edema, no calf tenderness Neurologic: without any new focal findings Gait not checked due to patient safety concerns  Vitals:   09/20/22 2022 09/21/22 0308 09/21/22 0738 09/21/22 1525  BP: (!) 117/59 (!) 156/72 (!) 149/73 120/62  Pulse: 99 97 97 89  Resp: Temp: 98.2 F (36.8 C) 98.2 F (36.8 C) 98.1 F (36.7 C) 98.4 F (36.9 C)  TempSrc: Oral Oral    SpO2: 93% 96% 93% 93%  Weight:      Height:        Intake/Output Summary (Last 24 hours) at 09/21/2022 1532 Last data filed at 09/21/2022 1437 Gross per 24 hour  Intake 761.44 ml  Output 4050 ml  Net -3288.56 ml   Filed Weights   09/19/22 0157 09/19/22 2200  Weight: 59 kg 63 kg    Data Reviewed: I have personally reviewed and interpreted daily labs, tele strips, imagings as discussed above. I reviewed all nursing notes, pharmacy notes, vitals, pertinent old records I have discussed plan of care as described above with RN and  patient/family.  CBC: Recent Labs  Lab 09/19/22 0200 09/20/22 0415 09/21/22 0414  WBC 16.0* 21.6* 15.0*  HGB 9.5* 7.8* 7.4*  HCT 32.9* 26.9* 25.6*  MCV 76.3* 77.7* 76.2*  PLT 441* 310 319   Basic Metabolic Panel: Recent Labs  Lab 09/19/22 0200 09/20/22 0415 09/20/22 0912 09/21/22 0414  NA 135 135  --  137  K 3.8 4.0  --  3.5  CL 103 109  --  107  CO2 18* 20*  --  23  GLUCOSE 157* 124*  --  136*  BUN 16 18  --  14  CREATININE 0.56 0.95  --  0.50  CALCIUM 9.0 7.2*  --  7.6*  MG  --   --  1.6* 2.4  PHOS  --   --  2.5 1.8*    Studies: ECHOCARDIOGRAM COMPLETE  Result Date: 09/21/2022    ECHOCARDIOGRAM REPORT   Patient Name:   RANAE CASEBIER Date of Exam: 09/21/2022 Medical Rec #:  [id number]     Height:       62.0 in Accession #:    [id number]    Weight:       138.9 lb Date of Birth:  1941/03/08     BSA:          1.637 m Patient Age:    81 years      BP:           149/73 mmHg Patient Gender: F  HR:           96 bpm. Exam Location:  [facility] Procedure: 2D Echo, Cardiac Doppler and Color Doppler Indications:     Murmur  History:         Patient has no prior history of Echocardiogram examinations.                  CAD and Previous Myocardial Infarction, TIA,                  Signs/Symptoms:Murmur; Risk Factors:Hypertension.  Sonographer:     Mikki Harbor Referring Phys:  [id number] Gillis Santa Diagnosing Phys: Arvilla Meres MD  Sonographer Comments: Technically difficult study due to poor echo windows. Image acquisition challenging due to breast implants. IMPRESSIONS  1. Left ventricular ejection fraction, by estimation, is 65 to 70%. The left ventricle has normal function. The left ventricle has no regional wall motion abnormalities. Left ventricular diastolic parameters were normal.  2. Right ventricular systolic function is normal. The right ventricular size is normal. There is moderately elevated pulmonary artery systolic pressure.  3. Left atrial size was moderately dilated.   4. Right atrial size was mildly dilated.  5. The mitral valve is normal in structure. Mild mitral valve regurgitation. No evidence of mitral stenosis.  6. The aortic valve is tricuspid. There is moderate calcification of the aortic valve. Aortic valve regurgitation is mild. Mild to moderate aortic valve stenosis. Aortic valve area, by VTI measures 1.58 cm. Aortic valve mean gradient measures 17.5 mmHg. Aortic valve Vmax measures 2.96 m/s. Visually AS appears more in the moderate range.  7. The inferior vena cava is normal in size with greater than 50% respiratory variability, suggesting right atrial pressure of 3 mmHg. FINDINGS  Left Ventricle: Left ventricular ejection fraction, by estimation, is 65 to 70%. The left ventricle has normal function. The left ventricle has no regional wall motion abnormalities. The left ventricular internal cavity size was normal in size. There is  no left ventricular hypertrophy. Left ventricular diastolic parameters were normal. Right Ventricle: The right ventricular size is normal. No increase in right ventricular wall thickness. Right ventricular systolic function is normal. There is moderately elevated pulmonary artery systolic pressure. The tricuspid regurgitant velocity is 3.37 m/s, and with an assumed right atrial pressure of 8 mmHg, the estimated right ventricular systolic pressure is 53.4 mmHg. Left Atrium: Left atrial size was moderately dilated. Right Atrium: Right atrial size was mildly dilated. Pericardium: There is no evidence of pericardial effusion. Mitral Valve: The mitral valve is normal in structure. Mild mitral valve regurgitation. No evidence of mitral valve stenosis. MV peak gradient, 5.6 mmHg. The mean mitral valve gradient is 3.0 mmHg. Tricuspid Valve: The tricuspid valve is normal in structure. Tricuspid valve regurgitation is mild . No evidence of tricuspid stenosis. Aortic Valve: The aortic valve is tricuspid. There is moderate calcification of the aortic  valve. Aortic valve regurgitation is mild. Mild to moderate aortic stenosis is present. Aortic valve mean gradient measures 17.5 mmHg. Aortic valve peak gradient measures 35.0 mmHg. Aortic valve area, by VTI measures 1.58 cm. Pulmonic Valve: The pulmonic valve was normal in structure. Pulmonic valve regurgitation is trivial. No evidence of pulmonic stenosis. Aorta: The aortic root is normal in size and structure. Venous: The inferior vena cava is normal in size with greater than 50% respiratory variability, suggesting right atrial pressure of 3 mmHg. IAS/Shunts: The interatrial septum appears to be lipomatous. No atrial level shunt detected by color flow Doppler.  LEFT VENTRICLE PLAX 2D LVIDd:         4.20 cm   Diastology LVIDs:         2.30 cm   LV e' medial:    9.46 cm/s LV PW:         1.30 cm   LV E/e' medial:  11.6 LV IVS:        1.40 cm   LV e' lateral:   11.60 cm/s LVOT diam:     2.10 cm   LV E/e' lateral: 9.5 LV SV:         99 LV SV Index:   61 LVOT Area:     3.46 cm  RIGHT VENTRICLE RV Basal diam:  3.35 cm RV Mid diam:    3.50 cm RV S prime:     13.30 cm/s TAPSE (M-mode): 2.0 cm LEFT ATRIUM             Index        RIGHT ATRIUM           Index LA diam:        4.40 cm 2.69 cm/m   RA Area:     16.50 cm LA Vol (A2C):   84.2 ml 51.43 ml/m  RA Volume:   44.50 ml  27.18 ml/m LA Vol (A4C):   54.7 ml 33.41 ml/m LA Biplane Vol: 68.7 ml 41.96 ml/m  AORTIC VALVE                     PULMONIC VALVE AV Area (Vmax):    1.53 cm      PV Vmax:       1.32 m/s AV Area (Vmean):   1.49 cm      PV Peak grad:  7.0 mmHg AV Area (VTI):     1.58 cm AV Vmax:           296.00 cm/s AV Vmean:          186.500 cm/s AV VTI:            0.631 m AV Peak Grad:      35.0 mmHg AV Mean Grad:      17.5 mmHg LVOT Vmax:         131.00 cm/s LVOT Vmean:        80.200 cm/s LVOT VTI:          0.287 m LVOT/AV VTI ratio: 0.45  AORTA Ao Root diam: 3.80 cm Ao Asc diam:  3.70 cm MITRAL VALVE                TRICUSPID VALVE MV Area (PHT): 3.85 cm      TR Peak grad:   45.4 mmHg MV Area VTI:   3.79 cm     TR Vmax:        337.00 cm/s MV Peak grad:  5.6 mmHg MV Mean grad:  3.0 mmHg     SHUNTS MV Vmax:       1.18 m/s     Systemic VTI:  0.29 m MV Vmean:      84.8 cm/s    Systemic Diam: 2.10 cm MV Decel Time: 197 msec MV E velocity: 110.00 cm/s MV A velocity: 112.00 cm/s MV E/A ratio:  0.98 Arvilla Meres MD Electronically signed by Arvilla Meres MD Signature Date/Time: 09/21/2022/1:03:59 PM    Final     Scheduled Meds:  acidophilus  1 capsule Oral Daily   vitamin C  1,000 mg Oral q morning  aspirin EC  81 mg Oral q morning   calcium-vitamin D  1 tablet Oral Q breakfast   ezetimibe  10 mg Oral Daily   famotidine  20 mg Oral BID   ferrous sulfate  325 mg Oral Q breakfast   furosemide  40 mg Intravenous BID   iron polysaccharides  150 mg Oral Daily   Mesalamine  800 mg Oral TID   methIMAzole  2.5 mg Oral Once per day on Sun Tue Thu Sat   methimazole  5 mg Oral Once per day on Mon Wed Fri   rosuvastatin  10 mg Oral Daily   tamsulosin  0.4 mg Oral Daily   Continuous Infusions:  ciprofloxacin 400 mg (09/21/22 1432)   potassium PHOSPHATE IVPB (in mmol) 30 mmol (09/21/22 1109)   PRN Meds: acetaminophen, dextrose, fluticasone, ipratropium-albuterol, meclizine, ondansetron (ZOFRAN) IV, oxyCODONE-acetaminophen  Time spent: 35 minutes  Author: Gillis Santa. MD [facility] 09/21/2022 3:32 PM  To reach On-call, see care teams to locate the attending and reach out to them via [email or web address]. If 7PM-7AM, please contact night-coverage If you still have difficulty reaching the attending provider, please page the [facility] (Director on Call) for [facility] on amion for assistance.

## 2022-09-21 NOTE — Progress Notes (Signed)
*  PRELIMINARY RESULTS* Echocardiogram 2D Echocardiogram has been performed.  Misty Page 09/21/2022, 11:53 AM

## 2022-09-21 NOTE — Progress Notes (Signed)
  Transition of Care [facility]) Screening Note   Patient Details  Name: Misty Page Date of Birth: Nov 27, 1940   Transition of [facility]) CM/SW Contact:    Chapman Fitch, RN Phone Number: 09/21/2022, 3:04 PM    Transition of Care Department [facility]) has reviewed patient and no TOC needs have been identified at this time. We will continue to monitor patient advancement through interdisciplinary progression rounds. If new patient transition needs arise, please place a TOC consult.

## 2022-09-21 NOTE — Progress Notes (Signed)
Surgical Physician Order Form [facility]  Dr. Apolinar Junes * Scheduling expectation :  2-3 weeks  *Length of Case:   *Clearance needed: yes, cardiac. Would ideally like to hold Plavix, could do on aspirin instead  *Anticoagulation Instructions: Hold all anticoagulants  *Aspirin Instructions: N/A  *Post-op visit Date/Instructions:   TBD  *Diagnosis: Right Ureteral Stone  *Procedure: right  Ureteroscopy w/laser lithotripsy & stent exchange (40981)   Additional orders: N/A  -Admit type: OUTpatient  -Anesthesia: General  -VTE Prophylaxis Standing Order SCD's       Other:   -Standing Lab Orders Per Anesthesia    Lab other: None  -Standing Test orders EKG/Chest x-ray per Anesthesia       Test other:   - Medications:  Cipro  IV  -Other orders:  N/A

## 2022-09-21 NOTE — H&P (View-Only) (Signed)
Urology Inpatient Progress Note  Subjective: No acute events overnight.  She is afebrile, VSS. Creatinine down today, 0.50.  WBC count down, 15.0.  Urine culture growing pansensitive staph epi, antibiotics as below. She reports intermittent right flank pain after receiving Lasix but is otherwise comfortable.  She is voiding spontaneously dark yellow urine.  Anti-infectives: Anti-infectives (From admission, onward)    Start     Dose/Rate Route Frequency Ordered Stop   09/20/22 0500  cefTRIAXone (ROCEPHIN) 1 g in sodium chloride 0.9 % 100 mL IVPB  Status:  Discontinued        1 g 200 mL/hr over 30 Minutes Intravenous Every 24 hours 09/19/22 1000 09/20/22 0712   09/19/22 2115  ciprofloxacin (CIPRO) IVPB 400 mg        400 mg 200 mL/hr over 60 Minutes Intravenous Every 12 hours 09/19/22 2029     09/19/22 0530  cefTRIAXone (ROCEPHIN) 1 g in sodium chloride 0.9 % 100 mL IVPB        1 g 200 mL/hr over 30 Minutes Intravenous  Once 09/19/22 0529 09/19/22 0630       Current Facility-Administered Medications  Medication Dose Route Frequency Provider Last Rate Last Admin   acetaminophen (TYLENOL) tablet 650 mg  650 mg Oral Q6H PRN Niu, Xilin, MD   650 mg at 09/19/22 1026   acidophilus (RISAQUAD) capsule 1 capsule  1 capsule Oral Daily Niu, Xilin, MD   1 capsule at 09/21/22 0828   ascorbic acid (VITAMIN C) tablet 1,000 mg  1,000 mg Oral q morning Niu, Xilin, MD   1,000 mg at 09/21/22 0827   aspirin EC tablet 81 mg  81 mg Oral q morning Niu, Xilin, MD   81 mg at 09/21/22 0828   calcium-vitamin D (OSCAL WITH D) 500-5 MG-MCG per tablet 1 tablet  1 tablet Oral Q breakfast Niu, Xilin, MD   1 tablet at 09/21/22 0827   ciprofloxacin (CIPRO) IVPB 400 mg  400 mg Intravenous Q12H Brandon, Ashley, MD 200 mL/hr at 09/21/22 0033 400 mg at 09/21/22 0033   dextrose 50 % solution 50 mL  50 mL Intravenous PRN Niu, Xilin, MD       ezetimibe (ZETIA) tablet 10 mg  10 mg Oral Daily Niu, Xilin, MD   10 mg at 09/21/22  0829   famotidine (PEPCID) tablet 20 mg  20 mg Oral BID Niu, Xilin, MD   20 mg at 09/21/22 0827   ferrous sulfate tablet 325 mg  325 mg Oral Q breakfast Niu, Xilin, MD   325 mg at 09/21/22 0828   fluticasone (FLONASE) 50 MCG/ACT nasal spray 1 spray  1 spray Each Nare QHS PRN Niu, Xilin, MD       furosemide (LASIX) injection 40 mg  40 mg Intravenous BID Kumar, Dileep, MD   40 mg at 09/21/22 0827   ipratropium-albuterol (DUONEB) 0.5-2.5 (3) MG/3ML nebulizer solution 3 mL  3 mL Nebulization Q6H PRN Kumar, Dileep, MD       iron polysaccharides (NIFEREX) capsule 150 mg  150 mg Oral Daily Kumar, Dileep, MD   150 mg at 09/21/22 0828   meclizine (ANTIVERT) tablet 25 mg  25 mg Oral TID PRN Niu, Xilin, MD       Mesalamine (ASACOL) DR capsule 800 mg  800 mg Oral TID Niu, Xilin, MD   800 mg at 09/21/22 0828   methIMAzole (TAPAZOLE) tablet 2.5 mg  2.5 mg Oral Once per day on Sun Tue Thu Sat Greenwood, Howard F, RPH     2.5 mg at 09/21/22 0829   methimazole (TAPAZOLE) tablet 5 mg  5 mg Oral Once per day on Mon Wed Fri Niu, Xilin, MD   5 mg at 09/20/22 1232   ondansetron (ZOFRAN) injection 4 mg  4 mg Intravenous Q8H PRN Niu, Xilin, MD   4 mg at 09/19/22 2107   oxyCODONE-acetaminophen (PERCOCET/ROXICET) 5-325 MG per tablet 1 tablet  1 tablet Oral Q4H PRN Niu, Xilin, MD   1 tablet at 09/20/22 1708   potassium PHOSPHATE 30 mmol in dextrose 5 % 500 mL infusion  30 mmol Intravenous Once Kumar, Dileep, MD       rosuvastatin (CRESTOR) tablet 10 mg  10 mg Oral Daily Niu, Xilin, MD   10 mg at 09/21/22 0827   tamsulosin (FLOMAX) capsule 0.4 mg  0.4 mg Oral Daily Niu, Xilin, MD   0.4 mg at 09/21/22 0827   Objective: Vital signs in last 24 hours: Temp:  [97.6 F (36.4 C)-98.5 F (36.9 C)] 98.1 F (36.7 C) (04/23 0738) Pulse Rate:  [93-109] 97 (04/23 0738) Resp:  [18-20] 18 (04/23 0738) BP: (103-156)/(48-73) 149/73 (04/23 0738) SpO2:  [93 %-96 %] 93 % (04/23 0738)  Intake/Output from previous day: 04/22 0701 - 04/23  0700 In: 1021.4 [I.V.:500; IV Piggyback:521.4] Out: 2550 [Urine:2550] Intake/Output this shift: No intake/output data recorded.  Physical Exam Vitals and nursing note reviewed.  Constitutional:      General: She is not in acute distress.    Appearance: She is not ill-appearing, toxic-appearing or diaphoretic.  HENT:     Head: Normocephalic and atraumatic.  Pulmonary:     Effort: Pulmonary effort is normal. No respiratory distress.  Skin:    General: Skin is warm and dry.  Neurological:     Mental Status: She is alert and oriented to person, place, and time.  Psychiatric:        Mood and Affect: Mood normal.        Behavior: Behavior normal.     Lab Results:  Recent Labs    09/20/22 0415 09/21/22 0414  WBC 21.6* 15.0*  HGB 7.8* 7.4*  HCT 26.9* 25.6*  PLT 310 319   BMET Recent Labs    09/20/22 0415 09/21/22 0414  NA 135 137  K 4.0 3.5  CL 109 107  CO2 20* 23  GLUCOSE 124* 136*  BUN 18 14  CREATININE 0.95 0.50  CALCIUM 7.2* 7.6*   PT/INR Recent Labs    09/19/22 0847  LABPROT 13.7  INR 1.1   Studies/Results: DG Chest Port 1 View  Result Date: 09/20/2022 CLINICAL DATA:  Shortness of breath EXAM: PORTABLE CHEST 1 VIEW COMPARISON:  Portable exam 1242 hours compared to 12/16/2021 FINDINGS: Normal heart size, mediastinal contours, and pulmonary vascularity. Abnormal fullness of LEFT pulmonary hilum. Atherosclerotic calcification aorta. Peribronchial thickening with scattered interstitial infiltrates bilaterally which could represent pulmonary edema or atypical infection. Probable small LEFT pleural effusion. No pneumothorax or acute osseous findings. Diffuse osseous demineralization. IMPRESSION: Bronchitic changes with scattered interstitial infiltrates bilaterally question pulmonary edema versus atypical infection. Abnormal fullness of LEFT pulmonary hilum, adenopathy/mass not excluded; either CT chest with contrast evaluation or radiographic follow-up until  resolution recommended to exclude hilar adenopathy. Electronically Signed   By: Mark  Boles M.D.   On: 09/20/2022 12:57   DG OR UROLOGY CYSTO IMAGE ([facility] ONLY)  Result Date: 09/20/2022 There is no interpretation for this exam.  This order is for images obtained during a surgical procedure.  Please See "Surgeries" Tab for   more information regarding the procedure.   DG Abd 1 View  Result Date: 09/19/2022 CLINICAL DATA:  Kidney stone EXAM: ABDOMEN - 1 VIEW COMPARISON:  X-ray 07/21/2009.  CT scan 09/19/2022 without contrast FINDINGS: Gas seen in nondilated loops of small and large bowel with scattered colonic stool. Gas and stool are seen overlying the reniform shadows. There is calcification overlying the upper pole of the right kidney consistent known renal stone as seen on CT. Other tiny foci are not seen in their entirety on this x-ray. The stone in the proximal right ureter at the level of the L3 by CT is not clearly seen on the current x-ray but there again is significant overlapping bowel gas and stool in this location scattered vascular calcifications are identified as well. IMPRESSION: Renal stones are again seen as on prior CT scan. The stone in the proximal right ureter is not clearly seen on this x-ray with overlapping bowel gas and soft tissue. Nonspecific bowel gas pattern. Electronically Signed   By: Ashok  Gupta M.D.   On: 09/19/2022 20:54    Assessment & Plan: 81-year-old female who underwent right ureteral stent placement with Dr. Brandon yesterday for management of a proximal right ureteral stone with UTI and SIRS criteria.  She is clinically improving today on culture appropriate Cipro.  She is having some mild stent discomfort is already on Flomax.  We discussed that she will require outpatient right ureteroscopy with laser lithotripsy and stent exchange with Dr. Brandon in 2 to 3 weeks.  She expressed understanding.  All questions answered.  Recommendations: -Continue antibiotics  for total of 10 to 14 days of culture appropriate therapy -Continue Flomax, consider adding oxybutynin 5 mg 3 times daily as needed for stent discomfort -Outpatient right URS/LL/stent exchange in 2 to 3 weeks, we will contact her to schedule  Susa Bones, [name] 09/21/2022  

## 2022-09-22 DIAGNOSIS — N201 Calculus of ureter: Secondary | ICD-10-CM | POA: Diagnosis not present

## 2022-09-22 LAB — BASIC METABOLIC PANEL
Anion gap: 6 (ref 5–15)
BUN: 16 mg/dL (ref 8–23)
CO2: 25 mmol/L (ref 22–32)
Calcium: 7.5 mg/dL — ABNORMAL LOW (ref 8.9–10.3)
Chloride: 103 mmol/L (ref 98–111)
Creatinine, Ser: 0.55 mg/dL (ref 0.44–1.00)
GFR, Estimated: >60 mL/min (ref >60)
Glucose, Bld: 99 mg/dL (ref 70–99)
Potassium: 2.8 mmol/L — ABNORMAL LOW (ref 3.5–5.1)
Sodium: 134 mmol/L — ABNORMAL LOW (ref 135–145)

## 2022-09-22 LAB — CBC
HCT: 27 % — ABNORMAL LOW (ref 36.0–46.0)
Hemoglobin: 8 g/dL — ABNORMAL LOW (ref 12.0–15.0)
MCH: 22.3 pg — ABNORMAL LOW (ref 26.0–34.0)
MCHC: 29.6 g/dL — ABNORMAL LOW (ref 30.0–36.0)
MCV: 75.4 fL — ABNORMAL LOW (ref 80.0–100.0)
Platelets: 367 10*3/uL (ref 150–400)
RBC: 3.58 MIL/uL — ABNORMAL LOW (ref 3.87–5.11)
RDW: 19.6 % — ABNORMAL HIGH (ref 11.5–15.5)
WBC: 9.2 10*3/uL (ref 4.0–10.5)
nRBC: 0 % (ref 0.0–0.2)

## 2022-09-22 LAB — MAGNESIUM: Magnesium: 1.8 mg/dL (ref 1.7–2.4)

## 2022-09-22 LAB — PHOSPHORUS: Phosphorus: 2 mg/dL — ABNORMAL LOW (ref 2.5–4.6)

## 2022-09-22 MED ORDER — POTASSIUM CHLORIDE CRYS ER 20 MEQ PO TBCR
40.0000 meq | EXTENDED_RELEASE_TABLET | Freq: Once | ORAL | Status: AC
  Administered 2022-09-22: 40 meq via ORAL
  Filled 2022-09-22: qty 2

## 2022-09-22 MED ORDER — CIPROFLOXACIN HCL 500 MG PO TABS
500.0000 mg | ORAL_TABLET | Freq: Two times a day (BID) | ORAL | Status: DC
  Administered 2022-09-22 – 2022-09-23 (×2): 500 mg via ORAL
  Filled 2022-09-22 (×2): qty 1

## 2022-09-22 MED ORDER — POTASSIUM CHLORIDE 10 MEQ/100ML IV SOLN
10.0000 meq | INTRAVENOUS | Status: DC
  Administered 2022-09-22 (×2): 10 meq via INTRAVENOUS
  Filled 2022-09-22 (×4): qty 100

## 2022-09-22 MED ORDER — LOPERAMIDE HCL 2 MG PO CAPS
4.0000 mg | ORAL_CAPSULE | Freq: Four times a day (QID) | ORAL | Status: DC | PRN
  Administered 2022-09-22 – 2022-09-23 (×3): 4 mg via ORAL
  Filled 2022-09-22 (×3): qty 2

## 2022-09-22 MED ORDER — POTASSIUM PHOSPHATES 15 MMOLE/5ML IV SOLN
30.0000 mmol | Freq: Once | INTRAVENOUS | Status: DC
  Filled 2022-09-22: qty 10

## 2022-09-22 MED ORDER — K PHOS MONO-SOD PHOS DI & MONO 155-852-130 MG PO TABS
500.0000 mg | ORAL_TABLET | Freq: Four times a day (QID) | ORAL | Status: AC
  Administered 2022-09-22 (×2): 500 mg via ORAL
  Filled 2022-09-22 (×2): qty 2

## 2022-09-22 NOTE — Care Management Important Message (Signed)
Important Message  Patient Details  Name: Misty Page MRN: [id number] Date of Birth: 1940/08/12   Medicare Important Message Given:  Yes     Johnell Comings 09/22/2022, 10:40 AM

## 2022-09-22 NOTE — Progress Notes (Signed)
[facility] Progress Note  Patient: Misty Page    ZOX:[id number]  DOA: 09/19/2022     Date of Service: the patient was seen and examined on 09/22/2022  Chief Complaint  Patient presents with   Flank Pain   Brief hospital course: Misty Page is a 82 y.o. 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  CT renal stone protocol: 1. A 3 mm stone in the proximal right ureter causing mild hydroureteronephrosis. 2. Multiple nonobstructing stones of the both kidneys, the largest measuring 8 mm. Urology was consulted and patient was admitted for further management as below.  Assessment and Plan: # Right ureteral stone with infection, and hydroureteronephros and severe sepsis:  BP dropped, patient was given IV fluid bolus, Solu-Cortef. S/p ciprofloxacin 400 IV twice daily Urine consulted s/p Right retrograde pyelogram and Right ureteral stent placement Recommended to follow in 2 weeks for staged ureteroscopic intervention, follow-up will be arranged by urology. Ucx growing staph epi, pansensitive 4/24 patient lost IV access, ciprofloxacin changed to p.o. 500 mg p.o. twice daily, need to continue for 10 to 14 days as per urology.  Acute hypoxic respiratory failure due to pulmonary edema, Resolved  CXR: Bronchitic changes with scattered interstitial infiltrates bilaterally question pulmonary edema versus atypical infection. Abnormal fullness of LEFT pulmonary hilum, adenopathy/mass not excluded; either CT chest with contrast evaluation or radiographic follow-up until resolution recommended to exclude hilar adenopathy. Discontinued IV fluid S/p Lasix 40 mg IV twice daily, d/c'd on 4/23 DuoNeb every 6 hourly as needed Supplemental O2 inhalation has been weaned off, currently  patient is saturating well on room air  Hypokalemia secondary to diarrhea, patient has ulcerative colitis. Potassium repleted.  Patient last IV access so IV potassium changed to p.o. Monitor electrolytes and replete as needed.   CAD (coronary artery disease) -Aspirin -Hold Plavix in case patient need procedure -Crestor Aortic valve stenosis, murmur audible, TTE shows LVEF 65 to 70%, moderate pulmonary hypertension, left atrium moderately dilated, mild to moderate aortic valve stenosis  Essential (primary) hypertension -continue to hold Amlodipine and  Cozaar  Pt developed hypotension, s/p IV fluid bolus given Continue to monitor BP and titrate medications accordingly -IV hydralazine as needed   Hypercholesteremia -Crestor, Zetia   Hyperthyroidism -Methimazole   Ulcerative colitis -Continue home mesalamine   History of TIA (transient ischemic attack) -Aspirin, Crestor -Hold Plavix, resume when cleared by urology    Iron deficiency anemia: Hemoglobin stable 9.5 (7.6 on 07/23/2022) -Continue home iron supplement   Lung nodule: This is incidental finding by CT scan. -Follow-up with PCP as outpatient   Hypophosphatemia, Phos repleted. Hypomagnesemia, mag repleted. Monitor electrolytes and replete as needed.   Anemia of chronic disease and iron deficiency, transferrin saturation 2% hemoglobin 7.8 Folate within normal range, B12 within normal range Started oral iron supplement, avoided IV iron due to possible UTI Follow with PCP to repeat iron profile after 3 months 4/24 Hb 8.0, monitor H&H and transfuse if hemoglobin less than 7   Body mass index is 25.4 kg/m.  Interventions:      Diet: Heart Healthy diet DVT Prophylaxis: SCD, pharmacological prophylaxis contraindicated due to risk of bleeding    Advance goals of care discussion: DNR  Family Communication: family was present at bedside, at the time of interview.  The pt provided permission to discuss medical plan  with the family. Opportunity was given to ask question and all questions were answered satisfactorily.   Disposition:  Pt is from Home, admitted with Abd pain, found to have ureteral stone with obstruction, status post cystoscopy and stent insertion, s/p respiratory failure resolved after IV lasix, still has low Hb and low phos,  which precludes a safe discharge. Discharge to Home, when clinically stable, in 1 to 2 days  Subjective: No significant events overnight, patient developed diarrhea, 5-6 episodes yesterday through midnight and 2 episodes after waking up in the morning.  Denies any abdominal pain, no nausea vomiting.  Breathing is better now no shortness of breath.   Physical Exam: General: NAD, lying comfortably Appear in no distress, affect appropriate Eyes: PERRLA ENT: Oral Mucosa Clear, moist  Neck: no JVD,  Cardiovascular: S1 and S2 Present, Murmur audible,  Respiratory: Good air entry bilaterally, No crackles and no wheezing appreciated. Abdomen: Bowel Sound present, Soft and no tenderness,  Skin: no rashes Extremities: no Pedal edema, no calf tenderness Neurologic: without any new focal findings Gait not checked due to patient safety concerns  Vitals:   09/21/22 0738 09/21/22 1525 09/21/22 2313 09/22/22 0804  BP: (!) 149/73 120/62 119/70 139/71  Pulse: 97 89 86 87  Resp: 18 18 18 18   Temp: 98.1 F (36.7 C) 98.4 F (36.9 C) 98.5 F (36.9 C) 98.7 F (37.1 C)  TempSrc:   Oral   SpO2: 93% 93% 94% 92%  Weight:      Height:        Intake/Output Summary (Last 24 hours) at 09/22/2022 1647 Last data filed at 09/22/2022 1444 Gross per 24 hour  Intake 720 ml  Output 1400 ml  Net -680 ml   Filed Weights   09/19/22 0157 09/19/22 2200  Weight: 59 kg 63 kg    Data Reviewed: I have personally reviewed and interpreted daily labs, tele strips, imagings as discussed above. I reviewed all nursing notes, pharmacy notes, vitals, pertinent old records I have discussed plan  of care as described above with RN and patient/family.  CBC: Recent Labs  Lab 09/19/22 0200 09/20/22 0415 09/21/22 0414 09/21/22 1726 09/22/22 0613  WBC 16.0* 21.6* 15.0*  --  9.2  HGB 9.5* 7.8* 7.4* 8.4* 8.0*  HCT 32.9* 26.9* 25.6* 28.8* 27.0*  MCV 76.3* 77.7* 76.2*  --  75.4*  PLT 441* 310 319  --  367   Basic Metabolic Panel: Recent Labs  Lab 09/19/22 0200 09/20/22 0415 09/20/22 0912 09/21/22 0414 09/22/22 0613  NA 135 135  --  137 134*  K 3.8 4.0  --  3.5 2.8*  CL 103 109  --  107 103  CO2 18* 20*  --  23 25  GLUCOSE 157* 124*  --  136* 99  BUN 16 18  --  14 16  CREATININE 0.56 0.95  --  0.50 0.55  CALCIUM 9.0 7.2*  --  7.6* 7.5*  MG  --   --  1.6* 2.4 1.8  PHOS  --   --  2.5 1.8* 2.0*    Studies: No results found.  Scheduled Meds:  acidophilus  1 capsule Oral Daily   vitamin C  1,000 mg Oral q morning   aspirin EC  81 mg Oral q morning   calcium-vitamin D  1 tablet Oral Q breakfast   ciprofloxacin  500 mg Oral BID   ezetimibe  10 mg Oral Daily   famotidine  20 mg Oral BID   ferrous sulfate  325 mg Oral Q breakfast   iron polysaccharides  150 mg Oral Daily   Mesalamine  800 mg Oral TID   methIMAzole  2.5 mg Oral Once per day on Sun Tue Thu Sat   methimazole  5 mg Oral Once per day on Mon Wed Fri   phosphorus  500 mg Oral QID   potassium chloride  40 mEq Oral Once   potassium chloride  40 mEq Oral Once   rosuvastatin  10 mg Oral Daily   tamsulosin  0.4 mg Oral Daily   Continuous Infusions:   PRN Meds: acetaminophen, dextrose, fluticasone, ipratropium-albuterol, loperamide, meclizine, ondansetron (ZOFRAN) IV, oxyCODONE-acetaminophen  Time spent: 50 minutes  Author: Gillis Santa. MD [facility] 09/22/2022 4:47 PM  To reach On-call, see care teams to locate the attending and reach out to them via [email or web address]. If 7PM-7AM, please contact night-coverage If you still have difficulty reaching the attending provider, please page the [facility]  (Director on Call) for [facility] on amion for assistance.

## 2022-09-23 DIAGNOSIS — N201 Calculus of ureter: Secondary | ICD-10-CM | POA: Diagnosis not present

## 2022-09-23 LAB — BASIC METABOLIC PANEL
Anion gap: 9 (ref 5–15)
BUN: 10 mg/dL (ref 8–23)
CO2: 23 mmol/L (ref 22–32)
Calcium: 8 mg/dL — ABNORMAL LOW (ref 8.9–10.3)
Chloride: 106 mmol/L (ref 98–111)
Creatinine, Ser: 0.41 mg/dL — ABNORMAL LOW (ref 0.44–1.00)
GFR, Estimated: >60 mL/min (ref >60)
Glucose, Bld: 108 mg/dL — ABNORMAL HIGH (ref 70–99)
Potassium: 4.3 mmol/L (ref 3.5–5.1)
Sodium: 138 mmol/L (ref 135–145)

## 2022-09-23 LAB — CBC
HCT: 28.8 % — ABNORMAL LOW (ref 36.0–46.0)
Hemoglobin: 8.2 g/dL — ABNORMAL LOW (ref 12.0–15.0)
MCH: 22.2 pg — ABNORMAL LOW (ref 26.0–34.0)
MCHC: 28.5 g/dL — ABNORMAL LOW (ref 30.0–36.0)
MCV: 77.8 fL — ABNORMAL LOW (ref 80.0–100.0)
Platelets: 345 10*3/uL (ref 150–400)
RBC: 3.7 MIL/uL — ABNORMAL LOW (ref 3.87–5.11)
RDW: 19.9 % — ABNORMAL HIGH (ref 11.5–15.5)
WBC: 6 10*3/uL (ref 4.0–10.5)
nRBC: 0 % (ref 0.0–0.2)

## 2022-09-23 LAB — PHOSPHORUS: Phosphorus: 2.7 mg/dL (ref 2.5–4.6)

## 2022-09-23 LAB — MAGNESIUM: Magnesium: 1.8 mg/dL (ref 1.7–2.4)

## 2022-09-23 MED ORDER — TAMSULOSIN HCL 0.4 MG PO CAPS: 0.4000 mg | ORAL_CAPSULE | Freq: Every day | ORAL | 0 refills | Status: AC

## 2022-09-23 MED ORDER — HYDRALAZINE HCL 20 MG/ML IJ SOLN: 10.0000 mg | Freq: Four times a day (QID) | INTRAMUSCULAR | Status: DC | PRN

## 2022-09-23 MED ORDER — POLYSACCHARIDE IRON COMPLEX 150 MG PO CAPS: 150.0000 mg | ORAL_CAPSULE | Freq: Every day | ORAL | 0 refills | Status: DC

## 2022-09-23 MED ORDER — AMLODIPINE BESYLATE 5 MG PO TABS
5.0000 mg | ORAL_TABLET | Freq: Every day | ORAL | Status: DC
  Administered 2022-09-23: 5 mg via ORAL
  Filled 2022-09-23: qty 1

## 2022-09-23 MED ORDER — METHIMAZOLE 2.5 MG HALF TABLET
2.5000 mg | ORAL_TABLET | ORAL | Status: DC
  Administered 2022-09-23: 2.5 mg via ORAL
  Filled 2022-09-23: qty 1

## 2022-09-23 MED ORDER — LOSARTAN POTASSIUM 50 MG PO TABS
100.0000 mg | ORAL_TABLET | Freq: Every day | ORAL | Status: DC
  Administered 2022-09-23: 100 mg via ORAL
  Filled 2022-09-23: qty 2

## 2022-09-23 MED ORDER — OXYBUTYNIN CHLORIDE ER 5 MG PO TB24: 10.0000 mg | ORAL_TABLET | Freq: Three times a day (TID) | ORAL | 0 refills | Status: DC | PRN

## 2022-09-23 MED ORDER — HYDRALAZINE HCL 50 MG PO TABS: 50.0000 mg | ORAL_TABLET | Freq: Four times a day (QID) | ORAL | Status: DC | PRN

## 2022-09-23 MED ORDER — CIPROFLOXACIN HCL 500 MG PO TABS: 500.0000 mg | ORAL_TABLET | Freq: Two times a day (BID) | ORAL | 0 refills | Status: AC

## 2022-09-23 NOTE — Discharge Summary (Addendum)
[facility] Discharge Summary   Patient: Misty Page ZOX:[id number]  PCP: Erasmo Downer, MD  Date of admission: 09/19/2022   Date of discharge: 09/23/2022 09/23/2022     Discharge Diagnoses:  Principal Problem:   Right ureteral stone Active Problems:   Hydroureteronephrosis   Severe sepsis   CAD (coronary artery disease)   Essential (primary) hypertension   Hypercholesteremia   Hyperthyroidism   Ulcerative colitis   History of TIA (transient ischemic attack)   Iron deficiency anemia   Lung nodule   Admitted From: Home Disposition:  Home   Recommendations for Outpatient Follow-up:  Follow-up with PCP in 1 week Follow-up with urology in 2 weeks Follow up LABS/TEST:     Diet recommendation: Cardiac diet  Activity: The patient is advised to gradually reintroduce usual activities, as tolerated  Discharge Condition: stable  Code Status: Full code   History of present illness: As per the H and P dictated on admission Hospital Course:  Misty Page is a 82 y.o. 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  CT renal stone protocol: 1. A 3 mm stone in the proximal right ureter causing mild hydroureteronephrosis. 2. Multiple nonobstructing stones of the both kidneys, the largest measuring 8 mm. Urology was consulted and patient was admitted for further management as below Assessment and Plan:  # Right ureteral stone with infection, and hydroureteronephros and severe sepsis:  BP dropped, patient was given IV fluid bolus, Solu-Cortef. S/p ciprofloxacin 400 IV twice daily Urine consulted s/p Right retrograde pyelogram and Right ureteral stent placement Recommended to follow in 2 weeks for staged ureteroscopic intervention, follow-up will be arranged by  urology. Ucx growing staph epi, pansensitive. On 4/24 patient lost IV access, ciprofloxacin changed to p.o. 500 mg p.o. twice daily.  Patient was discharged on ciprofloxacin 500 twice daily for patient all 10 days on discharge.  Started Flomax 0.4 mg p.o. daily and oxybutynin 3 times daily as needed as per urology. Follow with urology in 2 to 3 weeks   # Acute hypoxic respiratory failure due to pulmonary edema, Resolved  CXR: Bronchitic changes with scattered interstitial infiltrates bilaterally question pulmonary edema versus atypical infection. Abnormal fullness of LEFT pulmonary hilum, adenopathy/mass not excluded; either CT chest with contrast evaluation or radiographic follow-up until resolution recommended to exclude hilar adenopathy. Discontinued IV fluid. S/p Lasix 40 mg IV twice daily, d/c'd on 4/23, s/p DuoNeb every 6 hourly as needed and s/p Supplemental O2 inhalation has been weaned off, currently patient is saturating well on room air   # Hypokalemia secondary to diarrhea, patient has ulcerative colitis. Potassium repleted.  Patient lost IV access so IV potassium changed to p.o. hypokalemia resolved, potassium level is within normal range today. # CAD (coronary artery disease), continued aspirin, Plavix was held due to procedure.  Resumed on discharge.  Continue Crestor  # Aortic valve stenosis, murmur audible, TTE shows LVEF 65 to 70%, moderate pulmonary hypertension, left atrium moderately dilated, mild to moderate aortic valve stenosis # Essential (primary) hypertension, patient developed hypotension so amlodipine and Cozaar were held.  Received IV fluid.  BP improved.  Resumed home medications on discharge.  Patient was advised to monitor BP at home and follow with PCP for further management. # Hypercholesteremia, Crestor, Zetia # Hyperthyroidism, Methimazole # Ulcerative colitis, Continue home mesalamine # History of TIA (  transient ischemic attack), continue aspirin, Plavix and  Crestor # Iron deficiency anemia: Hb 8.2, started oral iron supplement, continue to follow with PCP repeat iron profile after 3 to 6 months.  If no improvement then patient may need IV iron infusion as an outpatient. # Lung nodule: This is incidental finding by CT scan. Follow-up with PCP as outpatient # Hypophosphatemia, Phos repleted.  Resolved # Hypomagnesemia, mag repleted.  Resolved # Anemia of chronic disease and iron deficiency, transferrin saturation 2% hemoglobin 7.8 Folate within normal range, B12 within normal range. Started oral iron supplement, avoided IV iron due to possible UTI. Follow with PCP to repeat iron profile after 3 months 4/25 Hb 8.2 today, stable  Body mass index is 25.4 kg/m.  Nutrition Interventions:   - Patient was instructed, not to drive, operate heavy machinery, perform activities at heights, swimming or participation in water activities or provide baby sitting services while on Pain, Sleep and Anxiety Medications; until her outpatient Physician has advised to do so again.  - Also recommended to not to take more than prescribed Pain, Sleep and Anxiety Medications.  Patient was ambulatory without any assistance. On the day of the discharge the patient's vitals were stable, and no other acute medical condition were reported by patient. the patient was felt safe to be discharge at Home.  Consultants: Urology Procedures: s/p Right retrograde pyelogram and Right ureteral stent placement  Discharge Exam: General: Appear in no distress, no Rash; Oral Mucosa Clear, moist. Cardiovascular: S1 and S2 Present, no Murmur, Respiratory: normal respiratory effort, Bilateral Air entry present and no Crackles, no wheezes Abdomen: Bowel Sound present, Soft and no tenderness, no hernia Extremities: no Pedal edema, no calf tenderness Neurology: alert and oriented to time, place, and person affect appropriate.  Filed Weights   09/19/22 0157 09/19/22 2200  Weight: 59 kg 63  kg   Vitals:   09/23/22 0353 09/23/22 0915  BP: (!) 176/76 (!) 176/75  Pulse: 99 90  Resp: 20   Temp: 97.7 F (36.5 C) 98 F (36.7 C)  SpO2: 97% 100%    DISCHARGE MEDICATION: Allergies as of 09/23/2022       Reactions   Amoxicillin Anaphylaxis   Cephalosporins Swelling, Rash   Lip swelling and rash on CTX and Vancomycin.   Naproxen Hives, Shortness Of Breath, Nausea And Vomiting, Swelling   Penicillins Shortness Of Breath, Swelling, Rash   Tolerated ceftriaxone 08/2022   Codeine Nausea And Vomiting   Levofloxacin Nausea And Vomiting   Shellfish Allergy Hives, Diarrhea, Nausea And Vomiting, Swelling   Vancomycin Swelling, Rash   Occurred while on CTX and Vancomycin. More likely CTX as she has anaphylaxis to PCN, but should monitor closely.        Medication List     STOP taking these medications    ferrous sulfate 325 (65 FE) MG tablet       TAKE these medications    acetaminophen 500 MG tablet Commonly known as: TYLENOL Take 500-1,000 mg by mouth every 8 (eight) hours as needed.   amLODipine 5 MG tablet Commonly known as: NORVASC Take 1 tablet (5 mg total) by mouth daily.   Bayer Low Dose 81 MG tablet Generic drug: aspirin EC TAKE 1 TABLET BY MOUTH EVERY MORNING   Calcium Carb-Cholecalciferol 600-20 MG-MCG Tabs Commonly known as: Calcium+D3 Take 1 tablet by mouth daily.   ciprofloxacin 500 MG tablet Commonly known as: CIPRO Take 1 tablet (500 mg total) by mouth 2 (two) times daily for 10  days.   clopidogrel 75 MG tablet Commonly known as: PLAVIX TAKE 1 TABLET BY MOUTH DAILY   ezetimibe 10 MG tablet Commonly known as: ZETIA Take 1 tablet (10 mg total) by mouth daily.   famotidine 20 MG tablet Commonly known as: PEPCID Take 1 tablet (20 mg total) by mouth 2 (two) times daily.   fluticasone 50 MCG/ACT nasal spray Commonly known as: FLONASE USE 2 SPRAYS EACH NOSTRIL DAILY What changed:  how much to take how to take this when to take  this reasons to take this additional instructions   iron polysaccharides 150 MG capsule Commonly known as: NIFEREX Take 1 capsule (150 mg total) by mouth daily. Start taking on: September 24, 2022   losartan 100 MG tablet Commonly known as: COZAAR TAKE 1 TABLET BY MOUTH DAILY   meclizine 25 MG tablet Commonly known as: ANTIVERT Take 1 tablet (25 mg total) by mouth 3 (three) times daily as needed for dizziness or nausea.   Mesalamine 800 MG Tbec Take 800 mg by mouth in the morning, at noon, and at bedtime.   methimazole 5 MG tablet Commonly known as: TAPAZOLE Take 5 mg by mouth. 1 Tablet on Mondays, Wednesdays and Fridays and take 1/2 tablet daily the rest of the week.   oxybutynin 5 MG 24 hr tablet Commonly known as: Ditropan XL Take 2 tablets (10 mg total) by mouth 3 (three) times daily as needed (for Pain due to for stent discomfort).   PROBIOTIC DAILY PO Take 1 capsule by mouth daily.   Probiotic-Prebiotic 1-250 BILLION-MG Caps TAKE 1 CAPSULE BY MOUTH DAILY   rosuvastatin 20 MG tablet Commonly known as: CRESTOR TAKE ONE-HALF TABLET BY MOUTH DAILY   tamsulosin 0.4 MG Caps capsule Commonly known as: FLOMAX Take 1 capsule (0.4 mg total) by mouth daily. Start taking on: September 24, 2022   vitamin C 1000 MG tablet TAKE 1 TABLET BY MOUTH EVERY MORNING       Allergies  Allergen Reactions   Amoxicillin Anaphylaxis   Cephalosporins Swelling and Rash    Lip swelling and rash on CTX and Vancomycin.   Naproxen Hives, Shortness Of Breath, Nausea And Vomiting and Swelling   Penicillins Shortness Of Breath, Swelling and Rash    Tolerated ceftriaxone 08/2022   Codeine Nausea And Vomiting   Levofloxacin Nausea And Vomiting   Shellfish Allergy Hives, Diarrhea, Nausea And Vomiting and Swelling   Vancomycin Swelling and Rash    Occurred while on CTX and Vancomycin. More likely CTX as she has anaphylaxis to PCN, but should monitor closely.   Discharge Instructions     Call MD  for:  difficulty breathing, headache or visual disturbances   Complete by: As directed    Call MD for:  extreme fatigue   Complete by: As directed    Call MD for:  persistant dizziness or light-headedness   Complete by: As directed    Call MD for:  persistant nausea and vomiting   Complete by: As directed    Call MD for:  severe uncontrolled pain   Complete by: As directed    Call MD for:  temperature >100.4   Complete by: As directed    Diet - low sodium heart healthy   Complete by: As directed    Discharge instructions   Complete by: As directed    Follow-up with PCP in 1 week Follow-up with urology in 2 weeks.   Increase activity slowly   Complete by: As directed  The results of significant diagnostics from this hospitalization (including imaging, microbiology, ancillary and laboratory) are listed below for reference.    Significant Diagnostic Studies: ECHOCARDIOGRAM COMPLETE  Result Date: 09/21/2022    ECHOCARDIOGRAM REPORT   Patient Name:   Misty Page Date of Exam: 09/21/2022 Medical Rec #:  [id number]     Height:       62.0 in Accession #:    [id number]    Weight:       138.9 lb Date of Birth:  July 20, 1940     BSA:          1.637 m Patient Age:    81 years      BP:           149/73 mmHg Patient Gender: F             HR:           96 bpm. Exam Location:  [facility] Procedure: 2D Echo, Cardiac Doppler and Color Doppler Indications:     Murmur  History:         Patient has no prior history of Echocardiogram examinations.                  CAD and Previous Myocardial Infarction, TIA,                  Signs/Symptoms:Murmur; Risk Factors:Hypertension.  Sonographer:     Mikki Harbor Referring Phys:  [id number] Gillis Santa Diagnosing Phys: Arvilla Meres MD  Sonographer Comments: Technically difficult study due to poor echo windows. Image acquisition challenging due to breast implants. IMPRESSIONS  1. Left ventricular ejection fraction, by estimation, is 65 to 70%. The left ventricle  has normal function. The left ventricle has no regional wall motion abnormalities. Left ventricular diastolic parameters were normal.  2. Right ventricular systolic function is normal. The right ventricular size is normal. There is moderately elevated pulmonary artery systolic pressure.  3. Left atrial size was moderately dilated.  4. Right atrial size was mildly dilated.  5. The mitral valve is normal in structure. Mild mitral valve regurgitation. No evidence of mitral stenosis.  6. The aortic valve is tricuspid. There is moderate calcification of the aortic valve. Aortic valve regurgitation is mild. Mild to moderate aortic valve stenosis. Aortic valve area, by VTI measures 1.58 cm. Aortic valve mean gradient measures 17.5 mmHg. Aortic valve Vmax measures 2.96 m/s. Visually AS appears more in the moderate range.  7. The inferior vena cava is normal in size with greater than 50% respiratory variability, suggesting right atrial pressure of 3 mmHg. FINDINGS  Left Ventricle: Left ventricular ejection fraction, by estimation, is 65 to 70%. The left ventricle has normal function. The left ventricle has no regional wall motion abnormalities. The left ventricular internal cavity size was normal in size. There is  no left ventricular hypertrophy. Left ventricular diastolic parameters were normal. Right Ventricle: The right ventricular size is normal. No increase in right ventricular wall thickness. Right ventricular systolic function is normal. There is moderately elevated pulmonary artery systolic pressure. The tricuspid regurgitant velocity is 3.37 m/s, and with an assumed right atrial pressure of 8 mmHg, the estimated right ventricular systolic pressure is 53.4 mmHg. Left Atrium: Left atrial size was moderately dilated. Right Atrium: Right atrial size was mildly dilated. Pericardium: There is no evidence of pericardial effusion. Mitral Valve: The mitral valve is normal in structure. Mild mitral valve regurgitation. No  evidence of mitral valve stenosis. MV peak gradient, 5.6 mmHg.  The mean mitral valve gradient is 3.0 mmHg. Tricuspid Valve: The tricuspid valve is normal in structure. Tricuspid valve regurgitation is mild . No evidence of tricuspid stenosis. Aortic Valve: The aortic valve is tricuspid. There is moderate calcification of the aortic valve. Aortic valve regurgitation is mild. Mild to moderate aortic stenosis is present. Aortic valve mean gradient measures 17.5 mmHg. Aortic valve peak gradient measures 35.0 mmHg. Aortic valve area, by VTI measures 1.58 cm. Pulmonic Valve: The pulmonic valve was normal in structure. Pulmonic valve regurgitation is trivial. No evidence of pulmonic stenosis. Aorta: The aortic root is normal in size and structure. Venous: The inferior vena cava is normal in size with greater than 50% respiratory variability, suggesting right atrial pressure of 3 mmHg. IAS/Shunts: The interatrial septum appears to be lipomatous. No atrial level shunt detected by color flow Doppler.  LEFT VENTRICLE PLAX 2D LVIDd:         4.20 cm   Diastology LVIDs:         2.30 cm   LV e' medial:    9.46 cm/s LV PW:         1.30 cm   LV E/e' medial:  11.6 LV IVS:        1.40 cm   LV e' lateral:   11.60 cm/s LVOT diam:     2.10 cm   LV E/e' lateral: 9.5 LV SV:         99 LV SV Index:   61 LVOT Area:     3.46 cm  RIGHT VENTRICLE RV Basal diam:  3.35 cm RV Mid diam:    3.50 cm RV S prime:     13.30 cm/s TAPSE (M-mode): 2.0 cm LEFT ATRIUM             Index        RIGHT ATRIUM           Index LA diam:        4.40 cm 2.69 cm/m   RA Area:     16.50 cm LA Vol (A2C):   84.2 ml 51.43 ml/m  RA Volume:   44.50 ml  27.18 ml/m LA Vol (A4C):   54.7 ml 33.41 ml/m LA Biplane Vol: 68.7 ml 41.96 ml/m  AORTIC VALVE                     PULMONIC VALVE AV Area (Vmax):    1.53 cm      PV Vmax:       1.32 m/s AV Area (Vmean):   1.49 cm      PV Peak grad:  7.0 mmHg AV Area (VTI):     1.58 cm AV Vmax:           296.00 cm/s AV Vmean:           186.500 cm/s AV VTI:            0.631 m AV Peak Grad:      35.0 mmHg AV Mean Grad:      17.5 mmHg LVOT Vmax:         131.00 cm/s LVOT Vmean:        80.200 cm/s LVOT VTI:          0.287 m LVOT/AV VTI ratio: 0.45  AORTA Ao Root diam: 3.80 cm Ao Asc diam:  3.70 cm MITRAL VALVE                TRICUSPID VALVE MV Area (PHT): 3.85  cm     TR Peak grad:   45.4 mmHg MV Area VTI:   3.79 cm     TR Vmax:        337.00 cm/s MV Peak grad:  5.6 mmHg MV Mean grad:  3.0 mmHg     SHUNTS MV Vmax:       1.18 m/s     Systemic VTI:  0.29 m MV Vmean:      84.8 cm/s    Systemic Diam: 2.10 cm MV Decel Time: 197 msec MV E velocity: 110.00 cm/s MV A velocity: 112.00 cm/s MV E/A ratio:  0.98 Arvilla Meres MD Electronically signed by Arvilla Meres MD Signature Date/Time: 09/21/2022/1:03:59 PM    Final    DG Chest Port 1 View  Result Date: 09/20/2022 CLINICAL DATA:  Shortness of breath EXAM: PORTABLE CHEST 1 VIEW COMPARISON:  Portable exam 1242 hours compared to 12/16/2021 FINDINGS: Normal heart size, mediastinal contours, and pulmonary vascularity. Abnormal fullness of LEFT pulmonary hilum. Atherosclerotic calcification aorta. Peribronchial thickening with scattered interstitial infiltrates bilaterally which could represent pulmonary edema or atypical infection. Probable small LEFT pleural effusion. No pneumothorax or acute osseous findings. Diffuse osseous demineralization. IMPRESSION: Bronchitic changes with scattered interstitial infiltrates bilaterally question pulmonary edema versus atypical infection. Abnormal fullness of LEFT pulmonary hilum, adenopathy/mass not excluded; either CT chest with contrast evaluation or radiographic follow-up until resolution recommended to exclude hilar adenopathy. Electronically Signed   By: Ulyses Southward M.D.   On: 09/20/2022 12:57   DG OR UROLOGY CYSTO IMAGE ([facility] ONLY)  Result Date: 09/20/2022 There is no interpretation for this exam.  This order is for images obtained during a surgical  procedure.  Please See "Surgeries" Tab for more information regarding the procedure.   DG Abd 1 View  Result Date: 09/19/2022 CLINICAL DATA:  Kidney stone EXAM: ABDOMEN - 1 VIEW COMPARISON:  X-ray 07/21/2009.  CT scan 09/19/2022 without contrast FINDINGS: Gas seen in nondilated loops of small and large bowel with scattered colonic stool. Gas and stool are seen overlying the reniform shadows. There is calcification overlying the upper pole of the right kidney consistent known renal stone as seen on CT. Other tiny foci are not seen in their entirety on this x-ray. The stone in the proximal right ureter at the level of the L3 by CT is not clearly seen on the current x-ray but there again is significant overlapping bowel gas and stool in this location scattered vascular calcifications are identified as well. IMPRESSION: Renal stones are again seen as on prior CT scan. The stone in the proximal right ureter is not clearly seen on this x-ray with overlapping bowel gas and soft tissue. Nonspecific bowel gas pattern. Electronically Signed   By: Karen Kays M.D.   On: 09/19/2022 20:54   CT Renal Stone Study  Result Date: 09/19/2022 CLINICAL DATA:  Right flank pain EXAM: CT ABDOMEN AND PELVIS WITHOUT CONTRAST TECHNIQUE: Multidetector CT imaging of the abdomen and pelvis was performed following the standard protocol without IV contrast. RADIATION DOSE REDUCTION: This exam was performed according to the departmental dose-optimization program which includes automated exposure control, adjustment of the mA and/or kV according to patient size and/or use of iterative reconstruction technique. COMPARISON:  None Available. FINDINGS: Lower Chest: 4 mm nodule in the right middle lobe (4:15). Mild lingular atelectasis. Hepatobiliary: Normal hepatic contours. No intra- or extrahepatic biliary dilatation. The gallbladder is normal. Pancreas: Normal pancreas. No ductal dilatation or peripancreatic fluid collection. Spleen: Normal.  Adrenals/Urinary Tract: The  adrenal glands are normal. There is a 3 mm stone in the proximal right ureter causing mild hydroureteronephrosis. There are multiple nonobstructing stones of the right kidney, the largest measuring 8 mm. Multiple nonobstructing left renal calcifications. 3.5 cm [place] class 1 left renal cyst, no follow-up needed. The urinary bladder is normal for degree of distention Stomach/Bowel: There is no hiatal hernia. Normal duodenal course and caliber. No small bowel dilatation or inflammation. Rectosigmoid diverticulosis without acute inflammation. Normal appendix. Vascular/Lymphatic: There is calcific atherosclerosis of the abdominal aorta. No lymphadenopathy. Reproductive: Normal uterus. No adnexal mass. Other: None. Musculoskeletal: Absent left femur IMPRESSION: 1. A 3 mm stone in the proximal right ureter causing mild hydroureteronephrosis. 2. Multiple nonobstructing stones of the both kidneys, the largest measuring 8 mm. 3. Right solid pulmonary nodule measuring 4 mm. Per Fleischner Society Guidelines, no routine follow-up imaging is recommended. These guidelines do not apply to immunocompromised patients and patients with cancer. Follow up in patients with significant comorbidities as clinically warranted. For lung cancer screening, adhere to Lung-RADS guidelines. Reference: Radiology. 2017; 284(1):228-43. 4. Rectosigmoid diverticulosis without acute inflammation. Aortic Atherosclerosis (ICD10-I70.0). Electronically Signed   By: Deatra Robinson M.D.   On: 09/19/2022 03:33    Microbiology: Recent Results (from the past 240 hour(s))  Urine Culture     Status: Abnormal   Collection Time: 09/19/22  5:08 AM   Specimen: Urine, Clean Catch  Result Value Ref Range Status   Specimen Description   Final    URINE, CLEAN CATCH Performed at [facility], [address] [place], [name] [address]    Special Requests   Final    NONE Performed at [facility], [address] [place], [name] [address]    Culture >=100,000 COLONIES/mL STAPHYLOCOCCUS EPIDERMIDIS (A)  Final   Report Status 09/21/2022 FINAL  Final   Organism ID, Bacteria STAPHYLOCOCCUS EPIDERMIDIS (A)  Final      Susceptibility   Staphylococcus epidermidis - MIC*    CIPROFLOXACIN <=0.5 SENSITIVE Sensitive     GENTAMICIN <=0.5 SENSITIVE Sensitive     NITROFURANTOIN <=16 SENSITIVE Sensitive     OXACILLIN <=0.25 SENSITIVE Sensitive     TETRACYCLINE 2 SENSITIVE Sensitive     VANCOMYCIN 1 SENSITIVE Sensitive     TRIMETH/SULFA <=10 SENSITIVE Sensitive     CLINDAMYCIN 0.5 SENSITIVE Sensitive     RIFAMPIN <=0.5 SENSITIVE Sensitive     Inducible Clindamycin NEGATIVE Sensitive     * >=100,000 COLONIES/mL STAPHYLOCOCCUS EPIDERMIDIS  MRSA Next Gen by PCR, Nasal     Status: None   Collection Time: 09/20/22 12:10 AM   Specimen: Nasal Mucosa; Nasal Swab  Result Value Ref Range Status   MRSA by PCR Next Gen NOT DETECTED NOT DETECTED Final    Comment: (NOTE) The GeneXpert MRSA Assay (FDA approved for NASAL specimens only), is one component of a comprehensive MRSA colonization surveillance program. It is not intended to diagnose MRSA infection nor to guide or monitor treatment for MRSA infections. Test performance is not FDA approved in patients less than 46 years old. Performed at [facility], [address] [place], [name] [address]      Labs: CBC: Recent Labs  Lab 09/19/22 0200 09/20/22 0415 09/21/22 0414 09/21/22 1726 09/22/22 0613 09/23/22 0550  WBC 16.0* 21.6* 15.0*  --  9.2 6.0  HGB 9.5* 7.8* 7.4* 8.4* 8.0* 8.2*  HCT 32.9* 26.9* 25.6* 28.8* 27.0* 28.8*  MCV 76.3* 77.7* 76.2*  --  75.4* 77.8*  PLT 441* 310  319  --  367 345   Basic Metabolic Panel: Recent Labs  Lab 09/19/22 0200 09/20/22 0415 09/20/22 0912 09/21/22 0414 09/22/22 0613 09/23/22 0550  NA 135 135  --  137 134* 138  K 3.8 4.0  --  3.5 2.8* 4.3  CL 103 109  --  107 103 106  CO2 18* 20*  --  23 25  23   GLUCOSE 157* 124*  --  136* 99 108*  BUN 16 18  --  14 16 10   CREATININE 0.56 0.95  --  0.50 0.55 0.41*  CALCIUM 9.0 7.2*  --  7.6* 7.5* 8.0*  MG  --   --  1.6* 2.4 1.8 1.8  PHOS  --   --  2.5 1.8* 2.0* 2.7   Liver Function Tests: Recent Labs  Lab 09/19/22 0200  AST 26  ALT 15  ALKPHOS 62  BILITOT 0.3  PROT 7.2  ALBUMIN 4.1   Recent Labs  Lab 09/19/22 0200  LIPASE 37   No results for input(s): "AMMONIA" in the last 168 hours. Cardiac Enzymes: No results for input(s): "CKTOTAL", "CKMB", "CKMBINDEX", "TROPONINI" in the last 168 hours. BNP (last 3 results) No results for input(s): "BNP" in the last 8760 hours. CBG: Recent Labs  Lab 09/19/22 2018 09/19/22 2331 09/20/22 0350 09/20/22 1023  GLUCAP 123* 147* 103* 119*    Time spent: 35 minutes  Signed:  Gillis Santa  [facility] 09/23/2022 09/23/2022 4:23 PM

## 2022-09-24 ENCOUNTER — Other Ambulatory Visit: Payer: Self-pay | Admitting: Family Medicine

## 2022-09-24 ENCOUNTER — Telehealth: Payer: Self-pay

## 2022-09-24 ENCOUNTER — Telehealth: Payer: Self-pay | Admitting: *Deleted

## 2022-09-24 DIAGNOSIS — I10 Essential (primary) hypertension: Secondary | ICD-10-CM

## 2022-09-24 NOTE — Telephone Encounter (Signed)
Requested medications are due for refill today.  yes  Requested medications are on the active medications list.  yes  Last refill. 04/20/2022 for both  Future visit scheduled.   yes  Notes to clinic.  Abnormal labs.    Requested Prescriptions  Pending Prescriptions Disp Refills   losartan (COZAAR) 100 MG tablet [Pharmacy Med Name: LOSARTAN POTASSIUM 100 MG TAB] 90 tablet 1    Sig: TAKE 1 TABLET BY MOUTH DAILY     Cardiovascular:  Angiotensin Receptor Blockers Failed - 09/24/2022  4:03 PM      Failed - Cr in normal range and within 180 days    Creatinine  Date Value Ref Range Status  06/22/2014 0.65 0.60 - 1.30 mg/dL Final   Creatinine, Ser  Date Value Ref Range Status  09/23/2022 0.41 (L) 0.44 - 1.00 mg/dL Final         Failed - Last BP in normal range    BP Readings from Last 1 Encounters:  09/23/22 (!) 176/75         Passed - K in normal range and within 180 days    Potassium  Date Value Ref Range Status  09/23/2022 4.3 3.5 - 5.1 mmol/L Final  06/22/2014 3.5 3.5 - 5.1 mmol/L Final         Passed - Patient is not pregnant      Passed - Valid encounter within last 6 months    Recent Outpatient Visits           2 months ago Encounter for annual physical exam   [place] [facility] [place], Marzella Schlein, MD   9 months ago Leg swelling   [place] [facility] [place], [place], [name]   9 months ago Leg swelling   [facility] [place], [place], [name]   10 months ago Pain and swelling of right lower leg   [place] [facility] Bosie Clos, MD   1 year ago Essential (primary) hypertension   [place] [facility] [place], Marzella Schlein, MD       Future Appointments             In 4 days [name], Marzella Schlein, MD  [facility]             clopidogrel (PLAVIX) 75 MG tablet [Pharmacy Med Name: CLOPIDOGREL BISULFATE 75 MG TAB] 90 tablet 1     Sig: TAKE 1 TABLET BY MOUTH DAILY     Hematology: Antiplatelets - clopidogrel Failed - 09/24/2022  4:03 PM      Failed - HCT in normal range and within 180 days    HCT  Date Value Ref Range Status  09/23/2022 28.8 (L) 36.0 - 46.0 % Final   Hematocrit  Date Value Ref Range Status  07/23/2022 26.9 (L) 34.0 - 46.6 % Final         Failed - HGB in normal range and within 180 days    Hemoglobin  Date Value Ref Range Status  09/23/2022 8.2 (L) 12.0 - 15.0 g/dL Final    Comment:    Reticulocyte Hemoglobin testing may be clinically indicated, consider ordering this additional test [id number]   07/23/2022 7.6 (L) 11.1 - 15.9 g/dL Final         Failed - Cr in normal range and within 360 days    Creatinine  Date Value Ref Range Status  06/22/2014 0.65 0.60 - 1.30 mg/dL Final   Creatinine, Ser  Date Value Ref Range Status  09/23/2022 0.41 (L) 0.44 - 1.00 mg/dL Final         Passed - PLT in normal range and within 180 days    Platelets  Date Value Ref Range Status  09/23/2022 345 150 - 400 K/uL Final  07/23/2022 390 150 - 450 x10E3/uL Final         Passed - Valid encounter within last 6 months    Recent Outpatient Visits           2 months ago Encounter for annual physical exam   [facility] [place], Marzella Schlein, MD   9 months ago Leg swelling   [place] [facility] [place], [place], [place]   9 months ago Leg swelling   [facility] [place], [place], [name]   10 months ago Pain and swelling of right lower leg   [facility] Bosie Clos, MD   1 year ago Essential (primary) hypertension   [facility] [place], Marzella Schlein, MD       Future Appointments             In 4 days [name], Marzella Schlein, MD [facility], [facility]

## 2022-09-24 NOTE — Transitions of Care (Post Inpatient/ED Visit) (Signed)
   09/24/2022  Name: Misty Page MRN: [id number] DOB: 09-Apr-1941  Today's TOC FU Call Status: Today's TOC FU Call Status:: Successful TOC FU Call Competed TOC FU Call Complete Date: 09/24/22  Transition Care Management Follow-up Telephone Call Date of Discharge: 09/22/22 Discharge Facility: [facility]) Type of Discharge: Inpatient Admission Primary Inpatient Discharge Diagnosis:: Right ureteral stone How have you been since you were released from the hospital?: Better Any questions or concerns?: No  Items Reviewed: Did you receive and understand the discharge instructions provided?: Yes Medications obtained and verified?: Yes (Medications Reviewed) Any new allergies since your discharge?: No Dietary orders reviewed?: No Do you have support at home?: Yes People in Home: alone Name of Support/Comfort Primary Source: Misty Page daughter  Home Care and Equipment/Supplies: Were Home Health Services Ordered?: No Any new equipment or medical supplies ordered?: No  Functional Questionnaire: Do you need assistance with bathing/showering or dressing?: No Do you need assistance with meal preparation?: No Do you need assistance with eating?: No Do you have difficulty maintaining continence: No Do you need assistance with getting out of bed/getting out of a chair/moving?: No Do you have difficulty managing or taking your medications?: No  Follow up appointments reviewed: PCP Follow-up appointment confirmed?: Yes Date of PCP follow-up appointment?: 09/28/22 Follow-up Provider: Erasmo Downer, MD [facility] Follow-up appointment confirmed?: No Reason Specialist Follow-Up Not Confirmed: Patient has Specialist Provider Number and will Call for Appointment Do you need transportation to your follow-up appointment?: No Do you understand care options if your condition(s) worsen?: Yes-patient verbalized understanding  SDOH Interventions Today     Flowsheet Row Most Recent Value  SDOH Interventions   Food Insecurity Interventions Intervention Not Indicated  Housing Interventions Intervention Not Indicated  Transportation Interventions Intervention Not Indicated      Interventions Today    Flowsheet Row Most Recent Value  General Interventions   General Interventions Discussed/Reviewed General Interventions Discussed, General Interventions Reviewed, Doctor Visits  Doctor Visits Discussed/Reviewed Doctor Visits Discussed, Doctor Visits Reviewed  Pharmacy Interventions   Pharmacy Dicussed/Reviewed Pharmacy Topics Discussed, Pharmacy Topics Reviewed      TOC Interventions Today    Flowsheet Row Most Recent Value  TOC Interventions   TOC Interventions Discussed/Reviewed Arranged PCP follow up within 7 days/Care Guide scheduled  [RN gave Patient daughter Misty Page number for urologist to schedule follow up appt.]       Gean Maidens BSN RN [facility] [phone]

## 2022-09-24 NOTE — Progress Notes (Signed)
   [facility] Surgical Posting Form  Surgery Date: Date: 10/07/2022  Surgeon: Dr. [name], MD  Inpt ( No  )   Outpt (Yes)   Obs ( No  )   Diagnosis: N20.1 Right Ureteral Stone  -CPT: [phone]  Surgery: Right Ureteroscopy with Laser Lithotripsy and Stent Exchange  Stop Anticoagulations: Yes needs to hold Plavix, may continue ASA  Cardiac/Medical/Pulmonary Clearance needed: no  Clearance needed from Dr: Beryle Flock, MD  Clearance request sent on: Date: 09/24/22  *Orders entered into EPIC  Date: 09/24/22   *Case booked in EPIC  Date: 09/24/22  *Notified pt of Surgery: Date: 09/24/22  PRE-OP UA & CX: no  *Placed into Prior Authorization Work [place] Date: 09/24/22  Assistant/laser/rep:No

## 2022-09-24 NOTE — Consult Note (Addendum)
   [name] Inpatient Consult   09/24/2022  Misty Page 1940-12-20 [id number]    Late Entry Location: [facility] RN Hospital Liaison screened remotely [facility]).   [facility] Customer service manager [facility]) Accountable Care Organization [ACO] Patient: [name])     Primary Care Provider:  Erasmo Downer, MD [facility]  Patient is currently active with [facility] [[facility]] Care Management for chronic disease management services.  Patient has been engaged by a [facility].  Our community based plan of care has focused on disease management and community resource support.   Patient will receive a post hospital call and will be evaluated for assessments and disease process education.   Plan: Discharged home.  Inpatient Transition Of Care [TOC] team member to make aware that [facility] Care Management following.  Of note, [facility] Care Management services does not replace or interfere with any services that are needed or arranged by inpatient [facility] care management team.   For additional questions or referrals please contact:      Elliot Cousin, RN, BSN [facility] Liaison [place]   [facility]  Population Health Office Hours MTWF 8:00 am to 6 pm off on Thursday [phone] mobile [phone] [Office toll free line][facility] Office Hours are M-F 8:30 - 5 pm 24 hour nurse advise line [phone] Conceirge  [email or web address]

## 2022-09-24 NOTE — Progress Notes (Signed)
  Phone Number: [phone] for Surgical Coordinator Fax Number: [phone]  REQUEST FOR SURGICAL CLEARANCE       Date: Date: 09/24/22  Faxed to: Dr. Beryle Flock, MD  Surgeon: Dr. [name], MD     Date of Surgery: 10/07/2022  Operation: Right Ureteroscopy with Laser Lithotripsy and Stent Exchange   Anesthesia Type: General   Diagnosis: Right Ureteral Stone  Patient Requires:   Medical Clearance : Yes  Reason: We will need for patient to hold Plavix prior to surgery, please advise for how many days.   Risk Assessment:    Low   []       Moderate   []     High   []           This patient is optimized for surgery  YES []       NO   []    I recommend further assessment/workup prior to surgery. YES []      NO  []   Appointment scheduled for: _______________________   Further recommendations: ____________________________________     Physician Signature:__________________________________   Printed Name: ________________________________________   Date: _________________

## 2022-09-24 NOTE — Telephone Encounter (Signed)
I spoke with Misty Page and her daughter. We have discussed possible surgery dates and Thursday May 9th, 2024 was agreed upon by all parties. Patient given information about surgery date, what to expect pre-operatively and post operatively.  We discussed that a Pre-Admission Testing office will be calling to set up the pre-op visit that will take place prior to surgery, and that these appointments are typically done over the phone with a Pre-Admissions RN. Informed patient that our office will communicate any additional care to be provided after surgery. Patients questions or concerns were discussed during our call. Advised to call our office should there be any additional information, questions or concerns that arise. Patient verbalized understanding.

## 2022-09-28 ENCOUNTER — Encounter: Payer: Self-pay | Admitting: Family Medicine

## 2022-09-28 ENCOUNTER — Ambulatory Visit (INDEPENDENT_AMBULATORY_CARE_PROVIDER_SITE_OTHER): Payer: Medicare HMO | Admitting: Family Medicine

## 2022-09-28 VITALS — BP 128/74 | HR 83 | Temp 97.8°F | Resp 12 | Wt 132.0 lb

## 2022-09-28 DIAGNOSIS — N1330 Unspecified hydronephrosis: Secondary | ICD-10-CM

## 2022-09-28 DIAGNOSIS — D509 Iron deficiency anemia, unspecified: Secondary | ICD-10-CM

## 2022-09-28 DIAGNOSIS — R6520 Severe sepsis without septic shock: Secondary | ICD-10-CM

## 2022-09-28 DIAGNOSIS — N201 Calculus of ureter: Secondary | ICD-10-CM | POA: Diagnosis not present

## 2022-09-28 DIAGNOSIS — A419 Sepsis, unspecified organism: Secondary | ICD-10-CM

## 2022-09-28 NOTE — Assessment & Plan Note (Signed)
Improving Patient's symptoms have improved Stent in place Upcoming second procedure as above

## 2022-09-28 NOTE — Assessment & Plan Note (Signed)
Status post stenting Upcoming second phase approach with scope, lithotripsy, stent exchange Okay to hold her Plavix and aspirin for the procedure Discussed with patient and her daughter

## 2022-09-28 NOTE — Progress Notes (Signed)
I,Sulibeya S Dimas,acting as a scribe for Shirlee Latch, MD.,have documented all relevant documentation on the behalf of Shirlee Latch, MD,as directed by  Shirlee Latch, MD while in the presence of Shirlee Latch, MD.     Established patient visit   Patient: Misty Page   DOB: [birth year]   82 y.o. Female  MRN: [id number] Visit Date: 09/28/2022  Today's healthcare provider: Shirlee Latch, MD   Chief Complaint  Patient presents with   Hospitalization Follow-up   Subjective    HPI  Follow up Hospitalization  Patient was admitted to [facility] on 09/19/22 and discharged on 09/23/22. She was treated for right flank pain -> R ureteral stone with infection and hydroureteronephrosis and severe sepsis. Treatment for this included IV fluid, solu-cortef, ciprofloxacin Reviewed labs, scans. Telephone follow up was done on 09/24/22 She reports excellent compliance with treatment. She reports this condition is improved.   Upcoming R uteteroscopy with laser lithotripsy and stent exchange scheduled for 10/07/22 with Dr Apolinar Junes  # Right ureteral stone with infection, and hydroureteronephros and severe sepsis:  BP dropped, patient was given IV fluid bolus, Solu-Cortef. S/p ciprofloxacin 400 IV twice daily Urine consulted s/p Right retrograde pyelogram and Right ureteral stent placement Recommended to follow in 2 weeks for staged ureteroscopic intervention, follow-up will be arranged by urology. Ucx growing staph epi, pansensitive. On 4/24 patient lost IV access, ciprofloxacin changed to p.o. 500 mg p.o. twice daily.  Patient was discharged on ciprofloxacin 500 twice daily for patient all 10 days on discharge.  Started Flomax 0.4 mg p.o. daily and oxybutynin 3 times daily as needed as per urology. Follow with urology in 2 to 3 weeks    ----------------------------------------------------------------------------------------- -   Medications: Outpatient Medications Prior to Visit   Medication Sig   acetaminophen (TYLENOL) 500 MG tablet Take 500-1,000 mg by mouth every 8 (eight) hours as needed.   amLODipine (NORVASC) 5 MG tablet Take 1 tablet (5 mg total) by mouth daily.   Ascorbic Acid (VITAMIN C) 1000 MG tablet TAKE 1 TABLET BY MOUTH EVERY MORNING   Bacillus Coagulans-Inulin (PROBIOTIC-PREBIOTIC) 1-250 BILLION-MG CAPS TAKE 1 CAPSULE BY MOUTH DAILY   BAYER LOW DOSE 81 MG tablet TAKE 1 TABLET BY MOUTH EVERY MORNING   Calcium Carb-Cholecalciferol (CALCIUM+D3) 600-20 MG-MCG TABS Take 1 tablet by mouth daily.   ciprofloxacin (CIPRO) 500 MG tablet Take 1 tablet (500 mg total) by mouth 2 (two) times daily for 10 days.   clopidogrel (PLAVIX) 75 MG tablet TAKE 1 TABLET BY MOUTH DAILY   ezetimibe (ZETIA) 10 MG tablet Take 1 tablet (10 mg total) by mouth daily.   famotidine (PEPCID) 20 MG tablet Take 1 tablet (20 mg total) by mouth 2 (two) times daily.   fluticasone (FLONASE) 50 MCG/ACT nasal spray USE 2 SPRAYS EACH NOSTRIL DAILY (Patient taking differently: Place 1 spray into both nostrils at bedtime as needed for allergies.)   iron polysaccharides (NIFEREX) 150 MG capsule Take 1 capsule (150 mg total) by mouth daily.   losartan (COZAAR) 100 MG tablet TAKE 1 TABLET BY MOUTH DAILY   meclizine (ANTIVERT) 25 MG tablet Take 1 tablet (25 mg total) by mouth 3 (three) times daily as needed for dizziness or nausea.   Mesalamine 800 MG TBEC Take 800 mg by mouth in the morning, at noon, and at bedtime.   methimazole (TAPAZOLE) 5 MG tablet Take 5 mg by mouth. 1 Tablet on Mondays, Wednesdays and Fridays and take 1/2 tablet daily the rest of the week.  oxybutynin (DITROPAN XL) 5 MG 24 hr tablet Take 2 tablets (10 mg total) by mouth 3 (three) times daily as needed (for Pain due to for stent discomfort).   Probiotic Product (PROBIOTIC DAILY PO) Take 1 capsule by mouth daily.   rosuvastatin (CRESTOR) 20 MG tablet TAKE ONE-HALF TABLET BY MOUTH DAILY   tamsulosin (FLOMAX) 0.4 MG CAPS capsule Take  1 capsule (0.4 mg total) by mouth daily.   No facility-administered medications prior to visit.    Review of Systems  Constitutional:  Negative for chills and fever.  Respiratory:  Negative for chest tightness.   Cardiovascular:  Negative for chest pain.  Gastrointestinal:  Negative for abdominal pain, nausea and vomiting.  Genitourinary:  Positive for flank pain. Negative for dysuria.       Objective    BP 128/74 (BP Location: Right Arm, Patient Position: Sitting, Cuff Size: Normal)   Pulse 83   Temp 97.8 F (36.6 C) (Temporal)   Resp 12   Wt 132 lb (59.9 kg)   BMI 24.14 kg/m    Physical Exam Vitals reviewed.  Constitutional:      General: She is not in acute distress.    Appearance: Normal appearance. She is well-developed. She is not diaphoretic.  HENT:     Head: Normocephalic and atraumatic.  Eyes:     General: No scleral icterus.    Conjunctiva/sclera: Conjunctivae normal.  Neck:     Thyroid: No thyromegaly.  Cardiovascular:     Rate and Rhythm: Normal rate and regular rhythm.     Heart sounds: Murmur heard.  Pulmonary:     Effort: Pulmonary effort is normal. No respiratory distress.     Breath sounds: Normal breath sounds. No wheezing, rhonchi or rales.  Abdominal:     General: There is no distension.     Palpations: Abdomen is soft.     Tenderness: There is no abdominal tenderness.  Musculoskeletal:     Cervical back: Neck supple.     Right lower leg: No edema.  Lymphadenopathy:     Cervical: No cervical adenopathy.  Skin:    General: Skin is warm and dry.  Neurological:     Mental Status: She is alert and oriented to person, place, and time. Mental status is at baseline.  Psychiatric:        Mood and Affect: Mood normal.        Behavior: Behavior normal.      No results found for any visits on 09/28/22.  Assessment & Plan     Problem List Items Addressed This Visit       Genitourinary   Right ureteral stone - Primary    Status post  stenting Upcoming second phase approach with scope, lithotripsy, stent exchange Okay to hold her Plavix and aspirin for the procedure Discussed with patient and her daughter      Hydroureteronephrosis    Improving Patient's symptoms have improved Stent in place Upcoming second procedure as above        Other   Iron deficiency anemia    Acute on chronic  Continue PO iron Recheck CBC next week Consider Heme referral for IV iron if not improving      Relevant Orders   CBC   Severe sepsis (HCC)    Resolved with normal vital signs Her UTI symptoms are improving Continue Cipro for the full 10 days as prescribed If her bladder pressure does not continue to improve, she will return to clinic for recheck  Return in about 4 weeks (around 10/26/2022) for anemia follow-up, chronic f/u.      I, Shirlee Latch, MD, have reviewed all documentation for this visit. The documentation on 09/28/22 for the exam, diagnosis, procedures, and orders are all accurate and complete.   Shantoya Geurts, Marzella Schlein, MD, MPH [facility]

## 2022-09-28 NOTE — Assessment & Plan Note (Signed)
Resolved with normal vital signs Her UTI symptoms are improving Continue Cipro for the full 10 days as prescribed If her bladder pressure does not continue to improve, she will return to clinic for recheck

## 2022-09-28 NOTE — Assessment & Plan Note (Signed)
Acute on chronic  Continue PO iron Recheck CBC next week Consider Heme referral for IV iron if not improving

## 2022-09-30 ENCOUNTER — Other Ambulatory Visit: Payer: Self-pay

## 2022-09-30 ENCOUNTER — Encounter
Admission: RE | Admit: 2022-09-30 | Discharge: 2022-09-30 | Disposition: A | Discharge status: Home / Self Care | Payer: Medicare HMO | Source: Ambulatory Visit | Attending: Urology | Admitting: Urology

## 2022-09-30 HISTORY — DX: Personal history of urinary calculi: Z87.442

## 2022-09-30 HISTORY — DX: Other specified postprocedural states: R11.2

## 2022-09-30 HISTORY — DX: Nausea with vomiting, unspecified: Z98.890

## 2022-09-30 HISTORY — DX: Nausea with vomiting, unspecified: R11.2

## 2022-09-30 NOTE — Patient Instructions (Signed)
Your procedure is scheduled on: 10/07/22 - Thursday Report to the Registration Desk on the 1st floor of the Medical Mall. To find out your arrival time, please call [phone] between 1PM - 3PM on: 10/06/22 - Wednesday If your arrival time is 6:00 am, do not arrive before that time as the Medical Mall entrance doors do not open until 6:00 am.  REMEMBER: Instructions that are not followed completely may result in serious medical risk, up to and including death; or upon the discretion of your surgeon and anesthesiologist your surgery may need to be rescheduled.  Do not eat food or drink any liquids after midnight the night before surgery.  No gum chewing or hard candies.   One week prior to surgery: Stop Anti-inflammatories (NSAIDS) such as Advil, Aleve, Ibuprofen, Motrin, Naproxen, Naprosyn and Aspirin based products such as Excedrin, Goody's Powder, BC Powder.  Stop ANY OVER THE COUNTER supplements until after surgery - Vitamin c and calcium + D3.  You may however, continue to take Tylenol if needed for pain up until the day of surgery.  Continue taking all prescribed medications with the exception of the following:  clopidogrel (PLAVIX) hold beginning 10/02/22. BAYER LOW DOSE hold beginning 10/02/22.  TAKE ONLY THESE MEDICATIONS THE MORNING OF SURGERY WITH A SIP OF WATER:  amLODipine (NORVASC)  ezetimibe (ZETIA)  3.   famotidine (PEPCID) - (take one the night before and one on the morning of surgery - helps to prevent        nausea after surgery.) 4.   Mesalamine  5.   methimazole (TAPAZOLE)  6.   rosuvastatin (CRESTOR)  7.   tamsulosin (FLOMAX)   No Alcohol for 24 hours before or after surgery.  No Smoking including e-cigarettes for 24 hours before surgery.  No chewable tobacco products for at least 6 hours before surgery.  No nicotine patches on the day of surgery.  Do not use any "recreational" drugs for at least a week (preferably 2 weeks) before your surgery.   Please be advised that the combination of cocaine and anesthesia may have negative outcomes, up to and including death. If you test positive for cocaine, your surgery will be cancelled.  On the morning of surgery brush your teeth with toothpaste and water, you may rinse your mouth with mouthwash if you wish. Do not swallow any toothpaste or mouthwash.  Do not wear jewelry, make-up, hairpins, clips or nail polish.  Do not wear lotions, powders, or perfumes.   Do not shave body hair from the neck down 48 hours before surgery.  Contact lenses, hearing aids and dentures may not be worn into surgery.  Do not bring valuables to the hospital. [facility] is not responsible for any missing/lost belongings or valuables.   Notify your doctor if there is any change in your medical condition (cold, fever, infection).  Wear comfortable clothing (specific to your surgery type) to the hospital.  After surgery, you can help prevent lung complications by doing breathing exercises.  Take deep breaths and cough every 1-2 hours. Your doctor may order a device called an Incentive Spirometer to help you take deep breaths. When coughing or sneezing, hold a pillow firmly against your incision with both hands. This is called "splinting." Doing this helps protect your incision. It also decreases belly discomfort.  If you are being admitted to the hospital overnight, leave your suitcase in the car. After surgery it may be brought to your room.  In case of increased patient census,  it may be necessary for you, the patient, to continue your postoperative care in the Same Day Surgery department.  If you are being discharged the day of surgery, you will not be allowed to drive home. You will need a responsible individual to drive you home and stay with you for 24 hours after surgery.   If you are taking public transportation, you will need to have a responsible individual with you.  Please call the  Pre-admissions Testing Dept. at [phone] if you have any questions about these instructions.  Surgery Visitation Policy:  Patients having surgery or a procedure may have two visitors.  Children under the age of 80 must have an adult with them who is not the patient.  Inpatient Visitation:    Visiting hours are 7 a.m. to 8 p.m. Up to four visitors are allowed at one time in a patient room. The visitors may rotate out with other people during the day.  One visitor age 2 or older may stay with the patient overnight and must be in the room by 8 p.m.

## 2022-10-01 DIAGNOSIS — M75111 Incomplete rotator cuff tear or rupture of right shoulder, not specified as traumatic: Secondary | ICD-10-CM | POA: Diagnosis not present

## 2022-10-01 DIAGNOSIS — M7581 Other shoulder lesions, right shoulder: Secondary | ICD-10-CM | POA: Diagnosis not present

## 2022-10-04 DIAGNOSIS — D509 Iron deficiency anemia, unspecified: Secondary | ICD-10-CM | POA: Diagnosis not present

## 2022-10-05 LAB — CBC
Hematocrit: 32.2 % — ABNORMAL LOW (ref 34.0–46.6)
Hemoglobin: 9.2 g/dL — ABNORMAL LOW (ref 11.1–15.9)
MCH: 21.8 pg — ABNORMAL LOW (ref 26.6–33.0)
MCHC: 28.6 g/dL — ABNORMAL LOW (ref 31.5–35.7)
MCV: 76 fL — ABNORMAL LOW (ref 79–97)
Platelets: 545 x10E3/uL — ABNORMAL HIGH (ref 150–450)
RBC: 4.22 x10E6/uL (ref 3.77–5.28)
RDW: 17.4 % — ABNORMAL HIGH (ref 11.7–15.4)
WBC: 7.1 x10E3/uL (ref 3.4–10.8)

## 2022-10-05 MED ORDER — METOCLOPRAMIDE HCL 5 MG/ML IJ SOLN
INTRAMUSCULAR | Status: AC
  Filled 2022-10-05: qty 2

## 2022-10-06 NOTE — Progress Notes (Signed)
Improving anemia in setting of ongoing iron deficiency; reassuring.

## 2022-10-07 ENCOUNTER — Ambulatory Visit: Payer: Medicare HMO | Admitting: Anesthesiology

## 2022-10-07 ENCOUNTER — Ambulatory Visit: Payer: Medicare HMO

## 2022-10-07 ENCOUNTER — Encounter: Payer: Self-pay | Admitting: Urology

## 2022-10-07 ENCOUNTER — Other Ambulatory Visit: Payer: Self-pay

## 2022-10-07 ENCOUNTER — Ambulatory Visit
Admission: RE | Admit: 2022-10-07 | Discharge: 2022-10-07 | Disposition: A | Discharge status: Home / Self Care | Payer: Medicare HMO | Attending: Urology | Admitting: Urology

## 2022-10-07 ENCOUNTER — Encounter
Admission: RE | Disposition: A | Discharge status: Home / Self Care | Payer: Self-pay | Source: Home / Self Care | Attending: Urology

## 2022-10-07 DIAGNOSIS — I129 Hypertensive chronic kidney disease with stage 1 through stage 4 chronic kidney disease, or unspecified chronic kidney disease: Secondary | ICD-10-CM | POA: Insufficient documentation

## 2022-10-07 DIAGNOSIS — Z89612 Acquired absence of left leg above knee: Secondary | ICD-10-CM | POA: Diagnosis not present

## 2022-10-07 DIAGNOSIS — N136 Pyonephrosis: Secondary | ICD-10-CM | POA: Insufficient documentation

## 2022-10-07 DIAGNOSIS — Z87891 Personal history of nicotine dependence: Secondary | ICD-10-CM | POA: Diagnosis not present

## 2022-10-07 DIAGNOSIS — Z09 Encounter for follow-up examination after completed treatment for conditions other than malignant neoplasm: Secondary | ICD-10-CM | POA: Insufficient documentation

## 2022-10-07 DIAGNOSIS — Z8673 Personal history of transient ischemic attack (TIA), and cerebral infarction without residual deficits: Secondary | ICD-10-CM | POA: Diagnosis not present

## 2022-10-07 DIAGNOSIS — I252 Old myocardial infarction: Secondary | ICD-10-CM | POA: Diagnosis not present

## 2022-10-07 DIAGNOSIS — N189 Chronic kidney disease, unspecified: Secondary | ICD-10-CM | POA: Diagnosis not present

## 2022-10-07 DIAGNOSIS — I10 Essential (primary) hypertension: Secondary | ICD-10-CM | POA: Diagnosis not present

## 2022-10-07 DIAGNOSIS — N201 Calculus of ureter: Secondary | ICD-10-CM

## 2022-10-07 DIAGNOSIS — N132 Hydronephrosis with renal and ureteral calculous obstruction: Secondary | ICD-10-CM

## 2022-10-07 DIAGNOSIS — Z955 Presence of coronary angioplasty implant and graft: Secondary | ICD-10-CM | POA: Insufficient documentation

## 2022-10-07 DIAGNOSIS — Z8619 Personal history of other infectious and parasitic diseases: Secondary | ICD-10-CM | POA: Diagnosis not present

## 2022-10-07 DIAGNOSIS — Z7902 Long term (current) use of antithrombotics/antiplatelets: Secondary | ICD-10-CM | POA: Diagnosis not present

## 2022-10-07 DIAGNOSIS — I2510 Atherosclerotic heart disease of native coronary artery without angina pectoris: Secondary | ICD-10-CM | POA: Diagnosis not present

## 2022-10-07 DIAGNOSIS — N1330 Unspecified hydronephrosis: Secondary | ICD-10-CM

## 2022-10-07 HISTORY — PX: CYSTOSCOPY/URETEROSCOPY/HOLMIUM LASER/STENT PLACEMENT: SHX6546

## 2022-10-07 SURGERY — CYSTOSCOPY/URETEROSCOPY/HOLMIUM LASER/STENT PLACEMENT
Anesthesia: General | Laterality: Right

## 2022-10-07 MED ORDER — DEXAMETHASONE SODIUM PHOSPHATE 10 MG/ML IJ SOLN
INTRAMUSCULAR | Status: AC
  Filled 2022-10-07: qty 1

## 2022-10-07 MED ORDER — LACTATED RINGERS IV SOLN: INTRAVENOUS | Status: DC

## 2022-10-07 MED ORDER — PROPOFOL 10 MG/ML IV BOLUS
INTRAVENOUS | Status: AC
  Filled 2022-10-07: qty 20

## 2022-10-07 MED ORDER — DEXAMETHASONE SODIUM PHOSPHATE 10 MG/ML IJ SOLN
INTRAMUSCULAR | Status: DC | PRN
  Administered 2022-10-07: 5 mg via INTRAVENOUS

## 2022-10-07 MED ORDER — FENTANYL CITRATE (PF) 100 MCG/2ML IJ SOLN
INTRAMUSCULAR | Status: AC
  Filled 2022-10-07: qty 2

## 2022-10-07 MED ORDER — PROPOFOL 10 MG/ML IV BOLUS
INTRAVENOUS | Status: DC | PRN
  Administered 2022-10-07: 80 mg via INTRAVENOUS

## 2022-10-07 MED ORDER — CIPROFLOXACIN IN D5W 400 MG/200ML IV SOLN
400.0000 mg | INTRAVENOUS | Status: AC
  Administered 2022-10-07: 200 mg via INTRAVENOUS

## 2022-10-07 MED ORDER — LIDOCAINE HCL (CARDIAC) PF 100 MG/5ML IV SOSY
PREFILLED_SYRINGE | INTRAVENOUS | Status: DC | PRN
  Administered 2022-10-07: 100 mg via INTRAVENOUS

## 2022-10-07 MED ORDER — IOHEXOL 180 MG/ML  SOLN
INTRAMUSCULAR | Status: DC | PRN
  Administered 2022-10-07: 20 mL

## 2022-10-07 MED ORDER — ACETAMINOPHEN 500 MG PO TABS
ORAL_TABLET | ORAL | Status: AC
  Filled 2022-10-07: qty 2

## 2022-10-07 MED ORDER — ROCURONIUM BROMIDE 10 MG/ML (PF) SYRINGE
PREFILLED_SYRINGE | INTRAVENOUS | Status: AC
  Filled 2022-10-07: qty 10

## 2022-10-07 MED ORDER — ACETAMINOPHEN 500 MG PO TABS
1000.0000 mg | ORAL_TABLET | Freq: Once | ORAL | Status: AC
  Administered 2022-10-07: 1000 mg via ORAL

## 2022-10-07 MED ORDER — SUGAMMADEX SODIUM 200 MG/2ML IV SOLN
INTRAVENOUS | Status: DC | PRN
  Administered 2022-10-07: 120 mg via INTRAVENOUS

## 2022-10-07 MED ORDER — PHENYLEPHRINE 80 MCG/ML (10ML) SYRINGE FOR IV PUSH (FOR BLOOD PRESSURE SUPPORT)
PREFILLED_SYRINGE | INTRAVENOUS | Status: DC | PRN
  Administered 2022-10-07 (×2): 80 ug via INTRAVENOUS

## 2022-10-07 MED ORDER — PROMETHAZINE HCL 25 MG/ML IJ SOLN: 6.2500 mg | INTRAMUSCULAR | Status: DC | PRN

## 2022-10-07 MED ORDER — OXYCODONE HCL 5 MG/5ML PO SOLN: 5.0000 mg | Freq: Once | ORAL | Status: DC | PRN

## 2022-10-07 MED ORDER — DROPERIDOL 2.5 MG/ML IJ SOLN
INTRAMUSCULAR | Status: AC
  Filled 2022-10-07: qty 2

## 2022-10-07 MED ORDER — CIPROFLOXACIN IN D5W 400 MG/200ML IV SOLN
INTRAVENOUS | Status: AC
  Filled 2022-10-07: qty 200

## 2022-10-07 MED ORDER — ONDANSETRON HCL 4 MG/2ML IJ SOLN
INTRAMUSCULAR | Status: DC | PRN
  Administered 2022-10-07: 4 mg via INTRAVENOUS

## 2022-10-07 MED ORDER — OXYBUTYNIN CHLORIDE ER 5 MG PO TB24: 10.0000 mg | ORAL_TABLET | Freq: Three times a day (TID) | ORAL | 0 refills | Status: AC | PRN

## 2022-10-07 MED ORDER — ACETAMINOPHEN 10 MG/ML IV SOLN: 1000.0000 mg | Freq: Once | INTRAVENOUS | Status: DC | PRN

## 2022-10-07 MED ORDER — ONDANSETRON HCL 4 MG/2ML IJ SOLN
INTRAMUSCULAR | Status: AC
  Filled 2022-10-07: qty 2

## 2022-10-07 MED ORDER — FENTANYL CITRATE (PF) 100 MCG/2ML IJ SOLN: 25.0000 ug | INTRAMUSCULAR | Status: DC | PRN

## 2022-10-07 MED ORDER — SODIUM CHLORIDE 0.9 % IR SOLN
Status: DC | PRN
  Administered 2022-10-07: 3000 mL via INTRAVESICAL

## 2022-10-07 MED ORDER — ROCURONIUM BROMIDE 100 MG/10ML IV SOLN
INTRAVENOUS | Status: DC | PRN
  Administered 2022-10-07: 40 mg via INTRAVENOUS

## 2022-10-07 MED ORDER — HYDROCODONE-ACETAMINOPHEN 5-325 MG PO TABS: 1.0000 | ORAL_TABLET | Freq: Four times a day (QID) | ORAL | 0 refills | Status: DC | PRN

## 2022-10-07 MED ORDER — ESMOLOL HCL 100 MG/10ML IV SOLN
INTRAVENOUS | Status: DC | PRN
  Administered 2022-10-07 (×2): 20 mg via INTRAVENOUS

## 2022-10-07 MED ORDER — CHLORHEXIDINE GLUCONATE 0.12 % MT SOLN
15.0000 mL | Freq: Once | OROMUCOSAL | Status: AC
  Administered 2022-10-07: 15 mL via OROMUCOSAL

## 2022-10-07 MED ORDER — CHLORHEXIDINE GLUCONATE 0.12 % MT SOLN
OROMUCOSAL | Status: AC
  Filled 2022-10-07: qty 15

## 2022-10-07 MED ORDER — ORAL CARE MOUTH RINSE: 15.0000 mL | Freq: Once | OROMUCOSAL | Status: AC

## 2022-10-07 MED ORDER — DROPERIDOL 2.5 MG/ML IJ SOLN
0.6250 mg | Freq: Once | INTRAMUSCULAR | Status: AC | PRN
  Administered 2022-10-07: 0.625 mg via INTRAVENOUS

## 2022-10-07 MED ORDER — LIDOCAINE HCL (PF) 2 % IJ SOLN
INTRAMUSCULAR | Status: AC
  Filled 2022-10-07: qty 5

## 2022-10-07 MED ORDER — OXYCODONE HCL 5 MG PO TABS: 5.0000 mg | ORAL_TABLET | Freq: Once | ORAL | Status: DC | PRN

## 2022-10-07 MED ORDER — FENTANYL CITRATE (PF) 100 MCG/2ML IJ SOLN
INTRAMUSCULAR | Status: DC | PRN
  Administered 2022-10-07 (×2): 50 ug via INTRAVENOUS

## 2022-10-07 SURGICAL SUPPLY — 24 items
BAG DRAIN SIEMENS DORNER NS (MISCELLANEOUS) ×2 IMPLANT
BAG DRN NS LF (MISCELLANEOUS) ×1
BRUSH SCRUB EZ  4% CHG (MISCELLANEOUS) ×1
BRUSH SCRUB EZ 4% CHG (MISCELLANEOUS) IMPLANT
CATH URET FLEX-TIP 2 LUMEN 10F (CATHETERS) IMPLANT
CATH URETL OPEN 5X70 (CATHETERS) ×2 IMPLANT
CNTNR URN SCR LID CUP LEK RST (MISCELLANEOUS) IMPLANT
CONT SPEC 4OZ STRL OR WHT (MISCELLANEOUS) ×1
DRAPE UTILITY 15X26 TOWEL STRL (DRAPES) ×2 IMPLANT
FIBER LASER MOSES 200 DFL (Laser) IMPLANT
GLOVE BIO SURGEON STRL SZ 6.5 (GLOVE) ×2 IMPLANT
GOWN STRL REUS W/ TWL LRG LVL3 (GOWN DISPOSABLE) ×4 IMPLANT
GOWN STRL REUS W/TWL LRG LVL3 (GOWN DISPOSABLE) ×2
GUIDEWIRE GREEN .038 145CM (MISCELLANEOUS) IMPLANT
GUIDEWIRE STR DUAL SENSOR (WIRE) ×2 IMPLANT
IV NS IRRIG 3000ML ARTHROMATIC (IV SOLUTION) ×2 IMPLANT
KIT TURNOVER CYSTO (KITS) ×2 IMPLANT
PACK CYSTO AR (MISCELLANEOUS) ×2 IMPLANT
SET CYSTO W/LG BORE CLAMP LF (SET/KITS/TRAYS/PACK) ×2 IMPLANT
SHEATH NAVIGATOR HD 12/14X36 (SHEATH) IMPLANT
STENT URET 6FRX24 CONTOUR (STENTS) IMPLANT
SURGILUBE 2OZ TUBE FLIPTOP (MISCELLANEOUS) ×2 IMPLANT
TRAP FLUID SMOKE EVACUATOR (MISCELLANEOUS) ×2 IMPLANT
WATER STERILE IRR 500ML POUR (IV SOLUTION) ×2 IMPLANT

## 2022-10-07 NOTE — Anesthesia Procedure Notes (Signed)
Procedure Name: Intubation Date/Time: 10/07/2022 10:21 AM  Performed by: Morene Crocker, CRNAPre-anesthesia Checklist: Patient identified, Patient being monitored, Timeout performed, Emergency Drugs available and Suction available Patient Re-evaluated:Patient Re-evaluated prior to induction Oxygen Delivery Method: Circle system utilized Preoxygenation: Pre-oxygenation with 100% oxygen Induction Type: IV induction Ventilation: Mask ventilation without difficulty Laryngoscope Size: 3 and McGraph Grade View: Grade I Tube type: Oral Tube size: 6.5 mm Number of attempts: 1 Airway Equipment and Method: Stylet Placement Confirmation: ETT inserted through vocal cords under direct vision, positive ETCO2 and breath sounds checked- equal and bilateral Secured at: 21 cm Tube secured with: Tape Dental Injury: Teeth and Oropharynx as per pre-operative assessment  Comments: Smooth atraumatic intubation, no complications noted .

## 2022-10-07 NOTE — Interval H&P Note (Signed)
History and Physical Interval Note:  10/07/2022 9:49 AM  Misty Page  has presented today for surgery, with the diagnosis of Right Ureteral Stone.  The various methods of treatment have been discussed with the patient and family. After consideration of risks, benefits and other options for treatment, the patient has consented to  Procedure(s): CYSTOSCOPY/URETEROSCOPY/HOLMIUM LASER/STENT PLACEMENT (Right) as a surgical intervention.  The patient's history has been reviewed, patient examined, no change in status, stable for surgery.  I have reviewed the patient's chart and labs.  Questions were answered to the patient's satisfaction.    RRR CTAB  Preprocedure abx complete  Risks and benefits of ureteroscopy were reviewed including but not limited to infection, bleeding, pain, ureteral injury which could require open surgery versus prolonged indwelling if ureteral perforation occurs, persistent stone disease, requirement for staged procedure, possible stent, and global anesthesia risks. Patient expressed understanding and desires to proceed with ureteroscopy.    [name]

## 2022-10-07 NOTE — Op Note (Signed)
Date of procedure: 10/07/22  Preoperative diagnosis:  Right ureteral calculus Right hydronephrosis Right nonobstructing kidney stones History of sepsis of urinary source  Postoperative diagnosis:  Same as above   Procedure: Right ureteroscopy Laser lithotripsy Right ureteral stent exchange Retrograde pyelogram Interpretation of fluoroscopy less than 30 minutes  Surgeon: [name], MD  Anesthesia: General  Complications: None  Intraoperative findings: Distal migration of 7 mm right ureteral calculus.  Larger nonobstructing stone in the mid upper pole treated, all additional stones consistent with Randall's plaques.  Stent replaced without tether.  Uncomplicated procedure.  EBL: Minimal  Specimens: Stone fragment  Drains: 6 x 24 French double-J ureteral stent on right without tether  Indication: Misty Page is a 82 y.o. patient with obstructing right ureteral calculus who initially presented with sepsis of urinary source.  She underwent emergent ureteral stent placement returns today for staged procedure after her infection has been completely treated.  After reviewing the management options for treatment,   elected to proceed with the above surgical procedure(s). We have discussed the potential benefits and risks of the procedure, side effects of the proposed treatment, the likelihood of the patient achieving the goals of the procedure, and any potential problems that might occur during the procedure or recuperation. Informed consent has been obtained.  Description of procedure:  The patient was taken to the operating room and general anesthesia was induced.  The patient was placed in the dorsal lithotomy position, prepped and draped in the usual sterile fashion, and preoperative antibiotics were administered. A preoperative time-out was performed.    A 21 French scope was advanced per urethra into the bladder.  Attention was turned to the right ureteral orifice from  which a ureteral stent was seen emanating.  The distal coil of the stent was grasped and brought to level urethral meatus.  This was then cannulated using a sensor wire up to the level of the kidney.  A dual-lumen ureteral access sheath was used just within the distal ureter and additional contrast was used to create a retrograde pyelogram on the side.  I did not see any filling defect along the length of the ureter nor any calcification along the ureter, presumably the stone had either passed or moved up into the kidney as was not appreciated today.  A Super Stiff wire was then introduced all the way up to the level of the kidney through the second lumen of the catheter.  The sensor wire was snapped in place as a safety wire.  A ureteral access sheath, 10/12 [place] was advanced to the proximal ureter which went very easily.  A dual-lumen digital ureteroscope was then advanced up to the level of the stone identified in the upper pole calyx.  A 200 m laser fiber was then brought in using dusting settings of 0.3 J and 60 Hz, the stone was dusted.  The remainder of the calyces were directly visualized and there was no significant residual stone burden other than a few small Randall's plaques.  At the end of the procedure, no significant residual stone fragment remained greater than the size of the tip of the laser fiber.  A final retrograde pyelogram created roadmap to ensure that each every calyx was visualized and all significant stone burden was addressed.  There was no contrast extravasation.  The scope was then backed down the length of the ureter inspecting the ureteral integrity along the way.  Just within the distal ureter, the 7 mm x 3 mm stone  was identified.  Based the laser to fragment this into 2 or 3 pieces and these were able to be evacuated through the access sheath.  No residual stone remained.  There was some mild edema at this level but otherwise no abrasions ulcerations or injuries to the ureter 2.   No residual stone fragment remaining..  Finally, a 6 by 81 French ureteral access sheath was advanced over the safety wire up to the level of the kidney.  Upon wire removal, there was a full coil noted both within the renal pelvis as well as within the bladder.    The patient was then cleaned and dried, repositioned in supine position, reversed of anesthesia, taken to the PACU in stable condition.  Plan: Plan for cystoscopy, stent removal in the office next week  [name], M.D.

## 2022-10-07 NOTE — Transfer of Care (Signed)
Immediate Anesthesia Transfer of Care Note  Patient: KACY PROVO  Procedure(s) Performed: CYSTOSCOPY/URETEROSCOPY/HOLMIUM LASER/STENT PLACEMENT (Right)  Patient Location: PACU  Anesthesia Type:General  Level of Consciousness: awake, alert , and oriented  Airway & Oxygen Therapy: Patient connected to nasal cannula oxygen  Post-op Assessment: Report given to RN and Post -op Vital signs reviewed and stable  Post vital signs: Reviewed and stable  Last Vitals:  Vitals Value Taken Time  BP 143/61 10/07/22 1055  Temp 35.9 1056  Pulse 94 10/07/22 1059  Resp 14 10/07/22 1059  SpO2 96 % 10/07/22 1059  Vitals shown include unvalidated device data.  Last Pain:  Vitals:   10/07/22 0935  TempSrc: Temporal  PainSc: 0-No pain         Complications: No notable events documented.

## 2022-10-07 NOTE — Anesthesia Preprocedure Evaluation (Addendum)
Anesthesia Evaluation  Patient identified by MRN, date of birth, ID band Patient awake    Reviewed: Allergy & Precautions, NPO status , Patient's Chart, lab work & pertinent test results  History of Anesthesia Complications (+) PONV and history of anesthetic complications  Airway Mallampati: IV   Neck ROM: Full    Dental  (+) Implants Bridges :   Pulmonary former smoker   Pulmonary exam normal breath sounds clear to auscultation       Cardiovascular hypertension, + CAD (s/p MI and stents on Plavix) and + Peripheral Vascular Disease (s/p CEA)  Normal cardiovascular exam+ Valvular Problems/Murmurs AS  Rhythm:Regular Rate:Normal  ECG 09/19/22:  Sinus tachycardia Nonspecific ST abnormality Abnormal ECG When compared with ECG of 12-Apr-2021 14:44, No significant change was found   Neuro/Psych TIA   GI/Hepatic PUD,GERD  ,,  Endo/Other    Renal/GU Renal disease (CKD)     Musculoskeletal  (+) Arthritis ,    Abdominal  (+) + obese  Peds  Hematology  (+) Blood dyscrasia, anemia   Anesthesia Other Findings S/p left upper leg amputation for carcinoma  Reproductive/Obstetrics                             Anesthesia Physical Anesthesia Plan  ASA: 3  Anesthesia Plan: General   Post-op Pain Management: Tylenol PO (pre-op)* and Toradol IV (intra-op)*   Induction: Intravenous  PONV Risk Score and Plan: 4 or greater and Ondansetron, Dexamethasone and Treatment may vary due to age or medical condition  Airway Management Planned: LMA  Additional Equipment:   Intra-op Plan:   Post-operative Plan: Extubation in OR  Informed Consent: I have reviewed the patients History and Physical, chart, labs and discussed the procedure including the risks, benefits and alternatives for the proposed anesthesia with the patient or authorized representative who has indicated his/her understanding and acceptance.    Patient has DNR.  Discussed DNR with patient and Continue DNR.   Dental advisory given  Plan Discussed with: CRNA  Anesthesia Plan Comments: (Patient consented for risks of anesthesia including but not limited to:  - adverse reactions to medications - damage to eyes, teeth, lips or other oral mucosa - nerve damage due to positioning  - sore throat or hoarseness - damage to heart, brain, nerves, lungs, other parts of body or loss of life  Informed patient about role of CRNA in peri- and intra-operative care.  Patient voiced understanding.)        Anesthesia Quick Evaluation

## 2022-10-07 NOTE — Anesthesia Postprocedure Evaluation (Signed)
Anesthesia Post Note  Patient: Misty Page  Procedure(s) Performed: CYSTOSCOPY/URETEROSCOPY/HOLMIUM LASER/STENT PLACEMENT (Right)  Patient location during evaluation: PACU Anesthesia Type: General Level of consciousness: awake and alert Pain management: pain level controlled Vital Signs Assessment: post-procedure vital signs reviewed and stable Respiratory status: spontaneous breathing, nonlabored ventilation and respiratory function stable Cardiovascular status: blood pressure returned to baseline and stable Postop Assessment: no apparent nausea or vomiting Anesthetic complications: no   No notable events documented.   Last Vitals:  Vitals:   10/07/22 1130 10/07/22 1145  BP: (!) 101/51 (!) 99/56  Pulse: 85 74  Resp: 19 12  Temp:  36.6 C  SpO2: 94% 95%    Last Pain:  Vitals:   10/07/22 1145  TempSrc:   PainSc: 0-No pain                 Foye Deer

## 2022-10-07 NOTE — Discharge Instructions (Addendum)
You have a ureteral stent in place.  This is a tube that extends from your kidney to your bladder.  This may cause urinary bleeding, burning with urination, and urinary frequency.  Please call our office or present to the ED if you develop fevers >101 or pain which is not able to be controlled with oral pain medications.  You may be given either Flomax and/ or ditropan to help with bladder spasms and stent pain in addition to pain medications.    [facility] [address], [address] [place], [place] [address] [phone] AMBULATORY SURGERY  DISCHARGE INSTRUCTIONS   The drugs that you were given will stay in your system until tomorrow so for the next 24 hours you should not:  Drive an automobile Make any legal decisions Drink any alcoholic beverage   You may resume regular meals tomorrow.  Today it is better to start with liquids and gradually work up to solid foods.  You may eat anything you prefer, but it is better to start with liquids, then soup and crackers, and gradually work up to solid foods.   Please notify your doctor immediately if you have any unusual bleeding, trouble breathing, redness and pain at the surgery site, drainage, fever, or pain not relieved by medication.    Additional Instructions:        Please contact your physician with any problems or Same Day Surgery at [phone], Monday through Friday 6 am to 4 pm, or [place] [facility] [place] Main number at [phone].  

## 2022-10-08 ENCOUNTER — Encounter: Payer: Self-pay | Admitting: Urology

## 2022-10-11 ENCOUNTER — Other Ambulatory Visit: Payer: Medicare HMO

## 2022-10-14 ENCOUNTER — Ambulatory Visit (INDEPENDENT_AMBULATORY_CARE_PROVIDER_SITE_OTHER): Payer: Medicare HMO | Admitting: Urology

## 2022-10-14 VITALS — BP 105/65 | HR 91

## 2022-10-14 DIAGNOSIS — Z87442 Personal history of urinary calculi: Secondary | ICD-10-CM

## 2022-10-14 DIAGNOSIS — N201 Calculus of ureter: Secondary | ICD-10-CM

## 2022-10-14 DIAGNOSIS — R8290 Unspecified abnormal findings in urine: Secondary | ICD-10-CM

## 2022-10-14 DIAGNOSIS — Z466 Encounter for fitting and adjustment of urinary device: Secondary | ICD-10-CM

## 2022-10-14 MED ORDER — SULFAMETHOXAZOLE-TRIMETHOPRIM 800-160 MG PO TABS: 1.0000 | ORAL_TABLET | Freq: Two times a day (BID) | ORAL | 0 refills | Status: DC

## 2022-10-14 MED ORDER — SULFAMETHOXAZOLE-TRIMETHOPRIM 800-160 MG PO TABS
1.0000 | ORAL_TABLET | Freq: Two times a day (BID) | ORAL | Status: DC
Start: 2022-10-14 — End: 2024-02-14

## 2022-10-14 NOTE — Progress Notes (Signed)
   10/14/22  CC:  Chief Complaint  Patient presents with   Cysto Stent Removal    HPI: 82 year old female who presents today for cystoscopy, stent removal.  She underwent definitive management of her obstructing stone on 10/07/2022.  She reports that she is having a lot of bladder pressure and irritation from her stent.  Unfortunately she is unable to provide a urine sample today.  She denies any dysuria fevers or chills.  Blood pressure 105/65, pulse 91. NED. A&Ox3.   No respiratory distress   Abd soft, NT, ND Normal external genitalia with patent urethral meatus  Cystoscopy/ Stent removal procedure  Patient identification was confirmed, informed consent was obtained, and patient was prepped using Betadine solution.  Lidocaine jelly was administered per urethral meatus.    Preoperative abx where received prior to procedure.    Procedure: - Flexible cystoscope introduced, without any difficulty.   - Thorough search of the bladder revealed:    normal urethral meatus  Stent seen emanating from right ureteral orifice, grasped with stent graspers, and removed in entirety.     Moderate debris in bladder limiting visualization.  Post-Procedure: - Patient tolerated the procedure well   Assessment/ Plan:  1. Right ureteral stone Status post ureteroscopy - sulfamethoxazole-trimethoprim (BACTRIM DS) 800-160 MG per tablet 1 tablet - Ultrasound renal complete; Future  2. Encounter for removal of ureteral stent Stent removed today without difficulty  Precautions reviewed - sulfamethoxazole-trimethoprim (BACTRIM DS) 800-160 MG per tablet 1 tablet - Ultrasound renal complete; Future  3. Cloudy urine Urine was relatively cloudy today with poor visualization.  Given her overall frailty we will go ahead and treat her for empirically with Bactrim for the next 5 days out of the upmost precaution.  She is agreeable this plan.  Follow-up in 4 weeks with renal ultrasound  prior.   [name], MD

## 2022-10-15 ENCOUNTER — Ambulatory Visit: Payer: Medicare HMO

## 2022-10-15 DIAGNOSIS — M810 Age-related osteoporosis without current pathological fracture: Secondary | ICD-10-CM

## 2022-10-15 DIAGNOSIS — D509 Iron deficiency anemia, unspecified: Secondary | ICD-10-CM

## 2022-10-15 DIAGNOSIS — E7800 Pure hypercholesterolemia, unspecified: Secondary | ICD-10-CM

## 2022-10-15 NOTE — Patient Instructions (Signed)
Visit Information It was great speaking with you today!  Please let me know if you have any questions about our visit.  Plan:  Continue your current medications  Print copy of patient instructions, educational materials, and care plan provided in person.  Face to Face appointment with pharmacist scheduled for:  01/10/2023 at 10:00 AM  Angelena Sole, PharmD, Patsy Baltimore, CPP  Clinical Pharmacist Practitioner  [facility] [phone]

## 2022-10-15 NOTE — Progress Notes (Addendum)
f2 Care Management & Coordination Services Pharmacy Note  10/15/2022 Name:  Misty Page MRN:  [id number] DOB:  [birth year]  Summary: Patient presents for follow-up consult.   Patient declines to schedule DEXA or start Repatha.   -Patient refuses to restart alendronate due to challenges with when she was taking the medication. She doesn't see the need to recheck her DEXA at this point. We discussed alternative agents such as Prolia that could help reduce fracture risk but patient was not amenable at this time.   -Patient declines Repatha at this time. She states that she doesn't want to add any more medications with everything else going on with her health. We discussed the importance of reducing cardiovascular risk and the potential of Repatha allowing her to discontinue her other cholesterol medications, but patient wanted to defer today.   -Patient reports she is unsure if she getting an iron supplement at this time. Her pharmacy has been filling Ferrex 150 mg daily, but has not been including it in her compliance packaging.    Recommendations/Changes made from today's visit: Continue current medications  Recommend FOBT at PCP follow-up given relatively new development of anemia.   Follow up plan: CPP follow-up 3 months  Subjective: Misty Page is an 82 y.o. 82 y.o. year old female who is a primary patient of [name], Marzella Schlein, MD.  The care coordination team was consulted for assistance with disease management and care coordination needs.    Engaged with patient by telephone for follow up visit.  Recent office visits: 09/28/22: Patient presented to Dr. Beryle Page for hospital follow-up.  07/23/22: Patient presented to Dr. Beryle Page for physical.   Recent consult visits:  10/01/22: Patient presented to Dr. Joice Page (Ortho) for tendinitis.  07/12/22: Patient presented to Dr. Lorretta Page (Vascular) for follow-up.   06/02/2022 Misty Quan, MD (Endocrinology) I am unable to view this  note 04/15/2022 Misty Reas, MD (Gastroenterology) for Bloating- No medication changes noted, No orders placed, BP: 173/86, Patient instructed to follow-up in 1 year  04/02/2022 Misty Rhein, MD (Ortho Surgery) - I am unable to view this note  02/17/2022 Misty Plumber, NP (Vascular Surgery) for Follow-up- No medication changes noted, VAS [place] ABI W/WO TBI order placed, BP: 157/76, No follow-up noted  01/01/2022 Misty Puffer, MD (Ortho Surgery) for Follow-up-No medication changes noted, No orders placed, Patient to follow-up in 3 months   12/31/2021 Misty Dredge, MD (Vascular Surgery) for Bilateral Carotid Artery Stenosis- No medication changes noted, No orders placed, Patient instructed to follow-up in 6 months  Hospital visits: 10/07/22: Patient hospitalized for ureteroscopy  4/21-4/25: Patient hospitalized for ureteral stone + sepsis.  Objective:  Lab Results  Component Value Date   CREATININE 0.41 (L) 09/23/2022   BUN 10 09/23/2022   EGFR 90 07/23/2022   GFRNONAA >60 09/23/2022   GFRAA 107 04/28/2020   NA 138 09/23/2022   K 4.3 09/23/2022   CALCIUM 8.0 (L) 09/23/2022   CO2 23 09/23/2022   GLUCOSE 108 (H) 09/23/2022    No results found for: "HGBA1C", "FRUCTOSAMINE", "GFR", "MICROALBUR"  Last diabetic Eye exam: No results found for: "HMDIABEYEEXA"  Last diabetic Foot exam: No results found for: "HMDIABFOOTEX"   Lab Results  Component Value Date   CHOL 184 07/23/2022   HDL 70 07/23/2022   LDLCALC 101 (H) 07/23/2022   TRIG 71 07/23/2022   CHOLHDL 3.0 05/08/2021       Latest Ref Rng & Units 09/19/2022    2:00 AM 07/23/2022  10:26 AM 04/12/2021    3:17 PM  Hepatic Function  Total Protein 6.5 - 8.1 g/dL 7.2  6.2  7.3   Albumin 3.5 - 5.0 g/dL 4.1  4.5  4.2   AST 15 - 41 U/L 26  12  22    ALT 0 - 44 U/L 15  9  18    Alk Phosphatase 38 - 126 U/L 62  76  64   Total Bilirubin 0.3 - 1.2 mg/dL 0.3  0.2  0.8   Bilirubin, Direct 0.0 - 0.2 mg/dL <4.0        Lab Results  Component Value Date/Time   TSH 0.22 (A) 08/06/2020 12:00 AM   TSH 0.269 (L) 10/22/2019 11:11 AM   TSH <0.010 (L) 05/17/2019 12:27 PM   TSH 1.17 02/25/2019 12:00 AM   TSH 0.022 (L) 12/22/2016 09:35 AM   FREET4 1.32 (H) 05/17/2019 12:27 PM       Latest Ref Rng & Units 10/04/2022   11:08 AM 09/23/2022    5:50 AM 09/22/2022    6:13 AM  CBC  WBC 3.4 - 10.8 x10E3/uL 7.1  6.0  9.2   Hemoglobin 11.1 - 15.9 g/dL 9.2  8.2  8.0   Hematocrit 34.0 - 46.6 % 32.2  28.8  27.0   Platelets 150 - 450 x10E3/uL 545  345  367     Lab Results  Component Value Date/Time   VD25OH 30.70 09/20/2022 09:12 AM   VITAMINB12 465 09/20/2022 09:12 AM   VITAMINB12 487 11/21/2019 10:43 AM    Clinical ASCVD: Yes  The ASCVD Risk score (Arnett DK, et al., 2019) failed to calculate for the following reasons:   The 2019 ASCVD risk score is only valid for ages 1 to 34   The patient has a prior MI or stroke diagnosis       12/22/2021    9:08 AM 08/21/2021   10:02 AM 05/04/2021   10:57 AM  Depression screen PHQ 2/9  Decreased Interest 1 1 1   Down, Depressed, Hopeless 1 1 0  PHQ - 2 Score 2 2 1   Altered sleeping 1 1 1   Tired, decreased energy 1 1 1   Change in appetite 0 0 0  Feeling bad or failure about yourself  0 0 0  Trouble concentrating 0 0 0  Moving slowly or fidgety/restless 0 0 0  Suicidal thoughts 0 0 0  PHQ-9 Score 4 4 3   Difficult doing work/chores Not difficult at all Not difficult at all Not difficult at all     Social History   Tobacco Use  Smoking Status Former   Types: Cigarettes   Quit date: 05/30/1994   Years since quitting: 28.3  Smokeless Tobacco Never   BP Readings from Last 3 Encounters:  10/14/22 105/65  10/07/22 (!) 138/58  09/28/22 128/74   Pulse Readings from Last 3 Encounters:  10/14/22 91  10/07/22 79  09/28/22 83   Wt Readings from Last 3 Encounters:  09/28/22 132 lb (59.9 kg)  09/19/22 138 lb 14.2 oz (63 kg)  07/23/22 134 lb (60.8 kg)   BMI  Readings from Last 3 Encounters:  09/28/22 24.14 kg/m  09/19/22 25.40 kg/m  07/23/22 24.51 kg/m    Allergies  Allergen Reactions   Amoxicillin Anaphylaxis   Cephalosporins Swelling and Rash    Lip swelling and rash on CTX and Vancomycin.   Naproxen Hives, Shortness Of Breath, Nausea And Vomiting and Swelling   Penicillins Shortness Of Breath, Swelling and Rash  Tolerated ceftriaxone 08/2022   Codeine Nausea And Vomiting   Levofloxacin Nausea And Vomiting   Shellfish Allergy Hives, Diarrhea, Nausea And Vomiting and Swelling   Vancomycin Swelling and Rash    Occurred while on CTX and Vancomycin. More likely CTX as she has anaphylaxis to PCN, but should monitor closely.    Medications Reviewed Today     Reviewed by Lebron Conners, RN (Registered Nurse) on 09/30/22 at 1237  Med List Status: RN Complete   Medication Order Taking? Sig Documenting Provider Last Dose Status Informant  acetaminophen (TYLENOL) 500 MG tablet [id number]  Take 500-1,000 mg by mouth every 8 (eight) hours as needed. [provider]  Active Child  amLODipine (NORVASC) 5 MG tablet [id number]  Take 1 tablet (5 mg total) by mouth daily. Erasmo Downer, MD  Active Child  Ascorbic Acid (VITAMIN C) 1000 MG tablet [id number]  TAKE 1 TABLET BY MOUTH EVERY MORNING [name], Marzella Schlein, MD  Active Child  Bacillus Coagulans-Inulin (PROBIOTIC-PREBIOTIC) 1-250 BILLION-MG CAPS [id number]  TAKE 1 CAPSULE BY MOUTH DAILY Erasmo Downer, MD  Active Child  BAYER LOW DOSE 81 MG tablet [id number]  TAKE 1 TABLET BY MOUTH EVERY MORNING Jacky Kindle, FNP  Active Child  Calcium Carb-Cholecalciferol (CALCIUM+D3) 600-20 MG-MCG TABS [id number]  Take 1 tablet by mouth daily. Erasmo Downer, MD  Active Child  ciprofloxacin (CIPRO) 500 MG tablet [id number]  Take 1 tablet (500 mg total) by mouth 2 (two) times daily for 10 days. Gillis Santa, MD  Active   clopidogrel (PLAVIX) 75 MG tablet [id number]  TAKE 1 TABLET BY  MOUTH DAILY [name], Marzella Schlein, MD  Active   ezetimibe (ZETIA) 10 MG tablet [id number]  Take 1 tablet (10 mg total) by mouth daily. Erasmo Downer, MD  Active Child  famotidine (PEPCID) 20 MG tablet [id number]  Take 1 tablet (20 mg total) by mouth 2 (two) times daily. Erasmo Downer, MD  Active Child  fluticasone [facility]) 50 MCG/ACT nasal spray [id number]  USE 2 SPRAYS EACH NOSTRIL DAILY  Patient taking differently: Place 1 spray into both nostrils at bedtime as needed for allergies.   Erasmo Downer, MD  Active Child  iron polysaccharides (NIFEREX) 150 MG capsule [id number]  Take 1 capsule (150 mg total) by mouth daily. Gillis Santa, MD  Active   losartan (COZAAR) 100 MG tablet [id number]  TAKE 1 TABLET BY MOUTH DAILY [name], Marzella Schlein, MD  Active   meclizine (ANTIVERT) 25 MG tablet [id number]  Take 1 tablet (25 mg total) by mouth 3 (three) times daily as needed for dizziness or nausea. Sharman Cheek, MD  Active Child  Mesalamine 800 MG TBEC [id number]  Take 800 mg by mouth in the morning, at noon, and at bedtime. [provider]  Active Child  methimazole (TAPAZOLE) 5 MG tablet [id number]  Take 5 mg by mouth. 1 Tablet on Mondays, Wednesdays and Fridays and take 1/2 tablet daily the rest of the week. [provider]  Active Child  oxybutynin (DITROPAN XL) 5 MG 24 hr tablet [id number]  Take 2 tablets (10 mg total) by mouth 3 (three) times daily as needed (for Pain due to for stent discomfort). Gillis Santa, MD  Active   Probiotic Product (PROBIOTIC DAILY PO) [id number]  Take 1 capsule by mouth daily. [provider]  Active Child  rosuvastatin (CRESTOR) 20 MG tablet [id number]  TAKE ONE-HALF TABLET BY MOUTH DAILY [name], Marzella Schlein, MD  Active Child  tamsulosin (FLOMAX) 0.4 MG CAPS  capsule [id number]  Take 1 capsule (0.4 mg total) by mouth daily. Gillis Santa, MD  Active             SDOH:  (Social Determinants of Health) assessments and  interventions performed: Yes SDOH Interventions    Flowsheet Row Telephone from 09/24/2022 in [facility] Care Coordination Telephone from 09/08/2022 in [facility] Community Care Coordination Care Coordination from 07/05/2022 in [facility] Clinical Support from 12/22/2021 in [facility] Family Practice Office Visit from 08/21/2021 in [facility] Office Visit from 04/28/2020 in [facility]  SDOH Interventions        Food Insecurity Interventions Intervention Not Indicated Intervention Not Indicated -- Intervention Not Indicated -- --  Housing Interventions Intervention Not Indicated Intervention Not Indicated -- Intervention Not Indicated -- --  Transportation Interventions Intervention Not Indicated Intervention Not Indicated Intervention Not Indicated Intervention Not Indicated, Patient Resources (Friends/Family) -- --  Depression Interventions/Treatment  -- -- -- -- PHQ2-9 Score <4 Follow-up Not Indicated PHQ2-9 Score <4 Follow-up Not Indicated  Financial Strain Interventions -- -- Other (Comment)  [PAP] Intervention Not Indicated -- --  Physical Activity Interventions -- -- -- Patient Refused -- --  Stress Interventions -- -- -- Intervention Not Indicated -- --  Social Connections Interventions -- -- -- Intervention Not Indicated -- --       Medication Assistance: Application for [place]  medication assistance program. in process.  Anticipated assistance start date TBD.  See plan of care for additional detail.  Medication Access: Within the past 30 days, how often has patient missed a dose of medication? None Is a pillbox or other method used to improve adherence? Yes  Factors that may affect medication adherence? transportation problems Are meds synced by current pharmacy? Yes  Are meds delivered by current pharmacy? Yes  Does patient experience delays in picking up medications  due to transportation concerns? No   Compliance/Adherence/Medication fill history: Care Gaps: Shgngrix  Influenza  Covid   Star-Rating Drugs: Losartan 100 mg last filled on 09/27/22 for a 30-Day supply with [facility] Rosuvastatin 20 mg last filled on 10/02/22 for a 30-Day supply with [facility]  Assessment/Plan   Hypertension (BP goal <140/90 given ongoing vertigo) -Controlled -Current treatment: Amlodipine 5 mg daily  Losartan 100 mg daily  -Medications previously tried: furosemide -Current home readings: Does not monitor routinely at home. Office blood pressure controlled today. -Reports hypotensive symptoms: dizziness/vertigo especially when getting out of bed.  -Recommended to continue current medication -Increase home monitoring to once weekly.   Hyperlipidemia: (LDL goal < 70) -Uncontrolled -History of CAD s/p PCI 1999. History of multiple TIA (last 2018), Bilateral carotid artery stenosis   -Current treatment: Ezetimibe 10 mg daily: Appropriate, Query effective Rosuvastatin 10 mg daily: Appropriate, Query effective  -Current treatment: Aspirin 81 mg daily: Appropriate, Effective, Query Safe Clopidogrel 75 mg daily: Appropriate, Effective, Query Safe  -Medications previously tried: Atorvastatin (myalgias)  -Patient declines Repatha at this time. She states that she doesn't want to add any more medications with everything else going on with her health. We discussed the importance of reducing cardiovascular risk and the potential of Repatha allowing her to discontinue her other cholesterol medications, but patient wanted to defer today.  -Query appropriate use of aspirin and clopidogrel at this time. Patient has extensive cardiac and vascular history, but no compelling indication for DAPT at this time. Will discussing potentially stepping patient down to clopidogrel monotherapy with vascular  team.   Osteoporosis / (Goal Prevent Fractures) -Uncontrolled -Last DEXA  Scan: 2012  -Patient likely a candidate for osteoporosis treatment -Current treatment  Vitamin D + Calcium daily  -Medications previously tried: Fosamax ([place] [place])  -Patient reports approximately 1 serving of soy milk daily, 3x servings of cheese weekly.  -Recommend [phone] units of vitamin D daily. Recommend 1200 mg of calcium daily from dietary and supplemental sources. -Patient refuses to restart alendronate due to challenges with when she was taking the medication. She doesn't see the need to recheck her DEXA at this point. We discussed alternative agents such as Prolia that could help reduce fracture risk but patient was not amenable at this time.   Ulcerative Colitis (Goal: Minimize flares and symptoms) -Controlled -Current treatment  Mesalamine 800 mg 2 pills 3 times a day  -Medications previously tried: Sulfasalazine   -Retry PAP for Mesalamine today.  -Continue current medications   Iron Defeciency anemia (Goal: Improve iron, hemoglobin levels) -Uncontrolled -Current treatment  Iron polysaccharides 150 mg daily: Appropriate, Query effective,   -Medications previously tried: NA -Patient reports she is unsure if she getting an iron supplement at this time. Her pharmacy has been filling Ferrex 150 mg daily, but has not been including it in her compliance packaging.  -Recommend FOBT at PCP follow-up given relatively new development of anemia.   -Recommended to continue current medication   Angelena Sole, PharmD, Patsy [place], CPP  Clinical Pharmacist Practitioner  [facility] [phone]

## 2022-10-19 LAB — CALCULI, WITH PHOTOGRAPH (CLINICAL LAB)
Carbonate Apatite: 100 %
Weight Calculi: 6 mg

## 2022-10-29 ENCOUNTER — Other Ambulatory Visit: Payer: Self-pay | Admitting: Family Medicine

## 2022-10-29 NOTE — Telephone Encounter (Signed)
Requested Prescriptions  Pending Prescriptions Disp Refills   rosuvastatin (CRESTOR) 20 MG tablet [Pharmacy Med Name: ROSUVASTATIN CALCIUM 20 MG TAB] 90 tablet 2    Sig: TAKE 1/2 TABLET BY MOUTH DAILY     Cardiovascular:  Antilipid - Statins 2 Failed - 10/29/2022  4:05 PM      Failed - Cr in normal range and within 360 days    Creatinine  Date Value Ref Range Status  06/22/2014 0.65 0.60 - 1.30 mg/dL Final   Creatinine, Ser  Date Value Ref Range Status  09/23/2022 0.41 (L) 0.44 - 1.00 mg/dL Final         Failed - Lipid Panel in normal range within the last 12 months    Cholesterol, Total  Date Value Ref Range Status  07/23/2022 184 100 - 199 mg/dL Final   LDL Chol Calc (NIH)  Date Value Ref Range Status  07/23/2022 101 (H) 0 - 99 mg/dL Final   HDL  Date Value Ref Range Status  07/23/2022 70 >39 mg/dL Final   Triglycerides  Date Value Ref Range Status  07/23/2022 71 0 - 149 mg/dL Final         Passed - Patient is not pregnant      Passed - Valid encounter within last 12 months    Recent Outpatient Visits           1 month ago Right ureteral stone    [facility] [place], Marzella Schlein, MD   3 months ago Encounter for annual physical exam   [facility] [place], Marzella Schlein, MD   10 months ago Leg swelling   [facility] [place], [place], [place]   10 months ago Leg swelling   [facility] [place], [place], [place]   11 months ago Pain and swelling of right lower leg   [facility] Bosie Clos, MD       Future Appointments             In 2 weeks [name], MD [facility] Urology    In 4 weeks [name], Marzella Schlein, MD [facility], [facility]

## 2022-11-01 ENCOUNTER — Telehealth: Payer: Self-pay

## 2022-11-01 NOTE — Progress Notes (Signed)
Thank you, I will let Shawnette know.   Angelena Sole, PharmD, Patsy [place], CPP  Clinical Pharmacist Practitioner  [facility] [phone]

## 2022-11-02 NOTE — Progress Notes (Signed)
Care Coordination Pharmacy Assistant   Name: LUNAH AROYO  MRN: [id number] DOB: [birth year]  Reason for Encounter: Medication Management  I received a message from Angelena Sole, CPP requesting that I contact the patient to advise her of the below:  "for Misty Page, can you please call her and inform her that I spoke with Dr. Gilda Crease regarding her care, and we were in agreement that she does not need to continue taking aspirin at this time. At this point I would recommend she stop aspirin to minimize the amount of medications she takes and minimize the risk of having a serious bleed.   I spoke to patient and advised  that per CPP he spoke with Dr. Gilda Crease, and they both agree that patient should discontinue taking aspirin at this in order to minimize the amount of medications she is taking and to minimize the risk of having a serious bleed. Patient stated she would not take the aspirin any more. Patient verbalized understanding to all.  Medications: Outpatient Encounter Medications as of 11/01/2022  Medication Sig   acetaminophen (TYLENOL) 500 MG tablet Take 500-1,000 mg by mouth every 8 (eight) hours as needed.   amLODipine (NORVASC) 5 MG tablet Take 1 tablet (5 mg total) by mouth daily.   Ascorbic Acid (VITAMIN C) 1000 MG tablet TAKE 1 TABLET BY MOUTH EVERY MORNING   Bacillus Coagulans-Inulin (PROBIOTIC-PREBIOTIC) 1-250 BILLION-MG CAPS TAKE 1 CAPSULE BY MOUTH DAILY   BAYER LOW DOSE 81 MG tablet TAKE 1 TABLET BY MOUTH EVERY MORNING   Calcium Carb-Cholecalciferol (CALCIUM+D3) 600-20 MG-MCG TABS Take 1 tablet by mouth daily.   clopidogrel (PLAVIX) 75 MG tablet TAKE 1 TABLET BY MOUTH DAILY   ezetimibe (ZETIA) 10 MG tablet Take 1 tablet (10 mg total) by mouth daily.   famotidine (PEPCID) 20 MG tablet Take 1 tablet (20 mg total) by mouth 2 (two) times daily.   fluticasone (FLONASE) 50 MCG/ACT nasal spray USE 2 SPRAYS EACH NOSTRIL DAILY (Patient taking differently: Place 1 spray into both nostrils  at bedtime as needed for allergies.)   HYDROcodone-acetaminophen (NORCO/VICODIN) 5-325 MG tablet Take 1-2 tablets by mouth every 6 (six) hours as needed for moderate pain.   iron polysaccharides (NIFEREX) 150 MG capsule Take 1 capsule (150 mg total) by mouth daily.   losartan (COZAAR) 100 MG tablet TAKE 1 TABLET BY MOUTH DAILY   meclizine (ANTIVERT) 25 MG tablet Take 1 tablet (25 mg total) by mouth 3 (three) times daily as needed for dizziness or nausea.   Mesalamine 800 MG TBEC Take 800 mg by mouth in the morning, at noon, and at bedtime.   methimazole (TAPAZOLE) 5 MG tablet Take 5 mg by mouth. 1 Tablet on Mondays, Wednesdays and Fridays and take 1/2 tablet daily the rest of the week.   oxybutynin (DITROPAN XL) 5 MG 24 hr tablet Take 2 tablets (10 mg total) by mouth 3 (three) times daily as needed (for Pain due to for stent discomfort).   Probiotic Product (PROBIOTIC DAILY PO) Take 1 capsule by mouth daily.   rosuvastatin (CRESTOR) 20 MG tablet TAKE 1/2 TABLET BY MOUTH DAILY   sulfamethoxazole-trimethoprim (BACTRIM DS) 800-160 MG tablet Take 1 tablet by mouth 2 (two) times daily.   Facility-Administered Encounter Medications as of 11/01/2022  Medication   sulfamethoxazole-trimethoprim (BACTRIM DS) 800-160 MG per tablet 1 tablet    Adelene Idler, CPA/CMA Clinical Pharmacist Assistant Phone: [phone]

## 2022-11-02 NOTE — Addendum Note (Signed)
Addended by: Julious Payer A on: 11/02/2022 02:10 PM   Modules accepted: Orders

## 2022-11-11 ENCOUNTER — Encounter: Payer: PPO | Admitting: *Deleted

## 2022-11-12 ENCOUNTER — Ambulatory Visit
Admission: RE | Admit: 2022-11-12 | Discharge: 2022-11-12 | Disposition: A | Discharge status: Home / Self Care | Payer: Medicare HMO | Source: Ambulatory Visit | Attending: Urology | Admitting: Urology

## 2022-11-12 ENCOUNTER — Other Ambulatory Visit: Payer: Self-pay | Admitting: Family Medicine

## 2022-11-12 DIAGNOSIS — N200 Calculus of kidney: Secondary | ICD-10-CM | POA: Diagnosis not present

## 2022-11-12 DIAGNOSIS — N201 Calculus of ureter: Secondary | ICD-10-CM | POA: Diagnosis not present

## 2022-11-12 DIAGNOSIS — Z466 Encounter for fitting and adjustment of urinary device: Secondary | ICD-10-CM | POA: Insufficient documentation

## 2022-11-12 NOTE — Telephone Encounter (Signed)
Requested Prescriptions  Pending Prescriptions Disp Refills   Ascorbic Acid (VITAMIN C) 1000 MG tablet [Pharmacy Med Name: VITAMIN C 1000 MG TAB] 100 tablet 0    Sig: TAKE 1 TABLET BY MOUTH EVERY MORNING     Endocrinology:  Vitamins Passed - 11/12/2022 10:49 AM      Passed - Valid encounter within last 12 months    Recent Outpatient Visits           1 month ago Right ureteral stone    [facility] [place], Marzella Schlein, MD   3 months ago Encounter for annual physical exam   [facility] [place], Marzella Schlein, MD   10 months ago Leg swelling   [facility] [place], [place], [place]   11 months ago Leg swelling   [facility] [place], [place], [place]   12 months ago Pain and swelling of right lower leg   [facility] Bosie Clos, MD       Future Appointments             In 5 days [name], MD [facility]rology [place]   In 2 weeks [name], Marzella Schlein, MD [facility], [facility]

## 2022-11-17 ENCOUNTER — Ambulatory Visit: Payer: Medicare HMO | Admitting: Urology

## 2022-11-17 VITALS — BP 153/71 | HR 82

## 2022-11-17 DIAGNOSIS — N200 Calculus of kidney: Secondary | ICD-10-CM | POA: Diagnosis not present

## 2022-11-17 NOTE — Progress Notes (Signed)
Misty Page presents for an office visit. BP today is __153/71_________. She is complaint with BP medication. Greater than 140/90. Provider  notified. Pt advised to__f/u with PCP____________. Pt voiced understanding.

## 2022-11-17 NOTE — Progress Notes (Signed)
Marcelle Overlie Plume,acting as a scribe for [name], MD.,have documented all relevant documentation on the behalf of [name], MD,as directed by  [name], MD while in the presence of [name], MD.  11/17/2022 11:35 AM   Misty Page 1940-11-28 [id number]  Referring provider: Erasmo Downer, MD [address] [place],  [name] [address]  Chief Complaint  Patient presents with   Results    HPI: 82 year-old female initially seen and evaluated on 09/19/2022 with right flank pain and a 7 mm right proximal ureteral calculus with concern for sepsis.   She underwent urgent right ureteral stent placement. Her infection was treated, and ultimately she returned to the operating room on 10/07/2022 for definitive management of her stone in the form of right ureteroscopy with laser lithotripsy. Her stent was subsequently removed a week later in the office. At the time, she was unable to give [place] a urine sample, but her urine was cloudy, and we treated her with additional Bactrim. Stone analysis was consistent with calcium phosphate, 100%.   She follows up today with a renal ultrasound completed on 11/12/2022, which shows no hydronephrosis but does show some stones in the right kidney which are non-obstructing.   PMH: Past Medical History:  Diagnosis Date   Allergy    Anemia    Arthritis    CA of skin 10/15/2014   Calculus of kidney 10/15/2014   Seen in ER 06/16/10.    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   Coronary artery disease    Diverticulitis    Environmental and seasonal allergies    Fibrocystic breast disease (FCBD)    GERD (gastroesophageal reflux disease)    Heart murmur    High cholesterol    History of heart artery stent    History of kidney stones    Hyperlipidemia    Hypertension    Hyperthyroidism    Myocardial infarction (HCC)    Osteomyelitis of left femur (HCC)  11/30/2018   Osteoporosis    PONV (postoperative nausea and vomiting)    Reactive depression 02/25/2019   Thyroid disease    TIA (transient ischemic attack)     Surgical History: Past Surgical History:  Procedure Laterality Date   ABDOMINAL HYSTERECTOMY  2007   BREAST ENHANCEMENT SURGERY  2004   BREAST IMPLANT REMOVAL     BREAST SURGERY  1984   Fibrocystic Disease   CAROTID ENDARTERECTOMY Left 06/21/2014   Dr. Gilda Crease   CAROTID ENDARTERECTOMY Right 08/02/2014   Dr. Gilda Crease   CAROTID PTA/STENT INTERVENTION N/A 06/15/2017   Procedure: CAROTID PTA/STENT INTERVENTION;  Surgeon: Renford Dills, MD;  Location: [facility];  Service: Cardiovascular;  Laterality: N/A;   COLONOSCOPY WITH PROPOFOL N/A 07/14/2015   Procedure: COLONOSCOPY WITH PROPOFOL;  Surgeon: Wallace Cullens, MD;  Location: [facility]COPY;  Service: Gastroenterology;  Laterality: N/A;   COLONOSCOPY WITH PROPOFOL N/A 02/08/2018   Procedure: COLONOSCOPY WITH PROPOFOL;  Surgeon: Toledo, Boykin Nearing, MD;  Location: [facility] ENDOSCOPY;  Service: Gastroenterology;  Laterality: N/A;   CORONARY ANGIOPLASTY WITH STENT PLACEMENT  1999   CYSTOSCOPY WITH STENT PLACEMENT Right 09/20/2022   Procedure: CYSTOSCOPY WITH STENT PLACEMENT;  Surgeon: [name], MD;  Location: [facility] ORS;  Service: Urology;  Laterality: Right;   CYSTOSCOPY/URETEROSCOPY/HOLMIUM LASER/STENT PLACEMENT Right 10/07/2022   Procedure: CYSTOSCOPY/URETEROSCOPY/HOLMIUM LASER/STENT PLACEMENT;  Surgeon: [name], MD;  Location: [facility];  Service:  Urology;  Laterality: Right;   ESOPHAGOGASTRODUODENOSCOPY (EGD) WITH PROPOFOL N/A 02/08/2018   Procedure: ESOPHAGOGASTRODUODENOSCOPY (EGD) WITH PROPOFOL;  Surgeon: Toledo, Boykin Nearing, MD;  Location: [facility]OS[facility];  Service: Gastroenterology;  Laterality: N/A;   FEMORAL ARTERY STENT  08/2003   HERNIA REPAIR  2008   LEG AMPUTATION AT HIP Left 02/28/2019   MASTECTOMY     RENAL ARTERY STENT  08/2003   TRANSUMBILICAL  AUGMENTATION MAMMAPLASTY     VASCULAR SURGERY     VISCERAL ARTERY INTERVENTION N/A 02/13/2020   Procedure: VISCERAL ARTERY INTERVENTION;  Surgeon: Annice Needy, MD;  Location: [facility];  Service: Cardiovascular;  Laterality: N/A;    Home Medications:  Allergies as of 11/17/2022       Reactions   Amoxicillin Anaphylaxis   Cephalosporins Swelling, Rash   Lip swelling and rash on CTX and Vancomycin.   Naproxen Hives, Shortness Of Breath, Nausea And Vomiting, Swelling   Penicillins Shortness Of Breath, Swelling, Rash   Tolerated ceftriaxone 08/2022   Codeine Nausea And Vomiting   Levofloxacin Nausea And Vomiting   Shellfish Allergy Hives, Diarrhea, Nausea And Vomiting, Swelling   Vancomycin Swelling, Rash   Occurred while on CTX and Vancomycin. More likely CTX as she has anaphylaxis to PCN, but should monitor closely.        Medication List        Accurate as of November 17, 2022 11:35 AM. If you have any questions, ask your nurse or doctor.          STOP taking these medications    sulfamethoxazole-trimethoprim 800-160 MG tablet Commonly known as: BACTRIM DS       TAKE these medications    acetaminophen 500 MG tablet Commonly known as: TYLENOL Take 500-1,000 mg by mouth every 8 (eight) hours as needed.   amLODipine 5 MG tablet Commonly known as: NORVASC Take 1 tablet (5 mg total) by mouth daily.   Calcium Carb-Cholecalciferol 600-20 MG-MCG Tabs Commonly known as: Calcium+D3 Take 1 tablet by mouth daily.   clopidogrel 75 MG tablet Commonly known as: PLAVIX TAKE 1 TABLET BY MOUTH DAILY   ezetimibe 10 MG tablet Commonly known as: ZETIA Take 1 tablet (10 mg total) by mouth daily.   famotidine 20 MG tablet Commonly known as: PEPCID Take 1 tablet (20 mg total) by mouth 2 (two) times daily.   fluticasone 50 MCG/ACT nasal spray Commonly known as: FLONASE USE 2 SPRAYS EACH NOSTRIL DAILY What changed:  how much to take how to take this when to take  this reasons to take this additional instructions   HYDROcodone-acetaminophen 5-325 MG tablet Commonly known as: NORCO/VICODIN Take 1-2 tablets by mouth every 6 (six) hours as needed for moderate pain.   iron polysaccharides 150 MG capsule Commonly known as: NIFEREX Take 1 capsule (150 mg total) by mouth daily.   losartan 100 MG tablet Commonly known as: COZAAR TAKE 1 TABLET BY MOUTH DAILY   meclizine 25 MG tablet Commonly known as: ANTIVERT Take 1 tablet (25 mg total) by mouth 3 (three) times daily as needed for dizziness or nausea.   Mesalamine 800 MG Tbec Take 800 mg by mouth in the morning, at noon, and at bedtime.   methimazole 5 MG tablet Commonly known as: TAPAZOLE Take 5 mg by mouth. 1 Tablet on Mondays, Wednesdays and Fridays and take 1/2 tablet daily the rest of the week.   PROBIOTIC DAILY PO Take 1 capsule by mouth daily.   Probiotic-Prebiotic 1-250 BILLION-MG Caps TAKE  1 CAPSULE BY MOUTH DAILY   rosuvastatin 20 MG tablet Commonly known as: CRESTOR TAKE 1/2 TABLET BY MOUTH DAILY   vitamin C 1000 MG tablet TAKE 1 TABLET BY MOUTH EVERY MORNING        Allergies:  Allergies  Allergen Reactions   Amoxicillin Anaphylaxis   Cephalosporins Swelling and Rash    Lip swelling and rash on CTX and Vancomycin.   Naproxen Hives, Shortness Of Breath, Nausea And Vomiting and Swelling   Penicillins Shortness Of Breath, Swelling and Rash    Tolerated ceftriaxone 08/2022   Codeine Nausea And Vomiting   Levofloxacin Nausea And Vomiting   Shellfish Allergy Hives, Diarrhea, Nausea And Vomiting and Swelling   Vancomycin Swelling and Rash    Occurred while on CTX and Vancomycin. More likely CTX as she has anaphylaxis to PCN, but should monitor closely.    Family History: Family History  Problem Relation Age of Onset   Hyperlipidemia Mother    Congestive Heart Failure Mother    COPD Mother    Heart disease Mother    Hypertension Mother    Osteoporosis Mother     Pancreatic cancer Father    Arthritis Sister    Alzheimer's disease Sister    Hypertension Sister    Prostate cancer Brother    Heart attack Brother    Stomach cancer Brother    Kidney failure Brother    Leukemia Brother    Emphysema Brother    Pancreatic cancer Brother    Stomach cancer Brother    Breast cancer Paternal Grandmother    Leukemia Paternal Grandfather    Heart attack Maternal Uncle    Stroke Maternal Aunt     Social History:  reports that she quit smoking about 28 years ago. Her smoking use included cigarettes. She has never used smokeless tobacco. She reports that she does not currently use alcohol. She reports that she does not use drugs.   Physical Exam: BP (!) 153/71   Pulse 82   Constitutional:  Alert and oriented, No acute distress. HEENT: [place] AT, moist mucus membranes.  Trachea midline, no masses. Neurologic: Grossly intact, no focal deficits, moving all 4 extremities. Psychiatric: Normal mood and affect.  Pertinent Imaging:  Ultrasound renal complete  Narrative CLINICAL DATA:  Status post stent removal.  EXAM: RENAL / URINARY TRACT ULTRASOUND COMPLETE  COMPARISON:  Renal stone CT 09/19/2022  FINDINGS: Right Kidney:  Renal measurements: 11.3 x 4.9 x 5.4 cm = volume: 155.9 mL. Normal renal cortical thickness and echogenicity. No hydronephrosis. There is a 6 mm stone within the inferior pole of the right kidney and a 9 mm stone within the superior pole of the right kidney.  Left Kidney:  Renal measurements: 12.0 x 5.6 x 4.7 cm = volume: 165.7 mL. Normal renal cortical thickness and echogenicity. No hydronephrosis. There is a 3.7 cm partially exophytic cyst lower pole. No imaging follow-up is needed. Punctate calcification is demonstrated within the renal hila, likely combination of small renal stones and vascular calcifications.  Bladder:  Appears normal for degree of bladder distention.  Other:  None.  IMPRESSION: 1. No  hydronephrosis. 2. Bilateral nephrolithiasis.   Electronically Signed By: Annia Belt M.D. On: 11/12/2022 14:42  This was personally reviewed and I agree with the radiologic interpretation.  Assessment & Plan:    1. Right kidney stones - Likely stone debris from ureteroscopic intervention or Randall's plaque based on her preoperative CT scan - Plan to manage these conservatively with a KUB  in a year. - We discussed general stone prevention techniques including drinking plenty water with goal of producing 2.5 L urine daily, increased citric acid intake, and decreased salt intake.  Information about dietary recommendations given today.  - Provided a booklet on kidney stone prevention.  Return in about 1 year (around 11/17/2023) for repeat KUB. Follow up sooner if experiencing pain, signs of infection, or hematuria.  I have reviewed the above documentation for accuracy and completeness, and I agree with the above.   [name], MD   [facility] [address] [place], [name] [address] [phone]

## 2022-11-26 ENCOUNTER — Encounter: Payer: Self-pay | Admitting: Family Medicine

## 2022-11-26 ENCOUNTER — Ambulatory Visit (INDEPENDENT_AMBULATORY_CARE_PROVIDER_SITE_OTHER): Payer: Medicare HMO | Admitting: Family Medicine

## 2022-11-26 VITALS — BP 129/71 | HR 82 | Temp 98.0°F | Resp 12 | Ht 62.0 in | Wt 123.7 lb

## 2022-11-26 DIAGNOSIS — D509 Iron deficiency anemia, unspecified: Secondary | ICD-10-CM

## 2022-11-26 DIAGNOSIS — Z89612 Acquired absence of left leg above knee: Secondary | ICD-10-CM | POA: Diagnosis not present

## 2022-11-26 DIAGNOSIS — G546 Phantom limb syndrome with pain: Secondary | ICD-10-CM | POA: Diagnosis not present

## 2022-11-26 MED ORDER — GABAPENTIN 300 MG PO CAPS: 300.0000 mg | ORAL_CAPSULE | Freq: Three times a day (TID) | ORAL | 3 refills | Status: DC | PRN

## 2022-11-26 MED ORDER — POLYSACCHARIDE IRON COMPLEX 150 MG PO CAPS: 150.0000 mg | ORAL_CAPSULE | Freq: Every day | ORAL | 3 refills | Status: DC

## 2022-11-26 NOTE — Addendum Note (Signed)
Addended by: Erasmo Downer on: 11/26/2022 01:26 PM   Modules accepted: Level of Service

## 2022-11-26 NOTE — Progress Notes (Signed)
I,Sulibeya S Dimas,acting as a scribe for Shirlee Latch, MD.,have documented all relevant documentation on the behalf of Shirlee Latch, MD,as directed by  Shirlee Latch, MD while in the presence of Shirlee Latch, MD.   Established patient visit   Patient: Misty Page   DOB: [birth year]   82 y.o. Female  MRN: [id number] Visit Date: 11/26/2022  Today's healthcare provider: Shirlee Latch, MD   Chief Complaint  Patient presents with   Medical Management of Chronic Issues   Subjective    HPI  Follow up for anemia  The patient was last seen for this 1 months ago. Changes made at last visit include continue PO iron.  She reports she has been out of iron medication for about 2 months. She feels that condition is Worse. Feeling more tired. She is not having side effects.  -----------------------------------------------------------------------------------------  Patient C/O recurrent phantom pain.  Discussed the use of AI scribe software for clinical note transcription with the patient, who gave verbal consent to proceed.  History of Present Illness   The patient, with a history of iron deficiency anemia and peripheral artery disease, presents with worsening anemia. They report not taking iron pills for a couple of months, which has resulted in a decrease in their blood count. The patient has a history of low blood count and has been anemic for as long as they can remember. They have previously been on iron pills, which have proven to be effective in managing their anemia.  The patient also reports being taken off aspirin while on Plavix, expressing confusion about this change in their medication regimen. They were told that being on both medications could increase their risk of bleeding in case of an accident, from unknown person, maybe pharmacist. However, they have not experienced any bleeding symptoms.  The patient has been experiencing phantom pain from a  previous amputation. They describe the pain as being similar to labor pains, occurring at regular intervals and preventing them from sleeping. The pain is not constant and varies in intensity.  The patient also mentions having kidney stones, which are embedded and unlikely to move. They express concern about not feeling like they fully empty their bladder when urinating and needing to go more often than they used to.        Medications: Outpatient Medications Prior to Visit  Medication Sig   acetaminophen (TYLENOL) 500 MG tablet Take 500-1,000 mg by mouth every 8 (eight) hours as needed.   amLODipine (NORVASC) 5 MG tablet Take 1 tablet (5 mg total) by mouth daily.   Ascorbic Acid (VITAMIN C) 1000 MG tablet TAKE 1 TABLET BY MOUTH EVERY MORNING   Bacillus Coagulans-Inulin (PROBIOTIC-PREBIOTIC) 1-250 BILLION-MG CAPS TAKE 1 CAPSULE BY MOUTH DAILY   Calcium Carb-Cholecalciferol (CALCIUM+D3) 600-20 MG-MCG TABS Take 1 tablet by mouth daily.   clopidogrel (PLAVIX) 75 MG tablet TAKE 1 TABLET BY MOUTH DAILY   ezetimibe (ZETIA) 10 MG tablet Take 1 tablet (10 mg total) by mouth daily.   famotidine (PEPCID) 20 MG tablet Take 1 tablet (20 mg total) by mouth 2 (two) times daily.   fluticasone (FLONASE) 50 MCG/ACT nasal spray USE 2 SPRAYS EACH NOSTRIL DAILY (Patient taking differently: Place 1 spray into both nostrils at bedtime as needed for allergies.)   HYDROcodone-acetaminophen (NORCO/VICODIN) 5-325 MG tablet Take 1-2 tablets by mouth every 6 (six) hours as needed for moderate pain.   losartan (COZAAR) 100 MG tablet TAKE 1 TABLET BY MOUTH DAILY   meclizine (ANTIVERT) 25  MG tablet Take 1 tablet (25 mg total) by mouth 3 (three) times daily as needed for dizziness or nausea.   Mesalamine 800 MG TBEC Take 800 mg by mouth in the morning, at noon, and at bedtime.   methimazole (TAPAZOLE) 5 MG tablet Take 5 mg by mouth. 1 Tablet on Mondays, Wednesdays and Fridays and take 1/2 tablet daily the rest of the week.    Probiotic Product (PROBIOTIC DAILY PO) Take 1 capsule by mouth daily.   rosuvastatin (CRESTOR) 20 MG tablet TAKE 1/2 TABLET BY MOUTH DAILY   iron polysaccharides (NIFEREX) 150 MG capsule Take 1 capsule (150 mg total) by mouth daily.   Facility-Administered Medications Prior to Visit  Medication Dose Route Frequency Provider   sulfamethoxazole-trimethoprim (BACTRIM DS) 800-160 MG per tablet 1 tablet  1 tablet Oral Q12H [name], MD    Review of Systems     Objective    BP 129/71 (BP Location: Right Arm, Patient Position: Sitting, Cuff Size: Normal)   Pulse 82   Temp 98 F (36.7 C) (Temporal)   Resp 12   Ht 5\' 2"  (1.575 m)   Wt 123 lb 11.2 oz (56.1 kg)   SpO2 96%   BMI 22.63 kg/m    Physical Exam Vitals reviewed.  Constitutional:      General: She is not in acute distress.    Appearance: Normal appearance. She is well-developed. She is not diaphoretic.  HENT:     Head: Normocephalic and atraumatic.  Eyes:     General: No scleral icterus.    Conjunctiva/sclera: Conjunctivae normal.  Neck:     Thyroid: No thyromegaly.  Cardiovascular:     Rate and Rhythm: Normal rate and regular rhythm.     Heart sounds: Murmur heard.  Pulmonary:     Effort: Pulmonary effort is normal. No respiratory distress.     Breath sounds: Normal breath sounds. No wheezing, rhonchi or rales.  Musculoskeletal:     Cervical back: Neck supple.     Right lower leg: No edema.     Left lower leg: No edema.  Lymphadenopathy:     Cervical: No cervical adenopathy.  Skin:    General: Skin is warm and dry.     Findings: No rash.  Neurological:     Mental Status: She is alert and oriented to person, place, and time. Mental status is at baseline.  Psychiatric:        Mood and Affect: Mood normal.        Behavior: Behavior normal.       No results found for any visits on 11/26/22.  Assessment & Plan     Problem List Items Addressed This Visit       Nervous and Auditory   Phantom limb  pain (HCC)   Relevant Medications   gabapentin (NEURONTIN) 300 MG capsule     Other   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) - Primary   Iron deficiency anemia   Relevant Medications   iron polysaccharides (NIFEREX) 150 MG capsule   Other Relevant Orders   CBC w/Diff/Platelet   Iron, TIBC and Ferritin Panel     Assessment and Plan    Iron Deficiency Anemia: Chronic condition, worsened recently due to discontinuation of iron supplementation. -Resume iron supplementation daily. -Check blood counts and iron levels in one month (late July/early August 2024).  Vascular Disease: On dual antiplatelet therapy (Aspirin and Plavix) for high clotting risk due to atherosclerosis and peripheral artery disease. -  Continue Aspirin and Plavix daily.  Phantom Limb Pain: Recurrent episodes, severe enough to disrupt sleep. -Prescribe Gabapentin, 30 pills with refills, to be taken as needed for pain episodes.  Kidney Stones: Chronic condition, currently asymptomatic. -No change in management.  Follow-up in three months for regular chronic disease management.        No follow-ups on file.      I, Shirlee Latch, MD, have reviewed all documentation for this visit. The documentation on 11/26/22 for the exam, diagnosis, procedures, and orders are all accurate and complete.   Sherlonda Flater, Marzella Schlein, MD, MPH [facility]

## 2022-11-29 ENCOUNTER — Ambulatory Visit: Payer: Self-pay | Admitting: *Deleted

## 2022-11-29 NOTE — Patient Outreach (Signed)
  Care Coordination   Follow Up Visit Note   11/29/2022 Name: Misty Page MRN: [id number] DOB: 1940-10-15  Misty Page is a 82 y.o. year old female who sees [name], Marzella Schlein, MD for primary care. I spoke with  [name] by phone today.  What matters to the patients health and wellness today?  Admitted to hospital in April for kidney stones, surgical procedure on 5/9 for stones, report she has been much better since    Goals Addressed             This Visit's Progress    Effective management of chronic conditions   On track    Care Coordination Interventions: Evaluation of current treatment plan related to HTN, previous cancer patient, CAD  and patient's adherence to plan as established by provider Reviewed medications with patient and discussed affordability and adherence Reviewed scheduled/upcoming provider appointments including will have PCP visit in Oct Discussed plans with patient for ongoing care management follow up and provided patient with direct contact information for care management team         SDOH assessments and interventions completed:  No     Care Coordination Interventions:  Yes, provided   Interventions Today    Flowsheet Row Most Recent Value  Chronic Disease   Chronic disease during today's visit Other  [kidney stones requiring ureteroscopy with laser lithotripsy]  General Interventions   General Interventions Discussed/Reviewed Doctor Visits, General Interventions Reviewed  Doctor Visits Discussed/Reviewed Doctor Visits Reviewed, PCP, Specialist, Annual Wellness Visits  [AWV 7/29, PCP 10/4, and urology in 1 year, June 2025]  PCP/Specialist Visits Compliance with follow-up visit  Education Interventions   Education Provided Provided Education  Provided Verbal Education On When to see the doctor, Other  [blood pressure monitoring, advised to monitor daily and educated on signs of UTI and kidney stones]       Follow up plan: Follow up  call scheduled for 10/8    Encounter Outcome:  Pt. Visit Completed   Kemper Durie, RN, MSN, [facility] Care Management Care Management Coordinator [phone]

## 2022-12-22 DIAGNOSIS — D509 Iron deficiency anemia, unspecified: Secondary | ICD-10-CM | POA: Diagnosis not present

## 2022-12-23 LAB — CBC WITH DIFFERENTIAL/PLATELET
Basophils Absolute: 0.1 10*3/uL (ref 0.0–0.2)
Basos: 1 %
EOS (ABSOLUTE): 0.2 10*3/uL (ref 0.0–0.4)
Hematocrit: 30.2 % — ABNORMAL LOW (ref 34.0–46.6)
Hemoglobin: 8.5 g/dL — ABNORMAL LOW (ref 11.1–15.9)
Immature Grans (Abs): 0 10*3/uL (ref 0.0–0.1)
Immature Granulocytes: 0 %
Lymphocytes Absolute: 0.9 10*3/uL (ref 0.7–3.1)
Lymphs: 16 %
MCH: 20.3 pg — ABNORMAL LOW (ref 26.6–33.0)
MCV: 72 fL — ABNORMAL LOW (ref 79–97)
Monocytes Absolute: 0.6 10*3/uL (ref 0.1–0.9)
Monocytes: 10 %
Neutrophils Absolute: 4 10*3/uL (ref 1.4–7.0)
Platelets: 468 10*3/uL — ABNORMAL HIGH (ref 150–450)
RDW: 21.1 % — ABNORMAL HIGH (ref 11.7–15.4)

## 2022-12-23 LAB — IRON,TIBC AND FERRITIN PANEL
Ferritin: 9 ng/mL — ABNORMAL LOW (ref 15–150)
Iron Saturation: 10 % — ABNORMAL LOW (ref 15–55)
Total Iron Binding Capacity: 479 ug/dL — ABNORMAL HIGH (ref 250–450)
UIBC: 432 ug/dL — ABNORMAL HIGH (ref 118–369)

## 2022-12-27 ENCOUNTER — Ambulatory Visit (INDEPENDENT_AMBULATORY_CARE_PROVIDER_SITE_OTHER): Payer: Medicare HMO

## 2022-12-27 VITALS — Ht 62.0 in | Wt 123.0 lb

## 2022-12-27 DIAGNOSIS — Z0000 Encounter for general adult medical examination without abnormal findings: Secondary | ICD-10-CM

## 2022-12-27 NOTE — Progress Notes (Signed)
Subjective:   Misty Page is a 82 y.o. female who presents for Medicare Annual (Subsequent) preventive examination.  Visit Complete: Virtual  I connected with  Misty Page on 12/27/22 by a audio enabled telemedicine application and verified that I am speaking with the correct person using two identifiers.  Vital Signs: Unable to obtain new vitals due to this being a telehealth visit.  Patient Location: Home  Provider Location: Office/Clinic  I discussed the limitations of evaluation and management by telemedicine. The patient expressed understanding and agreed to proceed.  Patient Medicare AWV questionnaire was completed by the patient on (not done); I have confirmed that all information answered by patient is correct and no changes since this date.  Review of Systems    Cardiac Risk Factors include: advanced age (>51men, >26 women);dyslipidemia;hypertension;sedentary lifestyle    Objective:    Today's Vitals   12/27/22 0818  Weight: 123 lb (55.8 kg)  Height: 5\' 2"  (1.575 m)   Body mass index is 22.5 kg/m.     12/27/2022    8:43 AM 10/07/2022    9:27 AM 09/30/2022   12:43 PM 09/19/2022   10:00 PM 12/22/2021    9:10 AM 04/13/2021   10:00 AM 04/12/2021    2:43 PM  Advanced Directives  Does Patient Have a Medical Advance Directive? Yes Yes Yes Yes No Yes Yes  Type of Estate agent of [place];Living will Living will;Healthcare Power of [employer] Power of [place];Living will  Healthcare Power of [place];Living will Healthcare Power of [place];Living will  Does patient want to make changes to medical advance directive?  No - Patient declined  No - Patient declined  No - Patient declined   Copy of Healthcare Power of Attorney in Chart?  No - copy requested  No - copy requested  No - copy requested No - copy requested  Would patient like information on creating a medical advance directive?     No - Patient declined  No - Patient declined     Current Medications (verified) Outpatient Encounter Medications as of 12/27/2022  Medication Sig   acetaminophen (TYLENOL) 500 MG tablet Take 500-1,000 mg by mouth every 8 (eight) hours as needed.   amLODipine (NORVASC) 5 MG tablet Take 1 tablet (5 mg total) by mouth daily.   Ascorbic Acid (VITAMIN C) 1000 MG tablet TAKE 1 TABLET BY MOUTH EVERY MORNING   aspirin EC 81 MG tablet Take 81 mg by mouth daily. Swallow whole.   Bacillus Coagulans-Inulin (PROBIOTIC-PREBIOTIC) 1-250 BILLION-MG CAPS TAKE 1 CAPSULE BY MOUTH DAILY   Calcium Carb-Cholecalciferol (CALCIUM+D3) 600-20 MG-MCG TABS Take 1 tablet by mouth daily.   clopidogrel (PLAVIX) 75 MG tablet TAKE 1 TABLET BY MOUTH DAILY   ezetimibe (ZETIA) 10 MG tablet Take 1 tablet (10 mg total) by mouth daily.   famotidine (PEPCID) 20 MG tablet Take 1 tablet (20 mg total) by mouth 2 (two) times daily.   fluticasone (FLONASE) 50 MCG/ACT nasal spray USE 2 SPRAYS EACH NOSTRIL DAILY (Patient taking differently: Place 1 spray into both nostrils at bedtime as needed for allergies.)   gabapentin (NEURONTIN) 300 MG capsule Take 1 capsule (300 mg total) by mouth 3 (three) times daily as needed (phantom limb pain).   iron polysaccharides (NIFEREX) 150 MG capsule Take 1 capsule (150 mg total) by mouth daily.   losartan (COZAAR) 100 MG tablet TAKE 1 TABLET BY MOUTH DAILY   meclizine (ANTIVERT) 25 MG tablet Take 1 tablet (25 mg total)  by mouth 3 (three) times daily as needed for dizziness or nausea.   Mesalamine 800 MG TBEC Take 800 mg by mouth in the morning, at noon, and at bedtime.   methimazole (TAPAZOLE) 5 MG tablet Take 5 mg by mouth. 1 Tablet on Mondays, Wednesdays and Fridays and take 1/2 tablet daily the rest of the week.   Probiotic Product (PROBIOTIC DAILY PO) Take 1 capsule by mouth daily.   rosuvastatin (CRESTOR) 20 MG tablet TAKE 1/2 TABLET BY MOUTH DAILY   HYDROcodone-acetaminophen (NORCO/VICODIN) 5-325 MG tablet Take 1-2 tablets by mouth every 6  (six) hours as needed for moderate pain. (Patient not taking: Reported on 12/27/2022)   Facility-Administered Encounter Medications as of 12/27/2022  Medication   sulfamethoxazole-trimethoprim (BACTRIM DS) 800-160 MG per tablet 1 tablet    Allergies (verified) Amoxicillin, Cephalosporins, Naproxen, Penicillins, Codeine, Levofloxacin, Shellfish allergy, and Vancomycin   History: Past Medical History:  Diagnosis Date   Allergy    Anemia    Arthritis    CA of skin 10/15/2014   Calculus of kidney 10/15/2014   Seen in ER 06/16/10.    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   Coronary artery disease    Diverticulitis    Environmental and seasonal allergies    Fibrocystic breast disease (FCBD)    GERD (gastroesophageal reflux disease)    Heart murmur    High cholesterol    History of heart artery stent    History of kidney stones    Hyperlipidemia    Hypertension    Hyperthyroidism    Myocardial infarction (HCC)    Osteomyelitis of left femur (HCC) 11/30/2018   Osteoporosis    PONV (postoperative nausea and vomiting)    Reactive depression 02/25/2019   Thyroid disease    TIA (transient ischemic attack)    Past Surgical History:  Procedure Laterality Date   ABDOMINAL HYSTERECTOMY  2007   BREAST ENHANCEMENT SURGERY  2004   BREAST IMPLANT REMOVAL     BREAST SURGERY  1984   Fibrocystic Disease   CAROTID ENDARTERECTOMY Left 06/21/2014   Dr. Gilda Crease   CAROTID ENDARTERECTOMY Right 08/02/2014   Dr. Gilda Crease   CAROTID PTA/STENT INTERVENTION N/A 06/15/2017   Procedure: CAROTID PTA/STENT INTERVENTION;  Surgeon: Renford Dills, MD;  Location: [facility] INVASIVE CV LAB;  Service: Cardiovascular;  Laterality: N/A;   COLONOSCOPY WITH PROPOFOL N/A 07/14/2015   Procedure: COLONOSCOPY WITH PROPOFOL;  Surgeon: Wallace Cullens, MD;  Location: [facility] ENDOSCOPY;  Service: Gastroenterology;  Laterality: N/A;   COLONOSCOPY WITH PROPOFOL N/A  02/08/2018   Procedure: COLONOSCOPY WITH PROPOFOL;  Surgeon: Toledo, Boykin Nearing, MD;  Location: [facility] ENDOSCOPY;  Service: Gastroenterology;  Laterality: N/A;   CORONARY ANGIOPLASTY WITH STENT PLACEMENT  1999   CYSTOSCOPY WITH STENT PLACEMENT Right 09/20/2022   Procedure: CYSTOSCOPY WITH STENT PLACEMENT;  Surgeon: [name], MD;  Location: [facility] ORS;  Service: Urology;  Laterality: Right;   CYSTOSCOPY/URETEROSCOPY/HOLMIUM LASER/STENT PLACEMENT Right 10/07/2022   Procedure: CYSTOSCOPY/URETEROSCOPY/HOLMIUM LASER/STENT PLACEMENT;  Surgeon: [name], MD;  Location: [facility] ORS;  Service: Urology;  Laterality: Right;   ESOPHAGOGASTRODUODENOSCOPY (EGD) WITH PROPOFOL N/A 02/08/2018   Procedure: ESOPHAGOGASTRODUODENOSCOPY (EGD) WITH PROPOFOL;  Surgeon: Toledo, Boykin Nearing, MD;  Location: [facility] ENDOSCOPY;  Service: Gastroenterology;  Laterality: N/A;   FEMORAL ARTERY STENT  08/2003   HERNIA REPAIR  2008   LEG AMPUTATION AT HIP Left 02/28/2019   MASTECTOMY     RENAL ARTERY STENT  08/2003   TRANSUMBILICAL AUGMENTATION MAMMAPLASTY     VASCULAR SURGERY     VISCERAL ARTERY INTERVENTION N/A 02/13/2020   Procedure: VISCERAL ARTERY INTERVENTION;  Surgeon: Annice Needy, MD;  Location: [facility] INVASIVE CV LAB;  Service: Cardiovascular;  Laterality: N/A;   Family History  Problem Relation Age of Onset   Hyperlipidemia Mother    Congestive Heart Failure Mother    COPD Mother    Heart disease Mother    Hypertension Mother    Osteoporosis Mother    Pancreatic cancer Father    Arthritis Sister    Alzheimer's disease Sister    Hypertension Sister    Prostate cancer Brother    Heart attack Brother    Stomach cancer Brother    Kidney failure Brother    Leukemia Brother    Emphysema Brother    Pancreatic cancer Brother    Stomach cancer Brother    Breast cancer Paternal Grandmother    Leukemia Paternal Grandfather    Heart attack Maternal Uncle    Stroke Maternal Aunt    Social History   Socioeconomic  History   Marital status: Widowed    Spouse name: Orvilla Fus   Number of children: 1   Years of education: Not on file   Highest education level: 12th grade  Occupational History   Occupation: Producer, television/film/video    Comment: retired  Tobacco Use   Smoking status: Former    Current packs/day: 0.00    Types: Cigarettes    Quit date: 05/30/1994    Years since quitting: 28.5   Smokeless tobacco: Never  Vaping Use   Vaping status: Never Used  Substance and Sexual Activity   Alcohol use: Not Currently    Comment: rare- 1 glass of wine EOW   Drug use: No   Sexual activity: Not Currently    Birth control/protection: None  Other Topics Concern   Not on file  Social History Narrative   Lives alone; gets assistance 3x/week-hired help   Social Determinants of Health   Financial Resource Strain: Medium Risk (12/27/2022)   Overall Financial Resource Strain (CARDIA)    Difficulty of Paying Living Expenses: Somewhat hard  Food Insecurity: No Food Insecurity (12/27/2022)   Hunger Vital Sign    Worried About Running Out of Food in the Last Year: Never true    Ran Out of Food in the Last Year: Never true  Transportation Needs: No Transportation Needs (12/27/2022)   PRAPARE - Administrator, Civil Service (Medical): No    Lack of Transportation (Non-Medical): No  Physical Activity: Inactive (12/27/2022)   Exercise Vital Sign    Days of Exercise per Week: 0 days    Minutes of Exercise per Session: 0 min  Stress: Stress Concern Present (12/27/2022)   [employer] of Occupational Health - Occupational Stress Questionnaire    Feeling of Stress : To some extent  Social Connections: Socially Isolated (12/27/2022)   Social Connection and Isolation Panel [NHANES]    Frequency of Communication with Friends and Family: Three times a week    Frequency of Social Gatherings with Friends and Family: Once a week    Attends Religious Services: Never    Database administrator or Organizations: No     Attends Banker Meetings: Never    Marital Status: Widowed    Tobacco Counseling Counseling given: Not Answered   Clinical Intake:  Pre-visit preparation completed: Yes  Pain : No/denies pain     BMI -  recorded: 22.5 Nutritional Status: BMI of 19-24  Normal Nutritional Risks: None Diabetes: No  How often do you need to have someone help you when you read instructions, pamphlets, or other written materials from your doctor or pharmacy?: 1 - Never  Interpreter Needed?: No  Comments: lives alone Information entered by :: B.Disa Riedlinger,LPN   Activities of Daily Living    12/27/2022    8:43 AM 09/30/2022   12:45 PM  In your present state of health, do you have any difficulty performing the following activities:  Hearing? 0   Vision? 0   Difficulty concentrating or making decisions? 1   Walking or climbing stairs? 1   Dressing or bathing? 1   Doing errands, shopping? 1 0  Preparing Food and eating ? N   Using the Toilet? N   In the past six months, have you accidently leaked urine? N   Do you have problems with loss of bowel control? Y   Comment bouts with colitits   Managing your Medications? N   Managing your Finances? N   Housekeeping or managing your Housekeeping? Y     Patient Care Team: Erasmo Downer, MD as PCP - General (Family Medicine) Gilda Crease, Latina Craver, MD as Consulting Physician (Vascular Surgery) Lamar Blinks, MD as Consulting Physician (Cardiology) Sherlon Handing, MD as Consulting Physician (Internal Medicine) Poggi, Excell Seltzer, MD as Consulting Physician (Surgery) Wynona Canes as Physician Assistant (Gastroenterology) Myriam Forehand, MD as Referring Physician (Hematology and Oncology) Josefine Class, MD as Referring Physician (Orthopedic Surgery) Dasher, Cliffton Asters, MD (Dermatology) Kemper Durie, RN as [facility] Care Management  Indicate any recent Medical Services you may have received from other than  [facility] providers in the past year (date may be approximate).     Assessment:   This is a routine wellness examination for Berkli.  Hearing/Vision screen Hearing Screening - Comments:: Adequate hearing- Vision Screening - Comments:: Adequate vision w/glasses [facility]  Dietary issues and exercise activities discussed:     Goals Addressed             This Visit's Progress    DIET - EAT MORE FRUITS AND VEGETABLES   On track    DIET - INCREASE WATER INTAKE   On track    Recommend increasing water intake to 4 glasses of water a day.      Effective management of chronic conditions   On track    Care Coordination Interventions: Evaluation of current treatment plan related to HTN, previous cancer patient, CAD  and patient's adherence to plan as established by provider Reviewed medications with patient and discussed affordability and adherence Reviewed scheduled/upcoming provider appointments including will have PCP visit in Oct Discussed plans with patient for ongoing care management follow up and provided patient with direct contact information for care management team      LIFESTYLE - DECREASE FALLS RISK   On track    Recommend to remove any items from the home that may cause slips or trips.       Depression Screen    12/27/2022    8:39 AM 12/22/2021    9:08 AM 08/21/2021   10:02 AM 05/04/2021   10:57 AM 04/17/2021   10:56 AM 10/24/2020   10:17 AM 04/28/2020   10:50 AM  PHQ 2/9 Scores  PHQ - 2 Score 2 2 2 1 3 3 1   PHQ- 9 Score 4 4 4 3 4 9 2     Fall Risk  12/27/2022    8:26 AM 07/23/2022    9:46 AM 12/22/2021    9:11 AM 08/21/2021   10:03 AM 04/17/2021   10:55 AM  Fall Risk   Falls in the past year? 0 0 0 0 0  Number falls in past yr: 0 0 0 0 0  Injury with Fall? 0 0 0 0 0  Risk for fall due to : No Fall Risks  No Fall Risks No Fall Risks   Follow up Education provided;Falls prevention discussed  Falls evaluation completed Falls evaluation completed     MEDICARE RISK  AT HOME:  Medicare Risk at Home - 12/27/22 0829     Any stairs in or around the home? Yes   has ramp   If so, are there any without handrails? Yes    Home free of loose throw rugs in walkways, pet beds, electrical cords, etc? Yes    Adequate lighting in your home to reduce risk of falls? Yes    Life alert? No   apple watch   Use of a cane, walker or w/c? Yes    Grab bars in the bathroom? Yes    Shower chair or bench in shower? Yes    Elevated toilet seat or a handicapped toilet? Yes             TIMED UP AND GO:  Was the test performed?  No    Cognitive Function:    05/09/2019    2:33 PM  MMSE - Mini Mental State Exam  Orientation to time 3  Orientation to Place 5  Registration 3  Attention/ Calculation 3  Recall 2  Language- name 2 objects 2  Language- repeat 1  Language- follow 3 step command 3  Language- read & follow direction 1  Write a sentence 1  Copy design 0  Total score 24        12/27/2022    8:45 AM 12/22/2021    9:13 AM 10/24/2020   10:24 AM  6CIT Screen  What Year? 0 points 0 points 0 points  What month? 0 points 0 points 0 points  What time? 0 points 0 points 0 points  Count back from 20 0 points 0 points 0 points  Months in reverse 0 points 0 points 0 points  Repeat phrase 0 points 2 points 2 points  Total Score 0 points 2 points 2 points    Immunizations Immunization History  Administered Date(s) Administered   Fluad Quad(high Dose 65+) 02/14/2020, 04/17/2021   Influenza Split 04/19/2011   Influenza, High Dose Seasonal PF 02/18/2014, 03/12/2016, 03/07/2019   Influenza,inj,Quad PF,6+ Mos 02/10/2013, 03/07/2017, 03/24/2018   Influenza-Unspecified 02/10/2013, 03/01/2015, 03/31/2022   PFIZER Comirnaty(Gray Top)Covid-19 Tri-Sucrose Vaccine 06/25/2019, 07/16/2019, 04/04/2020   Pfizer Covid-19 Vaccine Bivalent Booster 41yrs & up 11/10/2020, 04/30/2021   Pneumococcal Conjugate-13 05/18/2013   Pneumococcal Polysaccharide-23 03/02/2004, 08/15/2017    Td 02/08/2005, 11/04/2017   Zoster, Live 01/29/2006    TDAP status: Up to date  Flu Vaccine status: Up to date  Pneumococcal vaccine status: Up to date  Covid-19 vaccine status: Completed vaccines  Qualifies for Shingles Vaccine? Yes   Zostavax completed No   Shingrix Completed?: No.    Education has been provided regarding the importance of this vaccine. Patient has been advised to call insurance company to determine out of pocket expense if they have not yet received this vaccine. Advised may also receive vaccine at local pharmacy or Health Dept. Verbalized acceptance and understanding.  Screening  Tests Health Maintenance  Topic Date Due   Zoster Vaccines- Shingrix (1 of 2) 12/24/1959   COVID-19 Vaccine (6 - 2023-24 season) 01/29/2022   INFLUENZA VACCINE  12/30/2022   Medicare Annual Wellness (AWV)  12/27/2023   DTaP/Tdap/Td (3 - Tdap) 11/05/2027   Pneumonia Vaccine 4+ Years old  Completed   DEXA SCAN  Completed   HPV VACCINES  Aged Out    Health Maintenance  Health Maintenance Due  Topic Date Due   Zoster Vaccines- Shingrix (1 of 2) 12/24/1959   COVID-19 Vaccine (6 - 2023-24 season) 01/29/2022    Colorectal cancer screening: No longer required.   Mammogram status: No longer required due to age.  Bone Density status: Completed yes. Results reflect: Bone density results: OSTEOPOROSIS. Repeat every 3 years.  Lung Cancer Screening: (Low Dose CT Chest recommended if Age 34-80 years, 20 pack-year currently smoking OR have quit w/in 15years.) does not qualify.   Lung Cancer Screening Referral: no  Additional Screening:  Hepatitis C Screening: does not qualify; Completed yes  Vision Screening: Recommended annual ophthalmology exams for early detection of glaucoma and other disorders of the eye. Is the patient up to date with their annual eye exam?  No  Who is the provider or what is the name of the office in which the patient attends annual eye exams? [facility] If  pt is not established with a provider, would they like to be referred to a provider to establish care? No .   Dental Screening: Recommended annual dental exams for proper oral hygiene  Diabetic Foot Exam: n/a  Community Resource Referral / Chronic Care Management: CRR required this visit?  No   CCM required this visit?  No     Plan:     I have personally reviewed and noted the following in the patient's chart:   Medical and social history Use of alcohol, tobacco or illicit drugs  Current medications and supplements including opioid prescriptions. Patient is not currently taking opioid prescriptions. Functional ability and status Nutritional status Physical activity Advanced directives List of other physicians Hospitalizations, surgeries, and ER visits in previous 12 months Vitals Screenings to include cognitive, depression, and falls Referrals and appointments  In addition, I have reviewed and discussed with patient certain preventive protocols, quality metrics, and best practice recommendations. A written personalized care plan for preventive services as well as general preventive health recommendations were provided to patient.     Sue Lush, LPN   1/61/0960   After Visit Summary: (MyChart) Due to this being a telephonic visit, the after visit summary with patients personalized plan was offered to patient via MyChart   Nurse Notes: Pt is w/c dependant as she AKA. She relays she is doing alright. She continues to have a helper 3 days weekly who helps with errands and managing household. Pt inquires about lab results (Iron) and would like PCP to contact her regarding infusions.

## 2022-12-27 NOTE — Patient Instructions (Signed)
[name] , Thank you for taking time to come for your Medicare Wellness Visit. I appreciate your ongoing commitment to your health goals. Please review the following plan we discussed and let me know if I can assist you in the future.   Referrals/Orders/Follow-Ups/Clinician Recommendations: no  This is a list of the screening recommended for you and due dates:  Health Maintenance  Topic Date Due   Zoster (Shingles) Vaccine (1 of 2) 12/24/1959   COVID-19 Vaccine (6 - 2023-24 season) 01/29/2022   Flu Shot  12/30/2022   Medicare Annual Wellness Visit  12/27/2023   DTaP/Tdap/Td vaccine (3 - Tdap) 11/05/2027   Pneumonia Vaccine  Completed   DEXA scan (bone density measurement)  Completed   HPV Vaccine  Aged Out    Advanced directives: (Copy Requested) Please bring a copy of your health care power of attorney and living will to the office to be added to your chart at your convenience.  Next Medicare Annual Wellness Visit scheduled for next year: Yes  Preventive Care 37 Years and Older, Female Preventive care refers to lifestyle choices and visits with your health care provider that can promote health and wellness. What does preventive care include? A yearly physical exam. This is also called an annual well check. Dental exams once or twice a year. Routine eye exams. Ask your health care provider how often you should have your eyes checked. Personal lifestyle choices, including: Daily care of your teeth and gums. Regular physical activity. Eating a healthy diet. Avoiding tobacco and drug use. Limiting alcohol use. Practicing safe sex. Taking low-dose aspirin every day. Taking vitamin and mineral supplements as recommended by your health care provider. What happens during an annual well check? The services and screenings done by your health care provider during your annual well check will depend on your age, overall health, lifestyle risk factors, and family history of  disease. Counseling  Your health care provider may ask you questions about your: Alcohol use. Tobacco use. Drug use. Emotional well-being. Home and relationship well-being. Sexual activity. Eating habits. History of falls. Memory and ability to understand (cognition). Work and work Astronomer. Reproductive health. Screening  You may have the following tests or measurements: Height, weight, and BMI. Blood pressure. Lipid and cholesterol levels. These may be checked every 5 years, or more frequently if you are over 8 years old. Skin check. Lung cancer screening. You may have this screening every year starting at age 82 if you have a 30-pack-year history of smoking and currently smoke or have quit within the past 15 years. Fecal occult blood test (FOBT) of the stool. You may have this test every year starting at age 82. Flexible sigmoidoscopy or colonoscopy. You may have a sigmoidoscopy every 5 years or a colonoscopy every 10 years starting at age 82. Hepatitis C blood test. Hepatitis B blood test. Sexually transmitted disease (STD) testing. Diabetes screening. This is done by checking your blood sugar (glucose) after you have not eaten for a while (fasting). You may have this done every 1-3 years. Bone density scan. This is done to screen for osteoporosis. You may have this done starting at age 82. Mammogram. This may be done every 1-2 years. Talk to your health care provider about how often you should have regular mammograms. Talk with your health care provider about your test results, treatment options, and if necessary, the need for more tests. Vaccines  Your health care provider may recommend certain vaccines, such as: Influenza vaccine. This is  recommended every year. Tetanus, diphtheria, and acellular pertussis (Tdap, Td) vaccine. You may need a Td booster every 10 years. Zoster vaccine. You may need this after age 82. Pneumococcal 13-valent conjugate (PCV13) vaccine. One  dose is recommended after age 82. Pneumococcal polysaccharide (PPSV23) vaccine. One dose is recommended after age 82. Talk to your health care provider about which screenings and vaccines you need and how often you need them. This information is not intended to replace advice given to you by your health care provider. Make sure you discuss any questions you have with your health care provider. Document Released: 06/13/2015 Document Revised: 02/04/2016 Document Reviewed: 03/18/2015 Elsevier Interactive Patient Education  2017 [employer].  Fall Prevention in the Home Falls can cause injuries. They can happen to people of all ages. There are many things you can do to make your home safe and to help prevent falls. What can I do on the outside of my home? Regularly fix the edges of walkways and driveways and fix any cracks. Remove anything that might make you trip as you walk through a door, such as a raised step or threshold. Trim any bushes or trees on the path to your home. Use bright outdoor lighting. Clear any walking paths of anything that might make someone trip, such as rocks or tools. Regularly check to see if handrails are loose or broken. Make sure that both sides of any steps have handrails. Any raised decks and porches should have guardrails on the edges. Have any leaves, snow, or ice cleared regularly. Use sand or salt on walking paths during winter. Clean up any spills in your garage right away. This includes oil or grease spills. What can I do in the bathroom? Use night lights. Install grab bars by the toilet and in the tub and shower. Do not use towel bars as grab bars. Use non-skid mats or decals in the tub or shower. If you need to sit down in the shower, use a plastic, non-slip stool. Keep the floor dry. Clean up any water that spills on the floor as soon as it happens. Remove soap buildup in the tub or shower regularly. Attach bath mats securely with double-sided  non-slip rug tape. Do not have throw rugs and other things on the floor that can make you trip. What can I do in the bedroom? Use night lights. Make sure that you have a light by your bed that is easy to reach. Do not use any sheets or blankets that are too big for your bed. They should not hang down onto the floor. Have a firm chair that has side arms. You can use this for support while you get dressed. Do not have throw rugs and other things on the floor that can make you trip. What can I do in the kitchen? Clean up any spills right away. Avoid walking on wet floors. Keep items that you use a lot in easy-to-reach places. If you need to reach something above you, use a strong step stool that has a grab bar. Keep electrical cords out of the way. Do not use floor polish or wax that makes floors slippery. If you must use wax, use non-skid floor wax. Do not have throw rugs and other things on the floor that can make you trip. What can I do with my stairs? Do not leave any items on the stairs. Make sure that there are handrails on both sides of the stairs and use them. Fix handrails that are  broken or loose. Make sure that handrails are as long as the stairways. Check any carpeting to make sure that it is firmly attached to the stairs. Fix any carpet that is loose or worn. Avoid having throw rugs at the top or bottom of the stairs. If you do have throw rugs, attach them to the floor with carpet tape. Make sure that you have a light switch at the top of the stairs and the bottom of the stairs. If you do not have them, ask someone to add them for you. What else can I do to help prevent falls? Wear shoes that: Do not have high heels. Have rubber bottoms. Are comfortable and fit you well. Are closed at the toe. Do not wear sandals. If you use a stepladder: Make sure that it is fully opened. Do not climb a closed stepladder. Make sure that both sides of the stepladder are locked into place. Ask  someone to hold it for you, if possible. Clearly mark and make sure that you can see: Any grab bars or handrails. First and last steps. Where the edge of each step is. Use tools that help you move around (mobility aids) if they are needed. These include: Canes. Walkers. Scooters. Crutches. Turn on the lights when you go into a dark area. Replace any light bulbs as soon as they burn out. Set up your furniture so you have a clear path. Avoid moving your furniture around. If any of your floors are uneven, fix them. If there are any pets around you, be aware of where they are. Review your medicines with your doctor. Some medicines can make you feel dizzy. This can increase your chance of falling. Ask your doctor what other things that you can do to help prevent falls. This information is not intended to replace advice given to you by your health care provider. Make sure you discuss any questions you have with your health care provider. Document Released: 03/13/2009 Document Revised: 10/23/2015 Document Reviewed: 06/21/2014 Elsevier Interactive Patient Education  2017 [employer].

## 2022-12-31 DIAGNOSIS — M7581 Other shoulder lesions, right shoulder: Secondary | ICD-10-CM | POA: Diagnosis not present

## 2022-12-31 DIAGNOSIS — M75111 Incomplete rotator cuff tear or rupture of right shoulder, not specified as traumatic: Secondary | ICD-10-CM | POA: Diagnosis not present

## 2023-01-07 ENCOUNTER — Other Ambulatory Visit: Payer: Self-pay | Admitting: Family Medicine

## 2023-01-07 NOTE — Telephone Encounter (Signed)
Requested medication (s) are due for refill today - unsure  Requested medication (s) are on the active medication list -yes  Future visit scheduled -yes  Last refill: listed as historical on medication list   Notes to clinic: off protocol- provider review   Requested Prescriptions  Pending Prescriptions Disp Refills   Bacillus Coagulans-Inulin (PROBIOTIC-PREBIOTIC) 1-250 BILLION-MG CAPS [Pharmacy Med Name: PROBIOTIC-PREBIOTIC 1-250 BILLION-M] 90 capsule 1    Sig: TAKE 1 CAPSULE BY MOUTH DAILY     Off-Protocol Failed - 01/07/2023  9:17 AM      Failed - Medication not assigned to a protocol, review manually.      Passed - Valid encounter within last 12 months    Recent Outpatient Visits           1 month ago Status post above-knee amputation of left lower extremity [facility])   [place] [facility] [place], Marzella Schlein, MD   3 months ago Right ureteral stone   [place] [facility] [place], Marzella Schlein, MD   5 months ago Encounter for annual physical exam   [facility] [place] [place], Marzella Schlein, MD   1 year ago Leg swelling   [place] [facility] [place], [place], [name]   1 year ago Leg swelling   [place] [facility] [place], [place], [name]       Future Appointments             In 1 month [name], Marzella Schlein, MD [facility]   In 10 months Carman Ching, [name] [facility] [place]               Requested Prescriptions  Pending Prescriptions Disp Refills   Bacillus Coagulans-Inulin (PROBIOTIC-PREBIOTIC) 1-250 BILLION-MG CAPS [Pharmacy Med Name: PROBIOTIC-PREBIOTIC 1-250 BILLION-M] 90 capsule 1    Sig: TAKE 1 CAPSULE BY MOUTH DAILY     Off-Protocol Failed - 01/07/2023  9:17 AM      Failed - Medication not assigned to a protocol, review manually.      Passed - Valid encounter within last 12 months    Recent Outpatient Visits            1 month ago Status post above-knee amputation of left lower extremity [facility])   [place] [facility] [place], Marzella Schlein, MD   3 months ago Right ureteral stone   [place] [facility] [place], Marzella Schlein, MD   5 months ago Encounter for annual physical exam   [facility] [place], Marzella Schlein, MD   1 year ago Leg swelling   [place] [facility] [place], [place], [name]   1 year ago Leg swelling   [place] [facility] [place], [place], [name]       Future Appointments             In 1 month [name], Marzella Schlein, MD [facility]   In 10 months Carman Ching, [place] [facility] [place]

## 2023-01-27 ENCOUNTER — Telehealth: Payer: Self-pay | Admitting: Family Medicine

## 2023-01-27 NOTE — Telephone Encounter (Signed)
Patient advised. Appt 01/28/23.

## 2023-01-27 NOTE — Telephone Encounter (Signed)
Possible UTIs need evaluation and testing. Recommend appt

## 2023-01-27 NOTE — Telephone Encounter (Signed)
Pt is calling in because she believes she has a UTI and wanted to know if she could be called in something. Pt doesn't want to come in if not necessary. Please follow up with pt.

## 2023-01-28 ENCOUNTER — Encounter: Payer: Self-pay | Admitting: Family Medicine

## 2023-01-28 ENCOUNTER — Ambulatory Visit (INDEPENDENT_AMBULATORY_CARE_PROVIDER_SITE_OTHER): Payer: Medicare HMO | Admitting: Family Medicine

## 2023-01-28 VITALS — BP 141/84 | HR 83 | Ht 60.0 in | Wt 123.0 lb

## 2023-01-28 DIAGNOSIS — Z23 Encounter for immunization: Secondary | ICD-10-CM | POA: Diagnosis not present

## 2023-01-28 DIAGNOSIS — N3090 Cystitis, unspecified without hematuria: Secondary | ICD-10-CM

## 2023-01-28 LAB — POCT URINALYSIS DIPSTICK
Bilirubin, UA: NEGATIVE
Blood, UA: NEGATIVE
Glucose, UA: NEGATIVE
Ketones, UA: NEGATIVE
Nitrite, UA: POSITIVE
Protein, UA: NEGATIVE
Spec Grav, UA: 1.02 (ref 1.010–1.025)
Urobilinogen, UA: 0.2 E.U./dL
pH, UA: 6 (ref 5.0–8.0)

## 2023-01-28 MED ORDER — NITROFURANTOIN MONOHYD MACRO 100 MG PO CAPS: 100.0000 mg | ORAL_CAPSULE | Freq: Two times a day (BID) | ORAL | 0 refills | Status: AC

## 2023-01-28 NOTE — Progress Notes (Signed)
Acute Office Visit  Subjective:     Patient ID: Misty Page, female    DOB: [birth year], 82 y.o.   MRN: [id number]  Chief Complaint  Patient presents with   Urinary Tract Infection    Burning sensation when urinating,     HPI Discussed the use of AI scribe software for clinical note transcription with the patient, who gave verbal consent to proceed.  History of Present Illness   The patient, with a history of recurrent UTIs, presents with a week-long history of urinary symptoms. She describes a stinging sensation during urination, increased urinary frequency, and a heavy feeling over the bladder. The urine appears cloudy. The patient denies any abdominal pain over the bladder area. She has a history of allergies to certain antibiotics, which has previously complicated treatment. The patient also has a history of colitis, which is currently causing discomfort in the lower abdomen.       ROS per HPI      Objective:    BP (!) 141/84 (BP Location: Right Arm, Patient Position: Sitting, Cuff Size: Normal)   Pulse 83   Ht 5' (1.524 m)   Wt 123 lb (55.8 kg)   SpO2 97%   BMI 24.02 kg/m    Physical Exam Vitals reviewed.  Constitutional:      General: She is not in acute distress.    Appearance: She is well-developed.  HENT:     Head: Normocephalic and atraumatic.  Eyes:     General: No scleral icterus.    Conjunctiva/sclera: Conjunctivae normal.  Cardiovascular:     Rate and Rhythm: Normal rate and regular rhythm.     Heart sounds: Murmur heard.  Pulmonary:     Effort: Pulmonary effort is normal. No respiratory distress.     Breath sounds: Normal breath sounds. No wheezing or rales.  Abdominal:     General: There is no distension.     Palpations: Abdomen is soft.     Tenderness: There is abdominal tenderness in the left lower quadrant. There is no guarding or rebound.  Skin:    General: Skin is warm and dry.     Capillary Refill: Capillary refill takes less than 2  seconds.     Findings: No rash.  Neurological:     Mental Status: She is alert and oriented to person, place, and time.  Psychiatric:        Behavior: Behavior normal.     Results for orders placed or performed in visit on 01/28/23  POCT Urinalysis Dipstick  Result Value Ref Range   Color, UA     Clarity, UA     Glucose, UA Negative Negative   Bilirubin, UA negative    Ketones, UA negative    Spec Grav, UA 1.020 1.010 - 1.025   Blood, UA negative    pH, UA 6.0 5.0 - 8.0   Protein, UA Negative Negative   Urobilinogen, UA 0.2 0.2 or 1.0 E.U./dL   Nitrite, UA positive    Leukocytes, UA Moderate (2+) (A) Negative   Appearance     Odor          Assessment & Plan:   Problem List Items Addressed This Visit   None Visit Diagnoses     Cystitis    -  Primary   Relevant Orders   POCT Urinalysis Dipstick (Completed)   Urine Culture   Immunization due       Relevant Orders   Flu Vaccine Trivalent High Dose (Fluad) (  Completed)   Influenza vaccine needed       Relevant Orders   Flu Vaccine Trivalent High Dose (Fluad) (Completed)          Urinary Tract Infection (UTI) Symptoms of dysuria, frequency, and cloudy urine for one week. Urinalysis positive for leukocytes and nitrites. Discussed antibiotic options considering patient's allergies and previous urine culture results. -Start Macrobid, previously tolerated by the patient. -Send urine for culture and sensitivity. -Adjust antibiotic regimen based on culture results when available.  Recurrent UTIs Noted recurrent UTIs over the past few years. Discussed potential for prophylactic antibiotics if frequency of UTIs continues. -Monitor for recurrent UTIs and consider prophylactic antibiotics if necessary.  General Health Maintenance Annual Medicare visit scheduled for October. -Continue with planned annual Medicare visit in October.        Meds ordered this encounter  Medications   nitrofurantoin,  macrocrystal-monohydrate, (MACROBID) 100 MG capsule    Sig: Take 1 capsule (100 mg total) by mouth 2 (two) times daily for 5 days.    Dispense:  10 capsule    Refill:  0    Return if symptoms worsen or fail to improve.  Shirlee Latch, MD

## 2023-02-01 LAB — URINE CULTURE

## 2023-02-02 ENCOUNTER — Telehealth: Payer: Self-pay

## 2023-02-02 MED ORDER — SULFAMETHOXAZOLE-TRIMETHOPRIM 800-160 MG PO TABS: 1.0000 | ORAL_TABLET | Freq: Two times a day (BID) | ORAL | 0 refills | Status: AC

## 2023-02-02 NOTE — Telephone Encounter (Signed)
-----   Message from Shirlee Latch sent at 02/01/2023  4:54 PM EDT ----- Urine culture confirms UTI that is intermediately sensitive to abx prescribed.  Hope she is doing better. If not, then we should stop the macrobid and switch to Bactrim DS 1 tab BID x5d #10 r0 (ok to eRx if patient not improving).

## 2023-02-06 ENCOUNTER — Other Ambulatory Visit: Payer: Self-pay | Admitting: Family Medicine

## 2023-02-07 ENCOUNTER — Other Ambulatory Visit: Payer: Self-pay | Admitting: Family Medicine

## 2023-03-03 DIAGNOSIS — Z89622 Acquired absence of left hip joint: Secondary | ICD-10-CM | POA: Diagnosis not present

## 2023-03-03 DIAGNOSIS — Z7409 Other reduced mobility: Secondary | ICD-10-CM | POA: Diagnosis not present

## 2023-03-04 ENCOUNTER — Encounter: Payer: Self-pay | Admitting: Family Medicine

## 2023-03-04 ENCOUNTER — Ambulatory Visit (INDEPENDENT_AMBULATORY_CARE_PROVIDER_SITE_OTHER): Payer: Medicare HMO | Admitting: Family Medicine

## 2023-03-04 VITALS — BP 138/78 | HR 82

## 2023-03-04 DIAGNOSIS — E7800 Pure hypercholesterolemia, unspecified: Secondary | ICD-10-CM

## 2023-03-04 DIAGNOSIS — I10 Essential (primary) hypertension: Secondary | ICD-10-CM

## 2023-03-04 DIAGNOSIS — D509 Iron deficiency anemia, unspecified: Secondary | ICD-10-CM | POA: Diagnosis not present

## 2023-03-04 DIAGNOSIS — D692 Other nonthrombocytopenic purpura: Secondary | ICD-10-CM

## 2023-03-04 DIAGNOSIS — Z89612 Acquired absence of left leg above knee: Secondary | ICD-10-CM | POA: Diagnosis not present

## 2023-03-04 DIAGNOSIS — G546 Phantom limb syndrome with pain: Secondary | ICD-10-CM | POA: Diagnosis not present

## 2023-03-04 MED ORDER — GABAPENTIN 300 MG PO CAPS: 300.0000 mg | ORAL_CAPSULE | Freq: Two times a day (BID) | ORAL | 1 refills | Status: DC

## 2023-03-04 NOTE — Assessment & Plan Note (Signed)
chronic  Continue PO iron Recheck CBC and iron panel Consider Heme referral for IV iron consideration

## 2023-03-04 NOTE — Progress Notes (Signed)
Established Patient Office Visit  Subjective   Patient ID: Misty Page, female    DOB: [birth year]  Age: 82 y.o. MRN: [id number]  Chief Complaint  Patient presents with   Follow-up    3 mo f/u questions about gabapentin meds    HPI  Discussed the use of AI scribe software for clinical note transcription with the patient, who gave verbal consent to proceed.  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 The patient is currently on amlodipine 5mg  daily, aspirin 81mg  daily, Plavix 75mg  daily, Zetia 10mg  daily, gabapentin 300mg  TID as needed for phantom limb pain, losartan 100mg  daily, methimazole 5mg  daily for hyperthyroidism, rosuvastatin 20mg  daily, and dugastatin 10mg  daily. The patient also takes an iron supplement, but iron levels have not yet normalized.         ROS per HPI    Objective:     BP 138/78 (BP Location: Right Arm, Patient Position: Sitting, Cuff Size: Large)   Pulse 82   SpO2 96%    Physical Exam Vitals reviewed.  Constitutional:      General: She is not in acute distress.    Appearance: Normal appearance. She is well-developed. She is not diaphoretic.  HENT:     Head: Normocephalic and atraumatic.  Eyes:     General: No scleral icterus.    Conjunctiva/sclera: Conjunctivae normal.  Neck:     Thyroid: No thyromegaly.  Cardiovascular:     Rate and Rhythm: Normal rate and regular rhythm.     Heart sounds: Murmur heard.  Pulmonary:     Effort: Pulmonary effort is normal. No respiratory distress.     Breath sounds: Normal breath sounds. No wheezing, rhonchi or rales.  Musculoskeletal:     Cervical back: Neck supple.     Right lower leg: No edema.     Comments: LLE s/p AKA  Lymphadenopathy:     Cervical: No  cervical adenopathy.  Skin:    General: Skin is warm and dry.  Neurological:     Mental Status: She is alert and oriented to person, place, and time. Mental status is at baseline.  Psychiatric:        Mood and Affect: Mood normal.        Behavior: Behavior normal.      No results found for any visits on 03/04/23.    The ASCVD Risk score (Arnett DK, et al., 2019) failed to calculate for the following reasons:   The 2019 ASCVD risk score is only valid for ages 70 to 53   The patient has a prior MI or stroke diagnosis    Assessment & Plan:   Problem List Items Addressed This Visit       Cardiovascular and Mediastinum   Essential (primary) hypertension - Primary    Well controlled Continue current medications Recheck metabolic panel F/u in 6 months       Relevant Orders   Comprehensive metabolic panel   Senile purpura (HCC)    Chronic and stable DAPT likely contributes        Nervous and Auditory   Phantom limb pain (HCC)    Not having much phantom limb pain, but does notice she is better controlled with regular use of gabapentin as opposed to prn Will switch gabapentin to 300mg  BID  Relevant Medications   gabapentin (NEURONTIN) 300 MG capsule     Other   Hypercholesteremia    Previously well controlled Continue statin and zetia Repeat FLP and CMP      Relevant Orders   Lipid panel   Comprehensive metabolic panel   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)    Gabapentin as above      Iron deficiency anemia    chronic  Continue PO iron Recheck CBC and iron panel Consider Heme referral for IV iron consideration      Relevant Orders   CBC w/Diff/Platelet   Iron, TIBC and Ferritin Panel   Ambulatory referral to Hematology / Oncology        Hypertension and Vascular Disease Significant vascular disease including bilateral carotid artery disease, mesenteric ischemia, and peripheral vascular disease. -Continue current  regimen of amlodipine 5mg  daily, aspirin 81mg  daily, Plavix 75mg  daily, and Losartan 100mg  daily.  Hyperlipidemia Managed with Zetia 10mg  daily and Rosuvastatin 20mg  daily. -Continue current regimen.  Hyperthyroidism Managed with methimazole 5mg  daily and followed by endocrinology. -Continue current regimen.  Iron Deficiency Anemia Improving but not yet normalized with oral iron supplementation. -Consider referral to hematology for possible IV iron infusions.        Return in about 6 months (around 09/02/2023) for CPE.    Shirlee Latch, MD

## 2023-03-04 NOTE — Assessment & Plan Note (Signed)
Gabapentin as above

## 2023-03-04 NOTE — Assessment & Plan Note (Signed)
Chronic and stable DAPT likely contributes

## 2023-03-04 NOTE — Assessment & Plan Note (Signed)
Previously well controlled Continue statin and zetia Repeat FLP and CMP

## 2023-03-04 NOTE — Assessment & Plan Note (Signed)
Not having much phantom limb pain, but does notice she is better controlled with regular use of gabapentin as opposed to prn Will switch gabapentin to 300mg  BID

## 2023-03-04 NOTE — Assessment & Plan Note (Signed)
Well controlled Continue current medications Recheck metabolic panel F/u in 6 months  

## 2023-03-05 LAB — COMPREHENSIVE METABOLIC PANEL
ALT: 9 [IU]/L (ref 0–32)
AST: 16 [IU]/L (ref 0–40)
Albumin: 4.1 g/dL (ref 3.7–4.7)
Alkaline Phosphatase: 72 [IU]/L (ref 44–121)
BUN/Creatinine Ratio: 32 — ABNORMAL HIGH (ref 12–28)
BUN: 15 mg/dL (ref 8–27)
Bilirubin Total: 0.2 mg/dL (ref 0.0–1.2)
CO2: 24 mmol/L (ref 20–29)
Calcium: 9.3 mg/dL (ref 8.7–10.3)
Chloride: 103 mmol/L (ref 96–106)
Creatinine, Ser: 0.47 mg/dL — ABNORMAL LOW (ref 0.57–1.00)
Globulin, Total: 2.3 g/dL (ref 1.5–4.5)
Glucose: 112 mg/dL — ABNORMAL HIGH (ref 70–99)
Potassium: 3.9 mmol/L (ref 3.5–5.2)
Sodium: 140 mmol/L (ref 134–144)
Total Protein: 6.4 g/dL (ref 6.0–8.5)
eGFR: 95 mL/min/{1.73_m2} (ref >59)

## 2023-03-05 LAB — CBC WITH DIFFERENTIAL/PLATELET
Basophils Absolute: 0 10*3/uL (ref 0.0–0.2)
Basos: 1 %
EOS (ABSOLUTE): 0.2 10*3/uL (ref 0.0–0.4)
Eos: 3 %
Hematocrit: 42.7 % (ref 34.0–46.6)
Hemoglobin: 12.5 g/dL (ref 11.1–15.9)
Immature Grans (Abs): 0 10*3/uL (ref 0.0–0.1)
Immature Granulocytes: 0 %
Lymphocytes Absolute: 0.9 10*3/uL (ref 0.7–3.1)
Lymphs: 16 %
MCH: 25.3 pg — ABNORMAL LOW (ref 26.6–33.0)
MCHC: 29.3 g/dL — ABNORMAL LOW (ref 31.5–35.7)
MCV: 86 fL (ref 79–97)
Monocytes Absolute: 0.5 10*3/uL (ref 0.1–0.9)
Monocytes: 10 %
Neutrophils Absolute: 3.6 10*3/uL (ref 1.4–7.0)
Neutrophils: 70 %
Platelets: 324 10*3/uL (ref 150–450)
RBC: 4.95 x10E6/uL (ref 3.77–5.28)
RDW: 21.4 % — ABNORMAL HIGH (ref 11.7–15.4)
WBC: 5.2 10*3/uL (ref 3.4–10.8)

## 2023-03-05 LAB — LIPID PANEL
Chol/HDL Ratio: 2.7 {ratio} (ref 0.0–4.4)
Cholesterol, Total: 168 mg/dL (ref 100–199)
HDL: 62 mg/dL (ref >39)
LDL Chol Calc (NIH): 87 mg/dL (ref 0–99)
Triglycerides: 104 mg/dL (ref 0–149)
VLDL Cholesterol Cal: 19 mg/dL (ref 5–40)

## 2023-03-05 LAB — IRON,TIBC AND FERRITIN PANEL
Ferritin: 20 ng/mL (ref 15–150)
Iron Saturation: 16 % (ref 15–55)
Iron: 68 ug/dL (ref 27–139)
Total Iron Binding Capacity: 420 ug/dL (ref 250–450)
UIBC: 352 ug/dL (ref 118–369)

## 2023-03-07 ENCOUNTER — Inpatient Hospital Stay: Payer: Medicare HMO | Attending: Internal Medicine | Admitting: Internal Medicine

## 2023-03-07 ENCOUNTER — Inpatient Hospital Stay: Payer: Medicare HMO

## 2023-03-07 ENCOUNTER — Encounter: Payer: Self-pay | Admitting: Internal Medicine

## 2023-03-07 VITALS — BP 160/78 | HR 96 | Temp 97.6°F | Wt 127.0 lb

## 2023-03-07 DIAGNOSIS — D509 Iron deficiency anemia, unspecified: Secondary | ICD-10-CM | POA: Insufficient documentation

## 2023-03-07 DIAGNOSIS — Z87891 Personal history of nicotine dependence: Secondary | ICD-10-CM | POA: Insufficient documentation

## 2023-03-07 NOTE — Progress Notes (Signed)
[facility]  [phone] [phone]  ID: Misty Page OB: 06/25/1940  MR#: [id number]  HKV#:[id number]  Patient Care Team: Erasmo Downer, MD as PCP - General (Family Medicine) Gilda Crease, Latina Craver, MD as Consulting Physician (Vascular Surgery) Lamar Blinks, MD as Consulting Physician (Cardiology) Sherlon Handing, MD as Consulting Physician (Internal Medicine) Poggi, Excell Seltzer, MD as Consulting Physician (Surgery) Wynona Canes as Physician Assistant (Gastroenterology) Myriam Forehand, MD as Referring Physician (Hematology and Oncology) Josefine Class, MD as Referring Physician (Orthopedic Surgery) Dasher, Cliffton Asters, MD (Dermatology) Kemper Durie, RN as [facility] Care Management Michaelyn Barter, MD as Consulting Physician (Oncology)  REFERRING PROVIDER: Dr. Beryle Flock  REASON FOR REFERRAL: IDA  HPI: Misty Page is a 82 y.o. female with past medical history of iron deficiency anemia, CAD status post stents, bilateral carotid artery stenosis, TIA, hypertension, hyperlipidemia, diverticulitis was referred to hematology for consideration of iron infusions for iron deficiency anemia.  Patient reports history of IDA for several years.  Last had colonoscopy in 2019 with Dr. Norma Fredrickson which showed multiple diverticuli, otherwise unremarkable.  Endoscopy was negative.  Repeat was not advised due to age.  She takes Niferrex 150 mg intermittently.  Labs from July 2024 showed hemoglobin of 8.5 with ferritin of 9.  She resumed her iron pills.  Repeat labs from 03/04/2023 showed hemoglobin improvement to 12.5 ferritin 20.  Denies any bleeding in urine or stools.    REVIEW OF SYSTEMS:   ROS  As per HPI. Otherwise, a complete review of systems is negative.  PAST MEDICAL HISTORY: Past Medical History:  Diagnosis Date   Allergy    Anemia    Arthritis    CA of skin 10/15/2014   Calculus of kidney 10/15/2014   Seen in ER  06/16/10.    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   Coronary artery disease    Diverticulitis    Environmental and seasonal allergies    Fibrocystic breast disease (FCBD)    GERD (gastroesophageal reflux disease)    Heart murmur    High cholesterol    History of heart artery stent    History of kidney stones    Hyperlipidemia    Hypertension    Hyperthyroidism    Myocardial infarction (HCC)    Osteomyelitis of left femur (HCC) 11/30/2018   Osteoporosis    PONV (postoperative nausea and vomiting)    Reactive depression 02/25/2019   Thyroid disease    TIA (transient ischemic attack)     PAST SURGICAL HISTORY: Past Surgical History:  Procedure Laterality Date   ABDOMINAL HYSTERECTOMY  2007   BREAST ENHANCEMENT SURGERY  2004   BREAST IMPLANT REMOVAL     BREAST SURGERY  1984   Fibrocystic Disease   CAROTID ENDARTERECTOMY Left 06/21/2014   Dr. Gilda Crease   CAROTID ENDARTERECTOMY Right 08/02/2014   Dr. Gilda Crease   CAROTID PTA/STENT INTERVENTION N/A 06/15/2017   Procedure: CAROTID PTA/STENT INTERVENTION;  Surgeon: Renford Dills, MD;  Location: [facility] INVASIVE CV LAB;  Service: Cardiovascular;  Laterality: N/A;   COLONOSCOPY WITH PROPOFOL N/A 07/14/2015   Procedure: COLONOSCOPY WITH PROPOFOL;  Surgeon: Wallace Cullens, MD;  Location: [facility] ENDOSCOPY;  Service: Gastroenterology;  Laterality: N/A;   COLONOSCOPY WITH PROPOFOL N/A 02/08/2018   Procedure: COLONOSCOPY WITH PROPOFOL;  Surgeon: Toledo, Boykin Nearing, MD;  Location: [facility] ENDOSCOPY;  Service: Gastroenterology;  Laterality: N/A;   CORONARY  ANGIOPLASTY WITH STENT PLACEMENT  1999   CYSTOSCOPY WITH STENT PLACEMENT Right 09/20/2022   Procedure: CYSTOSCOPY WITH STENT PLACEMENT;  Surgeon: [name], MD;  Location: [facility] ORS;  Service: Urology;  Laterality: Right;   CYSTOSCOPY/URETEROSCOPY/HOLMIUM LASER/STENT PLACEMENT Right 10/07/2022   Procedure:  CYSTOSCOPY/URETEROSCOPY/HOLMIUM LASER/STENT PLACEMENT;  Surgeon: [name], MD;  Location: [facility] ORS;  Service: Urology;  Laterality: Right;   ESOPHAGOGASTRODUODENOSCOPY (EGD) WITH PROPOFOL N/A 02/08/2018   Procedure: ESOPHAGOGASTRODUODENOSCOPY (EGD) WITH PROPOFOL;  Surgeon: Toledo, Boykin Nearing, MD;  Location: [facility] ENDOSCOPY;  Service: Gastroenterology;  Laterality: N/A;   FEMORAL ARTERY STENT  08/2003   HERNIA REPAIR  2008   LEG AMPUTATION AT HIP Left 02/28/2019   MASTECTOMY     RENAL ARTERY STENT  08/2003   TRANSUMBILICAL AUGMENTATION MAMMAPLASTY     VASCULAR SURGERY     VISCERAL ARTERY INTERVENTION N/A 02/13/2020   Procedure: VISCERAL ARTERY INTERVENTION;  Surgeon: Annice Needy, MD;  Location: [facility] INVASIVE CV LAB;  Service: Cardiovascular;  Laterality: N/A;    FAMILY HISTORY: Family History  Problem Relation Age of Onset   Hyperlipidemia Mother    Congestive Heart Failure Mother    COPD Mother    Heart disease Mother    Hypertension Mother    Osteoporosis Mother    Pancreatic cancer Father    Arthritis Sister    Alzheimer's disease Sister    Hypertension Sister    Prostate cancer Brother    Heart attack Brother    Stomach cancer Brother    Kidney failure Brother    Leukemia Brother    Emphysema Brother    Pancreatic cancer Brother    Stomach cancer Brother    Breast cancer Paternal Grandmother    Leukemia Paternal Grandfather    Heart attack Maternal Uncle    Stroke Maternal Aunt     HEALTH MAINTENANCE: Social History   Tobacco Use   Smoking status: Former    Current packs/day: 0.00    Types: Cigarettes    Quit date: 05/30/1994    Years since quitting: 28.7   Smokeless tobacco: Never  Vaping Use   Vaping status: Never Used  Substance Use Topics   Alcohol use: Not Currently    Comment: rare- 1 glass of wine EOW   Drug use: No     Allergies  Allergen Reactions   Amoxicillin Anaphylaxis   Cephalosporins Swelling and Rash    Lip swelling and rash on  CTX and Vancomycin.   Naproxen Hives, Shortness Of Breath, Nausea And Vomiting and Swelling   Penicillins Shortness Of Breath, Swelling and Rash    Tolerated ceftriaxone 08/2022   Codeine Nausea And Vomiting   Levofloxacin Nausea And Vomiting   Shellfish Allergy Hives, Diarrhea, Nausea And Vomiting and Swelling   Vancomycin Swelling and Rash    Occurred while on CTX and Vancomycin. More likely CTX as she has anaphylaxis to PCN, but should monitor closely.    Current Outpatient Medications  Medication Sig Dispense Refill   acetaminophen (TYLENOL) 500 MG tablet Take 500-1,000 mg by mouth every 8 (eight) hours as needed.     amLODipine (NORVASC) 5 MG tablet Take 1 tablet (5 mg total) by mouth daily. 90 tablet 3   Ascorbic Acid (VITAMIN C) 1000 MG tablet TAKE 1 TABLET BY MOUTH EVERY MORNING 90 tablet 1   aspirin EC 81 MG tablet Take 81 mg by mouth daily. Swallow whole.     Bacillus Coagulans-Inulin (PROBIOTIC-PREBIOTIC) 1-250 BILLION-MG CAPS TAKE 1 CAPSULE BY MOUTH DAILY  90 capsule 1   Calcium Carb-Cholecalciferol (CALCIUM+D3) 600-20 MG-MCG TABS Take 1 tablet by mouth daily. 30 tablet 11   clopidogrel (PLAVIX) 75 MG tablet TAKE 1 TABLET BY MOUTH DAILY 90 tablet 1   ezetimibe (ZETIA) 10 MG tablet Take 1 tablet (10 mg total) by mouth daily. 90 tablet 3   famotidine (PEPCID) 20 MG tablet Take 1 tablet (20 mg total) by mouth 2 (two) times daily. 180 tablet 3   fluticasone (FLONASE) 50 MCG/ACT nasal spray USE 2 SPRAYS EACH NOSTRIL DAILY (Patient taking differently: Place 1 spray into both nostrils at bedtime as needed for allergies.) 48 g 3   gabapentin (NEURONTIN) 300 MG capsule Take 1 capsule (300 mg total) by mouth 2 (two) times daily. 180 capsule 1   HYDROcodone-acetaminophen (NORCO/VICODIN) 5-325 MG tablet Take 1-2 tablets by mouth every 6 (six) hours as needed for moderate pain. 10 tablet 0   iron polysaccharides (NIFEREX) 150 MG capsule Take 1 capsule (150 mg total) by mouth daily. 90 capsule 3    losartan (COZAAR) 100 MG tablet TAKE 1 TABLET BY MOUTH DAILY 90 tablet 1   meclizine (ANTIVERT) 25 MG tablet Take 1 tablet (25 mg total) by mouth 3 (three) times daily as needed for dizziness or nausea. 30 tablet 0   Mesalamine 800 MG TBEC Take 800 mg by mouth in the morning, at noon, and at bedtime.     methimazole (TAPAZOLE) 5 MG tablet Take 5 mg by mouth. 1 Tablet on Mondays, Wednesdays and Fridays and take 1/2 tablet daily the rest of the week.     Probiotic Product (PROBIOTIC DAILY PO) Take 1 capsule by mouth daily.     rosuvastatin (CRESTOR) 20 MG tablet TAKE 1/2 TABLET BY MOUTH DAILY 45 tablet 2   Current Facility-Administered Medications  Medication Dose Route Frequency Provider Last Rate Last Admin   sulfamethoxazole-trimethoprim (BACTRIM DS) 800-160 MG per tablet 1 tablet  1 tablet Oral Q12H [name], MD        OBJECTIVE: Vitals:   03/07/23 1142 03/07/23 1146  BP: (!) 177/78 (!) 160/78  Pulse: 96   Temp: 97.6 F (36.4 C)   SpO2: 95%      Body mass index is 24.8 kg/m.      General: Well-developed, well-nourished, no acute distress. Eyes: Pink conjunctiva, anicteric sclera. HEENT: Normocephalic, moist mucous membranes, clear oropharnyx. Lungs: Clear to auscultation bilaterally. Heart: Regular rate and rhythm. No rubs, murmurs, or gallops. Abdomen: Soft, nontender, nondistended. No organomegaly noted, normoactive bowel sounds. Musculoskeletal: No edema, cyanosis, or clubbing. Neuro: Alert, answering all questions appropriately. Cranial nerves grossly intact. Skin: No rashes or petechiae noted. Psych: Normal affect. Lymphatics: No cervical, calvicular, axillary or inguinal LAD.   LAB RESULTS:  Lab Results  Component Value Date   NA 140 03/04/2023   K 3.9 03/04/2023   CL 103 03/04/2023   CO2 24 03/04/2023   GLUCOSE 112 (H) 03/04/2023   BUN 15 03/04/2023   CREATININE 0.47 (L) 03/04/2023   CALCIUM 9.3 03/04/2023   PROT 6.4 03/04/2023   ALBUMIN 4.1  03/04/2023   AST 16 03/04/2023   ALT 9 03/04/2023   ALKPHOS 72 03/04/2023   BILITOT 0.2 03/04/2023   GFRNONAA >60 09/23/2022   GFRAA 107 04/28/2020    Lab Results  Component Value Date   WBC 5.2 03/04/2023   NEUTROABS 3.6 03/04/2023   HGB 12.5 03/04/2023   HCT 42.7 03/04/2023   MCV 86 03/04/2023   PLT 324 03/04/2023  Lab Results  Component Value Date   TIBC 420 03/04/2023   TIBC 479 (H) 12/22/2022   TIBC 479 (H) 07/23/2022   FERRITIN 20 03/04/2023   FERRITIN 9 (L) 12/22/2022   FERRITIN 7 (L) 07/23/2022   IRONPCTSAT 16 03/04/2023   IRONPCTSAT 10 (L) 12/22/2022   IRONPCTSAT 2 (LL) 07/23/2022     STUDIES: No results found.  ASSESSMENT AND PLAN:   Misty Page is a 82 y.o. female with pmh of iron deficiency anemia, CAD status post stents, bilateral carotid artery stenosis, TIA, hypertension, hyperlipidemia, diverticulitis was referred to hematology for consideration of iron infusions for iron deficiency anemia.  # Iron deficiency anemia -Of unknown etiology.  ?  Dietary - Patient reports history of IDA for several years.  Last had colonoscopy in 2019 with Dr. Norma Fredrickson which showed multiple diverticuli, otherwise unremarkable.  Endoscopy was negative.  Repeat was not advised due to age.  She takes Niferrex 150 mg intermittently.  Labs from July 2024 showed hemoglobin of 8.5 with ferritin of 9.  She resumed her iron pills.  Repeat labs from 03/04/2023 showed hemoglobin improvement to 12.5 ferritin 20.  Denies any bleeding in urine or stools.  -I discussed with the patient that she has responded well to the oral iron.  Advised to continue with oral supplements with vitamin C.  There is no indication for iron infusions at this time..  Patient will continue to follow with Dr.[name] for labs.  If her iron levels drop or become anemic despite taking oral supplementation, patient can be referred back to [place] for consideration of iron infusion.  RTC as needed  Patient expressed  understanding and was in agreement with this plan. She also understands that She can call clinic at any time with any questions, concerns, or complaints.   I spent a total of 45 minutes reviewing chart data, face-to-face evaluation with the patient, counseling and coordination of care as detailed above.  Michaelyn Barter, MD   03/07/2023 12:05 PM

## 2023-03-08 ENCOUNTER — Ambulatory Visit: Payer: Self-pay | Admitting: *Deleted

## 2023-03-08 NOTE — Patient Outreach (Signed)
Care Coordination   Follow Up Visit Note   03/08/2023 Name: [name] MRN: [id number] DOB: [birth year]  Misty Page is a 82 y.o. year old female who sees [name], Marzella Schlein, MD for primary care. I spoke with  [name] by phone today.  What matters to the patients health and wellness today?  State she is doing well, pain level has decreased to very manageable at 2/10.  Denies any urgent concerns, encouraged to contact this care manager with questions.      Goals Addressed             This Visit's Progress    COMPLETED: Effective management of chronic conditions   On track    Care Coordination Interventions: Evaluation of current treatment plan related to HTN, previous cancer patient, CAD  and patient's adherence to plan as established by provider Reviewed medications with patient and discussed affordability and adherence Reviewed scheduled/upcoming provider appointments including  PCP visit in Oct done, follow up in 6 months Discussed plans with patient for ongoing care management follow up and provided patient with direct contact information for care management team         SDOH assessments and interventions completed:  No     Care Coordination Interventions:  Yes, provided   Interventions Today    Flowsheet Row Most Recent Value  Chronic Disease   Chronic disease during today's visit Hypertension (HTN), Other  [CAD, neuropathy]  General Interventions   General Interventions Discussed/Reviewed General Interventions Reviewed, Vaccines, Doctor Visits, Durable Medical Equipment (DME)  Vaccines Flu  Doctor Visits Discussed/Reviewed Doctor Visits Reviewed, PCP  [upcoming - ortho 11/1, GI 11/21, Endo 1/22]  Durable Medical Equipment (DME) BP Cuff  PCP/Specialist Visits Compliance with follow-up visit  [Completed with no issues were cancer center yesterday and PCP on 10/4]  Education Interventions   Education Provided Provided Education  Provided Verbal Education  On Medication, When to see the doctor  [reminded to monitor blood pressure, was stable during office visits.  Gabapentin was slightly increased for management of neuropathy, compliant with medication management]       Follow up plan: No further intervention required.   Encounter Outcome:  Patient Visit Completed   Kemper Durie, RN, MSN, [facility] Care Management Care Management Coordinator [phone]

## 2023-03-15 ENCOUNTER — Other Ambulatory Visit: Payer: Self-pay | Admitting: Family Medicine

## 2023-03-15 DIAGNOSIS — I10 Essential (primary) hypertension: Secondary | ICD-10-CM

## 2023-03-16 NOTE — Telephone Encounter (Signed)
Requested Prescriptions  Pending Prescriptions Disp Refills   clopidogrel (PLAVIX) 75 MG tablet [Pharmacy Med Name: CLOPIDOGREL BISULFATE 75 MG TAB] 90 tablet 1    Sig: TAKE 1 TABLET BY MOUTH DAILY     Hematology: Antiplatelets - clopidogrel Failed - 03/15/2023  5:26 PM      Failed - Cr in normal range and within 360 days    Creatinine  Date Value Ref Range Status  06/22/2014 0.65 0.60 - 1.30 mg/dL Final   Creatinine, Ser  Date Value Ref Range Status  03/04/2023 0.47 (L) 0.57 - 1.00 mg/dL Final         Passed - HCT in normal range and within 180 days    Hematocrit  Date Value Ref Range Status  03/04/2023 42.7 34.0 - 46.6 % Final         Passed - HGB in normal range and within 180 days    Hemoglobin  Date Value Ref Range Status  03/04/2023 12.5 11.1 - 15.9 g/dL Final         Passed - PLT in normal range and within 180 days    Platelets  Date Value Ref Range Status  03/04/2023 324 150 - 450 x10E3/uL Final         Passed - Valid encounter within last 6 months    Recent Outpatient Visits           1 week ago Essential (primary) hypertension   [place] [facility] [place], Marzella Schlein, MD   1 month ago Cystitis   [place] [facility] Erasmo Downer, MD   3 months ago Status post above-knee amputation of left lower extremity [facility])   [place] [facility] [place], Marzella Schlein, MD   5 months ago Right ureteral stone   [place] [facility] [place], Marzella Schlein, MD   7 months ago Encounter for annual physical exam   [place] [facility] [place], Marzella Schlein, MD       Future Appointments             In 5 months [name], Marzella Schlein, MD [facility]   In 8 months [place], [place], [place] [facility] [place]             losartan (COZAAR) 100 MG tablet [Pharmacy Med Name: LOSARTAN POTASSIUM 100 MG TAB] 90 tablet 1    Sig: TAKE 1  TABLET BY MOUTH DAILY     Cardiovascular:  Angiotensin Receptor Blockers Failed - 03/15/2023  5:26 PM      Failed - Cr in normal range and within 180 days    Creatinine  Date Value Ref Range Status  06/22/2014 0.65 0.60 - 1.30 mg/dL Final   Creatinine, Ser  Date Value Ref Range Status  03/04/2023 0.47 (L) 0.57 - 1.00 mg/dL Final         Failed - Last BP in normal range    BP Readings from Last 1 Encounters:  03/07/23 (!) 160/78         Passed - K in normal range and within 180 days    Potassium  Date Value Ref Range Status  03/04/2023 3.9 3.5 - 5.2 mmol/L Final  06/22/2014 3.5 3.5 - 5.1 mmol/L Final         Passed - Patient is not pregnant      Passed - Valid encounter within last 6 months    Recent Outpatient Visits  1 week ago Essential (primary) hypertension   [place] [facility] [place], Marzella Schlein, MD   1 month ago Cystitis   [place] [facility] [place], Marzella Schlein, MD   3 months ago Status post above-knee amputation of left lower extremity [facility])   [place] [facility] [place] Flock, Marzella Schlein, MD   5 months ago Right ureteral stone   [place] [facility] [place], Marzella Schlein, MD   7 months ago Encounter for annual physical exam   [facility] [place], Marzella Schlein, MD       Future Appointments             In 5 months [name], Marzella Schlein, MD [facility], [facility]   In 8 months Car[place] Ching, [facility]

## 2023-03-25 DIAGNOSIS — H52223 Regular astigmatism, bilateral: Secondary | ICD-10-CM | POA: Diagnosis not present

## 2023-03-25 DIAGNOSIS — H524 Presbyopia: Secondary | ICD-10-CM | POA: Diagnosis not present

## 2023-03-25 DIAGNOSIS — Z961 Presence of intraocular lens: Secondary | ICD-10-CM | POA: Diagnosis not present

## 2023-03-25 DIAGNOSIS — H04123 Dry eye syndrome of bilateral lacrimal glands: Secondary | ICD-10-CM | POA: Diagnosis not present

## 2023-03-25 DIAGNOSIS — H5203 Hypermetropia, bilateral: Secondary | ICD-10-CM | POA: Diagnosis not present

## 2023-03-25 DIAGNOSIS — H26493 Other secondary cataract, bilateral: Secondary | ICD-10-CM | POA: Diagnosis not present

## 2023-04-12 DIAGNOSIS — H26493 Other secondary cataract, bilateral: Secondary | ICD-10-CM | POA: Diagnosis not present

## 2023-04-21 DIAGNOSIS — R197 Diarrhea, unspecified: Secondary | ICD-10-CM | POA: Diagnosis not present

## 2023-04-21 DIAGNOSIS — K5100 Ulcerative (chronic) pancolitis without complications: Secondary | ICD-10-CM | POA: Diagnosis not present

## 2023-04-22 DIAGNOSIS — K5100 Ulcerative (chronic) pancolitis without complications: Secondary | ICD-10-CM | POA: Diagnosis not present

## 2023-04-22 DIAGNOSIS — R197 Diarrhea, unspecified: Secondary | ICD-10-CM | POA: Diagnosis not present

## 2023-06-10 ENCOUNTER — Other Ambulatory Visit: Payer: Self-pay | Admitting: Family Medicine

## 2023-06-10 DIAGNOSIS — M810 Age-related osteoporosis without current pathological fracture: Secondary | ICD-10-CM

## 2023-06-14 DIAGNOSIS — E0590 Thyrotoxicosis, unspecified without thyrotoxic crisis or storm: Secondary | ICD-10-CM | POA: Diagnosis not present

## 2023-06-14 DIAGNOSIS — E042 Nontoxic multinodular goiter: Secondary | ICD-10-CM | POA: Diagnosis not present

## 2023-06-22 DIAGNOSIS — E0590 Thyrotoxicosis, unspecified without thyrotoxic crisis or storm: Secondary | ICD-10-CM | POA: Diagnosis not present

## 2023-06-22 DIAGNOSIS — E042 Nontoxic multinodular goiter: Secondary | ICD-10-CM | POA: Diagnosis not present

## 2023-06-23 DIAGNOSIS — Z7901 Long term (current) use of anticoagulants: Secondary | ICD-10-CM | POA: Diagnosis not present

## 2023-06-23 DIAGNOSIS — K5730 Diverticulosis of large intestine without perforation or abscess without bleeding: Secondary | ICD-10-CM | POA: Diagnosis not present

## 2023-06-23 DIAGNOSIS — I2510 Atherosclerotic heart disease of native coronary artery without angina pectoris: Secondary | ICD-10-CM | POA: Diagnosis not present

## 2023-06-23 DIAGNOSIS — K5100 Ulcerative (chronic) pancolitis without complications: Secondary | ICD-10-CM | POA: Diagnosis not present

## 2023-06-23 DIAGNOSIS — I872 Venous insufficiency (chronic) (peripheral): Secondary | ICD-10-CM | POA: Diagnosis not present

## 2023-06-29 ENCOUNTER — Other Ambulatory Visit: Payer: Self-pay | Admitting: Family Medicine

## 2023-07-07 ENCOUNTER — Other Ambulatory Visit: Payer: Self-pay | Admitting: Family Medicine

## 2023-07-08 NOTE — Telephone Encounter (Signed)
 Requested medications are due for refill today.  unsure  Requested medications are on the active medications list.  yes  Last refill. 01/07/2023  Future visit scheduled.   yes  Notes to clinic.  Shown as historical, however Dr. Madeline signed last rx for this.    Requested Prescriptions  Pending Prescriptions Disp Refills   Bacillus Coagulans-Inulin (PROBIOTIC-PREBIOTIC) 1-250 BILLION-MG CAPS [Pharmacy Med Name: PROBIOTIC-PREBIOTIC 1-250 BILLION-M] 90 capsule 1    Sig: TAKE 1 CAPSULE BY MOUTH DAILY     Off-Protocol Failed - 07/08/2023  1:05 PM      Failed - Medication not assigned to a protocol, review manually.      Passed - Valid encounter within last 12 months    Recent Outpatient Visits           4 months ago Essential (primary) hypertension   [place] [facility] [place], [name], MD   5 months ago Cystitis   [place] [facility] [place], [name], MD   7 months ago Status post above-knee amputation of left lower extremity [facility])   [place] [facility] [place], [name], MD   9 months ago Right ureteral stone   [place] [facility] [place], [name], MD   11 months ago Encounter for annual physical exam   [place] [facility] [place], [name], MD       Future Appointments             In 2 months [name], [name], MD [facility]   In 4 months [name], [place] [facility] [place]            Signed Prescriptions Disp Refills   famotidine  (PEPCID ) 20 MG tablet 180 tablet 1    Sig: TAKE 1 TABLET BY MOUTH TWICE A DAY     Gastroenterology:  H2 Antagonists Passed - 07/08/2023  1:05 PM      Passed - Valid encounter within last 12 months    Recent Outpatient Visits           4 months ago Essential (primary) hypertension   [place] [facility] [place], [name], MD   5 months ago  Cystitis   [place] [facility] [place], [name], MD   7 months ago Status post above-knee amputation of left lower extremity [facility])   [place] [facility] [place], [name], MD   9 months ago Right ureteral stone   [place] [facility] [place], [name], MD   11 months ago Encounter for annual physical exam   [place] [facility] [place], [name], MD       Future Appointments             In 2 months [name], [name], MD [facility]   In 4 months [place], [place], [place] [facility] [place]             ezetimibe  (ZETIA ) 10 MG tablet 90 tablet 1    Sig: TAKE 1 TABLET BY MOUTH DAILY     Cardiovascular:  Antilipid - Sterol Transport Inhibitors Failed - 07/08/2023  1:05 PM      Failed - Lipid Panel in normal range within the last 12 months    Cholesterol, Total  Date Value Ref Range Status  03/04/2023 168 100 - 199 mg/dL Final   LDL Chol Calc (NIH)  Date Value  Ref Range Status  03/04/2023 87 0 - 99 mg/dL Final   HDL  Date Value Ref Range Status  03/04/2023 62 >39 mg/dL Final   Triglycerides  Date Value Ref Range Status  03/04/2023 104 0 - 149 mg/dL Final         Passed - AST in normal range and within 360 days    AST  Date Value Ref Range Status  03/04/2023 16 0 - 40 IU/L Final   SGOT(AST)  Date Value Ref Range Status  02/26/2013 30 15 - 37 Unit/L Final         Passed - ALT in normal range and within 360 days    ALT  Date Value Ref Range Status  03/04/2023 9 0 - 32 IU/L Final   SGPT (ALT)  Date Value Ref Range Status  02/26/2013 28 12 - 78 U/L Final         Passed - Patient is not pregnant      Passed - Valid encounter within last 12 months    Recent Outpatient Visits           4 months ago Essential (primary) hypertension   [place] [facility] [place], [name], MD   5 months ago Cystitis    [place] [facility] [place], [name], MD   7 months ago Status post above-knee amputation of left lower extremity [facility])   [place] [facility] [place], [name], MD   9 months ago Right ureteral stone   [place] [facility] [place], [name], MD   11 months ago Encounter for annual physical exam   [place] [facility] [place], [name], MD       Future Appointments             In 2 months [name], [name], MD [facility]   In 4 months [name], [facility] Urology [place]

## 2023-07-08 NOTE — Telephone Encounter (Signed)
 Requested Prescriptions  Pending Prescriptions Disp Refills   Bacillus Coagulans-Inulin (PROBIOTIC-PREBIOTIC) 1-250 BILLION-MG CAPS [Pharmacy Med Name: PROBIOTIC-PREBIOTIC 1-250 BILLION-M] 90 capsule 1    Sig: TAKE 1 CAPSULE BY MOUTH DAILY     Off-Protocol Failed - 07/08/2023  1:04 PM      Failed - Medication not assigned to a protocol, review manually.      Passed - Valid encounter within last 12 months    Recent Outpatient Visits           4 months ago Essential (primary) hypertension   [place] [facility] [place], Misty HERO, MD   5 months ago Cystitis   [place] [facility] [place], Misty HERO, MD   7 months ago Status post above-knee amputation of left lower extremity [facility])   [place] [facility] Misty Page, Misty HERO, MD   9 months ago Right ureteral stone   [place] [facility] [place], Misty HERO, MD   11 months ago Encounter for annual physical exam   [place] [facility] Misty Page, Misty HERO, MD       Future Appointments             In 2 months Misty Page, Misty HERO, MD [facility]   In 4 months Misty Page, [place] [facility] [place]             famotidine  (PEPCID ) 20 MG tablet [Pharmacy Med Name: FAMOTIDINE  20 MG TAB] 180 tablet 1    Sig: TAKE 1 TABLET BY MOUTH TWICE A DAY     Gastroenterology:  H2 Antagonists Passed - 07/08/2023  1:04 PM      Passed - Valid encounter within last 12 months    Recent Outpatient Visits           4 months ago Essential (primary) hypertension   [place] [facility] [place], Misty HERO, MD   5 months ago Cystitis   [place] [facility] [place], Misty HERO, MD   7 months ago Status post above-knee amputation of left lower extremity [facility])   [place] [facility] Misty Page, Misty HERO, MD   9 months ago Right ureteral stone   [facility] Mist[facility] Page, Misty HERO, MD   11 months ago Encounter for annual physical exam   [place] [facility] [place], Misty HERO, MD       Future Appointments             In 2 months Misty Page, Misty HERO, MD [facility] [place], [facility]   In 4 months Page, [name], [name]  Urology [place]             ezetimibe  (ZETIA ) 10 MG tablet [Pharmacy Med Name: EZETIMIBE  10 MG TAB] 90 tablet 1    Sig: TAKE 1 TABLET BY MOUTH DAILY     Cardiovascular:  Antilipid - Sterol Transport Inhibitors Failed - 07/08/2023  1:04 PM      Failed - Lipid Panel in normal range within the last 12 months    Cholesterol, Total  Date Value Ref Range Status  03/04/2023 168 100 - 199 mg/dL Final   LDL Chol Calc (NIH)  Date Value Ref Range Status  03/04/2023 87 0 - 99 mg/dL Final   HDL  Date Value Ref Range Status  03/04/2023 62 >39 mg/dL Final   Triglycerides  Date Value Ref Range Status  03/04/2023 104 0 - 149  mg/dL Final         Passed - AST in normal range and within 360 days    AST  Date Value Ref Range Status  03/04/2023 16 0 - 40 IU/L Final   SGOT(AST)  Date Value Ref Range Status  02/26/2013 30 15 - 37 Unit/L Final         Passed - ALT in normal range and within 360 days    ALT  Date Value Ref Range Status  03/04/2023 9 0 - 32 IU/L Final   SGPT (ALT)  Date Value Ref Range Status  02/26/2013 28 12 - 78 U/L Final         Passed - Patient is not pregnant      Passed - Valid encounter within last 12 months    Recent Outpatient Visits           4 months ago Essential (primary) hypertension   [place] [facility] [place], Misty HERO, MD   5 months ago Cystitis   [place] [facility] Misty Page Misty HERO, MD   7 months ago Status post above-knee amputation of left lower extremity [facility])   [place] [facility] Misty Page, Misty HERO, MD   9 months ago  Right ureteral stone   [place] [facility] Misty Page, Misty HERO, MD   11 months ago Encounter for annual physical exam   [place] [facility] Misty Page, Misty HERO, MD       Future Appointments             In 2 months Misty Page, Misty HERO, MD [facility], [facility]   In 4 months Misty Page, [facility] [place]

## 2023-07-15 ENCOUNTER — Other Ambulatory Visit (INDEPENDENT_AMBULATORY_CARE_PROVIDER_SITE_OTHER): Payer: Self-pay | Admitting: Vascular Surgery

## 2023-07-15 DIAGNOSIS — I70213 Atherosclerosis of native arteries of extremities with intermittent claudication, bilateral legs: Secondary | ICD-10-CM

## 2023-07-15 DIAGNOSIS — K551 Chronic vascular disorders of intestine: Secondary | ICD-10-CM

## 2023-07-16 NOTE — Progress Notes (Unsigned)
MRN : [id number]  Misty Page is a 83 y.o. (25-Feb-1941) female who presents with chief complaint of check carotid arteries.  History of Present Illness:   Patient presents today after intervention on 02/13/2020 for acute on chronic mesenteric ischemia.  The patient notes that since she her intervention her abdominal pain has resolved.  She denies any nausea or vomiting.  She denies any food phobias.  She denies any fever, chills, nausea, vomiting or diarrhea.  There was also a stent placed within the SMA.     The patient is also seen for follow up evaluation of carotid stenosis. The carotid stenosis followed by ultrasound.    The patient denies amaurosis fugax. There is no recent history of TIA symptoms or focal motor deficits. There is no prior documented CVA.   The patient is taking enteric-coated aspirin 81 mg daily.   No history of rest pain symptoms. No new ulcers or wounds of the lower extremities have occurred.   There is no history of DVT, PE or superficial thrombophlebitis. No recent episodes of angina or shortness of breath documented.    Duplex ultrasound of the mesenteric arteries done today shows the stent placed in the superior mesenteric artery is patent.  Moderate increased velocities at the origin of both the SMA and the celiac arteries.    Previous carotid duplex shows <40% stenosis bilaterally, no significant change compared to last study   Noninvasive studies dated 02/17/2022 show  ABI Rt=0.96 (TBI=0.96) and Lt=AKA. Patient has triphasic right posterior tibial artery signal. (Previous ABI Rt=0.97 and Lt=AKA)    No outpatient medications have been marked as taking for the 07/18/23 encounter (Appointment) with Gilda Crease, Latina Craver, MD.   Current Facility-Administered Medications for the 07/18/23 encounter (Appointment) with Gilda Crease, Latina Craver, MD  Medication   sulfamethoxazole-trimethoprim (BACTRIM DS) 800-160 MG per tablet 1 tablet    Past  Medical History:  Diagnosis Date   Allergy    Anemia    Arthritis    CA of skin 10/15/2014   Calculus of kidney 10/15/2014   Seen in ER 06/16/10.    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   Coronary artery disease    Diverticulitis    Environmental and seasonal allergies    Fibrocystic breast disease (FCBD)    GERD (gastroesophageal reflux disease)    Heart murmur    High cholesterol    History of heart artery stent    History of kidney stones    Hyperlipidemia    Hypertension    Hyperthyroidism    Myocardial infarction (HCC)    Osteomyelitis of left femur (HCC) 11/30/2018   Osteoporosis    PONV (postoperative nausea and vomiting)    Reactive depression 02/25/2019   Thyroid disease    TIA (transient ischemic attack)     Past Surgical History:  Procedure Laterality Date   ABDOMINAL HYSTERECTOMY  2007   BREAST ENHANCEMENT SURGERY  2004   BREAST IMPLANT REMOVAL     BREAST SURGERY  1984   Fibrocystic Disease   CAROTID ENDARTERECTOMY Left 06/21/2014   Dr. Gilda Crease   CAROTID ENDARTERECTOMY Right 08/02/2014   Dr. Gilda Crease   CAROTID PTA/STENT INTERVENTION N/A 06/15/2017   Procedure: CAROTID PTA/STENT INTERVENTION;  Surgeon: Renford Dills, MD;  Location: [facility] INVASIVE CV LAB;  Service: Cardiovascular;  Laterality: N/A;   COLONOSCOPY WITH PROPOFOL N/A 07/14/2015   Procedure: COLONOSCOPY WITH PROPOFOL;  Surgeon: Wallace Cullens, MD;  [facility] ENDOSCOPY;  Service: Gastroenterology;  Laterality: N/A;   COLONOSCOPY WITH PROPOFOL N/A 02/08/2018   Procedure: COLONOSCOPY WITH PROPOFOL;  Surgeon: Toledo, Boykin Nearing, MD;  Location: [facility] ENDOSCOPY;  Service: Gastroenterology;  Laterality: N/A;   CORONARY ANGIOPLASTY WITH STENT PLACEMENT  1999   CYSTOSCOPY WITH STENT PLACEMENT Right 09/20/2022   Procedure: CYSTOSCOPY WITH STENT PLACEMENT;  Surgeon: [name], MD;  Location: [facility] ORS;  Service: Urology;  Laterality:  Right;   CYSTOSCOPY/URETEROSCOPY/HOLMIUM LASER/STENT PLACEMENT Right 10/07/2022   Procedure: CYSTOSCOPY/URETEROSCOPY/HOLMIUM LASER/STENT PLACEMENT;  Surgeon: [name], MD;  Location: [facility] ORS;  Service: Urology;  Laterality: Right;   ESOPHAGOGASTRODUODENOSCOPY (EGD) WITH PROPOFOL N/A 02/08/2018   Procedure: ESOPHAGOGASTRODUODENOSCOPY (EGD) WITH PROPOFOL;  Surgeon: Toledo, Boykin Nearing, MD;  Location: [facility] ENDOSCOPY;  Service: Gastroenterology;  Laterality: N/A;   FEMORAL ARTERY STENT  08/2003   HERNIA REPAIR  2008   LEG AMPUTATION AT HIP Left 02/28/2019   MASTECTOMY     RENAL ARTERY STENT  08/2003   TRANSUMBILICAL AUGMENTATION MAMMAPLASTY     VASCULAR SURGERY     VISCERAL ARTERY INTERVENTION N/A 02/13/2020   Procedure: VISCERAL ARTERY INTERVENTION;  Surgeon: Annice Needy, MD;  Location: [facility] INVASIVE CV LAB;  Service: Cardiovascular;  Laterality: N/A;    Social History Social History   Tobacco Use   Smoking status: Former    Current packs/day: 0.00    Types: Cigarettes    Quit date: 05/30/1994    Years since quitting: 29.1   Smokeless tobacco: Never  Vaping Use   Vaping status: Never Used  Substance Use Topics   Alcohol use: Not Currently    Comment: rare- 1 glass of wine EOW   Drug use: No    Family History Family History  Problem Relation Age of Onset   Hyperlipidemia Mother    Congestive Heart Failure Mother    COPD Mother    Heart disease Mother    Hypertension Mother    Osteoporosis Mother    Pancreatic cancer Father    Arthritis Sister    Alzheimer's disease Sister    Hypertension Sister    Prostate cancer Brother    Heart attack Brother    Stomach cancer Brother    Kidney failure Brother    Leukemia Brother    Emphysema Brother    Pancreatic cancer Brother    Stomach cancer Brother    Breast cancer Paternal Grandmother    Leukemia Paternal Grandfather    Heart attack Maternal Uncle    Stroke Maternal Aunt     Allergies  Allergen Reactions    Amoxicillin Anaphylaxis   Cephalosporins Swelling and Rash    Lip swelling and rash on CTX and Vancomycin.   Naproxen Hives, Shortness Of Breath, Nausea And Vomiting and Swelling   Penicillins Shortness Of Breath, Swelling and Rash    Tolerated ceftriaxone 08/2022   Codeine Nausea And Vomiting   Levofloxacin Nausea And Vomiting   Shellfish Allergy Hives, Diarrhea, Nausea And Vomiting and Swelling   Vancomycin Swelling and Rash    Occurred while on CTX and Vancomycin. More likely CTX as she has anaphylaxis to PCN, but should monitor closely.     REVIEW OF SYSTEMS (Negative unless checked)  Constitutional: [] Weight loss  [] Fever  [] Chills Cardiac: [] Chest pain   [] Chest pressure   [] Palpitations   [] Shortness of breath when laying  flat   [] Shortness of breath with exertion. Vascular:  [x] Pain in legs with walking   [] Pain in legs at rest  [] History of DVT   [] Phlebitis   [] Swelling in legs   [] Varicose veins   [] Non-healing ulcers Pulmonary:   [] Uses home oxygen   [] Productive cough   [] Hemoptysis   [] Wheeze  [] COPD   [] Asthma Neurologic:  [] Dizziness   [] Seizures   [] History of stroke   [] History of TIA  [] Aphasia   [] Vissual changes   [] Weakness or numbness in arm   [] Weakness or numbness in leg Musculoskeletal:   [] Joint swelling   [] Joint pain   [] Low back pain Hematologic:  [] Easy bruising  [] Easy bleeding   [] Hypercoagulable state   [] Anemic Gastrointestinal:  [] Diarrhea   [] Vomiting  [] Gastroesophageal reflux/heartburn   [] Difficulty swallowing. Genitourinary:  [] Chronic kidney disease   [] Difficult urination  [] Frequent urination   [] Blood in urine Skin:  [] Rashes   [] Ulcers  Psychological:  [] History of anxiety   []  History of major depression.  Physical Examination  There were no vitals filed for this visit. There is no height or weight on file to calculate BMI. Gen: WD/WN, NAD Head: Guys/AT, No temporalis wasting.  Ear/Nose/Throat: Hearing grossly intact, nares w/o erythema or  drainage Eyes: PER, EOMI, sclera nonicteric.  Neck: Supple, no masses.  No bruit or JVD.  Pulmonary:  Good air movement, no audible wheezing, no use of accessory muscles.  Cardiac: RRR, normal S1, S2, no Murmurs. Vascular:   Vessel Right Left  Radial Palpable Palpable  Carotid  Palpable  AKA  Subclav  Palpable AKA  Gastrointestinal: soft, non-distended. No guarding/no peritoneal signs.  Musculoskeletal: M/S 5/5 throughout.  No visible deformity.  Neurologic: CN 2-12 intact. Pain and light touch intact in extremities.  Symmetrical.  Speech is fluent. Motor exam as listed above. Psychiatric: Judgment intact, Mood & affect appropriate for pt's clinical situation. Dermatologic: No rashes or ulcers noted.  No changes consistent with cellulitis.   CBC Lab Results  Component Value Date   WBC 5.2 03/04/2023   HGB 12.5 03/04/2023   HCT 42.7 03/04/2023   MCV 86 03/04/2023   PLT 324 03/04/2023    BMET    Component Value Date/Time   NA 140 03/04/2023 1136   NA 140 06/22/2014 0417   K 3.9 03/04/2023 1136   K 3.5 06/22/2014 0417   CL 103 03/04/2023 1136   CL 105 06/22/2014 0417   CO2 24 03/04/2023 1136   CO2 27 06/22/2014 0417   GLUCOSE 112 (H) 03/04/2023 1136   GLUCOSE 108 (H) 09/23/2022 0550   GLUCOSE 92 06/22/2014 0417   BUN 15 03/04/2023 1136   BUN 9 06/22/2014 0417   CREATININE 0.47 (L) 03/04/2023 1136   CREATININE 0.65 06/22/2014 0417   CALCIUM 9.3 03/04/2023 1136   CALCIUM 7.7 (L) 06/22/2014 0417   GFRNONAA >60 09/23/2022 0550   GFRNONAA >60 06/22/2014 0417   GFRNONAA >60 02/26/2013 1510   GFRAA 107 04/28/2020 1143   GFRAA >60 06/22/2014 0417   GFRAA >60 02/26/2013 1510   CrCl cannot be calculated (Patient's most recent lab result is older than the maximum 21 days allowed.).  COAG Lab Results  Component Value Date   INR 1.1 09/19/2022   INR 0.8 02/12/2020   INR 0.9 05/17/2019    Radiology No results found.   Assessment/Plan 1. Chronic mesenteric ischemia  (HCC) (Primary) Recommend:   The patient is status post successful angiogram with intervention of the mesenteric vessels.  The patient reports that the abdominal pain is improved and the post prandial symptoms are essentially gone.   The patient denies lifestyle limiting changes at this point in time.   No further invasive studies, angiography or surgery at this time The patient should continue walking and begin a more formal exercise program.  The patient should continue antiplatelet therapy and aggressive treatment of the lipid abnormalities   Patient should undergo noninvasive studies as ordered. The patient will follow up with me after the studies  - VAS [place] MESENTERIC; Future  2. Bilateral carotid artery stenosis Recommend:   Given the patient's asymptomatic subcritical stenosis no further invasive testing or surgery at this time.   Duplex ultrasound shows 1-39% stenosis bilaterally.   Continue antiplatelet therapy as prescribed Continue management of CAD, HTN and Hyperlipidemia Healthy heart diet,  encouraged exercise at least 4 times per week Follow up in 24 months with duplex ultrasound and physical exam    3. Atherosclerosis of native artery of both lower extremities with intermittent claudication (HCC) Recommend:   The patient has evidence of atherosclerosis of the right lower extremity.  She is status post left AKA the patient does not voice lifestyle limiting changes at this point in time.   No invasive studies, angiography or surgery at this time The patient should continue walking and begin a more formal exercise program.  The patient should continue antiplatelet therapy and aggressive treatment of the lipid abnormalities   No changes in the patient's medications at this time   Continued surveillance is indicated as atherosclerosis is likely to progress with time.     The patient will continue follow up with noninvasive studies as ordered.   - VAS [place] ABI  WITH/WO TBI; Future  4. Lymphedema Recommend:  No surgery or intervention at this point in time.  I have reviewed my discussion with the patient regarding venous insufficiency and why it causes symptoms. I have discussed with the patient the chronic skin changes that accompany venous insufficiency and the long term sequela such as ulceration. Patient will contnue wearing graduated compression stockings on a daily basis, as this has provided excellent control of his edema. The patient will put the stockings on first thing in the morning and removing them in the evening. The patient is reminded not to sleep in the stockings.  In addition, behavioral modification including elevation during the day will be initiated. Exercise is strongly encouraged.  Previous duplex ultrasound of the lower extremities shows normal deep system, no significant superficial reflux was identified.  Given the patient's good control and lack of any problems regarding the venous insufficiency and lymphedema a lymph pump in not need at this time.    5. Hypercholesteremia Continue statin as ordered and reviewed, no changes at this time  6. Arteriosclerosis of coronary artery Continue cardiac and antihypertensive medications as already ordered and reviewed, no changes at this time.  Continue statin as ordered and reviewed, no changes at this time  Nitrates PRN for chest pain    Levora Dredge, MD  07/16/2023 3:45 PM

## 2023-07-18 ENCOUNTER — Ambulatory Visit (INDEPENDENT_AMBULATORY_CARE_PROVIDER_SITE_OTHER): Payer: Medicare HMO

## 2023-07-18 ENCOUNTER — Encounter (INDEPENDENT_AMBULATORY_CARE_PROVIDER_SITE_OTHER): Payer: Self-pay | Admitting: Vascular Surgery

## 2023-07-18 ENCOUNTER — Ambulatory Visit (INDEPENDENT_AMBULATORY_CARE_PROVIDER_SITE_OTHER): Payer: PPO | Admitting: Vascular Surgery

## 2023-07-18 VITALS — BP 148/70 | HR 68 | Resp 16

## 2023-07-18 DIAGNOSIS — I890 Lymphedema, not elsewhere classified: Secondary | ICD-10-CM

## 2023-07-18 DIAGNOSIS — I70213 Atherosclerosis of native arteries of extremities with intermittent claudication, bilateral legs: Secondary | ICD-10-CM

## 2023-07-18 DIAGNOSIS — I2510 Atherosclerotic heart disease of native coronary artery without angina pectoris: Secondary | ICD-10-CM | POA: Diagnosis not present

## 2023-07-18 DIAGNOSIS — K551 Chronic vascular disorders of intestine: Secondary | ICD-10-CM

## 2023-07-18 DIAGNOSIS — E7800 Pure hypercholesterolemia, unspecified: Secondary | ICD-10-CM | POA: Diagnosis not present

## 2023-07-18 DIAGNOSIS — I6523 Occlusion and stenosis of bilateral carotid arteries: Secondary | ICD-10-CM

## 2023-07-21 LAB — VAS US ABI WITH/WO TBI: Right ABI: 0.96

## 2023-07-27 ENCOUNTER — Other Ambulatory Visit: Payer: Self-pay | Admitting: Family Medicine

## 2023-07-28 NOTE — Telephone Encounter (Signed)
 Requested Prescriptions  Pending Prescriptions Disp Refills   Ascorbic Acid (VITAMIN C) 1000 MG tablet [Pharmacy Med Name: VITAMIN C 1000 MG TAB] 90 tablet 1    Sig: TAKE 1 TABLET BY MOUTH EVERY MORNING     Endocrinology:  Vitamins Passed - 07/28/2023 11:09 AM      Passed - Valid encounter within last 12 months    Recent Outpatient Visits           4 months ago Essential (primary) hypertension   [place] [facility] [place], Marzella Schlein, MD   6 months ago Cystitis   [place] [facility] Erasmo Downer, MD   8 months ago Status post above-knee amputation of left lower extremity [facility])   [place] [facility] Beryle Flock, Marzella Schlein, MD   10 months ago Right ureteral stone   [place] [facility] [place], Marzella Schlein, MD   1 year ago Encounter for annual physical exam    [facility] [place], Marzella Schlein, MD       Future Appointments             In 1 month [name], Marzella Schlein, MD [facility], [facility]   In 3 months Carman Ching, [facility] [place]

## 2023-08-26 DIAGNOSIS — D2272 Melanocytic nevi of left lower limb, including hip: Secondary | ICD-10-CM | POA: Diagnosis not present

## 2023-08-26 DIAGNOSIS — L820 Inflamed seborrheic keratosis: Secondary | ICD-10-CM | POA: Diagnosis not present

## 2023-08-26 DIAGNOSIS — D225 Melanocytic nevi of trunk: Secondary | ICD-10-CM | POA: Diagnosis not present

## 2023-08-26 DIAGNOSIS — Z85828 Personal history of other malignant neoplasm of skin: Secondary | ICD-10-CM | POA: Diagnosis not present

## 2023-08-26 DIAGNOSIS — D2261 Melanocytic nevi of right upper limb, including shoulder: Secondary | ICD-10-CM | POA: Diagnosis not present

## 2023-08-26 DIAGNOSIS — D2262 Melanocytic nevi of left upper limb, including shoulder: Secondary | ICD-10-CM | POA: Diagnosis not present

## 2023-08-26 DIAGNOSIS — D485 Neoplasm of uncertain behavior of skin: Secondary | ICD-10-CM | POA: Diagnosis not present

## 2023-08-26 DIAGNOSIS — D2339 Other benign neoplasm of skin of other parts of face: Secondary | ICD-10-CM | POA: Diagnosis not present

## 2023-09-03 ENCOUNTER — Other Ambulatory Visit: Payer: Self-pay | Admitting: Family Medicine

## 2023-09-03 DIAGNOSIS — I10 Essential (primary) hypertension: Secondary | ICD-10-CM

## 2023-09-09 ENCOUNTER — Ambulatory Visit (INDEPENDENT_AMBULATORY_CARE_PROVIDER_SITE_OTHER): Payer: Self-pay | Admitting: Family Medicine

## 2023-09-09 ENCOUNTER — Encounter: Payer: Self-pay | Admitting: Family Medicine

## 2023-09-09 VITALS — BP 138/68 | HR 82 | Ht <= 58 in

## 2023-09-09 DIAGNOSIS — R251 Tremor, unspecified: Secondary | ICD-10-CM | POA: Insufficient documentation

## 2023-09-09 DIAGNOSIS — K5190 Ulcerative colitis, unspecified, without complications: Secondary | ICD-10-CM | POA: Diagnosis not present

## 2023-09-09 DIAGNOSIS — I10 Essential (primary) hypertension: Secondary | ICD-10-CM

## 2023-09-09 DIAGNOSIS — D692 Other nonthrombocytopenic purpura: Secondary | ICD-10-CM

## 2023-09-09 DIAGNOSIS — R739 Hyperglycemia, unspecified: Secondary | ICD-10-CM

## 2023-09-09 DIAGNOSIS — Z0000 Encounter for general adult medical examination without abnormal findings: Secondary | ICD-10-CM

## 2023-09-09 DIAGNOSIS — G546 Phantom limb syndrome with pain: Secondary | ICD-10-CM | POA: Diagnosis not present

## 2023-09-09 DIAGNOSIS — Z89612 Acquired absence of left leg above knee: Secondary | ICD-10-CM | POA: Diagnosis not present

## 2023-09-09 DIAGNOSIS — E7800 Pure hypercholesterolemia, unspecified: Secondary | ICD-10-CM | POA: Diagnosis not present

## 2023-09-09 DIAGNOSIS — D509 Iron deficiency anemia, unspecified: Secondary | ICD-10-CM | POA: Diagnosis not present

## 2023-09-09 NOTE — Progress Notes (Signed)
 Complete physical exam   Patient: Misty Page   DOB: 02-10-1941   83 y.o. Female  MRN: [id number] Visit Date: 09/09/2023  Today's healthcare provider: Shirlee Latch, MD   Chief Complaint  Patient presents with   Annual Exam    Last completed 07/23/22 Diet -  No additional sodium  Exercise -  Feeling - well Sleeping - well Concerns - tremors/shakes X 6 months. Noticed by lady that cuts her hair. Noticing in jaw line and hands shaking more   Subjective    Misty Page is a 83 y.o. female who presents today for a complete physical exam.    Discussed the use of AI scribe software for clinical note transcription with the patient, who gave verbal consent to proceed.  History of Present Illness   The patient, with a history of amputation, cancer, presents with ongoing tremors. She attributes these tremors to her medication, gabapentin, and reports that they are more pronounced when reaching for something. The patient also mentions occasional phantom limb pain, which is well-managed with gabapentin. She reports a cyst under her arm, which has been evaluated by a dermatologist and deemed not concerning. The patient has a history of heart issues but has not seen a cardiologist in years. She reports no current cardiac symptoms. The patient's blood pressure was initially high but decreased upon recheck.        Last depression screening scores    03/07/2023   11:49 AM 12/27/2022    8:39 AM 12/22/2021    9:08 AM  PHQ 2/9 Scores  PHQ - 2 Score 0 2 2  PHQ- 9 Score  4 4   Last fall risk screening    12/27/2022    8:26 AM  Fall Risk   Falls in the past year? 0  Number falls in past yr: 0  Injury with Fall? 0  Risk for fall due to : No Fall Risks  Follow up Education provided;Falls prevention discussed        Medications: Outpatient Medications Prior to Visit  Medication Sig   acetaminophen (TYLENOL) 500 MG tablet Take 500-1,000 mg by mouth every 8 (eight) hours as  needed.   amLODipine (NORVASC) 5 MG tablet TAKE 1 TABLET BY MOUTH DAILY   Ascorbic Acid (VITAMIN C) 1000 MG tablet TAKE 1 TABLET BY MOUTH EVERY MORNING   aspirin EC 81 MG tablet Take 81 mg by mouth daily. Swallow whole.   Bacillus Coagulans-Inulin (PROBIOTIC-PREBIOTIC) 1-250 BILLION-MG CAPS TAKE 1 CAPSULE BY MOUTH DAILY   CALCIUM+D3 600-20 MG-MCG TABS TAKE 1 TABLET BY MOUTH DAILY   clopidogrel (PLAVIX) 75 MG tablet TAKE 1 TABLET BY MOUTH DAILY   ezetimibe (ZETIA) 10 MG tablet TAKE 1 TABLET BY MOUTH DAILY   famotidine (PEPCID) 20 MG tablet TAKE 1 TABLET BY MOUTH TWICE A DAY   fluticasone (FLONASE) 50 MCG/ACT nasal spray USE 2 SPRAYS EACH NOSTRIL DAILY (Patient taking differently: Place 1 spray into both nostrils at bedtime as needed for allergies.)   gabapentin (NEURONTIN) 300 MG capsule Take 1 capsule (300 mg total) by mouth 2 (two) times daily.   iron polysaccharides (NIFEREX) 150 MG capsule Take 1 capsule (150 mg total) by mouth daily.   losartan (COZAAR) 100 MG tablet TAKE 1 TABLET BY MOUTH DAILY   meclizine (ANTIVERT) 25 MG tablet Take 1 tablet (25 mg total) by mouth 3 (three) times daily as needed for dizziness or nausea.   Mesalamine 800 MG TBEC Take 800 mg by  mouth in the morning, at noon, and at bedtime.   methimazole (TAPAZOLE) 5 MG tablet Take 5 mg by mouth. 1 Tablet on Mondays, Wednesdays and Fridays and take 1/2 tablet daily the rest of the week.   rosuvastatin (CRESTOR) 20 MG tablet TAKE 1/2 TABLET BY MOUTH DAILY   HYDROcodone-acetaminophen (NORCO/VICODIN) 5-325 MG tablet Take 1-2 tablets by mouth every 6 (six) hours as needed for moderate pain. (Patient not taking: Reported on 09/09/2023)   Probiotic Product (PROBIOTIC DAILY PO) Take 1 capsule by mouth daily. (Patient not taking: Reported on 09/09/2023)   Facility-Administered Medications Prior to Visit  Medication Dose Route Frequency Provider   sulfamethoxazole-trimethoprim (BACTRIM DS) 800-160 MG per tablet 1 tablet  1 tablet  Oral Q12H [name], MD    Review of Systems    Objective    BP 138/68 (BP Location: Right Arm, Patient Position: Sitting, Cuff Size: Large)   Pulse 82   Ht 1' (0.305 m)   SpO2 95%   BMI 620.07 kg/m    Physical Exam Vitals reviewed.  Constitutional:      General: She is not in acute distress.    Appearance: Normal appearance. She is well-developed. She is not diaphoretic.  HENT:     Head: Normocephalic and atraumatic.     Right Ear: Tympanic membrane, ear canal and external ear normal.     Left Ear: Tympanic membrane, ear canal and external ear normal.     Nose: Nose normal.     Mouth/Throat:     Mouth: Mucous membranes are moist.     Pharynx: Oropharynx is clear. No oropharyngeal exudate.  Eyes:     General: No scleral icterus.    Conjunctiva/sclera: Conjunctivae normal.     Pupils: Pupils are equal, round, and reactive to light.  Neck:     Thyroid: No thyromegaly.  Cardiovascular:     Rate and Rhythm: Normal rate and regular rhythm.     Heart sounds: Normal heart sounds.  Pulmonary:     Effort: Pulmonary effort is normal. No respiratory distress.     Breath sounds: Normal breath sounds. No wheezing or rales.  Abdominal:     General: There is no distension.     Palpations: Abdomen is soft.     Tenderness: There is no abdominal tenderness.  Musculoskeletal:     Cervical back: Neck supple.     Right lower leg: No edema.     Comments: L leg s/p AKA  Lymphadenopathy:     Cervical: No cervical adenopathy.  Skin:    General: Skin is warm and dry.     Findings: No rash.  Neurological:     Mental Status: She is alert and oriented to person, place, and time. Mental status is at baseline.  Psychiatric:        Mood and Affect: Mood normal.        Behavior: Behavior normal.        Thought Content: Thought content normal.      No results found for any visits on 09/09/23.  Assessment & Plan    Routine Health Maintenance and Physical Exam  Exercise  Activities and Dietary recommendations  Goals      DIET - EAT MORE FRUITS AND VEGETABLES     DIET - INCREASE WATER INTAKE     Recommend increasing water intake to 4 glasses of water a day.      LIFESTYLE - DECREASE FALLS RISK     Recommend to remove any items  from the home that may cause slips or trips.        Immunization History  Administered Date(s) Administered   Fluad Quad(high Dose 65+) 02/14/2020, 04/17/2021   Fluad Trivalent(High Dose 65+) 01/28/2023   Influenza Split 04/19/2011   Influenza, High Dose Seasonal PF 02/18/2014, 03/12/2016, 03/07/2019   Influenza,inj,Quad PF,6+ Mos 02/10/2013, 03/07/2017, 03/24/2018   Influenza,inj,quad, With Preservative 03/07/2019   Influenza-Unspecified 02/10/2013, 03/01/2015, 03/31/2022   PFIZER Comirnaty(Gray Top)Covid-19 Tri-Sucrose Vaccine 06/25/2019, 07/16/2019, 04/04/2020   Pfizer Covid-19 Vaccine Bivalent Booster 92yrs & up 11/10/2020, 04/30/2021   Pneumococcal Conjugate-13 05/18/2013   Pneumococcal Polysaccharide-23 03/02/2004, 08/15/2017   Td 02/08/2005, 11/04/2017   Zoster, Live 01/29/2006    Health Maintenance  Topic Date Due   Zoster Vaccines- Shingrix (1 of 2) 12/24/1959   COVID-19 Vaccine (6 - 2024-25 season) 01/30/2023   Medicare Annual Wellness (AWV)  12/27/2023   INFLUENZA VACCINE  12/30/2023   DTaP/Tdap/Td (3 - Tdap) 11/05/2027   Pneumonia Vaccine 24+ Years old  Completed   DEXA SCAN  Completed   HPV VACCINES  Aged Out   Meningococcal B Vaccine  Aged Out    Discussed health benefits of physical activity, and encouraged her to engage in regular exercise appropriate for her age and condition.  Problem List Items Addressed This Visit       Cardiovascular and Mediastinum   Essential (primary) hypertension   Relevant Orders   Comprehensive metabolic panel with GFR   Senile purpura (HCC)     Digestive   Ulcerative colitis (HCC)     Nervous and Auditory   Phantom limb pain (HCC)     Other    Hypercholesteremia   Relevant Orders   Comprehensive metabolic panel with GFR   Lipid panel   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)   Iron deficiency anemia   Relevant Orders   CBC w/Diff/Platelet   Iron, TIBC and Ferritin Panel   Tremor   Other Visit Diagnoses       Encounter for annual physical exam    -  Primary   Relevant Orders   Comprehensive metabolic panel with GFR   Lipid panel   Hemoglobin A1c   CBC w/Diff/Platelet   Iron, TIBC and Ferritin Panel     Hyperglycemia       Relevant Orders   Hemoglobin A1c           Phantom Limb Pain Phantom limb pain is well-controlled with gabapentin, with occasional episodes that are less frequent and severe. Gabapentin dosage is effective, with only one instance requiring an additional dose for breakthrough pain. - Continue gabapentin for phantom limb pain management.  Hypertension Blood pressure improved to 138/68 mmHg after rest. She does not monitor blood pressure at home. Follow-up is planned in six months to reassess. - Schedule a follow-up in six months to monitor blood pressure.  Anemia Anemia has improved with iron supplementation. Discontinuation of iron leads to a drop in blood levels, indicating a need for continued supplementation. - Continue iron supplementation. - Order lab tests to check blood counts and iron levels.  Tremor Experiences an intention tremor, possibly due to gabapentin or an essential tremor with a genetic component. The tremor is not significantly affecting quality of life, and she prefers not to start treatment at this time. Propranolol is an option if treatment becomes necessary, as it is effective with minimal side effects. - Monitor tremor and consider treatment with propranolol if it worsens or affects quality of life.  General Health Maintenance Up to date on pneumonia vaccinations and receives an annual influenza vaccination. Declines shingles vaccine due to a  previous adverse reaction. Considering the RSV vaccine, which is a one-time shot recommended for individuals over 60. The vaccine is available at the pharmacy and is well-tolerated with no significant complaints reported. - Recommend RSV vaccine, available at the pharmacy. - Order routine lab tests including kidney and liver function, cholesterol, blood sugar, and blood counts.       Return in about 6 months (around 03/10/2024) for chronic disease f/u.     Shirlee Latch, MD  [facility] [phone] (phone) [phone] (fax)  [facility]

## 2023-09-10 LAB — COMPREHENSIVE METABOLIC PANEL WITH GFR
ALT: 12 IU/L (ref 0–32)
AST: 16 IU/L (ref 0–40)
Albumin: 4.2 g/dL (ref 3.7–4.7)
Alkaline Phosphatase: 93 IU/L (ref 44–121)
BUN/Creatinine Ratio: 26 (ref 12–28)
BUN: 15 mg/dL (ref 8–27)
Bilirubin Total: 0.3 mg/dL (ref 0.0–1.2)
CO2: 23 mmol/L (ref 20–29)
Calcium: 9.8 mg/dL (ref 8.7–10.3)
Chloride: 98 mmol/L (ref 96–106)
Creatinine, Ser: 0.57 mg/dL (ref 0.57–1.00)
Globulin, Total: 2.1 g/dL (ref 1.5–4.5)
Glucose: 116 mg/dL — ABNORMAL HIGH (ref 70–99)
Potassium: 4.2 mmol/L (ref 3.5–5.2)
Sodium: 139 mmol/L (ref 134–144)
Total Protein: 6.3 g/dL (ref 6.0–8.5)
eGFR: 91 mL/min/{1.73_m2} (ref >59)

## 2023-09-10 LAB — CBC WITH DIFFERENTIAL/PLATELET
Basophils Absolute: 0 10*3/uL (ref 0.0–0.2)
Basos: 1 %
EOS (ABSOLUTE): 0.2 10*3/uL (ref 0.0–0.4)
Eos: 4 %
Hematocrit: 43 % (ref 34.0–46.6)
Hemoglobin: 13.9 g/dL (ref 11.1–15.9)
Immature Grans (Abs): 0 10*3/uL (ref 0.0–0.1)
Immature Granulocytes: 1 %
Lymphocytes Absolute: 1 10*3/uL (ref 0.7–3.1)
Lymphs: 16 %
MCH: 30.2 pg (ref 26.6–33.0)
MCHC: 32.3 g/dL (ref 31.5–35.7)
MCV: 94 fL (ref 79–97)
Monocytes Absolute: 0.5 10*3/uL (ref 0.1–0.9)
Monocytes: 7 %
Neutrophils Absolute: 4.6 10*3/uL (ref 1.4–7.0)
Neutrophils: 71 %
Platelets: 379 10*3/uL (ref 150–450)
RBC: 4.6 x10E6/uL (ref 3.77–5.28)
RDW: 13.4 % (ref 11.7–15.4)
WBC: 6.4 10*3/uL (ref 3.4–10.8)

## 2023-09-10 LAB — LIPID PANEL
Chol/HDL Ratio: 3.1 ratio (ref 0.0–4.4)
Cholesterol, Total: 166 mg/dL (ref 100–199)
HDL: 53 mg/dL (ref >39)
LDL Chol Calc (NIH): 91 mg/dL (ref 0–99)
Triglycerides: 125 mg/dL (ref 0–149)
VLDL Cholesterol Cal: 22 mg/dL (ref 5–40)

## 2023-09-10 LAB — IRON,TIBC AND FERRITIN PANEL
Ferritin: 47 ng/mL (ref 15–150)
Iron Saturation: 23 % (ref 15–55)
Iron: 84 ug/dL (ref 27–139)
Total Iron Binding Capacity: 358 ug/dL (ref 250–450)
UIBC: 274 ug/dL (ref 118–369)

## 2023-09-10 LAB — HEMOGLOBIN A1C
Est. average glucose Bld gHb Est-mCnc: 117 mg/dL
Hgb A1c MFr Bld: 5.7 % — ABNORMAL HIGH (ref 4.8–5.6)

## 2023-09-12 ENCOUNTER — Encounter: Payer: Self-pay | Admitting: Family Medicine

## 2023-09-21 ENCOUNTER — Other Ambulatory Visit: Payer: Self-pay | Admitting: Family Medicine

## 2023-09-22 NOTE — Telephone Encounter (Signed)
 Requested Prescriptions  Pending Prescriptions Disp Refills   rosuvastatin  (CRESTOR ) 10 MG tablet [Pharmacy Med Name: ROSUVASTATIN  CALCIUM  10 MG TAB] 90 tablet 0    Sig: TAKE 1 TABLET BY MOUTH DAILY     Cardiovascular:  Antilipid - Statins 2 Failed - 09/22/2023 12:42 PM      Failed - Lipid Panel in normal range within the last 12 months    Cholesterol, Total  Date Value Ref Range Status  09/09/2023 166 100 - 199 mg/dL Final   LDL Chol Calc (NIH)  Date Value Ref Range Status  09/09/2023 91 0 - 99 mg/dL Final   HDL  Date Value Ref Range Status  09/09/2023 53 >39 mg/dL Final   Triglycerides  Date Value Ref Range Status  09/09/2023 125 0 - 149 mg/dL Final         Passed - Cr in normal range and within 360 days    Creatinine  Date Value Ref Range Status  06/22/2014 0.65 0.60 - 1.30 mg/dL Final   Creatinine, Ser  Date Value Ref Range Status  09/09/2023 0.57 0.57 - 1.00 mg/dL Final         Passed - Patient is not pregnant      Passed - Valid encounter within last 12 months    Recent Outpatient Visits           1 week ago Encounter for annual physical exam   [facility], Stan Eans, MD       Future Appointments             In 1 month Kathreen Pare, [name] [facility] [place]   In 5 months [name], Stan Eans, MD [facility]             gabapentin  (NEURONTIN ) 300 MG capsule [Pharmacy Med Name: GABAPENTIN  300 MG CAP] 180 capsule 0    Sig: TAKE ONE CAPSULE BY MOUTH TWICE A DAY     Neurology: Anticonvulsants - gabapentin  Passed - 09/22/2023 12:42 PM      Passed - Cr in normal range and within 360 days    Creatinine  Date Value Ref Range Status  06/22/2014 0.65 0.60 - 1.30 mg/dL Final   Creatinine, Ser  Date Value Ref Range Status  09/09/2023 0.57 0.57 - 1.00 mg/dL Final         Passed - Completed PHQ-2 or PHQ-9 in the last 360 days      Passed - Valid encounter within  last 12 months    Recent Outpatient Visits           1 week ago Encounter for annual physical exam    [facility] [place], Stan Eans, MD       Future Appointments             In 1 month Vaillancourt, Dearl Fabry [facility] [place]   In 5 months [name], Stan Eans, MD [facility]

## 2023-10-18 ENCOUNTER — Encounter (INDEPENDENT_AMBULATORY_CARE_PROVIDER_SITE_OTHER): Payer: Self-pay

## 2023-11-15 ENCOUNTER — Ambulatory Visit: Payer: Self-pay | Admitting: Physician Assistant

## 2023-11-23 ENCOUNTER — Ambulatory Visit: Admitting: Physician Assistant

## 2023-11-23 ENCOUNTER — Other Ambulatory Visit: Payer: Self-pay | Admitting: Physician Assistant

## 2023-11-23 ENCOUNTER — Ambulatory Visit
Admission: RE | Admit: 2023-11-23 | Discharge: 2023-11-23 | Disposition: A | Discharge status: Home / Self Care | Attending: Physician Assistant | Admitting: Physician Assistant

## 2023-11-23 ENCOUNTER — Ambulatory Visit
Admission: RE | Admit: 2023-11-23 | Discharge: 2023-11-23 | Disposition: A | Discharge status: Home / Self Care | Source: Ambulatory Visit | Attending: Physician Assistant | Admitting: Physician Assistant

## 2023-11-23 VITALS — BP 151/81 | HR 89

## 2023-11-23 DIAGNOSIS — N200 Calculus of kidney: Secondary | ICD-10-CM

## 2023-11-23 DIAGNOSIS — N3946 Mixed incontinence: Secondary | ICD-10-CM

## 2023-11-23 NOTE — Progress Notes (Signed)
 11/23/2023 10:01 AM   Misty Page Jul 03, 1940 [id number]  CC: No chief complaint on file.  HPI: Misty Page is a 83 y.o. female with PMH nephrolithiasis who presents today for annual follow-up.  She is accompanied today by her friend and caregiver, Kate.  Today she reports no flank pain, gross hematuria, or stones passed within the past year.  She has no acute concerns today.  She admits to some occasional mixed urge and stress incontinence.  She wears 1 pull-up daily and it can be damp.  She does not have daytime frequency.  KUB today with stable bilateral renal stones.  PMH: Past Medical History:  Diagnosis Date   Allergy    Anemia    Arthritis    CA of skin 10/15/2014   Calculus of kidney 10/15/2014   Seen in ER 06/16/10.    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   Coronary artery disease    Diverticulitis    Environmental and seasonal allergies    Fibrocystic breast disease (FCBD)    GERD (gastroesophageal reflux disease)    Heart murmur    High cholesterol    History of heart artery stent    History of kidney stones    Hyperlipidemia    Hypertension    Hyperthyroidism    Myocardial infarction (HCC)    Osteomyelitis of left femur (HCC) 11/30/2018   Osteoporosis    PONV (postoperative nausea and vomiting)    Reactive depression 02/25/2019   Thyroid  disease    TIA (transient ischemic attack)     Surgical History: Past Surgical History:  Procedure Laterality Date   ABDOMINAL HYSTERECTOMY  2007   BREAST ENHANCEMENT SURGERY  2004   BREAST IMPLANT REMOVAL     BREAST SURGERY  1984   Fibrocystic Disease   CAROTID ENDARTERECTOMY Left 06/21/2014   Dr. Jama   CAROTID ENDARTERECTOMY Right 08/02/2014   Dr. Jama   CAROTID PTA/STENT INTERVENTION N/A 06/15/2017   Procedure: CAROTID PTA/STENT INTERVENTION;  Surgeon: Jama Cordella MATSU, MD;  Location: [facility] INVASIVE CV LAB;  Service:  Cardiovascular;  Laterality: N/A;   COLONOSCOPY WITH PROPOFOL  N/A 07/14/2015   Procedure: COLONOSCOPY WITH PROPOFOL ;  Surgeon: Deward CINDERELLA Piedmont, MD;  Location: [facility] ENDOSCOPY;  Service: Gastroenterology;  Laterality: N/A;   COLONOSCOPY WITH PROPOFOL  N/A 02/08/2018   Procedure: COLONOSCOPY WITH PROPOFOL ;  Surgeon: Toledo, Ladell POUR, MD;  Location: [facility] ENDOSCOPY;  Service: Gastroenterology;  Laterality: N/A;   CORONARY ANGIOPLASTY WITH STENT PLACEMENT  1999   CYSTOSCOPY WITH STENT PLACEMENT Right 09/20/2022   Procedure: CYSTOSCOPY WITH STENT PLACEMENT;  Surgeon: Penne Knee, MD;  Location: [facility] ORS;  Service: Urology;  Laterality: Right;   CYSTOSCOPY/URETEROSCOPY/HOLMIUM LASER/STENT PLACEMENT Right 10/07/2022   Procedure: CYSTOSCOPY/URETEROSCOPY/HOLMIUM LASER/STENT PLACEMENT;  Surgeon: Penne Knee, MD;  Location: [facility] ORS;  Service: Urology;  Laterality: Right;   ESOPHAGOGASTRODUODENOSCOPY (EGD) WITH PROPOFOL  N/A 02/08/2018   Procedure: ESOPHAGOGASTRODUODENOSCOPY (EGD) WITH PROPOFOL ;  Surgeon: Toledo, Ladell POUR, MD;  Location: [facility] ENDOSCOPY;  Service: Gastroenterology;  Laterality: N/A;   FEMORAL ARTERY STENT  08/2003   HERNIA REPAIR  2008   LEG AMPUTATION AT HIP Left 02/28/2019   MASTECTOMY     RENAL ARTERY STENT  08/2003   TRANSUMBILICAL AUGMENTATION MAMMAPLASTY     VASCULAR SURGERY     VISCERAL ARTERY INTERVENTION N/A 02/13/2020   Procedure: VISCERAL ARTERY INTERVENTION;  Surgeon: Marea Selinda RAMAN, MD;  Location: [facility] INVASIVE CV LAB;  Service: Cardiovascular;  Laterality: N/A;    Home Medications:  Allergies as of 11/23/2023       Reactions   Amoxicillin Anaphylaxis   Cephalosporins Swelling, Rash   Lip swelling and rash on CTX and Vancomycin .   Naproxen Hives, Shortness Of Breath, Nausea And Vomiting, Swelling   Penicillins Shortness Of Breath, Swelling, Rash   Tolerated ceftriaxone  08/2022   Codeine Nausea And Vomiting   Levofloxacin Nausea And Vomiting   Shellfish Allergy Hives,  Diarrhea, Nausea And Vomiting, Swelling   Vancomycin  Swelling, Rash   Occurred while on CTX and Vancomycin . More likely CTX as she has anaphylaxis to PCN, but should monitor closely.        Medication List        Accurate as of November 23, 2023 10:01 AM. If you have any questions, ask your nurse or doctor.          acetaminophen  500 MG tablet Commonly known as: TYLENOL  Take 500-1,000 mg by mouth every 8 (eight) hours as needed.   amLODipine  5 MG tablet Commonly known as: NORVASC  TAKE 1 TABLET BY MOUTH DAILY   aspirin  EC 81 MG tablet Take 81 mg by mouth daily. Swallow whole.   Calcium +D3 600-20 MG-MCG Tabs Generic drug: Calcium  Carb-Cholecalciferol  TAKE 1 TABLET BY MOUTH DAILY   clopidogrel  75 MG tablet Commonly known as: PLAVIX  TAKE 1 TABLET BY MOUTH DAILY   ezetimibe  10 MG tablet Commonly known as: ZETIA  TAKE 1 TABLET BY MOUTH DAILY   famotidine  20 MG tablet Commonly known as: PEPCID  TAKE 1 TABLET BY MOUTH TWICE A DAY   fluticasone  50 MCG/ACT nasal spray Commonly known as: FLONASE  USE 2 SPRAYS EACH NOSTRIL DAILY What changed:  how much to take how to take this when to take this reasons to take this additional instructions   gabapentin  300 MG capsule Commonly known as: NEURONTIN  TAKE ONE CAPSULE BY MOUTH TWICE A DAY   HYDROcodone -acetaminophen  5-325 MG tablet Commonly known as: NORCO/VICODIN Take 1-2 tablets by mouth every 6 (six) hours as needed for moderate pain.   iron  polysaccharides 150 MG capsule Commonly known as: NIFEREX Take 1 capsule (150 mg total) by mouth daily.   losartan  100 MG tablet Commonly known as: COZAAR  TAKE 1 TABLET BY MOUTH DAILY   meclizine  25 MG tablet Commonly known as: ANTIVERT  Take 1 tablet (25 mg total) by mouth 3 (three) times daily as needed for dizziness or nausea.   Mesalamine  800 MG Tbec Take 800 mg by mouth in the morning, at noon, and at bedtime.   methimazole  5 MG tablet Commonly known as: TAPAZOLE  Take 5  mg by mouth. 1 Tablet on Mondays, Wednesdays and Fridays and take 1/2 tablet daily the rest of the week.   PROBIOTIC DAILY PO Take 1 capsule by mouth daily.   Probiotic-Prebiotic 1-250 BILLION-MG Caps TAKE 1 CAPSULE BY MOUTH DAILY   rosuvastatin  20 MG tablet Commonly known as: CRESTOR  TAKE 1/2 TABLET BY MOUTH DAILY   rosuvastatin  10 MG tablet Commonly known as: CRESTOR  TAKE 1 TABLET BY MOUTH DAILY   vitamin C  1000 MG tablet TAKE 1 TABLET BY MOUTH EVERY MORNING        Allergies:  Allergies  Allergen Reactions   Amoxicillin Anaphylaxis   Cephalosporins Swelling and Rash    Lip swelling and rash on CTX and Vancomycin .   Naproxen Hives, Shortness Of Breath, Nausea And Vomiting and Swelling   Penicillins Shortness Of Breath, Swelling and Rash    Tolerated ceftriaxone  08/2022  Codeine Nausea And Vomiting   Levofloxacin Nausea And Vomiting   Shellfish Allergy Hives, Diarrhea, Nausea And Vomiting and Swelling   Vancomycin  Swelling and Rash    Occurred while on CTX and Vancomycin . More likely CTX as she has anaphylaxis to PCN, but should monitor closely.    Family History: Family History  Problem Relation Age of Onset   Hyperlipidemia Mother    Congestive Heart Failure Mother    COPD Mother    Heart disease Mother    Hypertension Mother    Osteoporosis Mother    Pancreatic cancer Father    Arthritis Sister    Alzheimer's disease Sister    Hypertension Sister    Prostate cancer Brother    Heart attack Brother    Stomach cancer Brother    Kidney failure Brother    Leukemia Brother    Emphysema Brother    Pancreatic cancer Brother    Stomach cancer Brother    Breast cancer Paternal Grandmother    Leukemia Paternal Grandfather    Heart attack Maternal Uncle    Stroke Maternal Aunt     Social History:   reports that she quit smoking about 29 years ago. Her smoking use included cigarettes. She has never used smokeless tobacco. She reports that she does not  currently use alcohol. She reports that she does not use drugs.  Physical Exam: There were no vitals taken for this visit.  Constitutional:  Alert and oriented, no acute distress, nontoxic appearing HEENT: [place], AT Cardiovascular: No clubbing, cyanosis, or edema Respiratory: Normal respiratory effort, no increased work of breathing Skin: No rashes, bruises or suspicious lesions Neurologic: Grossly intact, no focal deficits, moving all 4 extremities Psychiatric: Normal mood and affect  Pertinent Imaging: KUB, 11/23/2023: See epic  I personally reviewed the images referenced above and note stable bilateral renal stones.  Assessment & Plan:   1. Nephrolithiasis (Primary) Stable small nonobstructing bilateral renal stones on KUB today.  Will continue to monitor.  She prefers annual follow-up, which is reasonable.  2. Mixed incontinence urge and stress Mixed urge and stress incontinence, minimally bothersome.  Will continue to monitor and consider pharmacotherapy for the urge aspect in the future if things worsen.  Return in about 1 year (around 11/22/2024) for Annual stone visit with KUB prior.  Lucie Hones, [name]  [facility] [place] [address], [address] [place], [name] [address] [phone]

## 2023-11-28 ENCOUNTER — Other Ambulatory Visit: Payer: Self-pay | Admitting: Family Medicine

## 2023-11-29 ENCOUNTER — Other Ambulatory Visit: Payer: Self-pay | Admitting: Family Medicine

## 2023-11-29 NOTE — Telephone Encounter (Signed)
 Requested Prescriptions  Pending Prescriptions Disp Refills   Bacillus Coagulans-Inulin (PROBIOTIC-PREBIOTIC) 1-250 BILLION-MG CAPS [Pharmacy Med Name: PROBIOTIC-PREBIOTIC 1-250 BILLION-M] 90 capsule 1    Sig: TAKE 1 CAPSULE BY MOUTH DAILY     Off-Protocol Failed - 11/29/2023 12:56 PM      Failed - Medication not assigned to a protocol, review manually.      Passed - Valid encounter within last 12 months    Recent Outpatient Visits           2 months ago Encounter for annual physical exam   [facility], [name], MD       Future Appointments             In 3 months [name], [name], MD [facility]   In 12 months [name], [facility] [place]

## 2023-12-26 ENCOUNTER — Other Ambulatory Visit: Payer: Self-pay | Admitting: Family Medicine

## 2023-12-28 ENCOUNTER — Ambulatory Visit: Payer: Medicare HMO

## 2023-12-28 DIAGNOSIS — Z0000 Encounter for general adult medical examination without abnormal findings: Secondary | ICD-10-CM | POA: Diagnosis not present

## 2023-12-28 NOTE — Progress Notes (Signed)
 Subjective:   Misty Page is a 83 y.o. who presents for a Medicare Wellness preventive visit.  As a reminder, Annual Wellness Visits don't include a physical exam, and some assessments may be limited, especially if this visit is performed virtually. We may recommend an in-person follow-up visit with your provider if needed.  Visit Complete: Virtual I connected with  Misty Page on 12/28/23 by a audio enabled telemedicine application and verified that I am speaking with the correct person using two identifiers.  Patient Location: Home  Provider Location: Home Office  I discussed the limitations of evaluation and management by telemedicine. The patient expressed understanding and agreed to proceed.  Vital Signs: Because this visit was a virtual/telehealth visit, some criteria may be missing or patient reported. Any vitals not documented were not able to be obtained and vitals that have been documented are patient reported.  VideoDeclined- This patient declined Librarian, academic. Therefore the visit was completed with audio only.  Persons Participating in Visit: Patient.  AWV Questionnaire: No: Patient Medicare AWV questionnaire was not completed prior to this visit.  Cardiac Risk Factors include: advanced age (>53men, >72 women);hypertension;dyslipidemia;sedentary lifestyle     Objective:    There were no vitals filed for this visit. There is no height or weight on file to calculate BMI.     12/28/2023    8:21 AM 03/07/2023   11:29 AM 12/27/2022    8:43 AM 10/07/2022    9:27 AM 09/30/2022   12:43 PM 09/19/2022   10:00 PM 12/22/2021    9:10 AM  Advanced Directives  Does Patient Have a Medical Advance Directive? No Yes Yes Yes Yes Yes No  Type of Special educational needs teacher of [place];Living will;Out of facility DNR (pink MOST or yellow form) Healthcare Power of [place];Living will Living will;Healthcare Power of [employer] Power of  [place];Living will   Does patient want to make changes to medical advance directive?    No - Patient declined  No - Patient declined   Copy of Healthcare Power of Attorney in Chart?  No - copy requested  No - copy requested  No - copy requested   Would patient like information on creating a medical advance directive? No - Patient declined      No - Patient declined    Current Medications (verified) Outpatient Encounter Medications as of 12/28/2023  Medication Sig   acetaminophen  (TYLENOL ) 500 MG tablet Take 500-1,000 mg by mouth every 8 (eight) hours as needed.   amLODipine  (NORVASC ) 5 MG tablet TAKE 1 TABLET BY MOUTH DAILY   Ascorbic Acid  (VITAMIN C ) 1000 MG tablet TAKE 1 TABLET BY MOUTH EVERY MORNING   aspirin  EC 81 MG tablet Take 81 mg by mouth daily. Swallow whole.   Bacillus Coagulans-Inulin (PROBIOTIC-PREBIOTIC) 1-250 BILLION-MG CAPS TAKE 1 CAPSULE BY MOUTH DAILY   CALCIUM +D3 600-20 MG-MCG TABS TAKE 1 TABLET BY MOUTH DAILY   clopidogrel  (PLAVIX ) 75 MG tablet TAKE 1 TABLET BY MOUTH DAILY   ezetimibe  (ZETIA ) 10 MG tablet TAKE 1 TABLET BY MOUTH DAILY   famotidine  (PEPCID ) 20 MG tablet TAKE 1 TABLET BY MOUTH TWICE A DAY   fluticasone  (FLONASE ) 50 MCG/ACT nasal spray USE 2 SPRAYS EACH NOSTRIL DAILY   gabapentin  (NEURONTIN ) 300 MG capsule TAKE ONE CAPSULE BY MOUTH TWICE A DAY   HYDROcodone -acetaminophen  (NORCO/VICODIN) 5-325 MG tablet Take 1-2 tablets by mouth every 6 (six) hours as needed for moderate pain.   iron  polysaccharides (NIFEREX)  150 MG capsule Take 1 capsule (150 mg total) by mouth daily.   losartan  (COZAAR ) 100 MG tablet TAKE 1 TABLET BY MOUTH DAILY   Mesalamine  800 MG TBEC Take 800 mg by mouth in the morning, at noon, and at bedtime.   methimazole  (TAPAZOLE ) 5 MG tablet Take 5 mg by mouth. 1 Tablet on Mondays, Wednesdays and Fridays and take 1/2 tablet daily the rest of the week.   Probiotic Product (PROBIOTIC DAILY PO) Take 1 capsule by mouth daily.   rosuvastatin  (CRESTOR )  10 MG tablet TAKE 1 TABLET BY MOUTH DAILY   meclizine  (ANTIVERT ) 25 MG tablet Take 1 tablet (25 mg total) by mouth 3 (three) times daily as needed for dizziness or nausea. (Patient not taking: Reported on 12/28/2023)   rosuvastatin  (CRESTOR ) 20 MG tablet TAKE 1/2 TABLET BY MOUTH DAILY   Facility-Administered Encounter Medications as of 12/28/2023  Medication   sulfamethoxazole -trimethoprim  (BACTRIM  DS) 800-160 MG per tablet 1 tablet    Allergies (verified) Amoxicillin, Cephalosporins, Naproxen, Penicillins, Codeine, Levofloxacin, Shellfish allergy, and Vancomycin    History: Past Medical History:  Diagnosis Date   Allergy    Anemia    Arthritis    CA of skin 10/15/2014   Calculus of kidney 10/15/2014   Seen in ER 06/16/10.    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   Coronary artery disease    Diverticulitis    Environmental and seasonal allergies    Fibrocystic breast disease (FCBD)    GERD (gastroesophageal reflux disease)    Heart murmur    High cholesterol    History of heart artery stent    History of kidney stones    Hyperlipidemia    Hypertension    Hyperthyroidism    Myocardial infarction (HCC)    Osteomyelitis of left femur (HCC) 11/30/2018   Osteoporosis    PONV (postoperative nausea and vomiting)    Reactive depression 02/25/2019   Thyroid  disease    TIA (transient ischemic attack)    Past Surgical History:  Procedure Laterality Date   ABDOMINAL HYSTERECTOMY  2007   BREAST ENHANCEMENT SURGERY  2004   BREAST IMPLANT REMOVAL     BREAST SURGERY  1984   Fibrocystic Disease   CAROTID ENDARTERECTOMY Left 06/21/2014   Dr. Jama   CAROTID ENDARTERECTOMY Right 08/02/2014   Dr. Jama   CAROTID PTA/STENT INTERVENTION N/A 06/15/2017   Procedure: CAROTID PTA/STENT INTERVENTION;  Surgeon: Jama Cordella MATSU, MD;  Location: [facility];  Service: Cardiovascular;  Laterality: N/A;    COLONOSCOPY WITH PROPOFOL  N/A 07/14/2015   Procedure: COLONOSCOPY WITH PROPOFOL ;  Surgeon: Deward CINDERELLA Piedmont, MD;  Location: [facility] ENDOSCOPY;  Service: Gastroenterology;  Laterality: N/A;   COLONOSCOPY WITH PROPOFOL  N/A 02/08/2018   Procedure: COLONOSCOPY WITH PROPOFOL ;  Surgeon: Toledo, Ladell POUR, MD;  Location: [facility] ENDOSCOPY;  Service: Gastroenterology;  Laterality: N/A;   CORONARY ANGIOPLASTY WITH STENT PLACEMENT  1999   CYSTOSCOPY WITH STENT PLACEMENT Right 09/20/2022   Procedure: CYSTOSCOPY WITH STENT PLACEMENT;  Surgeon: Penne Knee, MD;  Location: [facility] ORS;  Service: Urology;  Laterality: Right;   CYSTOSCOPY/URETEROSCOPY/HOLMIUM LASER/STENT PLACEMENT Right 10/07/2022   Procedure: CYSTOSCOPY/URETEROSCOPY/HOLMIUM LASER/STENT PLACEMENT;  Surgeon: Penne Knee, MD;  Location: [facility] ORS;  Service: Urology;  Laterality: Right;   ESOPHAGOGASTRODUODENOSCOPY (EGD) WITH PROPOFOL  N/A 02/08/2018   Procedure: ESOPHAGOGASTRODUODENOSCOPY (EGD) WITH PROPOFOL ;  Surgeon: Toledo, Ladell POUR, MD;  Location: [facility] ENDOSCOPY;  Service: Gastroenterology;  Laterality: N/A;  FEMORAL ARTERY STENT  08/2003   HERNIA REPAIR  2008   LEG AMPUTATION AT HIP Left 02/28/2019   MASTECTOMY     RENAL ARTERY STENT  08/2003   TRANSUMBILICAL AUGMENTATION MAMMAPLASTY     VASCULAR SURGERY     VISCERAL ARTERY INTERVENTION N/A 02/13/2020   Procedure: VISCERAL ARTERY INTERVENTION;  Surgeon: Marea Selinda RAMAN, MD;  Location: [facility];  Service: Cardiovascular;  Laterality: N/A;   Family History  Problem Relation Age of Onset   Hyperlipidemia Mother    Congestive Heart Failure Mother    COPD Mother    Heart disease Mother    Hypertension Mother    Osteoporosis Mother    Pancreatic cancer Father    Arthritis Sister    Alzheimer's disease Sister    Hypertension Sister    Prostate cancer Brother    Heart attack Brother    Stomach cancer Brother    Kidney failure Brother    Leukemia Brother    Emphysema Brother    Pancreatic  cancer Brother    Stomach cancer Brother    Breast cancer Paternal Grandmother    Leukemia Paternal Grandfather    Heart attack Maternal Uncle    Stroke Maternal Aunt    Social History   Socioeconomic History   Marital status: Widowed    Spouse name: Madeleine   Number of children: 1   Years of education: Not on file   Highest education level: 12th grade  Occupational History   Occupation: Producer, television/film/video    Comment: retired  Tobacco Use   Smoking status: Former    Current packs/day: 0.00    Types: Cigarettes    Quit date: 05/30/1994    Years since quitting: 29.6   Smokeless tobacco: Never  Vaping Use   Vaping status: Never Used  Substance and Sexual Activity   Alcohol use: Not Currently    Comment: rare- 1 glass of wine EOW   Drug use: No   Sexual activity: Not Currently    Birth control/protection: None  Other Topics Concern   Not on file  Social History Narrative   Lives alone; gets assistance 3x/week-hired help   Social Drivers of Health   Financial Resource Strain: Low Risk  (12/28/2023)   Overall Financial Resource Strain (CARDIA)    Difficulty of Paying Living Expenses: Not hard at all  Food Insecurity: No Food Insecurity (12/28/2023)   Hunger Vital Sign    Worried About Running Out of Food in the Last Year: Never true    Ran Out of Food in the Last Year: Never true  Transportation Needs: No Transportation Needs (12/28/2023)   PRAPARE - Administrator, Civil Service (Medical): No    Lack of Transportation (Non-Medical): No  Physical Activity: Inactive (12/28/2023)   Exercise Vital Sign    Days of Exercise per Week: 0 days    Minutes of Exercise per Session: 0 min  Stress: No Stress Concern Present (12/28/2023)   [employer] of Occupational Health - Occupational Stress Questionnaire    Feeling of Stress: Not at all  Social Connections: Socially Isolated (12/28/2023)   Social Connection and Isolation Panel    Frequency of Communication with Friends  and Family: More than three times a week    Frequency of Social Gatherings with Friends and Family: Never    Attends Religious Services: Never    Database administrator or Organizations: No    Attends Banker Meetings: Never  Marital Status: Widowed    Tobacco Counseling Counseling given: Not Answered    Clinical Intake:  Pre-visit preparation completed: Yes  Pain : No/denies pain     BMI - recorded: 24.8 Nutritional Status: BMI of 19-24  Normal Nutritional Risks: None Diabetes: No  Lab Results  Component Value Date   HGBA1C 5.7 (H) 09/09/2023     How often do you need to have someone help you when you read instructions, pamphlets, or other written materials from your doctor or pharmacy?: 1 - Never  Interpreter Needed?: No  Information entered by :: JHONNIE DAS, LPN   Activities of Daily Living     12/28/2023    8:22 AM  In your present state of health, do you have any difficulty performing the following activities:  Hearing? 0  Vision? 0  Difficulty concentrating or making decisions? 0  Walking or climbing stairs? 1  Comment W/C BOUND  Dressing or bathing? 0  Doing errands, shopping? 1  Preparing Food and eating ? N  Using the Toilet? N  In the past six months, have you accidently leaked urine? Y  Do you have problems with loss of bowel control? N  Managing your Medications? N  Managing your Finances? N  Housekeeping or managing your Housekeeping? Y    Patient Care Team: [name], MD as PCP - General (Family Medicine) Jama, Cordella MATSU, MD as Consulting Physician (Vascular Surgery) Hester Wolm PARAS, MD as Consulting Physician (Cardiology) Cherilyn Debby CROME, MD as Consulting Physician (Internal Medicine) Poggi, Norleen PARAS, MD as Consulting Physician (Surgery) Mevelyn Romero KATHEE DEVONNA as Physician Assistant (Gastroenterology) Anselm Chu, MD as Referring Physician (Hematology and Oncology) Tanda Hamilton, MD as Referring  Physician (Orthopedic Surgery) Dasher, Alm LABOR, MD (Dermatology) Agrawal, Kavita, MD as Consulting Physician (Oncology)  I have updated your Care Teams any recent Medical Services you may have received from other providers in the past year.     Assessment:   This is a routine wellness examination for Ja.  Hearing/Vision screen Hearing Screening - Comments:: NO AIDS Vision Screening - Comments:: WEARS GLASSES ALL DAY- MY EYE DOCTOR   Goals Addressed             This Visit's Progress    Cut out extra servings         Depression Screen     12/28/2023    8:19 AM 03/07/2023   11:49 AM 12/27/2022    8:39 AM 12/22/2021    9:08 AM 08/21/2021   10:02 AM 05/04/2021   10:57 AM 04/17/2021   10:56 AM  PHQ 2/9 Scores  PHQ - 2 Score 0 0 2 2 2 1 3   PHQ- 9 Score 0  4 4 4 3 4     Fall Risk     12/28/2023    8:22 AM 12/27/2022    8:26 AM 07/23/2022    9:46 AM 12/22/2021    9:11 AM 08/21/2021   10:03 AM  Fall Risk   Falls in the past year? 1 0 0 0 0  Number falls in past yr: 0 0 0 0 0  Injury with Fall? 0 0 0 0 0  Risk for fall due to : Impaired mobility No Fall Risks  No Fall Risks No Fall Risks  Follow up Falls evaluation completed;Falls prevention discussed Education provided;Falls prevention discussed  Falls evaluation completed  Falls evaluation completed      Data saved with a previous flowsheet row definition    MEDICARE RISK  AT HOME:  Medicare Risk at Home Any stairs in or around the home?: No If so, are there any without handrails?: No Home free of loose throw rugs in walkways, pet beds, electrical cords, etc?: Yes Adequate lighting in your home to reduce risk of falls?: Yes Life alert?: Yes (WATCH THAT ALERTS IF FALLS) Use of a cane, walker or w/c?: Yes (W/C MOSTLY- USES WALKER IN BATHROOM) Grab bars in the bathroom?: Yes Shower chair or bench in shower?: Yes Elevated toilet seat or a handicapped toilet?: Yes  TIMED UP AND GO:  Was the test performed?   No  Cognitive Function: 6CIT completed    05/09/2019    2:33 PM  MMSE - Mini Mental State Exam  Orientation to time 3  Orientation to Place 5  Registration 3  Attention/ Calculation 3  Recall 2  Language- name 2 objects 2  Language- repeat 1  Language- follow 3 step command 3  Language- read & follow direction 1  Write a sentence 1  Copy design 0  Total score 24        12/28/2023    8:25 AM 12/27/2022    8:45 AM 12/22/2021    9:13 AM 10/24/2020   10:24 AM  6CIT Screen  What Year? 0 points 0 points 0 points 0 points  What month? 0 points 0 points 0 points 0 points  What time? 0 points 0 points 0 points 0 points  Count back from 20 0 points 0 points 0 points 0 points  Months in reverse 0 points 0 points 0 points 0 points  Repeat phrase 0 points 0 points 2 points 2 points  Total Score 0 points 0 points 2 points 2 points    Immunizations Immunization History  Administered Date(s) Administered   Fluad Quad(high Dose 65+) 02/14/2020, 04/17/2021   Fluad Trivalent(High Dose 65+) 01/28/2023   Influenza Split 04/19/2011   Influenza, High Dose Seasonal PF 02/18/2014, 03/12/2016, 03/07/2019   Influenza,inj,Quad PF,6+ Mos 02/10/2013, 03/07/2017, 03/24/2018   Influenza,inj,quad, With Preservative 03/07/2019   Influenza-Unspecified 02/10/2013, 03/01/2015, 03/31/2022   PFIZER Comirnaty(Gray Top)Covid-19 Tri-Sucrose Vaccine 06/25/2019, 07/16/2019, 04/04/2020   Pfizer Covid-19 Vaccine Bivalent Booster 35yrs & up 11/10/2020, 04/30/2021   Pneumococcal Conjugate-13 05/18/2013   Pneumococcal Polysaccharide-23 03/02/2004, 08/15/2017   Td 02/08/2005, 11/04/2017   Zoster, Live 01/29/2006    Screening Tests Health Maintenance  Topic Date Due   Zoster Vaccines- Shingrix (1 of 2) [birth year]24/1959   COVID-19 Vaccine (6 - 2024-25 season) 01/30/2023   INFLUENZA VACCINE  12/30/2023   Medicare Annual Wellness (AWV)  12/27/2024   DTaP/Tdap/Td (3 - Tdap) 11/05/2027   Pneumococcal Vaccine: 50+ Years   Completed   DEXA SCAN  Completed   Hepatitis B Vaccines  Aged Out   HPV VACCINES  Aged Out   Meningococcal B Vaccine  Aged Out    Health Maintenance  Health Maintenance Due  Topic Date Due   Zoster Vaccines- Shingrix (1 of 2) 12[birth year]24/1959   COVID-19 Vaccine (6 - 2024-25 season) 01/30/2023   Health Maintenance Items Addressed: DECLINES REFERRAL FOR BDS; AGED OUT OF MAMMOGRAM & COLONOSCOPY; DECLINES SHINGRIX, COVID; UP TO DATE ON TDAP  Additional Screening:  Vision Screening: Recommended annual ophthalmology exams for early detection of glaucoma and other disorders of the eye. Would you like a referral to an eye doctor? No    Dental Screening: Recommended annual dental exams for proper oral hygiene  Community Resource Referral / Chronic Care Management: CRR required this visit?  No  CCM required this visit?  No   Plan:    I have personally reviewed and noted the following in the patient's chart:   Medical and social history Use of alcohol, tobacco or illicit drugs  Current medications and supplements including opioid prescriptions. Patient is currently taking opioid prescriptions. Information provided to patient regarding non-opioid alternatives. Patient advised to discuss non-opioid treatment plan with their provider. Functional ability and status Nutritional status Physical activity Advanced directives List of other physicians Hospitalizations, surgeries, and ER visits in previous 12 months Vitals Screenings to include cognitive, depression, and falls Referrals and appointments  In addition, I have reviewed and discussed with patient certain preventive protocols, quality metrics, and best practice recommendations. A written personalized care plan for preventive services as well as general preventive health recommendations were provided to patient.   Jhonnie GORMAN Das, LPN   [id number]/[id number]   After Visit Summary: (MyChart) Due to this being a telephonic visit, the after  visit summary with patients personalized plan was offered to patient via MyChart   Notes: Nothing significant to report at this time.

## 2023-12-28 NOTE — Patient Instructions (Addendum)
 Ms. Greff , Thank you for taking time out of your busy schedule to complete your Annual Wellness Visit with me. I enjoyed our conversation and look forward to speaking with you again next year. I, as well as your care team,  appreciate your ongoing commitment to your health goals. Please review the following plan we discussed and let me know if I can assist you in the future.   Follow up Visits: Next Medicare AWV with our clinical staff:   01/01/25 @ 3:50 PM BY PHONE Have you seen your provider in the last 6 months (3 months if uncontrolled diabetes)? Yes   Clinician Recommendations:  Aim for 30 minutes of exercise or brisk walking, 6-8 glasses of water, and 5 servings of fruits and vegetables each day. TAKE CARE!      This is a list of the screening recommended for you and due dates:  Health Maintenance  Topic Date Due   Zoster (Shingles) Vaccine (1 of 2) 12/24/1959   COVID-19 Vaccine (6 - 2024-25 season) 01/30/2023   Flu Shot  12/30/2023   Medicare Annual Wellness Visit  12/27/2024   DTaP/Tdap/Td vaccine (3 - Tdap) 11/05/2027   Pneumococcal Vaccine for age over 51  Completed   DEXA scan (bone density measurement)  Completed   Hepatitis B Vaccine  Aged Out   HPV Vaccine  Aged Out   Meningitis B Vaccine  Aged Out    Advanced directives: (ACP Link)Information on Advanced Care Planning can be found at Animal nutritionist Advance Health Care Directives Advance Health Care Directives. [email or web address]  Advance Care Planning is important because it:  [x]  Makes sure you receive the medical care that is consistent with your values, goals, and preferences  [x]  It provides guidance to your family and loved ones and reduces their decisional burden about whether or not they are making the right decisions based on your wishes.  Follow the link provided in your after visit summary or read over the paperwork we have mailed to you to help you started getting your Advance Directives in place.  If you need assistance in completing these, please reach out to us  so that we can help you!

## 2024-01-21 DIAGNOSIS — I7090 Unspecified atherosclerosis: Secondary | ICD-10-CM | POA: Diagnosis not present

## 2024-01-21 DIAGNOSIS — I10 Essential (primary) hypertension: Secondary | ICD-10-CM | POA: Diagnosis not present

## 2024-01-21 DIAGNOSIS — M47817 Spondylosis without myelopathy or radiculopathy, lumbosacral region: Secondary | ICD-10-CM | POA: Diagnosis not present

## 2024-01-21 DIAGNOSIS — R0902 Hypoxemia: Secondary | ICD-10-CM | POA: Diagnosis not present

## 2024-01-21 DIAGNOSIS — S3282XA Multiple fractures of pelvis without disruption of pelvic ring, initial encounter for closed fracture: Secondary | ICD-10-CM | POA: Diagnosis not present

## 2024-01-21 DIAGNOSIS — M85852 Other specified disorders of bone density and structure, left thigh: Secondary | ICD-10-CM | POA: Diagnosis not present

## 2024-01-22 DIAGNOSIS — M160 Bilateral primary osteoarthritis of hip: Secondary | ICD-10-CM | POA: Diagnosis not present

## 2024-01-22 DIAGNOSIS — R0602 Shortness of breath: Secondary | ICD-10-CM | POA: Diagnosis not present

## 2024-01-22 DIAGNOSIS — Z881 Allergy status to other antibiotic agents status: Secondary | ICD-10-CM | POA: Diagnosis not present

## 2024-01-22 DIAGNOSIS — M25551 Pain in right hip: Secondary | ICD-10-CM | POA: Diagnosis not present

## 2024-01-22 DIAGNOSIS — Z7902 Long term (current) use of antithrombotics/antiplatelets: Secondary | ICD-10-CM | POA: Diagnosis not present

## 2024-01-22 DIAGNOSIS — M8580 Other specified disorders of bone density and structure, unspecified site: Secondary | ICD-10-CM | POA: Diagnosis not present

## 2024-01-22 DIAGNOSIS — W19XXXD Unspecified fall, subsequent encounter: Secondary | ICD-10-CM | POA: Diagnosis not present

## 2024-01-22 DIAGNOSIS — Z89612 Acquired absence of left leg above knee: Secondary | ICD-10-CM | POA: Diagnosis not present

## 2024-01-22 DIAGNOSIS — M800B1A Age-related osteoporosis with current pathological fracture, right pelvis, initial encounter for fracture: Secondary | ICD-10-CM | POA: Diagnosis not present

## 2024-01-22 DIAGNOSIS — M25561 Pain in right knee: Secondary | ICD-10-CM | POA: Diagnosis not present

## 2024-01-22 DIAGNOSIS — Y92009 Unspecified place in unspecified non-institutional (private) residence as the place of occurrence of the external cause: Secondary | ICD-10-CM | POA: Diagnosis not present

## 2024-01-22 DIAGNOSIS — E785 Hyperlipidemia, unspecified: Secondary | ICD-10-CM | POA: Diagnosis not present

## 2024-01-22 DIAGNOSIS — S32599A Other specified fracture of unspecified pubis, initial encounter for closed fracture: Secondary | ICD-10-CM | POA: Diagnosis not present

## 2024-01-22 DIAGNOSIS — M25552 Pain in left hip: Secondary | ICD-10-CM | POA: Diagnosis not present

## 2024-01-22 DIAGNOSIS — Z7982 Long term (current) use of aspirin: Secondary | ICD-10-CM | POA: Diagnosis not present

## 2024-01-22 DIAGNOSIS — W050XXA Fall from non-moving wheelchair, initial encounter: Secondary | ICD-10-CM | POA: Diagnosis not present

## 2024-01-22 DIAGNOSIS — S32502D Unspecified fracture of left pubis, subsequent encounter for fracture with routine healing: Secondary | ICD-10-CM | POA: Diagnosis not present

## 2024-01-22 DIAGNOSIS — K5100 Ulcerative (chronic) pancolitis without complications: Secondary | ICD-10-CM | POA: Diagnosis not present

## 2024-01-22 DIAGNOSIS — Z66 Do not resuscitate: Secondary | ICD-10-CM | POA: Diagnosis not present

## 2024-01-22 DIAGNOSIS — Z888 Allergy status to other drugs, medicaments and biological substances status: Secondary | ICD-10-CM | POA: Diagnosis not present

## 2024-01-22 DIAGNOSIS — S32512A Fracture of superior rim of left pubis, initial encounter for closed fracture: Secondary | ICD-10-CM | POA: Diagnosis not present

## 2024-01-22 DIAGNOSIS — M6281 Muscle weakness (generalized): Secondary | ICD-10-CM | POA: Diagnosis not present

## 2024-01-22 DIAGNOSIS — R5381 Other malaise: Secondary | ICD-10-CM | POA: Diagnosis not present

## 2024-01-22 DIAGNOSIS — I2510 Atherosclerotic heart disease of native coronary artery without angina pectoris: Secondary | ICD-10-CM | POA: Diagnosis not present

## 2024-01-22 DIAGNOSIS — S329XXA Fracture of unspecified parts of lumbosacral spine and pelvis, initial encounter for closed fracture: Secondary | ICD-10-CM | POA: Diagnosis not present

## 2024-01-22 DIAGNOSIS — Z79899 Other long term (current) drug therapy: Secondary | ICD-10-CM | POA: Diagnosis not present

## 2024-01-22 DIAGNOSIS — D649 Anemia, unspecified: Secondary | ICD-10-CM | POA: Diagnosis not present

## 2024-01-22 DIAGNOSIS — I119 Hypertensive heart disease without heart failure: Secondary | ICD-10-CM | POA: Diagnosis not present

## 2024-01-22 DIAGNOSIS — R279 Unspecified lack of coordination: Secondary | ICD-10-CM | POA: Diagnosis not present

## 2024-01-22 DIAGNOSIS — K219 Gastro-esophageal reflux disease without esophagitis: Secondary | ICD-10-CM | POA: Diagnosis not present

## 2024-01-22 DIAGNOSIS — Z7901 Long term (current) use of anticoagulants: Secondary | ICD-10-CM | POA: Diagnosis not present

## 2024-01-22 DIAGNOSIS — M792 Neuralgia and neuritis, unspecified: Secondary | ICD-10-CM | POA: Diagnosis not present

## 2024-01-22 DIAGNOSIS — S32592A Other specified fracture of left pubis, initial encounter for closed fracture: Secondary | ICD-10-CM | POA: Diagnosis not present

## 2024-01-22 DIAGNOSIS — S3289XD Fracture of other parts of pelvis, subsequent encounter for fracture with routine healing: Secondary | ICD-10-CM | POA: Diagnosis not present

## 2024-01-22 DIAGNOSIS — S3210XA Unspecified fracture of sacrum, initial encounter for closed fracture: Secondary | ICD-10-CM | POA: Diagnosis not present

## 2024-01-22 DIAGNOSIS — I252 Old myocardial infarction: Secondary | ICD-10-CM | POA: Diagnosis not present

## 2024-01-22 DIAGNOSIS — Z880 Allergy status to penicillin: Secondary | ICD-10-CM | POA: Diagnosis not present

## 2024-01-22 DIAGNOSIS — W19XXXA Unspecified fall, initial encounter: Secondary | ICD-10-CM | POA: Diagnosis not present

## 2024-01-22 DIAGNOSIS — S3282XA Multiple fractures of pelvis without disruption of pelvic ring, initial encounter for closed fracture: Secondary | ICD-10-CM | POA: Diagnosis not present

## 2024-01-22 DIAGNOSIS — S32302A Unspecified fracture of left ilium, initial encounter for closed fracture: Secondary | ICD-10-CM | POA: Diagnosis not present

## 2024-01-22 DIAGNOSIS — Z85831 Personal history of malignant neoplasm of soft tissue: Secondary | ICD-10-CM | POA: Diagnosis not present

## 2024-01-22 DIAGNOSIS — Z885 Allergy status to narcotic agent status: Secondary | ICD-10-CM | POA: Diagnosis not present

## 2024-01-22 DIAGNOSIS — K5190 Ulcerative colitis, unspecified, without complications: Secondary | ICD-10-CM | POA: Diagnosis not present

## 2024-01-22 DIAGNOSIS — D72829 Elevated white blood cell count, unspecified: Secondary | ICD-10-CM | POA: Diagnosis not present

## 2024-01-22 DIAGNOSIS — Z91013 Allergy to seafood: Secondary | ICD-10-CM | POA: Diagnosis not present

## 2024-01-22 DIAGNOSIS — Z8679 Personal history of other diseases of the circulatory system: Secondary | ICD-10-CM | POA: Diagnosis not present

## 2024-01-22 DIAGNOSIS — I10 Essential (primary) hypertension: Secondary | ICD-10-CM | POA: Diagnosis not present

## 2024-01-23 ENCOUNTER — Other Ambulatory Visit: Payer: Self-pay | Admitting: Family Medicine

## 2024-01-26 NOTE — Care Plan (Signed)
 Shift Summary Fall reduction program and bed alarm were maintained, and patient remained in bed during hourly checks.   Bruising was present on both forearms, and frequent repositioning and pressure reduction techniques were used to support skin integrity.   Pain was consistently assessed and remained at 0, with comfort interventions provided.   Moderate assistance was required for mobility and hygiene, with patient able to participate in activities.   Patient remained safe and comfortable throughout the shift.   Absence of Fall and Fall-Related Injury: Fall reduction program and bed alarm were consistently maintained throughout the shift, with hourly visual checks confirming patient remained in bed and side rails up; no new falls or injuries documented.   Skin Health and Integrity: Bruising on left and right forearm was noted at the start of the shift, and frequent repositioning and pressure reduction techniques were provided to support skin integrity.   Optimal Pain Control and Function: Pain remained at 0 throughout the shift, with regular pain assessments and repositioning interventions performed.   Improved Ability to Complete Activities of Daily Living: Moderate assistance was required for mobility and hygiene, with patient able to participate in 50-74% of activities.

## 2024-01-30 DIAGNOSIS — Z1152 Encounter for screening for COVID-19: Secondary | ICD-10-CM | POA: Diagnosis not present

## 2024-01-30 DIAGNOSIS — E7800 Pure hypercholesterolemia, unspecified: Secondary | ICD-10-CM | POA: Diagnosis present

## 2024-01-30 DIAGNOSIS — R279 Unspecified lack of coordination: Secondary | ICD-10-CM | POA: Diagnosis not present

## 2024-01-30 DIAGNOSIS — Z89612 Acquired absence of left leg above knee: Secondary | ICD-10-CM | POA: Diagnosis not present

## 2024-01-30 DIAGNOSIS — J90 Pleural effusion, not elsewhere classified: Secondary | ICD-10-CM | POA: Diagnosis present

## 2024-01-30 DIAGNOSIS — Z8249 Family history of ischemic heart disease and other diseases of the circulatory system: Secondary | ICD-10-CM | POA: Diagnosis not present

## 2024-01-30 DIAGNOSIS — I2489 Other forms of acute ischemic heart disease: Secondary | ICD-10-CM | POA: Diagnosis present

## 2024-01-30 DIAGNOSIS — M25552 Pain in left hip: Secondary | ICD-10-CM | POA: Diagnosis not present

## 2024-01-30 DIAGNOSIS — Z79899 Other long term (current) drug therapy: Secondary | ICD-10-CM | POA: Diagnosis not present

## 2024-01-30 DIAGNOSIS — R7989 Other specified abnormal findings of blood chemistry: Secondary | ICD-10-CM | POA: Diagnosis not present

## 2024-01-30 DIAGNOSIS — K219 Gastro-esophageal reflux disease without esophagitis: Secondary | ICD-10-CM | POA: Diagnosis not present

## 2024-01-30 DIAGNOSIS — Z7902 Long term (current) use of antithrombotics/antiplatelets: Secondary | ICD-10-CM | POA: Diagnosis not present

## 2024-01-30 DIAGNOSIS — R0602 Shortness of breath: Secondary | ICD-10-CM | POA: Diagnosis not present

## 2024-01-30 DIAGNOSIS — S32502D Unspecified fracture of left pubis, subsequent encounter for fracture with routine healing: Secondary | ICD-10-CM | POA: Diagnosis not present

## 2024-01-30 DIAGNOSIS — E785 Hyperlipidemia, unspecified: Secondary | ICD-10-CM | POA: Diagnosis not present

## 2024-01-30 DIAGNOSIS — Z87891 Personal history of nicotine dependence: Secondary | ICD-10-CM | POA: Diagnosis not present

## 2024-01-30 DIAGNOSIS — M792 Neuralgia and neuritis, unspecified: Secondary | ICD-10-CM | POA: Diagnosis not present

## 2024-01-30 DIAGNOSIS — I2699 Other pulmonary embolism without acute cor pulmonale: Secondary | ICD-10-CM | POA: Diagnosis not present

## 2024-01-30 DIAGNOSIS — S329XXA Fracture of unspecified parts of lumbosacral spine and pelvis, initial encounter for closed fracture: Secondary | ICD-10-CM | POA: Diagnosis not present

## 2024-01-30 DIAGNOSIS — I2609 Other pulmonary embolism with acute cor pulmonale: Secondary | ICD-10-CM | POA: Diagnosis present

## 2024-01-30 DIAGNOSIS — Z886 Allergy status to analgesic agent status: Secondary | ICD-10-CM | POA: Diagnosis not present

## 2024-01-30 DIAGNOSIS — Z91013 Allergy to seafood: Secondary | ICD-10-CM | POA: Diagnosis not present

## 2024-01-30 DIAGNOSIS — I2721 Secondary pulmonary arterial hypertension: Secondary | ICD-10-CM | POA: Diagnosis present

## 2024-01-30 DIAGNOSIS — E782 Mixed hyperlipidemia: Secondary | ICD-10-CM | POA: Diagnosis not present

## 2024-01-30 DIAGNOSIS — K5100 Ulcerative (chronic) pancolitis without complications: Secondary | ICD-10-CM | POA: Diagnosis not present

## 2024-01-30 DIAGNOSIS — W19XXXD Unspecified fall, subsequent encounter: Secondary | ICD-10-CM | POA: Diagnosis not present

## 2024-01-30 DIAGNOSIS — Z955 Presence of coronary angioplasty implant and graft: Secondary | ICD-10-CM | POA: Diagnosis not present

## 2024-01-30 DIAGNOSIS — K5190 Ulcerative colitis, unspecified, without complications: Secondary | ICD-10-CM | POA: Diagnosis present

## 2024-01-30 DIAGNOSIS — I2510 Atherosclerotic heart disease of native coronary artery without angina pectoris: Secondary | ICD-10-CM | POA: Diagnosis not present

## 2024-01-30 DIAGNOSIS — Z880 Allergy status to penicillin: Secondary | ICD-10-CM | POA: Diagnosis not present

## 2024-01-30 DIAGNOSIS — J9601 Acute respiratory failure with hypoxia: Secondary | ICD-10-CM | POA: Diagnosis present

## 2024-01-30 DIAGNOSIS — E0590 Thyrotoxicosis, unspecified without thyrotoxic crisis or storm: Secondary | ICD-10-CM | POA: Diagnosis present

## 2024-01-30 DIAGNOSIS — Z66 Do not resuscitate: Secondary | ICD-10-CM | POA: Diagnosis present

## 2024-01-30 DIAGNOSIS — S3289XD Fracture of other parts of pelvis, subsequent encounter for fracture with routine healing: Secondary | ICD-10-CM | POA: Diagnosis not present

## 2024-01-30 DIAGNOSIS — I252 Old myocardial infarction: Secondary | ICD-10-CM | POA: Diagnosis not present

## 2024-01-30 DIAGNOSIS — R918 Other nonspecific abnormal finding of lung field: Secondary | ICD-10-CM | POA: Diagnosis not present

## 2024-01-30 DIAGNOSIS — M6281 Muscle weakness (generalized): Secondary | ICD-10-CM | POA: Diagnosis not present

## 2024-01-30 DIAGNOSIS — Z7982 Long term (current) use of aspirin: Secondary | ICD-10-CM | POA: Diagnosis not present

## 2024-01-30 DIAGNOSIS — R5381 Other malaise: Secondary | ICD-10-CM | POA: Diagnosis not present

## 2024-01-30 DIAGNOSIS — I350 Nonrheumatic aortic (valve) stenosis: Secondary | ICD-10-CM | POA: Diagnosis present

## 2024-01-30 DIAGNOSIS — R011 Cardiac murmur, unspecified: Secondary | ICD-10-CM | POA: Diagnosis not present

## 2024-01-30 DIAGNOSIS — I10 Essential (primary) hypertension: Secondary | ICD-10-CM | POA: Diagnosis not present

## 2024-01-30 DIAGNOSIS — Z8679 Personal history of other diseases of the circulatory system: Secondary | ICD-10-CM | POA: Diagnosis not present

## 2024-01-30 DIAGNOSIS — Z7901 Long term (current) use of anticoagulants: Secondary | ICD-10-CM | POA: Diagnosis not present

## 2024-01-30 DIAGNOSIS — W19XXXA Unspecified fall, initial encounter: Secondary | ICD-10-CM | POA: Diagnosis not present

## 2024-01-30 NOTE — Discharge Summary (Signed)
 ------------------------------------------------------------------------------- Attestation signed by Page Misty Murray, MD at 01/30/24 [phone] I saw and evaluated the patient on 01/30/2024, participating in the key portions of the service. I reviewed the resident's note. I agree with the resident's findings and plan. Misty MARLA Exie, MD   Patient is doing well from pain control and progressing with therapy. Discharging to SNF for subacute rehab in the setting of multiple pelvic fractures. Outpatient orthopedics follow-up scheduled for 9/17. Greater than 30 minutes spent in the discharge of this patient.  -------------------------------------------------------------------------------   Physician Discharge Summary [facility] [address] [name] [address] Dept: [phone] Loc: [phone]   Identifying Information:  Misty Page [birth year] [id number]  Primary Care Physician: Misty Inocente PARAS, MD   Code Status: DNR and DNI  Admit Date: 01/21/2024  Discharge Date: 01/30/2024   Discharge To: Skilled nursing facility  Discharge Service: [facility] - General Medicine Floor Team ([facility])   Discharge Attending Physician: Misty Murray Exie, MD  Discharge Diagnoses:   Principal Problem:   Ground-level fall (POA: Unknown) Active Problems:   HTN (hypertension) (POA: Unknown)   HLD (hyperlipidemia) (POA: Unknown)   Ulcerative colitis     (POA: Unknown)   Left hip pain (POA: Unknown) Resolved Problems:   * No resolved hospital problems. [facility] Course:  [ ]  Orthopedics appointment 9/17 for follow-up [ ]  Continue aspirin  for DVT ppx for 4 weeks from injury (8/23)  Misty Page is an 83 year old female with a history of hypertension, hyperlipidemia, ulcerative colitis, prior MI with PCI, CAD, and left above-knee amputation for sarcoma, who was admitted following a ground-level fall resulting in multiple left pelvic fractures and associated functional  decline.  Problem-Oriented Hospitalization Summary  Left Pelvic Fractures and Functional Decline She sustained a ground-level mechanical fall from her wheelchair at home, resulting in pain localized to the left hip and pelvis. Imaging revealed undisplaced fracture of the left sacral ala, minimally displaced fractures of the left pubic parasymphysis, high superior and inferior pubic ramus, and a nondisplaced fracture of the left anterior superior iliac spine. Orthopedic consultation recommended non-operative management with weight bearing as tolerated on the residual limb and right leg, DVT prophylaxis, and outpatient follow-up. She received multimodal pain control including scheduled acetaminophen , gabapentin , PRN oxycodone , and lidocaine  patches, with pain generally well controlled and minimal opioid use due to concerns for constipation. Physical and occupational therapy documented significant deficits in strength, endurance, balance, and mobility, with moderate to maximum assistance required for transfers and ADLs initially, progressing to minimal assistance and supervision for some tasks by discharge. She remained wheelchair-dependent and was not at her prior level of function at discharge, with ongoing need for skilled therapy and assistance. Discharge planning focused on transition to a skilled nursing facility for subacute rehabilitation, with barriers including decreased caregiver support and persistent functional deficits.  Ground-Level Fall The fall was mechanical, occurring as she slipped out of her wheelchair while at home, with no preceding symptoms, head strike, or loss of consciousness. No new injuries occurred during hospitalization, and fall reduction protocols were maintained throughout her stay.  Impaired Mobility and ADLs She required moderate to maximum assistance for bed mobility, transfers, and lower body ADLs, with improvement to minimal assistance and supervision for some  activities by discharge. She remained unable to ambulate and was dependent on a wheelchair for mobility, with persistent deficits in strength, endurance, and balance. Therapy recommended ongoing skilled PT/OT at low intensity, 5x weekly post-discharge, and deferred durable medical equipment  recommendations to the post-acute setting.  Oxygen  Requirement (Resolved) She was hypoxemic on presentation (SpO2 85%), but this was attributed to poor plethysmography and atelectasis; she was weaned off supplemental oxygen  and maintained good saturations on room air throughout the remainder of her hospitalization.  Hypertension Her antihypertensive regimen was continued during hospitalization, with blood pressures monitored and no acute changes documented.  History of Sarcoma and Left Above-Knee Amputation She has a history of left thigh sarcoma, status post left above-knee amputation in 2020, and is in remission. She uses a wheelchair at baseline and has adapted her home environment accordingly.  Discharge Plan She was medically stable and ready for discharge to a skilled nursing facility for subacute rehabilitation, with insurance authorization obtained and transportation arranged. She continued to require moderate assistance for mobility and ADLs, and ongoing skilled therapy was recommended post-discharge.  The patient's hospital stay has been complicated by the following clinically significant conditions requiring additional evaluation and treatment or having a significant effect of this patient's care: - Age related debility POA requiring additional resources: DME, PT, or OT   Outpatient Provider Follow Up Issues:  [ ]  follow up with PCP [ ]  follow up with orthopedics [ ]  Aspirin  81 mg twice daily X 30 days   Touchbase with Outpatient Provider: Warm Handoff: Completed on 01/30/24 by Misty JONETTA Hones, MD  (Resident) via [facility]  Message  Procedures: None ______________________________________________________________________ Discharge Medications:     Your Medication List     START taking these medications    cholecalciferol  (vitamin D3 25 mcg (1,000 units)) 1,000 unit (25 mcg) tablet Take 1 tablet (25 mcg total) by mouth daily.   lidocaine  5 % patch Commonly known as: LIDODERM  Place 1 patch on the skin daily. Apply to affected area for 12 hours only each day (then remove patch)       CHANGE how you take these medications    aspirin  81 MG tablet Commonly known as: ECOTRIN Take 1 tablet (81 mg total) by mouth two (2) times a day. What changed: when to take this       CONTINUE taking these medications    amlodipine  5 MG tablet Commonly known as: NORVASC  Take 1 tablet (5 mg total) by mouth daily.   clopidogrel  75 mg tablet Commonly known as: PLAVIX  Take 1 tablet (75 mg total) by mouth daily.   ezetimibe  10 mg tablet Commonly known as: ZETIA  Take 1 tablet (10 mg total) by mouth daily.   famotidine  20 MG tablet Commonly known as: PEPCID  Take 1 tablet (20 mg total) by mouth two (2) times a day.   ferrous sulfate  325 (65 FE) MG tablet Take 1 tablet (325 mg total) by mouth in the morning.   gabapentin  300 MG capsule Commonly known as: NEURONTIN  Take 1 capsule (300 mg total) by mouth two (2) times a day.   losartan  100 MG tablet Commonly known as: COZAAR  Take 1 tablet (100 mg total) by mouth daily.   mesalamine  800 mg tablet Commonly known as: ASACOL  HD Take 2 tablets (1,600 mg total) by mouth Three (3) times a day.   methIMAzole  5 MG tablet Commonly known as: TAPAZOLE  Take 1 tablet (5 mg total) by mouth. Take 1 tablet by mouth Monday, Wednesday, and Friday and take 0.5 tablets Tuesday, Thursday, Saturday, and sunday   rosuvastatin  10 MG tablet Commonly known as: CRESTOR  Take 1 tablet (10 mg total) by mouth daily.        Allergies: Cephalosporins, Naproxen, Penicillins,  Shellfish containing  products, Vancomycin , Codeine, Levofloxacin, and Rifampin ______________________________________________________________________ Pending Test Results:   Most Recent Labs: All lab results last 24 hours - No results found for this or any previous visit (from the past 24 hours).  Relevant Studies/Radiology: XR Pelvis 3 Or More Views Result Date: 01/26/2024 EXAM: XR PELVIS 3 OR MORE VIEWS DATE: 01/26/2024 9:30 AM ACCESSION: [id number] UN DICTATED: 01/26/2024 9:32 AM INTERPRETATION LOCATION: [facility] CLINICAL INDICATION: 83 years old Female with eval fracture    COMPARISON: Radiographs of the pelvis dated 01/22/2024 and a CT of the pelvis from the same day TECHNIQUE: AP, inlet, and outlet views of the pelvis.  FINDINGS: Evaluation is limited by diffuse osteopenia. Disarticulation and amputation of the left femur. Right hip joint alignment is maintained. Nondisplaced left inferior pubic rami fracture. Nondisplaced left inferior sacral ala fracture. Prior seen left superior pubic rami, and left anterior superior iliac spine fracture are better characterized on prior CT. Mild right hip and bilateral sacroiliac osteoarthritis. Diffuse vascular calcifications with bilateral iliac stents.   Nondisplaced left inferior pubic rami and left inferior sacral ala fracture. Prior seen superior pubic rami fracture and superior anterior iliac spine fracture not well visualized on this exam. No new fracture or dislocation. Mild to moderate osteoarthritis of the right hip and both sacroiliac joints.  XR Pelvis 3 Or More Views Result Date: 01/22/2024 EXAM: XR PELVIS 3 OR MORE VIEWS DATE: 01/22/2024 10:17 AM ACCESSION: [id number] UN DICTATED: 01/22/2024 11:16 AM INTERPRETATION LOCATION: [facility] CLINICAL INDICATION: 83 years old Female with eval for fracture    COMPARISON: Same day CT pelvis TECHNIQUE: AP, inlet, and outlet views of the pelvis.  FINDINGS: Diffuse osteopenia limits evaluation of  nondisplaced fracture. Minimally displaced left superior and inferior pubic ramus fracture. Undisplaced left sacral ala, left parasymphysis pubis and left anterior superior iliac spine fracture better seen on same-day CT. Sequelae of left leg amputation. Diffuse vascular calcification. Bilateral SI joint and pubic symphysis remain approximated. Mild right hip osteoarthrosis.     Diffuse osteopenia limits evaluation of nondisplaced fracture. Minimally displaced left superior and inferior pubic ramus fracture. Undisplaced left sacral ala, left parasymphysis pubis and left anterior superior iliac spine fracture better seen on same-day CT   CT Pelvis WO contrast (musculoskeletal) Result Date: 01/22/2024 EXAM: CT PELVIS WO CONTRAST MSK DATE: 01/22/2024 9:59 AM ACCESSION: [id number] UN DICTATED: 01/22/2024 10:25 AM INTERPRETATION LOCATION: [facility] CLINICAL INDICATION: 83 years old Female with abnormal x ray, eval for fx  COMPARISON: None. TECHNIQUE:  A axially-acquired helical CT scan of the pelvis was obtained without contrast. Contiguous transverse, coronal and sagittal images were reconstructed at 1 and 3-mm increments. FINDINGS: Bone: Undisplaced fracture of left sacral ala. Minimally displaced fracture of left pubic parasymphysis, left high superior and left inferior pubic ramus. Nondisplaced fracture of left anterior superior iliac spine. [Series 4 image [id number]]. Bilateral SI joint and pubic symphysis remain approximated. Diffuse osteopenia. Sequelae of left leg amputation. Severe fatty atrophy of left gluteal and pelvic muscles. Diffuse vascular calcification. No evidence of any intraperitoneal collection in the visualized portion.  1.  Undisplaced fracture of left sacral ala. 2.  Minimally displaced fracture of left pubic parasymphysis, left high superior and left inferior pubic ramus. 3.  Nondisplaced fracture of left anterior superior iliac spine 4.  Diffuse osteopenia noted which does limit evaluation  of further nondisplaced fractures. Consider MRI for better evaluation.  XR Chest Portable Result Date: 01/22/2024 EXAM: XR CHEST PORTABLE ACCESSION: [id number] UN REPORT DATE: 01/22/2024 12:25 AM CLINICAL INDICATION: HYPOXEMIA  TECHNIQUE: Single  View AP Chest Radiograph. COMPARISON: 09/13/2011 chest radiograph FINDINGS: Vascular stent projects over the superior mediastinum (history of innominate artery stent). Lungs are clear.  No pleural effusion or pneumothorax. Cardiac silhouette is normal in size. Aortic arch calcifications. Osteopenia. Multilevel degenerative changes of the partially imaged cervical spine.   No acute abnormalities.   XR Pelvis 1 Or 2 Views Result Date: 01/21/2024 EXAM: XR PELVIS 1 OR 2 VIEWS DATE: 01/21/2024 6:09 PM ACCESSION: [id number] UN DICTATED: 01/21/2024 8:02 PM INTERPRETATION LOCATION: [facility] CLINICAL INDICATION: 83 years old Female with fall, L hip pain    COMPARISON: None. TECHNIQUE: AP views of the pelvis.   1.  Minimal focal cortical buckling of the left ischiopubic ramus may reflect an acute nondisplaced fracture. Consider noncontrast CT pelvis to further evaluate if there is pain referable to this region. 2.  Disarticulation of the left lower extremity at the hip joint. 3.  Mild degenerative changes of the sacroiliac joints. 4.  Generalized osteopenia with more disproportionate osteopenia about the left acetabulum. 5.  Degenerative changes of the partially imaged lumbosacral spine. 6.  Arterial calcifications. Aortobiiliac stent.   ______________________________________________________________________ Discharge Instructions:  Activity Instructions     Activity as tolerated        Follow Up instructions and Outpatient Referrals    Call MD for:  difficulty breathing, headache or visual disturbances     Call MD for:  persistent nausea or vomiting     Call MD for:  severe uncontrolled pain     Call MD for:  temperature >38.5 Celsius        __________________________________________________________________ Discharge Day Services: BP 159/73   Pulse 77   Temp 36.9 C (98.5 F) (Temporal)   Resp 17   Ht 152.4 cm (5')   Wt 55.6 kg (122 lb 9.2 oz)   SpO2 95%   BMI 23.94 kg/m   Pt seen on the day of discharge and determined appropriate for discharge.  Condition at Discharge: stable  Length of Discharge: I spent greater than 30 mins in the discharge of this patient.

## 2024-02-06 DIAGNOSIS — Z89612 Acquired absence of left leg above knee: Secondary | ICD-10-CM | POA: Diagnosis not present

## 2024-02-06 DIAGNOSIS — R0602 Shortness of breath: Secondary | ICD-10-CM | POA: Diagnosis not present

## 2024-02-06 DIAGNOSIS — R011 Cardiac murmur, unspecified: Secondary | ICD-10-CM | POA: Diagnosis not present

## 2024-02-09 ENCOUNTER — Inpatient Hospital Stay
Admission: EM | Admit: 2024-02-09 | Discharge: 2024-02-14 | DRG: 175 | Disposition: A | Discharge status: Skilled Nursing Facility | Source: Ambulatory Visit | Attending: Student in an Organized Health Care Education/Training Program | Admitting: Student in an Organized Health Care Education/Training Program

## 2024-02-09 ENCOUNTER — Other Ambulatory Visit: Payer: Self-pay | Admitting: Cardiology

## 2024-02-09 ENCOUNTER — Ambulatory Visit
Admission: RE | Admit: 2024-02-09 | Discharge: 2024-02-09 | Disposition: A | Discharge status: Home / Self Care | Source: Ambulatory Visit | Attending: Cardiology

## 2024-02-09 DIAGNOSIS — Z79899 Other long term (current) drug therapy: Secondary | ICD-10-CM

## 2024-02-09 DIAGNOSIS — I2489 Other forms of acute ischemic heart disease: Secondary | ICD-10-CM | POA: Diagnosis not present

## 2024-02-09 DIAGNOSIS — I2609 Other pulmonary embolism with acute cor pulmonale: Secondary | ICD-10-CM | POA: Diagnosis not present

## 2024-02-09 DIAGNOSIS — K219 Gastro-esophageal reflux disease without esophagitis: Secondary | ICD-10-CM | POA: Diagnosis present

## 2024-02-09 DIAGNOSIS — K5190 Ulcerative colitis, unspecified, without complications: Secondary | ICD-10-CM | POA: Diagnosis not present

## 2024-02-09 DIAGNOSIS — Z1152 Encounter for screening for COVID-19: Secondary | ICD-10-CM

## 2024-02-09 DIAGNOSIS — I10 Essential (primary) hypertension: Secondary | ICD-10-CM | POA: Diagnosis not present

## 2024-02-09 DIAGNOSIS — Z9181 History of falling: Secondary | ICD-10-CM

## 2024-02-09 DIAGNOSIS — Z91013 Allergy to seafood: Secondary | ICD-10-CM

## 2024-02-09 DIAGNOSIS — Z87891 Personal history of nicotine dependence: Secondary | ICD-10-CM | POA: Diagnosis not present

## 2024-02-09 DIAGNOSIS — E782 Mixed hyperlipidemia: Secondary | ICD-10-CM | POA: Diagnosis not present

## 2024-02-09 DIAGNOSIS — J9601 Acute respiratory failure with hypoxia: Secondary | ICD-10-CM | POA: Diagnosis present

## 2024-02-09 DIAGNOSIS — I252 Old myocardial infarction: Secondary | ICD-10-CM

## 2024-02-09 DIAGNOSIS — Z87442 Personal history of urinary calculi: Secondary | ICD-10-CM

## 2024-02-09 DIAGNOSIS — R000 Tachycardia, unspecified: Secondary | ICD-10-CM | POA: Diagnosis not present

## 2024-02-09 DIAGNOSIS — E0590 Thyrotoxicosis, unspecified without thyrotoxic crisis or storm: Secondary | ICD-10-CM | POA: Diagnosis present

## 2024-02-09 DIAGNOSIS — Z7902 Long term (current) use of antithrombotics/antiplatelets: Secondary | ICD-10-CM

## 2024-02-09 DIAGNOSIS — Z955 Presence of coronary angioplasty implant and graft: Secondary | ICD-10-CM

## 2024-02-09 DIAGNOSIS — Z823 Family history of stroke: Secondary | ICD-10-CM

## 2024-02-09 DIAGNOSIS — Z7901 Long term (current) use of anticoagulants: Secondary | ICD-10-CM | POA: Diagnosis not present

## 2024-02-09 DIAGNOSIS — Z8249 Family history of ischemic heart disease and other diseases of the circulatory system: Secondary | ICD-10-CM

## 2024-02-09 DIAGNOSIS — Z880 Allergy status to penicillin: Secondary | ICD-10-CM

## 2024-02-09 DIAGNOSIS — Z66 Do not resuscitate: Secondary | ICD-10-CM | POA: Diagnosis not present

## 2024-02-09 DIAGNOSIS — Z886 Allergy status to analgesic agent status: Secondary | ICD-10-CM | POA: Diagnosis not present

## 2024-02-09 DIAGNOSIS — R918 Other nonspecific abnormal finding of lung field: Secondary | ICD-10-CM | POA: Diagnosis not present

## 2024-02-09 DIAGNOSIS — J90 Pleural effusion, not elsewhere classified: Secondary | ICD-10-CM | POA: Diagnosis present

## 2024-02-09 DIAGNOSIS — I2510 Atherosclerotic heart disease of native coronary artery without angina pectoris: Secondary | ICD-10-CM | POA: Diagnosis not present

## 2024-02-09 DIAGNOSIS — R7989 Other specified abnormal findings of blood chemistry: Secondary | ICD-10-CM | POA: Insufficient documentation

## 2024-02-09 DIAGNOSIS — Z8781 Personal history of (healed) traumatic fracture: Secondary | ICD-10-CM

## 2024-02-09 DIAGNOSIS — R0602 Shortness of breath: Secondary | ICD-10-CM | POA: Diagnosis not present

## 2024-02-09 DIAGNOSIS — Z723 Lack of physical exercise: Secondary | ICD-10-CM

## 2024-02-09 DIAGNOSIS — Z85831 Personal history of malignant neoplasm of soft tissue: Secondary | ICD-10-CM

## 2024-02-09 DIAGNOSIS — Z8261 Family history of arthritis: Secondary | ICD-10-CM

## 2024-02-09 DIAGNOSIS — M810 Age-related osteoporosis without current pathological fracture: Secondary | ICD-10-CM | POA: Diagnosis present

## 2024-02-09 DIAGNOSIS — N201 Calculus of ureter: Secondary | ICD-10-CM

## 2024-02-09 DIAGNOSIS — E7800 Pure hypercholesterolemia, unspecified: Secondary | ICD-10-CM | POA: Diagnosis present

## 2024-02-09 DIAGNOSIS — I350 Nonrheumatic aortic (valve) stenosis: Secondary | ICD-10-CM | POA: Diagnosis not present

## 2024-02-09 DIAGNOSIS — Z89612 Acquired absence of left leg above knee: Secondary | ICD-10-CM | POA: Diagnosis not present

## 2024-02-09 DIAGNOSIS — Z885 Allergy status to narcotic agent status: Secondary | ICD-10-CM

## 2024-02-09 DIAGNOSIS — Z8673 Personal history of transient ischemic attack (TIA), and cerebral infarction without residual deficits: Secondary | ICD-10-CM | POA: Diagnosis not present

## 2024-02-09 DIAGNOSIS — S78112A Complete traumatic amputation at level between left hip and knee, initial encounter: Secondary | ICD-10-CM | POA: Diagnosis not present

## 2024-02-09 DIAGNOSIS — Z466 Encounter for fitting and adjustment of urinary device: Secondary | ICD-10-CM

## 2024-02-09 DIAGNOSIS — I2721 Secondary pulmonary arterial hypertension: Secondary | ICD-10-CM | POA: Diagnosis present

## 2024-02-09 DIAGNOSIS — M1990 Unspecified osteoarthritis, unspecified site: Secondary | ICD-10-CM | POA: Diagnosis present

## 2024-02-09 DIAGNOSIS — Z90710 Acquired absence of both cervix and uterus: Secondary | ICD-10-CM

## 2024-02-09 DIAGNOSIS — Z83438 Family history of other disorder of lipoprotein metabolism and other lipidemia: Secondary | ICD-10-CM

## 2024-02-09 DIAGNOSIS — Z7982 Long term (current) use of aspirin: Secondary | ICD-10-CM

## 2024-02-09 DIAGNOSIS — Z7401 Bed confinement status: Secondary | ICD-10-CM | POA: Diagnosis not present

## 2024-02-09 DIAGNOSIS — I2699 Other pulmonary embolism without acute cor pulmonale: Secondary | ICD-10-CM

## 2024-02-09 DIAGNOSIS — Z881 Allergy status to other antibiotic agents status: Secondary | ICD-10-CM

## 2024-02-09 LAB — CBC
HCT: 34.6 % — ABNORMAL LOW (ref 36.0–46.0)
Hemoglobin: 11.8 g/dL — ABNORMAL LOW (ref 12.0–15.0)
MCH: 31.8 pg (ref 26.0–34.0)
MCHC: 34.1 g/dL (ref 30.0–36.0)
MCV: 93.3 fL (ref 80.0–100.0)
Platelets: 467 K/uL — ABNORMAL HIGH (ref 150–400)
RBC: 3.71 MIL/uL — ABNORMAL LOW (ref 3.87–5.11)
RDW: 14.5 % (ref 11.5–15.5)
WBC: 11 K/uL — ABNORMAL HIGH (ref 4.0–10.5)
nRBC: 0 % (ref 0.0–0.2)

## 2024-02-09 LAB — BASIC METABOLIC PANEL WITH GFR
Anion gap: 12 (ref 5–15)
BUN: 23 mg/dL (ref 8–23)
CO2: 20 mmol/L — ABNORMAL LOW (ref 22–32)
Calcium: 8.3 mg/dL — ABNORMAL LOW (ref 8.9–10.3)
Chloride: 102 mmol/L (ref 98–111)
Creatinine, Ser: 0.71 mg/dL (ref 0.44–1.00)
GFR, Estimated: >60 mL/min (ref >60)
Glucose, Bld: 122 mg/dL — ABNORMAL HIGH (ref 70–99)
Potassium: 3.3 mmol/L — ABNORMAL LOW (ref 3.5–5.1)
Sodium: 134 mmol/L — ABNORMAL LOW (ref 135–145)

## 2024-02-09 LAB — RESP PANEL BY RT-PCR (RSV, FLU A&B, COVID)  RVPGX2
Influenza A by PCR: NEGATIVE
Influenza B by PCR: NEGATIVE
Resp Syncytial Virus by PCR: NEGATIVE
SARS Coronavirus 2 by RT PCR: NEGATIVE

## 2024-02-09 LAB — TROPONIN I (HIGH SENSITIVITY)
Troponin I (High Sensitivity): 36 ng/L — ABNORMAL HIGH (ref <18)
Troponin I (High Sensitivity): 45 ng/L — ABNORMAL HIGH (ref <18)

## 2024-02-09 LAB — APTT: aPTT: 31 s (ref 24–36)

## 2024-02-09 LAB — LACTIC ACID, PLASMA: Lactic Acid, Venous: 0.8 mmol/L (ref 0.5–1.9)

## 2024-02-09 LAB — GLUCOSE, CAPILLARY: Glucose-Capillary: 118 mg/dL — ABNORMAL HIGH (ref 70–99)

## 2024-02-09 LAB — PROTIME-INR
INR: 1 (ref 0.8–1.2)
Prothrombin Time: 14.3 s (ref 11.4–15.2)

## 2024-02-09 LAB — PROCALCITONIN: Procalcitonin: <0.1 ng/mL

## 2024-02-09 MED ORDER — ACETAMINOPHEN 325 MG PO TABS: 650.0000 mg | ORAL_TABLET | Freq: Four times a day (QID) | ORAL | Status: DC | PRN

## 2024-02-09 MED ORDER — HEPARIN (PORCINE) 25000 UT/250ML-% IV SOLN
1000.0000 [IU]/h | INTRAVENOUS | Status: DC
  Administered 2024-02-09: 1000 [IU]/h via INTRAVENOUS
  Filled 2024-02-09: qty 250

## 2024-02-09 MED ORDER — LOSARTAN POTASSIUM 50 MG PO TABS
100.0000 mg | ORAL_TABLET | Freq: Every day | ORAL | Status: DC
  Administered 2024-02-10 – 2024-02-14 (×5): 100 mg via ORAL
  Filled 2024-02-09 (×5): qty 2

## 2024-02-09 MED ORDER — HYDRALAZINE HCL 20 MG/ML IJ SOLN
10.0000 mg | Freq: Once | INTRAMUSCULAR | Status: AC
  Administered 2024-02-09: 10 mg via INTRAVENOUS
  Filled 2024-02-09: qty 1

## 2024-02-09 MED ORDER — EZETIMIBE 10 MG PO TABS
10.0000 mg | ORAL_TABLET | Freq: Every day | ORAL | Status: DC
  Administered 2024-02-11 – 2024-02-14 (×4): 10 mg via ORAL
  Filled 2024-02-09 (×4): qty 1

## 2024-02-09 MED ORDER — FAMOTIDINE 20 MG PO TABS
20.0000 mg | ORAL_TABLET | Freq: Two times a day (BID) | ORAL | Status: DC
  Administered 2024-02-09 – 2024-02-14 (×10): 20 mg via ORAL
  Filled 2024-02-09 (×10): qty 1

## 2024-02-09 MED ORDER — HYDROMORPHONE HCL 1 MG/ML IJ SOLN: 0.5000 mg | INTRAMUSCULAR | Status: DC | PRN

## 2024-02-09 MED ORDER — CHLORHEXIDINE GLUCONATE CLOTH 2 % EX PADS
6.0000 | MEDICATED_PAD | Freq: Every day | CUTANEOUS | Status: DC
  Administered 2024-02-09 – 2024-02-12 (×4): 6 via TOPICAL

## 2024-02-09 MED ORDER — FUROSEMIDE 10 MG/ML IJ SOLN
40.0000 mg | Freq: Once | INTRAMUSCULAR | Status: AC
  Administered 2024-02-09: 40 mg via INTRAVENOUS
  Filled 2024-02-09: qty 4

## 2024-02-09 MED ORDER — ONDANSETRON HCL 4 MG PO TABS: 4.0000 mg | ORAL_TABLET | Freq: Four times a day (QID) | ORAL | Status: DC | PRN

## 2024-02-09 MED ORDER — HYDROCODONE-ACETAMINOPHEN 5-325 MG PO TABS: 1.0000 | ORAL_TABLET | Freq: Four times a day (QID) | ORAL | Status: DC | PRN

## 2024-02-09 MED ORDER — ROSUVASTATIN CALCIUM 10 MG PO TABS
10.0000 mg | ORAL_TABLET | Freq: Every day | ORAL | Status: DC
  Administered 2024-02-10 – 2024-02-14 (×5): 10 mg via ORAL
  Filled 2024-02-09 (×5): qty 1

## 2024-02-09 MED ORDER — ACETAMINOPHEN 650 MG RE SUPP: 650.0000 mg | Freq: Four times a day (QID) | RECTAL | Status: DC | PRN

## 2024-02-09 MED ORDER — HYDRALAZINE HCL 20 MG/ML IJ SOLN: 5.0000 mg | INTRAMUSCULAR | Status: DC | PRN

## 2024-02-09 MED ORDER — IOHEXOL 350 MG/ML SOLN
75.0000 mL | Freq: Once | INTRAVENOUS | Status: AC | PRN
  Administered 2024-02-09: 75 mL via INTRAVENOUS

## 2024-02-09 MED ORDER — HEPARIN BOLUS VIA INFUSION
4000.0000 [IU] | Freq: Once | INTRAVENOUS | Status: AC
  Administered 2024-02-09: 4000 [IU] via INTRAVENOUS
  Filled 2024-02-09: qty 4000

## 2024-02-09 MED ORDER — ONDANSETRON HCL 4 MG/2ML IJ SOLN: 4.0000 mg | Freq: Four times a day (QID) | INTRAMUSCULAR | Status: DC | PRN

## 2024-02-09 NOTE — Assessment & Plan Note (Signed)
 Pending med rec for verification of methimazole 

## 2024-02-09 NOTE — Assessment & Plan Note (Signed)
 No acute issues suspected

## 2024-02-09 NOTE — Assessment & Plan Note (Addendum)
 Continue losartan .  Will hold home amlodipine  Hydralazine  IV as needed for additional BP control

## 2024-02-09 NOTE — ED Notes (Signed)
 Attempted to call for report, awaiting callback

## 2024-02-09 NOTE — ED Notes (Signed)
RN attempted IV access x2 without success.

## 2024-02-09 NOTE — Consult Note (Signed)
 PHARMACY - ANTICOAGULATION CONSULT NOTE  Pharmacy Consult for Heparin  infusion Indication: pulmonary embolus  Allergies  Allergen Reactions   Amoxicillin Anaphylaxis   Cephalosporins Swelling and Rash    Lip swelling and rash on CTX and Vancomycin .   Naproxen Hives, Shortness Of Breath, Nausea And Vomiting and Swelling   Penicillins Shortness Of Breath, Swelling and Rash    Tolerated ceftriaxone  08/2022   Codeine Nausea And Vomiting   Levofloxacin Nausea And Vomiting   Shellfish Allergy Hives, Diarrhea, Nausea And Vomiting and Swelling   Vancomycin  Swelling and Rash    Occurred while on CTX and Vancomycin . More likely CTX as she has anaphylaxis to PCN, but should monitor closely.    Patient Measurements: Height: (P) 5' (152.4 cm) Weight: 60.3 kg (133 lb) IBW/kg (Calculated) : (P) 45.5 HEPARIN  DW (KG): (P) 57.9  Vital Signs: Temp: 98 F (36.7 C) (09/11 1825) Temp Source: Oral (09/11 1825) BP: 169/95 (09/11 1825) Pulse Rate: 55 (09/11 1825)  Labs: Recent Labs    02/09/24 1827  TROPONINIHS 36*    CrCl cannot be calculated (Patient's most recent lab result is older than the maximum 21 days allowed.).   Medical History: Past Medical History:  Diagnosis Date   Allergy    Anemia    Arthritis    CA of skin 10/15/2014   Calculus of kidney 10/15/2014   Seen in ER 06/16/10.    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   Coronary artery disease    Diverticulitis    Environmental and seasonal allergies    Fibrocystic breast disease ([facility])    GERD (gastroesophageal reflux disease)    Heart murmur    High cholesterol    History of heart artery stent    History of kidney stones    Hyperlipidemia    Hypertension    Hyperthyroidism    Myocardial infarction [facility])    Osteomyelitis of left femur (HCC) 11/30/2018   Osteoporosis    PONV (postoperative nausea and vomiting)    Reactive depression 02/25/2019    Thyroid  disease    TIA (transient ischemic attack)     Medications:  No AC prior to admission per med list and fill history  Assessment: Patient admitted with shortness of breath. PMH includes gastric reflux, hypertension, hyperlipidemia, left lower extremity amputation at the hip, recent hospitalization for a fall/pelvis fracture.   Pharmacy consulted to start and manage heparin  for acute PE.  Baseline labs:  Goal of Therapy:  Heparin  level 0.3-0.7 units/ml Monitor platelets by anticoagulation protocol: Yes   Plan:  Give 4000 units bolus x 1 Start heparin  infusion at 1000 units/hr Check HL in 8 hours and daily while on heparin  Continue to monitor H&H and platelets  Kamille Toomey Rodriguez-Guzman PharmD, BCPS 02/09/2024 7:46 PM

## 2024-02-09 NOTE — Assessment & Plan Note (Signed)
 Currently at SNF for rehab Resume PT when appropriate

## 2024-02-09 NOTE — ED Notes (Signed)
Pt placed on purwick at this time 

## 2024-02-09 NOTE — Hospital Course (Signed)
 SABRA

## 2024-02-09 NOTE — H&P (Signed)
 History and Physical    Patient: Misty Page FMW:[id number] DOB: 1940-12-13 DOA: 02/09/2024 DOS: the patient was seen and examined on 02/09/2024 PCP: Misty [name], MD  Patient coming from: SNF  Chief Complaint:  Chief Complaint  Patient presents with   Shortness of Breath    HPI: Misty Page is a 83 y.o. female with medical history significant for HTN, HLD, ulcerative colitis, CAD s/p PCI for prior MI, left AKA for sarcoma, recently hospitalized at [facility] (8/23 to 01/30/2024) for multiple pelvic fractures related to ground-level fall, treated nonoperatively and discharged to rehab on DVT prophylaxis with ASA 81 mg twice daily, now being admitted with acute PE with hypoxic respiratory failure.  She developed shortness of breath a few days ago, and has been on O2 via nasal cannula since 9/8.  She subsequently developed chest pain with breathing.  Outpatient CTA chest showed small PE within segmental branches of right middle lobe with evidence of right heart strain (RV LV ratio 1.5) consistent with at least submassive PE.  Also showed moderate-sized bilateral pleural effusions and patchy airspace disease/cardiomegaly/pulmonary artery hypertension. On arrival to the ED, she was tachypneic to the mid 20s requiring 2 L O2 to maintain sats in the low to mid 90s.  BP mostly in the 180s/50s to 60s.EKG showed sinus bradycardia at 54 with nonspecific ST-T wave changes Labs notable for the following: Troponin 45, WBC 11, hemoglobin 11.8, potassium 3.3, negative respiratory viral panel  ED provider spoke with vascular surgeon Dr. Jama who recommended heparin  infusion and keeping patient n.p.o. for possible thrombectomy in the a.m.  Admission requested     Review of Systems: As mentioned in the history of present illness. All other systems reviewed and are negative.  Past Medical History:  Diagnosis Date   Allergy    Anemia    Arthritis    CA of skin 10/15/2014   Calculus of kidney  10/15/2014   Seen in ER 06/16/10.    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   Coronary artery disease    Diverticulitis    Environmental and seasonal allergies    Fibrocystic breast disease (FCBD)    GERD (gastroesophageal reflux disease)    Heart murmur    High cholesterol    History of heart artery stent    History of kidney stones    Hyperlipidemia    Hypertension    Hyperthyroidism    Myocardial infarction (HCC)    Osteomyelitis of left femur (HCC) 11/30/2018   Osteoporosis    PONV (postoperative nausea and vomiting)    Reactive depression 02/25/2019   Thyroid  disease    TIA (transient ischemic attack)    Past Surgical History:  Procedure Laterality Date   ABDOMINAL HYSTERECTOMY  2007   BREAST ENHANCEMENT SURGERY  2004   BREAST IMPLANT REMOVAL     BREAST SURGERY  1984   Fibrocystic Disease   CAROTID ENDARTERECTOMY Left 06/21/2014   Dr. Jama   CAROTID ENDARTERECTOMY Right 08/02/2014   Dr. Jama   CAROTID PTA/STENT INTERVENTION N/A 06/15/2017   Procedure: CAROTID PTA/STENT INTERVENTION;  Surgeon: Jama Cordella MATSU, MD;  Location: [facility] INVASIVE CV LAB;  Service: Cardiovascular;  Laterality: N/A;   COLONOSCOPY WITH PROPOFOL  N/A 07/14/2015   Procedure: COLONOSCOPY WITH PROPOFOL ;  Surgeon: Deward CINDERELLA Piedmont, MD;  Location: [facility] ENDOSCOPY;  Service: Gastroenterology;  Laterality: N/A;   COLONOSCOPY WITH PROPOFOL  N/A 02/08/2018   Procedure: COLONOSCOPY  WITH PROPOFOL ;  Surgeon: Toledo, Ladell POUR, MD;  Location: [facility] ENDOSCOPY;  Service: Gastroenterology;  Laterality: N/A;   CORONARY ANGIOPLASTY WITH STENT PLACEMENT  1999   CYSTOSCOPY WITH STENT PLACEMENT Right 09/20/2022   Procedure: CYSTOSCOPY WITH STENT PLACEMENT;  Surgeon: Penne Knee, MD;  Location: [facility] ORS;  Service: Urology;  Laterality: Right;   CYSTOSCOPY/URETEROSCOPY/HOLMIUM LASER/STENT PLACEMENT Right 10/07/2022   Procedure:  CYSTOSCOPY/URETEROSCOPY/HOLMIUM LASER/STENT PLACEMENT;  Surgeon: Penne Knee, MD;  Location: [facility] ORS;  Service: Urology;  Laterality: Right;   ESOPHAGOGASTRODUODENOSCOPY (EGD) WITH PROPOFOL  N/A 02/08/2018   Procedure: ESOPHAGOGASTRODUODENOSCOPY (EGD) WITH PROPOFOL ;  Surgeon: Toledo, Ladell POUR, MD;  Location: [facility] ENDOSCOPY;  Service: Gastroenterology;  Laterality: N/A;   FEMORAL ARTERY STENT  08/2003   HERNIA REPAIR  2008   LEG AMPUTATION AT HIP Left 02/28/2019   MASTECTOMY     RENAL ARTERY STENT  08/2003   TRANSUMBILICAL AUGMENTATION MAMMAPLASTY     VASCULAR SURGERY     VISCERAL ARTERY INTERVENTION N/A 02/13/2020   Procedure: VISCERAL ARTERY INTERVENTION;  Surgeon: Marea Selinda RAMAN, MD;  Location: [facility] INVASIVE CV LAB;  Service: Cardiovascular;  Laterality: N/A;   Social History:  reports that she quit smoking about 29 years ago. Her smoking use included cigarettes. She has never used smokeless tobacco. She reports that she does not currently use alcohol. She reports that she does not use drugs.  Allergies  Allergen Reactions   Amoxicillin Anaphylaxis   Cephalosporins Swelling and Rash    Lip swelling and rash on CTX and Vancomycin .   Naproxen Hives, Shortness Of Breath, Nausea And Vomiting and Swelling   Penicillins Shortness Of Breath, Swelling and Rash    Tolerated ceftriaxone  08/2022   Codeine Nausea And Vomiting   Levofloxacin Nausea And Vomiting   Shellfish Allergy Hives, Diarrhea, Nausea And Vomiting and Swelling   Vancomycin  Swelling and Rash    Occurred while on CTX and Vancomycin . More likely CTX as she has anaphylaxis to PCN, but should monitor closely.    Family History  Problem Relation Age of Onset   Hyperlipidemia Mother    Congestive Heart Failure Mother    COPD Mother    Heart disease Mother    Hypertension Mother    Osteoporosis Mother    Pancreatic cancer Father    Arthritis Sister    Alzheimer's disease Sister    Hypertension Sister    Prostate cancer  Brother    Heart attack Brother    Stomach cancer Brother    Kidney failure Brother    Leukemia Brother    Emphysema Brother    Pancreatic cancer Brother    Stomach cancer Brother    Breast cancer Paternal Grandmother    Leukemia Paternal Grandfather    Heart attack Maternal Uncle    Stroke Maternal Aunt     Prior to Admission medications   Medication Sig Start Date End Date Taking? Authorizing Provider  acetaminophen  (TYLENOL ) 500 MG tablet Take 500-1,000 mg by mouth every 8 (eight) hours as needed.    [provider]  amLODipine  (NORVASC ) 5 MG tablet TAKE 1 TABLET BY MOUTH DAILY 06/30/23   [name], Angela M, MD  Ascorbic Acid  (VITAMIN C ) 1000 MG tablet TAKE 1 TABLET BY MOUTH EVERY MORNING 01/24/24   Misty [name], MD  aspirin  EC 81 MG tablet Take 81 mg by mouth daily. Swallow whole.    [provider]  Bacillus Coagulans-Inulin (PROBIOTIC-PREBIOTIC) 1-250 BILLION-MG CAPS TAKE 1 CAPSULE BY MOUTH DAILY 11/29/23   [name], Angela M,  MD  CALCIUM +D3 600-20 MG-MCG TABS TAKE 1 TABLET BY MOUTH DAILY 06/10/23   Misty [name], MD  clopidogrel  (PLAVIX ) 75 MG tablet TAKE 1 TABLET BY MOUTH DAILY 09/05/23   [name], Angela M, MD  ezetimibe  (ZETIA ) 10 MG tablet TAKE 1 TABLET BY MOUTH DAILY 12/26/23   [name], Angela M, MD  famotidine  (PEPCID ) 20 MG tablet TAKE 1 TABLET BY MOUTH TWICE A DAY 12/26/23   [name], Angela M, MD  fluticasone  (FLONASE ) 50 MCG/ACT nasal spray USE 2 SPRAYS EACH NOSTRIL DAILY 08/21/21   [name], Angela M, MD  gabapentin  (NEURONTIN ) 300 MG capsule TAKE ONE CAPSULE BY MOUTH TWICE A DAY 12/01/23   [name], [name], MD  HYDROcodone -acetaminophen  (NORCO/VICODIN) 5-325 MG tablet Take 1-2 tablets by mouth every 6 (six) hours as needed for moderate pain. 10/07/22   Penne Knee, MD  iron  polysaccharides (NIFEREX) 150 MG capsule Take 1 capsule (150 mg total) by mouth daily. 11/26/22   [name], Angela M, MD  losartan  (COZAAR ) 100 MG tablet  TAKE 1 TABLET BY MOUTH DAILY 09/05/23   [name], Angela M, MD  meclizine  (ANTIVERT ) 25 MG tablet Take 1 tablet (25 mg total) by mouth 3 (three) times daily as needed for dizziness or nausea. Patient not taking: Reported on 12/28/2023 04/12/21   Viviann Pastor, MD  Mesalamine  800 MG TBEC Take 800 mg by mouth in the morning, at noon, and at bedtime. 09/26/17   [provider]  methimazole  (TAPAZOLE ) 5 MG tablet Take 5 mg by mouth. 1 Tablet on Mondays, Wednesdays and Fridays and take 1/2 tablet daily the rest of the week. 03/30/17   [provider]  Probiotic Product (PROBIOTIC DAILY PO) Take 1 capsule by mouth daily.    [provider]  rosuvastatin  (CRESTOR ) 10 MG tablet TAKE 1 TABLET BY MOUTH DAILY 12/26/23   Misty [name], MD  rosuvastatin  (CRESTOR ) 20 MG tablet TAKE 1/2 TABLET BY MOUTH DAILY 06/30/23   Misty [name], MD    Physical Exam: Vitals:   02/09/24 1916 02/09/24 2000 02/09/24 2045 02/09/24 2130  BP:  (!) 204/66 (!) 189/55 (!) 181/49  Pulse:  66 (!) 51 61  Resp:  (!) 21 (!) 21 (!) 22  Temp:  98.7 F (37.1 C)    TempSrc:  Oral    SpO2:  99% 98% 97%  Weight: 60.3 kg     Height: 5' (1.524 m)      Physical Exam  Labs on Admission: I have personally reviewed following labs and imaging studies  CBC: Recent Labs  Lab 02/09/24 1914  WBC 11.0*  HGB 11.8*  HCT 34.6*  MCV 93.3  PLT 467*   Basic Metabolic Panel: Recent Labs  Lab 02/09/24 1914  NA 134*  K 3.3*  CL 102  CO2 20*  GLUCOSE 122*  BUN 23  CREATININE 0.71  CALCIUM  8.3*   GFR: Estimated Creatinine Clearance: 43.2 mL/min (by C-G formula based on SCr of 0.71 mg/dL). Liver Function Tests: No results for input(s): AST, ALT, ALKPHOS, BILITOT, PROT, ALBUMIN in the last 168 hours. No results for input(s): LIPASE, AMYLASE in the last 168 hours. No results for input(s): AMMONIA in the last 168 hours. Coagulation Profile: Recent Labs  Lab 02/09/24 1914   INR 1.0   Cardiac Enzymes: No results for input(s): CKTOTAL, CKMB, CKMBINDEX, TROPONINI in the last 168 hours. BNP (last 3 results) No results for input(s): PROBNP in the last 8760 hours. HbA1C: No results for input(s): HGBA1C in the last 72 hours.  CBG: No results for input(s): GLUCAP in the last 168 hours. Lipid Profile: No results for input(s): CHOL, HDL, LDLCALC, TRIG, CHOLHDL, LDLDIRECT in the last 72 hours. Thyroid  Function Tests: No results for input(s): TSH, T4TOTAL, FREET4, T3FREE, THYROIDAB in the last 72 hours. Anemia Panel: No results for input(s): VITAMINB12, FOLATE, FERRITIN, TIBC, IRON , RETICCTPCT in the last 72 hours. Urine analysis:    Component Value Date/Time   COLORURINE YELLOW (A) 09/19/2022 0509   APPEARANCEUR HAZY (A) 09/19/2022 0509   APPEARANCEUR Clear 05/22/2014 2223   LABSPEC 1.011 09/19/2022 0509   LABSPEC 1.024 05/22/2014 2223   PHURINE 7.0 09/19/2022 0509   GLUCOSEU NEGATIVE 09/19/2022 0509   GLUCOSEU Negative 05/22/2014 2223   HGBUR LARGE (A) 09/19/2022 0509   BILIRUBINUR negative 01/28/2023 1012   BILIRUBINUR Negative 05/22/2014 2223   KETONESUR 5 (A) 09/19/2022 0509   PROTEINUR Negative 01/28/2023 1012   PROTEINUR 30 (A) 09/19/2022 0509   UROBILINOGEN 0.2 01/28/2023 1012   NITRITE positive 01/28/2023 1012   NITRITE NEGATIVE 09/19/2022 0509   LEUKOCYTESUR Moderate (2+) (A) 01/28/2023 1012   LEUKOCYTESUR SMALL (A) 09/19/2022 0509   LEUKOCYTESUR Trace 05/22/2014 2223    Radiological Exams on Admission: CT Angio Chest Pulmonary Embolism (PE) W or WO Contrast Addendum Date: 02/09/2024 ADDENDUM REPORT: 02/09/2024 17:59 ADDENDUM: These results were called by telephone at the time of interpretation on 02/09/2024 at 5:58 pm to provider Dr. Ammon, who verbally acknowledged these results. Electronically Signed   By: Greig Pique M.D.   On: 02/09/2024 17:59   Result Date: 02/09/2024 CLINICAL DATA:   SOB EXAM: CT ANGIOGRAPHY CHEST WITH CONTRAST TECHNIQUE: Multidetector CT imaging of the chest was performed using the standard protocol during bolus administration of intravenous contrast. Multiplanar CT image reconstructions and MIPs were obtained to evaluate the vascular anatomy. RADIATION DOSE REDUCTION: This exam was performed according to the departmental dose-optimization program which includes automated exposure control, adjustment of the mA and/or kV according to patient size and/or use of iterative reconstruction technique. CONTRAST:  75mL OMNIPAQUE  IOHEXOL  350 MG/ML SOLN COMPARISON:  CT PE protocol 02/14/2020. FINDINGS: Cardiovascular: There is adequate opacification of the pulmonary arteries to the segmental level. There small pulmonary emboli within segmental branches of the right middle lobe. No other pulmonary emboli are identified. Main pulmonary artery is enlarged compatible with pulmonary artery hypertension. The heart is enlarged. There is no pericardial effusion. There are atherosclerotic calcifications of the aorta and coronary arteries. There is a vascular stent in the region of the right brachiocephalic artery. Mediastinum/Nodes: There are punctate calcifications in the thyroid  gland. There is a hypodense left thyroid  nodule measuring 6 mm. There are scattered nonenlarged mediastinal lymph nodes diffusely. Visualized esophagus within normal limits. Lungs/Pleura: There are moderate-sized bilateral pleural effusions. Mild emphysema present. There are are mild patchy multifocal airspace and ill-defined nodular densities throughout the bilateral upper lobes. There some atelectasis in the bilateral lower lobes and small amount of airspace disease in the right lower lobe. Upper Abdomen: No acute abnormality. Musculoskeletal: Bilateral breast implants are present. No acute osseous abnormality. Stable mild compression deformity of L2 Review of the MIP images confirms the above findings. IMPRESSION: 1.  Small pulmonary emboli within segmental branches of the right middle lobe. Positive for acute PE with CTevidence of right heart strain (RV/LV Ratio = 1.5) consistent with at least submassive (intermediate risk) PE. The presence of right heart strain has been associated with an increased risk of morbidity and mortality. 2. Moderate-sized bilateral pleural effusions. 3. Mild patchy  airspace and ill-defined nodular densities in the bilateral upper lobes and right lower lobe worrisome for multifocal pneumonia. 4. Cardiomegaly. 5. Findings compatible with pulmonary artery hypertension. Aortic Atherosclerosis (ICD10-I70.0) and Emphysema (ICD10-J43.9). Electronically Signed: By: Greig Pique M.D. On: 02/09/2024 17:47   Data Reviewed for HPI: Relevant notes from primary care and specialist visits, past discharge summaries as available in EHR, including Care Everywhere. Prior diagnostic testing as pertinent to current admission diagnoses Updated medications and problem lists for reconciliation ED course, including vitals, labs, imaging, treatment and response to treatment Triage notes, nursing and pharmacy notes and ED provider's notes Notable results as noted above in HPI      Assessment and Plan: * Acute pulmonary embolism with acute cor pulmonale (HCC) Acute respiratory failure with hypoxia Bilateral pleural effusions History of pelvic fracture from ground-level fall 01/21/2024 CT showing evidence of right heart strain and moderate bilateral pleural effusions Likely provoked PE related to decreased mobility from recent pelvic fracture, (does have remote history of sarcoma) Continue heparin  infusion Continue supplemental oxygen  Pain control Echocardiogram Prn IV lasix  trial for increased work of breathing related to cor pulmonale Formal vascular consult N.p.o. from midnight for possible vascular procedure in the a.m.  History of pelvic fracture 01/21/24 Currently at SNF for rehab Resume PT  when appropriate  CAD (coronary artery disease) Mildly elevated troponin Troponin 45, likely related to ischemia On aspirin , Zetia , rosuvastatin  and losartan  Continue to monitor  Essential (primary) hypertension Continue losartan .  Will hold home amlodipine  Hydralazine  IV as needed for additional BP control  Ulcerative colitis (HCC) No acute issues suspected  Hyperthyroidism Pending med rec for verification of methimazole   History of TIA (transient ischemic attack) No acute issues suspected  History of sarcoma s/p left AKA No acute issues suspected     DVT prophylaxis: Full dose heparin   Consults: Vascular  Advance Care Planning:   Code Status: Prior   Family Communication: none***  Disposition Plan: Back to previous home environment  Severity of Illness: The appropriate patient status for this patient is INPATIENT. Inpatient status is judged to be reasonable and necessary in order to provide the required intensity of service to ensure the patient's safety. The patient's presenting symptoms, physical exam findings, and initial radiographic and laboratory data in the context of their chronic comorbidities is felt to place them at high risk for further clinical deterioration. Furthermore, it is not anticipated that the patient will be medically stable for discharge from the hospital within 2 midnights of admission.   * I certify that at the point of admission it is my clinical judgment that the patient will require inpatient hospital care spanning beyond 2 midnights from the point of admission due to high intensity of service, high risk for further deterioration and high frequency of surveillance required.*  Author: Delayne LULLA Solian, MD 02/09/2024 10:19 PM  For on call review [email or web address].

## 2024-02-09 NOTE — Assessment & Plan Note (Signed)
 Mildly elevated troponin Troponin 45, likely related to ischemia On aspirin , Zetia , rosuvastatin  and losartan  Continue to monitor

## 2024-02-09 NOTE — ED Notes (Signed)
 ED Provider at bedside.

## 2024-02-09 NOTE — ED Triage Notes (Signed)
 Pt to ED via POV from CT. Pt reports had a fall on 8/23 and has been having health decline since. Pt reports has been having increased SOB and started on PRN oxygen  on Monday. Pt seen in CT today for rule out PE. Pt PE + and placed on [place] for hypoxia. Pt also reports chest tightness.   Pt is on blood thinner from stents.

## 2024-02-09 NOTE — Assessment & Plan Note (Addendum)
 Acute respiratory failure with hypoxia Bilateral pleural effusions History of pelvic fracture from ground-level fall 01/21/2024 CT showing evidence of right heart strain and moderate bilateral pleural effusions Likely provoked PE related to decreased mobility from recent pelvic fracture, (does have remote history of sarcoma) Continue heparin  infusion Continue supplemental oxygen  Pain control Echocardiogram Prn IV lasix  trial for increased work of breathing related to cor pulmonale Formal vascular consult N.p.o. from midnight for possible vascular procedure in the a.m.

## 2024-02-09 NOTE — ED Provider Notes (Signed)
 [facility] Provider Note    Event Date/Time   First MD Initiated Contact with Patient 02/09/24 1900     (approximate)  History   Chief Complaint: Shortness of Breath  HPI  TALEEYA Page is a 83 y.o. female with a past medical history of gastric reflux, hypertension, hyperlipidemia, left lower extremity amputation at the hip, recent hospitalization for a fall/pelvis fracture presents to the emergency department for a blood clot.  According to the patient and family at the end of August patient was hospitalized at [facility] for a fall leading to a pelvic fracture.  Patient was then sent to a rehab facility where she is still at.  Over the last 3 days patient has had worsening shortness of breath denies any fever or cough.  They sent the patient for a CTA of the chest which resulted showing PE and the patient was sent to the emergency department.  Patient states continued shortness of breath, satting in the upper 80s on room air currently satting 93% on 2 L.  No baseline O2 requirement.  Physical Exam   Triage Vital Signs: ED Triage Vitals  Encounter Vitals Group     BP 02/09/24 1825 (!) 169/95     Girls Systolic BP Percentile --      Girls Diastolic BP Percentile --      Boys Systolic BP Percentile --      Boys Diastolic BP Percentile --      Pulse Rate 02/09/24 1825 (!) 55     Resp 02/09/24 1825 (!) 26     Temp 02/09/24 1825 98 F (36.7 C)     Temp Source 02/09/24 1825 Oral     SpO2 02/09/24 1825 93 %     Weight --      Height --      Head Circumference --      Peak Flow --      Pain Score 02/09/24 1826 5     Pain Loc --      Pain Education --      Exclude from Growth Chart --     Most recent vital signs: Vitals:   02/09/24 1825  BP: (!) 169/95  Pulse: (!) 55  Resp: (!) 26  Temp: 98 F (36.7 C)  SpO2: 93%    General: Awake, no distress.  CV:  Good peripheral perfusion.  Regular rate and rhythm  Resp:  Mild tachypnea.  Breath sounds equal  bilaterally with no wheeze rales or rhonchi. Abd:  No distention.  Soft, nontender.  Other:  Left lower extremity amputation at the hip (5 years ago per patient).  ED Results / Procedures / Treatments   EKG  EKG viewed and interpreted by myself shows sinus bradycardia 54 bpm with a narrow QRS, normal axis, normal intervals, nonspecific ST changes.  No ST elevation.  MEDICATIONS ORDERED IN ED: Medications - No data to display   IMPRESSION / MDM / ASSESSMENT AND PLAN / ED COURSE  I reviewed the triage vital signs and the nursing notes.  Patient's presentation is most consistent with acute presentation with potential threat to life or bodily function.  Patient presents to the emergency department for shortness of breath with an outpatient CTA positive for right sided PE.  I reviewed the CTA results it also shows some bilateral haziness possibly indicative of pneumonia patient denies any fever or cough but is at a rehab facility where they have noted recent COVID infections.  We will check  labs, cultures.  We will check a procalcitonin to evaluate for possible bacterial infection.  Will check a COVID/respiratory swab.  We will start the patient on a heparin  infusion.  Patient required mission to the hospital once her emergency department workup has been completed.  Patient's lab work today shows a reassuring CBC white blood cell count of 11.  Lactic acid is normal.  Respiratory panel is negative.  Patient's chemistry shows no significant finding.  Troponin slightly elevated at 36 likely explained by the PE.  However given the PE with signs of right heart strain and hypoxia I spoke to Dr. Jama of vascular surgery who states they will consult on the patient for a possible procedure tomorrow.  Will admit to the hospitalist service.  I do not see any other signs of any obvious infection, white blood cell count reassuring.  Lactic acid normal with no cough or fever.  Will hold off on antibiotics at  this time.  Procalcitonin is pending if this results elevated we may revise that decision.  CRITICAL CARE Performed by: Franky Moores   Total critical care time: 30 minutes  Critical care time was exclusive of separately billable procedures and treating other patients.  Critical care was necessary to treat or prevent imminent or life-threatening deterioration.  Critical care was time spent personally by me on the following activities: development of treatment plan with patient and/or surrogate as well as nursing, discussions with consultants, evaluation of patient's response to treatment, examination of patient, obtaining history from patient or surrogate, ordering and performing treatments and interventions, ordering and review of laboratory studies, ordering and review of radiographic studies, pulse oximetry and re-evaluation of patient's condition.   FINAL CLINICAL IMPRESSION(S) / ED DIAGNOSES   Dyspnea Pulmonary embolism  Note:  This document was prepared using Dragon voice recognition software and may include unintentional dictation errors.   Moores Franky, MD 02/09/24 2107

## 2024-02-10 ENCOUNTER — Telehealth (HOSPITAL_COMMUNITY): Payer: Self-pay | Admitting: Pharmacy Technician

## 2024-02-10 ENCOUNTER — Inpatient Hospital Stay

## 2024-02-10 ENCOUNTER — Inpatient Hospital Stay
Admit: 2024-02-10 | Discharge: 2024-02-10 | Disposition: A | Discharge status: Home / Self Care | Attending: Internal Medicine | Admitting: Internal Medicine

## 2024-02-10 ENCOUNTER — Other Ambulatory Visit (HOSPITAL_COMMUNITY): Payer: Self-pay

## 2024-02-10 ENCOUNTER — Encounter
Admission: EM | Disposition: A | Discharge status: Skilled Nursing Facility | Payer: Self-pay | Source: Ambulatory Visit | Attending: Student in an Organized Health Care Education/Training Program

## 2024-02-10 DIAGNOSIS — J90 Pleural effusion, not elsewhere classified: Secondary | ICD-10-CM

## 2024-02-10 DIAGNOSIS — Z7902 Long term (current) use of antithrombotics/antiplatelets: Secondary | ICD-10-CM

## 2024-02-10 DIAGNOSIS — Z87891 Personal history of nicotine dependence: Secondary | ICD-10-CM

## 2024-02-10 DIAGNOSIS — Z79899 Other long term (current) drug therapy: Secondary | ICD-10-CM

## 2024-02-10 DIAGNOSIS — Z7982 Long term (current) use of aspirin: Secondary | ICD-10-CM

## 2024-02-10 DIAGNOSIS — I2609 Other pulmonary embolism with acute cor pulmonale: Secondary | ICD-10-CM

## 2024-02-10 DIAGNOSIS — I2699 Other pulmonary embolism without acute cor pulmonale: Secondary | ICD-10-CM

## 2024-02-10 LAB — ECHOCARDIOGRAM COMPLETE
AR max vel: 1.23 cm2
AV Area VTI: 1.41 cm2
AV Area mean vel: 1.37 cm2
AV Mean grad: 28 mmHg
AV Peak grad: 53.1 mmHg
Ao pk vel: 3.64 m/s
Area-P 1/2: 5.5 cm2
Height: 60 in
S' Lateral: 2.6 cm
Weight: 2128 [oz_av]

## 2024-02-10 LAB — HEPARIN LEVEL (UNFRACTIONATED)
Heparin Unfractionated: >1.1 [IU]/mL — ABNORMAL HIGH (ref 0.30–0.70)
Heparin Unfractionated: 0.66 [IU]/mL (ref 0.30–0.70)
Heparin Unfractionated: 0.5 [IU]/mL (ref 0.30–0.70)

## 2024-02-10 LAB — MRSA NEXT GEN BY PCR, NASAL: MRSA by PCR Next Gen: NOT DETECTED

## 2024-02-10 SURGERY — PULMONARY THROMBECTOMY
Anesthesia: Moderate Sedation | Laterality: Bilateral

## 2024-02-10 MED ORDER — HEPARIN (PORCINE) 25000 UT/250ML-% IV SOLN
900.0000 [IU]/h | INTRAVENOUS | Status: DC
  Administered 2024-02-10: 800 [IU]/h via INTRAVENOUS
  Filled 2024-02-10 (×2): qty 250

## 2024-02-10 MED ORDER — ORAL CARE MOUTH RINSE: 15.0000 mL | OROMUCOSAL | Status: DC | PRN

## 2024-02-10 MED ORDER — IPRATROPIUM-ALBUTEROL 0.5-2.5 (3) MG/3ML IN SOLN
3.0000 mL | Freq: Once | RESPIRATORY_TRACT | Status: AC
  Administered 2024-02-10: 3 mL via RESPIRATORY_TRACT
  Filled 2024-02-10: qty 3

## 2024-02-10 NOTE — Telephone Encounter (Signed)
 Patient Product/process development scientist completed.    The patient is insured through [employer]. Patient has Medicare and is not eligible for a copay card, but may be able to apply for patient assistance or Medicare RX Payment Plan (Patient Must reach out to their plan, if eligible for payment plan), if available.    Ran test claim for Eliquis 5 mg and the current 30 day co-pay is $0.00.   This test claim was processed through [facility]- copay amounts may vary at other pharmacies due to pharmacy/plan contracts, or as the patient moves through the different stages of their insurance plan.     Roland Earl, CPHT Pharmacy Technician III Certified Patient Advocate [facility] Patient Advocate Team Direct Number: [phone]  Fax: [phone]

## 2024-02-10 NOTE — Progress Notes (Signed)
 Request for thoracentesis, patient with acute PE and need to be on heparin  infusion at this time. ultrasound imaging was obtained and revealed small bilateral pleural effusions with layering lung in the accessible percutaneous window therefore thoracentesis was not performed. Findings discussed with the patient today who verbalized understanding of the plan. Patient also states improved breathing today. Would recommend follow up CXR if breathing worsens or once patient is safely able to come off heparin  infusion for acute PE.   Electronically Signed: JOESPH VALDA BIRCH, [name] 02/10/2024, 12:12 PM

## 2024-02-10 NOTE — Assessment & Plan Note (Signed)
 Moderate on echo 2021 Loud murmur on exam F/u echo

## 2024-02-10 NOTE — Plan of Care (Signed)
  Problem: Education: Goal: Knowledge of General Education information will improve Description: Including pain rating scale, medication(s)/side effects and non-pharmacologic comfort measures Outcome: Progressing   Problem: Clinical Measurements: Goal: Ability to maintain clinical measurements within normal limits will improve Outcome: Progressing   Problem: Coping: Goal: Level of anxiety will decrease Outcome: Progressing   Problem: Elimination: Goal: Will not experience complications related to urinary retention Outcome: Progressing   Problem: Safety: Goal: Ability to remain free from injury will improve Outcome: Progressing   Problem: Clinical Measurements: Goal: Cardiovascular complication will be avoided Outcome: Not Progressing   Problem: Activity: Goal: Risk for activity intolerance will decrease Outcome: Not Progressing

## 2024-02-10 NOTE — Care Management Important Message (Signed)
 Important Message  Patient Details  Name: Misty Page MRN: [id number] Date of Birth: [birth year]   Important Message Given:  Yes - Medicare IM     Misty Page 02/10/2024, 2:01 PM

## 2024-02-10 NOTE — Consult Note (Signed)
 PHARMACY - ANTICOAGULATION CONSULT NOTE  Pharmacy Consult for Heparin  infusion Indication: pulmonary embolus  Allergies  Allergen Reactions   Amoxicillin Anaphylaxis   Cephalosporins Swelling and Rash    Lip swelling and rash on CTX and Vancomycin .   Naproxen Hives, Shortness Of Breath, Nausea And Vomiting and Swelling   Penicillins Shortness Of Breath, Swelling and Rash    Tolerated ceftriaxone  08/2022   Codeine Nausea And Vomiting   Levofloxacin Nausea And Vomiting   Shellfish Allergy Hives, Diarrhea, Nausea And Vomiting and Swelling   Vancomycin  Swelling and Rash    Occurred while on CTX and Vancomycin . More likely CTX as she has anaphylaxis to PCN, but should monitor closely.    Patient Measurements: Height: 5' (152.4 cm) Weight: 60.3 kg (133 lb) IBW/kg (Calculated) : 45.5 HEPARIN  DW (KG): 57.9  Vital Signs: Temp: 97.5 F (36.4 C) (09/11 2237) Temp Source: Axillary (09/11 2237) BP: 132/66 (09/12 0325) Pulse Rate: 40 (09/12 0325)  Labs: Recent Labs    02/09/24 1827 02/09/24 1914 02/09/24 2047 02/10/24 0351  HGB  --  11.8*  --   --   HCT  --  34.6*  --   --   PLT  --  467*  --   --   APTT  --  31  --   --   LABPROT  --  14.3  --   --   INR  --  1.0  --   --   HEPARINUNFRC  --   --   --  >1.10*  CREATININE  --  0.71  --   --   TROPONINIHS 36*  --  45*  --     Estimated Creatinine Clearance: 43.2 mL/min (by C-G formula based on SCr of 0.71 mg/dL).   Medical History: Past Medical History:  Diagnosis Date   Allergy    Anemia    Arthritis    CA of skin 10/15/2014   Calculus of kidney 10/15/2014   Seen in ER 06/16/10.    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   Coronary artery disease    Diverticulitis    Environmental and seasonal allergies    Fibrocystic breast disease (FCBD)    GERD (gastroesophageal reflux disease)    Heart murmur    High cholesterol    History of heart artery  stent    History of kidney stones    Hyperlipidemia    Hypertension    Hyperthyroidism    Myocardial infarction [facility])    Osteomyelitis of left femur (HCC) 11/30/2018   Osteoporosis    PONV (postoperative nausea and vomiting)    Reactive depression 02/25/2019   Thyroid  disease    TIA (transient ischemic attack)     Medications:  No AC prior to admission per med list and fill history  Assessment: Patient admitted with shortness of breath. PMH includes gastric reflux, hypertension, hyperlipidemia, left lower extremity amputation at the hip, recent hospitalization for a fall/pelvis fracture.   Pharmacy consulted to start and manage heparin  for acute PE.  Baseline labs:  Goal of Therapy:  Heparin  level 0.3-0.7 units/ml Monitor platelets by anticoagulation protocol: Yes   Plan:  9/12:  HL @ 0351 = > 1.10, elevated - spoke with lab tech who said sample was drawn from opposite arm as infusion so will take this result as valid - will hold heparin  infusion for 1 hr and restart at 800 units/hr - recheck HL  8 hrs after restart  Continue to monitor H&H and platelets  Misty Page D 02/10/2024 4:29 AM

## 2024-02-10 NOTE — Consult Note (Signed)
 PHARMACY - ANTICOAGULATION CONSULT NOTE  Pharmacy Consult for Heparin  infusion Indication: pulmonary embolus  Patient Measurements: Height: 5' (152.4 cm) Weight: 60.3 kg (133 lb) IBW/kg (Calculated) : 45.5 HEPARIN  DW (KG): 57.9  Labs: Recent Labs    02/09/24 1827 02/09/24 1914 02/09/24 2047 02/10/24 0351 02/10/24 1335  HGB  --  11.8*  --   --   --   HCT  --  34.6*  --   --   --   PLT  --  467*  --   --   --   APTT  --  31  --   --   --   LABPROT  --  14.3  --   --   --   INR  --  1.0  --   --   --   HEPARINUNFRC  --   --   --  >1.10* 0.66  CREATININE  --  0.71  --   --   --   TROPONINIHS 36*  --  45*  --   --     Estimated Creatinine Clearance: 43.2 mL/min (by C-G formula based on SCr of 0.71 mg/dL).   Medical History: Past Medical History:  Diagnosis Date   Allergy    Anemia    Arthritis    CA of skin 10/15/2014   Calculus of kidney 10/15/2014   Seen in ER 06/16/10.    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   Coronary artery disease    Diverticulitis    Environmental and seasonal allergies    Fibrocystic breast disease (FCBD)    GERD (gastroesophageal reflux disease)    Heart murmur    High cholesterol    History of heart artery stent    History of kidney stones    Hyperlipidemia    Hypertension    Hyperthyroidism    Myocardial infarction [facility])    Osteomyelitis of left femur (HCC) 11/30/2018   Osteoporosis    PONV (postoperative nausea and vomiting)    Reactive depression 02/25/2019   Thyroid  disease    TIA (transient ischemic attack)     Medications:  No AC prior to admission per med list and fill history  Assessment: Patient admitted with shortness of breath. PMH includes gastric reflux, hypertension, hyperlipidemia, left lower extremity amputation at the hip, recent hospitalization for a fall/pelvis fracture.   Pharmacy consulted to start and manage heparin  for acute  PE.  Goal of Therapy:  Heparin  level 0.3-0.7 units/ml Monitor platelets by anticoagulation protocol: Yes   Plan:  --Heparin  level is therapeutic x 1 --Continue heparin  infusion at 800 units/hr --Re-check confirmatory HL in 8 hours --Daily CBC per protocol while on IV heparin   Marolyn KATHEE Mare 02/10/2024 2:25 PM

## 2024-02-10 NOTE — Progress Notes (Signed)
 PROGRESS NOTE  Misty Page    DOB: 06/09/1940, 83 y.o.  FMW:[id number]    Code Status: Do not attempt resuscitation (DNR) PRE-ARREST INTERVENTIONS DESIRED   DOA: 02/09/2024   LOS: 1   Brief hospital course  Misty Page is a 83 y.o. female with a PMH significant for HTN, HLD, ulcerative colitis, CAD s/p PCI for prior MI, left AKA for sarcoma 2020, recently hospitalized at [facility] (8/23 to 01/30/2024) for multiple pelvic fractures related to ground-level fall, treated nonoperatively and discharged to rehab on DVT prophylaxis with ASA 81 mg twice daily, now being admitted with acute PE with hypoxic respiratory failure.  She developed shortness of breath a few days ago, and has been on O2 via nasal cannula since 9/8.  She subsequently developed chest pain with breathing.  Outpatient CTA chest showed small PE within segmental branches of right middle lobe with evidence of right heart strain (RV LV ratio 1.5) consistent with at least submassive PE.  Also showed moderate-sized bilateral pleural effusions and patchy airspace disease/cardiomegaly/pulmonary artery hypertension. On arrival to the ED, she was tachypneic to the mid 20s requiring 2 L O2 to maintain sats in the low to mid 90s.  BP mostly in the 180s/50s to 60s.EKG showed sinus bradycardia at 54 with nonspecific ST-T wave changes Labs notable for the following: Troponin 45, WBC 11, hemoglobin 11.8, potassium 3.3, negative respiratory viral panel   ED provider spoke with vascular surgeon Dr. Jama who recommended heparin  infusion and keeping patient n.p.o. for possible thrombectomy in the a.m..  02/10/24 -no thrombectomy planned. Continue anticoagulation.   Assessment & Plan  Principal Problem:   Acute pulmonary embolism with acute cor pulmonale (HCC) Active Problems:   Acute respiratory failure with hypoxia (HCC)   History of pelvic fracture 01/21/24   CAD (coronary artery disease)   Essential (primary) hypertension   Hyperthyroidism    Ulcerative colitis (HCC)   History of TIA (transient ischemic attack)   Moderate aortic stenosis   History of sarcoma s/p left AKA  Acute PE with acute cor pulmonale (HCC)- per vascular surgery, not amenable to thrombectomy. Treat with anticoagulation. No evidence of DVT Acute respiratory failure with hypoxia- currently stable on 4L Bilateral pleural effusions- per IR, not amenable to thoracentesis. Treat with lasix  - wean O2 as tolerated.  - continue heparin  gtt - lasix  scheduled IV - f/u echo  History of pelvic fracture 01/21/24- provoked PE likely from fracture/decreased mobility  Currently at [facility] for rehab. Treating conservatively.  Resume PT when appropriate   CAD (coronary artery disease) Mildly elevated troponin Troponin 45, likely related to ischemia On aspirin , Zetia , rosuvastatin  and losartan  Continue to monitor   Essential (primary) hypertension Continue losartan .  Will hold home amlodipine    Ulcerative colitis (HCC) No acute issues suspected   Hyperthyroidism Pending med rec for verification of methimazole    History of TIA (transient ischemic attack) No acute issues suspected   History of sarcoma s/p left AKA No acute issues suspected   Moderate aortic stenosis Moderate on echo 2021 Loud murmur on exam F/u echo  Body mass index is 25.97 kg/m.  VTE ppx: heparin  gtt  Diet:     Diet   Diet NPO time specified   Consultants: Vascular surgery IR  Subjective 02/10/24    Pt reports feeling much improved. Denies chest pain today. Mildly SOB   Objective  Blood pressure 133/85, pulse (!) 49, temperature 98.6 F (37 C), temperature source Oral, resp. rate 20, height 5' (1.524 m),  weight 60.3 kg, SpO2 93%.  Intake/Output Summary (Last 24 hours) at 02/10/2024 0819 Last data filed at 02/10/2024 0533 Gross per 24 hour  Intake 61.83 ml  Output 800 ml  Net -738.17 ml   Filed Weights   02/09/24 1916  Weight: 60.3 kg    Physical Exam:  General: awake,  alert, NAD HEENT: atraumatic, clear conjunctiva, anicteric sclera, MMM, hearing grossly normal Respiratory: normal respiratory effort. Cardiovascular: quick capillary refill, normal S1/S2, RRR, no JVD, murmurs Gastrointestinal: soft, NT, ND Nervous: A&O x3. no gross focal neurologic deficits, normal speech Extremities: moves all equally, no edema, normal tone Skin: dry, intact, normal temperature, normal color. No rashes, lesions or ulcers on exposed skin Psychiatry: normal mood, congruent affect  Labs   I have personally reviewed the following labs and imaging studies CBC    Component Value Date/Time   WBC 11.0 (H) 02/09/2024 1914   RBC 3.71 (L) 02/09/2024 1914   HGB 11.8 (L) 02/09/2024 1914   HGB 13.9 09/09/2023 1117   HCT 34.6 (L) 02/09/2024 1914   HCT 43.0 09/09/2023 1117   PLT 467 (H) 02/09/2024 1914   PLT 379 09/09/2023 1117   MCV 93.3 02/09/2024 1914   MCV 94 09/09/2023 1117   MCV 98 06/22/2014 0417   MCH 31.8 02/09/2024 1914   MCHC 34.1 02/09/2024 1914   RDW 14.5 02/09/2024 1914   RDW 13.4 09/09/2023 1117   RDW 14.5 06/22/2014 0417   LYMPHSABS 1.0 09/09/2023 1117   LYMPHSABS 1.4 06/22/2014 0417   MONOABS 0.5 03/23/2019 1514   MONOABS 0.7 06/22/2014 0417   EOSABS 0.2 09/09/2023 1117   EOSABS 0.0 06/22/2014 0417   BASOSABS 0.0 09/09/2023 1117   BASOSABS 0.0 06/22/2014 0417      Latest Ref Rng & Units 02/09/2024    7:14 PM 09/09/2023   11:17 AM 03/04/2023   11:36 AM  BMP  Glucose 70 - 99 mg/dL 877  883  887   BUN 8 - 23 mg/dL 23  15  15    Creatinine 0.44 - 1.00 mg/dL 9.28  9.42  9.52   BUN/Creat Ratio 12 - 28  26  32   Sodium 135 - 145 mmol/L 134  139  140   Potassium 3.5 - 5.1 mmol/L 3.3  4.2  3.9   Chloride 98 - 111 mmol/L 102  98  103   CO2 22 - 32 mmol/L 20  23  24    Calcium  8.9 - 10.3 mg/dL 8.3  9.8  9.3     CT Angio Chest Pulmonary Embolism (PE) W or WO Contrast Addendum Date: 02/09/2024 ADDENDUM REPORT: 02/09/2024 17:59 ADDENDUM: These results were  called by telephone at the time of interpretation on 02/09/2024 at 5:58 pm to provider Dr. Ammon, who verbally acknowledged these results. Electronically Signed   By: Greig Pique M.D.   On: 02/09/2024 17:59   Result Date: 02/09/2024 CLINICAL DATA:  SOB EXAM: CT ANGIOGRAPHY CHEST WITH CONTRAST TECHNIQUE: Multidetector CT imaging of the chest was performed using the standard protocol during bolus administration of intravenous contrast. Multiplanar CT image reconstructions and MIPs were obtained to evaluate the vascular anatomy. RADIATION DOSE REDUCTION: This exam was performed according to the departmental dose-optimization program which includes automated exposure control, adjustment of the mA and/or kV according to patient size and/or use of iterative reconstruction technique. CONTRAST:  75mL OMNIPAQUE  IOHEXOL  350 MG/ML SOLN COMPARISON:  CT PE protocol 02/14/2020. FINDINGS: Cardiovascular: There is adequate opacification of the pulmonary arteries to the segmental level.  There small pulmonary emboli within segmental branches of the right middle lobe. No other pulmonary emboli are identified. Main pulmonary artery is enlarged compatible with pulmonary artery hypertension. The heart is enlarged. There is no pericardial effusion. There are atherosclerotic calcifications of the aorta and coronary arteries. There is a vascular stent in the region of the right brachiocephalic artery. Mediastinum/Nodes: There are punctate calcifications in the thyroid  gland. There is a hypodense left thyroid  nodule measuring 6 mm. There are scattered nonenlarged mediastinal lymph nodes diffusely. Visualized esophagus within normal limits. Lungs/Pleura: There are moderate-sized bilateral pleural effusions. Mild emphysema present. There are are mild patchy multifocal airspace and ill-defined nodular densities throughout the bilateral upper lobes. There some atelectasis in the bilateral lower lobes and small amount of airspace disease  in the right lower lobe. Upper Abdomen: No acute abnormality. Musculoskeletal: Bilateral breast implants are present. No acute osseous abnormality. Stable mild compression deformity of L2 Review of the MIP images confirms the above findings. IMPRESSION: 1. Small pulmonary emboli within segmental branches of the right middle lobe. Positive for acute PE with CTevidence of right heart strain (RV/LV Ratio = 1.5) consistent with at least submassive (intermediate risk) PE. The presence of right heart strain has been associated with an increased risk of morbidity and mortality. 2. Moderate-sized bilateral pleural effusions. 3. Mild patchy airspace and ill-defined nodular densities in the bilateral upper lobes and right lower lobe worrisome for multifocal pneumonia. 4. Cardiomegaly. 5. Findings compatible with pulmonary artery hypertension. Aortic Atherosclerosis (ICD10-I70.0) and Emphysema (ICD10-J43.9). Electronically Signed: By: Greig Pique M.D. On: 02/09/2024 17:47    Disposition Plan & Communication  Patient status: Inpatient  Admitted From: SNF Planned disposition location: Skilled nursing facility Anticipated discharge date: 9/13 pending clinical stability   Family Communication: daughter on phone     Author: Marien LITTIE Piety, DO [facility] 02/10/2024, 8:19 AM   Available by Epic secure chat 7AM-7PM. If 7PM-7AM, please contact night-coverage.  [facility] contact information found on [email or web address].

## 2024-02-10 NOTE — Consult Note (Signed)
 PHARMACY - ANTICOAGULATION CONSULT NOTE  Pharmacy Consult for Heparin  infusion Indication: pulmonary embolus  Patient Measurements: Height: 5' (152.4 cm) Weight: 60.3 kg (133 lb) IBW/kg (Calculated) : 45.5 HEPARIN  DW (KG): 57.9  Labs: Recent Labs    02/09/24 1827 02/09/24 1914 02/09/24 2047 02/10/24 0351 02/10/24 1335 02/10/24 2127  HGB  --  11.8*  --   --   --   --   HCT  --  34.6*  --   --   --   --   PLT  --  467*  --   --   --   --   APTT  --  31  --   --   --   --   LABPROT  --  14.3  --   --   --   --   INR  --  1.0  --   --   --   --   HEPARINUNFRC  --   --   --  >1.10* 0.66 0.50  CREATININE  --  0.71  --   --   --   --   TROPONINIHS 36*  --  45*  --   --   --     Estimated Creatinine Clearance: 43.2 mL/min (by C-G formula based on SCr of 0.71 mg/dL).   Medical History: Past Medical History:  Diagnosis Date   Allergy    Anemia    Arthritis    CA of skin 10/15/2014   Calculus of kidney 10/15/2014   Seen in ER 06/16/10.    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   Coronary artery disease    Diverticulitis    Environmental and seasonal allergies    Fibrocystic breast disease (FCBD)    GERD (gastroesophageal reflux disease)    Heart murmur    High cholesterol    History of heart artery stent    History of kidney stones    Hyperlipidemia    Hypertension    Hyperthyroidism    Myocardial infarction [employer])    Osteomyelitis of left femur (HCC) 11/30/2018   Osteoporosis    PONV (postoperative nausea and vomiting)    Reactive depression 02/25/2019   Thyroid  disease    TIA (transient ischemic attack)     Medications:  No AC prior to admission per med list and fill history  Assessment: Patient admitted with shortness of breath. PMH includes gastric reflux, hypertension, hyperlipidemia, left lower extremity amputation at the hip, recent hospitalization for a fall/pelvis fracture.    Pharmacy consulted to start and manage heparin  for acute PE.  Goal of Therapy:  Heparin  level 0.3-0.7 units/ml Monitor platelets by anticoagulation protocol: Yes   Plan:  9/12:  HL @ 2127 = 0.50, therapeutic X 2 - will continue pt on current rate and recheck HL on 9/13 with AM labs  --Daily CBC per protocol while on IV heparin   Hanya Guerin D 02/10/2024 9:57 PM

## 2024-02-10 NOTE — Plan of Care (Signed)

## 2024-02-10 NOTE — Consult Note (Signed)
 Hospital Consult    Reason for Consult:  Pulmonary embolism Requesting Physician:  Dr Delayne Solian MD  MRN #:  [id number]  History of Present Illness: This is a 83 y.o. female  with medical history significant for HTN, HLD, ulcerative colitis, CAD s/p PCI for prior MI, left AKA for sarcoma 2020, recently hospitalized at [facility] (8/23 to 01/30/2024) for multiple pelvic fractures related to ground-level fall, treated nonoperatively and discharged to rehab on DVT prophylaxis with ASA 81 mg twice daily, now being admitted with acute PE with hypoxic respiratory failure.  She developed shortness of breath a few days ago, and has been on O2 via nasal cannula since 9/8.  She subsequently developed chest pain with breathing.  Outpatient CTA chest showed small PE within segmental branches of right middle lobe with evidence of right heart strain (RV LV ratio 1.5) consistent with at least submassive PE.  Also showed moderate-sized bilateral pleural effusions and patchy airspace disease/cardiomegaly/pulmonary artery hypertension.   On exam this morning patient was resting comfortably in bed in ICU.  She has a heparin  infusion.  Patient was slightly tachycardic and on supplemental oxygen  at 6 L.  She was having a difficult time taking a deep breath.  Oxygen  saturation with oxygen  support was 94%.  No other complaints.  Vitals all remained stable.  Vascular surgery consulted to evaluate for pulmonary embolism.  Past Medical History:  Diagnosis Date   Allergy    Anemia    Arthritis    CA of skin 10/15/2014   Calculus of kidney 10/15/2014   Seen in ER 06/16/10.    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   Coronary artery disease    Diverticulitis    Environmental and seasonal allergies    Fibrocystic breast disease (FCBD)    GERD (gastroesophageal reflux disease)    Heart murmur    High cholesterol    History of heart artery stent    History of  kidney stones    Hyperlipidemia    Hypertension    Hyperthyroidism    Myocardial infarction (HCC)    Osteomyelitis of left femur (HCC) 11/30/2018   Osteoporosis    PONV (postoperative nausea and vomiting)    Reactive depression 02/25/2019   Thyroid  disease    TIA (transient ischemic attack)     Past Surgical History:  Procedure Laterality Date   ABDOMINAL HYSTERECTOMY  2007   BREAST ENHANCEMENT SURGERY  2004   BREAST IMPLANT REMOVAL     BREAST SURGERY  1984   Fibrocystic Disease   CAROTID ENDARTERECTOMY Left 06/21/2014   Dr. Jama   CAROTID ENDARTERECTOMY Right 08/02/2014   Dr. Jama   CAROTID PTA/STENT INTERVENTION N/A 06/15/2017   Procedure: CAROTID PTA/STENT INTERVENTION;  Surgeon: Jama Cordella MATSU, MD;  Location: [facility] INVASIVE CV LAB;  Service: Cardiovascular;  Laterality: N/A;   COLONOSCOPY WITH PROPOFOL  N/A 07/14/2015   Procedure: COLONOSCOPY WITH PROPOFOL ;  Surgeon: Deward CINDERELLA Piedmont, MD;  Location: [facility] ENDOSCOPY;  Service: Gastroenterology;  Laterality: N/A;   COLONOSCOPY WITH PROPOFOL  N/A 02/08/2018   Procedure: COLONOSCOPY WITH PROPOFOL ;  Surgeon: Toledo, Ladell POUR, MD;  Location: [facility] ENDOSCOPY;  Service: Gastroenterology;  Laterality: N/A;   CORONARY ANGIOPLASTY WITH STENT PLACEMENT  1999   CYSTOSCOPY WITH STENT PLACEMENT Right 09/20/2022   Procedure: CYSTOSCOPY WITH STENT PLACEMENT;  Surgeon: Penne Knee, MD;  Location: [facility] ORS;  Service: Urology;  Laterality: Right;   CYSTOSCOPY/URETEROSCOPY/HOLMIUM LASER/STENT PLACEMENT Right  10/07/2022   Procedure: CYSTOSCOPY/URETEROSCOPY/HOLMIUM LASER/STENT PLACEMENT;  Surgeon: Penne Knee, MD;  Location: [facility] ORS;  Service: Urology;  Laterality: Right;   ESOPHAGOGASTRODUODENOSCOPY (EGD) WITH PROPOFOL  N/A 02/08/2018   Procedure: ESOPHAGOGASTRODUODENOSCOPY (EGD) WITH PROPOFOL ;  Surgeon: Toledo, Ladell POUR, MD;  Location: [facility] ENDOSCOPY;  Service: Gastroenterology;  Laterality: N/A;   FEMORAL ARTERY STENT  08/2003   HERNIA REPAIR   2008   LEG AMPUTATION AT HIP Left 02/28/2019   MASTECTOMY     RENAL ARTERY STENT  08/2003   TRANSUMBILICAL AUGMENTATION MAMMAPLASTY     VASCULAR SURGERY     VISCERAL ARTERY INTERVENTION N/A 02/13/2020   Procedure: VISCERAL ARTERY INTERVENTION;  Surgeon: Marea Selinda RAMAN, MD;  Location: [facility] INVASIVE CV LAB;  Service: Cardiovascular;  Laterality: N/A;    Allergies  Allergen Reactions   Amoxicillin Anaphylaxis   Cephalosporins Swelling and Rash    Lip swelling and rash on CTX and Vancomycin .   Naproxen Hives, Shortness Of Breath, Nausea And Vomiting and Swelling   Penicillins Shortness Of Breath, Swelling and Rash    Tolerated ceftriaxone  08/2022   Codeine Nausea And Vomiting   Levofloxacin Nausea And Vomiting   Shellfish Allergy Hives, Diarrhea, Nausea And Vomiting and Swelling   Vancomycin  Swelling and Rash    Occurred while on CTX and Vancomycin . More likely CTX as she has anaphylaxis to PCN, but should monitor closely.    Prior to Admission medications   Medication Sig Start Date End Date Taking? Authorizing Provider  acetaminophen  (TYLENOL ) 500 MG tablet Take 500-1,000 mg by mouth every 8 (eight) hours as needed.    [provider]  amLODipine  (NORVASC ) 5 MG tablet TAKE 1 TABLET BY MOUTH DAILY 06/30/23   [name], Angela M, MD  Ascorbic Acid  (VITAMIN C ) 1000 MG tablet TAKE 1 TABLET BY MOUTH EVERY MORNING 01/24/24   [name], MD  aspirin  EC 81 MG tablet Take 81 mg by mouth daily. Swallow whole.    [provider]  Bacillus Coagulans-Inulin (PROBIOTIC-PREBIOTIC) 1-250 BILLION-MG CAPS TAKE 1 CAPSULE BY MOUTH DAILY 11/29/23   [name], Angela M, MD  CALCIUM +D3 600-20 MG-MCG TABS TAKE 1 TABLET BY MOUTH DAILY 06/10/23   [name], Angela M, MD  clopidogrel  (PLAVIX ) 75 MG tablet TAKE 1 TABLET BY MOUTH DAILY 09/05/23   [name], Angela M, MD  ezetimibe  (ZETIA ) 10 MG tablet TAKE 1 TABLET BY MOUTH DAILY 12/26/23   [name], Angela M, MD  famotidine  (PEPCID ) 20 MG  tablet TAKE 1 TABLET BY MOUTH TWICE A DAY 12/26/23   [name], Angela M, MD  fluticasone  (FLONASE ) 50 MCG/ACT nasal spray USE 2 SPRAYS EACH NOSTRIL DAILY 08/21/21   [name], Angela M, MD  gabapentin  (NEURONTIN ) 300 MG capsule TAKE ONE CAPSULE BY MOUTH TWICE A DAY 12/01/23   [name], [name], MD  HYDROcodone -acetaminophen  (NORCO/VICODIN) 5-325 MG tablet Take 1-2 tablets by mouth every 6 (six) hours as needed for moderate pain. 10/07/22   Penne Knee, MD  iron  polysaccharides (NIFEREX) 150 MG capsule Take 1 capsule (150 mg total) by mouth daily. 11/26/22   [name], Angela M, MD  losartan  (COZAAR ) 100 MG tablet TAKE 1 TABLET BY MOUTH DAILY 09/05/23   [name], Angela M, MD  meclizine  (ANTIVERT ) 25 MG tablet Take 1 tablet (25 mg total) by mouth 3 (three) times daily as needed for dizziness or nausea. Patient not taking: Reported on 12/28/2023 04/12/21   Viviann Pastor, MD  Mesalamine  800 MG TBEC Take 800 mg by mouth in the morning, at noon, and at bedtime.  09/26/17   [provider]  methimazole  (TAPAZOLE ) 5 MG tablet Take 5 mg by mouth. 1 Tablet on Mondays, Wednesdays and Fridays and take 1/2 tablet daily the rest of the week. 03/30/17   [provider]  Probiotic Product (PROBIOTIC DAILY PO) Take 1 capsule by mouth daily.    [provider]  rosuvastatin  (CRESTOR ) 10 MG tablet TAKE 1 TABLET BY MOUTH DAILY 12/26/23   [name], Angela M, MD  rosuvastatin  (CRESTOR ) 20 MG tablet TAKE 1/2 TABLET BY MOUTH DAILY 06/30/23   [name], MD    Social History   Socioeconomic History   Marital status: Widowed    Spouse name: Madeleine   Number of children: 1   Years of education: Not on file   Highest education level: 12th grade  Occupational History   Occupation: Producer, television/film/video    Comment: retired  Tobacco Use   Smoking status: Former    Current packs/day: 0.00    Types: Cigarettes    Quit date: 05/30/1994    Years since quitting: 29.7   Smokeless tobacco:  Never  Vaping Use   Vaping status: Never Used  Substance and Sexual Activity   Alcohol use: Not Currently    Comment: rare- 1 glass of wine EOW   Drug use: No   Sexual activity: Not Currently    Birth control/protection: None  Other Topics Concern   Not on file  Social History Narrative   Lives alone; gets assistance 3x/week-hired help   Social Drivers of Health   Financial Resource Strain: Low Risk  (01/22/2024)   Received from [facility]   Overall Financial Resource Strain (CARDIA)    How hard is it for you to pay for the very basics like food, housing, medical care, and heating?: Not hard at all  Food Insecurity: No Food Insecurity (02/09/2024)   Hunger Vital Sign    Worried About Running Out of Food in the Last Year: Never true    Ran Out of Food in the Last Year: Never true  Transportation Needs: No Transportation Needs (02/09/2024)   PRAPARE - Administrator, Civil Service (Medical): No    Lack of Transportation (Non-Medical): No  Physical Activity: Inactive (12/28/2023)   Exercise Vital Sign    Days of Exercise per Week: 0 days    Minutes of Exercise per Session: 0 min  Stress: No Stress Concern Present (12/28/2023)   [employer] of Occupational Health - Occupational Stress Questionnaire    Feeling of Stress: Not at all  Social Connections: Unknown (02/09/2024)   Social Connection and Isolation Panel    Frequency of Communication with Friends and Family: More than three times a week    Frequency of Social Gatherings with Friends and Family: Once a week    Attends Religious Services: Never    Database administrator or Organizations: No    Attends Engineer, structural: Patient declined    Marital Status: Not on file  Recent Concern: Social Connections - Socially Isolated (12/28/2023)   Social Connection and Isolation Panel    Frequency of Communication with Friends and Family: More than three times a week    Frequency of Social Gatherings  with Friends and Family: Never    Attends Religious Services: Never    Database administrator or Organizations: No    Attends Banker Meetings: Never    Marital Status: Widowed  Intimate Partner Violence: Not At Risk (02/09/2024)  Humiliation, Afraid, Rape, and Kick questionnaire    Fear of Current or Ex-Partner: No    Emotionally Abused: No    Physically Abused: No    Sexually Abused: No     Family History  Problem Relation Age of Onset   Hyperlipidemia Mother    Congestive Heart Failure Mother    COPD Mother    Heart disease Mother    Hypertension Mother    Osteoporosis Mother    Pancreatic cancer Father    Arthritis Sister    Alzheimer's disease Sister    Hypertension Sister    Prostate cancer Brother    Heart attack Brother    Stomach cancer Brother    Kidney failure Brother    Leukemia Brother    Emphysema Brother    Pancreatic cancer Brother    Stomach cancer Brother    Breast cancer Paternal Grandmother    Leukemia Paternal Grandfather    Heart attack Maternal Uncle    Stroke Maternal Aunt     ROS: Otherwise negative unless mentioned in HPI  Physical Examination  Vitals:   02/10/24 0600 02/10/24 0700  BP: (!) 112/46 133/85  Pulse: 80 (!) 49  Resp: 17 20  Temp:    SpO2: 94% 93%   Body mass index is 25.97 kg/m.  General:  WDWN in NAD Gait: Not observed HENT: WNL, normocephalic Pulmonary: normal non-labored breathing, without Rales, rhonchi,  wheezing Cardiac: regular, without  Murmurs, rubs or gallops; without carotid bruits Abdomen: Positive bowel sounds throughout,  soft, NT/ND, no masses Skin: without rashes Vascular Exam/Pulses: Palpable pulses throughout. All extremities are warm to touch. History of Left AKA Extremities: without ischemic changes, without Gangrene , without cellulitis; without open wounds;  Musculoskeletal: no muscle wasting or atrophy  Neurologic: A&O X 3;  No focal weakness or paresthesias are detected; speech  is fluent/normal Psychiatric:  The pt has Normal affect. Lymph:  Unremarkable  CBC    Component Value Date/Time   WBC 11.0 (H) 02/09/2024 1914   RBC 3.71 (L) 02/09/2024 1914   HGB 11.8 (L) 02/09/2024 1914   HGB 13.9 09/09/2023 1117   HCT 34.6 (L) 02/09/2024 1914   HCT 43.0 09/09/2023 1117   PLT 467 (H) 02/09/2024 1914   PLT 379 09/09/2023 1117   MCV 93.3 02/09/2024 1914   MCV 94 09/09/2023 1117   MCV 98 06/22/2014 0417   MCH 31.8 02/09/2024 1914   MCHC 34.1 02/09/2024 1914   RDW 14.5 02/09/2024 1914   RDW 13.4 09/09/2023 1117   RDW 14.5 06/22/2014 0417   LYMPHSABS 1.0 09/09/2023 1117   LYMPHSABS 1.4 06/22/2014 0417   MONOABS 0.5 03/23/2019 1514   MONOABS 0.7 06/22/2014 0417   EOSABS 0.2 09/09/2023 1117   EOSABS 0.0 06/22/2014 0417   BASOSABS 0.0 09/09/2023 1117   BASOSABS 0.0 06/22/2014 0417    BMET    Component Value Date/Time   NA 134 (L) 02/09/2024 1914   NA 139 09/09/2023 1117   NA 140 06/22/2014 0417   K 3.3 (L) 02/09/2024 1914   K 3.5 06/22/2014 0417   CL 102 02/09/2024 1914   CL 105 06/22/2014 0417   CO2 20 (L) 02/09/2024 1914   CO2 27 06/22/2014 0417   GLUCOSE 122 (H) 02/09/2024 1914   GLUCOSE 92 06/22/2014 0417   BUN 23 02/09/2024 1914   BUN 15 09/09/2023 1117   BUN 9 06/22/2014 0417   CREATININE 0.71 02/09/2024 1914   CREATININE 0.65 06/22/2014 0417   CALCIUM  8.3 (  L) 02/09/2024 1914   CALCIUM  7.7 (L) 06/22/2014 0417   GFRNONAA >60 02/09/2024 1914   GFRNONAA >60 06/22/2014 0417   GFRNONAA >60 02/26/2013 1510   GFRAA 107 04/28/2020 1143   GFRAA >60 06/22/2014 0417   GFRAA >60 02/26/2013 1510    COAGS: Lab Results  Component Value Date   INR 1.0 02/09/2024   INR 1.1 09/19/2022   INR 0.8 02/12/2020     Non-Invasive Vascular Imaging:   EXAM: CT ANGIOGRAPHY CHEST WITH CONTRAST   TECHNIQUE: Multidetector CT imaging of the chest was performed using the standard protocol during bolus administration of intravenous contrast. Multiplanar CT  image reconstructions and MIPs were obtained to evaluate the vascular anatomy.   RADIATION DOSE REDUCTION: This exam was performed according to the departmental dose-optimization program which includes automated exposure control, adjustment of the mA and/or kV according to patient size and/or use of iterative reconstruction technique.   CONTRAST:  75mL OMNIPAQUE  IOHEXOL  350 MG/ML SOLN   COMPARISON:  CT PE protocol 02/14/2020.   FINDINGS: Cardiovascular: There is adequate opacification of the pulmonary arteries to the segmental level. There small pulmonary emboli within segmental branches of the right middle lobe. No other pulmonary emboli are identified. Main pulmonary artery is enlarged compatible with pulmonary artery hypertension. The heart is enlarged. There is no pericardial effusion. There are atherosclerotic calcifications of the aorta and coronary arteries. There is a vascular stent in the region of the right brachiocephalic artery.   Mediastinum/Nodes: There are punctate calcifications in the thyroid  gland. There is a hypodense left thyroid  nodule measuring 6 mm. There are scattered nonenlarged mediastinal lymph nodes diffusely. Visualized esophagus within normal limits.   Lungs/Pleura: There are moderate-sized bilateral pleural effusions. Mild emphysema present. There are are mild patchy multifocal airspace and ill-defined nodular densities throughout the bilateral upper lobes. There some atelectasis in the bilateral lower lobes and small amount of airspace disease in the right lower lobe.   Upper Abdomen: No acute abnormality.   Musculoskeletal: Bilateral breast implants are present. No acute osseous abnormality. Stable mild compression deformity of L2   Review of the MIP images confirms the above findings.   IMPRESSION: 1. Small pulmonary emboli within segmental branches of the right middle lobe. Positive for acute PE with CTevidence of right heart strain (RV/LV  Ratio = 1.5) consistent with at least submassive (intermediate risk) PE. The presence of right heart strain has been associated with an increased risk of morbidity and mortality. 2. Moderate-sized bilateral pleural effusions. 3. Mild patchy airspace and ill-defined nodular densities in the bilateral upper lobes and right lower lobe worrisome for multifocal pneumonia. 4. Cardiomegaly. 5. Findings compatible with pulmonary artery hypertension.   Aortic Atherosclerosis (ICD10-I70.0) and Emphysema (ICD10-J43.9).    Statin:  Yes.   Beta Blocker:  Yes.   Aspirin :  Yes.   ACEI:  No. ARB:  Yes.   CCB use:  Yes Other antiplatelets/anticoagulants:  Yes.   Plavix  75 mg daily    ASSESSMENT/PLAN: This is a 83 y.o. female who presents to [facility] emergency department due to increasing shortness of breath and chest pain after a prior hospitalization at [facility] from 01/21/2024 to 02/18/2024 for multiple pelvic fractures.  This apparently was a ground-level fall that was treated nonoperatively and she was discharged to rehab on DVT prophylaxis of aspirin  81 mg twice daily.  Upon workup patient underwent a CTA which showed pulmonary embolism with segmental branches of the right middle lobe with evidence of right heart strain and a ratio  of RV to LV 1.5. She is also noted to have bilateral pleural effusions with patchy airspace pulmonary-artery hypertension.  After reviewing the CT Scan with Dr. Cordella Shawl MD there was very little clot in the segmental branches of the right middle.  Therefore the risk of doing a pulmonary thrombectomy is highly elevated versus placing the patient on anticoagulation only.  Therefore vascular surgery recommends the patient be on aspirin  81 mg daily and Eliquis  5 mg twice daily.  If the patient needs further evaluation with procedures it would be prudent for the patient to remain on a heparin  infusion.  No procedure is advised at this time.   -I discussed the case in detail with  Dr. Cordella Shawl MD and he agrees with the plan.   Gwendlyn JONELLE Shank [facility] 02/10/2024 7:24 AM

## 2024-02-10 NOTE — Progress Notes (Signed)
*  PRELIMINARY RESULTS* Echocardiogram 2D Echocardiogram has been performed.  Misty Page 02/10/2024, 11:08 AM

## 2024-02-11 DIAGNOSIS — I2609 Other pulmonary embolism with acute cor pulmonale: Secondary | ICD-10-CM | POA: Diagnosis not present

## 2024-02-11 LAB — CBC
HCT: 31.5 % — ABNORMAL LOW (ref 36.0–46.0)
Hemoglobin: 10.7 g/dL — ABNORMAL LOW (ref 12.0–15.0)
MCH: 31.5 pg (ref 26.0–34.0)
MCHC: 34 g/dL (ref 30.0–36.0)
MCV: 92.6 fL (ref 80.0–100.0)
Platelets: 442 K/uL — ABNORMAL HIGH (ref 150–400)
RBC: 3.4 MIL/uL — ABNORMAL LOW (ref 3.87–5.11)
RDW: 14.6 % (ref 11.5–15.5)
WBC: 8.7 K/uL (ref 4.0–10.5)
nRBC: 0 % (ref 0.0–0.2)

## 2024-02-11 LAB — HEPARIN LEVEL (UNFRACTIONATED): Heparin Unfractionated: 0.41 [IU]/mL (ref 0.30–0.70)

## 2024-02-11 MED ORDER — METHIMAZOLE 5 MG PO TABS
5.0000 mg | ORAL_TABLET | ORAL | Status: DC
  Administered 2024-02-13: 5 mg via ORAL
  Filled 2024-02-11: qty 1

## 2024-02-11 MED ORDER — FUROSEMIDE 10 MG/ML IJ SOLN
40.0000 mg | Freq: Two times a day (BID) | INTRAMUSCULAR | Status: AC
  Administered 2024-02-11 – 2024-02-12 (×4): 40 mg via INTRAVENOUS
  Filled 2024-02-11 (×4): qty 4

## 2024-02-11 MED ORDER — HYDRALAZINE HCL 20 MG/ML IJ SOLN
2.0000 mg | Freq: Four times a day (QID) | INTRAMUSCULAR | Status: DC
  Administered 2024-02-11 – 2024-02-12 (×4): 2 mg via INTRAVENOUS
  Filled 2024-02-11 (×4): qty 1

## 2024-02-11 MED ORDER — METHIMAZOLE 5 MG PO TABS: 5.0000 mg | ORAL_TABLET | Freq: Every day | ORAL | Status: DC

## 2024-02-11 MED ORDER — METHIMAZOLE 2.5 MG HALF TABLET
2.5000 mg | ORAL_TABLET | ORAL | Status: DC
  Administered 2024-02-11 – 2024-02-14 (×3): 2.5 mg via ORAL
  Filled 2024-02-11 (×3): qty 1

## 2024-02-11 MED ORDER — MESALAMINE 400 MG PO CPDR
800.0000 mg | DELAYED_RELEASE_CAPSULE | Freq: Three times a day (TID) | ORAL | Status: DC
  Administered 2024-02-11 – 2024-02-14 (×10): 800 mg via ORAL
  Filled 2024-02-11 (×11): qty 2

## 2024-02-11 MED ORDER — AMLODIPINE BESYLATE 5 MG PO TABS
5.0000 mg | ORAL_TABLET | Freq: Every day | ORAL | Status: DC
Start: 2024-02-11 — End: 2024-02-13
  Administered 2024-02-11 – 2024-02-12 (×2): 5 mg via ORAL
  Filled 2024-02-11 (×2): qty 1

## 2024-02-11 NOTE — Evaluation (Signed)
 Physical Therapy Evaluation Patient Details Name: Misty Page MRN: [id number] DOB: August 19, 1940 Today's Date: 02/11/2024  History of Present Illness  Pt is an 83 y/o F presenting to ED from SNF with c/o SOB. Imaging + for PE. Recent admission (8/23-9/1) at [facility] following a fall resulting in pelvic fx. PMH significant for LLE AKA, HTN, gastric reflux, HLD, UC, CAD.  Clinical Impression  Pt A&Ox4, agreeable to PT evaluation. Pt cited soreness in her pelvis during session, no specific number rating provided. Per pt, since recent SNF admission she has been primarily using manual WC for mobility with supervision. Pt required assistance from PCA for ADLs such as bathing and cooking prior to admission. Pt was met seated in recliner, able to complete step/hop pivot from recliner to bed with CGA and unilateral UE support via HHA or bed rail assist. Pt able to return to supine with supervision. Pt was left supine in bed, all needs in reach. Pt would benefit from skilled PT intervention to address listed deficits (see PT problem list) and improve independence with mobility/ADLs.         If plan is discharge home, recommend the following: A little help with walking and/or transfers;A lot of help with bathing/dressing/bathroom;Assistance with cooking/housework;Assist for transportation;Help with stairs or ramp for entrance   Can travel by private vehicle        Equipment Recommendations None recommended by PT (pt has all recommended DME)  Recommendations for Other Services       Functional Status Assessment Patient has had a recent decline in their functional status and demonstrates the ability to make significant improvements in function in a reasonable and predictable amount of time.     Precautions / Restrictions Precautions Precautions: Fall Recall of Precautions/Restrictions: Intact Restrictions Weight Bearing Restrictions Per Provider Order: No      Mobility  Bed Mobility Overal bed  mobility: Needs Assistance Bed Mobility: Sit to Supine       Sit to supine: Supervision   General bed mobility comments: no physical assistance required    Transfers Overall transfer level: Needs assistance Equipment used: 1 person hand held assist Transfers: Bed to chair/wheelchair/BSC     Step pivot transfers: Contact guard assist       General transfer comment: Pt able to step/hop pivot from recliner to bed to her R side with CGA, tendency to reach for UE support on bed rail/unilateral HHA to complete transfer    Ambulation/Gait                  Stairs            Wheelchair Mobility     Tilt Bed    Modified Rankin (Stroke Patients Only)       Balance Overall balance assessment: Needs assistance Sitting-balance support: Feet supported Sitting balance-Leahy Scale: Good     Standing balance support: Single extremity supported Standing balance-Leahy Scale: Fair Standing balance comment: requires at least unilateral UE support in standing to maintain balance                             Pertinent Vitals/Pain Pain Assessment Pain Assessment: No/denies pain    Home Living Family/patient expects to be discharged to:: Private residence Living Arrangements: Alone Available Help at Discharge: Personal care attendant (comes 3 days a week, 4 hours each time) Type of Home: House Home Access: Ramped entrance       Home Layout: One level  Home Equipment: Tub bench;Grab bars - toilet;Grab bars - tub/shower;Hand held shower head;Wheelchair - Forensic psychologist (2 wheels);BSC/3in1 Additional Comments: pt states that she can have her PCA come an additional day a week if needed.    Prior Function Prior Level of Function : Needs assist             Mobility Comments: Pt states that she has primarily been using manual WC for mobility at SNF, able to hop with RW or stand and pivot to WC/toilet with supervision. States she is not interested  in getting a prosthesis for LLE. ADLs Comments: PCA helps with ADLs like cooking and bathing when she comes.     Extremity/Trunk Assessment        Lower Extremity Assessment Lower Extremity Assessment: RLE deficits/detail RLE Deficits / Details: strength grossly 4-/5       Communication   Communication Communication: No apparent difficulties    Cognition Arousal: Alert Behavior During Therapy: WFL for tasks assessed/performed   PT - Cognitive impairments: No apparent impairments                       PT - Cognition Comments: A&Ox4, pleasant Following commands: Intact       Cueing Cueing Techniques: Verbal cues     General Comments      Exercises     Assessment/Plan    PT Assessment Patient needs continued PT services  PT Problem List Decreased strength;Decreased activity tolerance;Decreased balance;Decreased mobility       PT Treatment Interventions DME instruction;Functional mobility training;Therapeutic activities;Therapeutic exercise;Balance training;Neuromuscular re-education;Patient/family education;Wheelchair mobility training;Gait training    PT Goals (Current goals can be found in the Care Plan section)  Acute Rehab PT Goals Patient Stated Goal: to go home PT Goal Formulation: With patient Time For Goal Achievement: 02/25/24 Potential to Achieve Goals: Fair    Frequency Min 2X/week     Co-evaluation               AM-PAC PT 6 Clicks Mobility  Outcome Measure Help needed turning from your back to your side while in a flat bed without using bedrails?: None Help needed moving from lying on your back to sitting on the side of a flat bed without using bedrails?: A Little Help needed moving to and from a bed to a chair (including a wheelchair)?: A Little Help needed standing up from a chair using your arms (e.g., wheelchair or bedside chair)?: None Help needed to walk in hospital room?: A Lot Help needed climbing 3-5 steps with a  railing? : Total 6 Click Score: 17    End of Session Equipment Utilized During Treatment: Oxygen  Activity Tolerance: Patient tolerated treatment well Patient left: in bed;with call bell/phone within reach;with bed alarm set Nurse Communication: Mobility status PT Visit Diagnosis: Other abnormalities of gait and mobility (R26.89);Muscle weakness (generalized) (M62.81);Difficulty in walking, not elsewhere classified (R26.2)    Time: 8543-8484 PT Time Calculation (min) (ACUTE ONLY): 19 min   Charges:   PT Evaluation $PT Eval Low Complexity: 1 Low PT Treatments $Therapeutic Activity: 8-22 mins PT General Charges $$ ACUTE PT VISIT: 1 Visit         Janell Axe, SPT

## 2024-02-11 NOTE — [place] FL2 (Signed)
 [place]  MEDICAID FL2 LEVEL OF CARE FORM     IDENTIFICATION  Patient Name: Misty Page Birthdate: November 27, 1940 Sex: female Admission Date (Current Location): 02/09/2024  [place] and [place] Number:  Chiropodist and Address:  [facility], [address] [place], [name] [address]      Provider Number: [id number]  Attending Physician Name and Address:  Lenon Marien CROME, MD  Relative Name and Phone Number:  Carlyon [phone]    Current Level of Care: Hospital Recommended Level of Care: Skilled Nursing Facility Prior Approval Number:    Date Approved/Denied:   PASRR Number: [id number]  Discharge Plan: SNF    Current Diagnoses: Patient Active Problem List   Diagnosis Date Noted   Bilateral pleural effusion 02/10/2024   Acute pulmonary embolism without acute cor pulmonale (HCC) 02/10/2024   Acute pulmonary embolism with acute cor pulmonale (HCC) 02/09/2024   Acute respiratory failure with hypoxia (HCC) 02/09/2024   History of pelvic fracture 01/21/24 02/09/2024   Tremor 09/09/2023   Senile purpura (HCC) 03/04/2023   Phantom limb pain (HCC) 11/26/2022   Urolithiasis 09/19/2022   Right ureteral stone 09/19/2022   Iron  deficiency anemia 09/19/2022   CAD (coronary artery disease) 09/19/2022   Lung nodule 09/19/2022   Hydroureteronephrosis 09/19/2022   Atherosclerosis of native coronary artery of native heart with other form of angina pectoris (HCC) 07/23/2022   Atherosclerosis of native arteries of extremity with intermittent claudication (HCC) 07/15/2022   Lymphedema 01/02/2022   Intractable vomiting with nausea 04/13/2021   Vertigo 04/13/2021   Chronic anticoagulation 10/30/2020   Change in bowel habits 05/01/2020   Chronic mesenteric ischemia (HCC) 02/13/2020   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) 05/10/2019   Memory changes 05/10/2019   Infected orthopedic implant, initial encounter (HCC)  03/07/2019   Reactive depression 02/25/2019   Bilateral carotid artery disease (HCC) 06/13/2018   Sarcoma of left thigh (HCC) 06/13/2018   History of sarcoma s/p left AKA 05/08/2018   Innominate artery stenosis (HCC) 06/15/2017   History of TIA (transient ischemic attack) 04/18/2017   Bilateral carotid artery stenosis 02/07/2017   Allergic rhinitis, seasonal 10/15/2014   Cardiac murmur 10/15/2014   History of colon polyps 10/15/2014   Hyperthyroidism 10/15/2014   Cannot sleep 10/15/2014   Herpes zona 10/15/2014   Ulcerative colitis (HCC) 10/15/2014   Moderate aortic stenosis 12/03/2013   Arteriosclerosis of coronary artery 11/23/2013   History of MI (myocardial infarction) 11/23/2013   Peripheral vascular disease (HCC) 11/23/2013   Diverticulitis of colon 07/21/2009   Hemorrhoids without complication 01/27/2009   Atherosclerotic heart disease 10/29/2008   Essential (primary) hypertension 10/29/2008   Acid reflux 10/29/2008   Hypercholesteremia 10/29/2008   OP (osteoporosis) 10/29/2008    Orientation RESPIRATION BLADDER Height & Weight     Self, Time, Situation, Place  Normal   Weight: 128 lb 1.4 oz (58.1 kg) Height:  5' (152.4 cm)  BEHAVIORAL SYMPTOMS/MOOD NEUROLOGICAL BOWEL NUTRITION STATUS        Diet  AMBULATORY STATUS COMMUNICATION OF NEEDS Skin   Limited Assist Verbally Normal                       Personal Care Assistance Level of Assistance  Bathing, Feeding, Dressing Bathing Assistance: Limited assistance Feeding assistance: Limited assistance Dressing Assistance: Limited assistance     Functional Limitations Info  Sight, Hearing, Speech          SPECIAL CARE FACTORS FREQUENCY  PT (  By licensed PT), OT (By licensed OT)     PT Frequency: 2x OT Frequency: 2x            Contractures Contractures Info: Not present    Additional Factors Info  Code Status, Allergies Code Status Info: DNR-INTERVENE Allergies Info: Amoxicillin; Cephalosporins;  Naproxen; Pencillin; Shellfish; Vancomycin ; Codeine; Levofloxacin           Current Medications (02/11/2024):  This is the current hospital active medication list Current Facility-Administered Medications  Medication Dose Route Frequency Provider Last Rate Last Admin   acetaminophen  (TYLENOL ) tablet 650 mg  650 mg Oral Q6H PRN Duncan, Hazel V, MD       Or   acetaminophen  (TYLENOL ) suppository 650 mg  650 mg Rectal Q6H PRN Cleatus Delayne GAILS, MD       amLODipine  (NORVASC ) tablet 5 mg  5 mg Oral Daily Lenon Marien CROME, MD   5 mg at 02/11/24 0850   Chlorhexidine  Gluconate Cloth 2 % PADS 6 each  6 each Topical Daily Cleatus Delayne GAILS, MD   6 each at 02/11/24 0851   ezetimibe  (ZETIA ) tablet 10 mg  10 mg Oral Daily Duncan, Hazel V, MD   10 mg at 02/11/24 0849   famotidine  (PEPCID ) tablet 20 mg  20 mg Oral BID Duncan, Hazel V, MD   20 mg at 02/11/24 0850   furosemide  (LASIX ) injection 40 mg  40 mg Intravenous BID Lenon Marien CROME, MD   40 mg at 02/11/24 0850   heparin  ADULT infusion 100 units/mL (25000 units/250mL)  800 Units/hr Intravenous Continuous Cleatus Delayne GAILS, MD 8 mL/hr at 02/11/24 0852 800 Units/hr at 02/11/24 9147   hydrALAZINE  (APRESOLINE ) injection 2 mg  2 mg Intravenous Q6H Lenon Marien CROME, MD   2 mg at 02/11/24 1214   HYDROcodone -acetaminophen  (NORCO/VICODIN) 5-325 MG per tablet 1-2 tablet  1-2 tablet Oral Q6H PRN Cleatus Delayne GAILS, MD       HYDROmorphone  (DILAUDID ) injection 0.5-1 mg  0.5-1 mg Intravenous Q2H PRN Duncan, Hazel V, MD       losartan  (COZAAR ) tablet 100 mg  100 mg Oral Daily Duncan, Hazel V, MD   100 mg at 02/11/24 9150   Mesalamine  (ASACOL ) DR capsule 800 mg  800 mg Oral TID Lenon Marien CROME, MD   800 mg at 02/11/24 1216   [START ON 02/13/2024] methimazole  (TAPAZOLE ) tablet 5 mg  5 mg Oral Once per day on Monday Wednesday Friday Lenon Marien CROME, MD       And   methIMAzole  (TAPAZOLE ) tablet 2.5 mg  2.5 mg Oral Once per day on Sunday Tuesday Thursday Saturday  Lenon Marien CROME, MD   2.5 mg at 02/11/24 1215   ondansetron  (ZOFRAN ) tablet 4 mg  4 mg Oral Q6H PRN Duncan, Hazel V, MD       Or   ondansetron  (ZOFRAN ) injection 4 mg  4 mg Intravenous Q6H PRN Duncan, Hazel V, MD       Oral care mouth rinse  15 mL Mouth Rinse PRN Cleatus Delayne GAILS, MD       rosuvastatin  (CRESTOR ) tablet 10 mg  10 mg Oral Daily Duncan, Hazel V, MD   10 mg at 02/11/24 9149     Discharge Medications: Please see discharge summary for a list of discharge medications.  Relevant Imaging Results:  Relevant Lab Results:   Additional Information [id number]  [place] L Dawn Convery, LCSW

## 2024-02-11 NOTE — Progress Notes (Signed)
 PROGRESS NOTE Misty Page    DOB: 12-01-1940, 83 y.o.  FMW:[id number]    Code Status: Do not attempt resuscitation (DNR) PRE-ARREST INTERVENTIONS DESIRED   DOA: 02/09/2024   LOS: 2  Brief hospital course  Misty Page is a 83 y.o. female with a PMH significant for HTN, HLD, ulcerative colitis, CAD s/p PCI for prior MI, left AKA for sarcoma 2020, recently hospitalized at [facility] (8/23 to 01/30/2024) for multiple pelvic fractures related to ground-level fall, treated nonoperatively and discharged to rehab on DVT prophylaxis with ASA 81 mg twice daily, now being admitted with acute PE with hypoxic respiratory failure.  Outpatient CTA chest showed small PE within segmental branches of right middle lobe with evidence of right heart strain (RV LV ratio 1.5) consistent with at least submassive PE.  Also showed moderate-sized bilateral pleural effusions and patchy airspace disease/cardiomegaly/pulmonary artery hypertension. Vascular surgery evaluated and recommended treatment with anticoagulation and no thrombectomy required.  IR evaluated for possible thoracentesis but there is no safe window for approach so treating with diuretics at this time.  Patient's respiratory status improving. Has weaned from 4Lnc to 2Lnc.  Currently BP remains significant problem as it is severely elevated to 190s on home regimen. Adding hydralazine  to manage for now and will titrate as needed.   Assessment & Plan  Principal Problem:   Acute pulmonary embolism with acute cor pulmonale (HCC) Active Problems:   Acute respiratory failure with hypoxia (HCC)   History of pelvic fracture 01/21/24   CAD (coronary artery disease)   Essential (primary) hypertension   Hyperthyroidism   Ulcerative colitis (HCC)   History of TIA (transient ischemic attack)   Moderate aortic stenosis   History of sarcoma s/p left AKA   Bilateral pleural effusion   Acute pulmonary embolism without acute cor pulmonale (HCC)  Acute PE with acute cor  pulmonale (HCC)- per vascular surgery, not amenable to thrombectomy. Treat with anticoagulation. No evidence of DVT Acute respiratory failure with hypoxia- currently stable on 2L Bilateral pleural effusions- per IR, not amenable to thoracentesis. Treat with lasix  - wean O2 as tolerated.  - continue heparin  gtt> plan to change to eliquis  tomorrow if continues to improve.  - lasix  scheduled IV - f/u echo- unremarkable   History of pelvic fracture 01/21/24- provoked PE likely from fracture/decreased mobility  Currently at [facility] for rehab. Treating conservatively.  Resume PT today and advise on continued SNF vs home is appropriate.   CAD  Mildly elevated troponin- Troponin 45, likely related to demand ischemia in setting of severe pressures and clot burden. Denies chest pain On aspirin , Zetia , rosuvastatin  and losartan  Continue to monitor   Essential (primary) hypertension- poorly controlled Continue losartan  and home amlodipine  Starting hydralazine     Ulcerative colitis (HCC) No acute issues suspected. Resume home mesalamine    Hyperthyroidism Resume home methimazole    History of TIA (transient ischemic attack) No acute issues suspected   History of sarcoma s/p left AKA No acute issues suspected   Moderate aortic stenosis Moderate on echo 2021 Loud murmur on exam F/u echo  Body mass index is 25.02 kg/m.  VTE ppx: heparin  gtt  Diet:     Diet   Diet Heart Room service appropriate? Yes; Fluid consistency: Thin   Consultants: Vascular surgery IR  Subjective 02/11/24    Pt reports still having some SOB. No chest pain. Does not want to go back to rehab.    Objective  Blood pressure 133/85, pulse (!) 49, temperature 98.6 F (37 C),  temperature source Oral, resp. rate 20, height 5' (1.524 m), weight 60.3 kg, SpO2 93%.  Intake/Output Summary (Last 24 hours) at 02/11/2024 0716 Last data filed at 02/10/2024 2000 Gross per 24 hour  Intake 235.87 ml  Output 450 ml  Net  -214.13 ml   Filed Weights   02/09/24 1916 02/10/24 2225  Weight: 60.3 kg 58.1 kg    Physical Exam:  General: awake, alert, NAD HEENT: atraumatic, clear conjunctiva, anicteric sclera, MMM, hearing grossly normal Respiratory: normal respiratory effort. Cardiovascular: quick capillary refill, normal S1/S2, RRR, no JVD, murmurs Nervous: A&O x3. no gross focal neurologic deficits, normal speech Extremities: moves all equally, no edema, normal tone. L AKA Skin: dry, intact, normal temperature, normal color. No rashes, lesions or ulcers on exposed skin Psychiatry: normal mood, congruent affect  Labs   I have personally reviewed the following labs and imaging studies CBC    Component Value Date/Time   WBC 8.7 02/11/2024 0450   RBC 3.40 (L) 02/11/2024 0450   HGB 10.7 (L) 02/11/2024 0450   HGB 13.9 09/09/2023 1117   HCT 31.5 (L) 02/11/2024 0450   HCT 43.0 09/09/2023 1117   PLT 442 (H) 02/11/2024 0450   PLT 379 09/09/2023 1117   MCV 92.6 02/11/2024 0450   MCV 94 09/09/2023 1117   MCV 98 06/22/2014 0417   MCH 31.5 02/11/2024 0450   MCHC 34.0 02/11/2024 0450   RDW 14.6 02/11/2024 0450   RDW 13.4 09/09/2023 1117   RDW 14.5 06/22/2014 0417   LYMPHSABS 1.0 09/09/2023 1117   LYMPHSABS 1.4 06/22/2014 0417   MONOABS 0.5 03/23/2019 1514   MONOABS 0.7 06/22/2014 0417   EOSABS 0.2 09/09/2023 1117   EOSABS 0.0 06/22/2014 0417   BASOSABS 0.0 09/09/2023 1117   BASOSABS 0.0 06/22/2014 0417      Latest Ref Rng & Units 02/09/2024    7:14 PM 09/09/2023   11:17 AM 03/04/2023   11:36 AM  BMP  Glucose 70 - 99 mg/dL 877  883  887   BUN 8 - 23 mg/dL 23  15  15    Creatinine 0.44 - 1.00 mg/dL 9.28  9.42  9.52   BUN/Creat Ratio 12 - 28  26  32   Sodium 135 - 145 mmol/L 134  139  140   Potassium 3.5 - 5.1 mmol/L 3.3  4.2  3.9   Chloride 98 - 111 mmol/L 102  98  103   CO2 22 - 32 mmol/L 20  23  24    Calcium  8.9 - 10.3 mg/dL 8.3  9.8  9.3     US  CHEST (PLEURAL EFFUSION) Result Date:  02/10/2024 CLINICAL DATA:  83 year old female with small to moderate bilateral pleural effusions. EXAM: CHEST ULTRASOUND COMPARISON:  CT scan chest 02/09/2024 FINDINGS: Ultrasound evaluation of both the right and left chest demonstrates small to moderate pleural effusions. Unfortunately, lung flaps are present fluttering just below the pleural surface. A suitable window for access could not be identified. IMPRESSION: No suitable window for either right or left-sided thoracentesis. Thoracentesis was deferred. Electronically Signed   By: Wilkie Lent M.D.   On: 02/10/2024 13:36   ECHOCARDIOGRAM COMPLETE Result Date: 02/10/2024    ECHOCARDIOGRAM REPORT   Patient Name:   DEYJA SOCHACKI Date of Exam: 02/10/2024 Medical Rec #:  [id number]     Height:       60.0 in Accession #:    [id number]    Weight:       133.0 lb Date of Birth:  [birth year]     BSA:          1.569 m Patient Age:    83 years      BP:           104/70 mmHg Patient Gender: F             HR:           80 bpm. Exam Location:  [facility] Procedure: 2D Echo, Cardiac Doppler and Color Doppler (Both Spectral and Color            Flow Doppler were utilized during procedure). Indications:     Pulmonary embolus I26.09  History:         Patient has prior history of Echocardiogram examinations, most                  recent 09/21/2022. Previous Myocardial Infarction,                  Signs/Symptoms:Murmur; Risk Factors:Hypertension.  Sonographer:     Christopher Furnace Referring Phys:  [id number] DELAYNE LULLA SOLIAN Diagnosing Phys: Marsa Dooms MD  Sonographer Comments: Image acquisition challenging due to breast implants. IMPRESSIONS  1. Left ventricular ejection fraction, by estimation, is 55 to 60%. The left ventricle has normal function. The left ventricle has no regional wall motion abnormalities. Indeterminate diastolic filling due to E-A fusion.  2. Right ventricular systolic function is normal. The right ventricular size is normal.  3. The mitral valve is normal in  structure. Mild mitral valve regurgitation. No evidence of mitral stenosis.  4. The aortic valve is normal in structure. Aortic valve regurgitation is mild to moderate. Moderate aortic valve stenosis.  5. The inferior vena cava is normal in size with greater than 50% respiratory variability, suggesting right atrial pressure of 3 mmHg. FINDINGS  Left Ventricle: Left ventricular ejection fraction, by estimation, is 55 to 60%. The left ventricle has normal function. The left ventricle has no regional wall motion abnormalities. Strain was performed and the global longitudinal strain is indeterminate. The left ventricular internal cavity size was normal in size. There is no left ventricular hypertrophy. Indeterminate diastolic filling due to E-A fusion. Right Ventricle: The right ventricular size is normal. No increase in right ventricular wall thickness. Right ventricular systolic function is normal. Left Atrium: Left atrial size was normal in size. Right Atrium: Right atrial size was normal in size. Pericardium: There is no evidence of pericardial effusion. Mitral Valve: The mitral valve is normal in structure. Mild mitral valve regurgitation. No evidence of mitral valve stenosis. Tricuspid Valve: The tricuspid valve is normal in structure. Tricuspid valve regurgitation is mild . No evidence of tricuspid stenosis. Aortic Valve: The aortic valve is normal in structure. Aortic valve regurgitation is mild to moderate. Moderate aortic stenosis is present. Aortic valve mean gradient measures 28.0 mmHg. Aortic valve peak gradient measures 53.1 mmHg. Aortic valve area, by VTI measures 1.41 cm. Pulmonic Valve: The pulmonic valve was normal in structure. Pulmonic valve regurgitation is not visualized. No evidence of pulmonic stenosis. Aorta: The aortic root is normal in size and structure. Venous: The inferior vena cava is normal in size with greater than 50% respiratory variability, suggesting right atrial pressure of 3 mmHg.  IAS/Shunts: No atrial level shunt detected by color flow Doppler. Additional Comments: 3D was performed not requiring image post processing on an independent workstation and was indeterminate.  LEFT VENTRICLE PLAX 2D LVIDd:         3.60 cm  Diastology LVIDs:         2.60 cm   LV e' medial:    10.10 cm/s LV PW:         1.30 cm   LV E/e' medial:  13.8 LV IVS:        1.60 cm   LV e' lateral:   10.40 cm/s LVOT diam:     2.10 cm   LV E/e' lateral: 13.4 LV SV:         109 LV SV Index:   69 LVOT Area:     3.46 cm  RIGHT VENTRICLE RV Basal diam:  4.10 cm RV Mid diam:    3.10 cm LEFT ATRIUM              Index        RIGHT ATRIUM           Index LA diam:        4.60 cm  2.93 cm/m   RA Area:     15.80 cm LA Vol (A2C):   104.0 ml 66.26 ml/m  RA Volume:   37.20 ml  23.70 ml/m LA Vol (A4C):   111.0 ml 70.72 ml/m LA Biplane Vol: 110.0 ml 70.09 ml/m  AORTIC VALVE AV Area (Vmax):    1.23 cm AV Area (Vmean):   1.37 cm AV Area (VTI):     1.41 cm AV Vmax:           364.33 cm/s AV Vmean:          239.333 cm/s AV VTI:            0.772 m AV Peak Grad:      53.1 mmHg AV Mean Grad:      28.0 mmHg LVOT Vmax:         129.00 cm/s LVOT Vmean:        95.000 cm/s LVOT VTI:          0.314 m LVOT/AV VTI ratio: 0.41  AORTA Ao Root diam: 2.90 cm MITRAL VALVE                TRICUSPID VALVE MV Area (PHT): 5.50 cm     TR Peak grad:   63.7 mmHg MV Decel Time: 138 msec     TR Vmax:        399.00 cm/s MV E velocity: 139.00 cm/s                             SHUNTS                             Systemic VTI:  0.31 m                             Systemic Diam: 2.10 cm Marsa Dooms MD Electronically signed by Marsa Dooms MD Signature Date/Time: 02/10/2024/1:01:06 PM    Final    US  Venous Img Lower Unilateral Right (DVT) Result Date: 02/10/2024 EXAM: ULTRASOUND DUPLEX OF THE RIGHT LOWER EXTREMITY VEINS TECHNIQUE: Duplex ultrasound using B-mode/gray scaled imaging and Doppler spectral analysis and color flow was obtained of the deep  venous structures of the right lower extremity. COMPARISON: 11/16/2021 CLINICAL HISTORY: 141700 Pulmonary embolism (HCC) 141700. Pulmonary embolism (HCC) 141700. FINDINGS: The visualized veins of the lower extremity are patent and free of echogenic thrombus. The veins demonstrate good  compressibility with normal color flow study and spectral analysis. IMPRESSION: 1. No evidence of DVT. Electronically signed by: Waddell Calk MD 02/10/2024 08:35 AM EDT RP Workstation: [id number]   CT Angio Chest Pulmonary Embolism (PE) W or WO Contrast Addendum Date: 02/09/2024 ADDENDUM REPORT: 02/09/2024 17:59 ADDENDUM: These results were called by telephone at the time of interpretation on 02/09/2024 at 5:58 pm to provider Dr. Ammon, who verbally acknowledged these results. Electronically Signed   By: Greig Pique M.D.   On: 02/09/2024 17:59   Result Date: 02/09/2024 CLINICAL DATA:  SOB EXAM: CT ANGIOGRAPHY CHEST WITH CONTRAST TECHNIQUE: Multidetector CT imaging of the chest was performed using the standard protocol during bolus administration of intravenous contrast. Multiplanar CT image reconstructions and MIPs were obtained to evaluate the vascular anatomy. RADIATION DOSE REDUCTION: This exam was performed according to the departmental dose-optimization program which includes automated exposure control, adjustment of the mA and/or kV according to patient size and/or use of iterative reconstruction technique. CONTRAST:  75mL OMNIPAQUE  IOHEXOL  350 MG/ML SOLN COMPARISON:  CT PE protocol 02/14/2020. FINDINGS: Cardiovascular: There is adequate opacification of the pulmonary arteries to the segmental level. There small pulmonary emboli within segmental branches of the right middle lobe. No other pulmonary emboli are identified. Main pulmonary artery is enlarged compatible with pulmonary artery hypertension. The heart is enlarged. There is no pericardial effusion. There are atherosclerotic calcifications of the aorta and  coronary arteries. There is a vascular stent in the region of the right brachiocephalic artery. Mediastinum/Nodes: There are punctate calcifications in the thyroid  gland. There is a hypodense left thyroid  nodule measuring 6 mm. There are scattered nonenlarged mediastinal lymph nodes diffusely. Visualized esophagus within normal limits. Lungs/Pleura: There are moderate-sized bilateral pleural effusions. Mild emphysema present. There are are mild patchy multifocal airspace and ill-defined nodular densities throughout the bilateral upper lobes. There some atelectasis in the bilateral lower lobes and small amount of airspace disease in the right lower lobe. Upper Abdomen: No acute abnormality. Musculoskeletal: Bilateral breast implants are present. No acute osseous abnormality. Stable mild compression deformity of L2 Review of the MIP images confirms the above findings. IMPRESSION: 1. Small pulmonary emboli within segmental branches of the right middle lobe. Positive for acute PE with CTevidence of right heart strain (RV/LV Ratio = 1.5) consistent with at least submassive (intermediate risk) PE. The presence of right heart strain has been associated with an increased risk of morbidity and mortality. 2. Moderate-sized bilateral pleural effusions. 3. Mild patchy airspace and ill-defined nodular densities in the bilateral upper lobes and right lower lobe worrisome for multifocal pneumonia. 4. Cardiomegaly. 5. Findings compatible with pulmonary artery hypertension. Aortic Atherosclerosis (ICD10-I70.0) and Emphysema (ICD10-J43.9). Electronically Signed: By: Greig Pique M.D. On: 02/09/2024 17:47    Disposition Plan & Communication  Patient status: Inpatient  Admitted From: SNF Planned disposition location: Skilled nursing facility Anticipated discharge date: 9/14 pending clinical stability   Family Communication: daughter on phone     Author: Marien LITTIE Piety, DO [facility] 02/11/2024, 7:16 AM    Available by Epic secure chat 7AM-7PM. If 7PM-7AM, please contact night-coverage.  [facility] contact information found on [email or web address].

## 2024-02-11 NOTE — Consult Note (Signed)
 PHARMACY - ANTICOAGULATION CONSULT NOTE  Pharmacy Consult for Heparin  infusion Indication: pulmonary embolus  Patient Measurements: Height: 5' (152.4 cm) Weight: 58.1 kg (128 lb 1.4 oz) IBW/kg (Calculated) : 45.5 HEPARIN  DW (KG): 57.2  Labs: Recent Labs    02/09/24 1827 02/09/24 1914 02/09/24 2047 02/10/24 0351 02/10/24 1335 02/10/24 2127 02/11/24 0450  HGB  --  11.8*  --   --   --   --  10.7*  HCT  --  34.6*  --   --   --   --  31.5*  PLT  --  467*  --   --   --   --  442*  APTT  --  31  --   --   --   --   --   LABPROT  --  14.3  --   --   --   --   --   INR  --  1.0  --   --   --   --   --   HEPARINUNFRC  --   --   --    < > 0.66 0.50 0.41  CREATININE  --  0.71  --   --   --   --   --   TROPONINIHS 36*  --  45*  --   --   --   --    < > = values in this interval not displayed.    Estimated Creatinine Clearance: 42.5 mL/min (by C-G formula based on SCr of 0.71 mg/dL).   Medical History: Past Medical History:  Diagnosis Date   Allergy    Anemia    Arthritis    CA of skin 10/15/2014   Calculus of kidney 10/15/2014   Seen in ER 06/16/10.    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   Coronary artery disease    Diverticulitis    Environmental and seasonal allergies    Fibrocystic breast disease (FCBD)    GERD (gastroesophageal reflux disease)    Heart murmur    High cholesterol    History of heart artery stent    History of kidney stones    Hyperlipidemia    Hypertension    Hyperthyroidism    Myocardial infarction [facility])    Osteomyelitis of left femur (HCC) 11/30/2018   Osteoporosis    PONV (postoperative nausea and vomiting)    Reactive depression 02/25/2019   Thyroid  disease    TIA (transient ischemic attack)     Medications:  No AC prior to admission per med list and fill history  Assessment: Patient admitted with shortness of breath. PMH includes gastric reflux, hypertension,  hyperlipidemia, left lower extremity amputation at the hip, recent hospitalization for a fall/pelvis fracture.   Pharmacy consulted to start and manage heparin  for acute PE.  Goal of Therapy:  Heparin  level 0.3-0.7 units/ml Monitor platelets by anticoagulation protocol: Yes   Plan:  9/13:  HL @ 0450 = 0.41, therapeutic X 3 - will continue pt on current rate and recheck HL on 9/14 with AM labs  --Daily CBC per protocol while on IV heparin   Bertel Venard D 02/11/2024 5:59 AM

## 2024-02-11 NOTE — TOC Progression Note (Signed)
 Transition of Care [facility]) - Progression Note    Patient Details  Name: Misty Page MRN: [id number] Date of Birth: 08/26/1940  Transition of Care [facility]) CM/SW Contact  [name], [name] Phone Number: 02/11/2024, 4:44 PM  Clinical Narrative:     CSW spoke with patient regarding discharge planning. Patient advised that she was [facility]. She reported feeling safe and stated that the staff is really good to her. She stated that she felt that she could return home but she advised that it is not up to her.   CSW and patient discussed further PT needs. Patient agreed that a week is not long enough to assess her readiness to discharge home. She stated that she just wants to be in her home and in her bed. She stated that she will return to [facility].   CSW contacted Carlyon Seip, daughter, and discussed discharge planning. She advised that she was aware that her mother wanted to return home. She advised that is best that her mother return to [facility]. She stated that she wants her mother to continue to work on PT goals. She advised that it would also give her time to get the home ready and get some additional assistance. Patient currently has 3 days of PCS services but they come in for a few hours a day.   [name] advised that her mother should be discharged back to the facility. She also stated that there were concerns regarding her mothers blood pressure last night. [name] advised that her mother is not ready to come home. CSW advised that patient was very reasonable and she stated that although she wants to go home, she was willing to return to the facility.   FL2 will be completed.                     Expected Discharge Plan and Services                                               Social Drivers of Health (SDOH) Interventions SDOH Screenings   Food Insecurity: No Food Insecurity (02/09/2024)  Housing: Low Risk  (02/09/2024)  Transportation Needs: No  Transportation Needs (02/09/2024)  Utilities: Not At Risk (02/09/2024)  Alcohol Screen: Low Risk  (12/28/2023)  Depression (PHQ2-9): Low Risk  (12/28/2023)  Financial Resource Strain: Low Risk  (01/22/2024)   [facility]  Physical Activity: Inactive (12/28/2023)  Social Connections: Unknown (02/09/2024)  Recent Concern: Social Connections - Socially Isolated (12/28/2023)  Stress: No Stress Concern Present (12/28/2023)  Tobacco Use: Medium Risk (02/09/2024)  Health Literacy: Adequate Health Literacy (12/28/2023)    Readmission Risk Interventions     No data to display

## 2024-02-12 DIAGNOSIS — I2609 Other pulmonary embolism with acute cor pulmonale: Secondary | ICD-10-CM | POA: Diagnosis not present

## 2024-02-12 LAB — CBC
HCT: 29.2 % — ABNORMAL LOW (ref 36.0–46.0)
Hemoglobin: 9.8 g/dL — ABNORMAL LOW (ref 12.0–15.0)
MCH: 31.1 pg (ref 26.0–34.0)
MCHC: 33.6 g/dL (ref 30.0–36.0)
MCV: 92.7 fL (ref 80.0–100.0)
Platelets: 392 K/uL (ref 150–400)
RBC: 3.15 MIL/uL — ABNORMAL LOW (ref 3.87–5.11)
RDW: 14.6 % (ref 11.5–15.5)
WBC: 6.9 K/uL (ref 4.0–10.5)
nRBC: 0 % (ref 0.0–0.2)

## 2024-02-12 LAB — BASIC METABOLIC PANEL WITH GFR
Anion gap: 9 (ref 5–15)
BUN: 18 mg/dL (ref 8–23)
CO2: 26 mmol/L (ref 22–32)
Calcium: 7.9 mg/dL — ABNORMAL LOW (ref 8.9–10.3)
Chloride: 102 mmol/L (ref 98–111)
Creatinine, Ser: 0.38 mg/dL — ABNORMAL LOW (ref 0.44–1.00)
GFR, Estimated: >60 mL/min (ref >60)
Glucose, Bld: 102 mg/dL — ABNORMAL HIGH (ref 70–99)
Potassium: 2.9 mmol/L — ABNORMAL LOW (ref 3.5–5.1)
Sodium: 137 mmol/L (ref 135–145)

## 2024-02-12 LAB — HEPARIN LEVEL (UNFRACTIONATED): Heparin Unfractionated: 0.28 [IU]/mL — ABNORMAL LOW (ref 0.30–0.70)

## 2024-02-12 MED ORDER — APIXABAN 5 MG PO TABS
10.0000 mg | ORAL_TABLET | Freq: Two times a day (BID) | ORAL | Status: DC
Start: 2024-02-12 — End: 2024-02-14
  Administered 2024-02-12 – 2024-02-14 (×5): 10 mg via ORAL
  Filled 2024-02-12 (×5): qty 2

## 2024-02-12 MED ORDER — HYDRALAZINE HCL 50 MG PO TABS
50.0000 mg | ORAL_TABLET | Freq: Three times a day (TID) | ORAL | Status: DC
  Administered 2024-02-12 (×2): 50 mg via ORAL
  Filled 2024-02-12 (×2): qty 1

## 2024-02-12 MED ORDER — APIXABAN 5 MG PO TABS: 5.0000 mg | ORAL_TABLET | Freq: Two times a day (BID) | ORAL | Status: DC

## 2024-02-12 MED ORDER — HEPARIN BOLUS VIA INFUSION
900.0000 [IU] | Freq: Once | INTRAVENOUS | Status: AC
  Administered 2024-02-12: 900 [IU] via INTRAVENOUS
  Filled 2024-02-12: qty 900

## 2024-02-12 MED ORDER — HYDRALAZINE HCL 25 MG PO TABS
25.0000 mg | ORAL_TABLET | Freq: Three times a day (TID) | ORAL | Status: DC
  Administered 2024-02-12: 25 mg via ORAL
  Filled 2024-02-12: qty 1

## 2024-02-12 MED ORDER — POTASSIUM CHLORIDE CRYS ER 20 MEQ PO TBCR
40.0000 meq | EXTENDED_RELEASE_TABLET | Freq: Two times a day (BID) | ORAL | Status: AC
  Administered 2024-02-12 (×2): 40 meq via ORAL
  Filled 2024-02-12 (×2): qty 2

## 2024-02-12 NOTE — Consult Note (Signed)
 PHARMACY - ANTICOAGULATION CONSULT NOTE  Pharmacy Consult for Heparin  infusion Indication: pulmonary embolus  Patient Measurements: Height: 5' (152.4 cm) Weight: 58.1 kg (128 lb 1.4 oz) IBW/kg (Calculated) : 45.5 HEPARIN  DW (KG): 57.2  Labs: Recent Labs     0000 02/09/24 1827 02/09/24 1914 02/09/24 2047 02/10/24 0351 02/10/24 2127 02/11/24 0450 02/12/24 0442  HGB   < >  --  11.8*  --   --   --  10.7* 9.8*  HCT  --   --  34.6*  --   --   --  31.5* 29.2*  PLT  --   --  467*  --   --   --  442* 392  APTT  --   --  31  --   --   --   --   --   LABPROT  --   --  14.3  --   --   --   --   --   INR  --   --  1.0  --   --   --   --   --   HEPARINUNFRC  --   --   --   --    < > 0.50 0.41 0.28*  CREATININE  --   --  0.71  --   --   --   --  0.38*  TROPONINIHS  --  36*  --  45*  --   --   --   --    < > = values in this interval not displayed.    Estimated Creatinine Clearance: 42.5 mL/min (A) (by C-G formula based on SCr of 0.38 mg/dL (L)).   Medical History: Past Medical History:  Diagnosis Date   Allergy    Anemia    Arthritis    CA of skin 10/15/2014   Calculus of kidney 10/15/2014   Seen in ER 06/16/10.    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   Coronary artery disease    Diverticulitis    Environmental and seasonal allergies    Fibrocystic breast disease (FCBD)    GERD (gastroesophageal reflux disease)    Heart murmur    High cholesterol    History of heart artery stent    History of kidney stones    Hyperlipidemia    Hypertension    Hyperthyroidism    Myocardial infarction [facility])    Osteomyelitis of left femur (HCC) 11/30/2018   Osteoporosis    PONV (postoperative nausea and vomiting)    Reactive depression 02/25/2019   Thyroid  disease    TIA (transient ischemic attack)     Medications:  No AC prior to admission per med list and fill history  Assessment: Patient admitted with  shortness of breath. PMH includes gastric reflux, hypertension, hyperlipidemia, left lower extremity amputation at the hip, recent hospitalization for a fall/pelvis fracture.   Pharmacy consulted to start and manage heparin  for acute PE.  Goal of Therapy:  Heparin  level 0.3-0.7 units/ml Monitor platelets by anticoagulation protocol: Yes   Plan:  9/14:  HL @ 0442 = 0.28, SUBtherapeutic  - Will order heparin  900 units IV X 1 bolus and increase drip rate to 900 units/hr - Recheck HL 8 hrs after rate change  --Daily CBC per protocol while on IV heparin   Martavious Hartel D 02/12/2024 5:45 AM

## 2024-02-12 NOTE — Progress Notes (Signed)
 PROGRESS NOTE [name]    DOB: 16-Nov-1940, 83 y.o.  FMW:[id number]    Code Status: Do not attempt resuscitation (DNR) PRE-ARREST INTERVENTIONS DESIRED   DOA: 02/09/2024   LOS: 3  Brief hospital course  Misty Page is a 83 y.o. female with a PMH significant for HTN, HLD, ulcerative colitis, CAD s/p PCI for prior MI, left AKA for sarcoma 2020, recently hospitalized at [facility] (8/23 to 01/30/2024) for multiple pelvic fractures related to ground-level fall, treated nonoperatively and discharged to rehab on DVT prophylaxis with ASA 81 mg twice daily, now being admitted with acute PE with hypoxic respiratory failure.  Outpatient CTA chest showed small PE within segmental branches of right middle lobe with evidence of right heart strain (RV LV ratio 1.5) consistent with at least submassive PE.  Also showed moderate-sized bilateral pleural effusions and patchy airspace disease/cardiomegaly/pulmonary artery hypertension. Vascular surgery evaluated and recommended treatment with anticoagulation and no thrombectomy required.  IR evaluated for possible thoracentesis but there is no safe window for approach so treating with diuretics at this time.  Patient's respiratory status improving. Has weaned from 4Lnc to 2Lnc.  Currently BP remains significant problem as it is severely elevated to 190s on home regimen. Adding hydralazine  to manage for now and will titrate as needed.   Assessment & Plan  Principal Problem:   Acute pulmonary embolism with acute cor pulmonale (HCC) Active Problems:   Acute respiratory failure with hypoxia (HCC)   History of pelvic fracture 01/21/24   CAD (coronary artery disease)   Essential (primary) hypertension   Hyperthyroidism   Ulcerative colitis (HCC)   History of TIA (transient ischemic attack)   Moderate aortic stenosis   History of sarcoma s/p left AKA   Bilateral pleural effusion   Acute pulmonary embolism without acute cor pulmonale (HCC)  Acute PE with acute cor  pulmonale (HCC)- per vascular surgery, not amenable to thrombectomy. Treat with anticoagulation. No evidence of DVT Acute respiratory failure with hypoxia- currently stable on 2L Bilateral pleural effusions- per IR, not amenable to thoracentesis. Treat with lasix  - wean O2 as tolerated.  - heparin  gtt changed to eliquis  today. Will need at least 3 month course for provoked PE.   - continue lasix  scheduled IV. Good UOP. Renal function is tolerating well. Requiring potassium supplement - f/u echo- unremarkable  - BMP am   History of pelvic fracture 01/21/24- provoked PE likely from fracture/decreased mobility. Currently at [facility] for rehab. Treating conservatively.  Resumed PT and recommending return to SNF. Patient and daughter agreeable to this plan.  - hgb gradually declining but still well above transfusion threshold. Will continue to monitor while inpatient and on blood thinners. No signs of obvious bleeding   CAD  Mildly elevated troponin- Troponin 45, likely related to demand ischemia in setting of severe pressures and clot burden. Denies chest pain On aspirin , Zetia , rosuvastatin  and losartan  Continue to monitor   Essential (primary) hypertension- improved Continue losartan  and home amlodipine  Continue hydralazine     Ulcerative colitis (HCC) No acute issues suspected. Resume home mesalamine    Hyperthyroidism Resume home methimazole    History of TIA (transient ischemic attack) No acute issues suspected   History of sarcoma s/p left AKA No acute issues suspected   Moderate aortic stenosis Moderate on echo 2021 Loud murmur on exam F/u echo  Body mass index is 25.02 kg/m.  VTE ppx: heparin  gttapixaban (ELIQUIS ) tablet 10 mg  apixaban  (ELIQUIS ) tablet 5 mg   Diet:     Diet  Diet Heart Room service appropriate? Yes; Fluid consistency: Thin   Consultants: Vascular surgery IR  Subjective 02/12/24    Pt reports feeling well. SOB is resolving. No chest pain. Agreeable  to going to SNF. Denies bleeding. We discussed risks/indication of DOAC.    Objective  Blood pressure 133/85, pulse (!) 49, temperature 98.6 F (37 C), temperature source Oral, resp. rate 20, height 5' (1.524 m), weight 60.3 kg, SpO2 93%.  Intake/Output Summary (Last 24 hours) at 02/12/2024 1148 Last data filed at 02/11/2024 2028 Gross per 24 hour  Intake 68.13 ml  Output 850 ml  Net -781.87 ml   Filed Weights   02/09/24 1916 02/10/24 2225  Weight: 60.3 kg 58.1 kg    Physical Exam:  General: awake, alert, NAD HEENT: atraumatic, clear conjunctiva, anicteric sclera, MMM, hearing grossly normal Respiratory: normal respiratory effort. Cardiovascular: quick capillary refill, normal S1/S2, RRR, no JVD, murmurs Nervous: A&O x3. no gross focal neurologic deficits, normal speech Extremities: moves all equally, no edema, normal tone. L AKA Skin: dry, intact, normal temperature, normal color. No rashes, lesions or ulcers on exposed skin Psychiatry: normal mood, congruent affect  Labs   I have personally reviewed the following labs and imaging studies CBC    Component Value Date/Time   WBC 6.9 02/12/2024 0442   RBC 3.15 (L) 02/12/2024 0442   HGB 9.8 (L) 02/12/2024 0442   HGB 13.9 09/09/2023 1117   HCT 29.2 (L) 02/12/2024 0442   HCT 43.0 09/09/2023 1117   PLT 392 02/12/2024 0442   PLT 379 09/09/2023 1117   MCV 92.7 02/12/2024 0442   MCV 94 09/09/2023 1117   MCV 98 06/22/2014 0417   MCH 31.1 02/12/2024 0442   MCHC 33.6 02/12/2024 0442   RDW 14.6 02/12/2024 0442   RDW 13.4 09/09/2023 1117   RDW 14.5 06/22/2014 0417   LYMPHSABS 1.0 09/09/2023 1117   LYMPHSABS 1.4 06/22/2014 0417   MONOABS 0.5 03/23/2019 1514   MONOABS 0.7 06/22/2014 0417   EOSABS 0.2 09/09/2023 1117   EOSABS 0.0 06/22/2014 0417   BASOSABS 0.0 09/09/2023 1117   BASOSABS 0.0 06/22/2014 0417      Latest Ref Rng & Units 02/12/2024    4:42 AM 02/09/2024    7:14 PM 09/09/2023   11:17 AM  BMP  Glucose 70 - 99 mg/dL  897  877  883   BUN 8 - 23 mg/dL 18  23  15    Creatinine 0.44 - 1.00 mg/dL 9.61  9.28  9.42   BUN/Creat Ratio 12 - 28   26   Sodium 135 - 145 mmol/L 137  134  139   Potassium 3.5 - 5.1 mmol/L 2.9  3.3  4.2   Chloride 98 - 111 mmol/L 102  102  98   CO2 22 - 32 mmol/L 26  20  23    Calcium  8.9 - 10.3 mg/dL 7.9  8.3  9.8     US  CHEST (PLEURAL EFFUSION) Result Date: 02/10/2024 CLINICAL DATA:  83 year old female with small to moderate bilateral pleural effusions. EXAM: CHEST ULTRASOUND COMPARISON:  CT scan chest 02/09/2024 FINDINGS: Ultrasound evaluation of both the right and left chest demonstrates small to moderate pleural effusions. Unfortunately, lung flaps are present fluttering just below the pleural surface. A suitable window for access could not be identified. IMPRESSION: No suitable window for either right or left-sided thoracentesis. Thoracentesis was deferred. Electronically Signed   By: Wilkie Lent M.D.   On: 02/10/2024 13:36    Disposition Plan &  Communication  Patient status: Inpatient  Admitted From: SNF Planned disposition location: Skilled nursing facility Anticipated discharge date: 9/15 pending clinical stability   Family Communication: daughter on phone     Author: Marien LITTIE Piety, DO [facility] 02/12/2024, 11:48 AM   Available by Epic secure chat 7AM-7PM. If 7PM-7AM, please contact night-coverage.  [facility] contact information found on [email or web address].

## 2024-02-12 NOTE — Plan of Care (Signed)
  Problem: Clinical Measurements: Goal: Diagnostic test results will improve Outcome: Progressing Goal: Respiratory complications will improve Outcome: Progressing   Problem: Coping: Goal: Level of anxiety will decrease Outcome: Progressing

## 2024-02-13 ENCOUNTER — Other Ambulatory Visit: Payer: Self-pay

## 2024-02-13 DIAGNOSIS — I2609 Other pulmonary embolism with acute cor pulmonale: Secondary | ICD-10-CM | POA: Diagnosis not present

## 2024-02-13 LAB — CBC
HCT: 29.9 % — ABNORMAL LOW (ref 36.0–46.0)
Hemoglobin: 9.8 g/dL — ABNORMAL LOW (ref 12.0–15.0)
MCH: 31.3 pg (ref 26.0–34.0)
MCHC: 32.8 g/dL (ref 30.0–36.0)
MCV: 95.5 fL (ref 80.0–100.0)
Platelets: 380 K/uL (ref 150–400)
RBC: 3.13 MIL/uL — ABNORMAL LOW (ref 3.87–5.11)
RDW: 14.8 % (ref 11.5–15.5)
WBC: 6.7 K/uL (ref 4.0–10.5)
nRBC: 0 % (ref 0.0–0.2)

## 2024-02-13 LAB — BASIC METABOLIC PANEL WITH GFR
Anion gap: 7 (ref 5–15)
BUN: 15 mg/dL (ref 8–23)
CO2: 27 mmol/L (ref 22–32)
Calcium: 7.8 mg/dL — ABNORMAL LOW (ref 8.9–10.3)
Chloride: 104 mmol/L (ref 98–111)
Creatinine, Ser: 0.46 mg/dL (ref 0.44–1.00)
GFR, Estimated: >60 mL/min (ref >60)
Glucose, Bld: 91 mg/dL (ref 70–99)
Potassium: 4.3 mmol/L (ref 3.5–5.1)
Sodium: 138 mmol/L (ref 135–145)

## 2024-02-13 MED ORDER — HYDRALAZINE HCL 50 MG PO TABS
50.0000 mg | ORAL_TABLET | Freq: Three times a day (TID) | ORAL | Status: DC
  Administered 2024-02-13 – 2024-02-14 (×4): 50 mg via ORAL
  Filled 2024-02-13 (×4): qty 1

## 2024-02-13 MED ORDER — AMLODIPINE BESYLATE 10 MG PO TABS
10.0000 mg | ORAL_TABLET | Freq: Every day | ORAL | Status: DC
  Administered 2024-02-13 – 2024-02-14 (×2): 10 mg via ORAL
  Filled 2024-02-13 (×2): qty 1

## 2024-02-13 MED ORDER — HYDRALAZINE HCL 50 MG PO TABS
100.0000 mg | ORAL_TABLET | Freq: Three times a day (TID) | ORAL | Status: DC
  Administered 2024-02-13: 100 mg via ORAL
  Filled 2024-02-13: qty 2

## 2024-02-13 NOTE — Progress Notes (Signed)
 PROGRESS NOTE Misty Page    DOB: Apr 25, 1941, 83 y.o.  FMW:[id number]    Code Status: Do not attempt resuscitation (DNR) PRE-ARREST INTERVENTIONS DESIRED   DOA: 02/09/2024   LOS: 4  Brief hospital course  Misty Page is a 83 y.o. female with a PMH significant for HTN, HLD, ulcerative colitis, CAD s/p PCI for prior MI, left AKA for sarcoma 2020, recently hospitalized at [facility] (8/23 to 01/30/2024) for multiple pelvic fractures related to ground-level fall, treated nonoperatively and discharged to rehab on DVT prophylaxis with ASA 81 mg twice daily, now being admitted with acute PE with hypoxic respiratory failure.  Outpatient CTA chest showed small PE within segmental branches of right middle lobe with evidence of right heart strain (RV LV ratio 1.5) consistent with at least submassive PE.  Also showed moderate-sized bilateral pleural effusions and patchy airspace disease/cardiomegaly/pulmonary artery hypertension. Vascular surgery evaluated and recommended treatment with anticoagulation and no thrombectomy required.  IR evaluated for possible thoracentesis but there is no safe window for approach so treating with diuretics at this time.  Patient's respiratory status improving. Has weaned from 4Lnc to room air now. BP remains severely elevated to 190s on home regimen so added hydralazine  to manage for now and will titrate as needed.   Assessment & Plan  Principal Problem:   Acute pulmonary embolism with acute cor pulmonale (HCC) Active Problems:   Acute respiratory failure with hypoxia (HCC)   History of pelvic fracture 01/21/24   CAD (coronary artery disease)   Essential (primary) hypertension   Hyperthyroidism   Ulcerative colitis (HCC)   History of TIA (transient ischemic attack)   Moderate aortic stenosis   History of sarcoma s/p left AKA   Bilateral pleural effusion   Acute pulmonary embolism without acute cor pulmonale (HCC)  Acute PE with acute cor pulmonale (HCC)- per vascular  surgery, not amenable to thrombectomy. Treat with anticoagulation. No evidence of DVT Acute respiratory failure with hypoxia- currently stable on room air Bilateral pleural effusions- per IR, not amenable to thoracentesis. Treat with lasix  - weaned to room air - heparin  gtt changed to eliquis . Will need at least 3 month course for provoked PE.   - continue lasix  scheduled IV. Good UOP. Renal function is tolerating well. Requiring potassium supplement - f/u echo- unremarkable   History of pelvic fracture 01/21/24- provoked PE likely from fracture/decreased mobility. Currently at [facility] for rehab. Treating conservatively.  Resumed PT and recommending return to SNF. Patient and daughter agreeable to this plan.  - hgb gradually declining but still well above transfusion threshold and stable today at 9.8. Will continue to monitor while inpatient and on blood thinners. No signs of obvious bleeding   CAD  Mildly elevated troponin- Troponin 45, likely related to demand ischemia in setting of severe pressures and clot burden. Denies chest pain On aspirin , Zetia , rosuvastatin  and losartan  Continue to monitor   Essential (primary) hypertension- improved Continue losartan  and home amlodipine  at increased dose Continue hydralazine     Ulcerative colitis (HCC) No acute issues suspected. Resume home mesalamine    Hyperthyroidism Continue home methimazole    History of TIA (transient ischemic attack) No acute issues suspected   History of sarcoma s/p left AKA No acute issues suspected   Moderate aortic stenosis Moderate on echo 2021 Loud murmur on exam F/u echo  Body mass index is 25.02 kg/m.  VTE ppx: heparin  gttapixaban (ELIQUIS ) tablet 10 mg  apixaban  (ELIQUIS ) tablet 5 mg   Diet:     Diet  Diet Heart Room service appropriate? Yes; Fluid consistency: Thin   Consultants: Vascular surgery IR  Subjective 02/13/24    Pt reports feeling generalized tired today. Denies CP, SOB.     Objective  Blood pressure 133/85, pulse (!) 49, temperature 98.6 F (37 C), temperature source Oral, resp. rate 20, height 5' (1.524 m), weight 60.3 kg, SpO2 93%.  Intake/Output Summary (Last 24 hours) at 02/13/2024 0733 Last data filed at 02/13/2024 0700 Gross per 24 hour  Intake --  Output 1000 ml  Net -1000 ml   Filed Weights   02/09/24 1916 02/10/24 2225  Weight: 60.3 kg 58.1 kg    Physical Exam:  General: awake, alert, NAD HEENT: atraumatic, clear conjunctiva, anicteric sclera, MMM, hearing grossly normal Respiratory: normal respiratory effort. Nervous: A&O x3. no gross focal neurologic deficits, normal speech Extremities: moves all equally, no edema, normal tone. L AKA Skin: dry, intact, normal temperature, normal color. No rashes, lesions or ulcers on exposed skin Psychiatry: normal mood, congruent affect  Labs   I have personally reviewed the following labs and imaging studies CBC    Component Value Date/Time   WBC 6.7 02/13/2024 0346   RBC 3.13 (L) 02/13/2024 0346   HGB 9.8 (L) 02/13/2024 0346   HGB 13.9 09/09/2023 1117   HCT 29.9 (L) 02/13/2024 0346   HCT 43.0 09/09/2023 1117   PLT 380 02/13/2024 0346   PLT 379 09/09/2023 1117   MCV 95.5 02/13/2024 0346   MCV 94 09/09/2023 1117   MCV 98 06/22/2014 0417   MCH 31.3 02/13/2024 0346   MCHC 32.8 02/13/2024 0346   RDW 14.8 02/13/2024 0346   RDW 13.4 09/09/2023 1117   RDW 14.5 06/22/2014 0417   LYMPHSABS 1.0 09/09/2023 1117   LYMPHSABS 1.4 06/22/2014 0417   MONOABS 0.5 03/23/2019 1514   MONOABS 0.7 06/22/2014 0417   EOSABS 0.2 09/09/2023 1117   EOSABS 0.0 06/22/2014 0417   BASOSABS 0.0 09/09/2023 1117   BASOSABS 0.0 06/22/2014 0417      Latest Ref Rng & Units 02/13/2024    3:46 AM 02/12/2024    4:42 AM 02/09/2024    7:14 PM  BMP  Glucose 70 - 99 mg/dL 91  897  877   BUN 8 - 23 mg/dL 15  18  23    Creatinine 0.44 - 1.00 mg/dL 9.53  9.61  9.28   Sodium 135 - 145 mmol/L 138  137  134   Potassium 3.5 - 5.1  mmol/L 4.3  2.9  3.3   Chloride 98 - 111 mmol/L 104  102  102   CO2 22 - 32 mmol/L 27  26  20    Calcium  8.9 - 10.3 mg/dL 7.8  7.9  8.3    No results found.  Disposition Plan & Communication  Patient status: Inpatient  Admitted From: SNF Planned disposition location: Skilled nursing facility Anticipated discharge date: 9/16 insurance authorization   Family Communication: daughter at bedside    Author: Marien LITTIE Piety, DO [facility] 02/13/2024, 7:33 AM   Available by Epic secure chat 7AM-7PM. If 7PM-7AM, please contact night-coverage.  TR[facility] contact information found on [email or web address].

## 2024-02-13 NOTE — Plan of Care (Signed)

## 2024-02-13 NOTE — TOC Progression Note (Signed)
 Transition of Care [facility]) - Progression Note    Patient Details  Name: CHIANTI GOH MRN: [id number] Date of Birth: 18-Mar-1941  [facility]) CM/SW Contact  Lauraine JAYSON Carpen, LCSW Phone Number: 02/13/2024, 10:48 AM  Clinical Narrative:  [facility] admissions coordinator confirmed patient can return when auth approved. CSW started serbia.   Expected Discharge Plan and Services                                               Social Drivers of Health (SDOH) Interventions SDOH Screenings   Food Insecurity: No Food Insecurity (02/09/2024)  Housing: Low Risk  (02/09/2024)  Transportation Needs: No Transportation Needs (02/09/2024)  Utilities: Not At Risk (02/09/2024)  Alcohol Screen: Low Risk  (12/28/2023)  Depression (PHQ2-9): Low Risk  (12/28/2023)  Financial Resource Strain: Low Risk  (01/22/2024)   Received from [facility]  Physical Activity: Inactive (12/28/2023)  Social Connections: Socially Isolated (02/13/2024)  Stress: No Stress Concern Present (12/28/2023)  Tobacco Use: Medium Risk (02/09/2024)  Health Literacy: Adequate Health Literacy (12/28/2023)    Readmission Risk Interventions     No data to display

## 2024-02-14 DIAGNOSIS — Z89612 Acquired absence of left leg above knee: Secondary | ICD-10-CM | POA: Diagnosis not present

## 2024-02-14 DIAGNOSIS — Z8781 Personal history of (healed) traumatic fracture: Secondary | ICD-10-CM | POA: Diagnosis not present

## 2024-02-14 DIAGNOSIS — I2609 Other pulmonary embolism with acute cor pulmonale: Secondary | ICD-10-CM | POA: Diagnosis not present

## 2024-02-14 DIAGNOSIS — J069 Acute upper respiratory infection, unspecified: Secondary | ICD-10-CM | POA: Diagnosis not present

## 2024-02-14 DIAGNOSIS — S78112A Complete traumatic amputation at level between left hip and knee, initial encounter: Secondary | ICD-10-CM | POA: Diagnosis not present

## 2024-02-14 DIAGNOSIS — J811 Chronic pulmonary edema: Secondary | ICD-10-CM | POA: Diagnosis not present

## 2024-02-14 DIAGNOSIS — R0602 Shortness of breath: Secondary | ICD-10-CM | POA: Diagnosis not present

## 2024-02-14 DIAGNOSIS — Z7401 Bed confinement status: Secondary | ICD-10-CM | POA: Diagnosis not present

## 2024-02-14 DIAGNOSIS — R011 Cardiac murmur, unspecified: Secondary | ICD-10-CM | POA: Diagnosis not present

## 2024-02-14 DIAGNOSIS — Z85831 Personal history of malignant neoplasm of soft tissue: Secondary | ICD-10-CM | POA: Diagnosis not present

## 2024-02-14 DIAGNOSIS — I10 Essential (primary) hypertension: Secondary | ICD-10-CM | POA: Diagnosis not present

## 2024-02-14 DIAGNOSIS — I2510 Atherosclerotic heart disease of native coronary artery without angina pectoris: Secondary | ICD-10-CM | POA: Diagnosis not present

## 2024-02-14 DIAGNOSIS — K5190 Ulcerative colitis, unspecified, without complications: Secondary | ICD-10-CM | POA: Diagnosis not present

## 2024-02-14 DIAGNOSIS — E0590 Thyrotoxicosis, unspecified without thyrotoxic crisis or storm: Secondary | ICD-10-CM | POA: Diagnosis not present

## 2024-02-14 DIAGNOSIS — I350 Nonrheumatic aortic (valve) stenosis: Secondary | ICD-10-CM | POA: Diagnosis not present

## 2024-02-14 DIAGNOSIS — J90 Pleural effusion, not elsewhere classified: Secondary | ICD-10-CM | POA: Diagnosis not present

## 2024-02-14 DIAGNOSIS — Z8673 Personal history of transient ischemic attack (TIA), and cerebral infarction without residual deficits: Secondary | ICD-10-CM | POA: Diagnosis not present

## 2024-02-14 DIAGNOSIS — J9601 Acute respiratory failure with hypoxia: Secondary | ICD-10-CM | POA: Diagnosis not present

## 2024-02-14 LAB — CULTURE, BLOOD (ROUTINE X 2)
Culture: NO GROWTH
Culture: NO GROWTH
Special Requests: ADEQUATE

## 2024-02-14 MED ORDER — HYDRALAZINE HCL 50 MG PO TABS: 50.0000 mg | ORAL_TABLET | Freq: Three times a day (TID) | ORAL | Status: DC

## 2024-02-14 MED ORDER — APIXABAN 5 MG PO TABS: ORAL_TABLET | ORAL | Status: DC

## 2024-02-14 NOTE — Progress Notes (Signed)
 Report given at bedside to [facility], RN. All questions/concerns addressed.

## 2024-02-14 NOTE — TOC Transition Note (Signed)
 Transition of Care [facility]) - Discharge Note   Patient Details  Name: Misty Page MRN: [id number] Date of Birth: 07/20/1940  Transition of Care [facility]) CM/SW Contact:  Lauraine JAYSON Carpen, LCSW Phone Number: 02/14/2024, 1:48 PM   Clinical Narrative:   Patient has orders to discharge to [facility] today. RN will call report to [phone] (Room 205). [facility] has been arranged. There are several ahead of her on the list. No further concerns. CSW signing off.  Final next level of care: Skilled Nursing Facility Barriers to Discharge: Barriers Resolved   Patient Goals and CMS Choice     Choice offered to / list presented to : Patient, Adult Children      Discharge Placement   Existing PASRR number confirmed : 02/11/24          Patient chooses bed at: [facility] Patient to be transferred to facility by: [facility] Name of family member notified: Patient has already notified her daughter Patient and family notified of of transfer: 02/14/24  Discharge Plan and Services Additional resources added to the After Visit Summary for                                       Social Drivers of Health (SDOH) Interventions SDOH Screenings   Food Insecurity: No Food Insecurity (02/09/2024)  Housing: Low Risk  (02/09/2024)  Transportation Needs: No Transportation Needs (02/09/2024)  Utilities: Not At Risk (02/09/2024)  Alcohol Screen: Low Risk  (12/28/2023)  Depression (PHQ2-9): Low Risk  (12/28/2023)  Financial Resource Strain: Low Risk  (01/22/2024)   Received from [facility]  Physical Activity: Inactive (12/28/2023)  Social Connections: Socially Isolated (02/13/2024)  Stress: No Stress Concern Present (12/28/2023)  Tobacco Use: Medium Risk (02/09/2024)  Health Literacy: Adequate Health Literacy (12/28/2023)     Readmission Risk Interventions     No data to display

## 2024-02-14 NOTE — Discharge Instructions (Signed)

## 2024-02-14 NOTE — Plan of Care (Signed)

## 2024-02-14 NOTE — Progress Notes (Signed)
 Called report to [facility] to RN Rosina, nurse for room 205

## 2024-02-14 NOTE — TOC Progression Note (Addendum)
 Transition of Care [facility]) - Progression Note    Patient Details  Name: Misty Page MRN: [id number] Date of Birth: [birth year]  Transition of Care [facility]) CM/SW Contact  Lauraine JAYSON Carpen, LCSW Phone Number: 02/14/2024, 9:29 AM  Clinical Narrative:   SNF insurance authorization is still pending.  12:53 pm: Auth approved: [id number]. Valid 9/16-9/23. Left message for [facility] admissions coordinator to notify and see if they have a bed today.  Expected Discharge Plan and Services                                               Social Drivers of Health (SDOH) Interventions SDOH Screenings   Food Insecurity: No Food Insecurity (02/09/2024)  Housing: Low Risk  (02/09/2024)  Transportation Needs: No Transportation Needs (02/09/2024)  Utilities: Not At Risk (02/09/2024)  Alcohol Screen: Low Risk  (12/28/2023)  Depression (PHQ2-9): Low Risk  (12/28/2023)  Financial Resource Strain: Low Risk  (01/22/2024)   Received from [facility]  Physical Activity: Inactive (12/28/2023)  Social Connections: Socially Isolated (02/13/2024)  Stress: No Stress Concern Present (12/28/2023)  Tobacco Use: Medium Risk (02/09/2024)  Health Literacy: Adequate Health Literacy (12/28/2023)    Readmission Risk Interventions     No data to display

## 2024-02-14 NOTE — Plan of Care (Signed)
 Problem: Education: Goal: Knowledge of General Education information will improve Description: Including pain rating scale, medication(s)/side effects and non-pharmacologic comfort measures 02/14/2024 1723 by Arloa Dene KIDD, RN Outcome: Adequate for Discharge 02/14/2024 1723 by Arloa Dene KIDD, RN Outcome: Adequate for Discharge 02/14/2024 1221 by Arloa Dene KIDD, RN Outcome: Progressing   Problem: Health Behavior/Discharge Planning: Goal: Ability to manage health-related needs will improve 02/14/2024 1723 by Arloa Dene KIDD, RN Outcome: Adequate for Discharge 02/14/2024 1723 by Arloa Dene KIDD, RN Outcome: Adequate for Discharge 02/14/2024 1221 by Arloa Dene KIDD, RN Outcome: Progressing   Problem: Clinical Measurements: Goal: Ability to maintain clinical measurements within normal limits will improve 02/14/2024 1723 by Arloa Dene KIDD, RN Outcome: Adequate for Discharge 02/14/2024 1723 by Arloa Dene KIDD, RN Outcome: Adequate for Discharge 02/14/2024 1221 by Arloa Dene KIDD, RN Outcome: Progressing Goal: Will remain free from infection 02/14/2024 1723 by Arloa Dene KIDD, RN Outcome: Adequate for Discharge 02/14/2024 1723 by Arloa Dene KIDD, RN Outcome: Adequate for Discharge 02/14/2024 1221 by Arloa Dene KIDD, RN Outcome: Progressing Goal: Diagnostic test results will improve 02/14/2024 1723 by Arloa Dene KIDD, RN Outcome: Adequate for Discharge 02/14/2024 1723 by Arloa Dene KIDD, RN Outcome: Adequate for Discharge 02/14/2024 1221 by Arloa Dene KIDD, RN Outcome: Progressing Goal: Respiratory complications will improve 02/14/2024 1723 by Arloa Dene KIDD, RN Outcome: Adequate for Discharge 02/14/2024 1723 by Arloa Dene KIDD, RN Outcome: Adequate for Discharge 02/14/2024 1221 by Arloa Dene KIDD, RN Outcome: Progressing Goal: Cardiovascular complication will be avoided 02/14/2024 1723 by Arloa Dene KIDD, RN Outcome: Adequate for  Discharge 02/14/2024 1723 by Arloa Dene KIDD, RN Outcome: Adequate for Discharge 02/14/2024 1221 by Arloa Dene KIDD, RN Outcome: Progressing   Problem: Activity: Goal: Risk for activity intolerance will decrease 02/14/2024 1723 by Arloa Dene KIDD, RN Outcome: Adequate for Discharge 02/14/2024 1723 by Arloa Dene KIDD, RN Outcome: Adequate for Discharge 02/14/2024 1221 by Arloa Dene KIDD, RN Outcome: Progressing   Problem: Nutrition: Goal: Adequate nutrition will be maintained 02/14/2024 1723 by Arloa Dene KIDD, RN Outcome: Adequate for Discharge 02/14/2024 1723 by Arloa Dene KIDD, RN Outcome: Adequate for Discharge 02/14/2024 1221 by Arloa Dene KIDD, RN Outcome: Progressing   Problem: Coping: Goal: Level of anxiety will decrease 02/14/2024 1723 by Arloa Dene KIDD, RN Outcome: Adequate for Discharge 02/14/2024 1723 by Arloa Dene KIDD, RN Outcome: Adequate for Discharge 02/14/2024 1221 by Arloa Dene KIDD, RN Outcome: Progressing   Problem: Elimination: Goal: Will not experience complications related to bowel motility 02/14/2024 1723 by Arloa Dene KIDD, RN Outcome: Adequate for Discharge 02/14/2024 1723 by Arloa Dene KIDD, RN Outcome: Adequate for Discharge 02/14/2024 1221 by Arloa Dene KIDD, RN Outcome: Progressing Goal: Will not experience complications related to urinary retention 02/14/2024 1723 by Arloa Dene KIDD, RN Outcome: Adequate for Discharge 02/14/2024 1723 by Arloa Dene KIDD, RN Outcome: Adequate for Discharge 02/14/2024 1221 by Arloa Dene KIDD, RN Outcome: Progressing   Problem: Pain Managment: Goal: General experience of comfort will improve and/or be controlled 02/14/2024 1723 by Arloa Dene KIDD, RN Outcome: Adequate for Discharge 02/14/2024 1723 by Arloa Dene KIDD, RN Outcome: Adequate for Discharge 02/14/2024 1221 by Arloa Dene KIDD, RN Outcome: Progressing   Problem: Safety: Goal: Ability to remain free  from injury will improve 02/14/2024 1723 by Arloa Dene KIDD, RN Outcome: Adequate for Discharge 02/14/2024 1723 by Arloa Dene KIDD, RN Outcome: Adequate for Discharge 02/14/2024 1221 by Arloa Dene KIDD, RN Outcome: Progressing   Problem: Skin Integrity: Goal: Risk for impaired  skin integrity will decrease 02/14/2024 1723 by Arloa Dene KIDD, RN Outcome: Adequate for Discharge 02/14/2024 1723 by Arloa Dene KIDD, RN Outcome: Adequate for Discharge 02/14/2024 1221 by Arloa Dene KIDD, RN Outcome: Progressing

## 2024-02-14 NOTE — Discharge Summary (Signed)
 Physician Discharge Summary  Patient: Misty Page FMW:[id number] DOB: [birth year]   Code Status: Do not attempt resuscitation (DNR) PRE-ARREST INTERVENTIONS DESIRED Admit date: 02/09/2024 Discharge date: 02/14/2024 Disposition: Skilled nursing facility, PT, OT, nurse aid, RN, and wound care PCP: [name], MD  Recommendations for Outpatient Follow-up:  Follow up with PCP within 1-2 weeks Regarding general hospital follow up and preventative care Recommend discussing length of DOAC needed  Discharge Diagnoses:  Principal Problem:   Acute pulmonary embolism with acute cor pulmonale (HCC) Active Problems:   Acute respiratory failure with hypoxia (HCC)   History of pelvic fracture 01/21/24   CAD (coronary artery disease)   Essential (primary) hypertension   Hyperthyroidism   Ulcerative colitis (HCC)   History of TIA (transient ischemic attack)   Moderate aortic stenosis   History of sarcoma s/p left AKA   Bilateral pleural effusion   Acute pulmonary embolism without acute cor pulmonale [facility])  Brief Hospital Course Summary: Misty Page is a 83 y.o. female with a PMH significant for HTN, HLD, ulcerative colitis, CAD s/p PCI for prior MI, left AKA for sarcoma 2020, recently hospitalized at [facility] (8/23 to 01/30/2024) for multiple pelvic fractures related to ground-level fall, treated nonoperatively and discharged to rehab on DVT prophylaxis with ASA 81 mg twice daily, now being admitted with acute PE with hypoxic respiratory failure.  Outpatient CTA chest showed small PE within segmental branches of right middle lobe with evidence of right heart strain (RV LV ratio 1.5) consistent with at least submassive PE.  Also showed moderate-sized bilateral pleural effusions and patchy airspace disease/cardiomegaly/pulmonary artery hypertension. Vascular surgery evaluated and recommended treatment with anticoagulation and no thrombectomy required.  IR evaluated for possible thoracentesis but  there is no safe window for approach so treating with diuretics at this time.  Patient's respiratory status improving. Has weaned from 4Lnc to room air now. BP remains severely elevated to 190s on home regimen so added hydralazine  to manage for now which has greatly improved her BP and can titrate as needed on outpatient follow up.  Currently still in first week of eliquis  loading dose which will be titrated down to treatment dose as listed below.    All other chronic conditions were treated with home medications.    Discharge Condition: Good, improved Recommended discharge diet: Regular healthy diet  Consultations: IR Vascular surgery   Procedures/Studies: None   Allergies as of 02/14/2024       Reactions   Amoxicillin Anaphylaxis   Cephalosporins Swelling, Rash   Lip swelling and rash on CTX and Vancomycin .   Naproxen Hives, Shortness Of Breath, Nausea And Vomiting, Swelling   Penicillins Shortness Of Breath, Swelling, Rash   Tolerated ceftriaxone  08/2022   Shellfish Allergy Diarrhea, Hives, Nausea And Vomiting, Swelling, Nausea Only, Other (See Comments)   Other Reaction(s): GI Intolerance   Vancomycin  Rash, Swelling, Dermatitis, Other (See Comments)   Occurred while on CTX and Vancomycin . More likely CTX as she has anaphylaxis to PCN, but should monitor closely. Other Reaction(s): Angioedema, Facial Edema (intolerance)    Occurred while on CTX and Vancomycin . More likely CTX as she has anaphylaxis to PCN, but should monitor closely.   Codeine Nausea And Vomiting, Nausea Only   Other Reaction(s): GI Intolerance   Levofloxacin Nausea And Vomiting   Other Reaction(s): GI Intolerance        Medication List     STOP taking these medications    clopidogrel  75 MG tablet Commonly known as:  PLAVIX    furosemide  40 MG tablet Commonly known as: LASIX        TAKE these medications    acetaminophen  500 MG tablet Commonly known as: TYLENOL  Take 500-1,000 mg by mouth  every 8 (eight) hours as needed.   amLODipine  5 MG tablet Commonly known as: NORVASC  TAKE 1 TABLET BY MOUTH DAILY   apixaban  5 MG Tabs tablet Commonly known as: ELIQUIS  Take 2 tablets (10 mg total) by mouth 2 (two) times daily for 4 days, THEN 1 tablet (5 mg total) 2 (two) times daily. Start taking on: February 14, 2024   aspirin  EC 81 MG tablet Take 81 mg by mouth 2 (two) times daily.   Calcium +D3 600-20 MG-MCG Tabs Generic drug: Calcium  Carb-Cholecalciferol  TAKE 1 TABLET BY MOUTH DAILY   ezetimibe  10 MG tablet Commonly known as: ZETIA  TAKE 1 TABLET BY MOUTH DAILY   famotidine  20 MG tablet Commonly known as: PEPCID  TAKE 1 TABLET BY MOUTH TWICE A DAY   gabapentin  300 MG capsule Commonly known as: NEURONTIN  TAKE ONE CAPSULE BY MOUTH TWICE A DAY   hydrALAZINE  50 MG tablet Commonly known as: APRESOLINE  Take 1 tablet (50 mg total) by mouth 3 (three) times daily.   lidocaine  5 % Commonly known as: LIDODERM  Place 1 patch onto the skin daily.   losartan  100 MG tablet Commonly known as: COZAAR  TAKE 1 TABLET BY MOUTH DAILY   Mesalamine  800 MG Tbec Take 800 mg by mouth in the morning, at noon, and at bedtime.   methimazole  5 MG tablet Commonly known as: TAPAZOLE  Take 5 mg by mouth. 1 Tablet on Mondays, Wednesdays and Fridays and take 1/2 tablet daily the rest of the week.   potassium chloride  SA 20 MEQ tablet Commonly known as: KLOR-CON  M Take 20 mEq by mouth 2 (two) times daily.   Probiotic-Prebiotic 1-250 BILLION-MG Caps TAKE 1 CAPSULE BY MOUTH DAILY   rosuvastatin  10 MG tablet Commonly known as: CRESTOR  TAKE 1 TABLET BY MOUTH DAILY   vitamin C  1000 MG tablet TAKE 1 TABLET BY MOUTH EVERY MORNING        Contact information for after-discharge care     Destination     [facility]   Service: Skilled Nursing Contact information: [address] [place]  [phone]                      Subjective   Pt reports feeling well today. Denies pain. No SOB at rest.   All questions and concerns were addressed at time of discharge.  Objective  Blood pressure (!) 149/66, pulse 91, temperature 99 F (37.2 C), resp. rate 15, height 5' (1.524 m), weight 58.1 kg, SpO2 94%.   General: Pt is alert, awake, not in acute distress Cardiovascular: RRR, S1/S2 +, no rubs, no gallops Respiratory: CTA bilaterally, no wheezing, no rhonchi Abdominal: Soft, NT, ND, bowel sounds + Extremities: no edema, no cyanosis. L AKA  The results of significant diagnostics from this hospitalization (including imaging, microbiology, ancillary and laboratory) are listed below for reference.   Imaging studies: US  CHEST (PLEURAL EFFUSION) Result Date: 02/10/2024 CLINICAL DATA:  83 year old female with small to moderate bilateral pleural effusions. EXAM: CHEST ULTRASOUND COMPARISON:  CT scan chest 02/09/2024 FINDINGS: Ultrasound evaluation of both the right and left chest demonstrates small to moderate pleural effusions. Unfortunately, lung flaps are present fluttering just below the pleural surface. A suitable window for access could not be identified. IMPRESSION: No suitable  window for either right or left-sided thoracentesis. Thoracentesis was deferred. Electronically Signed   By: Wilkie Lent M.D.   On: 02/10/2024 13:36   ECHOCARDIOGRAM COMPLETE Result Date: 02/10/2024    ECHOCARDIOGRAM REPORT   Patient Name:   Misty Page Date of Exam: 02/10/2024 Medical Rec #:  [id number]     Height:       60.0 in Accession #:    [id number]    Weight:       133.0 lb Date of Birth:  1941/04/20     BSA:          1.569 m Patient Age:    83 years      BP:           104/70 mmHg Patient Gender: F             HR:           80 bpm. Exam Location:  [facility] Procedure: 2D Echo, Cardiac Doppler and Color Doppler (Both Spectral and Color            Flow Doppler were utilized during procedure). Indications:     Pulmonary embolus I26.09   History:         Patient has prior history of Echocardiogram examinations, most                  recent 09/21/2022. Previous Myocardial Infarction,                  Signs/Symptoms:Murmur; Risk Factors:Hypertension.  Sonographer:     Christopher Furnace Referring Phys:  [id number] DELAYNE LULLA SOLIAN Diagnosing Phys: Marsa Dooms MD  Sonographer Comments: Image acquisition challenging due to breast implants. IMPRESSIONS  1. Left ventricular ejection fraction, by estimation, is 55 to 60%. The left ventricle has normal function. The left ventricle has no regional wall motion abnormalities. Indeterminate diastolic filling due to E-A fusion.  2. Right ventricular systolic function is normal. The right ventricular size is normal.  3. The mitral valve is normal in structure. Mild mitral valve regurgitation. No evidence of mitral stenosis.  4. The aortic valve is normal in structure. Aortic valve regurgitation is mild to moderate. Moderate aortic valve stenosis.  5. The inferior vena cava is normal in size with greater than 50% respiratory variability, suggesting right atrial pressure of 3 mmHg. FINDINGS  Left Ventricle: Left ventricular ejection fraction, by estimation, is 55 to 60%. The left ventricle has normal function. The left ventricle has no regional wall motion abnormalities. Strain was performed and the global longitudinal strain is indeterminate. The left ventricular internal cavity size was normal in size. There is no left ventricular hypertrophy. Indeterminate diastolic filling due to E-A fusion. Right Ventricle: The right ventricular size is normal. No increase in right ventricular wall thickness. Right ventricular systolic function is normal. Left Atrium: Left atrial size was normal in size. Right Atrium: Right atrial size was normal in size. Pericardium: There is no evidence of pericardial effusion. Mitral Valve: The mitral valve is normal in structure. Mild mitral valve regurgitation. No evidence of mitral valve  stenosis. Tricuspid Valve: The tricuspid valve is normal in structure. Tricuspid valve regurgitation is mild . No evidence of tricuspid stenosis. Aortic Valve: The aortic valve is normal in structure. Aortic valve regurgitation is mild to moderate. Moderate aortic stenosis is present. Aortic valve mean gradient measures 28.0 mmHg. Aortic valve peak gradient measures 53.1 mmHg. Aortic valve area, by VTI measures 1.41 cm. Pulmonic Valve: The pulmonic valve was normal  in structure. Pulmonic valve regurgitation is not visualized. No evidence of pulmonic stenosis. Aorta: The aortic root is normal in size and structure. Venous: The inferior vena cava is normal in size with greater than 50% respiratory variability, suggesting right atrial pressure of 3 mmHg. IAS/Shunts: No atrial level shunt detected by color flow Doppler. Additional Comments: 3D was performed not requiring image post processing on an independent workstation and was indeterminate.  LEFT VENTRICLE PLAX 2D LVIDd:         3.60 cm   Diastology LVIDs:         2.60 cm   LV e' medial:    10.10 cm/s LV PW:         1.30 cm   LV E/e' medial:  13.8 LV IVS:        1.60 cm   LV e' lateral:   10.40 cm/s LVOT diam:     2.10 cm   LV E/e' lateral: 13.4 LV SV:         109 LV SV Index:   69 LVOT Area:     3.46 cm  RIGHT VENTRICLE RV Basal diam:  4.10 cm RV Mid diam:    3.10 cm LEFT ATRIUM              Index        RIGHT ATRIUM           Index LA diam:        4.60 cm  2.93 cm/m   RA Area:     15.80 cm LA Vol (A2C):   104.0 ml 66.26 ml/m  RA Volume:   37.20 ml  23.70 ml/m LA Vol (A4C):   111.0 ml 70.72 ml/m LA Biplane Vol: 110.0 ml 70.09 ml/m  AORTIC VALVE AV Area (Vmax):    1.23 cm AV Area (Vmean):   1.37 cm AV Area (VTI):     1.41 cm AV Vmax:           364.33 cm/s AV Vmean:          239.333 cm/s AV VTI:            0.772 m AV Peak Grad:      53.1 mmHg AV Mean Grad:      28.0 mmHg LVOT Vmax:         129.00 cm/s LVOT Vmean:        95.000 cm/s LVOT VTI:          0.314  m LVOT/AV VTI ratio: 0.41  AORTA Ao Root diam: 2.90 cm MITRAL VALVE                TRICUSPID VALVE MV Area (PHT): 5.50 cm     TR Peak grad:   63.7 mmHg MV Decel Time: 138 msec     TR Vmax:        399.00 cm/s MV E velocity: 139.00 cm/s                             SHUNTS                             Systemic VTI:  0.31 m                             Systemic Diam: 2.10 cm Marsa Dooms MD Electronically signed by Marsa  Paraschos MD Signature Date/Time: 02/10/2024/1:01:06 PM    Final    US  Venous Img Lower Unilateral Right (DVT) Result Date: 02/10/2024 EXAM: ULTRASOUND DUPLEX OF THE RIGHT LOWER EXTREMITY VEINS TECHNIQUE: Duplex ultrasound using B-mode/gray scaled imaging and Doppler spectral analysis and color flow was obtained of the deep venous structures of the right lower extremity. COMPARISON: 11/16/2021 CLINICAL HISTORY: 141700 Pulmonary embolism (HCC) 141700. Pulmonary embolism (HCC) 141700. FINDINGS: The visualized veins of the lower extremity are patent and free of echogenic thrombus. The veins demonstrate good compressibility with normal color flow study and spectral analysis. IMPRESSION: 1. No evidence of DVT. Electronically signed by: Waddell Calk MD 02/10/2024 08:35 AM EDT RP Workstation: [id number]   CT Angio Chest Pulmonary Embolism (PE) W or WO Contrast Addendum Date: 02/09/2024 ADDENDUM REPORT: 02/09/2024 17:59 ADDENDUM: These results were called by telephone at the time of interpretation on 02/09/2024 at 5:58 pm to provider Dr. Ammon, who verbally acknowledged these results. Electronically Signed   By: Greig Pique M.D.   On: 02/09/2024 17:59   Result Date: 02/09/2024 CLINICAL DATA:  SOB EXAM: CT ANGIOGRAPHY CHEST WITH CONTRAST TECHNIQUE: Multidetector CT imaging of the chest was performed using the standard protocol during bolus administration of intravenous contrast. Multiplanar CT image reconstructions and MIPs were obtained to evaluate the vascular anatomy. RADIATION DOSE  REDUCTION: This exam was performed according to the departmental dose-optimization program which includes automated exposure control, adjustment of the mA and/or kV according to patient size and/or use of iterative reconstruction technique. CONTRAST:  75mL OMNIPAQUE  IOHEXOL  350 MG/ML SOLN COMPARISON:  CT PE protocol 02/14/2020. FINDINGS: Cardiovascular: There is adequate opacification of the pulmonary arteries to the segmental level. There small pulmonary emboli within segmental branches of the right middle lobe. No other pulmonary emboli are identified. Main pulmonary artery is enlarged compatible with pulmonary artery hypertension. The heart is enlarged. There is no pericardial effusion. There are atherosclerotic calcifications of the aorta and coronary arteries. There is a vascular stent in the region of the right brachiocephalic artery. Mediastinum/Nodes: There are punctate calcifications in the thyroid  gland. There is a hypodense left thyroid  nodule measuring 6 mm. There are scattered nonenlarged mediastinal lymph nodes diffusely. Visualized esophagus within normal limits. Lungs/Pleura: There are moderate-sized bilateral pleural effusions. Mild emphysema present. There are are mild patchy multifocal airspace and ill-defined nodular densities throughout the bilateral upper lobes. There some atelectasis in the bilateral lower lobes and small amount of airspace disease in the right lower lobe. Upper Abdomen: No acute abnormality. Musculoskeletal: Bilateral breast implants are present. No acute osseous abnormality. Stable mild compression deformity of L2 Review of the MIP images confirms the above findings. IMPRESSION: 1. Small pulmonary emboli within segmental branches of the right middle lobe. Positive for acute PE with CTevidence of right heart strain (RV/LV Ratio = 1.5) consistent with at least submassive (intermediate risk) PE. The presence of right heart strain has been associated with an increased risk of  morbidity and mortality. 2. Moderate-sized bilateral pleural effusions. 3. Mild patchy airspace and ill-defined nodular densities in the bilateral upper lobes and right lower lobe worrisome for multifocal pneumonia. 4. Cardiomegaly. 5. Findings compatible with pulmonary artery hypertension. Aortic Atherosclerosis (ICD10-I70.0) and Emphysema (ICD10-J43.9). Electronically Signed: By: Greig Pique M.D. On: 02/09/2024 17:47    Labs: Basic Metabolic Panel: Recent Labs  Lab 02/09/24 1914 02/12/24 0442 02/13/24 0346  NA 134* 137 138  K 3.3* 2.9* 4.3  CL 102 102 104  CO2 20* 26 27  GLUCOSE 122* 102* 91  BUN 23 18 15   CREATININE 0.71 0.38* 0.46  CALCIUM  8.3* 7.9* 7.8*   CBC: Recent Labs  Lab 02/09/24 1914 02/11/24 0450 02/12/24 0442 02/13/24 0346  WBC 11.0* 8.7 6.9 6.7  HGB 11.8* 10.7* 9.8* 9.8*  HCT 34.6* 31.5* 29.2* 29.9*  MCV 93.3 92.6 92.7 95.5  PLT 467* 442* 392 380   Microbiology: Results for orders placed or performed during the hospital encounter of 02/09/24  Blood culture (routine x 2)     Status: None   Collection Time: 02/09/24  7:14 PM   Specimen: BLOOD  Result Value Ref Range Status   Specimen Description BLOOD RIGHT ANTECUBITAL  Final   Special Requests   Final    BOTTLES DRAWN AEROBIC AND ANAEROBIC Blood Culture results may not be optimal due to an inadequate volume of blood received in culture bottles   Culture   Final    NO GROWTH 5 DAYS Performed at [facility], [address] [place], [name] [address]    Report Status 02/14/2024 FINAL  Final  Resp panel by RT-PCR (RSV, Flu A&B, Covid) Anterior Nasal Swab     Status: None   Collection Time: 02/09/24  7:14 PM   Specimen: Anterior Nasal Swab  Result Value Ref Range Status   SARS Coronavirus 2 by RT PCR NEGATIVE NEGATIVE Final    Comment: (NOTE) SARS-CoV-2 target nucleic acids are NOT DETECTED.  The SARS-CoV-2 RNA is generally detectable in upper respiratory specimens during the acute phase  of infection. The lowest concentration of SARS-CoV-2 viral copies this assay can detect is 138 copies/mL. A negative result does not preclude SARS-Cov-2 infection and should not be used as the sole basis for treatment or other patient management decisions. A negative result may occur with  improper specimen collection/handling, submission of specimen other than nasopharyngeal swab, presence of viral mutation(s) within the areas targeted by this assay, and inadequate number of viral copies(<138 copies/mL). A negative result must be combined with clinical observations, patient history, and epidemiological information. The expected result is Negative.  Fact Sheet for Patients:  BloggerCourse.com  Fact Sheet for Healthcare Providers:  SeriousBroker.it  This test is no t yet approved or cleared by the [place]  FDA and  has been authorized for detection and/or diagnosis of SARS-CoV-2 by FDA under an Emergency Use Authorization (EUA). This EUA will remain  in effect (meaning this test can be used) for the duration of the COVID-19 declaration under Section 564(b)(1) of the Act, 21 U.S.C.section 360bbb-3(b)(1), unless the authorization is terminated  or revoked sooner.       Influenza A by PCR NEGATIVE NEGATIVE Final   Influenza B by PCR NEGATIVE NEGATIVE Final    Comment: (NOTE) The Xpert Xpress SARS-CoV-2/FLU/RSV plus assay is intended as an aid in the diagnosis of influenza from Nasopharyngeal swab specimens and should not be used as a sole basis for treatment. Nasal washings and aspirates are unacceptable for Xpert Xpress SARS-CoV-2/FLU/RSV testing.  Fact Sheet for Patients: BloggerCourse.com  Fact Sheet for Healthcare Providers: SeriousBroker.it  This test is not yet approved or cleared by the [place]  FDA and has been authorized for detection and/or diagnosis of  SARS-CoV-2 by FDA under an Emergency Use Authorization (EUA). This EUA will remain in effect (meaning this test can be used) for the duration of the COVID-19 declaration under Section 564(b)(1) of the Act, 21 U.S.C. section 360bbb-3(b)(1), unless the authorization is terminated or revoked.     Resp Syncytial Virus by PCR NEGATIVE NEGATIVE  Final    Comment: (NOTE) Fact Sheet for Patients: BloggerCourse.com  Fact Sheet for Healthcare Providers: SeriousBroker.it  This test is not yet approved or cleared by the [place]  FDA and has been authorized for detection and/or diagnosis of SARS-CoV-2 by FDA under an Emergency Use Authorization (EUA). This EUA will remain in effect (meaning this test can be used) for the duration of the COVID-19 declaration under Section 564(b)(1) of the Act, 21 U.S.C. section 360bbb-3(b)(1), unless the authorization is terminated or revoked.  Performed at [facility], [address] [place], [name] [address]   Blood culture (routine x 2)     Status: None   Collection Time: 02/09/24  8:47 PM   Specimen: BLOOD  Result Value Ref Range Status   Specimen Description BLOOD BLOOD RIGHT FOREARM  Final   Special Requests   Final    BOTTLES DRAWN AEROBIC AND ANAEROBIC Blood Culture adequate volume   Culture   Final    NO GROWTH 5 DAYS Performed at [facility], [address], [place], [name] [address]    Report Status 02/14/2024 FINAL  Final  MRSA Next Gen by PCR, Nasal     Status: None   Collection Time: 02/09/24 10:39 PM   Specimen: Nasal Mucosa; Nasal Swab  Result Value Ref Range Status   MRSA by PCR Next Gen NOT DETECTED NOT DETECTED Final    Comment: (NOTE) The GeneXpert MRSA Assay (FDA approved for NASAL specimens only), is one component of a comprehensive MRSA colonization surveillance program. It is not intended to diagnose MRSA infection nor to guide or monitor treatment  for MRSA infections. Test performance is not FDA approved in patients less than 41 years old. Performed at [facility], [address] [place], [name] [address]     Time coordinating discharge: Over 30 minutes  Marien LITTIE Piety, MD  [facility] 02/14/2024, 1:12 PM

## 2024-02-17 DIAGNOSIS — R011 Cardiac murmur, unspecified: Secondary | ICD-10-CM | POA: Diagnosis not present

## 2024-02-17 DIAGNOSIS — I2609 Other pulmonary embolism with acute cor pulmonale: Secondary | ICD-10-CM | POA: Diagnosis not present

## 2024-02-17 DIAGNOSIS — R0602 Shortness of breath: Secondary | ICD-10-CM | POA: Diagnosis not present

## 2024-02-17 DIAGNOSIS — Z89612 Acquired absence of left leg above knee: Secondary | ICD-10-CM | POA: Diagnosis not present

## 2024-02-22 DIAGNOSIS — J069 Acute upper respiratory infection, unspecified: Secondary | ICD-10-CM | POA: Diagnosis not present

## 2024-02-27 ENCOUNTER — Telehealth: Payer: Self-pay

## 2024-02-27 NOTE — Transitions of Care (Post Inpatient/ED Visit) (Signed)
 02/27/2024  Name: Misty Page MRN: [id number] DOB: June 25, 1940  Today's TOC FU Call Status: Today's TOC FU Call Status:: Successful TOC FU Call Completed TOC FU Call Complete Date: 02/27/24 Patient's Name and Date of Birth confirmed.  Transition Care Management Follow-up Telephone Call Date of Discharge: 02/24/24 Discharge Facility: Other [facility]) Name of Other (Non[facility]) Discharge Facility: [facility] Type of Discharge: Inpatient Admission Primary Inpatient Discharge Diagnosis:: colitis How have you been since you were released from the hospital?: Better Any questions or concerns?: No  Items Reviewed: Did you receive and understand the discharge instructions provided?: Yes Medications obtained,verified, and reconciled?: Yes (Medications Reviewed) Any new allergies since your discharge?: No Dietary orders reviewed?: Yes Do you have support at home?: Yes Name of Support/Comfort Primary Source: caregiver  Medications Reviewed Today: Medications Reviewed Today     Reviewed by Emmitt Pan, LPN (Licensed Practical Nurse) on 02/27/24 at 0900  Med List Status: <None>   Medication Order Taking? Sig Documenting Provider Last Dose Status Informant  acetaminophen  (TYLENOL ) 500 MG tablet [id number] Yes Take 500-1,000 mg by mouth every 8 (eight) hours as needed. [provider]  Active Child, Pharmacy Records  amLODipine  (NORVASC ) 5 MG tablet [id number] Yes TAKE 1 TABLET BY MOUTH DAILY [name], Angela M, MD  Active Child, Pharmacy Records  apixaban  (ELIQUIS ) 5 MG TABS tablet [id number] Yes Take 2 tablets (10 mg total) by mouth 2 (two) times daily for 4 days, THEN 1 tablet (5 mg total) 2 (two) times daily. Lenon Marien CROME, MD  Active   Ascorbic Acid  (VITAMIN C ) 1000 MG tablet [id number] Yes TAKE 1 TABLET BY MOUTH EVERY MORNING [name], MD  Active Child, Pharmacy Records  aspirin  EC 81 MG tablet [id number] Yes Take 81 mg by mouth 2 (two) times daily.  [provider]  Active Child, Pharmacy Records  Bacillus Coagulans-Inulin (PROBIOTIC-PREBIOTIC) 1-250 BILLION-MG CAPS [id number] Yes TAKE 1 CAPSULE BY MOUTH DAILY [name], [name], MD  Active Child, Pharmacy Records  CALCIUM +D3 600-20 MG-MCG TABS [id number] Yes TAKE 1 TABLET BY MOUTH DAILY [name], [name], MD  Active Child, Pharmacy Records  ezetimibe  (ZETIA ) 10 MG tablet [id number] Yes TAKE 1 TABLET BY MOUTH DAILY [name], Angela M, MD  Active Child, Pharmacy Records  famotidine  (PEPCID ) 20 MG tablet [id number] Yes TAKE 1 TABLET BY MOUTH TWICE A DAY [name], [name], MD  Active Child, Pharmacy Records  gabapentin  (NEURONTIN ) 300 MG capsule [id number] Yes TAKE ONE CAPSULE BY MOUTH TWICE A DAY [name], MD  Active Child, Pharmacy Records  hydrALAZINE  (APRESOLINE ) 50 MG tablet [id number] Yes Take 1 tablet (50 mg total) by mouth 3 (three) times daily. Lenon Marien CROME, MD  Active   lidocaine  (LIDODERM ) 5 % [id number] Yes Place 1 patch onto the skin daily. [provider]  Active Child, Pharmacy Records  losartan  (COZAAR ) 100 MG tablet [id number] Yes TAKE 1 TABLET BY MOUTH DAILY [name], Angela M, MD  Active Child, Pharmacy Records  Mesalamine  800 MG TBEC [id number] Yes Take 800 mg by mouth in the morning, at noon, and at bedtime. [provider]  Active Child, Pharmacy Records  methimazole  (TAPAZOLE ) 5 MG tablet [id number] Yes Take 5 mg by mouth. 1 Tablet on Mondays, Wednesdays and Fridays and take 1/2 tablet daily the rest of the week. [provider]  Active Child, Pharmacy Records  potassium chloride  SA (KLOR-CON  M) 20 MEQ tablet [id number] Yes Take 20 mEq by mouth 2 (two) times daily. [provider]  Active Child,  Pharmacy Records  rosuvastatin  (CRESTOR ) 10 MG tablet [id number] Yes TAKE 1 TABLET BY MOUTH DAILY [name], [name], MD  Active Child, Pharmacy Records            Home Care and Equipment/Supplies: Were Home Health  Services Ordered?: Yes Name of Home Health Agency:: unknown Has Agency set up a time to come to your home?: Yes First Home Health Visit Date: 02/26/24 Any new equipment or medical supplies ordered?: NA  Functional Questionnaire: Do you need assistance with bathing/showering or dressing?: No Do you need assistance with meal preparation?: No Do you need assistance with eating?: No Do you have difficulty maintaining continence: No Do you need assistance with getting out of bed/getting out of a chair/moving?: No Do you have difficulty managing or taking your medications?: No  Follow up appointments reviewed: PCP Follow-up appointment confirmed?: Yes Date of PCP follow-up appointment?: 02/28/24 Follow-up Provider: [facility] Follow-up appointment confirmed?: NA Do you need transportation to your follow-up appointment?: No Do you understand care options if your condition(s) worsen?: Yes-patient verbalized understanding    SIGNATURE Julian Lemmings, LPN [facility] Nurse Health Advisor Direct Dial [phone]

## 2024-02-28 ENCOUNTER — Ambulatory Visit: Admitting: Family Medicine

## 2024-02-28 ENCOUNTER — Encounter: Payer: Self-pay | Admitting: Family Medicine

## 2024-02-28 VITALS — BP 122/62 | HR 89 | Ht 60.0 in | Wt 122.0 lb

## 2024-02-28 DIAGNOSIS — R0981 Nasal congestion: Secondary | ICD-10-CM

## 2024-02-28 DIAGNOSIS — I739 Peripheral vascular disease, unspecified: Secondary | ICD-10-CM

## 2024-02-28 DIAGNOSIS — E7800 Pure hypercholesterolemia, unspecified: Secondary | ICD-10-CM

## 2024-02-28 DIAGNOSIS — S3282XD Multiple fractures of pelvis without disruption of pelvic ring, subsequent encounter for fracture with routine healing: Secondary | ICD-10-CM

## 2024-02-28 DIAGNOSIS — I10 Essential (primary) hypertension: Secondary | ICD-10-CM | POA: Diagnosis not present

## 2024-02-28 DIAGNOSIS — M810 Age-related osteoporosis without current pathological fracture: Secondary | ICD-10-CM

## 2024-02-28 DIAGNOSIS — I2609 Other pulmonary embolism with acute cor pulmonale: Secondary | ICD-10-CM

## 2024-02-28 DIAGNOSIS — Z23 Encounter for immunization: Secondary | ICD-10-CM | POA: Diagnosis not present

## 2024-02-28 MED ORDER — VITAMIN C 1000 MG PO TABS: 1000.0000 mg | ORAL_TABLET | Freq: Every morning | ORAL | 1 refills | Status: AC

## 2024-02-28 MED ORDER — PROBIOTIC-PREBIOTIC 1-250 BILLION-MG PO CAPS: 1.0000 | ORAL_CAPSULE | Freq: Every day | ORAL | Status: AC

## 2024-02-28 MED ORDER — FAMOTIDINE 20 MG PO TABS: 20.0000 mg | ORAL_TABLET | Freq: Two times a day (BID) | ORAL | 1 refills | Status: AC

## 2024-02-28 MED ORDER — ROSUVASTATIN CALCIUM 10 MG PO TABS: 10.0000 mg | ORAL_TABLET | Freq: Every day | ORAL | 1 refills | Status: AC

## 2024-02-28 MED ORDER — AMLODIPINE BESYLATE 5 MG PO TABS: 5.0000 mg | ORAL_TABLET | Freq: Every day | ORAL | 3 refills | Status: AC

## 2024-02-28 MED ORDER — GABAPENTIN 300 MG PO CAPS: 300.0000 mg | ORAL_CAPSULE | Freq: Two times a day (BID) | ORAL | 3 refills | Status: AC

## 2024-02-28 MED ORDER — HYDRALAZINE HCL 50 MG PO TABS: 50.0000 mg | ORAL_TABLET | Freq: Three times a day (TID) | ORAL | 1 refills | Status: DC

## 2024-02-28 MED ORDER — METHIMAZOLE 5 MG PO TABS: ORAL_TABLET | ORAL | 3 refills | Status: AC

## 2024-02-28 MED ORDER — EZETIMIBE 10 MG PO TABS: 10.0000 mg | ORAL_TABLET | Freq: Every day | ORAL | 1 refills | Status: AC

## 2024-02-28 MED ORDER — LOSARTAN POTASSIUM 100 MG PO TABS: 100.0000 mg | ORAL_TABLET | Freq: Every day | ORAL | 1 refills | Status: AC

## 2024-02-28 MED ORDER — LIDOCAINE 5 % EX PTCH
1.0000 | MEDICATED_PATCH | CUTANEOUS | 2 refills | Status: AC
Start: 2024-02-28 — End: ?

## 2024-02-28 MED ORDER — APIXABAN 5 MG PO TABS: 5.0000 mg | ORAL_TABLET | Freq: Two times a day (BID) | ORAL | 1 refills | Status: AC

## 2024-02-28 MED ORDER — POTASSIUM CHLORIDE CRYS ER 20 MEQ PO TBCR: 20.0000 meq | EXTENDED_RELEASE_TABLET | Freq: Two times a day (BID) | ORAL | 1 refills | Status: DC

## 2024-02-28 MED ORDER — MESALAMINE 800 MG PO TBEC: 800.0000 mg | DELAYED_RELEASE_TABLET | Freq: Three times a day (TID) | ORAL | 1 refills | Status: AC

## 2024-02-28 MED ORDER — CALCIUM CARB-CHOLECALCIFEROL 600-20 MG-MCG PO TABS: 1.0000 | ORAL_TABLET | Freq: Every day | ORAL | 1 refills | Status: AC

## 2024-02-28 NOTE — Progress Notes (Signed)
 Established patient visit   Patient: Misty Page   DOB: 02-May-1941   83 y.o. Female  MRN: [id number] Visit Date: 02/28/2024  Today's healthcare provider: Jon Eva, MD   Chief Complaint  Patient presents with   Hospitalization Follow-up    Patient was admitted to [facility] on 02/09/2024 to 02/14/2024. Patient reports that things are getting better but not completely resolved.   Immunizations    Patient reports she would like the influenza vaccine but only if the provider feels like it is best since she has been under the weather the last few weeks    Subjective    HPI HPI     Hospitalization Follow-up    Additional comments: Patient was admitted to [facility] on 02/09/2024 to 02/14/2024. Patient reports that things are getting better but not completely resolved.        Immunizations    Additional comments: Patient reports she would like the influenza vaccine but only if the provider feels like it is best since she has been under the weather the last few weeks       Last edited by Lilian Fitzpatrick, CMA on 02/28/2024  9:58 AM.       Discussed the use of AI scribe software for clinical note transcription with the patient, who gave verbal consent to proceed.  History of Present Illness   Misty Page is an 83 year old female with pelvic fractures and pulmonary embolism who presents for medication management and follow-up.  On January 21, 2024, she experienced a fall resulting in pelvic fractures and was hospitalized at [facility] for treatment, including pain management with lidocaine  patches. Discontinuation attempts led to significant mobility issues, particularly with activities like getting off the toilet. She continues to use lidocaine  patches for pain control.  Following the fall, she was initially placed on aspirin  due to immobilization risk but was later hospitalized on February 09, 2024, for a pulmonary embolism. She was started on Eliquis  5 mg twice daily for  anticoagulation and reports significant improvement in breathing since then. She is no longer on Lasix  and continues with Eliquis .  She has had a cold for over a week with significant congestion and 'green mess' in her head, causing concern due to her pulmonary history. She feels congested in her face and has not attempted sinus rinsing. She is not on antibiotics and is monitoring her symptoms.  Her current medications include amlodipine , losartan , hydralazine , methimazole , potassium, rosuvastatin , Zetia , Pepcid , gabapentin , and a probiotic. She uses Tylenol  as needed for pain, up to three times a day, and is no longer on aspirin  since starting Eliquis .  She receives home health services, including physical and occupational therapy, to assist with rehabilitation and mobility. She has necessary equipment at home, such as a bedside commode and shower chair.         Medications: Outpatient Medications Prior to Visit  Medication Sig   acetaminophen  (TYLENOL ) 500 MG tablet Take 500-1,000 mg by mouth every 8 (eight) hours as needed.   [DISCONTINUED] amLODipine  (NORVASC ) 5 MG tablet TAKE 1 TABLET BY MOUTH DAILY   [DISCONTINUED] apixaban  (ELIQUIS ) 5 MG TABS tablet Take 2 tablets (10 mg total) by mouth 2 (two) times daily for 4 days, THEN 1 tablet (5 mg total) 2 (two) times daily.   [DISCONTINUED] Ascorbic Acid  (VITAMIN C ) 1000 MG tablet TAKE 1 TABLET BY MOUTH EVERY MORNING   [DISCONTINUED] Bacillus Coagulans-Inulin (PROBIOTIC-PREBIOTIC) 1-250 BILLION-MG CAPS TAKE 1 CAPSULE BY MOUTH DAILY   [  DISCONTINUED] CALCIUM +D3 600-20 MG-MCG TABS TAKE 1 TABLET BY MOUTH DAILY   [DISCONTINUED] ezetimibe  (ZETIA ) 10 MG tablet TAKE 1 TABLET BY MOUTH DAILY   [DISCONTINUED] famotidine  (PEPCID ) 20 MG tablet TAKE 1 TABLET BY MOUTH TWICE A DAY   [DISCONTINUED] gabapentin  (NEURONTIN ) 300 MG capsule TAKE ONE CAPSULE BY MOUTH TWICE A DAY   [DISCONTINUED] hydrALAZINE  (APRESOLINE ) 50 MG tablet Take 1 tablet (50 mg total) by  mouth 3 (three) times daily.   [DISCONTINUED] lidocaine  (LIDODERM ) 5 % Place 1 patch onto the skin daily.   [DISCONTINUED] losartan  (COZAAR ) 100 MG tablet TAKE 1 TABLET BY MOUTH DAILY   [DISCONTINUED] Mesalamine  800 MG TBEC Take 800 mg by mouth in the morning, at noon, and at bedtime.   [DISCONTINUED] methimazole  (TAPAZOLE ) 5 MG tablet Take 5 mg by mouth. 1 Tablet on Mondays, Wednesdays and Fridays and take 1/2 tablet daily the rest of the week.   [DISCONTINUED] potassium chloride  SA (KLOR-CON  M) 20 MEQ tablet Take 20 mEq by mouth 2 (two) times daily.   [DISCONTINUED] rosuvastatin  (CRESTOR ) 10 MG tablet TAKE 1 TABLET BY MOUTH DAILY   [DISCONTINUED] aspirin  EC 81 MG tablet Take 81 mg by mouth 2 (two) times daily. (Patient not taking: Reported on 02/28/2024)   No facility-administered medications prior to visit.    Review of Systems     Objective    BP 122/62 (BP Location: Right Arm, Patient Position: Sitting, Cuff Size: Normal)   Pulse 89   Ht 5' (1.524 m)   Wt 122 lb (55.3 kg)   SpO2 95%   BMI 23.83 kg/m    Physical Exam Vitals reviewed.  Constitutional:      General: She is not in acute distress.    Appearance: Normal appearance. She is well-developed. She is not diaphoretic.     Comments: In wheelchair  HENT:     Head: Normocephalic and atraumatic.  Eyes:     General: No scleral icterus.    Conjunctiva/sclera: Conjunctivae normal.  Neck:     Thyroid : No thyromegaly.  Cardiovascular:     Rate and Rhythm: Normal rate and regular rhythm.     Heart sounds: Normal heart sounds. No murmur heard. Pulmonary:     Effort: Pulmonary effort is normal. No respiratory distress.     Breath sounds: Normal breath sounds. No wheezing, rhonchi or rales.  Musculoskeletal:     Cervical back: Neck supple.     Right lower leg: No edema.     Comments: L leg surgically absent s/p amputation  Lymphadenopathy:     Cervical: No cervical adenopathy.  Skin:    General: Skin is warm and dry.      Findings: No rash.  Neurological:     Mental Status: She is alert and oriented to person, place, and time. Mental status is at baseline.  Psychiatric:        Mood and Affect: Mood normal.        Behavior: Behavior normal.      No results found for any visits on 02/28/24.  Assessment & Plan     Problem List Items Addressed This Visit       Cardiovascular and Mediastinum   Essential (primary) hypertension   Relevant Medications   amLODipine  (NORVASC ) 5 MG tablet   apixaban  (ELIQUIS ) 5 MG TABS tablet   ezetimibe  (ZETIA ) 10 MG tablet   hydrALAZINE  (APRESOLINE ) 50 MG tablet   losartan  (COZAAR ) 100 MG tablet   rosuvastatin  (CRESTOR ) 10 MG tablet   Peripheral vascular  disease   Relevant Medications   amLODipine  (NORVASC ) 5 MG tablet   apixaban  (ELIQUIS ) 5 MG TABS tablet   ezetimibe  (ZETIA ) 10 MG tablet   hydrALAZINE  (APRESOLINE ) 50 MG tablet   losartan  (COZAAR ) 100 MG tablet   rosuvastatin  (CRESTOR ) 10 MG tablet   Acute pulmonary embolism with acute cor pulmonale (HCC)   Relevant Medications   amLODipine  (NORVASC ) 5 MG tablet   apixaban  (ELIQUIS ) 5 MG TABS tablet   ezetimibe  (ZETIA ) 10 MG tablet   hydrALAZINE  (APRESOLINE ) 50 MG tablet   losartan  (COZAAR ) 100 MG tablet   rosuvastatin  (CRESTOR ) 10 MG tablet     Musculoskeletal and Integument   OP (osteoporosis)   Relevant Medications   Calcium  Carb-Cholecalciferol  (CALCIUM +D3) 600-20 MG-MCG TABS     Other   Hypercholesteremia   Relevant Medications   amLODipine  (NORVASC ) 5 MG tablet   apixaban  (ELIQUIS ) 5 MG TABS tablet   ezetimibe  (ZETIA ) 10 MG tablet   hydrALAZINE  (APRESOLINE ) 50 MG tablet   losartan  (COZAAR ) 100 MG tablet   rosuvastatin  (CRESTOR ) 10 MG tablet   Other Visit Diagnoses       Multiple closed fractures of pelvis without disruption of pelvic ring with routine healing, subsequent encounter    -  Primary   Relevant Orders   Ambulatory referral to Orthopedic Surgery     Immunization due        Relevant Orders   Flu vaccine HIGH DOSE PF(Fluzone Trivalent) (Completed)     Sinus congestion              Pelvic fractures with chronic pain   Pelvic fractures sustained on January 21, 2024, with ongoing chronic pain. Pain is well-managed with lidocaine  patches, which provide significant relief. Attempted discontinuation of patches resulted in increased pain, indicating her effectiveness.   - Refer to orthopedist for follow-up on pelvic fractures   - Continue lidocaine  patches for pain management   - Ensure refills on lidocaine  patches   - Use Tylenol  as needed for additional pain control, up to two tablets every eight hours    Pulmonary embolism, provoked, on anticoagulation   Provoked pulmonary embolism diagnosed on February 09, 2024, likely secondary to pelvic fractures and immobilization. Significant improvement in breathing after hospitalization and fluid management. Plan to continue anticoagulation for at least six months, with a follow-up CT scan in March 2026 to assess clot resolution. Transition back to Plavix  after confirming clot resolution.   - Continue Eliquis  5 mg twice daily   - Order CT scan of chest in March 2026 to assess clot resolution   - Plan to transition back to Plavix  after confirming clot resolution    Congestive symptoms due to upper respiratory infection   Upper respiratory infection with congestion and phlegm, ongoing for over a week. Symptoms include facial congestion and cough, likely due to sinus drainage. No current indication for antibiotics unless symptoms persist beyond ten days or worsen.   - Advise use of neti pot for sinus rinsing   - Monitor symptoms and consider antibiotics if no improvement by the end of the week    Hypertension   Hypertension management adjusted during hospitalization. Currently on amlodipine , hydralazine , and losartan . Blood pressure was unstable during hospitalization but is now managed with current regimen.   - Continue  current antihypertensive regimen: amlodipine , hydralazine , and losartan     Hyperlipidemia   Hyperlipidemia managed with Zetia  and rosuvastatin .   - Continue Zetia  10 mg daily   - Continue rosuvastatin  10 mg daily  Hypothyroidism   Hypothyroidism managed with methimazole . Dosage adjusted with full pill on Monday, Wednesday, and Friday, and half pill on other days.   - Continue methimazole  as prescribed: full pill on Monday, Wednesday, and Friday, half pill on other days    Peripheral artery disease   Peripheral artery disease previously managed with Plavix , currently on hold due to anticoagulation with Eliquis  for pulmonary embolism.   - Plan to resume Plavix  after confirming resolution of pulmonary embolism    General Health Maintenance   Flu vaccination discussed and deemed appropriate after assessing respiratory status.   - Administer flu shot    Follow-up   Next follow-up appointment initially scheduled for two weeks, but deemed unnecessary. Plan to reschedule for three months to monitor ongoing conditions and treatment efficacy.   - Cancel two-week follow-up appointment   - Schedule follow-up appointment in three months          Return in about 3 months (around 05/29/2024) for chronic disease f/u.       Jon Eva, MD  [facility] [phone] (phone) [phone] (fax)  [facility]

## 2024-02-29 ENCOUNTER — Telehealth: Payer: Self-pay

## 2024-02-29 ENCOUNTER — Other Ambulatory Visit (HOSPITAL_COMMUNITY): Payer: Self-pay

## 2024-02-29 NOTE — Telephone Encounter (Signed)
 Pharmacy Patient Advocate Encounter   Received notification from Physician's Office that prior authorization for Lidocaine  5% patches is required/requested.   Insurance verification completed.   The patient is insured through [employer].   Per test claim: Medication is only covered by Medicare Part D for the following:  A) pain associated with post-herpetic neuralgia, B) pain associated with diabetic neuropathy, C) pain associated with cancer-related neuropathy (including treatment-related neuropathy [e.g., neuropathy associated with radiation treatment or chemotherapy]

## 2024-02-29 NOTE — Telephone Encounter (Unsigned)
 Copied from CRM [id number]. Topic: Clinical - Medication Question >> Feb 29, 2024  9:28 AM Joesph NOVAK wrote: Reason for CRM: Patients daugher received a call from pharmacy, she needs clarification on a few medications. Please call her asap. Call back number [phone]. Carlyon Seip.

## 2024-02-29 NOTE — Telephone Encounter (Signed)
 Paoge reports pharmacy contacted her about 5:00 pm yesterday statig they needed rx's for the patients lidocaine  patches, eliquis  and hydralazine  and for her to double check that the patient is to be taking potassium although she is not on lasix . I advised Carlyon that all rx's were signed and sent electronically but I would make provider aware. She verbalized understanding and said she spoke with Alan.

## 2024-03-01 ENCOUNTER — Other Ambulatory Visit: Payer: Self-pay

## 2024-03-01 ENCOUNTER — Ambulatory Visit: Payer: Self-pay | Admitting: *Deleted

## 2024-03-01 MED ORDER — HYDRALAZINE HCL 50 MG PO TABS: 50.0000 mg | ORAL_TABLET | Freq: Three times a day (TID) | ORAL | 1 refills | Status: DC

## 2024-03-01 NOTE — Telephone Encounter (Signed)
 Pharmacist advised. Reports he will relay message to Erminio as that is who I spoke with yesterday. RX resent

## 2024-03-01 NOTE — Telephone Encounter (Signed)
 Ok to resend hydralazine  Yes eliquis  replaces plavix  for now - this may change again in 6 months Ok to continue potassium - K was normal on last labs - we can recheck bmp in 2 weeks (please order)

## 2024-03-01 NOTE — Telephone Encounter (Signed)
 Call received from [facility], from [facility] #[phone] requesting if oral medication can be prescribed due to tylenol  not effective. See NT encounter.    FYI Only or Action Required?: Action required by provider: update on patient condition and requesting pain medication due to patient has not been able to get lidocaine  patches OTC per Powell with medicare case management.  Patient was last seen in primary care on 02/28/2024 by [name], MD.  Called Nurse Triage reporting Pain.  Symptoms began several weeks ago.  Interventions attempted: OTC medications: tylenol  not effective and Rest, hydration, or home remedies.  Symptoms are: gradually worsening.  Triage Disposition: See Physician Within 24 Hours  Patient/caregiver understands and will follow disposition?: No, wishes to speak with PCP             Copied from CRM #[id number]. Topic: Clinical - Red Word Triage >> Mar 01, 2024 12:10 PM Deaijah H wrote: Red Word that prompted transfer to Nurse Triage: Powell Nurse Case manager w/ [facility] called in to let Dr. Myrla know patient is in a lot of pain (range from 7-10), struggling to sleep and move. Ortho appointment not until 2 wks and will not prescribe medication until visit. Stated it's getting in between her therapy, struggling to participate. Would like to know if something for pain may be sent it. Reason for Disposition  [1] MODERATE (e.g., interferes with normal activities) pelvic pain AND [2] pain comes and goes (cramps) AND [3] present > 24 hours  Answer Assessment - Initial Assessment Questions 1. LOCATION: Where does it hurt?      Per caller Powell from [facility] case manager with medicare reports pelvic pain from a break 2. RADIATION: Does the pain shoot anywhere else? (e.g., lower back, groin, thighs)     Na  3. ONSET: When did the pain begin? (e.g., minutes, hours or days ago)      No report has been in Rehab  4. SUDDEN: Gradual or sudden  onset?     na 5. PATTERN Does the pain come and go, or is it constant?     na 6. SEVERITY: How bad is the pain?  (e.g., Scale 1-10; mild, moderate, or severe)     Worsening pain and tylenol  not effective. 7. RECURRENT SYMPTOM: Have you ever had this type of pelvic pain before? If Yes, ask: When was the last time? and What happened that time?      Yes see Rehab admission  8. CAUSE: What do you think is causing the pelvic pain?     Broke pelvis 9. RELIEVING/AGGRAVATING FACTORS: What makes it better or worse? (e.g., activity/rest, sexual intercourse, voiding, passing stool)     Worse pain with movement  10. OTHER SYMPTOMS: Has there been any other symptoms? (e.g., fever, constipation, diarrhea, urine problems, vaginal bleeding, vaginal discharge, or vomiting?       Not sleeping or able to move well due to pain  11. PREGNANCY: Is there any chance you are pregnant? When was your last menstrual period?       na  Protocols used: Pelvic Pain - Female-A-AH

## 2024-03-01 NOTE — Telephone Encounter (Signed)
 Pt daughter made aware. Verbalized understanding.

## 2024-03-01 NOTE — Telephone Encounter (Signed)
 PA was denied as medicare does not cover it for this indication - recommend OTC lidocaine  patches instead.

## 2024-03-02 NOTE — Telephone Encounter (Signed)
 Patient advised.

## 2024-03-05 ENCOUNTER — Other Ambulatory Visit: Payer: Self-pay | Admitting: Family Medicine

## 2024-03-05 MED ORDER — OXYCODONE HCL 5 MG PO TABS: 5.0000 mg | ORAL_TABLET | Freq: Four times a day (QID) | ORAL | 0 refills | Status: DC | PRN

## 2024-03-05 NOTE — Telephone Encounter (Signed)
Rx for oxycodone sent to pharmacy

## 2024-03-06 ENCOUNTER — Other Ambulatory Visit (HOSPITAL_COMMUNITY): Payer: Self-pay

## 2024-03-07 ENCOUNTER — Ambulatory Visit: Payer: Self-pay

## 2024-03-07 ENCOUNTER — Other Ambulatory Visit: Payer: Self-pay

## 2024-03-07 DIAGNOSIS — J069 Acute upper respiratory infection, unspecified: Secondary | ICD-10-CM

## 2024-03-07 MED ORDER — DOXYCYCLINE HYCLATE 100 MG PO TABS: 100.0000 mg | ORAL_TABLET | Freq: Two times a day (BID) | ORAL | 0 refills | Status: DC

## 2024-03-07 NOTE — Telephone Encounter (Signed)
 Please send in doxycycline  100mg  BID x7d #14 [id number]

## 2024-03-07 NOTE — Addendum Note (Signed)
 Addended by: LILIAN SEVERO RAMAN on: 03/07/2024 01:53 PM   Modules accepted: Orders

## 2024-03-07 NOTE — Telephone Encounter (Signed)
 Rx sent.

## 2024-03-07 NOTE — Telephone Encounter (Signed)
 FYI Only or Action Required?: Action required by provider: update on patient condition.  Patient was last seen in primary care on 02/28/2024 by [name], MD.  Called Nurse Triage reporting URI.  Symptoms began several weeks ago.  Interventions attempted: Rest, hydration, or home remedies.  Symptoms are: unchanged.  Triage Disposition: Discuss With PCP and Callback by Nurse Today  Patient/caregiver understands and will follow disposition?: Yes Answer Assessment - Initial Assessment Questions 1. REASON FOR CALL or QUESTION: What is your reason for calling today? or How can I best      Patient reports she was told to call if no improvement in congestion at visit on 9/30. States symptoms remain the same. Denies SOB or wheezing. Pharmacy is:  [facility] [place], [name] - [address] [[id number]]   Per OV note on 02/28/24: Congestive symptoms due to upper respiratory infection   Upper respiratory infection with congestion and phlegm, ongoing for over a week. Symptoms include facial congestion and cough, likely due to sinus drainage. No current indication for antibiotics unless symptoms persist beyond ten days or worsen.   - Advise use of neti pot for sinus rinsing   - Monitor symptoms and consider antibiotics if no improvement by the end of the week   Protocols used: PCP Call - No Triage-A-AH   Copied from CRM #[id number]. Topic: Clinical - Medication Question >> Mar 07, 2024 10:49 AM Travis F wrote: Reason for CRM: Patient's daughter is calling in because patient saw Dr. KATHEE on 02/28/24 for a hospital follow up. Patient was experiencing symptoms of an upper respiratory infection and was told by Dr.B it was more sinus related and to give it a few days and if it didn't get better to call the office. Patient's daughter is calling in requesting medication be sent in for patient.

## 2024-03-12 ENCOUNTER — Ambulatory Visit: Admitting: Family Medicine

## 2024-03-14 ENCOUNTER — Other Ambulatory Visit: Payer: Self-pay

## 2024-03-14 ENCOUNTER — Ambulatory Visit (INDEPENDENT_AMBULATORY_CARE_PROVIDER_SITE_OTHER)

## 2024-03-14 ENCOUNTER — Ambulatory Visit

## 2024-03-14 DIAGNOSIS — M800B2A Age-related osteoporosis with current pathological fracture, left pelvis, initial encounter for fracture: Secondary | ICD-10-CM | POA: Diagnosis not present

## 2024-03-14 DIAGNOSIS — R102 Pelvic and perineal pain unspecified side: Secondary | ICD-10-CM

## 2024-03-14 DIAGNOSIS — S32592D Other specified fracture of left pubis, subsequent encounter for fracture with routine healing: Secondary | ICD-10-CM | POA: Diagnosis not present

## 2024-03-14 DIAGNOSIS — S32502D Unspecified fracture of left pubis, subsequent encounter for fracture with routine healing: Secondary | ICD-10-CM | POA: Diagnosis not present

## 2024-03-14 NOTE — Progress Notes (Addendum)
 Orthopaedic Surgery New Patient Visit   History of Present Illness: The patient is a 83 y.o. female seen in clinic for left-sided pelvis fracture.  Patient reports injury occurred after falling out of her wheelchair on August 23.  She experienced immediate pain on the left side of her pelvis and was taken to [facility] for further evaluation.  Imaging obtained while in the hospital including x-ray and CT revealed multiple left-sided pelvic fractures.  She was evaluated by orthopedics and conservative management was recommended.  Unfortunately, patient developed a PE after discharge which required medical attention.  She was seen by her primary care physician for follow-up whom recommended she see orthopedics for follow-up of her pelvic fractures.  She reports pain in the left side of her pelvis has been improving since initial injury.  She has been utilizing lidocaine  patches which have been helping with her symptoms.  Home health also has been visiting but she states they have not been doing anything that she cannot do on her own.  Patient does have an orthopedic history significant for left lower extremity sarcoma in 2020 which was treated in [place] at [facility].  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  She has been confined to a wheelchair but is able to stand, hop on her right lower extremity and tolerate transfers.  She reports she had been doing well up until her recent fall at the end of August.  Patient does live alone and is independent.  She is accompanied by her friend today.     Past Medical, Social and Family History: Past Medical History:  Diagnosis Date   Allergy    Anemia    Arthritis    CA of skin 10/15/2014   Calculus of kidney 10/15/2014   Seen in ER 06/16/10.    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   Coronary artery disease     Diverticulitis    Environmental and seasonal allergies    Fibrocystic breast disease (FCBD)    GERD (gastroesophageal reflux disease)    Heart murmur    High cholesterol    History of heart artery stent    History of kidney stones    Hyperlipidemia    Hypertension    Hyperthyroidism    Myocardial infarction (HCC)    Osteomyelitis of left femur (HCC) 11/30/2018   Osteoporosis    PONV (postoperative nausea and vomiting)    Reactive depression 02/25/2019   Thyroid  disease    TIA (transient ischemic attack)    Past Surgical History:  Procedure Laterality Date   ABDOMINAL HYSTERECTOMY  2007   BREAST ENHANCEMENT SURGERY  2004   BREAST IMPLANT REMOVAL     BREAST SURGERY  1984   Fibrocystic Disease   CAROTID ENDARTERECTOMY Left 06/21/2014   Dr. Jama   CAROTID ENDARTERECTOMY Right 08/02/2014   Dr. Jama   CAROTID PTA/STENT INTERVENTION N/A 06/15/2017   Procedure: CAROTID PTA/STENT INTERVENTION;  Surgeon: Jama Cordella MATSU, MD;  Location: [facility] INVASIVE CV LAB;  Service: Cardiovascular;  Laterality: N/A;   COLONOSCOPY WITH PROPOFOL  N/A 07/14/2015   Procedure: COLONOSCOPY WITH PROPOFOL ;  Surgeon: Deward CINDERELLA Piedmont, MD;  Location: [facility] ENDOSCOPY;  Service: Gastroenterology;  Laterality: N/A;   COLONOSCOPY WITH PROPOFOL  N/A 02/08/2018   Procedure: COLONOSCOPY WITH PROPOFOL ;  Surgeon: Toledo, Ladell POUR, MD;  Location: [facility] ENDOSCOPY;  Service: Gastroenterology;  Laterality: N/A;   CORONARY  ANGIOPLASTY WITH STENT PLACEMENT  1999   CYSTOSCOPY WITH STENT PLACEMENT Right 09/20/2022   Procedure: CYSTOSCOPY WITH STENT PLACEMENT;  Surgeon: Penne Knee, MD;  Location: [facility] ORS;  Service: Urology;  Laterality: Right;   CYSTOSCOPY/URETEROSCOPY/HOLMIUM LASER/STENT PLACEMENT Right 10/07/2022   Procedure: CYSTOSCOPY/URETEROSCOPY/HOLMIUM LASER/STENT PLACEMENT;  Surgeon: Penne Knee, MD;  Location: [facility] ORS;  Service: Urology;  Laterality: Right;   ESOPHAGOGASTRODUODENOSCOPY (EGD) WITH PROPOFOL  N/A  02/08/2018   Procedure: ESOPHAGOGASTRODUODENOSCOPY (EGD) WITH PROPOFOL ;  Surgeon: Toledo, Ladell POUR, MD;  Location: [facility] ENDOSCOPY;  Service: Gastroenterology;  Laterality: N/A;   FEMORAL ARTERY STENT  08/2003   HERNIA REPAIR  2008   LEG AMPUTATION AT HIP Left 02/28/2019   MASTECTOMY     RENAL ARTERY STENT  08/2003   TRANSUMBILICAL AUGMENTATION MAMMAPLASTY     VASCULAR SURGERY     VISCERAL ARTERY INTERVENTION N/A 02/13/2020   Procedure: VISCERAL ARTERY INTERVENTION;  Surgeon: Marea Selinda RAMAN, MD;  Location: [facility] INVASIVE CV LAB;  Service: Cardiovascular;  Laterality: N/A;   Allergies  Allergen Reactions   Amoxicillin Anaphylaxis   Cephalosporins Swelling and Rash    Lip swelling and rash on CTX and Vancomycin .   Naproxen Hives, Shortness Of Breath, Nausea And Vomiting and Swelling   Penicillins Shortness Of Breath, Swelling and Rash    Tolerated ceftriaxone  08/2022   Shellfish Allergy Diarrhea, Hives, Nausea And Vomiting, Swelling, Nausea Only and Other (See Comments)    Other Reaction(s): GI Intolerance   Vancomycin  Rash, Swelling, Dermatitis and Other (See Comments)    Occurred while on CTX and Vancomycin . More likely CTX as she has anaphylaxis to PCN, but should monitor closely.  Other Reaction(s): Angioedema, Facial Edema (intolerance)    Occurred while on CTX and Vancomycin . More likely CTX as she has anaphylaxis to PCN, but should monitor closely.   Codeine Nausea And Vomiting and Nausea Only    Other Reaction(s): GI Intolerance   Levofloxacin Nausea And Vomiting    Other Reaction(s): GI Intolerance   Current Outpatient Medications on File Prior to Visit  Medication Sig Dispense Refill   acetaminophen  (TYLENOL ) 500 MG tablet Take 500-1,000 mg by mouth every 8 (eight) hours as needed.     amLODipine  (NORVASC ) 5 MG tablet Take 1 tablet (5 mg total) by mouth daily. 90 tablet 3   apixaban  (ELIQUIS ) 5 MG TABS tablet Take 1 tablet (5 mg total) by mouth 2 (two) times daily. 180 tablet 1    Ascorbic Acid  (VITAMIN C ) 1000 MG tablet Take 1 tablet (1,000 mg total) by mouth every morning. 90 tablet 1   Bacillus Coagulans-Inulin (PROBIOTIC-PREBIOTIC) 1-250 BILLION-MG CAPS Take 1 capsule by mouth daily. 90 capsule 01   Calcium  Carb-Cholecalciferol  (CALCIUM +D3) 600-20 MG-MCG TABS Take 1 tablet by mouth daily. 90 tablet 1   doxycycline  (VIBRA -TABS) 100 MG tablet Take 1 tablet (100 mg total) by mouth 2 (two) times daily. 14 tablet 0   ezetimibe  (ZETIA ) 10 MG tablet Take 1 tablet (10 mg total) by mouth daily. 90 tablet 1   famotidine  (PEPCID ) 20 MG tablet Take 1 tablet (20 mg total) by mouth 2 (two) times daily. 180 tablet 1   gabapentin  (NEURONTIN ) 300 MG capsule Take 1 capsule (300 mg total) by mouth 2 (two) times daily. 180 capsule 3   hydrALAZINE  (APRESOLINE ) 50 MG tablet Take 1 tablet (50 mg total) by mouth 3 (three) times daily. Take 1 tablet (50 mg total) by mouth 3 (three) times daily. 270 tablet 1   lidocaine  (LIDODERM ) 5 % Place  1 patch onto the skin daily. 30 patch 2   losartan  (COZAAR ) 100 MG tablet Take 1 tablet (100 mg total) by mouth daily. 90 tablet 1   Mesalamine  800 MG TBEC Take 1 tablet (800 mg total) by mouth in the morning, at noon, and at bedtime. 270 tablet 1   methimazole  (TAPAZOLE ) 5 MG tablet 1 Tablet on Mondays, Wednesdays and Fridays and take 1/2 tablet daily the rest of the week. 60 tablet 3   oxyCODONE  (ROXICODONE ) 5 MG immediate release tablet Take 1 tablet (5 mg total) by mouth every 6 (six) hours as needed for severe pain (pain score 7-10). 30 tablet 0   potassium chloride  SA (KLOR-CON  M) 20 MEQ tablet Take 1 tablet (20 mEq total) by mouth 2 (two) times daily. 180 tablet 1   rosuvastatin  (CRESTOR ) 10 MG tablet Take 1 tablet (10 mg total) by mouth daily. 90 tablet 1   No current facility-administered medications on file prior to visit.   Social History   Tobacco Use   Smoking status: Former    Current packs/day: 0.00    Types: Cigarettes    Quit date:  05/30/1994    Years since quitting: 29.8   Smokeless tobacco: Never  Vaping Use   Vaping status: Never Used  Substance Use Topics   Alcohol use: Not Currently    Comment: rare- 1 glass of wine EOW   Drug use: No      I have reviewed past medical, surgical, social and family history, medications and allergies as documented in the EMR.  Review of Systems - A ROS was performed including pertinent positives and negatives as documented in the HPI.     Physical Exam:  General/Constitutional: NAD Vascular: No edema, swelling or tenderness, except as noted in detailed exam Integumentary: No impressive skin lesions present, except as noted in detailed exam Neuro/Psych: Normal mood and affect, oriented to person, place and time Musculoskeletal: Normal, except as noted in detailed exam and in HPI   Focused examination:  Patient seen sitting in wheelchair at the time of examination.  On focused examination of the pelvis, there are no obvious deformities of the left sided pelvis.  Obvious left hip disarticulation with no lower extremity present on that side.  Skin is intact about the left sided pelvis.  Numerous lidocaine  patches in place.  No erythema or ecchymosis present.  Mild tenderness about the left sided sacral region.  Mild tenderness about the ASIS.  No tenderness to palpation about the left sided pubic symphysis.  Nontender elsewhere about the left hemipelvis.  Sensation intact to light touch over the left hemipelvis.  Skin is warm.    XR pelvis imaging: X-rays of the pelvis including AP, inlet and outlet views were obtained in office today and reviewed with patient.  Per my interpretation, there are no new fractures or dislocations.  Left femur absent from radiograph secondary to previous hip disarticulation.  Diffuse osteopenia noted within the pelvis.  Nondisplaced fracture about the inferior pubic symphysis on the left with callus formation.  Calcification also noted about the  left ASIS.  Degenerative changes present about bilateral SI joints.  Calcification of vessels.  Nondisplaced fracture about the inferor sacral ala on the left.  Right hip joint well-maintained with mild osteoarthritis.    Radiology Read:  XR PELVIS- [facility] 01/26/24 Nondisplaced left inferior pubic rami and left inferior sacral ala fracture. Prior seen superior pubic rami fracture and superior anterior iliac spine fracture not well visualized on  this exam.   No new fracture or dislocation.  Mild to moderate osteoarthritis of the right hip and both sacroiliac joints.   CT PELVIS WO CONTRAST- [facility] 01/22/24 1.  Undisplaced fracture of left sacral ala.  2.  Minimally displaced fracture of left pubic parasymphysis, left high superior and left inferior pubic ramus.  3.  Nondisplaced fracture of left anterior superior iliac spine  4.  Diffuse osteopenia noted which does limit evaluation of further nondisplaced fractures. Consider MRI for better evaluation.   Assessment:  Left hemipelvis fractures including superior and inferior pubic rami, ASIS, sacral ala sustained on 8/23 with initial follow-up encounter from outside hospital  Plan:  Patient was seen and examined in office today. We reviewed patient's history, examination, and imaging in detail. Based on information available for this encounter, patient presents following left sided hemipelvis fracture sustained from a fall out of her wheelchair on 8/23.  Her injury was complicated by a PE suffered in the weeks after her fall.  She is now back at home living independently.  She states functionally there is nothing she cannot perform from her baseline status.  Pain is well-controlled and improving.  Lidocaine  patches have been working per patient.  No new or worsening symptoms.  Radiographs reveal stable fracture pattern. Her PE complication is being managed as well. Discussed the natural course of her injury and treatment plan moving  forward. Offered formal physical therapy via home health, but patient does not feel it is necessary as she has no functional deficits.  Explained to patient that we typically monitor these injuries over time to ensure no new symptoms and appropriate healing on radiographs.  She politely declined formal follow-up.  She states she is doing well and feels that further follow-up would not provide any necessary benefit.  As a result, we offered to the patient that she may call our office at any time for future concerns or if new symptoms present.   All questions, concerns and comments were addressed to the best of my ability.  Follow-up: As needed   Arlyss GEANNIE Schneider, DO Orthopedic Surgery & Sports Medicine [place]   This document was dictated using Dragon voice recognition software. A reasonable attempt at proof reading has been made to minimize errors.

## 2024-03-14 NOTE — Patient Instructions (Signed)

## 2024-03-19 DIAGNOSIS — M792 Neuralgia and neuritis, unspecified: Secondary | ICD-10-CM | POA: Diagnosis not present

## 2024-03-19 DIAGNOSIS — I2609 Other pulmonary embolism with acute cor pulmonale: Secondary | ICD-10-CM | POA: Diagnosis not present

## 2024-03-19 DIAGNOSIS — Z8673 Personal history of transient ischemic attack (TIA), and cerebral infarction without residual deficits: Secondary | ICD-10-CM | POA: Diagnosis not present

## 2024-03-19 DIAGNOSIS — E785 Hyperlipidemia, unspecified: Secondary | ICD-10-CM | POA: Diagnosis not present

## 2024-03-19 DIAGNOSIS — I2510 Atherosclerotic heart disease of native coronary artery without angina pectoris: Secondary | ICD-10-CM | POA: Diagnosis not present

## 2024-03-19 DIAGNOSIS — S32592D Other specified fracture of left pubis, subsequent encounter for fracture with routine healing: Secondary | ICD-10-CM | POA: Diagnosis not present

## 2024-03-19 DIAGNOSIS — I10 Essential (primary) hypertension: Secondary | ICD-10-CM | POA: Diagnosis not present

## 2024-03-19 DIAGNOSIS — J9601 Acute respiratory failure with hypoxia: Secondary | ICD-10-CM | POA: Diagnosis not present

## 2024-03-19 DIAGNOSIS — K5190 Ulcerative colitis, unspecified, without complications: Secondary | ICD-10-CM | POA: Diagnosis not present

## 2024-03-19 DIAGNOSIS — I350 Nonrheumatic aortic (valve) stenosis: Secondary | ICD-10-CM | POA: Diagnosis not present

## 2024-03-19 DIAGNOSIS — E039 Hypothyroidism, unspecified: Secondary | ICD-10-CM | POA: Diagnosis not present

## 2024-03-19 DIAGNOSIS — J90 Pleural effusion, not elsewhere classified: Secondary | ICD-10-CM | POA: Diagnosis not present

## 2024-04-02 ENCOUNTER — Encounter: Payer: Self-pay | Admitting: Radiology

## 2024-05-10 ENCOUNTER — Ambulatory Visit: Admitting: Physician Assistant

## 2024-05-10 ENCOUNTER — Encounter: Payer: Self-pay | Admitting: Physician Assistant

## 2024-05-10 ENCOUNTER — Ambulatory Visit: Payer: Self-pay

## 2024-05-10 VITALS — BP 127/58 | HR 56 | Resp 24 | Ht 60.0 in | Wt 122.5 lb

## 2024-05-10 DIAGNOSIS — E7800 Pure hypercholesterolemia, unspecified: Secondary | ICD-10-CM

## 2024-05-10 DIAGNOSIS — Z86711 Personal history of pulmonary embolism: Secondary | ICD-10-CM | POA: Diagnosis not present

## 2024-05-10 DIAGNOSIS — Z8781 Personal history of (healed) traumatic fracture: Secondary | ICD-10-CM | POA: Diagnosis not present

## 2024-05-10 DIAGNOSIS — I2609 Other pulmonary embolism with acute cor pulmonale: Secondary | ICD-10-CM | POA: Diagnosis not present

## 2024-05-10 DIAGNOSIS — R0981 Nasal congestion: Secondary | ICD-10-CM | POA: Diagnosis not present

## 2024-05-10 DIAGNOSIS — R5383 Other fatigue: Secondary | ICD-10-CM | POA: Diagnosis not present

## 2024-05-10 DIAGNOSIS — Z89612 Acquired absence of left leg above knee: Secondary | ICD-10-CM | POA: Diagnosis not present

## 2024-05-10 DIAGNOSIS — I10 Essential (primary) hypertension: Secondary | ICD-10-CM | POA: Diagnosis not present

## 2024-05-10 DIAGNOSIS — R0602 Shortness of breath: Secondary | ICD-10-CM

## 2024-05-10 NOTE — Telephone Encounter (Signed)
 FYI Only or Action Required?: FYI only for provider: appointment scheduled on 05/10/24.  Patient was last seen in primary care on 02/28/2024 by [name], MD.  Called Nurse Triage reporting Shortness of Breath.  Symptoms began several days ago.  Interventions attempted: Nothing.  Symptoms are: unchanged.  Triage Disposition: See HCP Within 4 Hours (Or PCP Triage)  Patient/caregiver understands and will follow disposition?: Yes Reason for Disposition  [1] MILD difficulty breathing (e.g., minimal/no SOB at rest, SOB with walking, pulse < 100) AND [2] NEW-onset or WORSE than normal  Answer Assessment - Initial Assessment Questions Patient's daughter Carlyon calling in today with patient. Previous fall, broke pelvis and had pulmonary embolism. Taking apixaban  (ELIQUIS ) 5 MG TABS tablet 2x daily. Reports chest discomfort on Tuesday night. Denies leg swelling or pain. BP 174/55 HR 57. Patient has not taken medication this morning.   1. RESPIRATORY STATUS: Describe your breathing? (e.g., wheezing, shortness of breath, unable to speak, severe coughing)      More winded with exertion, mild SOB at rest  2. ONSET: When did this breathing problem begin?      Tuesday night not sleeping well, SOB similar to when had embolism but not the same.   3. PATTERN Does the difficult breathing come and go, or has it been constant since it started?      Constant since Tuesday  4. SEVERITY: How bad is your breathing? (e.g., mild, moderate, severe)      Moderate  5. CARDIAC HISTORY: Do you have any history of heart disease? (e.g., heart attack, angina, bypass surgery, angioplasty)      High cholesterol, high blood pressure  6. LUNG HISTORY: Do you have any history of lung disease?  (e.g., pulmonary embolus, asthma, emphysema)     Denies  7. CAUSE: What do you think is causing the breathing problem?      Unsure  8. OTHER SYMPTOMS: Do you have any other symptoms? (e.g., chest  pain, cough, dizziness, fever, runny nose)     Wheezing when active  9. O2 SATURATION MONITOR:  Do you use an oxygen  saturation monitor (pulse oximeter) at home? If Yes, ask: What is your reading (oxygen  level) today? What is your usual oxygen  saturation reading? (e.g., 95%)       Does not have pulse oximeter  Protocols used: Breathing Difficulty-A-AH  Copied from CRM #[id number]. Topic: Clinical - Red Word Triage >> May 10, 2024  8:38 AM Joesph NOVAK wrote: Red Word that prompted transfer to Nurse Triage: SOB last few days, patients daughter carlyon is calling.

## 2024-05-10 NOTE — Progress Notes (Signed)
 Established patient visit  Patient: Misty Page   DOB: [birth year]   83 y.o. Female  MRN: [id number] Visit Date: 05/10/2024  Today's healthcare provider: Jolynn Spencer, [name]   Chief Complaint  Patient presents with   Acute Visit    SOB, post nasal drip, fatigue ongoing since Tuesday otc: flonase  has not used it.    Subjective      History of Present Illness   SOB, post nasal drip, fatigue ongoing since Tuesday otc: flonase  has not used it.  SOB: mild difficulty breathing, worse than normal, especially with exertion, mild at rest, constant, since Tuesday Per daughter Carlyon, broke pelvis after a fall and had PE. Currently taking apixaban  5 mg twice daily. Reports having chest discomfort on Tuesday night.  Denies leg swelling or pain. Pt's BP was 174/55 with HR of 57. SOB similar to when she had embolism Having Wheezing when active     12/28/2023    8:19 AM 03/07/2023   11:49 AM 12/27/2022    8:39 AM  Depression screen PHQ 2/9  Decreased Interest 0 0 1  Down, Depressed, Hopeless 0 0 1  PHQ - 2 Score 0 0 2  Altered sleeping 0  1  Tired, decreased energy 0  1  Change in appetite 0  0  Feeling bad or failure about yourself  0  0  Trouble concentrating 0  0  Moving slowly or fidgety/restless 0  0  Suicidal thoughts 0  0  PHQ-9 Score 0   4   Difficult doing work/chores Not difficult at all  Not difficult at all     Data saved with a previous flowsheet row definition      08/09/2016    8:24 AM  GAD 7 : Generalized Anxiety Score  Nervous, Anxious, on Edge 3  Control/stop worrying 3  Worry too much - different things 3  Trouble relaxing 3  Restless 0  Easily annoyed or irritable 2  Afraid - awful might happen 3  Total GAD 7 Score 17  Anxiety Difficulty Not difficult at all    Medications: Show/hide medication list[1]  Review of Systems All negative Except see HPI       Objective    BP (!) 127/58   Pulse (!) 56   Resp (!) 24   Ht 5' (1.524 m)   Wt 122 lb 8  oz (55.6 kg)   SpO2 93%   BMI 23.92 kg/m     Physical Exam Vitals reviewed.  Constitutional:      Appearance: She is normal weight.  HENT:     Head: Normocephalic and atraumatic.     Right Ear: Ear canal and external ear normal.     Left Ear: Ear canal and external ear normal.     Nose: Congestion and rhinorrhea present.     Mouth/Throat:     Pharynx: Posterior oropharyngeal erythema present.     Comments: Postnasal drainage noted Eyes:     General: No scleral icterus.       Right eye: No discharge.        Left eye: No discharge.     Extraocular Movements: Extraocular movements intact.     Pupils: Pupils are equal, round, and reactive to light.  Cardiovascular:     Rate and Rhythm: Normal rate and regular rhythm.  Pulmonary:     Effort: Pulmonary effort is normal.     Breath sounds: Normal breath sounds.  Abdominal:     General: Abdomen is flat.  Bowel sounds are normal.     Palpations: Abdomen is soft.  Lymphadenopathy:     Cervical: No cervical adenopathy.  Neurological:     Mental Status: She is alert.    In wheelchair  No results found for any visits on 05/10/24.      Assessment & Plan   Shortness of breath (Primary) Other fatigue Worsening Denies leg swelling, chest pain after a short episode on Tuesday. 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 Hydralazine  50, amlodipine  5 Continue lifestyle modification Will follow-up  History of pulmonary embolus (PE) Provoked, on anticoagulation Should be on anticoagulation for 6 month and be re-evaluated /CT scan in 07/2024 to assess for clot resolution. Was diagnosed on 02/09/24, secondary to pelvic fractures and immobilization.  History of pelvic fracture 01/21/24 Follow-up orthopedic surgery   No orders of the defined types were placed in this encounter.   No follow-ups on file.   The patient was advised to call back or seek an in-person evaluation if the symptoms worsen or if the condition fails to improve as anticipated.  I discussed the assessment and treatment plan with the patient. The patient was provided an opportunity to ask questions and all were answered. The patient agreed with the plan and demonstrated an understanding of the instructions.  I, Vernal Hritz, [name] have reviewed all documentation for this visit. The documentation on 05/10/2024  for the exam, diagnosis, procedures, and orders are all accurate and complete.  Jolynn Spencer, [facility], MMS [facility] [phone] (phone) [phone] (fax)  [facility]     [1]  Outpatient Medications Prior to Visit  Medication Sig   acetaminophen  (TYLENOL ) 500 MG tablet Take 500-1,000 mg by mouth every 8 (eight) hours as needed.   amLODipine  (NORVASC ) 5 MG tablet Take 1 tablet (5 mg total) by mouth daily.   apixaban  (ELIQUIS ) 5 MG TABS tablet Take 1 tablet (5 mg total) by mouth 2 (two) times daily.   Ascorbic Acid  (VITAMIN C ) 1000 MG tablet Take 1 tablet (1,000 mg total) by mouth every morning.   Bacillus Coagulans-Inulin (PROBIOTIC-PREBIOTIC) 1-250 BILLION-MG CAPS Take 1 capsule by mouth daily.   Calcium  Carb-Cholecalciferol  (CALCIUM +D3) 600-20 MG-MCG TABS Take 1 tablet by mouth daily.   doxycycline  (VIBRA -TABS)  100 MG tablet Take 1 tablet (100 mg total) by mouth 2 (two) times daily.   ezetimibe  (ZETIA ) 10 MG tablet Take 1 tablet (10 mg total) by mouth daily.   famotidine  (PEPCID ) 20 MG tablet Take 1 tablet (20 mg total) by mouth 2 (two) times daily.   gabapentin  (NEURONTIN ) 300 MG capsule Take 1 capsule (300 mg total) by mouth 2 (two) times daily.   hydrALAZINE  (APRESOLINE ) 50 MG tablet Take 1 tablet (50 mg total) by mouth 3 (three) times daily. Take 1 tablet (50 mg total) by mouth 3 (three) times daily.   lidocaine  (LIDODERM ) 5 % Place 1 patch onto the  skin daily.   losartan  (COZAAR ) 100 MG tablet Take 1 tablet (100 mg total) by mouth daily.   Mesalamine  800 MG TBEC Take 1 tablet (800 mg total) by mouth in the morning, at noon, and at bedtime.   methimazole  (TAPAZOLE ) 5 MG tablet 1 Tablet on Mondays, Wednesdays and Fridays and take 1/2 tablet daily the rest of the week.   oxyCODONE  (ROXICODONE ) 5 MG immediate release tablet Take 1 tablet (5 mg total) by mouth every 6 (six) hours as needed for severe pain (pain score 7-10).   potassium chloride  SA (KLOR-CON  M) 20 MEQ tablet Take 1 tablet (20 mEq total) by mouth 2 (two) times daily.   rosuvastatin  (CRESTOR ) 10 MG tablet Take 1 tablet (10 mg total) by mouth daily.   No facility-administered medications prior to visit.

## 2024-05-11 LAB — CBC WITH DIFFERENTIAL/PLATELET
Basophils Absolute: 0 x10E3/uL (ref 0.0–0.2)
Basos: 0 %
EOS (ABSOLUTE): 0.2 x10E3/uL (ref 0.0–0.4)
Eos: 3 %
Hematocrit: 29.6 % — ABNORMAL LOW (ref 34.0–46.6)
Hemoglobin: 8.8 g/dL — ABNORMAL LOW (ref 11.1–15.9)
Immature Grans (Abs): 0 x10E3/uL (ref 0.0–0.1)
Immature Granulocytes: 0 %
Lymphocytes Absolute: 0.5 x10E3/uL — ABNORMAL LOW (ref 0.7–3.1)
Lymphs: 6 %
MCH: 22.3 pg — ABNORMAL LOW (ref 26.6–33.0)
MCHC: 29.7 g/dL — ABNORMAL LOW (ref 31.5–35.7)
MCV: 75 fL — ABNORMAL LOW (ref 79–97)
Monocytes Absolute: 0.7 x10E3/uL (ref 0.1–0.9)
Monocytes: 7 %
Neutrophils Absolute: 7.7 x10E3/uL — ABNORMAL HIGH (ref 1.4–7.0)
Neutrophils: 84 %
Platelets: 232 x10E3/uL (ref 150–450)
RBC: 3.94 x10E6/uL (ref 3.77–5.28)
RDW: 18.9 % — ABNORMAL HIGH (ref 11.7–15.4)
WBC: 9.1 x10E3/uL (ref 3.4–10.8)

## 2024-05-11 LAB — COMPREHENSIVE METABOLIC PANEL WITH GFR
ALT: 10 IU/L (ref 0–32)
AST: 12 IU/L (ref 0–40)
Albumin: 3.7 g/dL (ref 3.7–4.7)
Alkaline Phosphatase: 75 IU/L (ref 48–129)
BUN/Creatinine Ratio: 29 — ABNORMAL HIGH (ref 12–28)
BUN: 21 mg/dL (ref 8–27)
Bilirubin Total: 0.5 mg/dL (ref 0.0–1.2)
CO2: 19 mmol/L — ABNORMAL LOW (ref 20–29)
Calcium: 9.1 mg/dL (ref 8.7–10.3)
Chloride: 101 mmol/L (ref 96–106)
Creatinine, Ser: 0.72 mg/dL (ref 0.57–1.00)
Globulin, Total: 2.1 g/dL (ref 1.5–4.5)
Glucose: 103 mg/dL — ABNORMAL HIGH (ref 70–99)
Potassium: 4.2 mmol/L (ref 3.5–5.2)
Sodium: 137 mmol/L (ref 134–144)
Total Protein: 5.8 g/dL — ABNORMAL LOW (ref 6.0–8.5)
eGFR: 83 mL/min/1.73 (ref >59)

## 2024-05-11 LAB — TSH: TSH: 1.37 u[IU]/mL (ref 0.450–4.500)

## 2024-05-11 LAB — D-DIMER, QUANTITATIVE: D-DIMER: 0.38 mg{FEU}/L (ref 0.00–0.49)

## 2024-05-13 ENCOUNTER — Ambulatory Visit: Payer: Self-pay | Admitting: Physician Assistant

## 2024-05-14 ENCOUNTER — Inpatient Hospital Stay
Admission: EM | Admit: 2024-05-14 | Discharge: 2024-05-16 | DRG: 291 | Disposition: A | Attending: Internal Medicine | Admitting: Internal Medicine

## 2024-05-14 ENCOUNTER — Emergency Department

## 2024-05-14 ENCOUNTER — Encounter: Payer: Self-pay | Admitting: Intensive Care

## 2024-05-14 ENCOUNTER — Ambulatory Visit: Payer: Self-pay

## 2024-05-14 ENCOUNTER — Other Ambulatory Visit: Payer: Self-pay

## 2024-05-14 DIAGNOSIS — Z83438 Family history of other disorder of lipoprotein metabolism and other lipidemia: Secondary | ICD-10-CM

## 2024-05-14 DIAGNOSIS — I2489 Other forms of acute ischemic heart disease: Secondary | ICD-10-CM | POA: Diagnosis not present

## 2024-05-14 DIAGNOSIS — I5033 Acute on chronic diastolic (congestive) heart failure: Secondary | ICD-10-CM | POA: Diagnosis not present

## 2024-05-14 DIAGNOSIS — Z825 Family history of asthma and other chronic lower respiratory diseases: Secondary | ICD-10-CM

## 2024-05-14 DIAGNOSIS — M810 Age-related osteoporosis without current pathological fracture: Secondary | ICD-10-CM | POA: Diagnosis not present

## 2024-05-14 DIAGNOSIS — Z8262 Family history of osteoporosis: Secondary | ICD-10-CM | POA: Diagnosis not present

## 2024-05-14 DIAGNOSIS — Z7901 Long term (current) use of anticoagulants: Secondary | ICD-10-CM

## 2024-05-14 DIAGNOSIS — Z803 Family history of malignant neoplasm of breast: Secondary | ICD-10-CM

## 2024-05-14 DIAGNOSIS — Z66 Do not resuscitate: Secondary | ICD-10-CM | POA: Diagnosis not present

## 2024-05-14 DIAGNOSIS — I2699 Other pulmonary embolism without acute cor pulmonale: Secondary | ICD-10-CM | POA: Diagnosis present

## 2024-05-14 DIAGNOSIS — I2510 Atherosclerotic heart disease of native coronary artery without angina pectoris: Secondary | ICD-10-CM | POA: Diagnosis present

## 2024-05-14 DIAGNOSIS — D509 Iron deficiency anemia, unspecified: Secondary | ICD-10-CM | POA: Diagnosis present

## 2024-05-14 DIAGNOSIS — Z86711 Personal history of pulmonary embolism: Secondary | ICD-10-CM | POA: Diagnosis not present

## 2024-05-14 DIAGNOSIS — E7800 Pure hypercholesterolemia, unspecified: Secondary | ICD-10-CM | POA: Diagnosis present

## 2024-05-14 DIAGNOSIS — Z8249 Family history of ischemic heart disease and other diseases of the circulatory system: Secondary | ICD-10-CM

## 2024-05-14 DIAGNOSIS — Z823 Family history of stroke: Secondary | ICD-10-CM

## 2024-05-14 DIAGNOSIS — I350 Nonrheumatic aortic (valve) stenosis: Secondary | ICD-10-CM | POA: Diagnosis not present

## 2024-05-14 DIAGNOSIS — I10 Essential (primary) hypertension: Secondary | ICD-10-CM | POA: Diagnosis present

## 2024-05-14 DIAGNOSIS — K219 Gastro-esophageal reflux disease without esophagitis: Secondary | ICD-10-CM | POA: Diagnosis not present

## 2024-05-14 DIAGNOSIS — E0590 Thyrotoxicosis, unspecified without thyrotoxic crisis or storm: Secondary | ICD-10-CM | POA: Diagnosis present

## 2024-05-14 DIAGNOSIS — K5190 Ulcerative colitis, unspecified, without complications: Secondary | ICD-10-CM | POA: Diagnosis present

## 2024-05-14 DIAGNOSIS — Z89612 Acquired absence of left leg above knee: Secondary | ICD-10-CM

## 2024-05-14 DIAGNOSIS — Z79899 Other long term (current) drug therapy: Secondary | ICD-10-CM

## 2024-05-14 DIAGNOSIS — I110 Hypertensive heart disease with heart failure: Principal | ICD-10-CM | POA: Diagnosis present

## 2024-05-14 DIAGNOSIS — Z806 Family history of leukemia: Secondary | ICD-10-CM

## 2024-05-14 DIAGNOSIS — R0902 Hypoxemia: Secondary | ICD-10-CM | POA: Diagnosis present

## 2024-05-14 DIAGNOSIS — J90 Pleural effusion, not elsewhere classified: Secondary | ICD-10-CM | POA: Diagnosis not present

## 2024-05-14 DIAGNOSIS — Z90710 Acquired absence of both cervix and uterus: Secondary | ICD-10-CM

## 2024-05-14 DIAGNOSIS — Z87891 Personal history of nicotine dependence: Secondary | ICD-10-CM

## 2024-05-14 DIAGNOSIS — R0602 Shortness of breath: Secondary | ICD-10-CM | POA: Diagnosis not present

## 2024-05-14 DIAGNOSIS — J9811 Atelectasis: Secondary | ICD-10-CM | POA: Diagnosis not present

## 2024-05-14 DIAGNOSIS — I5A Non-ischemic myocardial injury (non-traumatic): Secondary | ICD-10-CM | POA: Diagnosis present

## 2024-05-14 DIAGNOSIS — Z8673 Personal history of transient ischemic attack (TIA), and cerebral infarction without residual deficits: Secondary | ICD-10-CM

## 2024-05-14 DIAGNOSIS — R0989 Other specified symptoms and signs involving the circulatory and respiratory systems: Secondary | ICD-10-CM | POA: Diagnosis not present

## 2024-05-14 DIAGNOSIS — Z8261 Family history of arthritis: Secondary | ICD-10-CM

## 2024-05-14 DIAGNOSIS — R079 Chest pain, unspecified: Secondary | ICD-10-CM | POA: Diagnosis not present

## 2024-05-14 DIAGNOSIS — J811 Chronic pulmonary edema: Secondary | ICD-10-CM | POA: Diagnosis not present

## 2024-05-14 DIAGNOSIS — M7989 Other specified soft tissue disorders: Secondary | ICD-10-CM | POA: Diagnosis not present

## 2024-05-14 DIAGNOSIS — Z955 Presence of coronary angioplasty implant and graft: Secondary | ICD-10-CM

## 2024-05-14 DIAGNOSIS — I252 Old myocardial infarction: Secondary | ICD-10-CM

## 2024-05-14 DIAGNOSIS — Z800 Family history of malignant neoplasm of digestive organs: Secondary | ICD-10-CM

## 2024-05-14 DIAGNOSIS — Z8419 Family history of other disorders of kidney and ureter: Secondary | ICD-10-CM

## 2024-05-14 DIAGNOSIS — Z820 Family history of epilepsy and other diseases of the nervous system: Secondary | ICD-10-CM

## 2024-05-14 DIAGNOSIS — I509 Heart failure, unspecified: Secondary | ICD-10-CM | POA: Diagnosis not present

## 2024-05-14 LAB — PRO BRAIN NATRIURETIC PEPTIDE: Pro Brain Natriuretic Peptide: 2472 pg/mL — ABNORMAL HIGH (ref <300.0)

## 2024-05-14 LAB — CBC
HCT: 29.8 % — ABNORMAL LOW (ref 36.0–46.0)
Hemoglobin: 8.8 g/dL — ABNORMAL LOW (ref 12.0–15.0)
MCH: 21.3 pg — ABNORMAL LOW (ref 26.0–34.0)
MCHC: 29.5 g/dL — ABNORMAL LOW (ref 30.0–36.0)
MCV: 72.2 fL — ABNORMAL LOW (ref 80.0–100.0)
Platelets: 383 K/uL (ref 150–400)
RBC: 4.13 MIL/uL (ref 3.87–5.11)
RDW: 21.1 % — ABNORMAL HIGH (ref 11.5–15.5)
WBC: 7.6 K/uL (ref 4.0–10.5)
nRBC: 0 % (ref 0.0–0.2)

## 2024-05-14 LAB — HEPATIC FUNCTION PANEL
ALT: 10 U/L (ref 0–44)
AST: 24 U/L (ref 15–41)
Albumin: 3.6 g/dL (ref 3.5–5.0)
Alkaline Phosphatase: 73 U/L (ref 38–126)
Bilirubin, Direct: <0.1 mg/dL (ref 0.0–0.2)
Total Bilirubin: 0.3 mg/dL (ref 0.0–1.2)
Total Protein: 6.4 g/dL — ABNORMAL LOW (ref 6.5–8.1)

## 2024-05-14 LAB — BASIC METABOLIC PANEL WITH GFR
Anion gap: 13 (ref 5–15)
BUN: 19 mg/dL (ref 8–23)
CO2: 23 mmol/L (ref 22–32)
Calcium: 9.4 mg/dL (ref 8.9–10.3)
Chloride: 100 mmol/L (ref 98–111)
Creatinine, Ser: 0.7 mg/dL (ref 0.44–1.00)
GFR, Estimated: >60 mL/min (ref >60)
Glucose, Bld: 131 mg/dL — ABNORMAL HIGH (ref 70–99)
Potassium: 4.3 mmol/L (ref 3.5–5.1)
Sodium: 136 mmol/L (ref 135–145)

## 2024-05-14 LAB — BLOOD GAS, VENOUS
Acid-Base Excess: 1.3 mmol/L (ref 0.0–2.0)
Bicarbonate: 25.9 mmol/L (ref 20.0–28.0)
O2 Saturation: 89 %
Patient temperature: 37
pCO2, Ven: 40 mmHg — ABNORMAL LOW (ref 44–60)
pH, Ven: 7.42 (ref 7.25–7.43)
pO2, Ven: 57 mmHg — ABNORMAL HIGH (ref 32–45)

## 2024-05-14 LAB — TROPONIN T, HIGH SENSITIVITY
Troponin T High Sensitivity: 27 ng/L — ABNORMAL HIGH (ref 0–19)
Troponin T High Sensitivity: 26 ng/L — ABNORMAL HIGH (ref 0–19)

## 2024-05-14 MED ORDER — DM-GUAIFENESIN ER 30-600 MG PO TB12: 1.0000 | ORAL_TABLET | Freq: Two times a day (BID) | ORAL | Status: DC | PRN

## 2024-05-14 MED ORDER — FUROSEMIDE 10 MG/ML IJ SOLN
20.0000 mg | Freq: Once | INTRAMUSCULAR | Status: AC
  Administered 2024-05-14: 19:00:00 20 mg via INTRAVENOUS
  Filled 2024-05-14: qty 4

## 2024-05-14 MED ORDER — HYDRALAZINE HCL 20 MG/ML IJ SOLN: 5.0000 mg | INTRAMUSCULAR | Status: DC | PRN

## 2024-05-14 MED ORDER — IOHEXOL 350 MG/ML SOLN
75.0000 mL | Freq: Once | INTRAVENOUS | Status: AC | PRN
  Administered 2024-05-14: 21:00:00 75 mL via INTRAVENOUS

## 2024-05-14 MED ORDER — RISAQUAD PO CAPS
1.0000 | ORAL_CAPSULE | Freq: Every day | ORAL | Status: DC
  Administered 2024-05-15 – 2024-05-16 (×2): 1 via ORAL
  Filled 2024-05-14 (×2): qty 1

## 2024-05-14 MED ORDER — HYDRALAZINE HCL 50 MG PO TABS
50.0000 mg | ORAL_TABLET | Freq: Three times a day (TID) | ORAL | Status: DC
  Administered 2024-05-14 – 2024-05-15 (×3): 50 mg via ORAL
  Filled 2024-05-14 (×5): qty 1

## 2024-05-14 MED ORDER — ACETAMINOPHEN 325 MG PO TABS: 650.0000 mg | ORAL_TABLET | Freq: Four times a day (QID) | ORAL | Status: DC | PRN

## 2024-05-14 MED ORDER — EZETIMIBE 10 MG PO TABS
10.0000 mg | ORAL_TABLET | Freq: Every day | ORAL | Status: DC
  Administered 2024-05-15 – 2024-05-16 (×2): 10 mg via ORAL
  Filled 2024-05-14 (×2): qty 1

## 2024-05-14 MED ORDER — ONDANSETRON HCL 4 MG/2ML IJ SOLN: 4.0000 mg | Freq: Three times a day (TID) | INTRAMUSCULAR | Status: DC | PRN

## 2024-05-14 MED ORDER — LOSARTAN POTASSIUM 50 MG PO TABS
100.0000 mg | ORAL_TABLET | Freq: Every day | ORAL | Status: DC
  Filled 2024-05-14 (×2): qty 2

## 2024-05-14 MED ORDER — APIXABAN 5 MG PO TABS
5.0000 mg | ORAL_TABLET | Freq: Two times a day (BID) | ORAL | Status: DC
  Administered 2024-05-14 – 2024-05-16 (×4): 5 mg via ORAL
  Filled 2024-05-14 (×4): qty 1

## 2024-05-14 MED ORDER — AMLODIPINE BESYLATE 5 MG PO TABS
5.0000 mg | ORAL_TABLET | Freq: Every day | ORAL | Status: DC
  Administered 2024-05-15: 09:00:00 5 mg via ORAL
  Filled 2024-05-14 (×2): qty 1

## 2024-05-14 MED ORDER — VITAMIN C 500 MG PO TABS
1000.0000 mg | ORAL_TABLET | Freq: Every morning | ORAL | Status: DC
  Administered 2024-05-15 – 2024-05-16 (×2): 1000 mg via ORAL
  Filled 2024-05-14 (×2): qty 2

## 2024-05-14 MED ORDER — FAMOTIDINE 20 MG PO TABS
20.0000 mg | ORAL_TABLET | Freq: Two times a day (BID) | ORAL | Status: DC
  Administered 2024-05-14 – 2024-05-16 (×4): 20 mg via ORAL
  Filled 2024-05-14 (×4): qty 1

## 2024-05-14 MED ORDER — METHIMAZOLE 2.5 MG HALF TABLET
2.5000 mg | ORAL_TABLET | ORAL | Status: DC
  Administered 2024-05-15: 09:00:00 2.5 mg via ORAL
  Filled 2024-05-14: qty 1

## 2024-05-14 MED ORDER — ROSUVASTATIN CALCIUM 20 MG PO TABS
10.0000 mg | ORAL_TABLET | Freq: Every day | ORAL | Status: DC
  Administered 2024-05-15 – 2024-05-16 (×2): 10 mg via ORAL
  Filled 2024-05-14 (×2): qty 1

## 2024-05-14 MED ORDER — ALBUTEROL SULFATE (2.5 MG/3ML) 0.083% IN NEBU: 2.5000 mg | INHALATION_SOLUTION | RESPIRATORY_TRACT | Status: DC | PRN

## 2024-05-14 MED ORDER — MESALAMINE 800 MG PO TBEC: 800.0000 mg | DELAYED_RELEASE_TABLET | Freq: Three times a day (TID) | ORAL | Status: DC

## 2024-05-14 MED ORDER — GABAPENTIN 300 MG PO CAPS
300.0000 mg | ORAL_CAPSULE | Freq: Two times a day (BID) | ORAL | Status: DC
  Administered 2024-05-14 – 2024-05-16 (×4): 300 mg via ORAL
  Filled 2024-05-14 (×4): qty 1

## 2024-05-14 NOTE — Telephone Encounter (Signed)
 Agree with ED triage if experiencing difficulty breathing. Will also CC provider that saw her last week and may have more insight into her current state.

## 2024-05-14 NOTE — ED Notes (Signed)
 Pt to CT at this time.

## 2024-05-14 NOTE — H&P (Signed)
 History and Physical    Misty Page FMW:[id number] DOB: 1940-12-18 DOA: 05/14/2024  Referring MD/NP/PA:   PCP: [name], MD   Patient coming from:  The patient is coming from home.     Chief Complaint: SOB, leg edema  HPI: Misty Page is a 83 y.o. 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  Patient states that she has SOB for more than 1 week, which has been progressively worsening.  She has dry cough, no chest pain, fever or chills.  She has orthopnea.  No nausea, vomiting, diarrhea or abdominal pain.  No symptoms of UTI.  Data reviewed independently and ED Course: pt was found to have BNP 2472, WBC 7.6, GFR> 60, troponin 27- > 26.  Temperature normal, blood pressure 176/56, heart rate 50-60s, RR 29, oxygen  saturation 94-96% on 2 L oxygen  (patient is not using oxygen  at baseline).  Chest x-ray showed cardiomegaly and pulmonary edema. Right leg lower extremity venous Doppler negative for DVT.  CTA negative for PE. Pt is admitted to telemetry bed as inpatient.   CTA: 1. No pulmonary embolism. 2. Bilateral pleural effusions, right slightly greater than left, with associated bibasilar atelectasis. 3. Increased patchy left upper lobe airspace disease compared to prior, compatible with pneumonia or aspiration.   EKG: I have personally reviewed.  Sinus rhythm, QTc 422, and stable baseline, a lot of artificial effects.   Review of Systems:   General: no fevers, chills, no body weight gain, has fatigue HEENT: no blurry vision, hearing changes or sore throat Respiratory: has dyspnea, coughing,  no wheezing CV: no chest pain, no palpitations GI: no nausea, vomiting, abdominal pain, diarrhea, constipation GU: no dysuria, burning on urination, increased  urinary frequency, hematuria  Ext: has leg edema Neuro: no unilateral weakness, numbness, or tingling, no vision change or hearing loss Skin: no rash, no skin tear. MSK: No muscle spasm, no deformity, no limitation of range of movement in spin Heme: No easy bruising.  Travel history: No recent long distant travel.   Allergy: Allergies[1]  Past Medical History:  Diagnosis Date   Allergy    Anemia    Arthritis    CA of skin 10/15/2014   Calculus of kidney 10/15/2014   Seen in ER 06/16/10.    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   Coronary artery disease    Diverticulitis    Environmental and seasonal allergies    Fibrocystic breast disease (FCBD)    GERD (gastroesophageal reflux disease)    Heart murmur    High cholesterol    History of heart artery stent    History of kidney stones    Hyperlipidemia    Hypertension    Hyperthyroidism    Myocardial infarction (HCC)    Osteomyelitis of left femur (HCC) 11/30/2018   Osteoporosis    PONV (postoperative nausea and vomiting)    Reactive depression 02/25/2019   Thyroid  disease    TIA (transient ischemic attack)     Past Surgical History:  Procedure Laterality Date   ABDOMINAL HYSTERECTOMY  2007   BREAST ENHANCEMENT SURGERY  2004   BREAST IMPLANT REMOVAL     BREAST SURGERY  1984   Fibrocystic Disease   CAROTID ENDARTERECTOMY Left 06/21/2014   Dr. Jama  CAROTID ENDARTERECTOMY Right 08/02/2014   Dr. Jama   CAROTID PTA/STENT INTERVENTION N/A 06/15/2017   Procedure: CAROTID PTA/STENT INTERVENTION;  Surgeon: Jama Cordella MATSU, MD;  Location: [facility];  Service: Cardiovascular;  Laterality: N/A;   COLONOSCOPY WITH PROPOFOL  N/A 07/14/2015   Procedure: COLONOSCOPY WITH PROPOFOL ;  Surgeon: Deward CINDERELLA Piedmont, MD;  Location: [facility] ENDOSCOPY;  Service: Gastroenterology;  Laterality: N/A;   COLONOSCOPY WITH PROPOFOL  N/A 02/08/2018   Procedure: COLONOSCOPY  WITH PROPOFOL ;  Surgeon: Toledo, Ladell POUR, MD;  Location: [facility] ENDOSCOPY;  Service: Gastroenterology;  Laterality: N/A;   CORONARY ANGIOPLASTY WITH STENT PLACEMENT  1999   CYSTOSCOPY WITH STENT PLACEMENT Right 09/20/2022   Procedure: CYSTOSCOPY WITH STENT PLACEMENT;  Surgeon: Penne Knee, MD;  Location: [facility] ORS;  Service: Urology;  Laterality: Right;   CYSTOSCOPY/URETEROSCOPY/HOLMIUM LASER/STENT PLACEMENT Right 10/07/2022   Procedure: CYSTOSCOPY/URETEROSCOPY/HOLMIUM LASER/STENT PLACEMENT;  Surgeon: Penne Knee, MD;  Location: [facility] OR[facility];  Service: Urology;  Laterality: Right;   ESOPHAGOGASTRODUODENOSCOPY (EGD) WITH PROPOFOL  N/A 02/08/2018   Procedure: ESOPHAGOGASTRODUODENOSCOPY (EGD) WITH PROPOFOL ;  Surgeon: Toledo, Ladell POUR, MD;  Location: [facility] ENDOSCOPY;  Service: Gastroenterology;  Laterality: N/A;   FEMORAL ARTERY STENT  08/2003   HERNIA REPAIR  2008   LEG AMPUTATION AT HIP Left 02/28/2019   MASTECTOMY     RENAL ARTERY STENT  08/2003   TRANSUMBILICAL AUGMENTATION MAMMAPLASTY     VASCULAR SURGERY     VISCERAL ARTERY INTERVENTION N/A 02/13/2020   Procedure: VISCERAL ARTERY INTERVENTION;  Surgeon: Marea Selinda RAMAN, MD;  Location: [facility];  Service: Cardiovascular;  Laterality: N/A;    Social History:  reports that she quit smoking about 29 years ago. Her smoking use included cigarettes. She has never used smokeless tobacco. She reports current alcohol use. She reports that she does not use drugs.  Family History:  Family History  Problem Relation Age of Onset   Hyperlipidemia Mother    Congestive Heart Failure Mother    COPD Mother    Heart disease Mother    Hypertension Mother    Osteoporosis Mother    Pancreatic cancer Father    Arthritis Sister    Alzheimer's disease Sister    Hypertension Sister    Prostate cancer Brother    Heart attack Brother    Stomach cancer Brother    Kidney failure Brother    Leukemia Brother    Emphysema Brother    Pancreatic cancer  Brother    Stomach cancer Brother    Breast cancer Paternal Grandmother    Leukemia Paternal Grandfather    Heart attack Maternal Uncle    Stroke Maternal Aunt      Prior to Admission medications  Medication Sig Start Date End Date Taking? Authorizing Provider  acetaminophen  (TYLENOL ) 500 MG tablet Take 500-1,000 mg by mouth every 8 (eight) hours as needed.    [provider]  amLODipine  (NORVASC ) 5 MG tablet Take 1 tablet (5 mg total) by mouth daily. 02/28/24   [name], MD  apixaban  (ELIQUIS ) 5 MG TABS tablet Take 1 tablet (5 mg total) by mouth 2 (two) times daily. 02/28/24   [name], Angela M, MD  Ascorbic Acid  (VITAMIN C ) 1000 MG tablet Take 1 tablet (1,000 mg total) by mouth every morning. 02/28/24   [name], Angela M, MD  Bacillus Coagulans-Inulin (PROBIOTIC-PREBIOTIC) 1-250 BILLION-MG CAPS Take 1 capsule by mouth daily. 02/28/24   [name], MD  Calcium  Carb-Cholecalciferol  (CALCIUM +D3) 600-20 MG-MCG TABS Take 1 tablet by mouth daily. 02/28/24  [name], MD  doxycycline  (VIBRA -TABS) 100 MG tablet Take 1 tablet (100 mg total) by mouth 2 (two) times daily. 03/07/24   [name], MD  ezetimibe  (ZETIA ) 10 MG tablet Take 1 tablet (10 mg total) by mouth daily. 02/28/24   [name], Angela M, MD  famotidine  (PEPCID ) 20 MG tablet Take 1 tablet (20 mg total) by mouth 2 (two) times daily. 02/28/24   [name], Angela M, MD  gabapentin  (NEURONTIN ) 300 MG capsule Take 1 capsule (300 mg total) by mouth 2 (two) times daily. 02/28/24   [name], Angela M, MD  hydrALAZINE  (APRESOLINE ) 50 MG tablet Take 1 tablet (50 mg total) by mouth 3 (three) times daily. Take 1 tablet (50 mg total) by mouth 3 (three) times daily. 03/01/24   [name], MD  lidocaine  (LIDODERM ) 5 % Place 1 patch onto the skin daily. 02/28/24   [name], Angela M, MD  losartan  (COZAAR ) 100 MG tablet Take 1 tablet (100 mg total) by mouth daily. 02/28/24   [name], Angela  M, MD  Mesalamine  800 MG TBEC Take 1 tablet (800 mg total) by mouth in the morning, at noon, and at bedtime. 02/28/24   [name], MD  methimazole  (TAPAZOLE ) 5 MG tablet 1 Tablet on Mondays, Wednesdays and Fridays and take 1/2 tablet daily the rest of the week. 02/28/24   [name], MD  oxyCODONE  (ROXICODONE ) 5 MG immediate release tablet Take 1 tablet (5 mg total) by mouth every 6 (six) hours as needed for severe pain (pain score 7-10). 03/05/24   [name], Angela M, MD  potassium chloride  SA (KLOR-CON  M) 20 MEQ tablet Take 1 tablet (20 mEq total) by mouth 2 (two) times daily. 02/28/24 02/27/25  [name], MD  rosuvastatin  (CRESTOR ) 10 MG tablet Take 1 tablet (10 mg total) by mouth daily. 02/28/24   [name], MD    Physical Exam: Vitals:   05/14/24 2217 05/14/24 2348 05/14/24 2349 05/15/24 0000  BP: (!) 156/58 (!) 166/46  (!) 153/43  Pulse: (!) 51   84  Resp: 17   14  Temp:   98.4 F (36.9 C)   TempSrc:   Oral   SpO2: 98%   99%  Weight:      Height:       General: Not in acute distress HEENT:       Eyes: PERRL, EOMI, no jaundice       ENT: No discharge from the ears and nose, no pharynx injection, no tonsillar enlargement.        Neck: positive JVD, no bruit, no mass felt. Heme: No neck lymph node enlargement. Cardiac: S1/S2, RRR, No gallops or rubs. Respiratory: has fine crackles bilaterally GI: Soft, nondistended, nontender, no rebound pain, no organomegaly, BS present. GU: No hematuria Ext: s/p of left  AKA. Has trace right leg edema. Musculoskeletal: No joint deformities, No joint redness or warmth, no limitation of ROM in spin. Skin: No rashes.  Neuro: Alert, oriented X3, cranial nerves II-XII grossly intact, moves all extremities normally.  Psych: Patient is not psychotic, no suicidal or hemocidal ideation.  Labs on Admission: I have personally reviewed following labs and imaging studies  CBC: Recent Labs  Lab 05/10/24 1259  05/14/24 1438  WBC 9.1 7.6  NEUTROABS 7.7*  --   HGB 8.8* 8.8*  HCT 29.6* 29.8*  MCV 75* 72.2*  PLT 232 383   Basic Metabolic Panel: Recent Labs  Lab 05/10/24 1259 05/14/24 1438  NA 137  136  K 4.2 4.3  CL 101 100  CO2 19* 23  GLUCOSE 103* 131*  BUN 21 19  CREATININE 0.72 0.70  CALCIUM  9.1 9.4  MG  --  2.0   GFR: Estimated Creatinine Clearance: 41.6 mL/min (by C-G formula based on SCr of 0.7 mg/dL). Liver Function Tests: Recent Labs  Lab 05/10/24 1259 05/14/24 1657  AST 12 24  ALT 10 10  ALKPHOS 75 73  BILITOT 0.5 0.3  PROT 5.8* 6.4*  ALBUMIN 3.7 3.6   No results for input(s): LIPASE, AMYLASE in the last 168 hours. No results for input(s): AMMONIA in the last 168 hours. Coagulation Profile: No results for input(s): INR, PROTIME in the last 168 hours. Cardiac Enzymes: No results for input(s): CKTOTAL, CKMB, CKMBINDEX, TROPONINI in the last 168 hours. BNP (last 3 results) Recent Labs    05/14/24 1657  PROBNP 2,472.0*   HbA1C: No results for input(s): HGBA1C in the last 72 hours. CBG: No results for input(s): GLUCAP in the last 168 hours. Lipid Profile: No results for input(s): CHOL, HDL, LDLCALC, TRIG, CHOLHDL, LDLDIRECT in the last 72 hours. Thyroid  Function Tests: No results for input(s): TSH, T4TOTAL, FREET4, T3FREE, THYROIDAB in the last 72 hours. Anemia Panel: No results for input(s): VITAMINB12, FOLATE, FERRITIN, TIBC, IRON , RETICCTPCT in the last 72 hours. Urine analysis:    Component Value Date/Time   COLORURINE YELLOW (A) 09/19/2022 0509   APPEARANCEUR HAZY (A) 09/19/2022 0509   APPEARANCEUR Clear 05/22/2014 2223   LABSPEC 1.011 09/19/2022 0509   LABSPEC 1.024 05/22/2014 2223   PHURINE 7.0 09/19/2022 0509   GLUCOSEU NEGATIVE 09/19/2022 0509   GLUCOSEU Negative 05/22/2014 2223   HGBUR LARGE (A) 09/19/2022 0509   BILIRUBINUR negative 01/28/2023 1012   BILIRUBINUR Negative 05/22/2014  2223   KETONESUR 5 (A) 09/19/2022 0509   PROTEINUR Negative 01/28/2023 1012   PROTEINUR 30 (A) 09/19/2022 0509   UROBILINOGEN 0.2 01/28/2023 1012   NITRITE positive 01/28/2023 1012   NITRITE NEGATIVE 09/19/2022 0509   LEUKOCYTESUR Moderate (2+) (A) 01/28/2023 1012   LEUKOCYTESUR SMALL (A) 09/19/2022 0509   LEUKOCYTESUR Trace 05/22/2014 2223   Sepsis Labs: @LABRCNTIP (procalcitonin:4,lacticidven:4) )No results found for this or any previous visit (from the past 240 hours).   Radiological Exams on Admission:   Assessment/Plan Principal Problem:   Acute on chronic diastolic congestive heart failure (HCC) Active Problems:   Myocardial injury   CAD (coronary artery disease)   Pulmonary embolism (HCC)   Essential (primary) hypertension   Hypercholesteremia   Hyperthyroidism   Ulcerative colitis (HCC)   History of TIA (transient ischemic attack)   Iron  deficiency anemia   Acute on chronic diastolic CHF (congestive heart failure) (HCC)   Assessment and Plan:   Acute on chronic diastolic congestive heart failure (HCC): pt has SOB, orthopnea, leg edema, positive JVD, elevated BNP 2472, pulmonary edema by chest x-ray, clinically consistent with CHF exacerbation.  2D echo 02/10/2024 showed EF of 55 to 60%.  Patient has 2L of new oxygen  requirement.  Patient does not have fever or leukocytosis.  Clinically does not have pneumonia.  -Will admit to tele bed   as inpatient -Lasix  20 mg bid by IV -Daily weights -strict I/O's -Low salt diet -Fluid restriction -As needed bronchodilators for shortness of breath  Myocardial injury and hx of CAD (coronary artery disease): trop  27 --> 26. No CP.  Likely due to demand ischemia. - Crestor , Zetia , - Patient is on Eliquis , will not give aspirin   Pulmonary embolism (HCC) -  Eliquis   Essential (primary) hypertension -IV hydralazine  as needed - Amlodipine , oral hydralazine , Cozaar   Hypercholesteremia -Zetia ,  Crestor   Hyperthyroidism -Methimazole   Ulcerative colitis (HCC) -Mesalamine   History of TIA (transient ischemic attack) -Patient is on Eliquis , Zetia , Crestor   Iron  deficiency anemia: Hemoglobin stable 8.8 (8.8 on 05/10/2024) -Follow-up with CBC        DVT ppx: on Eliquis   Code Status: DNR per pt  Family Communication:     not done, no family member is at bed side.  Disposition Plan:  Anticipate discharge back to previous environment  Consults called:  none  Admission status and Level of care: Telemetry:   as inpt        Dispo: The patient is from: Home              Anticipated d/c is to: Home              Anticipated d/c date is: 2 days              Patient currently is not medically stable to d/c.    Severity of Illness:  The appropriate patient status for this patient is INPATIENT. Inpatient status is judged to be reasonable and necessary in order to provide the required intensity of service to ensure the patient's safety. The patient's presenting symptoms, physical exam findings, and initial radiographic and laboratory data in the context of their chronic comorbidities is felt to place them at high risk for further clinical deterioration. Furthermore, it is not anticipated that the patient will be medically stable for discharge from the hospital within 2 midnights of admission.   * I certify that at the point of admission it is my clinical judgment that the patient will require inpatient hospital care spanning beyond 2 midnights from the point of admission due to high intensity of service, high risk for further deterioration and high frequency of surveillance required.*       Date of Service 05/15/2024    Caleb [facility]on [facility]   If 7PM-7AM, please contact night-coverage [email or web address] 05/15/2024, 1:48 AM     [1]  Allergies Allergen Reactions   Amoxicillin Anaphylaxis   Cephalosporins Swelling and Rash    Lip swelling and rash on CTX and  Vancomycin .   Naproxen Hives, Shortness Of Breath, Nausea And Vomiting and Swelling   Penicillins Shortness Of Breath, Swelling and Rash    Tolerated ceftriaxone  08/2022   Shellfish Allergy Diarrhea, Hives, Nausea And Vomiting, Swelling, Nausea Only and Other (See Comments)    Other Reaction(s): GI Intolerance   Vancomycin  Rash, Swelling, Dermatitis and Other (See Comments)    Occurred while on CTX and Vancomycin . More likely CTX as she has anaphylaxis to PCN, but should monitor closely.  Other Reaction(s): Angioedema, Facial Edema (intolerance)    Occurred while on CTX and Vancomycin . More likely CTX as she has anaphylaxis to PCN, but should monitor closely.   Codeine Nausea And Vomiting and Nausea Only    Other Reaction(s): GI Intolerance   Levofloxacin Nausea And Vomiting    Other Reaction(s): GI Intolerance

## 2024-05-14 NOTE — ED Notes (Signed)
 Changed pt brief at this time as purewick did not catch the urine as pt urinated in brief. Pt brief is changed and applied pads underneath pt. Pt purewick has been adjusted and patient bed is locked and in the lowest position. Call bell by bedside.

## 2024-05-14 NOTE — Telephone Encounter (Signed)
 FYI Only or Action Required?: ED advised, Refused  Patient was last seen in primary care on 05/10/2024 by Ostwalt, Janna, [name].  Called Nurse Triage reporting Shortness of Breath.  Symptoms began several days ago.  Interventions attempted: Nothing.  Symptoms are: gradually worsening.  Triage Disposition: Go to ED Now (Notify PCP)  Patient/caregiver understands and will follow disposition?: No, refuses disposition        Copied from CRM #[id number]. Topic: Clinical - Red Word Triage >> May 14, 2024 12:27 PM [place] MATSU wrote: Red Word that prompted transfer to Nurse Triage: labored breathing and restless and did not sleep well and she misplaced cup this morning and has no memory of it. Reason for Disposition  [1] MODERATE difficulty breathing (e.g., speaks in phrases, SOB even at rest, pulse 100-120) AND [2] NEW-onset or WORSE than normal  Answer Assessment - Initial Assessment Questions 1. RESPIRATORY STATUS: Describe your breathing? (e.g., wheezing, shortness of breath, unable to speak, severe coughing)      SOB  2. ONSET: When did this breathing problem begin?      Ongoing for a few days. Seen in office on 12/11  3. PATTERN Does the difficult breathing come and go, or has it been constant since it started?       Constant, worsens at night   4. SEVERITY: How bad is your breathing? (e.g., mild, moderate, severe)      Moderate   5. RECURRENT SYMPTOM: Have you had difficulty breathing before? If Yes, ask: When was the last time? and What happened that time?      Yes, when she had a blood clot in September 2025  6. CARDIAC HISTORY: Do you have any history of heart disease? (e.g., heart attack, angina, bypass surgery, angioplasty)      Heart attack in 1995  7. LUNG HISTORY: Do you have any history of lung disease?  (e.g., pulmonary embolus, asthma, emphysema)     No   8. CAUSE: What do you think is causing the breathing problem?      Unsure    9.  OTHER SYMPTOMS: Do you have any other symptoms? (e.g., chest pain, cough, dizziness, fever, runny nose)     Cough, runny nose several weeks   Daughter called patient and merged the call. The patient during triage reported moderate difficulty breathing. They both stated the patient was seen in office on 12/11 and does not wish to go back to the ED. Declined an ED visit for the moderate SOB. Daughter would like to be notified of next steps. In the Office visit note, it was advised if symptoms worsen to be seen in the ED. Call notified.  Protocols used: Breathing Difficulty-A-AH

## 2024-05-14 NOTE — Telephone Encounter (Signed)
 Patient advised. Reports she understands reluctantly.

## 2024-05-14 NOTE — ED Notes (Signed)
 CCMD called for cardiac monitoring.

## 2024-05-14 NOTE — ED Triage Notes (Signed)
 Patient c/o sob X1 week that is progressively getting worse. Currently has hemorrhoid that she reports she is bleeding from. Also concerned about right leg swelling. Reports some nausea. Chest discomfort started a week ago  Takes eliquis  daily   Patient has left leg amputation

## 2024-05-14 NOTE — ED Provider Notes (Signed)
 [facility] Provider Note    Event Date/Time   First MD Initiated Contact with Patient 05/14/24 1621     (approximate)   History   Shortness of Breath   HPI  Misty Page is a 83 y.o. female past medical history significant for hyperlipidemia, hypertension, history of PE that was provoked and on Eliquis , left leg amputation, presents to the emergency department for shortness of breath.  States that she has had progressively worsening shortness of breath over the past week.  Does endorse a cough.  States that her shortness of breath is worse at nighttime and when she goes to lay down.  Is also having some mild chest discomfort.  States that it feels similar but not quite as bad as whenever she had findings of a blood clot in her lungs.  Has not missed any Eliquis .  Denies any falls or trauma.  No nausea, vomiting or diaphoresis.  States that she has been sleeping propped up in a chair.  Had a recent echocardiogram.  On chart review last echocardiogram 02/10/2024 that showed no diastolic dysfunction and normal systolic function.  Moderate aortic stenosis.  09/21/2022 that showed an EF of 65 to 70% with no signs of diastolic heart failure, moderate calcification to aortic valve with mild regurgitation.  Mild to moderate aortic stenosis present.     Physical Exam   Triage Vital Signs: ED Triage Vitals  Encounter Vitals Group     BP 05/14/24 1436 (!) 142/70     Girls Systolic BP Percentile --      Girls Diastolic BP Percentile --      Boys Systolic BP Percentile --      Boys Diastolic BP Percentile --      Pulse Rate 05/14/24 1436 (!) 57     Resp 05/14/24 1436 (!) 22     Temp 05/14/24 1436 97.7 F (36.5 C)     Temp Source 05/14/24 1436 Oral     SpO2 05/14/24 1436 94 %     Weight 05/14/24 1432 122 lb (55.3 kg)     Height 05/14/24 1432 5' (1.524 m)     Head Circumference --      Peak Flow --      Pain Score 05/14/24 1432 5     Pain Loc --      Pain  Education --      Exclude from Growth Chart --     Most recent vital signs: Vitals:   05/14/24 1845 05/14/24 1900  BP:  (!) 176/56  Pulse: 61 (!) 54  Resp: 18 20  Temp:    SpO2: 96% 95%    Physical Exam Constitutional:      Appearance: She is well-developed.  HENT:     Head: Atraumatic.  Eyes:     Conjunctiva/sclera: Conjunctivae normal.  Cardiovascular:     Rate and Rhythm: Regular rhythm. Bradycardia present.  Pulmonary:     Effort: Tachypnea and respiratory distress present.     Breath sounds: Rhonchi present.     Comments: 89% on room air, placed on 2 L nasal cannula with improvement to 94%.  Speaking in 2 word sentences.  Respiratory distress with tachypnea.  No wheezing. Chest:     Chest wall: No tenderness.  Abdominal:     General: There is no distension.  Musculoskeletal:        General: Normal range of motion.     Cervical back: Normal range of motion.  Right lower leg: No edema.     Comments: AKA to the left lower extremity  Skin:    General: Skin is warm.     Capillary Refill: Capillary refill takes less than 2 seconds.  Neurological:     General: No focal deficit present.     Mental Status: She is alert. Mental status is at baseline.     IMPRESSION / MDM / ASSESSMENT AND PLAN / ED COURSE  I reviewed the triage vital signs and the nursing notes.  Differential diagnosis including heart failure, pneumonia, viral illness including COVID/influenza, pulmonary embolism  Moderate risk Wells criteria, is actively on Eliquis  but shortness of breath that feels similar to prior pulmonary embolism.  Will plan to repeat CTA  EKG  I, Clotilda Punter, the attending physician, personally viewed and interpreted this ECG.  EKG showed sinus bradycardia.  P waves present.  No obvious heart block.  Significant artifact.  Nonspecific ST changes.  Sinus bradycardia while on cardiac telemetry.  RADIOLOGY I independently reviewed imaging, my interpretation of imaging:  Chest x-ray with cardiomegaly and findings concerning for pulmonary edema  CTA -pending at time of admission, hospitalist will follow-up on read.  LABS (all labs ordered are listed, but only abnormal results are displayed) Labs interpreted as -    Labs Reviewed  BASIC METABOLIC PANEL WITH GFR - Abnormal; Notable for the following components:      Result Value   Glucose, Bld 131 (*)    All other components within normal limits  CBC - Abnormal; Notable for the following components:   Hemoglobin 8.8 (*)    HCT 29.8 (*)    MCV 72.2 (*)    MCH 21.3 (*)    MCHC 29.5 (*)    RDW 21.1 (*)    All other components within normal limits  PRO BRAIN NATRIURETIC PEPTIDE - Abnormal; Notable for the following components:   Pro Brain Natriuretic Peptide 2,472.0 (*)    All other components within normal limits  HEPATIC FUNCTION PANEL - Abnormal; Notable for the following components:   Total Protein 6.4 (*)    All other components within normal limits  BLOOD GAS, VENOUS - Abnormal; Notable for the following components:   pCO2, Ven 40 (*)    pO2, Ven 57 (*)    All other components within normal limits  TROPONIN T, HIGH SENSITIVITY - Abnormal; Notable for the following components:   Troponin T High Sensitivity 27 (*)    All other components within normal limits  TROPONIN T, HIGH SENSITIVITY - Abnormal; Notable for the following components:   Troponin T High Sensitivity 26 (*)    All other components within normal limits  PROTIME-INR  APTT  BASIC METABOLIC PANEL WITH GFR  CBC  MAGNESIUM      MDM   Clinical Course as of 05/14/24 2151  Mon May 14, 2024  1838 Patient hypoxic to 89%, placed on 2 L nasal cannula.  proBNP significantly elevated with findings concerning for acute heart failure exacerbation.  Given IV Lasix .  CTA is still pending.  Plan to consult hospitalist for admission for acute on chronic hypoxic respiratory failure and concern for new onset heart failure. [SM]  2006 Patient  with significant improvement of respiratory distress after being placed on oxygen .  Significant output following diuretics.  Taken over to CT scan now for CTA pulmonary embolism study. [SM]  2151 Multiple IVs that were infiltrated.  Required 2 ultrasound-guided IVs. [SM]    Clinical Course User Index [SM]  Suzanne Kirsch, MD     PROCEDURES:  Critical Care performed: No  .Ultrasound ED Peripheral IV (Provider)  Date/Time: 05/14/2024 8:38 PM  Performed by: Suzanne Kirsch, MD Authorized by: Suzanne Kirsch, MD   Procedure details:    Indications: multiple failed IV attempts     Skin Prep: chlorhexidine  gluconate     Location:  Right AC   Angiocath:  20 G   Bedside Ultrasound Guided: Yes     Images: not archived     Patient tolerated procedure without complications: Yes     Dressing applied: Yes   .Ultrasound ED Peripheral IV (Provider)  Date/Time: 05/14/2024 9:05 PM  Performed by: Suzanne Kirsch, MD Authorized by: Suzanne Kirsch, MD   Procedure details:    Indications: multiple failed IV attempts     Skin Prep: chlorhexidine  gluconate     Location:  Right AC   Angiocath:  20 G   Bedside Ultrasound Guided: Yes     Images: not archived     Patient tolerated procedure without complications: Yes     Dressing applied: Yes     Patient's presentation is most consistent with acute presentation with potential threat to life or bodily function.   MEDICATIONS ORDERED IN ED: Medications  albuterol  (PROVENTIL ) (2.5 MG/3ML) 0.083% nebulizer solution 2.5 mg (has no administration in time range)  dextromethorphan -guaiFENesin  (MUCINEX  DM) 30-600 MG per 12 hr tablet 1 tablet (has no administration in time range)  ondansetron  (ZOFRAN ) injection 4 mg (has no administration in time range)  hydrALAZINE  (APRESOLINE ) injection 5 mg (has no administration in time range)  acetaminophen  (TYLENOL ) tablet 650 mg (has no administration in time range)  furosemide  (LASIX ) injection 20 mg (20 mg  Intravenous Given 05/14/24 1841)  iohexol  (OMNIPAQUE ) 350 MG/ML injection 75 mL (75 mLs Intravenous Contrast Given 05/14/24 2043)    FINAL CLINICAL IMPRESSION(S) / ED DIAGNOSES   Final diagnoses:  Hypoxia  SOB (shortness of breath)     Rx / DC Orders   ED Discharge Orders     None        Note:  This document was prepared using Dragon voice recognition software and may include unintentional dictation errors.   Suzanne Kirsch, MD 05/14/24 2151

## 2024-05-14 NOTE — ED Notes (Signed)
 Pt c/o worsening SHOB and fatigue x1 week, no cough. Pt does not use oxygen  at home. Pt able to speak full sentences but tripoding slightly in the bed when speaking.

## 2024-05-15 LAB — CBC
HCT: 23.8 % — ABNORMAL LOW (ref 36.0–46.0)
Hemoglobin: 7.1 g/dL — ABNORMAL LOW (ref 12.0–15.0)
MCH: 21.3 pg — ABNORMAL LOW (ref 26.0–34.0)
MCHC: 29.8 g/dL — ABNORMAL LOW (ref 30.0–36.0)
MCV: 71.3 fL — ABNORMAL LOW (ref 80.0–100.0)
Platelets: 325 K/uL (ref 150–400)
RBC: 3.34 MIL/uL — ABNORMAL LOW (ref 3.87–5.11)
RDW: 20.9 % — ABNORMAL HIGH (ref 11.5–15.5)
WBC: 5.4 K/uL (ref 4.0–10.5)
nRBC: 0 % (ref 0.0–0.2)

## 2024-05-15 LAB — BASIC METABOLIC PANEL WITH GFR
Anion gap: 8 (ref 5–15)
BUN: 17 mg/dL (ref 8–23)
CO2: 26 mmol/L (ref 22–32)
Calcium: 8.4 mg/dL — ABNORMAL LOW (ref 8.9–10.3)
Chloride: 102 mmol/L (ref 98–111)
Creatinine, Ser: 0.63 mg/dL (ref 0.44–1.00)
GFR, Estimated: >60 mL/min (ref >60)
Glucose, Bld: 94 mg/dL (ref 70–99)
Potassium: 4 mmol/L (ref 3.5–5.1)
Sodium: 136 mmol/L (ref 135–145)

## 2024-05-15 LAB — PROTIME-INR
INR: 1.4 — ABNORMAL HIGH (ref 0.8–1.2)
Prothrombin Time: 17.8 s — ABNORMAL HIGH (ref 11.4–15.2)

## 2024-05-15 LAB — MAGNESIUM: Magnesium: 2 mg/dL (ref 1.7–2.4)

## 2024-05-15 LAB — APTT: aPTT: 36 s (ref 24–36)

## 2024-05-15 MED ORDER — FUROSEMIDE 10 MG/ML IJ SOLN
40.0000 mg | Freq: Two times a day (BID) | INTRAMUSCULAR | Status: DC
  Administered 2024-05-15 – 2024-05-16 (×3): 40 mg via INTRAVENOUS
  Filled 2024-05-15 (×3): qty 4

## 2024-05-15 MED ORDER — METHIMAZOLE 5 MG PO TABS
5.0000 mg | ORAL_TABLET | ORAL | Status: DC
  Administered 2024-05-16: 09:00:00 5 mg via ORAL
  Filled 2024-05-15: qty 1

## 2024-05-15 NOTE — Evaluation (Addendum)
 Physical Therapy Evaluation Patient Details Name: Misty Page MRN: [id number] DOB: [birth year] Today's Date: 05/15/2024  History of Present Illness  Misty Page is an 83 y.o. female who presented to ED on 05/14/24 with c/o SOB. PMH significant of L AKA,  HTN, gastric reflux, HLD, UC, CAD.  Clinical Impression  PT/OX cotx on this date. Pt received in semi-supine and able to mobilize to EOB without need for vc or physical assist. She performed a R sided stand/hop/pivot t/f into her transport chair with CGA for steadiness and proceeded to transfer back into bed leading with her left side. Pt reports she is able to do everything herself, including bed mobility, t/f, and manual w/c propulsion at home. She will continue to benefit from skilled therapy services during her hospital stay to optimize her CLOF.      If plan is discharge home, recommend the following: A little help with walking and/or transfers;A little help with bathing/dressing/bathroom   Can travel by private vehicle        Equipment Recommendations None recommended by PT  Recommendations for Other Services       Functional Status Assessment Patient has had a recent decline in their functional status and demonstrates the ability to make significant improvements in function in a reasonable and predictable amount of time.     Precautions / Restrictions Precautions Precautions: Fall Recall of Precautions/Restrictions: Intact Restrictions Weight Bearing Restrictions Per Provider Order: No      Mobility  Bed Mobility Overal bed mobility: Modified Independent             General bed mobility comments: able to mobilize to EOB without vc or assist    Transfers Overall transfer level: Needs assistance Equipment used: None Transfers: Bed to chair/wheelchair/BSC   Stand pivot transfers: Contact guard assist         General transfer comment: able to perform with CGA-- step/hop/pivot to the right     Ambulation/Gait               General Gait Details: NT  Stairs            Wheelchair Mobility     Tilt Bed    Modified Rankin (Stroke Patients Only)       Balance Overall balance assessment: Needs assistance Sitting-balance support: Feet unsupported, Single extremity supported Sitting balance-Leahy Scale: Good                                       Pertinent Vitals/Pain Pain Assessment Pain Assessment: No/denies pain    Home Living Family/patient expects to be discharged to:: Private residence Living Arrangements: Alone Available Help at Discharge: Personal care attendant (3x/week 4 hours at a time) Type of Home: House Home Access: Ramped entrance       Home Layout: One level Home Equipment: Tub bench;Grab bars - toilet;Grab bars - tub/shower;Hand held shower head;Wheelchair - Forensic Psychologist (2 wheels);BSC/3in1 Additional Comments: Per SPT note 02/11/24: pt states that she can have her PCA come an additional day a week if needed.    Prior Function Prior Level of Function : Needs assist             Mobility Comments: stand/hop/pivot to recliner ADLs Comments: PCA helps with ADLs like cooking and bathing when she comes.     Extremity/Trunk Assessment   Upper Extremity Assessment Upper Extremity Assessment: Overall WFL for tasks assessed  Lower Extremity Assessment Lower Extremity Assessment: LLE deficits/detail LLE Deficits / Details: hx AKA    Cervical / Trunk Assessment Cervical / Trunk Assessment: Kyphotic  Communication   Communication Communication: No apparent difficulties    Cognition Arousal: Alert Behavior During Therapy: WFL for tasks assessed/performed   PT - Cognitive impairments: No apparent impairments                         Following commands: Intact       Cueing Cueing Techniques: Verbal cues     General Comments      Exercises     Assessment/Plan    PT Assessment  Patient needs continued PT services  PT Problem List Decreased strength;Decreased activity tolerance;Decreased balance;Decreased mobility       PT Treatment Interventions Functional mobility training;Therapeutic exercise;Therapeutic activities;Balance training    PT Goals (Current goals can be found in the Care Plan section)  Acute Rehab PT Goals Patient Stated Goal: none stated PT Goal Formulation: With patient Time For Goal Achievement: 05/29/24 Potential to Achieve Goals: Good    Frequency Min 1X/week     Co-evaluation PT/OT/SLP Co-Evaluation/Treatment: Yes Reason for Co-Treatment: To address functional/ADL transfers PT goals addressed during session: Mobility/safety with mobility OT goals addressed during session: ADL's and self-care       AM-PAC PT 6 Clicks Mobility  Outcome Measure Help needed turning from your back to your side while in a flat bed without using bedrails?: None Help needed moving from lying on your back to sitting on the side of a flat bed without using bedrails?: None Help needed moving to and from a bed to a chair (including a wheelchair)?: A Little Help needed standing up from a chair using your arms (e.g., wheelchair or bedside chair)?: A Little Help needed to walk in hospital room?: Total Help needed climbing 3-5 steps with a railing? : Total 6 Click Score: 16    End of Session Equipment Utilized During Treatment: Gait belt Activity Tolerance: Patient tolerated treatment well Patient left: in bed;with bed alarm set;with call bell/phone within reach Nurse Communication: Mobility status PT Visit Diagnosis: Muscle weakness (generalized) (M62.81)    Time: 8494-8466 PT Time Calculation (min) (ACUTE ONLY): 28 min   Charges:                 Misty Page, SPT  Misty Page, PT, DPT Physical Therapist - [facility]  [facility]  [place] 05/15/2024, 4:05 PM

## 2024-05-15 NOTE — Progress Notes (Signed)
 PROGRESS NOTE    Misty Page  FMW:[id number] DOB: [birth year] DOA: 05/14/2024 PCP: [name], MD    Brief Narrative:  83 y.o. 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   Patient states that she has SOB for more than 1 week, which has been progressively worsening.  She has dry cough, no chest pain, fever or chills.  She has orthopnea.  No nausea, vomiting, diarrhea or abdominal pain.  No symptoms of UTI.    Assessment & Plan:   Principal Problem:   Acute on chronic diastolic congestive heart failure (HCC) Active Problems:   Myocardial injury   CAD (coronary artery disease)   Pulmonary embolism (HCC)   Essential (primary) hypertension   Hypercholesteremia   Hyperthyroidism   Ulcerative colitis (HCC)   History of TIA (transient ischemic attack)   Iron  deficiency anemia  Acute on chronic diastolic congestive heart failure (HCC): pt has SOB, orthopnea, leg edema, positive JVD, elevated BNP 2472, pulmonary edema by chest x-ray, clinically consistent with CHF exacerbation.  2D echo 02/10/2024 showed EF of 55 to 60%.  Patient has 2L of new oxygen  requirement.  Patient does not have fever or leukocytosis.  Clinically does not have pneumonia. Plan: Continue IV Lasix  Daily weights, strict I's and O's Defer repeat echocardiogram  Myocardial injury and hx of CAD (coronary artery disease): trop  27 --> 26. No CP.  Likely due to demand ischemia. Plan: Continue Eliquis  Continue Crestor  and Zetia   Pulmonary embolism (HCC) -Eliquis    Essential (primary) hypertension -IV hydralazine  as needed - Amlodipine , oral hydralazine , Cozaar    Hypercholesteremia -Zetia , Crestor    Hyperthyroidism -Methimazole    Ulcerative colitis (HCC) -Mesalamine    History of  TIA (transient ischemic attack) -Patient is on Eliquis , Zetia , Crestor    Iron  deficiency anemia: Hemoglobin stable 8.8 (8.8 on 05/10/2024) -Follow-up with CBC      DVT prophylaxis: Eliquis  Code Status: DNR Family Communication: None Disposition Plan: Status is: Inpatient Remains inpatient appropriate because: Decompensated heart failure   Level of care: Telemetry  Consultants:  None  Procedures:  None  Antimicrobials: None   Subjective: Seen and examined.  Resting in bed.  Reports shortness of breath is still persistent but improving.  Objective: Vitals:   05/15/24 0929 05/15/24 0930 05/15/24 0934 05/15/24 1332  BP: (!) 141/47 (!) 159/54 (!) 141/47 (!) 126/39  Pulse:  (!) 47  (!) 51  Resp:  (!) 26  (!) 21  Temp:    97.8 F (36.6 C)  TempSrc:    Oral  SpO2:  100%  100%  Weight:      Height:        Intake/Output Summary (Last 24 hours) at 05/15/2024 1411 Last data filed at 05/15/2024 1222 Gross per 24 hour  Intake --  Output 1300 ml  Net -1300 ml   Filed Weights   05/14/24 1432  Weight: 55.3 kg    Examination:  General exam: Appears calm and comfortable  Respiratory system: Bibasilar crackles.  Normal work of breathing.  2 L Cardiovascular system: S1-S2, RRR, no murmurs, no pedal edema Gastrointestinal system: Soft, T/ND, normal bowel sounds Central nervous system: Alert and oriented. No focal neurological deficits. Extremities: Left lower extremity AKA Skin: No rashes, lesions or ulcers Psychiatry: Judgement and insight appear normal. Mood & affect appropriate.     Data Reviewed: I  have personally reviewed following labs and imaging studies  CBC: Recent Labs  Lab 05/10/24 1259 05/14/24 1438 05/15/24 0415  WBC 9.1 7.6 5.4  NEUTROABS 7.7*  --   --   HGB 8.8* 8.8* 7.1*  HCT 29.6* 29.8* 23.8*  MCV 75* 72.2* 71.3*  PLT 232 383 325   Basic Metabolic Panel: Recent Labs  Lab 05/10/24 1259 05/14/24 1438 05/15/24 0415  NA 137 136 136  K  4.2 4.3 4.0  CL 101 100 102  CO2 19* 23 26  GLUCOSE 103* 131* 94  BUN 21 19 17   CREATININE 0.72 0.70 0.63  CALCIUM  9.1 9.4 8.4*  MG  --  2.0  --    GFR: Estimated Creatinine Clearance: 41.6 mL/min (by C-G formula based on SCr of 0.63 mg/dL). Liver Function Tests: Recent Labs  Lab 05/10/24 1259 05/14/24 1657  AST 12 24  ALT 10 10  ALKPHOS 75 73  BILITOT 0.5 0.3  PROT 5.8* 6.4*  ALBUMIN 3.7 3.6   No results for input(s): LIPASE, AMYLASE in the last 168 hours. No results for input(s): AMMONIA in the last 168 hours. Coagulation Profile: Recent Labs  Lab 05/15/24 0415  INR 1.4*   Cardiac Enzymes: No results for input(s): CKTOTAL, CKMB, CKMBINDEX, TROPONINI in the last 168 hours. BNP (last 3 results) Recent Labs    05/14/24 1657  PROBNP 2,472.0*   HbA1C: No results for input(s): HGBA1C in the last 72 hours. CBG: No results for input(s): GLUCAP in the last 168 hours. Lipid Profile: No results for input(s): CHOL, HDL, LDLCALC, TRIG, CHOLHDL, LDLDIRECT in the last 72 hours. Thyroid  Function Tests: No results for input(s): TSH, T4TOTAL, FREET4, T3FREE, THYROIDAB in the last 72 hours. Anemia Panel: No results for input(s): VITAMINB12, FOLATE, FERRITIN, TIBC, IRON , RETICCTPCT in the last 72 hours. Sepsis Labs: No results for input(s): PROCALCITON, LATICACIDVEN in the last 168 hours.  No results found for this or any previous visit (from the past 240 hours).       Radiology Studies: CT Angio Chest Pulmonary Embolism (PE) W or WO Contrast Result Date: 05/14/2024 EXAM: CTA of the Chest with contrast for PE 05/14/2024 09:12:59 PM TECHNIQUE: CTA of the chest was performed after the administration of 75 mL Omnipaque  350 mg/mL intravenous contrast. Multiplanar reformatted images are provided for review. MIP images are provided for review. Automated exposure control, iterative reconstruction, and/or weight based  adjustment of the mA/kV was utilized to reduce the radiation dose to as low as reasonably achievable. COMPARISON: Prior examination. CLINICAL HISTORY: Shortness of breath and chest pain FINDINGS: PULMONARY ARTERIES: The pulmonary artery shows a normal branching pattern bilaterally. No intraluminal filling defect to suggest pulmonary embolism is noted. Main pulmonary artery is normal in caliber. MEDIASTINUM: The heart is at the upper limits of normal in size. Coronary calcifications are seen. Aortic calcifications are noted without aneurysmal dilatation. Right innominate arterial stent is seen. The thoracic inlet is within normal limits. The pericardium demonstrate no acute abnormality. LYMPH NODES: No mediastinal, hilar or axillary lymphadenopathy. LUNGS AND PLEURA: Bilateral pleural effusions are noted right slightly greater than left with associated bibasilar atelectasis. Patchy and left upper lobe infiltrate is noted increased when compared with the prior examination. No pneumothorax. UPPER ABDOMEN: Visualized upper abdomen again shows renal cystic change. Renal arterial stenting is noted on the left. SOFT TISSUES AND BONES: Mild breast asymmetry is noted. No acute bone or soft tissue abnormality. IMPRESSION: 1. No pulmonary embolism. 2. Bilateral pleural effusions, right slightly greater than  left, with associated bibasilar atelectasis. 3. Increased patchy left upper lobe airspace disease compared to prior, compatible with pneumonia or aspiration. Electronically signed by: Oneil Devonshire MD 05/14/2024 09:22 PM EST RP Workstation: [id number]   US  Venous Img Lower Unilateral Right Result Date: 05/14/2024 CLINICAL DATA:  Swelling for 2 days EXAM: RIGHT LOWER EXTREMITY VENOUS DOPPLER ULTRASOUND TECHNIQUE: Gray-scale sonography with compression, as well as color and duplex ultrasound, were performed to evaluate the deep venous system(s) from the level of the common femoral vein through the popliteal and proximal  calf veins. COMPARISON:  02/10/2024 FINDINGS: VENOUS Normal compressibility of the common femoral, superficial femoral, and popliteal veins, as well as the visualized calf veins. Visualized portions of profunda femoral vein and great saphenous vein unremarkable. No filling defects to suggest DVT on grayscale or color Doppler imaging. Doppler waveforms show normal direction of venous flow, normal respiratory plasticity and response to augmentation. Limited views of the contralateral common femoral vein are unremarkable. OTHER None. Limitations: none IMPRESSION: No right lower extremity DVT. Electronically Signed   By: Aliene Lloyd M.D.   On: 05/14/2024 16:24   DG Chest 2 View Result Date: 05/14/2024 CLINICAL DATA:  Shortness of breath and chest pain. EXAM: DG CHEST 2V COMPARISON:  Chest radiograph dated 09/20/2022. FINDINGS: Small bilateral pleural effusions and bibasilar atelectasis or infiltrate. There is cardiomegaly with vascular congestion and edema. No pneumothorax. Atherosclerotic calcification of the aorta no acute osseous pathology. IMPRESSION: Cardiomegaly with findings of CHF. Electronically Signed   By: Vanetta Chou M.D.   On: 05/14/2024 15:06        Scheduled Meds:  acidophilus  1 capsule Oral Daily   amLODipine   5 mg Oral Daily   apixaban   5 mg Oral BID   vitamin C   1,000 mg Oral q morning   ezetimibe   10 mg Oral Daily   famotidine   20 mg Oral BID   furosemide   40 mg Intravenous BID   gabapentin   300 mg Oral BID   hydrALAZINE   50 mg Oral TID   losartan   100 mg Oral Daily   Mesalamine   800 mg Oral TID   methimazole   2.5 mg Oral Q T,Th,S,Su   [START ON 05/16/2024] methIMAzole   5 mg Oral Q M,W,F   rosuvastatin   10 mg Oral Daily   Continuous Infusions:   LOS: 1 day     Misty KATHEE Robson, MD [facility]   If 7PM-7AM, please contact night-coverage  05/15/2024, 2:11 PM

## 2024-05-15 NOTE — ED Notes (Signed)
 Pt has been ordered mesalamine  800 mg EC PO TID from her PTA med list, we do not have this exact formulation available, Pt needs to be asked if they can bring their medication from home.

## 2024-05-15 NOTE — Evaluation (Signed)
 Occupational Therapy Evaluation Patient Details Name: Misty Page MRN: [id number] DOB: Dec 08, 1940 Today's Date: 05/15/2024   History of Present Illness   Misty Page is an 83 y.o. female who presented to ED on 05/14/24 with c/o SOB. PMH significant of L AKA,  HTN, gastric reflux, HLD, UC, CAD.     Clinical Impressions Pt admitted with above. Prior to admission, pt lives alone with PCA assisting with household chores, groceries and transportation. Pt able to complete ADLs/meal prep, transfers and mobility to self-propel wheelchair at mod independent level with hx of L AKA. Pt close to functional baseline, mildly dyspneic after transfers with SpO2 95%> on 2L via [place] (does not use O2 at baseline). Able to perform bed mobility mod independent, CGA for SPT bed <> manual wheelchair. Pt would benefit from skilled OT services to address noted impairments and functional limitations (see below for any additional details) in order to maximize safety and independence while minimizing falls risk and caregiver burden. Anticipate the need for follow up [facility] OT services upon acute hospital DC.      If plan is discharge home, recommend the following:   A little help with walking and/or transfers;A little help with bathing/dressing/bathroom;Assist for transportation     Functional Status Assessment   Patient has had a recent decline in their functional status and demonstrates the ability to make significant improvements in function in a reasonable and predictable amount of time.     Equipment Recommendations   None recommended by OT      Precautions/Restrictions   Precautions Precautions: Fall Recall of Precautions/Restrictions: Intact Precaution/Restrictions Comments: L AKA (chronic) Restrictions Weight Bearing Restrictions Per Provider Order: No     Mobility Bed Mobility Overal bed mobility: Modified Independent                  Transfers Overall transfer level: Needs  assistance Equipment used: None Transfers: Bed to chair/wheelchair/BSC   Stand pivot transfers: Contact guard assist         General transfer comment: bed <> transport chair, assist for lines      Balance Overall balance assessment: Needs assistance Sitting-balance support: Feet unsupported, Single extremity supported Sitting balance-Leahy Scale: Good                                     ADL either performed or assessed with clinical judgement   ADL Overall ADL's : Needs assistance/impaired                         Toilet Transfer: Contact guard assist;BSC/3in1 Toilet Transfer Details (indicate cue type and reason): CGA for SPT (pt able to turn 180 deg to manual wheelchair with good stability) Toileting- Clothing Manipulation and Hygiene: Supervision/safety;Sitting/lateral lean       Functional mobility during ADLs: Contact guard assist General ADL Comments: pt likely close to baseline for ADL performance     Vision Baseline Vision/History: 1 Wears glasses Ability to See in Adequate Light: 0 Adequate Patient Visual Report: No change from baseline              Pertinent Vitals/Pain Pain Assessment Pain Assessment: No/denies pain     Extremity/Trunk Assessment Upper Extremity Assessment Upper Extremity Assessment: Overall WFL for tasks assessed;Right hand dominant   Lower Extremity Assessment Lower Extremity Assessment: Defer to PT evaluation LLE Deficits / Details: hx AKA   Cervical / Trunk  Assessment Cervical / Trunk Assessment: Kyphotic   Communication Communication Communication: No apparent difficulties   Cognition Arousal: Alert Behavior During Therapy: WFL for tasks assessed/performed Cognition: No apparent impairments                               Following commands: Intact       Cueing  General Comments   Cueing Techniques: Verbal cues  Spo2 95%> on 2L O2. Pt mildly dyspneic after activity.    Exercises          Home Living Family/patient expects to be discharged to:: Private residence Living Arrangements: Alone Available Help at Discharge: Personal care attendant (3x/week 4 hours at a time) Type of Home: House Home Access: Ramped entrance     Home Layout: One level     Bathroom Shower/Tub: Chief Strategy Officer: Standard Bathroom Accessibility: Yes   Home Equipment: Tub bench;Grab bars - toilet;Grab bars - tub/shower;Hand held shower head;Wheelchair - Forensic Psychologist (2 wheels);BSC/3in1   Additional Comments: Per SPT note 02/11/24: pt states that she can have her PCA come an additional day a week if needed.      Prior Functioning/Environment Prior Level of Function : Needs assist             Mobility Comments: stand/hop/pivot to recliner ADLs Comments: pt mod ind at wheelchair level. pt able to perform IADLs such as meal prep, PCA assists with groceries and shopping, cleaning    OT Problem List: Decreased activity tolerance;Cardiopulmonary status limiting activity;Impaired balance (sitting and/or standing)   OT Treatment/Interventions: Self-care/ADL training;Energy conservation;DME and/or AE instruction;Patient/family education;Balance training;Therapeutic activities      OT Goals(Current goals can be found in the care plan section)   Acute Rehab OT Goals OT Goal Formulation: With patient Time For Goal Achievement: 05/29/24 Potential to Achieve Goals: Good   OT Frequency:  Min 1X/week    Co-evaluation PT/OT/SLP Co-Evaluation/Treatment: Yes Reason for Co-Treatment: To address functional/ADL transfers PT goals addressed during session: Mobility/safety with mobility OT goals addressed during session: ADL's and self-care      AM-PAC OT 6 Clicks Daily Activity     Outcome Measure Help from another person eating meals?: None Help from another person taking care of personal grooming?: None Help from another person toileting,  which includes using toliet, bedpan, or urinal?: A Little Help from another person bathing (including washing, rinsing, drying)?: A Little Help from another person to put on and taking off regular upper body clothing?: A Little Help from another person to put on and taking off regular lower body clothing?: A Lot 6 Click Score: 19   End of Session Equipment Utilized During Treatment: Gait belt Nurse Communication: Mobility status  Activity Tolerance: Patient tolerated treatment well Patient left: in bed;with call bell/phone within reach;with bed alarm set  OT Visit Diagnosis: Muscle weakness (generalized) (M62.81);Other abnormalities of gait and mobility (R26.89)                Time: 8498-8466 OT Time Calculation (min): 32 min Charges:  OT General Charges $OT Visit: 1 Visit OT Evaluation $OT Eval Low Complexity: 1 Low Rayhaan Huster L. Trayden Brandy, OTR/L  05/15/2024, 4:51 PM

## 2024-05-15 NOTE — Progress Notes (Signed)
 Heart Failure Navigator Progress Note  Assessed for Heart & Vascular TOC clinic readiness.  Patient does not meet criteria due to current [facility] Cardiology patient.   Navigator will sign off at this time.  Roxy Horseman, RN, BSN [facility] Heart Failure Navigator Secure Chat Only

## 2024-05-15 NOTE — ED Notes (Signed)
 Assisted pt to commode at this time. Pt had a small BM. Changed linens on bed.

## 2024-05-15 NOTE — ED Notes (Signed)
 Pt resting comfortably at this time. No needs or requests expressed.

## 2024-05-16 DIAGNOSIS — I5033 Acute on chronic diastolic (congestive) heart failure: Secondary | ICD-10-CM | POA: Diagnosis not present

## 2024-05-16 MED ORDER — MESALAMINE 400 MG PO CPDR
800.0000 mg | DELAYED_RELEASE_CAPSULE | Freq: Three times a day (TID) | ORAL | Status: DC
  Administered 2024-05-16: 12:00:00 800 mg via ORAL
  Filled 2024-05-16 (×2): qty 2

## 2024-05-16 MED ORDER — POTASSIUM CHLORIDE CRYS ER 10 MEQ PO TBCR: 10.0000 meq | EXTENDED_RELEASE_TABLET | Freq: Two times a day (BID) | ORAL | 0 refills | Status: DC

## 2024-05-16 MED ORDER — FUROSEMIDE 20 MG PO TABS: 20.0000 mg | ORAL_TABLET | Freq: Every day | ORAL | 0 refills | Status: DC

## 2024-05-16 NOTE — TOC Initial Note (Signed)
 Transition of Care [facility]) - Initial/Assessment Note    Patient Details  Name: Misty Page MRN: [id number] Date of Birth: 1941-03-09  [facility] CM/SW Contact:    Georganna Maxson L Joud Pettinato, LCSW Phone Number: 05/16/2024, 10:03 AM  Clinical Narrative:                    Secure chat received. Orders entered for HHPT and OT. [facility] search revealed that patient was receiving [facility] in November. Request sent in EPIC portal for resumption.      Patient Goals and CMS Choice            Expected Discharge Plan and Services                                              Prior Living Arrangements/Services                       Activities of Daily Living      Permission Sought/Granted                  Emotional Assessment              Admission diagnosis:  Acute on chronic diastolic congestive heart failure (HCC) [I50.33] Acute on chronic diastolic CHF (congestive heart failure) (HCC) [I50.33] Patient Active Problem List   Diagnosis Date Noted   Acute on chronic diastolic congestive heart failure (HCC) 05/14/2024   Pulmonary embolism (HCC) 05/14/2024   Myocardial injury 05/14/2024   Bilateral pleural effusion 02/10/2024   Acute pulmonary embolism without acute cor pulmonale (HCC) 02/10/2024   Acute pulmonary embolism with acute cor pulmonale (HCC) 02/09/2024   Acute respiratory failure with hypoxia (HCC) 02/09/2024   History of pelvic fracture 01/21/24 02/09/2024   Tremor 09/09/2023   Senile purpura 03/04/2023   Phantom limb pain (HCC) 11/26/2022   Urolithiasis 09/19/2022   Right ureteral stone 09/19/2022   Iron  deficiency anemia 09/19/2022   CAD (coronary artery disease) 09/19/2022   Lung nodule 09/19/2022   Hydroureteronephrosis 09/19/2022   Atherosclerosis of native coronary artery of native heart with other form of angina pectoris 07/23/2022   Atherosclerosis of native arteries of extremity with intermittent  claudication 07/15/2022   Lymphedema 01/02/2022   Intractable vomiting with nausea 04/13/2021   Vertigo 04/13/2021   Chronic anticoagulation 10/30/2020   Change in bowel habits 05/01/2020   Chronic mesenteric ischemia 02/13/2020   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) 05/10/2019   Memory changes 05/10/2019   Infected orthopedic implant, initial encounter 03/07/2019   Reactive depression 02/25/2019   Bilateral carotid artery disease 06/13/2018   Sarcoma of left thigh (HCC) 06/13/2018   History of sarcoma s/p left AKA 05/08/2018   Innominate artery stenosis 06/15/2017   History of TIA (transient ischemic attack) 04/18/2017   Bilateral carotid artery stenosis 02/07/2017   Allergic rhinitis, seasonal 10/15/2014   Cardiac murmur 10/15/2014   History of colon polyps 10/15/2014   Hyperthyroidism 10/15/2014   Cannot sleep 10/15/2014   Herpes zona 10/15/2014   Ulcerative colitis (HCC) 10/15/2014   Moderate aortic stenosis 12/03/2013   Arteriosclerosis of coronary artery 11/23/2013   History of MI (myocardial infarction) 11/23/2013   Peripheral vascular disease 11/23/2013   Diverticulitis of colon 07/21/2009   Hemorrhoids without complication 01/27/2009   Atherosclerotic heart disease 10/29/2008  Essential (primary) hypertension 10/29/2008   Acid reflux 10/29/2008   Hypercholesteremia 10/29/2008   OP (osteoporosis) 10/29/2008   PCP:  [name], MD Pharmacy:   [facility] - [place], [name] - [address] [name] [address] Phone: [phone] Fax: [phone]     Social Drivers of Health (SDOH) Social History: SDOH Screenings   Food Insecurity: No Food Insecurity (04/13/2024)   Received from [facility]  Housing: Low Risk  (04/13/2024)   Received from [facility]  Transportation Needs: No Transportation Needs (04/13/2024)   Received from [facility]  Utilities: Not At Risk (04/13/2024)   Received from [facility]  Alcohol Screen: Low Risk (12/28/2023)  Depression (PHQ2-9): Low Risk (12/28/2023)  Financial Resource Strain: Low Risk  (04/13/2024)   Received from [facility]  Physical Activity: Inactive (12/28/2023)  Social Connections: Socially Isolated (02/13/2024)  Stress: No Stress Concern Present (12/28/2023)  Tobacco Use: Medium Risk (05/14/2024)  Health Literacy: Adequate Health Literacy (12/28/2023)   SDOH Interventions:     Readmission Risk Interventions     No data to display

## 2024-05-16 NOTE — Discharge Summary (Signed)
 Physician Discharge Summary  Misty Page FMW:[id number] DOB: 15-Nov-1940 DOA: 05/14/2024  PCP: [name], MD  Admit date: 05/14/2024 Discharge date: 05/16/2024  Admitted From: Home Disposition:  Home with jpo  Recommendations for Outpatient Follow-up:  Follow up with PCP in 1-2 weeks Ambulatory referral to cardiology  Home Health:Yes PT OT  Equipment/Devices:None   Discharge Condition:Stable  CODE STATUS:DNR- limited  Diet recommendation: Heart healthy  Brief/Interim Summary:  83 y.o. 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   Patient states that she has SOB for more than 1 week, which has been progressively worsening.  She has dry cough, no chest pain, fever or chills.  She has orthopnea.  No nausea, vomiting, diarrhea or abdominal pain.  No symptoms of UTI.      Discharge Diagnoses:  Principal Problem:   Acute on chronic diastolic congestive heart failure (HCC) Active Problems:   Myocardial injury   CAD (coronary artery disease)   Pulmonary embolism (HCC)   Essential (primary) hypertension   Hypercholesteremia   Hyperthyroidism   Ulcerative colitis (HCC)   History of TIA (transient ischemic attack)   Iron  deficiency anemia  Acute on chronic diastolic congestive heart failure (HCC)  pt has SOB, orthopnea, leg edema, positive JVD, elevated BNP 2472, pulmonary edema by chest x-ray, clinically consistent with CHF exacerbation.  2D echo 02/10/2024 showed EF of 55 to 60%.  Patient has 2L of new oxygen  requirement.  Patient does not have fever or leukocytosis.  Clinically does not have pneumonia. Plan: Lengthy discussion with patient and patient's daughter at bedside.  Patient has responded intravenous Lasix .  Per patient's daughter she has had  recurrent admissions for fluid overloaded state.  Has not been on maintenance diuretic.  At this time at the time of discharge I will initiate Lasix  20 mg daily with potassium replacement 10 mEq daily.  Ambulatory referral to cardiology placed for further evaluation and management of chronic diastolic congestive heart failure.  Patient on room air without desaturation and does not qualify for home oxygen  at this time.  Myocardial injury and hx of CAD (coronary artery disease): trop  27 --> 26. No CP.  Likely due to demand ischemia. Plan: Continue Eliquis  Continue Crestor  and Zetia    Pulmonary embolism (HCC) -Eliquis    Essential (primary) hypertension -IV hydralazine  as needed - Amlodipine , oral hydralazine , Cozaar    Hypercholesteremia -Zetia , Crestor    Hyperthyroidism -Methimazole    Ulcerative colitis (HCC) -Mesalamine    History of TIA (transient ischemic attack) -Patient is on Eliquis , Zetia , Crestor    Iron  deficiency anemia: Patient states that her primary care has taken her off of her iron  supplementation.  Encouraged this to be revisited as patient does have significant iron  deficiency anemia.  Appears chronic as patient does not have oxygen  desaturation or hemodynamic compromise   Discharge Instructions  Discharge Instructions     Ambulatory referral to Cardiology   Complete by: As directed    Increase activity slowly   Complete by: As directed       Allergies as of 05/16/2024       Reactions   Amoxicillin Anaphylaxis   Cephalosporins Swelling, Rash   Lip swelling and rash on CTX and Vancomycin .   Naproxen Hives, Shortness Of Breath, Nausea And Vomiting, Swelling   Penicillins Shortness Of Breath, Swelling, Rash   Tolerated ceftriaxone  08/2022  Shellfish Allergy Diarrhea, Hives, Nausea And Vomiting, Swelling, Nausea Only, Other (See Comments)   Other Reaction(s): GI Intolerance   Vancomycin  Rash, Swelling, Dermatitis, Other (See Comments)   Occurred while on  CTX and Vancomycin . More likely CTX as she has anaphylaxis to PCN, but should monitor closely. Other Reaction(s): Angioedema, Facial Edema (intolerance)    Occurred while on CTX and Vancomycin . More likely CTX as she has anaphylaxis to PCN, but should monitor closely.   Codeine Nausea And Vomiting, Nausea Only   Other Reaction(s): GI Intolerance   Levofloxacin Nausea And Vomiting   Other Reaction(s): GI Intolerance        Medication List     STOP taking these medications    doxycycline  100 MG tablet Commonly known as: VIBRA -TABS   lidocaine  5 % Commonly known as: LIDODERM    oxyCODONE  5 MG immediate release tablet Commonly known as: Roxicodone        TAKE these medications    acetaminophen  500 MG tablet Commonly known as: TYLENOL  Take 500-1,000 mg by mouth every 8 (eight) hours as needed.   amLODipine  5 MG tablet Commonly known as: NORVASC  Take 1 tablet (5 mg total) by mouth daily.   apixaban  5 MG Tabs tablet Commonly known as: ELIQUIS  Take 1 tablet (5 mg total) by mouth 2 (two) times daily.   Calcium  Carb-Cholecalciferol  600-20 MG-MCG Tabs Commonly known as: Calcium +D3 Take 1 tablet by mouth daily.   ezetimibe  10 MG tablet Commonly known as: ZETIA  Take 1 tablet (10 mg total) by mouth daily.   famotidine  20 MG tablet Commonly known as: PEPCID  Take 1 tablet (20 mg total) by mouth 2 (two) times daily.   furosemide  20 MG tablet Commonly known as: Lasix  Take 1 tablet (20 mg total) by mouth daily.   gabapentin  300 MG capsule Commonly known as: NEURONTIN  Take 1 capsule (300 mg total) by mouth 2 (two) times daily.   hydrALAZINE  50 MG tablet Commonly known as: APRESOLINE  Take 1 tablet (50 mg total) by mouth 3 (three) times daily. Take 1 tablet (50 mg total) by mouth 3 (three) times daily.   losartan  100 MG tablet Commonly known as: COZAAR  Take 1 tablet (100 mg total) by mouth daily.   Mesalamine  800 MG Tbec Take 1 tablet (800 mg total) by mouth in the  morning, at noon, and at bedtime.   methimazole  5 MG tablet Commonly known as: TAPAZOLE  1 Tablet on Mondays, Wednesdays and Fridays and take 1/2 tablet daily the rest of the week.   potassium chloride  10 MEQ tablet Commonly known as: KLOR-CON  M Take 1 tablet (10 mEq total) by mouth 2 (two) times daily. What changed:  medication strength how much to take   Probiotic-Prebiotic 1-250 BILLION-MG Caps Take 1 capsule by mouth daily.   rosuvastatin  10 MG tablet Commonly known as: CRESTOR  Take 1 tablet (10 mg total) by mouth daily.   vitamin C  1000 MG tablet Take 1 tablet (1,000 mg total) by mouth every morning.        Contact information for after-discharge care     Home Medical Care     [facility]) .   Service: Home Health Services                    Allergies[1]  Consultations: None   Procedures/Studies: CT Angio Chest Pulmonary Embolism (PE) W or WO Contrast Result Date: 05/14/2024 EXAM: CTA of the Chest with contrast for PE 05/14/2024 09:12:59 PM TECHNIQUE: CTA of the  chest was performed after the administration of 75 mL Omnipaque  350 mg/mL intravenous contrast. Multiplanar reformatted images are provided for review. MIP images are provided for review. Automated exposure control, iterative reconstruction, and/or weight based adjustment of the mA/kV was utilized to reduce the radiation dose to as low as reasonably achievable. COMPARISON: Prior examination. CLINICAL HISTORY: Shortness of breath and chest pain FINDINGS: PULMONARY ARTERIES: The pulmonary artery shows a normal branching pattern bilaterally. No intraluminal filling defect to suggest pulmonary embolism is noted. Main pulmonary artery is normal in caliber. MEDIASTINUM: The heart is at the upper limits of normal in size. Coronary calcifications are seen. Aortic calcifications are noted without aneurysmal dilatation. Right innominate arterial stent is seen. The thoracic inlet is  within normal limits. The pericardium demonstrate no acute abnormality. LYMPH NODES: No mediastinal, hilar or axillary lymphadenopathy. LUNGS AND PLEURA: Bilateral pleural effusions are noted right slightly greater than left with associated bibasilar atelectasis. Patchy and left upper lobe infiltrate is noted increased when compared with the prior examination. No pneumothorax. UPPER ABDOMEN: Visualized upper abdomen again shows renal cystic change. Renal arterial stenting is noted on the left. SOFT TISSUES AND BONES: Mild breast asymmetry is noted. No acute bone or soft tissue abnormality. IMPRESSION: 1. No pulmonary embolism. 2. Bilateral pleural effusions, right slightly greater than left, with associated bibasilar atelectasis. 3. Increased patchy left upper lobe airspace disease compared to prior, compatible with pneumonia or aspiration. Electronically signed by: Oneil Devonshire MD 05/14/2024 09:22 PM EST RP Workstation: [id number]   US  Venous Img Lower Unilateral Right Result Date: 05/14/2024 CLINICAL DATA:  Swelling for 2 days EXAM: RIGHT LOWER EXTREMITY VENOUS DOPPLER ULTRASOUND TECHNIQUE: Gray-scale sonography with compression, as well as color and duplex ultrasound, were performed to evaluate the deep venous system(s) from the level of the common femoral vein through the popliteal and proximal calf veins. COMPARISON:  02/10/2024 FINDINGS: VENOUS Normal compressibility of the common femoral, superficial femoral, and popliteal veins, as well as the visualized calf veins. Visualized portions of profunda femoral vein and great saphenous vein unremarkable. No filling defects to suggest DVT on grayscale or color Doppler imaging. Doppler waveforms show normal direction of venous flow, normal respiratory plasticity and response to augmentation. Limited views of the contralateral common femoral vein are unremarkable. OTHER None. Limitations: none IMPRESSION: No right lower extremity DVT. Electronically Signed   By:  Aliene Lloyd M.D.   On: 05/14/2024 16:24   DG Chest 2 View Result Date: 05/14/2024 CLINICAL DATA:  Shortness of breath and chest pain. EXAM: DG CHEST 2V COMPARISON:  Chest radiograph dated 09/20/2022. FINDINGS: Small bilateral pleural effusions and bibasilar atelectasis or infiltrate. There is cardiomegaly with vascular congestion and edema. No pneumothorax. Atherosclerotic calcification of the aorta no acute osseous pathology. IMPRESSION: Cardiomegaly with findings of CHF. Electronically Signed   By: Vanetta Chou M.D.   On: 05/14/2024 15:06      Subjective: Seen and examined at the time of of discharge.  Stable no distress.  Appropriate discharge home.  Discharge Exam: Vitals:   05/16/24 1100 05/16/24 1117  BP: (!) 148/48   Pulse: (!) 45   Resp: 14   Temp:  98.5 F (36.9 C)  SpO2: 97%    Vitals:   05/16/24 1000 05/16/24 1030 05/16/24 1100 05/16/24 1117  BP: (!) 138/44 (!) 132/46 (!) 148/48   Pulse: (!) 51 (!) 50 (!) 45   Resp: (!) 21 20 14    Temp:    98.5 F (36.9 C)  TempSrc:  Oral  SpO2: 96% 96% 97%   Weight:      Height:        General: Pt is alert, awake, not in acute distress Cardiovascular: RRR, S1/S2 +, no rubs, no gallops Respiratory: CTA bilaterally, no wheezing, no rhonchi Abdominal: Soft, NT, ND, bowel sounds + Extremities: no edema, no cyanosis    The results of significant diagnostics from this hospitalization (including imaging, microbiology, ancillary and laboratory) are listed below for reference.     Microbiology: No results found for this or any previous visit (from the past 240 hours).   Labs: BNP (last 3 results) No results for input(s): BNP in the last 8760 hours. Basic Metabolic Panel: Recent Labs  Lab 05/10/24 1259 05/14/24 1438 05/15/24 0415  NA 137 136 136  K 4.2 4.3 4.0  CL 101 100 102  CO2 19* 23 26  GLUCOSE 103* 131* 94  BUN 21 19 17   CREATININE 0.72 0.70 0.63  CALCIUM  9.1 9.4 8.4*  MG  --  2.0  --    Liver  Function Tests: Recent Labs  Lab 05/10/24 1259 05/14/24 1657  AST 12 24  ALT 10 10  ALKPHOS 75 73  BILITOT 0.5 0.3  PROT 5.8* 6.4*  ALBUMIN 3.7 3.6   No results for input(s): LIPASE, AMYLASE in the last 168 hours. No results for input(s): AMMONIA in the last 168 hours. CBC: Recent Labs  Lab 05/10/24 1259 05/14/24 1438 05/15/24 0415  WBC 9.1 7.6 5.4  NEUTROABS 7.7*  --   --   HGB 8.8* 8.8* 7.1*  HCT 29.6* 29.8* 23.8*  MCV 75* 72.2* 71.3*  PLT 232 383 325   Cardiac Enzymes: No results for input(s): CKTOTAL, CKMB, CKMBINDEX, TROPONINI in the last 168 hours. BNP: Invalid input(s): POCBNP CBG: No results for input(s): GLUCAP in the last 168 hours. D-Dimer No results for input(s): DDIMER in the last 72 hours. Hgb A1c No results for input(s): HGBA1C in the last 72 hours. Lipid Profile No results for input(s): CHOL, HDL, LDLCALC, TRIG, CHOLHDL, LDLDIRECT in the last 72 hours. Thyroid  function studies No results for input(s): TSH, T4TOTAL, T3FREE, THYROIDAB in the last 72 hours.  Invalid input(s): FREET3 Anemia work up No results for input(s): VITAMINB12, FOLATE, FERRITIN, TIBC, IRON , RETICCTPCT in the last 72 hours. Urinalysis    Component Value Date/Time   COLORURINE YELLOW (A) 09/19/2022 0509   APPEARANCEUR HAZY (A) 09/19/2022 0509   APPEARANCEUR Clear 05/22/2014 2223   LABSPEC 1.011 09/19/2022 0509   LABSPEC 1.024 05/22/2014 2223   PHURINE 7.0 09/19/2022 0509   GLUCOSEU NEGATIVE 09/19/2022 0509   GLUCOSEU Negative 05/22/2014 2223   HGBUR LARGE (A) 09/19/2022 0509   BILIRUBINUR negative 01/28/2023 1012   BILIRUBINUR Negative 05/22/2014 2223   KETONESUR 5 (A) 09/19/2022 0509   PROTEINUR Negative 01/28/2023 1012   PROTEINUR 30 (A) 09/19/2022 0509   UROBILINOGEN 0.2 01/28/2023 1012   NITRITE positive 01/28/2023 1012   NITRITE NEGATIVE 09/19/2022 0509   LEUKOCYTESUR Moderate (2+) (A) 01/28/2023 1012    LEUKOCYTESUR SMALL (A) 09/19/2022 0509   LEUKOCYTESUR Trace 05/22/2014 2223   Sepsis Labs Recent Labs  Lab 05/10/24 1259 05/14/24 1438 05/15/24 0415  WBC 9.1 7.6 5.4   Microbiology No results found for this or any previous visit (from the past 240 hours).   Time coordinating discharge: 40 minutes  SIGNED:   Calvin KATHEE Robson, MD  [facility] 05/16/2024, 12:24 PM Pager   If 7PM-7AM, please contact night-coverage     [1]  Allergies Allergen Reactions   Amoxicillin Anaphylaxis   Cephalosporins Swelling and Rash    Lip swelling and rash on CTX and Vancomycin .   Naproxen Hives, Shortness Of Breath, Nausea And Vomiting and Swelling   Penicillins Shortness Of Breath, Swelling and Rash    Tolerated ceftriaxone  08/2022   Shellfish Allergy Diarrhea, Hives, Nausea And Vomiting, Swelling, Nausea Only and Other (See Comments)    Other Reaction(s): GI Intolerance   Vancomycin  Rash, Swelling, Dermatitis and Other (See Comments)    Occurred while on CTX and Vancomycin . More likely CTX as she has anaphylaxis to PCN, but should monitor closely.  Other Reaction(s): Angioedema, Facial Edema (intolerance)    Occurred while on CTX and Vancomycin . More likely CTX as she has anaphylaxis to PCN, but should monitor closely.   Codeine Nausea And Vomiting and Nausea Only    Other Reaction(s): GI Intolerance   Levofloxacin Nausea And Vomiting    Other Reaction(s): GI Intolerance

## 2024-05-16 NOTE — Discharge Instructions (Signed)
 Home Health services were ordered for physical therapy and occupational therapy. [facility] was the last provider in the home. Services will begin 24 to 48 hours after discharge.

## 2024-05-17 ENCOUNTER — Telehealth: Payer: Self-pay

## 2024-05-17 NOTE — Patient Instructions (Signed)
 Visit Information  Thank you for taking time to visit with me today. Please don't hesitate to contact me if I can be of assistance to you before our next scheduled telephone appointment.  Our next appointment is by telephone on 05/22/24 at 11:00 AM  Following is a copy of your care plan:   Goals Addressed             This Visit's Progress    [facility] Transitions of Care (TOC) Care Plan       Problems:  Recent Hospitalization for treatment of CHF Equipment/DME barrier scale, Knowledge Deficit Related to reason for BP and weights daily at the same time reviewed, and No Hospital Follow Up Provider appointment patient desires to keep appointment for 06/11/24 and offered to assist with getting a closer appointment however adamantly declines at this time  Goal:  Over the next 30 days, the patient will not experience hospital readmission  Interventions:   Heart Failure Interventions: Provided education on low sodium diet Assessed need for readable accurate scales in home Provided education about placing scale on hard, flat surface Advised patient to weigh each morning after emptying bladder Discussed importance of daily weight and advised patient to weigh and record daily Reviewed role of diuretics in prevention of fluid overload and management of heart failure; Discussed the importance of keeping all appointments with provider Advised patient to discuss Potassium Chloride  prescription due to inability to keep in down and unable to swallow - patient states she would discuss with provider Screening for signs and symptoms of depression related to chronic disease state  Assessed social determinant of health barriers   Patient Self Care Activities:  Attend all scheduled provider appointments Call pharmacy for medication refills 3-7 days in advance of running out of medications Call provider office for new concerns or questions  Notify RN Care Manager of TOC call rescheduling  needs Participate in Transition of Care Program/Attend TOC scheduled calls Take medications as prescribed    Plan:  The patient has been provided with contact information for the care management team and has been advised to call with any health related questions or concerns.  Discussed and offered 30 day TOC program.  Patient agrees to weekly telephonic calls.  The patient has been provided with contact information for the care management team and has been advised to call with any health -related questions or concerns.  The patient verbalized understanding with current plan of care.  The patient is directed to their insurance card regarding availability of benefits coverage.          Patient verbalizes understanding of instructions and care plan provided today and agrees to view in MyChart. Active MyChart status and patient understanding of how to access instructions and care plan via MyChart confirmed with patient.     Telephone follow up appointment with care management team member scheduled for: The patient has been provided with contact information for the care management team and has been advised to call with any health related questions or concerns.   Please call the care guide team at [phone] if you need to cancel or reschedule your appointment.   Please call the [place]  National Suicide Prevention Lifeline: 512-553-9815 or TTY: (786)327-8560 TTY (985) 792-0197) to talk to a trained counselor if you are experiencing a Mental Health or Behavioral Health Crisis or need someone to talk to.  Richerd Fish, RN, BSN, CCM [facility], [facility] Management Coordinator Direct Dial: [phone]

## 2024-05-17 NOTE — Transitions of Care (Post Inpatient/ED Visit) (Signed)
 05/17/2024  Name: Misty Page MRN: [id number] DOB: Aug 14, 1940  Today's TOC FU Call Status: Today's TOC FU Call Status:: Successful TOC FU Call Completed TOC FU Call Complete Date: 05/17/24  Patient's Name and Date of Birth confirmed. Name, DOB  Transition Care Management Follow-up Telephone Call Date of Discharge: 05/16/24 Discharge Facility: [facility]) Type of Discharge: Inpatient Admission Primary Inpatient Discharge Diagnosis:: Acute on Chronic Heart Failure How have you been since you were released from the hospital?: Better Any questions or concerns?: No  Items Reviewed: Did you receive and understand the discharge instructions provided?: Yes (They went over everything thoroughly with me and my daughter) Medications obtained,verified, and reconciled?: Yes (Medications Reviewed) Any new allergies since your discharge?: No Dietary orders reviewed?: Yes Type of Diet Ordered:: Heart Healthy Low sodium - fluid restriction Do you have support at home?: Yes People in Home [RPT]: alone Name of Support/Comfort Primary Source: daughter - Misty Page  and hired caregivers MWF (private pay)  Medications Reviewed Today: Medications Reviewed Today     Reviewed by Misty Misty GRADE, RN (Registered Nurse) on 05/17/24 at [phone]  Med List Status: <None>   Medication Order Taking? Sig Documenting Provider Last Dose Status Informant  acetaminophen  (TYLENOL ) 500 MG tablet [id number]  Take 500-1,000 mg by mouth every 8 (eight) hours as needed.  Patient not taking: Reported on 05/17/2024   [provider]  Active Child, Pharmacy Records           Med Note [place], Misty Page Schaumann May 17, 2024  9:20 AM) Has availlable  amLODipine  (NORVASC ) 5 MG tablet [id number] Yes Take 1 tablet (5 mg total) by mouth daily. Misty [name], MD  Active Child  apixaban  (ELIQUIS ) 5 MG TABS tablet [id number] Yes Take 1 tablet (5 mg total) by mouth 2 (two) times daily.  Misty [name], MD  Active Child  Ascorbic Acid  (VITAMIN C ) 1000 MG tablet [id number] Yes Take 1 tablet (1,000 mg total) by mouth every morning. Misty [name], MD  Active Child  Bacillus Coagulans-Inulin (PROBIOTIC-PREBIOTIC) 1-250 BILLION-MG CAPS [id number] Yes Take 1 capsule by mouth daily. Misty [name], MD  Active Child  Calcium  Carb-Cholecalciferol  (CALCIUM +D3) 600-20 MG-MCG TABS [id number] Yes Take 1 tablet by mouth daily. Misty [name], MD  Active Child  ezetimibe  (ZETIA ) 10 MG tablet [id number] Yes Take 1 tablet (10 mg total) by mouth daily. Misty [name], MD  Active Child  famotidine  (PEPCID ) 20 MG tablet [id number] Yes Take 1 tablet (20 mg total) by mouth 2 (two) times daily. Misty [name], MD  Active Child  furosemide  (LASIX ) 20 MG tablet [id number] Yes Take 1 tablet (20 mg total) by mouth daily. Misty Calvin NOVAK, MD  Active   gabapentin  (NEURONTIN ) 300 MG capsule [id number] Yes Take 1 capsule (300 mg total) by mouth 2 (two) times daily. Misty [name], MD  Active Child  hydrALAZINE  (APRESOLINE ) 50 MG tablet [id number] Yes Take 1 tablet (50 mg total) by mouth 3 (three) times daily. Take 1 tablet (50 mg total) by mouth 3 (three) times daily. Misty Page, Misty M, MD  Active Child  losartan  (COZAAR ) 100 MG tablet [id number] Yes Take 1 tablet (100 mg total) by mouth daily. Misty [name], MD  Active Child  Mesalamine  800 MG TBEC [id number] Yes Take 1 tablet (800 mg total) by mouth in the morning, at noon, and at bedtime. Misty [name], MD  Active Child  methimazole  (TAPAZOLE ) 5 MG tablet [id number] Yes  1 Tablet on Mondays, Wednesdays and Fridays and take 1/2 tablet daily the rest of the week. Misty [name], MD  Active Child  potassium chloride  SA (KLOR-CON  Page) 10 MEQ tablet [id number]  Take 1 tablet (10 mEq total) by mouth 2 (two) times daily.  Patient not taking: Reported on 05/17/2024   Misty Calvin NOVAK, MD  Active   rosuvastatin   (CRESTOR ) 10 MG tablet [id number] Yes Take 1 tablet (10 mg total) by mouth daily. Misty [name], MD  Active Child            Home Care and Equipment/Supplies: Were Home Health Services Ordered?: Yes Name of Home Health Agency:: [facility] Has Agency set up a time to come to your home?: No EMR reviewed for Home Health Orders: Orders present/patient has not received call (refer to CM for follow-up) Any new equipment or medical supplies ordered?: No  Functional Questionnaire: Do you need assistance with bathing/showering or dressing?: No Do you need assistance with meal preparation?: No Do you need assistance with eating?: No Do you have difficulty maintaining continence: No Do you need assistance with getting out of bed/getting out of a chair/moving?: No Do you have difficulty managing or taking your medications?: Yes (My medications comes in a bubble pack from the pharmacy)  Follow up appointments reviewed: PCP Follow-up appointment confirmed?: Yes Date of PCP follow-up appointment?: 06/11/24 (Explained to patient regarding AVS suggested hospital follow up within 1- 2 weeks. Patient adament about keeping appointment already scheduled.) Follow-up Provider: [name], MD Specialist Hospital Follow-up appointment confirmed?: Yes Date of Specialist follow-up appointment?: 06/06/24 Follow-Up Specialty Provider:: Misty Deatrice LABOR, MD in [facility] Do you need transportation to your follow-up appointment?: No (I schedule things with my daughter and she gets me there) Do you understand care options if your condition(s) worsen?: Yes-patient verbalized understanding  SDOH Interventions Today    Flowsheet Row Most Recent Value  SDOH Interventions   Food Insecurity Interventions Intervention Not Indicated  Housing Interventions Intervention Not Indicated  Transportation Interventions Intervention Not Indicated, Patient Resources (Friends/Family)  Utilities  Interventions Intervention Not Indicated    Goals Addressed             This Visit's Progress    VBCI Transitions of Care (TOC) Care Plan       Problems:  Recent Hospitalization for treatment of CHF Equipment/DME barrier scale, Knowledge Deficit Related to reason for BP and weights daily at the same time reviewed, and No Hospital Follow Up Provider appointment patient desires to keep appointment for 06/11/24 and offered to assist with getting a closer appointment however adamantly declines at this time  Goal:  Over the next 30 days, the patient will not experience hospital readmission  Interventions:   Heart Failure Interventions: Provided education on low sodium diet Assessed need for readable accurate scales in home Provided education about placing scale on hard, flat surface Advised patient to weigh each morning after emptying bladder Discussed importance of daily weight and advised patient to weigh and record daily Reviewed role of diuretics in prevention of fluid overload and management of heart failure; Discussed the importance of keeping all appointments with provider Advised patient to discuss Potassium Chloride  prescription due to inability to keep in down and unable to swallow - patient states she would discuss with provider Screening for signs and symptoms of depression related to chronic disease state  Assessed social determinant of health barriers   Patient Self Care Activities:  Attend all scheduled provider appointments Call pharmacy  for medication refills 3-7 days in advance of running out of medications Call provider office for new concerns or questions  Notify RN Care Manager of TOC call rescheduling needs Participate in Transition of Care Program/Attend TOC scheduled calls Take medications as prescribed    Plan:  The patient has been provided with contact information for the care management team and has been advised to call with any health related questions  or concerns.  Discussed and offered 30 day TOC program.  Patient agrees to weekly telephonic calls.  The patient has been provided with contact information for the care management team and has been advised to call with any health -related questions or concerns.  The patient verbalized understanding with current plan of care.  The patient is directed to their insurance card regarding availability of benefits coverage.          Misty Fish, RN, BSN, CCM [facility] Management Coordinator Direct Dial: [phone]

## 2024-05-22 ENCOUNTER — Telehealth: Payer: Self-pay

## 2024-05-22 ENCOUNTER — Other Ambulatory Visit: Payer: Self-pay

## 2024-05-22 ENCOUNTER — Other Ambulatory Visit: Payer: Self-pay | Admitting: Family Medicine

## 2024-05-22 DIAGNOSIS — I25118 Atherosclerotic heart disease of native coronary artery with other forms of angina pectoris: Secondary | ICD-10-CM

## 2024-05-22 DIAGNOSIS — R112 Nausea with vomiting, unspecified: Secondary | ICD-10-CM

## 2024-05-22 NOTE — Transitions of Care (Post Inpatient/ED Visit) (Signed)
 " Transition of Care week 2  Visit Note  05/22/2024  Name: Misty Page MRN: [id number]          DOB: Sep 06, 1940  Situation: Patient enrolled in [facility] 30-day program. Visit completed with patient by telephone.   Background:   Initial Transition Care Management Follow-up Telephone Call Discharge Date and Diagnosis: 05/16/24, Acute on Chronic Heart Failure   Past Medical History:  Diagnosis Date   Allergy    Anemia    Arthritis    CA of skin 10/15/2014   Calculus of kidney 10/15/2014   Seen in ER 06/16/10.    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   Coronary artery disease    Diverticulitis    Environmental and seasonal allergies    Fibrocystic breast disease (FCBD)    GERD (gastroesophageal reflux disease)    Heart murmur    High cholesterol    History of heart artery stent    History of kidney stones    Hyperlipidemia    Hypertension    Hyperthyroidism    Myocardial infarction [facility])    Osteomyelitis of left femur (HCC) 11/30/2018   Osteoporosis    PONV (postoperative nausea and vomiting)    Reactive depression 02/25/2019   Thyroid  disease    TIA (transient ischemic attack)     Assessment: Patient Reported Symptoms: Cognitive Cognitive Status: Able to follow simple commands, Alert and oriented to person, place, and time, No symptoms reported, Insightful and able to interpret abstract concepts, Normal speech and language skills      Neurological Neurological Review of Symptoms: No symptoms reported Neurological Management Strategies: Medication therapy, Routine screening Neurological Self-Management Outcome: 4 (good)  HEENT HEENT Symptoms Reported: No symptoms reported      Cardiovascular Cardiovascular Symptoms Reported: No symptoms reported Does patient have uncontrolled Hypertension?: Yes Is patient checking Blood Pressure at home?: Yes Patient's Recent BP reading at home: aide checks it but  awaiting to get her own BP cuff BP 150's/ 80's Cardiovascular Management Strategies: Medication therapy, Routine screening Weight:  (Waiting to get scale after holiday because [facility] didn't have one when she went last week)  Respiratory Respiratory Symptoms Reported: No symptoms reported Other Respiratory Symptoms: all has cleared up    Endocrine Endocrine Symptoms Reported: No symptoms reported    Gastrointestinal Gastrointestinal Symptoms Reported: No symptoms reported Additional Gastrointestinal Details: states, normal BMs      Genitourinary Genitourinary Symptoms Reported: No symptoms reported Genitourinary Management Strategies: Fluid modification, Incontinence garment/pad, Medication therapy Genitourinary Self-Management Outcome: 4 (good)  Integumentary   Skin Management Strategies: Activity, Adequate rest, Medication therapy, Routine screening Skin Self-Management Outcome: 4 (good)  Musculoskeletal Musculoskelatal Symptoms Reviewed: Difficulty walking, Other Other Musculoskeletal Symptoms: Left Aka - all the way up Musculoskeletal Management Strategies: Activity, Adequate rest, Medical device, Medication therapy, Routine screening Musculoskeletal Self-Management Outcome: 4 (good)      Psychosocial Psychosocial Symptoms Reported: No symptoms reported         Today's Vitals   Pain Scale: 0-10 Pain Score: 0-No pain Pain Type: Chronic pain, Phantom pain  Medications Reviewed Today     Reviewed by Eilleen Richerd GRADE, RN (Registered Nurse) on 05/22/24 at 1107  Med List Status: <None>   Medication Order Taking? Sig Documenting Provider Last Dose Status Informant  acetaminophen  (TYLENOL ) 500 MG tablet [id number]  Take 500-1,000 mg by mouth every 8 (eight) hours as needed.  Patient not taking: Reported on 05/17/2024  [provider]  Active Child, Pharmacy Records           Med Note [place], RICHERD CINDERELLA Schaumann May 17, 2024  9:20 AM) Has availlable  amLODipine   (NORVASC ) 5 MG tablet [id number] Yes Take 1 tablet (5 mg total) by mouth daily. [name], MD  Active Child  apixaban  (ELIQUIS ) 5 MG TABS tablet [id number] Yes Take 1 tablet (5 mg total) by mouth 2 (two) times daily. [name], MD  Active Child  Ascorbic Acid  (VITAMIN C ) 1000 MG tablet [id number] Yes Take 1 tablet (1,000 mg total) by mouth every morning. [name], MD  Active Child  Bacillus Coagulans-Inulin (PROBIOTIC-PREBIOTIC) 1-250 BILLION-MG CAPS [id number] Yes Take 1 capsule by mouth daily. [name], MD  Active Child  Calcium  Carb-Cholecalciferol  (CALCIUM +D3) 600-20 MG-MCG TABS [id number] Yes Take 1 tablet by mouth daily. [name], MD  Active Child  ezetimibe  (ZETIA ) 10 MG tablet [id number] Yes Take 1 tablet (10 mg total) by mouth daily. [name], MD  Active Child  famotidine  (PEPCID ) 20 MG tablet [id number] Yes Take 1 tablet (20 mg total) by mouth 2 (two) times daily. [name], MD  Active Child  furosemide  (LASIX ) 20 MG tablet [id number] Yes Take 1 tablet (20 mg total) by mouth daily. Jhonny Calvin NOVAK, MD  Active   gabapentin  (NEURONTIN ) 300 MG capsule [id number] Yes Take 1 capsule (300 mg total) by mouth 2 (two) times daily. [name], MD  Active Child  hydrALAZINE  (APRESOLINE ) 50 MG tablet [id number] Yes Take 1 tablet (50 mg total) by mouth 3 (three) times daily. Take 1 tablet (50 mg total) by mouth 3 (three) times daily. [name], MD  Active Child  losartan  (COZAAR ) 100 MG tablet [id number] Yes Take 1 tablet (100 mg total) by mouth daily. [name], MD  Active Child  Mesalamine  800 MG TBEC [id number] Yes Take 1 tablet (800 mg total) by mouth in the morning, at noon, and at bedtime. [name], MD  Active Child  methimazole  (TAPAZOLE ) 5 MG tablet [id number] Yes 1 Tablet on Mondays, Wednesdays and Fridays and take 1/2 tablet daily the rest of the week. [name], MD   Active Child  potassium chloride  SA (KLOR-CON  M) 10 MEQ tablet [id number]  Take 1 tablet (10 mEq total) by mouth 2 (two) times daily.  Patient not taking: Reported on 05/22/2024   Jhonny Calvin NOVAK, MD  Active   rosuvastatin  (CRESTOR ) 10 MG tablet [id number] Yes Take 1 tablet (10 mg total) by mouth daily. [name], MD  Active Child            Recommendation:   Follow up with PCP regarding potassium chloride  via in basket to FYI about if there is other easier swallowing possibilities. [facility]. Continue Current Plan of Care  Follow Up Plan:   Telephone follow-up Patient request after New Years our appointment is set for 06/04/24 at 11 AM  Richerd Fish, RN, BSN, CCM [facility], [facility] Management Coordinator Direct Dial: [phone]           "

## 2024-05-29 ENCOUNTER — Telehealth: Payer: Self-pay

## 2024-05-29 MED ORDER — POTASSIUM CHLORIDE 20 MEQ PO PACK: 20.0000 meq | PACK | Freq: Every day | ORAL | 3 refills | Status: DC

## 2024-05-29 NOTE — Addendum Note (Signed)
 Addended by: Cristo Ausburn M on: 05/29/2024 04:50 PM   Modules accepted: Orders

## 2024-05-29 NOTE — Telephone Encounter (Signed)
 OK for verbals.  Saw note from RN about her potassium too and they said she wanted to wait to discuss at her upcoming visit with me. Can you confirm with patient if she wants me to go ahead and send in a powder instead of the tab or try to find a smaller pill or if she is taking and waiting for our appt?

## 2024-05-29 NOTE — Telephone Encounter (Signed)
 Spoke with Harlene, RN and gave verbal order approval.   Called and spoke with patient, states she did want to discuss this at her OV, but will be okay with trying a powder or possibly a smaller pill if able. States the one she has now will gag her when she swallows it, causing her to throw it right back up.

## 2024-05-29 NOTE — Telephone Encounter (Signed)
 Copied from CRM #[id number]. Topic: Clinical - Home Health Verbal Orders >> May 29, 2024 10:43 AM Deaijah H wrote: Caller/Agency: Harlene Lenis PT w/ [facility] Callback Number: [phone] (secure) Service Requested: Physical Therapy Frequency: 1x for 1wk / 2x for 2wk / 1x for 2wk / 1 every 2wk 4 Any new concerns about the patient? Yes, potassium pill is too big for her to swallow (hasn't been taking) needs something easier

## 2024-05-30 ENCOUNTER — Telehealth: Payer: Self-pay

## 2024-05-30 NOTE — Telephone Encounter (Signed)
 Copied from CRM [id number]. Topic: Clinical - Prescription Issue >> May 30, 2024 12:49 PM Avram MATSU wrote: Reason for CRM: Dawn stated patient is on potassium chloride  (KLOR-CON ) 20 MEQ packet [[id number]] once a day and also on the tablets twice a day. The pharmacy can not supply the packet and need a new order for tablets.  Please advise [phone]

## 2024-06-01 ENCOUNTER — Other Ambulatory Visit: Payer: Self-pay | Admitting: Family Medicine

## 2024-06-01 DIAGNOSIS — E876 Hypokalemia: Secondary | ICD-10-CM

## 2024-06-01 MED ORDER — POTASSIUM CHLORIDE CRYS ER 10 MEQ PO TBCR: 10.0000 meq | EXTENDED_RELEASE_TABLET | Freq: Two times a day (BID) | ORAL | 0 refills | Status: DC

## 2024-06-01 NOTE — Telephone Encounter (Signed)
 10 meq tabs sent to pharmacy until patient able to follow up with PCP on 06/11/24

## 2024-06-04 ENCOUNTER — Other Ambulatory Visit: Payer: Self-pay

## 2024-06-04 ENCOUNTER — Telehealth: Payer: Self-pay

## 2024-06-04 NOTE — Transitions of Care (Post Inpatient/ED Visit) (Signed)
 " Transition of Care week 3  Visit Note  06/04/2024  Name: Misty Page MRN: [id number]          DOB: 02-25-1941  Situation: Patient enrolled in [facility] 30-day program. Visit completed with patient by telephone.   Background:   Initial Transition Care Management Follow-up Telephone Call Discharge Date and Diagnosis: 05/16/24, Acute on Chronic Heart Failure   Past Medical History:  Diagnosis Date   Allergy    Anemia    Arthritis    CA of skin 10/15/2014   Calculus of kidney 10/15/2014   Seen in ER 06/16/10.    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   Coronary artery disease    Diverticulitis    Environmental and seasonal allergies    Fibrocystic breast disease (FCBD)    GERD (gastroesophageal reflux disease)    Heart murmur    High cholesterol    History of heart artery stent    History of kidney stones    Hyperlipidemia    Hypertension    Hyperthyroidism    Myocardial infarction [facility])    Osteomyelitis of left femur (HCC) 11/30/2018   Osteoporosis    PONV (postoperative nausea and vomiting)    Reactive depression 02/25/2019   Thyroid  disease    TIA (transient ischemic attack)     Assessment: Patient Reported Symptoms: Cognitive Cognitive Status: No symptoms reported, Able to follow simple commands, Alert and oriented to person, place, and time, Insightful and able to interpret abstract concepts, Normal speech and language skills      Neurological Neurological Review of Symptoms: No symptoms reported Neurological Management Strategies: Medication therapy, Routine screening Neurological Self-Management Outcome: 4 (good)  HEENT HEENT Symptoms Reported: No symptoms reported      Cardiovascular Cardiovascular Symptoms Reported: No symptoms reported Does patient have uncontrolled Hypertension?: Yes Is patient checking Blood Pressure at home?: No Patient's Recent BP reading at home: I don't check it but  my aide does at times Cardiovascular Management Strategies: Medication therapy, Routine screening Weight: 115 lb (52.2 kg) (has scale now but not sure of accuracy, will check when HHPT/OT comes out) Cardiovascular Self-Management Outcome: 4 (good)  Respiratory Respiratory Symptoms Reported: No symptoms reported Other Respiratory Symptoms: remains all cleared up Respiratory Management Strategies: Routine screening, Medication therapy Respiratory Self-Management Outcome: 4 (good)  Endocrine Endocrine Symptoms Reported: No symptoms reported    Gastrointestinal Gastrointestinal Symptoms Reported: No symptoms reported      Genitourinary Genitourinary Symptoms Reported: No symptoms reported Genitourinary Management Strategies: Incontinence garment/pad, Medication therapy (on diuretic)  Integumentary Integumentary Symptoms Reported: No symptoms reported Skin Management Strategies: Activity, Adequate rest, Coping strategies, Medical device, Routine screening Skin Self-Management Outcome: 4 (good)  Musculoskeletal Musculoskelatal Symptoms Reviewed: Limited mobility, Weakness Other Musculoskeletal Symptoms: Left AKA Musculoskeletal Management Strategies: Activity, Medical device, Routine screening (Awaiting HHPT/OT seen nurse) Musculoskeletal Self-Management Outcome: 4 (good)      Psychosocial Psychosocial Symptoms Reported: No symptoms reported (Was at daughter's during the holidays and back home now and glad to be home... it was great being with family but good to be back to my routine.)         Today's Vitals   06/04/24 1105  Weight: 115 lb (52.2 kg)   Pain Scale: 0-10 Pain Score: 0-No pain Pain Type: Phantom pain Pain Location: Leg Pain Orientation: Left Pain Descriptors / Indicators: Nagging  Medications Reviewed Today     Reviewed by Eilleen Richerd GRADE,  RN (Registered Nurse) on 06/04/24 at 1132  Med List Status: <None>   Medication Order Taking? Sig Documenting Provider  Last Dose Status Informant  acetaminophen  (TYLENOL ) 500 MG tablet [id number]  Take 500-1,000 mg by mouth every 8 (eight) hours as needed.  Patient not taking: Reported on 05/17/2024   [provider]  Active Child, Pharmacy Records           Med Note [place], RICHERD CINDERELLA Schaumann May 17, 2024  9:20 AM) Has availlable  amLODipine  (NORVASC ) 5 MG tablet [id number] Yes Take 1 tablet (5 mg total) by mouth daily. [name], MD  Active Child  apixaban  (ELIQUIS ) 5 MG TABS tablet [id number] Yes Take 1 tablet (5 mg total) by mouth 2 (two) times daily. [name], MD  Active Child  Ascorbic Acid  (VITAMIN C ) 1000 MG tablet [id number] Yes Take 1 tablet (1,000 mg total) by mouth every morning. [name], MD  Active Child  Bacillus Coagulans-Inulin (PROBIOTIC-PREBIOTIC) 1-250 BILLION-MG CAPS [id number] Yes Take 1 capsule by mouth daily. [name], MD  Active Child  Calcium  Carb-Cholecalciferol  (CALCIUM +D3) 600-20 MG-MCG TABS [id number] Yes Take 1 tablet by mouth daily. [name], MD  Active Child  ezetimibe  (ZETIA ) 10 MG tablet [id number] Yes Take 1 tablet (10 mg total) by mouth daily. [name], MD  Active Child  famotidine  (PEPCID ) 20 MG tablet [id number] Yes Take 1 tablet (20 mg total) by mouth 2 (two) times daily. [name], MD  Active Child  furosemide  (LASIX ) 20 MG tablet [id number] Yes Take 1 tablet (20 mg total) by mouth daily. Jhonny Calvin NOVAK, MD  Active   gabapentin  (NEURONTIN ) 300 MG capsule [id number] Yes Take 1 capsule (300 mg total) by mouth 2 (two) times daily. [name], MD  Active Child  hydrALAZINE  (APRESOLINE ) 50 MG tablet [id number] Yes TAKE ONE TABLET BY MOUTH THREE TIMES A DAY [name], [name], MD  Active   losartan  (COZAAR ) 100 MG tablet [id number] Yes Take 1 tablet (100 mg total) by mouth daily. [name], MD  Active Child  Mesalamine  800 MG TBEC [id number] Yes Take 1 tablet (800 mg total) by  mouth in the morning, at noon, and at bedtime. [name], MD  Active Child  methimazole  (TAPAZOLE ) 5 MG tablet [id number] Yes 1 Tablet on Mondays, Wednesdays and Fridays and take 1/2 tablet daily the rest of the week. [name], Angela M, MD  Active Child  potassium chloride  (KLOR-CON  M) 10 MEQ tablet [id number]  Take 1 tablet (10 mEq total) by mouth 2 (two) times daily.  Patient not taking: Reported on 06/04/2024   Simmons-Robinson, Rockie, MD  Active   rosuvastatin  (CRESTOR ) 10 MG tablet [id number] Yes Take 1 tablet (10 mg total) by mouth daily. [name], MD  Active Child            Recommendation:   Continue Current Plan of Care  Follow Up Plan:   Telephone follow-up in 1 week  Richerd Fish, RN, BSN, CCM [facility] Management Coordinator Direct Dial: [phone]           "

## 2024-06-04 NOTE — Progress Notes (Signed)
 Complex Care Management Note  Care Guide Note 06/04/2024 Name: AMNEET CENDEJAS MRN: [id number] DOB: 02-07-1941  ELVIA AYDIN is a 84 y.o. year old female who sees [name], [name], MD for primary care. I reached out to Niels LELON Dawn by phone today to offer complex care management services.  Ms. Gorby was given information about Complex Care Management services today including:   The Complex Care Management services include support from the care team which includes your Nurse Care Manager, Clinical Social Worker, or Pharmacist.  The Complex Care Management team is here to help remove barriers to the health concerns and goals most important to you. Complex Care Management services are voluntary, and the patient may decline or stop services at any time by request to their care team member.   Complex Care Management Consent Status: Patient agreed to services and verbal consent obtained.   Follow up plan:  Telephone appointment with complex care management team member scheduled for:  06/14/24 at 3:00 p.m.   Encounter Outcome:  Patient Scheduled  Dreama [facility]  [facility], [facility] VBCI Assistant Direct Dial: [phone]  Fax: [phone]

## 2024-06-04 NOTE — Patient Instructions (Signed)
 Visit Information  Thank you for taking time to visit with me today. Please don't hesitate to contact me if I can be of assistance to you before our next scheduled telephone appointment.  Our next appointment is by telephone on 06/12/2024 at 11:00 AM  Following is a copy of your care plan:   Goals Addressed             This Visit's Progress    VBCI Transitions of Care (TOC) Care Plan   Improving    Problems:  Recent Hospitalization for treatment of CHF Equipment/DME barrier scale, Knowledge Deficit Related to reason for BP and weights daily at the same time reviewed, and No Hospital Follow Up Provider appointment patient desires to keep appointment for 06/11/24 and offered to assist with getting a closer appointment however adamantly declines at this time Patient is unable to take her Potassium Chloride , states pills are too big causing gagging to the point of vomiting, states, I will take with Dr. B about it in January and I'm not going to worry with it until then. 06/04/24 Ongoing follow up needed with Potassium pills, although pills changed to 10 mEq twice a day on 06/01/2024 patient still prefers to follow up with PCP on 06/11/24 or Cardiologist on 06/06/24 regarding not taking it and if needed still to have it in powdered form.   Goal:  Over the next 30 days, the patient will not experience hospital readmission  Interventions:   Heart Failure Interventions: Provided education on low sodium diet Assessed need for readable accurate scales in home Provided education about placing scale on hard, flat surface Advised patient to weigh each morning after emptying bladder Discussed importance of daily weight and advised patient to weigh and record daily Reviewed role of diuretics in prevention of fluid overload and management of heart failure; Discussed the importance of keeping all appointments with provider Advised patient to discuss Potassium Chloride  prescription due to inability to keep  in down and unable to swallow - patient states she would discuss with provider Screening for signs and symptoms of depression related to chronic disease state  Assessed social determinant of health barriers  05/22/24 Reviewed importance of potassium and patient wanted review of foods that assist with them, Reviewed heart heathy diet and foods that have good potassium and heart healthy such as sweet potatoes, zucchini, beans, lean proteins, reminded for low sodium as well over the holiday. 06/04/24 - Ongoing encouragement for monitoring for HF; states continues to follow up with providers but feels she is doing well right now.  Patient Self Care Activities:  Attend all scheduled provider appointments Call pharmacy for medication refills 3-7 days in advance of running out of medications Call provider office for new concerns or questions  Notify RN Care Manager of TOC call rescheduling needs Participate in Transition of Care Program/Attend TOC scheduled calls Take medications as prescribed    Plan:  The patient has been provided with contact information for the care management team and has been advised to call with any health related questions or concerns.  Discussed and offered 30 day TOC program.  Patient agrees to weekly telephonic calls.  The patient has been provided with contact information for the care management team and has been advised to call with any health -related questions or concerns.  The patient verbalized understanding with current plan of care.  The patient is directed to their insurance card regarding availability of benefits coverage.   05/22/24 Discussed patient's action plan for HF  and patient states she is spending the holidays with her daughter and wishes follow up after the new year. Discussed who to contact and take her providers information with her with the AVS or DC sheets as well.  Patient verbalized understanding using teach back method.   06/04/24 - Continue care plan -  next appointment next Tuesday 06/12/24 after upcoming appointments to track progress and needs.        Patient verbalizes understanding of instructions and care plan provided today and agrees to view in MyChart. Active MyChart status and patient understanding of how to access instructions and care plan via MyChart confirmed with patient.     The patient has been provided with contact information for the care management team and has been advised to call with any health related questions or concerns.   Please call the care guide team at [phone] if you need to cancel or reschedule your appointment.   Please call the [place]  National Suicide Prevention Lifeline: 587 801 6066 or TTY: 8035781094 TTY 763-509-7684) to talk to a trained counselor if you are experiencing a Mental Health or Behavioral Health Crisis or need someone to talk to.  Richerd Fish, RN, BSN, CCM [facility], [facility] Management Coordinator Direct Dial: [phone]

## 2024-06-06 ENCOUNTER — Encounter: Payer: Self-pay | Admitting: Cardiovascular Disease

## 2024-06-06 ENCOUNTER — Ambulatory Visit: Admitting: Cardiovascular Disease

## 2024-06-06 VITALS — BP 116/58 | HR 82 | Ht 60.0 in | Wt 120.4 lb

## 2024-06-06 DIAGNOSIS — I350 Nonrheumatic aortic (valve) stenosis: Secondary | ICD-10-CM

## 2024-06-06 DIAGNOSIS — I10 Essential (primary) hypertension: Secondary | ICD-10-CM | POA: Diagnosis not present

## 2024-06-06 DIAGNOSIS — E785 Hyperlipidemia, unspecified: Secondary | ICD-10-CM | POA: Diagnosis not present

## 2024-06-06 DIAGNOSIS — I2510 Atherosclerotic heart disease of native coronary artery without angina pectoris: Secondary | ICD-10-CM

## 2024-06-06 DIAGNOSIS — I5032 Chronic diastolic (congestive) heart failure: Secondary | ICD-10-CM

## 2024-06-06 NOTE — Patient Instructions (Signed)
 Medication Instructions:  No changes *If you need a refill on your cardiac medications before your next appointment, please call your pharmacy*  Lab Work: Your provider would like for you to have the following labs today: BMET  If you have labs (blood work) drawn today and your tests are completely normal, you will receive your results only by: MyChart Message (if you have MyChart) OR A paper copy in the mail If you have any lab test that is abnormal or we need to change your treatment, we will call you to review the results.  Testing/Procedures: None ordered  Follow-Up: At [facility], you and your health needs are our priority.  As part of our continuing mission to provide you with exceptional heart care, our providers are all part of one team.  This team includes your primary Cardiologist (physician) and Advanced Practice Providers or APPs (Physician Assistants and Nurse Practitioners) who all work together to provide you with the care you need, when you need it.  Your next appointment:   3 month(s)  Provider:   You may see Dr. Darron or one of the following Advanced Practice Providers on your designated Care Team:   Lonni Meager, NP Lesley Maffucci, [name] Bernardino Bring, [name] Cadence Providence, [name] Tylene Lunch, NP Barnie Hila, NP    We recommend signing up for the patient portal called MyChart.  Sign up information is provided on this After Visit Summary.  MyChart is used to connect with patients for Virtual Visits (Telemedicine).  Patients are able to view lab/test results, encounter notes, upcoming appointments, etc.  Non-urgent messages can be sent to your provider as well.   To learn more about what you can do with MyChart, go to [email or web address].

## 2024-06-06 NOTE — Progress Notes (Signed)
 "    Cardiology Office Note   Date:  06/06/2024   ID:  Misty Page, DOB 01/06/1941, MRN [id number]  PCP:  [name], MD  Cardiologist:   Deatrice Cage, MD   Chief Complaint  Patient presents with   New Patient (Initial Visit)    CHF. Meds reviewed verbally with pt.      History of Present Illness: Misty Page is a 84 y.o. female who was referred by Dr. Jhonny for evaluation of heart failure and aortic stenosis. 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 She was previously followed by [facility] cardiology but wants to establish with us .  She was diagnosed with pulmonary embolism in September 2025 and has been on anticoagulation with Eliquis .  She was hospitalized in December with worsening shortness of breath and was found to have heart failure in the setting of uncontrolled hypertension.  She improved with IV diuresis and was discharged on oral furosemide  20 mg daily.  Troponin was mildly elevated at 27 but flat and not consistent with acute coronary syndrome.  Hydralazine  was added for blood pressure control with subsequent improvement. She had an echocardiogram done in September which showed normal LV systolic function with indeterminate diastolic function, mild mitral regurgitation and moderate aortic stenosis.  Aortic valve mean gradient was 28 mmHg with a valve area of 1.41 cm. She has been doing well since hospital discharge with resolution of shortness of breath and lower extremity edema.  She has been taking furosemide  with no issues and her blood pressure has been controlled.   Past Medical History:  Diagnosis Date   Allergy    Anemia    Arthritis    CA of skin 10/15/2014   Calculus of kidney 10/15/2014   Seen in ER 06/16/10.    Colitis    Complication of anesthesia     nausea   Complication of internal prosthetic device 01/27/2009   Coronary artery disease    Diverticulitis    Environmental and seasonal allergies    Fibrocystic breast disease (FCBD)    GERD (gastroesophageal reflux disease)    Heart murmur    High cholesterol    History of heart artery stent    History of kidney stones    Hyperlipidemia    Hypertension    Hyperthyroidism    Myocardial infarction (HCC)    Osteomyelitis of left femur (HCC) 11/30/2018   Osteoporosis    PONV (postoperative nausea and vomiting)    Reactive depression 02/25/2019   Thyroid  disease    TIA (transient ischemic attack)     Past Surgical History:  Procedure Laterality Date   ABDOMINAL HYSTERECTOMY  2007   BREAST ENHANCEMENT SURGERY  2004   BREAST IMPLANT REMOVAL     BREAST SURGERY  1984   Fibrocystic Disease   CAROTID ENDARTERECTOMY Left 06/21/2014   Dr. Jama   CAROTID ENDARTERECTOMY Right 08/02/2014   Dr. Jama   CAROTID PTA/STENT INTERVENTION N/A 06/15/2017   Procedure: CAROTID PTA/STENT INTERVENTION;  Surgeon: Jama Cordella MATSU, MD;  Location: [facility] INVASIVE CV LAB;  Service: Cardiovascular;  Laterality: N/A;   COLONOSCOPY WITH PROPOFOL  N/A 07/14/2015   Procedure: COLONOSCOPY WITH PROPOFOL ;  Surgeon: Deward CINDERELLA Piedmont, MD;  Location: [facility] ENDOSCOPY;  Service: Gastroenterology;  Laterality: N/A;   COLONOSCOPY WITH PROPOFOL  N/A 02/08/2018   Procedure: COLONOSCOPY WITH PROPOFOL ;  Surgeon:  Toledo, Ladell POUR, MD;  Location: [facility] ENDOSCOPY;  Service: Gastroenterology;  Laterality: N/A;   CORONARY ANGIOPLASTY WITH STENT PLACEMENT  1999   CYSTOSCOPY WITH STENT PLACEMENT Right 09/20/2022   Procedure: CYSTOSCOPY WITH STENT PLACEMENT;  Surgeon: Penne Knee, MD;  Location: [facility] ORS;  Service: Urology;  Laterality: Right;   CYSTOSCOPY/URETEROSCOPY/HOLMIUM LASER/STENT PLACEMENT Right 10/07/2022   Procedure: CYSTOSCOPY/URETEROSCOPY/HOLMIUM LASER/STENT PLACEMENT;  Surgeon: Penne Knee, MD;  Location: [facility] ORS;   Service: Urology;  Laterality: Right;   ESOPHAGOGASTRODUODENOSCOPY (EGD) WITH PROPOFOL  N/A 02/08/2018   Procedure: ESOPHAGOGASTRODUODENOSCOPY (EGD) WITH PROPOFOL ;  Surgeon: Toledo, Ladell POUR, MD;  Location: [facility] ENDOSCOPY;  Service: Gastroenterology;  Laterality: N/A;   FEMORAL ARTERY STENT  08/2003   HERNIA REPAIR  2008   LEG AMPUTATION AT HIP Left 02/28/2019   MASTECTOMY     RENAL ARTERY STENT  08/2003   TRANSUMBILICAL AUGMENTATION MAMMAPLASTY     VASCULAR SURGERY     VISCERAL ARTERY INTERVENTION N/A 02/13/2020   Procedure: VISCERAL ARTERY INTERVENTION;  Surgeon: Marea Selinda RAMAN, MD;  Location: [facility] INVASIVE CV LAB;  Service: Cardiovascular;  Laterality: N/A;     Current Outpatient Medications  Medication Sig Dispense Refill   acetaminophen  (TYLENOL ) 500 MG tablet Take 500-1,000 mg by mouth every 8 (eight) hours as needed.     amLODipine  (NORVASC ) 5 MG tablet Take 1 tablet (5 mg total) by mouth daily. 90 tablet 3   apixaban  (ELIQUIS ) 5 MG TABS tablet Take 1 tablet (5 mg total) by mouth 2 (two) times daily. 180 tablet 1   Ascorbic Acid  (VITAMIN C ) 1000 MG tablet Take 1 tablet (1,000 mg total) by mouth every morning. 90 tablet 1   Bacillus Coagulans-Inulin (PROBIOTIC-PREBIOTIC) 1-250 BILLION-MG CAPS Take 1 capsule by mouth daily. 90 capsule 01   Calcium  Carb-Cholecalciferol  (CALCIUM +D3) 600-20 MG-MCG TABS Take 1 tablet by mouth daily. 90 tablet 1   ezetimibe  (ZETIA ) 10 MG tablet Take 1 tablet (10 mg total) by mouth daily. 90 tablet 1   famotidine  (PEPCID ) 20 MG tablet Take 1 tablet (20 mg total) by mouth 2 (two) times daily. 180 tablet 1   furosemide  (LASIX ) 20 MG tablet Take 1 tablet (20 mg total) by mouth daily. 30 tablet 0   gabapentin  (NEURONTIN ) 300 MG capsule Take 1 capsule (300 mg total) by mouth 2 (two) times daily. 180 capsule 3   hydrALAZINE  (APRESOLINE ) 50 MG tablet TAKE ONE TABLET BY MOUTH THREE TIMES A DAY 270 tablet 1   losartan  (COZAAR ) 100 MG tablet Take 1 tablet (100 mg total)  by mouth daily. 90 tablet 1   Mesalamine  800 MG TBEC Take 1 tablet (800 mg total) by mouth in the morning, at noon, and at bedtime. 270 tablet 1   methimazole  (TAPAZOLE ) 5 MG tablet 1 Tablet on Mondays, Wednesdays and Fridays and take 1/2 tablet daily the rest of the week. 60 tablet 3   rosuvastatin  (CRESTOR ) 10 MG tablet Take 1 tablet (10 mg total) by mouth daily. 90 tablet 1   potassium chloride  (KLOR-CON  M) 10 MEQ tablet Take 1 tablet (10 mEq total) by mouth 2 (two) times daily. (Patient not taking: Reported on 06/06/2024) 30 tablet 0   No current facility-administered medications for this visit.    Allergies:   Amoxicillin, Cephalosporins, Naproxen, Penicillins, Shellfish allergy, Vancomycin , Codeine, and Levofloxacin    Social History:  The patient  reports that she quit smoking about 30 years ago. Her smoking use included cigarettes. She has never used smokeless tobacco. She reports current alcohol  use. She reports that she does not use drugs.   Family History:  The patient's family history includes Alzheimer's disease in her sister; Arthritis in her sister; Breast cancer in her paternal grandmother; COPD in her mother; Congestive Heart Failure in her mother; Emphysema in her brother; Heart attack in her brother and maternal uncle; Heart disease in her mother; Hyperlipidemia in her mother; Hypertension in her mother and sister; Kidney failure in her brother; Leukemia in her brother and paternal grandfather; Osteoporosis in her mother; Pancreatic cancer in her brother and father; Prostate cancer in her brother; Stomach cancer in her brother and brother; Stroke in her maternal aunt.    ROS:  Please see the history of present illness.   Otherwise, review of systems are positive for none.   All other systems are reviewed and negative.    PHYSICAL EXAM: VS:  BP (!) 116/58 (BP Location: Left Arm, Patient Position: Sitting, Cuff Size: Normal)   Pulse 82   Ht 5' (1.524 m)   Wt 120 lb 6 oz (54.6  kg)   SpO2 98%   BMI 23.51 kg/m  , BMI Body mass index is 23.51 kg/m. GEN: Well nourished, well developed, in no acute distress  HEENT: normal  Neck: no JVD, carotid bruits, or masses Cardiac: RRR; no rubs, or gallops,no edema .  3 out of 6 systolic murmur in the aortic area which is mid-to-late peaking. Respiratory:  clear to auscultation bilaterally, normal work of breathing GI: soft, nontender, nondistended, + BS MS: Status post left AKA Skin: warm and dry, no rash Neuro:  Strength and sensation are intact Psych: euthymic mood, full affect   EKG:  EKG is ordered today. The ekg ordered today demonstrates :  Normal sinus rhythm RSR' or QR pattern in V1 suggests right ventricular conduction delay   Recent Labs: 05/10/2024: TSH 1.370 05/14/2024: ALT 10; Magnesium  2.0; Pro Brain Natriuretic Peptide 2,472.0 05/15/2024: BUN 17; Creatinine, Ser 0.63; Hemoglobin 7.1; Platelets 325; Potassium 4.0; Sodium 136    Lipid Panel    Component Value Date/Time   CHOL 166 09/09/2023 1117   TRIG 125 09/09/2023 1117   HDL 53 09/09/2023 1117   CHOLHDL 3.1 09/09/2023 1117   LDLCALC 91 09/09/2023 1117      Wt Readings from Last 3 Encounters:  06/06/24 120 lb 6 oz (54.6 kg)  06/04/24 115 lb (52.2 kg)  05/14/24 122 lb (55.3 kg)         06/06/2024   10:14 AM  PAD Screen  Previous PAD dx? Yes  Previous surgical procedure? Yes  Dates of procedures Left leg amputation, Cardiac cath  Pain with walking? No  Feet/toe relief with dangling? No  Painful, non-healing ulcers? No  Extremities discolored? No      ASSESSMENT AND PLAN:  1.  Chronic diastolic heart failure: She appears to be euvolemic on small dose furosemide .  Will check basic metabolic profile today.  We should consider switching hydralazine  to spironolactone and add an SGLT2 inhibitor.  2.  Aortic stenosis: This was moderate in September but murmur is suggestive of more advanced degree of stenosis.  Will plan on repeat  echocardiogram by June of this year.  3.  Pulmonary embolism: First episode: Currently on anticoagulation with Eliquis  but she is likely at high risk for DVT given limited mobility related to left AKA.  4.  Essential hypertension: Blood pressure was uncontrolled during her hospitalization but now well-controlled with amlodipine , losartan  and hydralazine .  5.  Hyperlipidemia: Currently on rosuvastatin .  6.  Coronary artery disease involving native coronary arteries without angina: Previous RCA PCI 1999.  No ischemic events since then.    Disposition:   FU with me in 3 months  Signed,  Deatrice Cage, MD  06/06/2024 10:32 AM    [facility] "

## 2024-06-07 ENCOUNTER — Ambulatory Visit: Payer: Self-pay | Admitting: *Deleted

## 2024-06-07 LAB — BASIC METABOLIC PANEL WITH GFR
BUN/Creatinine Ratio: 28 (ref 12–28)
BUN: 16 mg/dL (ref 8–27)
CO2: 24 mmol/L (ref 20–29)
Calcium: 9.3 mg/dL (ref 8.7–10.3)
Chloride: 99 mmol/L (ref 96–106)
Creatinine, Ser: 0.58 mg/dL (ref 0.57–1.00)
Glucose: 92 mg/dL (ref 70–99)
Potassium: 4.1 mmol/L (ref 3.5–5.2)
Sodium: 138 mmol/L (ref 134–144)
eGFR: 90 mL/min/1.73

## 2024-06-11 ENCOUNTER — Ambulatory Visit: Admitting: Family Medicine

## 2024-06-12 ENCOUNTER — Telehealth: Payer: Self-pay

## 2024-06-12 ENCOUNTER — Other Ambulatory Visit: Payer: Self-pay

## 2024-06-12 NOTE — Patient Instructions (Signed)
 Visit Information  Thank you for taking time to visit with me today. Please don't hesitate to contact me if I can be of assistance to you before our next scheduled telephone appointment.  Our next appointment is by telephone on 06/19/24 at 10:30   Following is a copy of your care plan:   Goals Addressed             This Visit's Progress    VBCI Transitions of Care (TOC) Care Plan       Problems:  Recent Hospitalization for treatment of CHF Equipment/DME barrier scale, Knowledge Deficit Related to reason for BP and weights daily at the same time reviewed, and No Hospital Follow Up Provider appointment patient desires to keep appointment for 06/11/24 and offered to assist with getting a closer appointment however adamantly declines at this time Patient is unable to take her Potassium Chloride , states pills are too big causing gagging to the point of vomiting, states, I will take with Dr. B about it in January and I'm not going to worry with it until then. 06/04/24 Ongoing follow up needed with Potassium pills, although pills changed to 10 mEq twice a day on 06/01/2024 patient still prefers to follow up with PCP on 06/11/24 or Cardiologist on 06/06/24 regarding not taking it and if needed still to have it in powdered form.  Follow up appointment cancelled by PCP until 07/02/24. Patient has an appointment with [facility] on 06/14/24 at 1500 reminded patient of appointment, for telephone appointment.  Goal:  Over the next 30 days, the patient will not experience hospital readmission  Interventions:   Heart Failure Interventions: Provided education on low sodium diet Assessed need for readable accurate scales in home Provided education about placing scale on hard, flat surface Advised patient to weigh each morning after emptying bladder Discussed importance of daily weight and advised patient to weigh and record daily Reviewed role of diuretics in prevention of fluid overload and  management of heart failure; Discussed the importance of keeping all appointments with provider Advised patient to discuss Potassium Chloride  prescription due to inability to keep in down and unable to swallow - patient states she would discuss with provider Screening for signs and symptoms of depression related to chronic disease state  Assessed social determinant of health barriers  05/22/24 Reviewed importance of potassium and patient wanted review of foods that assist with them, Reviewed heart heathy diet and foods that have good potassium and heart healthy such as sweet potatoes, zucchini, beans, lean proteins, reminded for low sodium as well over the holiday. 06/04/24 - Ongoing encouragement for monitoring for HF; states continues to follow up with providers but feels she is doing well right now. 06/12/24 Reviewed progress and care plan, patient verbalized not taking Potassium and to discuss with pharmacist on 06/14/24 for alternative stating her last potassium level was fine. Encouraged ongoing follow up and has labs for 06/20/24 appointment.  Patient Self Care Activities:  Attend all scheduled provider appointments Call pharmacy for medication refills 3-7 days in advance of running out of medications Call provider office for new concerns or questions  Notify RN Care Manager of TOC call rescheduling needs Participate in Transition of Care Program/Attend TOC scheduled calls Take medications as prescribed    Plan:  The patient has been provided with contact information for the care management team and has been advised to call with any health related questions or concerns.  Discussed and offered 30 day TOC program.  Patient agrees to  weekly telephonic calls.  The patient has been provided with contact information for the care management team and has been advised to call with any health -related questions or concerns.  The patient verbalized understanding with current plan of care.  The patient is  directed to their insurance card regarding availability of benefits coverage.   05/22/24 Discussed patient's action plan for HF and patient states she is spending the holidays with her daughter and wishes follow up after the new year. Discussed who to contact and take her providers information with her with the AVS or DC sheets as well.  Patient verbalized understanding using teach back method.   06/04/24 - Continue care plan - next appointment next Tuesday 06/12/24 after upcoming appointments to track progress and needs. 06/12/24 - Plan week 5 follow up, since PCP appointment is now 07/02/24         Patient verbalizes understanding of instructions and care plan provided today and agrees to view in MyChart. Active MyChart status and patient understanding of how to access instructions and care plan via MyChart confirmed with patient.     The patient has been provided with contact information for the care management team and has been advised to call with any health related questions or concerns.  The [facility] team will follow up with the patient and will provide direct communication to the PCP for this patient.   Please call the care guide team at [phone] if you need to cancel or reschedule your appointment.   Please call the [place]  National Suicide Prevention Lifeline: (606)359-4342 or TTY: (416)310-4231 TTY 5315646891) to talk to a trained counselor if you are experiencing a Mental Health or Behavioral Health Crisis or need someone to talk to.  Richerd Fish, RN, BSN, CCM [facility], [facility] Management Coordinator Direct Dial: [phone]

## 2024-06-12 NOTE — Transitions of Care (Post Inpatient/ED Visit) (Signed)
 " Transition of Care week 4  Visit Note  06/12/2024  Name: Misty Page MRN: [id number]          DOB: [birth year]  Situation: Patient enrolled in [facility] 30-day program. Visit completed with patient by telephone.   Background:   Initial Transition Care Management Follow-up Telephone Call Discharge Date and Diagnosis: 05/16/24, Acute on Chronic Heart Failure   Past Medical History:  Diagnosis Date   Allergy    Anemia    Arthritis    CA of skin 10/15/2014   Calculus of kidney 10/15/2014   Seen in ER 06/16/10.    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   Coronary artery disease    Diverticulitis    Environmental and seasonal allergies    Fibrocystic breast disease (FCBD)    GERD (gastroesophageal reflux disease)    Heart murmur    High cholesterol    History of heart artery stent    History of kidney stones    Hyperlipidemia    Hypertension    Hyperthyroidism    Myocardial infarction (HCC)    Osteomyelitis of left femur (HCC) 11/30/2018   Osteoporosis    PONV (postoperative nausea and vomiting)    Reactive depression 02/25/2019   Thyroid  disease    TIA (transient ischemic attack)     Assessment: Patient Reported Symptoms: Cognitive Cognitive Status: Able to follow simple commands, Alert and oriented to person, place, and time, No symptoms reported, Normal speech and language skills      Neurological Neurological Review of Symptoms: Weakness, Other: Oher Neurological Symptoms/Conditions [RPT]: HX of left AKA - phathom sensation Neurological Management Strategies: Medication therapy, Routine screening  HEENT HEENT Symptoms Reported: No symptoms reported HEENT Self-Management Outcome: 4 (good)    Cardiovascular Cardiovascular Symptoms Reported: No symptoms reported Does patient have uncontrolled Hypertension?: Yes Is patient checking Blood Pressure at home?: No Patient's Recent BP reading at home: I  don't check it but my aide does at times and Home Health checking on visits Cardiovascular Management Strategies: Medication therapy, Routine screening Weight: 118 lb (53.5 kg) Cardiovascular Self-Management Outcome: 4 (good)  Respiratory Respiratory Symptoms Reported: No symptoms reported Respiratory Management Strategies: Medication therapy, Routine screening  Endocrine Endocrine Symptoms Reported: No symptoms reported    Gastrointestinal Gastrointestinal Symptoms Reported: No symptoms reported Gastrointestinal Management Strategies: Medication therapy Gastrointestinal Self-Management Outcome: 4 (good)    Genitourinary Genitourinary Symptoms Reported: No symptoms reported Genitourinary Management Strategies: Incontinence garment/pad, Medication therapy Genitourinary Self-Management Outcome: 4 (good)  Integumentary Integumentary Symptoms Reported: Not assessed    Musculoskeletal Musculoskelatal Symptoms Reviewed: Limited mobility, Unsteady gait, Weakness Other Musculoskeletal Symptoms: Left AKA Musculoskeletal Management Strategies: Activity, Medical device, Medication therapy Musculoskeletal Self-Management Outcome: 4 (good) Musculoskeletal Comment: Ongoing with with HH with [facility] coming today again      Psychosocial Psychosocial Symptoms Reported: No symptoms reported         Today's Vitals   06/12/24 1205  Weight: 118 lb (53.5 kg)   Pain Scale: 0-10 Pain Score: 0-No pain Pain Type: Phantom pain  Medications Reviewed Today     Reviewed by Eilleen Richerd GRADE, RN (Registered Nurse) on 06/12/24 at 1254  Med List Status: <None>   Medication Order Taking? Sig Documenting Provider Last Dose Status Informant  acetaminophen  (TYLENOL ) 500 MG tablet [id number] Yes Take 500-1,000 mg by mouth every 8 (eight) hours as needed. [provider]  Active Child, Pharmacy Records  Med Note LESLY, RICHERD CINDERELLA Schaumann May 17, 2024  9:20 AM) Has availlable  amLODipine   (NORVASC ) 5 MG tablet [id number] Yes Take 1 tablet (5 mg total) by mouth daily. [name], MD  Active Child  apixaban  (ELIQUIS ) 5 MG TABS tablet [id number] Yes Take 1 tablet (5 mg total) by mouth 2 (two) times daily. [name], MD  Active Child  Ascorbic Acid  (VITAMIN C ) 1000 MG tablet [id number] Yes Take 1 tablet (1,000 mg total) by mouth every morning. [name], MD  Active Child  Bacillus Coagulans-Inulin (PROBIOTIC-PREBIOTIC) 1-250 BILLION-MG CAPS [id number] Yes Take 1 capsule by mouth daily. [name], MD  Active Child  Calcium  Carb-Cholecalciferol  (CALCIUM +D3) 600-20 MG-MCG TABS [id number] Yes Take 1 tablet by mouth daily. [name], MD  Active Child  ezetimibe  (ZETIA ) 10 MG tablet [id number] Yes Take 1 tablet (10 mg total) by mouth daily. [name], MD  Active Child  famotidine  (PEPCID ) 20 MG tablet [id number] Yes Take 1 tablet (20 mg total) by mouth 2 (two) times daily. [name], MD  Active Child  furosemide  (LASIX ) 20 MG tablet [id number] Yes Take 1 tablet (20 mg total) by mouth daily. Jhonny Calvin NOVAK, MD  Active   gabapentin  (NEURONTIN ) 300 MG capsule [id number] Yes Take 1 capsule (300 mg total) by mouth 2 (two) times daily. [name], MD  Active Child  hydrALAZINE  (APRESOLINE ) 50 MG tablet [id number] Yes TAKE ONE TABLET BY MOUTH THREE TIMES A DAY [name], [name], MD  Active   losartan  (COZAAR ) 100 MG tablet [id number] Yes Take 1 tablet (100 mg total) by mouth daily. [name], MD  Active Child  Mesalamine  800 MG TBEC [id number] Yes Take 1 tablet (800 mg total) by mouth in the morning, at noon, and at bedtime. [name], MD  Active Child  methimazole  (TAPAZOLE ) 5 MG tablet [id number] Yes 1 Tablet on Mondays, Wednesdays and Fridays and take 1/2 tablet daily the rest of the week. [name], Angela M, MD  Active Child  potassium chloride  (KLOR-CON  M) 10 MEQ tablet [id number]  Take 1 tablet  (10 mEq total) by mouth 2 (two) times daily.  Patient not taking: Reported on 06/06/2024   Sharma Coyer, MD  Active            Med Note LESLY, RICHERD CINDERELLA Debar Jun 12, 2024 12:54 PM) Patient has a central pharmacy appointment for 06/14/24 at 3 pm telephone noted.  rosuvastatin  (CRESTOR ) 10 MG tablet [id number] Yes Take 1 tablet (10 mg total) by mouth daily. [name], MD  Active Child            Recommendation:   Continue Current Plan of Care; contact PCP office for any additional needs and for medication refills, daughter to call patient states.  Follow Up Plan:   Telephone follow-up in 1 week - Week 5   Richerd Fish, RN, BSN, CCM [facility] Management Coordinator Direct Dial: [phone]           "

## 2024-06-14 ENCOUNTER — Telehealth: Payer: Self-pay | Admitting: Cardiovascular Disease

## 2024-06-14 ENCOUNTER — Other Ambulatory Visit

## 2024-06-14 DIAGNOSIS — E876 Hypokalemia: Secondary | ICD-10-CM

## 2024-06-14 NOTE — Progress Notes (Signed)
 "  06/14/2024 Name: Misty Page MRN: [id number] DOB: February 14, 1941   Misty Page is a 84 y.o. year old female who presented for a telephone visit.   They were referred to the pharmacist by the [facility] Complex Case Management program for assistance with difficulty swallowing medications tablets, namely potassium.    Subjective:  Care Team: Primary Care Provider: [name], MD   Medication Access/Adherence  Current Pharmacy:  [facility] - [place], [name] - [address] [name] [address] Phone: [phone] Fax: [phone]   Patient reports affordability concerns with their medications: No  Patient reports access/transportation concerns to their pharmacy: No  Patient reports adherence concerns with their medications:  Yes  - has not been taking potassium at any point d/t tablet size. Patient reports this comes in her pill pack and she will hand remove it.    Objective:  Lab Results  Component Value Date   HGBA1C 5.7 (H) 09/09/2023    Lab Results  Component Value Date   CREATININE 0.58 06/06/2024   BUN 16 06/06/2024   NA 138 06/06/2024   K 4.1 06/06/2024   CL 99 06/06/2024   CO2 24 06/06/2024    Lab Results  Component Value Date   CHOL 166 09/09/2023   HDL 53 09/09/2023   LDLCALC 91 09/09/2023   TRIG 125 09/09/2023   CHOLHDL 3.1 09/09/2023    Medications Reviewed Today     Reviewed by Marsh, Aalyssa Elderkin E, RPH-CPP (Pharmacist) on 06/14/24 at 1958  Med List Status: <None>   Medication Order Taking? Sig Documenting Provider Last Dose Status Informant  acetaminophen  (TYLENOL ) 500 MG tablet [id number] Yes Take 500-1,000 mg by mouth every 8 (eight) hours as needed. [provider]  Active Child, Pharmacy Records           Med Note Roscoe, RICHERD CINDERELLA Schaumann May 17, 2024  9:20 AM) Has availlable  amLODipine  (NORVASC ) 5 MG tablet [id number] Yes Take 1 tablet (5 mg total) by mouth daily. [name],  MD  Active Child  apixaban  (ELIQUIS ) 5 MG TABS tablet [id number] Yes Take 1 tablet (5 mg total) by mouth 2 (two) times daily. [name], Angela M, MD  Active Child  Ascorbic Acid  (VITAMIN C ) 1000 MG tablet [id number] Yes Take 1 tablet (1,000 mg total) by mouth every morning. [name], MD  Active Child  Bacillus Coagulans-Inulin (PROBIOTIC-PREBIOTIC) 1-250 BILLION-MG CAPS [id number] Yes Take 1 capsule by mouth daily. [name], MD  Active Child  Calcium  Carb-Cholecalciferol  (CALCIUM +D3) 600-20 MG-MCG TABS [id number] Yes Take 1 tablet by mouth daily. [name], MD  Active Child  ezetimibe  (ZETIA ) 10 MG tablet [id number] Yes Take 1 tablet (10 mg total) by mouth daily. [name], MD  Active Child  famotidine  (PEPCID ) 20 MG tablet [id number] Yes Take 1 tablet (20 mg total) by mouth 2 (two) times daily. [name], MD  Active Child  furosemide  (LASIX ) 20 MG tablet [id number] Yes Take 1 tablet (20 mg total) by mouth daily. Jhonny Calvin NOVAK, MD  Active   gabapentin  (NEURONTIN ) 300 MG capsule [id number] Yes Take 1 capsule (300 mg total) by mouth 2 (two) times daily. [name], MD  Active Child  hydrALAZINE  (APRESOLINE ) 50 MG tablet [id number] Yes TAKE ONE TABLET BY MOUTH THREE TIMES A DAY [name], [name], MD  Active   losartan  (COZAAR ) 100 MG tablet [id number] Yes Take 1 tablet (100 mg total) by mouth  daily. [name], MD  Active Child  Mesalamine  800 MG TBEC [id number] Yes Take 1 tablet (800 mg total) by mouth in the morning, at noon, and at bedtime. [name], MD  Active Child  methimazole  (TAPAZOLE ) 5 MG tablet [id number] Yes 1 Tablet on Mondays, Wednesdays and Fridays and take 1/2 tablet daily the rest of the week. [name], Angela M, MD  Active Child  potassium chloride  (KLOR-CON  M) 10 MEQ tablet [id number]  Take 1 tablet (10 mEq total) by mouth 2 (two) times daily.  Patient not taking: Reported on 06/14/2024    Sharma Coyer, MD  Active            Med Note LESLY, RICHERD CINDERELLA Debar Jun 12, 2024 12:54 PM) Patient has a central pharmacy appointment for 06/14/24 at 3 pm telephone noted.  rosuvastatin  (CRESTOR ) 10 MG tablet [id number] Yes Take 1 tablet (10 mg total) by mouth daily. [name], MD  Active Child              Assessment/Plan:   Medication Management: - Discussed that potassium chloride  can be dissolved in water for 2 minutes and then taken by mouth to help with ease of administration.  - Because patient reports there has not been a time where she was ever taking her potassium tablets, will discontinue as potassium level is in range.      Follow Up Plan:  -PCP on 07/02/24  Peyton CHARLENA Ferries, PharmD, BCACP, CPP Clinical Pharmacist [facility] [phone]    "

## 2024-06-14 NOTE — Telephone Encounter (Signed)
" °*  STAT* If patient is at the pharmacy, call can be transferred to refill team.   1. Which medications need to be refilled? (please list name of each medication and dose if known) furosemide  (LASIX ) 20 MG tablet    2. Would you like to learn more about the convenience, safety, & potential cost savings by using the [facility]? No    3. Are you open to using the [facility] (Type [facility]. No    4. Which pharmacy/location (including street and city if local pharmacy) is medication to be sent to? [facility] - [place], [place] - [address]     5. Do they need a 30 day or 90 day supply? 90 day   Pt is almost out of medication  "

## 2024-06-15 MED ORDER — FUROSEMIDE 20 MG PO TABS: 20.0000 mg | ORAL_TABLET | Freq: Every day | ORAL | 3 refills | Status: DC

## 2024-06-18 DIAGNOSIS — Z8673 Personal history of transient ischemic attack (TIA), and cerebral infarction without residual deficits: Secondary | ICD-10-CM | POA: Diagnosis not present

## 2024-06-18 DIAGNOSIS — D509 Iron deficiency anemia, unspecified: Secondary | ICD-10-CM | POA: Diagnosis not present

## 2024-06-18 DIAGNOSIS — F32A Depression, unspecified: Secondary | ICD-10-CM | POA: Diagnosis not present

## 2024-06-18 DIAGNOSIS — E7800 Pure hypercholesterolemia, unspecified: Secondary | ICD-10-CM | POA: Diagnosis not present

## 2024-06-18 DIAGNOSIS — I110 Hypertensive heart disease with heart failure: Secondary | ICD-10-CM | POA: Diagnosis not present

## 2024-06-18 DIAGNOSIS — I2510 Atherosclerotic heart disease of native coronary artery without angina pectoris: Secondary | ICD-10-CM | POA: Diagnosis not present

## 2024-06-18 DIAGNOSIS — I739 Peripheral vascular disease, unspecified: Secondary | ICD-10-CM | POA: Diagnosis not present

## 2024-06-18 DIAGNOSIS — I5033 Acute on chronic diastolic (congestive) heart failure: Secondary | ICD-10-CM | POA: Diagnosis not present

## 2024-06-18 DIAGNOSIS — I5A Non-ischemic myocardial injury (non-traumatic): Secondary | ICD-10-CM | POA: Diagnosis not present

## 2024-06-18 DIAGNOSIS — E0590 Thyrotoxicosis, unspecified without thyrotoxic crisis or storm: Secondary | ICD-10-CM | POA: Diagnosis not present

## 2024-06-18 DIAGNOSIS — K5190 Ulcerative colitis, unspecified, without complications: Secondary | ICD-10-CM | POA: Diagnosis not present

## 2024-06-18 DIAGNOSIS — I350 Nonrheumatic aortic (valve) stenosis: Secondary | ICD-10-CM | POA: Diagnosis not present

## 2024-06-19 ENCOUNTER — Telehealth: Payer: Self-pay

## 2024-06-19 ENCOUNTER — Other Ambulatory Visit: Payer: Self-pay

## 2024-06-19 DIAGNOSIS — I5033 Acute on chronic diastolic (congestive) heart failure: Secondary | ICD-10-CM

## 2024-06-19 DIAGNOSIS — J9601 Acute respiratory failure with hypoxia: Secondary | ICD-10-CM

## 2024-06-19 NOTE — Transitions of Care (Post Inpatient/ED Visit) (Signed)
 " Transition of Care Week 5 final  Visit Note  06/19/2024  Name: Misty Page MRN: [id number]          DOB: 1941-01-09  Situation: Patient enrolled in [facility] 30-day program. Visit completed with patient by telephone.   Background:   Initial Transition Care Management Follow-up Telephone Call Discharge Date and Diagnosis: 05/16/2024 Acute on chronic diastolic congestive heart failure   Past Medical History:  Diagnosis Date   Allergy    Anemia    Arthritis    CA of skin 10/15/2014   Calculus of kidney 10/15/2014   Seen in ER 06/16/10.    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   Coronary artery disease    Diverticulitis    Environmental and seasonal allergies    Fibrocystic breast disease (FCBD)    GERD (gastroesophageal reflux disease)    Heart murmur    High cholesterol    History of heart artery stent    History of kidney stones    Hyperlipidemia    Hypertension    Hyperthyroidism    Myocardial infarction [facility])    Osteomyelitis of left femur (HCC) 11/30/2018   Osteoporosis    PONV (postoperative nausea and vomiting)    Reactive depression 02/25/2019   Thyroid  disease    TIA (transient ischemic attack)     Assessment: Patient Reported Symptoms: Cognitive Cognitive Status: Able to follow simple commands, No symptoms reported, Alert and oriented to person, place, and time, Normal speech and language skills      Neurological Neurological Review of Symptoms: No symptoms reported Oher Neurological Symptoms/Conditions [RPT]: HX of left AKA - phathom sensation Neurological Management Strategies: Medical device, Medication therapy Neurological Self-Management Outcome: 4 (good)  HEENT HEENT Symptoms Reported: No symptoms reported HEENT Self-Management Outcome: 4 (good)    Cardiovascular Cardiovascular Symptoms Reported: No symptoms reported Does patient have uncontrolled Hypertension?: Yes Is patient  checking Blood Pressure at home?: No Patient's Recent BP reading at home: It's been good Cardiovascular Management Strategies: Diet modification, Medical device, Medication therapy, Routine screening Weight: 119 lb (54 kg) Cardiovascular Self-Management Outcome: 4 (good)  Respiratory Respiratory Symptoms Reported: No symptoms reported Respiratory Management Strategies: Medication therapy, Routine screening Respiratory Self-Management Outcome: 4 (good)  Endocrine Endocrine Symptoms Reported: No symptoms reported    Gastrointestinal Gastrointestinal Symptoms Reported: No symptoms reported Gastrointestinal Management Strategies: Medication therapy    Genitourinary Genitourinary Symptoms Reported: No symptoms reported Genitourinary Management Strategies: Incontinence garment/pad Genitourinary Self-Management Outcome: 4 (good)  Integumentary Integumentary Symptoms Reported: No symptoms reported Skin Management Strategies: Activity, Routine screening  Musculoskeletal Musculoskelatal Symptoms Reviewed: Limited mobility, Unsteady gait, Weakness Other Musculoskeletal Symptoms: Left AKA at the hip/pelvis Musculoskeletal Management Strategies: Activity, Medical device, Medication therapy, Routine screening      Psychosocial Psychosocial Symptoms Reported: No symptoms reported Additional Psychological Details: I'm doing good Behavioral Health Self-Management Outcome: 4 (good)       Today's Vitals   06/19/24 1033  Weight: 119 lb (54 kg)   Pain Scale: 0-10 Pain Score: 0-No pain Pain Type: Phantom pain (I forget to call this as pain as because it's always like a part of me now)  Medications Reviewed Today     Reviewed by Eilleen Richerd GRADE, RN (Registered Nurse) on 06/19/24 at 1040  Med List Status: <None>   Medication Order Taking? Sig Documenting Provider Last Dose Status Informant  acetaminophen  (TYLENOL ) 500 MG tablet [id number] Yes Take 500-1,000 mg by  mouth every 8 (eight) hours  as needed. [provider]  Active Child, Pharmacy Records           Med Note Kane, RICHERD CINDERELLA Schaumann May 17, 2024  9:20 AM) Has availlable  amLODipine  (NORVASC ) 5 MG tablet [id number] Yes Take 1 tablet (5 mg total) by mouth daily. [name], MD  Active Child  apixaban  (ELIQUIS ) 5 MG TABS tablet [id number] Yes Take 1 tablet (5 mg total) by mouth 2 (two) times daily. [name], Angela M, MD  Active Child  Ascorbic Acid  (VITAMIN C ) 1000 MG tablet [id number] Yes Take 1 tablet (1,000 mg total) by mouth every morning. [name], MD  Active Child  Bacillus Coagulans-Inulin (PROBIOTIC-PREBIOTIC) 1-250 BILLION-MG CAPS [id number] Yes Take 1 capsule by mouth daily. [name], MD  Active Child  Calcium  Carb-Cholecalciferol  (CALCIUM +D3) 600-20 MG-MCG TABS [id number] Yes Take 1 tablet by mouth daily. [name], MD  Active Child  ezetimibe  (ZETIA ) 10 MG tablet [id number] Yes Take 1 tablet (10 mg total) by mouth daily. [name], MD  Active Child  famotidine  (PEPCID ) 20 MG tablet [id number] Yes Take 1 tablet (20 mg total) by mouth 2 (two) times daily. [name], MD  Active Child  furosemide  (LASIX ) 20 MG tablet [id number] Yes Take 1 tablet (20 mg total) by mouth daily. Darron Deatrice LABOR, MD  Active   gabapentin  (NEURONTIN ) 300 MG capsule [id number] Yes Take 1 capsule (300 mg total) by mouth 2 (two) times daily. [name], Angela M, MD  Active Child  hydrALAZINE  (APRESOLINE ) 50 MG tablet [id number] Yes TAKE ONE TABLET BY MOUTH THREE TIMES A DAY [name], [name], MD  Active   losartan  (COZAAR ) 100 MG tablet [id number] Yes Take 1 tablet (100 mg total) by mouth daily. [name], MD  Active Child  Mesalamine  800 MG TBEC [id number] Yes Take 1 tablet (800 mg total) by mouth in the morning, at noon, and at bedtime. [name], MD  Active Child  methimazole  (TAPAZOLE ) 5 MG tablet [id number] Yes 1 Tablet on Mondays, Wednesdays and  Fridays and take 1/2 tablet daily the rest of the week. [name], MD  Active Child  rosuvastatin  (CRESTOR ) 10 MG tablet [id number] Yes Take 1 tablet (10 mg total) by mouth daily. [name], MD  Active Child           Care gaps were reviewed with patient today and patient verbalized understanding to follow up with primary care provider during office visits and discuss any concerns.    Recommendation:   Referral to: Longitudinal RN for complex care management. Centralized pharmacist as needed.  Follow Up Plan:   Referral to RN Case Manager Pharmacist Patient has met all care management goals. Care Management case will be closed. Patient has been provided contact information should new needs arise.   Richerd Fish, RN, BSN, CCM [facility], [facility] RN Care Manager Direct Dial: [phone]           "

## 2024-06-19 NOTE — Patient Instructions (Signed)
 Visit Information  Thank you for taking time to visit with me today. Please don't hesitate to contact me if I can be of assistance to you before our next scheduled telephone appointment.  Our next appointment is referred to longitudinal CCM team.  Following is a copy of your care plan:   Goals Addressed             This Visit's Progress    [facility] Transitions of Care (TOC) Care Plan   On track    Problems:  Recent Hospitalization for treatment of CHF Equipment/DME barrier scale, Knowledge Deficit Related to reason for BP and weights daily at the same time reviewed, and No Hospital Follow Up Provider appointment patient desires to keep appointment for 06/11/24 and offered to assist with getting a closer appointment however adamantly declines at this time Patient is unable to take her Potassium Chloride , states pills are too big causing gagging to the point of vomiting, states, I will take with Dr. B about it in January and I'm not going to worry with it until then. 06/04/24 Ongoing follow up needed with Potassium pills, although pills changed to 10 mEq twice a day on 06/01/2024 patient still prefers to follow up with PCP on 06/11/24 or Cardiologist on 06/06/24 regarding not taking it and if needed still to have it in powdered form.  Follow up appointment cancelled by PCP until 07/02/24. Patient has an appointment with Centralized Pharmacy on 06/14/24 at 1500 reminded patient of appointment, for telephone appointment. 06/19/24 Reviewed progress, pharmacist follow up completed and no longer on Potassium states her labs were good  Goal:  Over the next 30 days, the patient will not experience hospital readmission  Interventions:   Heart Failure Interventions: Provided education on low sodium diet Assessed need for readable accurate scales in home Provided education about placing scale on hard, flat surface Advised patient to weigh each morning after emptying bladder Discussed importance of daily  weight and advised patient to weigh and record daily Reviewed role of diuretics in prevention of fluid overload and management of heart failure; Discussed the importance of keeping all appointments with provider Advised patient to discuss Potassium Chloride  prescription due to inability to keep in down and unable to swallow - patient states she would discuss with provider Screening for signs and symptoms of depression related to chronic disease state  Assessed social determinant of health barriers  05/22/24 Reviewed importance of potassium and patient wanted review of foods that assist with them, Reviewed heart heathy diet and foods that have good potassium and heart healthy such as sweet potatoes, zucchini, beans, lean proteins, reminded for low sodium as well over the holiday. 06/04/24 - Ongoing encouragement for monitoring for HF; states continues to follow up with providers but feels she is doing well right now. 06/12/24 Reviewed progress and care plan, patient verbalized not taking Potassium and to discuss with pharmacist on 06/14/24 for alternative stating her last potassium level was fine. Encouraged ongoing follow up and has labs for 06/20/24 appointment.  Patient Self Care Activities:  Attend all scheduled provider appointments Call pharmacy for medication refills 3-7 days in advance of running out of medications Call provider office for new concerns or questions  Notify RN Care Manager of TOC call rescheduling needs Participate in Transition of Care Program/Attend TOC scheduled calls Take medications as prescribed    Plan:  The patient has been provided with contact information for the care management team and has been advised to call with any health  related questions or concerns.  Discussed and offered 30 day TOC program.  Patient agrees to weekly telephonic calls.  The patient has been provided with contact information for the care management team and has been advised to call with any  health -related questions or concerns.  The patient verbalized understanding with current plan of care.  The patient is directed to their insurance card regarding availability of benefits coverage.   05/22/24 Discussed patient's action plan for HF and patient states she is spending the holidays with her daughter and wishes follow up after the new year. Discussed who to contact and take her providers information with her with the AVS or DC sheets as well.  Patient verbalized understanding using teach back method.   06/04/24 - Continue care plan - next appointment next Tuesday 06/12/24 after upcoming appointments to track progress and needs. 06/12/24 - Plan week 5 follow up, since PCP appointment is now 07/02/24  06/19/24 Patient has completed 30 day TOC program discussed longitudinal follow up and agrees. Will refer to longitudinal RN and centralized pharmacist for needs.         Patient verbalizes understanding of instructions and care plan provided today and agrees to view in MyChart. Active MyChart status and patient understanding of how to access instructions and care plan via MyChart confirmed with patient.    Patient states her daughter Carlyon stays on it and keeps her appointments and messages.  The patient has been provided with contact information for the care management team and has been advised to call with any health related questions or concerns.   Please call the care guide team at [phone] if you need to cancel or reschedule your appointment.   Please call the [place]  National Suicide Prevention Lifeline: 938-250-7031 or TTY: 979-484-5456 TTY [phone]) to talk to a trained counselor if you are experiencing a Mental Health or Behavioral Health Crisis or need someone to talk to.  Richerd Fish, RN, BSN, CCM [facility], [facility] RN Care Manager Direct Dial: [phone]

## 2024-06-20 ENCOUNTER — Telehealth: Payer: Self-pay

## 2024-06-20 MED ORDER — FUROSEMIDE 20 MG PO TABS: 20.0000 mg | ORAL_TABLET | Freq: Every day | ORAL | 1 refills | Status: AC

## 2024-06-20 NOTE — Progress Notes (Signed)
 Complex Care Management Note  Care Guide Note 06/20/2024 Name: Misty Page MRN: [id number] DOB: 1940-09-16  Misty Page is a 84 y.o. year old female who sees [name], [name], MD for primary care. I reached out to Niels LELON Dawn by phone today to offer complex care management services.  Ms. Danser was given information about Complex Care Management services today including:   The Complex Care Management services include support from the care team which includes your Nurse Care Manager, Clinical Social Worker, or Pharmacist.  The Complex Care Management team is here to help remove barriers to the health concerns and goals most important to you. Complex Care Management services are voluntary, and the patient may decline or stop services at any time by request to their care team member.   Complex Care Management Consent Status: Patient agreed to services and verbal consent obtained.   Follow up plan:  07/12/24 @ 1 PM  Encounter Outcome:  Patient Scheduled  Leotis Rase [facility], [facility] Guide  Direct Dial: [phone]  Fax [phone]

## 2024-06-20 NOTE — Telephone Encounter (Signed)
 Pt's medication was sent to pt's pharmacy as requested. Confirmation received.

## 2024-07-02 ENCOUNTER — Ambulatory Visit: Admitting: Family Medicine

## 2024-07-02 DIAGNOSIS — I10 Essential (primary) hypertension: Secondary | ICD-10-CM

## 2024-07-02 DIAGNOSIS — E7800 Pure hypercholesterolemia, unspecified: Secondary | ICD-10-CM

## 2024-07-05 ENCOUNTER — Other Ambulatory Visit: Payer: Self-pay | Admitting: Family Medicine

## 2024-07-05 DIAGNOSIS — M810 Age-related osteoporosis without current pathological fracture: Secondary | ICD-10-CM

## 2024-07-09 ENCOUNTER — Ambulatory Visit: Admitting: Family Medicine

## 2024-07-12 ENCOUNTER — Telehealth

## 2024-07-16 ENCOUNTER — Encounter (INDEPENDENT_AMBULATORY_CARE_PROVIDER_SITE_OTHER): Payer: PPO

## 2024-07-16 ENCOUNTER — Ambulatory Visit (INDEPENDENT_AMBULATORY_CARE_PROVIDER_SITE_OTHER): Payer: PPO | Admitting: Vascular Surgery

## 2024-09-12 ENCOUNTER — Ambulatory Visit: Admitting: Physician Assistant

## 2024-11-22 ENCOUNTER — Ambulatory Visit: Admitting: Physician Assistant

## 2024-11-23 ENCOUNTER — Ambulatory Visit: Admitting: Physician Assistant

## 2025-01-01 ENCOUNTER — Ambulatory Visit
